# Patient Record
Sex: Female | Born: 1940
Health system: Southern US, Community
[De-identification: ages and names within clinical notes are randomized; demographics above are authoritative.]

## PROBLEM LIST (undated history)

## (undated) DIAGNOSIS — J449 Chronic obstructive pulmonary disease, unspecified: Secondary | ICD-10-CM

## (undated) DIAGNOSIS — F419 Anxiety disorder, unspecified: Secondary | ICD-10-CM

## (undated) DIAGNOSIS — J309 Allergic rhinitis, unspecified: Secondary | ICD-10-CM

## (undated) DIAGNOSIS — L0390 Cellulitis, unspecified: Secondary | ICD-10-CM

## (undated) DIAGNOSIS — M779 Enthesopathy, unspecified: Secondary | ICD-10-CM

## (undated) DIAGNOSIS — K589 Irritable bowel syndrome without diarrhea: Secondary | ICD-10-CM

## (undated) DIAGNOSIS — I6529 Occlusion and stenosis of unspecified carotid artery: Secondary | ICD-10-CM

## (undated) DIAGNOSIS — J4489 Other specified chronic obstructive pulmonary disease: Secondary | ICD-10-CM

## (undated) DIAGNOSIS — G301 Alzheimer's disease with late onset: Secondary | ICD-10-CM

## (undated) DIAGNOSIS — Q525 Fusion of labia: Secondary | ICD-10-CM

## (undated) DIAGNOSIS — I70209 Unspecified atherosclerosis of native arteries of extremities, unspecified extremity: Secondary | ICD-10-CM

## (undated) DIAGNOSIS — F329 Major depressive disorder, single episode, unspecified: Secondary | ICD-10-CM

## (undated) DIAGNOSIS — E7800 Pure hypercholesterolemia, unspecified: Secondary | ICD-10-CM

## (undated) DIAGNOSIS — Z87440 Personal history of urinary (tract) infections: Secondary | ICD-10-CM

## (undated) DIAGNOSIS — E039 Hypothyroidism, unspecified: Secondary | ICD-10-CM

## (undated) DIAGNOSIS — M1712 Unilateral primary osteoarthritis, left knee: Secondary | ICD-10-CM

## (undated) DIAGNOSIS — R718 Other abnormality of red blood cells: Secondary | ICD-10-CM

## (undated) DIAGNOSIS — H9190 Unspecified hearing loss, unspecified ear: Secondary | ICD-10-CM

## (undated) DIAGNOSIS — R87629 Unspecified abnormal cytological findings in specimens from vagina: Secondary | ICD-10-CM

## (undated) DIAGNOSIS — E079 Disorder of thyroid, unspecified: Secondary | ICD-10-CM

## (undated) DIAGNOSIS — N760 Acute vaginitis: Secondary | ICD-10-CM

## (undated) DIAGNOSIS — K219 Gastro-esophageal reflux disease without esophagitis: Secondary | ICD-10-CM

## (undated) DIAGNOSIS — F32A Depression, unspecified: Secondary | ICD-10-CM

## (undated) DIAGNOSIS — M1990 Unspecified osteoarthritis, unspecified site: Secondary | ICD-10-CM

## (undated) DIAGNOSIS — E119 Type 2 diabetes mellitus without complications: Secondary | ICD-10-CM

## (undated) DIAGNOSIS — M8580 Other specified disorders of bone density and structure, unspecified site: Secondary | ICD-10-CM

## (undated) DIAGNOSIS — G4700 Insomnia, unspecified: Secondary | ICD-10-CM

## (undated) DIAGNOSIS — M109 Gout, unspecified: Secondary | ICD-10-CM

## (undated) DIAGNOSIS — I10 Essential (primary) hypertension: Secondary | ICD-10-CM

## (undated) DIAGNOSIS — R55 Syncope and collapse: Secondary | ICD-10-CM

## (undated) DIAGNOSIS — M3500 Sicca syndrome, unspecified: Secondary | ICD-10-CM

## (undated) DIAGNOSIS — Z9289 Personal history of other medical treatment: Secondary | ICD-10-CM

## (undated) HISTORY — DX: Gastro-esophageal reflux disease without esophagitis: K21.9

## (undated) HISTORY — DX: Occlusion and stenosis of unspecified carotid artery: I65.29

## (undated) HISTORY — PX: TONSILLECTOMY: SUR1361

## (undated) HISTORY — DX: Syncope and collapse: R55

## (undated) HISTORY — DX: Irritable bowel syndrome, unspecified: K58.9

## (undated) HISTORY — DX: Allergic rhinitis, unspecified: J30.9

## (undated) HISTORY — DX: Unilateral primary osteoarthritis, left knee: M17.12

## (undated) HISTORY — DX: Disorder of thyroid, unspecified: E07.9

## (undated) HISTORY — DX: Enthesopathy, unspecified: M77.9

## (undated) HISTORY — PX: CATARACT EXTRACTION, BILATERAL: SHX1313

## (undated) HISTORY — DX: Depression, unspecified: F32.A

## (undated) HISTORY — DX: Other specified disorders of bone density and structure, unspecified site: M85.80

## (undated) HISTORY — DX: Acute vaginitis: N76.0

## (undated) HISTORY — DX: Hypothyroidism, unspecified: E03.9

## (undated) HISTORY — DX: Unspecified atherosclerosis of native arteries of extremities, unspecified extremity: I70.209

## (undated) HISTORY — DX: Chronic obstructive pulmonary disease, unspecified: J44.9

## (undated) HISTORY — DX: Other specified chronic obstructive pulmonary disease: J44.89

## (undated) HISTORY — DX: Personal history of urinary (tract) infections: Z87.440

## (undated) HISTORY — DX: Unspecified osteoarthritis, unspecified site: M19.90

## (undated) HISTORY — PX: CHOLECYSTECTOMY: SHX55

## (undated) HISTORY — DX: Fusion of labia: Q52.5

## (undated) HISTORY — PX: EYE SURGERY: SHX253

## (undated) HISTORY — DX: Anxiety disorder, unspecified: F41.9

## (undated) HISTORY — DX: Sjogren syndrome, unspecified: M35.00

## (undated) HISTORY — DX: Gout, unspecified: M10.9

## (undated) HISTORY — DX: Pure hypercholesterolemia, unspecified: E78.00

## (undated) HISTORY — DX: Unspecified abnormal cytological findings in specimens from vagina: R87.629

## (undated) HISTORY — DX: Major depressive disorder, single episode, unspecified: F32.9

## (undated) HISTORY — DX: Cellulitis, unspecified: L03.90

## (undated) HISTORY — DX: Other abnormality of red blood cells: R71.8

## (undated) HISTORY — DX: Essential (primary) hypertension: I10

## (undated) HISTORY — DX: Insomnia, unspecified: G47.00

## (undated) HISTORY — PX: FOOT SURGERY: SHX648

## (undated) HISTORY — PX: KNEE SURGERY: SHX244

## (undated) HISTORY — DX: Type 2 diabetes mellitus without complications: E11.9

---

## 1998-11-21 ENCOUNTER — Ambulatory Visit (HOSPITAL_COMMUNITY): Admission: RE | Admit: 1998-11-21 | Discharge: 1998-11-21 | Payer: Self-pay | Admitting: Obstetrics & Gynecology

## 1999-01-09 ENCOUNTER — Other Ambulatory Visit: Admission: RE | Admit: 1999-01-09 | Discharge: 1999-01-09 | Payer: Self-pay | Admitting: Obstetrics and Gynecology

## 1999-02-10 ENCOUNTER — Other Ambulatory Visit: Admission: RE | Admit: 1999-02-10 | Discharge: 1999-02-10 | Payer: Self-pay | Admitting: Obstetrics and Gynecology

## 1999-11-19 ENCOUNTER — Encounter: Payer: Self-pay | Admitting: Internal Medicine

## 1999-11-19 ENCOUNTER — Encounter: Admission: RE | Admit: 1999-11-19 | Discharge: 1999-11-19 | Payer: Self-pay | Admitting: Internal Medicine

## 2000-03-03 ENCOUNTER — Other Ambulatory Visit: Admission: RE | Admit: 2000-03-03 | Discharge: 2000-03-03 | Payer: Self-pay | Admitting: Obstetrics and Gynecology

## 2000-05-10 ENCOUNTER — Encounter: Admission: RE | Admit: 2000-05-10 | Discharge: 2000-08-08 | Payer: Self-pay | Admitting: Internal Medicine

## 2001-01-02 ENCOUNTER — Encounter: Admission: RE | Admit: 2001-01-02 | Discharge: 2001-04-02 | Payer: Self-pay | Admitting: Internal Medicine

## 2001-03-13 ENCOUNTER — Other Ambulatory Visit: Admission: RE | Admit: 2001-03-13 | Discharge: 2001-03-13 | Payer: Self-pay | Admitting: Obstetrics and Gynecology

## 2001-03-13 ENCOUNTER — Encounter (INDEPENDENT_AMBULATORY_CARE_PROVIDER_SITE_OTHER): Payer: Self-pay | Admitting: Specialist

## 2002-05-14 ENCOUNTER — Other Ambulatory Visit: Admission: RE | Admit: 2002-05-14 | Discharge: 2002-05-14 | Payer: Self-pay | Admitting: Obstetrics and Gynecology

## 2002-06-15 ENCOUNTER — Ambulatory Visit (HOSPITAL_COMMUNITY): Admission: RE | Admit: 2002-06-15 | Discharge: 2002-06-15 | Payer: Self-pay | Admitting: Internal Medicine

## 2003-05-16 ENCOUNTER — Other Ambulatory Visit: Admission: RE | Admit: 2003-05-16 | Discharge: 2003-05-16 | Payer: Self-pay | Admitting: Obstetrics and Gynecology

## 2004-05-18 ENCOUNTER — Other Ambulatory Visit: Admission: RE | Admit: 2004-05-18 | Discharge: 2004-05-18 | Payer: Self-pay | Admitting: Obstetrics and Gynecology

## 2004-07-07 ENCOUNTER — Encounter (HOSPITAL_BASED_OUTPATIENT_CLINIC_OR_DEPARTMENT_OTHER): Admission: RE | Admit: 2004-07-07 | Discharge: 2004-10-05 | Payer: Self-pay | Admitting: Internal Medicine

## 2004-12-08 ENCOUNTER — Ambulatory Visit (HOSPITAL_COMMUNITY): Admission: RE | Admit: 2004-12-08 | Discharge: 2004-12-08 | Payer: Self-pay | Admitting: Oncology

## 2005-06-17 ENCOUNTER — Other Ambulatory Visit: Admission: RE | Admit: 2005-06-17 | Discharge: 2005-06-17 | Payer: Self-pay | Admitting: Obstetrics and Gynecology

## 2005-10-06 ENCOUNTER — Ambulatory Visit: Payer: Self-pay | Admitting: Pulmonary Disease

## 2005-11-05 ENCOUNTER — Ambulatory Visit: Payer: Self-pay | Admitting: Pulmonary Disease

## 2006-02-22 ENCOUNTER — Ambulatory Visit: Payer: Self-pay | Admitting: Pulmonary Disease

## 2006-03-09 ENCOUNTER — Ambulatory Visit: Payer: Self-pay | Admitting: Pulmonary Disease

## 2007-05-10 ENCOUNTER — Encounter: Admission: RE | Admit: 2007-05-10 | Discharge: 2007-05-10 | Payer: Self-pay | Admitting: Family Medicine

## 2007-05-11 ENCOUNTER — Other Ambulatory Visit: Admission: RE | Admit: 2007-05-11 | Discharge: 2007-05-11 | Payer: Self-pay | Admitting: Obstetrics and Gynecology

## 2009-03-18 ENCOUNTER — Emergency Department (HOSPITAL_COMMUNITY): Admission: EM | Admit: 2009-03-18 | Discharge: 2009-03-18 | Payer: Self-pay | Admitting: Emergency Medicine

## 2009-04-18 ENCOUNTER — Encounter (HOSPITAL_BASED_OUTPATIENT_CLINIC_OR_DEPARTMENT_OTHER): Admission: RE | Admit: 2009-04-18 | Discharge: 2009-05-09 | Payer: Self-pay | Admitting: Internal Medicine

## 2009-06-23 ENCOUNTER — Ambulatory Visit: Payer: Self-pay | Admitting: Obstetrics and Gynecology

## 2009-06-23 ENCOUNTER — Other Ambulatory Visit: Admission: RE | Admit: 2009-06-23 | Discharge: 2009-06-23 | Payer: Self-pay | Admitting: Obstetrics and Gynecology

## 2009-06-23 ENCOUNTER — Encounter: Payer: Self-pay | Admitting: Obstetrics and Gynecology

## 2009-06-30 ENCOUNTER — Ambulatory Visit: Payer: Self-pay | Admitting: Obstetrics and Gynecology

## 2009-07-17 ENCOUNTER — Ambulatory Visit: Payer: Self-pay | Admitting: Obstetrics and Gynecology

## 2010-02-26 ENCOUNTER — Ambulatory Visit: Payer: Self-pay | Admitting: Obstetrics and Gynecology

## 2010-03-12 ENCOUNTER — Ambulatory Visit: Payer: Self-pay | Admitting: Obstetrics and Gynecology

## 2010-03-19 ENCOUNTER — Ambulatory Visit: Payer: Self-pay | Admitting: Obstetrics and Gynecology

## 2010-05-18 ENCOUNTER — Ambulatory Visit: Payer: Self-pay | Admitting: Women's Health

## 2010-06-24 ENCOUNTER — Ambulatory Visit: Payer: Self-pay | Admitting: Obstetrics and Gynecology

## 2010-07-16 ENCOUNTER — Ambulatory Visit: Payer: Self-pay | Admitting: Obstetrics and Gynecology

## 2010-08-20 ENCOUNTER — Ambulatory Visit: Payer: Self-pay | Admitting: Obstetrics and Gynecology

## 2011-01-22 ENCOUNTER — Ambulatory Visit (INDEPENDENT_AMBULATORY_CARE_PROVIDER_SITE_OTHER): Payer: Medicare Other | Admitting: Obstetrics and Gynecology

## 2011-01-22 DIAGNOSIS — R350 Frequency of micturition: Secondary | ICD-10-CM

## 2011-01-22 DIAGNOSIS — N898 Other specified noninflammatory disorders of vagina: Secondary | ICD-10-CM

## 2011-01-22 DIAGNOSIS — R82998 Other abnormal findings in urine: Secondary | ICD-10-CM

## 2011-01-22 DIAGNOSIS — B373 Candidiasis of vulva and vagina: Secondary | ICD-10-CM

## 2011-02-26 ENCOUNTER — Other Ambulatory Visit: Payer: Medicare Other

## 2011-03-16 ENCOUNTER — Other Ambulatory Visit (INDEPENDENT_AMBULATORY_CARE_PROVIDER_SITE_OTHER): Payer: Medicare Other

## 2011-03-16 DIAGNOSIS — N390 Urinary tract infection, site not specified: Secondary | ICD-10-CM

## 2011-03-31 ENCOUNTER — Other Ambulatory Visit: Payer: Self-pay | Admitting: Internal Medicine

## 2011-03-31 DIAGNOSIS — M5416 Radiculopathy, lumbar region: Secondary | ICD-10-CM

## 2011-04-05 ENCOUNTER — Ambulatory Visit
Admission: RE | Admit: 2011-04-05 | Discharge: 2011-04-05 | Disposition: A | Discharge status: Home / Self Care | Payer: Medicare Other | Source: Ambulatory Visit | Attending: Internal Medicine | Admitting: Internal Medicine

## 2011-04-05 DIAGNOSIS — M5416 Radiculopathy, lumbar region: Secondary | ICD-10-CM

## 2011-04-20 NOTE — Consult Note (Signed)
NAME:  Christina Sellers, Christina Sellers NO.:  [id number]   MEDICAL RECORD NO.:  [id number]          PATIENT TYPE:  REC   LOCATION:  FOOT                         FACILITY:  [facility]   PHYSICIAN:  Barry Dienes. Eloise Harman, M.D.DATE OF BIRTH:  Sep 20, 1941   DATE OF CONSULTATION:  DATE OF DISCHARGE:                                 CONSULTATION   INDICATION FOR CONSULTATION:  Persistent ulcers on the left leg.   HISTORY OF PRESENT ILLNESS:  The patient is a 70 year old white woman  with a fairly unremarkable medical history who tripped while going  upstairs approximately 5 weeks ago sustaining abrasions to the anterior  mid left leg.  She was initially seen at [facility] where they  butterfly taped the skin tear and prescribed a short course of  doxycycline.  Despite this and applying gauze to the wound once daily,  she had a moderate amount of oozing with foul odor.  She had some  tenderness around this area, but no fever or chills.  She was seen by  her primary care physician who extended the course of doxycycline and  continued topical wound care.  She has no history of DVT and no known  history of atherosclerotic peripheral vascular disease or tobacco abuse.   PAST MEDICAL HISTORY:  1. Hypertension.  2. Hypothyroidism.  3. Depression.  4. Hyperlipidemia.  5. Osteoarthritis.   ALLERGIES:  No known drug allergies.   PAST SURGICAL HISTORY:  In 1946, tonsillectomy; in 1980, open  cholecystectomy; in 2004, arthroscopy; and in 2008, bilateral cataract  operations.   MEDICATIONS AT THE TIME OF VISIT:  1. Diovan HCT 160/12.5 one tablets p.o. daily.  2. Lexapro 5 mg p.o. daily.  3. Crestor 5 mg p.o. daily.  4. Levothyroxine 100 mcg p.o. daily.  5. Voltaren 75 mg p.o. daily p.r.n. pain.   FAMILY HISTORY:  Noncontributory.   SOCIAL HISTORY:  She is widowed and has no history of tobacco abuse.  She has approximately two glasses of wine per evening.   REVIEW OF SYSTEMS:  She has not  had fever or chills.  She has chronic  mild dyspnea on exertion with exercise.  She denies productive cough or  substernal chest pain or palpitations or GI system problems.  Again, she  has no history of DVT or pulmonary embolism.   INITIAL PHYSICAL EXAMINATION:  VITAL SIGNS:  Blood pressure 102/65,  pulse 61, respirations 18, and temperature 97.5.  GENERAL:  She is a mildly overweight white woman who was in no apparent  distress while sitting upright.  HEENT:  Head, eyes, ears, nose, and throat exam was within normal  limits.  NECK:  Supple without jugular venous distention or carotid bruit.  CHEST:  Clear to auscultation.  HEART:  Regular rate and rhythm without significant murmur or gallop.  ABDOMEN:  Normal bowel sounds and no tenderness.  EXTREMITIES:  She had bilateral trace ankle edema.  She had prominent  superficial varicose veins in both legs and along the anterolateral and  proximal left leg, there was a rubber-like consistency subcutaneous lump  that seemed most likely due to superficial venous thrombosis.  On the  medial aspect of her left leg and thigh, there were no palpable venous  cords and no tenderness with palpation.  The left pedal pulses were not  palpable.  She had ABI ultrasound exam done, which returned at 0.97 on  the left and 1.03 on the right.  SKIN:  Significant for two ulcers on the anterior left leg.  Wound #1 is the more proximal left leg ulcer on the anterior aspect.  It  measures 0.2 cm x 0.2 cm x 0.1 cm.  This ulcer does not have significant  eschar but has minimal necrotic debris with a red wound base and pink  granulation tissue.  There was no exposed bone, tendon, or muscle and  mild odor.  There was no significant drainage.  There was mild erythema  around the wound.  Wound #2:  This is located on the distal and anterior left leg and  measures 0.9 cm x 0.4 cm x 0.1 cm.  This wound does not have significant  eschar but has minimal necrotic debris  with a red wound base and pink  granulation tissue.  There was no exposed bone, tendon, or muscle.  There was mild odor and scant serous discharge with intact periwound  integrity.   ASSESSMENT:  Slow-healing small ulcers on the anterior left leg.  It  appears that the slowed healing his due to mixed vascular disease  including some degree of atherosclerotic arterial peripheral vascular  disease as well as chronic venous insufficiency from incontinent venous  drainage.  I am a bit reluctant to apply tight compression wrap to her  leg despite the results on the ABI exam as she likely has small vessel  disease.   PLAN:  After the ulcers were cleaned with soap and water, 5% lidocaine  gel was applied to them to obtain topical anesthesia.  A #15 scalpel  blade was used to gently debride the necrotic tissue from the ulcers.  This was accomplished without significant discomfort for the patient or  bleeding.  The minimal bleeding was stopped with local pressure.  Following this, we applied silver-coated dressing covered by Anola Gurney  covered by a bulky gauze dressing.  Around the ulcers, a zinc-based  protective ointment was applied.  We will plan on changing the dressings  every Friday by the patient and every Tuesday here.  We have requested a  physician reevaluation in approximately 1 week.           ______________________________  Barry Dienes. Eloise Harman, M.D.     DGP/MEDQ  D:  04/22/2009  T:  04/23/2009  Job:  [id number]   cc:   Candyce Churn, M.D.

## 2011-04-20 NOTE — Assessment & Plan Note (Signed)
[facility]   NAME:  Christina Sellers, Christina Sellers           ACCOUNT NO.:  [id number]   MEDICAL RECORD NO.:  [id number]      DATE OF BIRTH:  20-Aug-1941   PHYSICIAN:  Barry Dienes. Eloise Harman, M.D.     VISIT DATE:                                   OFFICE VISIT   SUBJECTIVE:  The patient is a 70 year old Caucasian woman who is seen  for reevaluation of persistent ulcers on the anterior left leg after an  injury associated with tripping.  At her last visit, these ulcers were  debrided and she began treatment with silver coated dressing, covered by  Anola Gurney, covered by bulky dressing changed every Friday by the patient  and every Tuesday here.  She has not had significant drainage on these  dressings and has not had significant pain in the leg.  She has not had  any fever or chills.   OBJECTIVE:  VITAL SIGNS:  Blood pressure 143/71, pulse 73, respirations  21, temperature 97.7.   The condition of the wounds is as follows:  Wound #1:  It is located on the proximal anterior left leg and measures  0.1 cm x 0.1 cm x 0.1 cm.  This wound does have partial coverage with  eschar and minimal necrotic debris.  The wound base is red in color with  some pink granulation tissue.  There is no exposed bone, tendon, or  muscle and no foul odor.  There was a small amount of serous drainage  and an intact periwound integrity.  Wound #2:  This is located on the distal anterior left leg and measures  0.5 cm x 0.3 cm x 0.1 cm.  This wound is fully covered with eschar and  has minimal necrotic debris.  The wound base is red in color and there  was no significant granulation tissue.  There was no exposed bone,  tendon, or muscle and no foul odor.  There was no significant drainage.  The periwound integrity was intact.   ASSESSMENT:  Interval near-complete resolution of traumatic ulcers on  the left leg.  There remains a small amount of necrotic tissue with  eschar.  In the past, it was quite difficult  to obtain adequate  anesthesia for debridement.  Her leg has somewhat cool skin and  nonpalpable pedal pulses.  She likely has some underlying  atherosclerotic peripheral vascular disease in addition to venous  insufficiency.   PLAN:  A #15 scalpel blade was used to gently debride the necrotic  tissue around the eschar.  This was accomplished without significant  bleeding and only minimal discomfort.  I have advised her to continue  treatment to these eschars with soap and water daily, covered by either  a Band-Aid or nonstick gauze.  She was advised to call us if her wound  does not gradually heal over the next 4-8 weeks.  She states that she is  considering bunion surgery on the left and I advised her that should she  do this, that it would be in her best interest to have an evaluation of  her arterial blood flow prior to this type of elective surgery such as  an ankle toe arterial ultrasound index.           ______________________________  Barry Dienes  Eloise Harman, M.D.     DGP/MEDQ  D:  04/29/2009  T:  04/30/2009  Job:  [id number]   cc:   Candyce Churn, M.D.

## 2011-04-23 NOTE — Consult Note (Signed)
NAME:  Christina Sellers, Christina Sellers NO.:  [id number]   MEDICAL RECORD NO.:  [id number]                   PATIENT TYPE:  REC   LOCATION:  FOOT                                 FACILITY:  [facility]   PHYSICIAN:  Jonelle Sports. Sevier, M.D.              DATE OF BIRTH:  02/11/1941   DATE OF CONSULTATION:  07/09/2004  DATE OF DISCHARGE:                                   CONSULTATION   HISTORY:  This 70 year old white female was referred at the courtesy of Dr.  Thurston Hole for assistance with management of a chronic traumatic wound to the  left lower extremity.   The patient has in the past been told of some circulatory insufficiency in  her lower extremities, but this has never been a point of any clinical  concern. On June 22, she was visiting at [facility] and apparently fell,  injuring her mid pretibial area on the left against the edge of a concrete  step. There was no concern apparently about any foreign body having entered  the wound. Subsequent x-ray done by Dr. Thurston Hole approximately a week ago  showed no bone injury, no suspicion of osteomyelitis, and no foreign bodies.  She was treated initially with Neosporin and bandaged and was given a round  of Spectracef for five days in mid July. One week ago, she saw Dr. Thurston Hole  for the problem, and her dressing was changed to a wet-to-dry normal saline  on b.i.d. basis, and she was given Omnicef 300 mg twice daily which was  completed on the day prior to her visit here. She has continued to have  considerable pain and some pinprick sensation in the area.   She was referred here by Dr. Thurston Hole that we might provide continued  surveillance until this wound heels.   PAST MEDICAL HISTORY:  The patient also has hypertension. She has had a  cholecystectomy and left knee surgery in the past.   ALLERGIES:  She is allergic to CELEXA.   MEDICATIONS:  Her regular medications include:  1. Diovan HCT.  2. Claritin.  3. Flonase.  4.  Prilosec.  5. Voltaren SR.  6. Baby aspirin.  7. Advair.  8. Glucosamine.  9. Vitamin E.  10.      Calcium carbonate.   PHYSICAL EXAMINATION:  Examination today is limited ot the distal lower  extremities. The patient's right lower extremity is largely unremarkable  except for slightly diminished pulses and will not further described. The  left lower extremity is also largely unremarkable with no significant  deformities with pulses everywhere palpable though diminished with intact  sensation to monofilament testing and with skin temperature which are  normal. There is no significant edema.   On that left side in the mid to distal pretibial area is a healing area of  skin avulsion which is roughly superficial and which measures 20 x 10 x 0.5  mm. The base of this is  reasonably granular and clean, and there is some  surrounding hardened debris secondary to seepage of the wound.   IMPRESSION:  Traumatic lesion, left pretibial area, healing slowly but  satisfactorily.   DISPOSITION:  1. The areas of crust are debrided away from the margins of the wound, and     the wound base at this point looks fairly clean.  2. Her dressing is changed today to the application of a Silvalon pad     overlaying with Adaptic and then a Mepilex dressing pad. The patient is     instructed to leave this in place until she sees [place] again six days hence.   The patient is cautioned that should she experience significant increase in  pain or inflammatory symptoms, fevers, chills, or anything that might  indicate return of infection (which is not apparently present at the moment)  that she should report this to use immediately.   Otherwise, she is to leave her dressing in place until seen here in six days  and is advised to use a double layer of Saran wrap over that portion of that  extremity for showering.                                               Jonelle Sports. Cheryll Cockayne, M.D.    RES/MEDQ  D:  07/09/2004   T:  07/09/2004  Job:  [id number]   cc:   Candyce Churn, M.D.  [address] [place]  [place] [address]  Fax: [phone]   Elana Alm. Thurston Hole, M.D.  [address]  [place] [address]  Fax: [phone]

## 2011-06-16 ENCOUNTER — Encounter: Payer: Self-pay | Admitting: Anesthesiology

## 2011-08-25 ENCOUNTER — Emergency Department (HOSPITAL_COMMUNITY): Payer: Medicare Other

## 2011-08-25 ENCOUNTER — Observation Stay (HOSPITAL_COMMUNITY)
Admission: EM | Admit: 2011-08-25 | Discharge: 2011-08-26 | Disposition: A | Discharge status: Home / Self Care | Payer: Medicare Other | Attending: Internal Medicine | Admitting: Internal Medicine

## 2011-08-25 DIAGNOSIS — Z23 Encounter for immunization: Secondary | ICD-10-CM | POA: Insufficient documentation

## 2011-08-25 DIAGNOSIS — F3289 Other specified depressive episodes: Secondary | ICD-10-CM | POA: Insufficient documentation

## 2011-08-25 DIAGNOSIS — R079 Chest pain, unspecified: Principal | ICD-10-CM | POA: Insufficient documentation

## 2011-08-25 DIAGNOSIS — K219 Gastro-esophageal reflux disease without esophagitis: Secondary | ICD-10-CM | POA: Insufficient documentation

## 2011-08-25 DIAGNOSIS — F411 Generalized anxiety disorder: Secondary | ICD-10-CM | POA: Insufficient documentation

## 2011-08-25 DIAGNOSIS — I739 Peripheral vascular disease, unspecified: Secondary | ICD-10-CM | POA: Insufficient documentation

## 2011-08-25 DIAGNOSIS — I10 Essential (primary) hypertension: Secondary | ICD-10-CM | POA: Insufficient documentation

## 2011-08-25 DIAGNOSIS — E039 Hypothyroidism, unspecified: Secondary | ICD-10-CM | POA: Insufficient documentation

## 2011-08-25 DIAGNOSIS — J449 Chronic obstructive pulmonary disease, unspecified: Secondary | ICD-10-CM | POA: Insufficient documentation

## 2011-08-25 DIAGNOSIS — F329 Major depressive disorder, single episode, unspecified: Secondary | ICD-10-CM | POA: Insufficient documentation

## 2011-08-25 DIAGNOSIS — J4489 Other specified chronic obstructive pulmonary disease: Secondary | ICD-10-CM | POA: Insufficient documentation

## 2011-08-25 LAB — HEMOGLOBIN A1C
Hgb A1c MFr Bld: 6.3 % — ABNORMAL HIGH (ref <5.7)
Mean Plasma Glucose: 134 mg/dL — ABNORMAL HIGH (ref <117)

## 2011-08-25 LAB — BASIC METABOLIC PANEL
CO2: 25 mEq/L (ref 19–32)
Calcium: 9.9 mg/dL (ref 8.4–10.5)
Chloride: 108 mEq/L (ref 96–112)
Glucose, Bld: 135 mg/dL — ABNORMAL HIGH (ref 70–99)
Potassium: 4.2 mEq/L (ref 3.5–5.1)
Sodium: 142 mEq/L (ref 135–145)

## 2011-08-25 LAB — CARDIAC PANEL(CRET KIN+CKTOT+MB+TROPI)
CK, MB: 2.1 ng/mL (ref 0.3–4.0)
CK, MB: 1.9 ng/mL (ref 0.3–4.0)
Relative Index: INVALID (ref 0.0–2.5)
Total CK: 51 U/L (ref 7–177)
Troponin I: <0.3 ng/mL (ref <0.30)

## 2011-08-25 LAB — DIFFERENTIAL
Basophils Absolute: 0 10*3/uL (ref 0.0–0.1)
Eosinophils Relative: 2 % (ref 0–5)
Lymphocytes Relative: 23 % (ref 12–46)
Lymphs Abs: 1.7 10*3/uL (ref 0.7–4.0)
Neutro Abs: 4.8 10*3/uL (ref 1.7–7.7)

## 2011-08-25 LAB — POCT I-STAT TROPONIN I

## 2011-08-25 LAB — CBC
HCT: 38.9 % (ref 36.0–46.0)
Hemoglobin: 13.4 g/dL (ref 12.0–15.0)
MCV: 94.6 fL (ref 78.0–100.0)
RBC: 4.11 MIL/uL (ref 3.87–5.11)
WBC: 7.4 10*3/uL (ref 4.0–10.5)

## 2011-08-26 LAB — LIPID PANEL
HDL: 37 mg/dL — ABNORMAL LOW (ref >39)
LDL Cholesterol: 73 mg/dL (ref 0–99)
Triglycerides: 195 mg/dL — ABNORMAL HIGH (ref <150)
VLDL: 39 mg/dL (ref 0–40)

## 2011-08-26 LAB — CARDIAC PANEL(CRET KIN+CKTOT+MB+TROPI)
Relative Index: INVALID (ref 0.0–2.5)
Total CK: 59 U/L (ref 7–177)
Troponin I: <0.3 ng/mL (ref <0.30)

## 2011-08-26 LAB — COMPREHENSIVE METABOLIC PANEL
ALT: 16 U/L (ref 0–35)
AST: 19 U/L (ref 0–37)
Albumin: 3.8 g/dL (ref 3.5–5.2)
Calcium: 9.9 mg/dL (ref 8.4–10.5)
Chloride: 108 mEq/L (ref 96–112)
Creatinine, Ser: 0.99 mg/dL (ref 0.50–1.10)
Sodium: 140 mEq/L (ref 135–145)

## 2011-08-26 LAB — CBC
Hemoglobin: 11.8 g/dL — ABNORMAL LOW (ref 12.0–15.0)
MCV: 93.6 fL (ref 78.0–100.0)
Platelets: 167 10*3/uL (ref 150–400)
RBC: 3.73 MIL/uL — ABNORMAL LOW (ref 3.87–5.11)
WBC: 7.8 10*3/uL (ref 4.0–10.5)

## 2011-08-26 NOTE — Discharge Summary (Signed)
NAME:  Christina Sellers, Christina Sellers NO.:  [id number]  MEDICAL RECORD NO.:  [id number]  LOCATION:  3731                         FACILITY:  [facility]  PHYSICIAN:  Pearla Dubonnet, M.D.DATE OF BIRTH:  [birth year]  DATE OF ADMISSION:  08/25/2011 DATE OF DISCHARGE:  08/26/2011                              DISCHARGE SUMMARY   DISCHARGE DIAGNOSES: 1. Chest pain syndrome, likely noncardiac with serial cardiac enzymes     negative. 2. History of gastroesophageal reflux disease with recent reduction in     PPI therapy with possible esophageal spasm. 3. History of hypertension. 4. History depression. 5. History of anxiety. 6. History of degenerative joint disease of the left knee. 7. Hypothyroidism. 8. Irritable bowel syndrome. 9. Allergic rhinitis with chronic sinusitis. 10.Insomnia. 11.Peripheral vascular disease with atherosclerotic plaque in the     aorta and iliac vessels on CT scan in 2008. 12.Elevated MCV secondary to alcohol use. 13.History of abnormal Pap smears followed by Dr. Edyth Gunnels. 14.Right external carotid artery stenosis. 15.Cervical degenerative joint disease. 16.Chronic obstructive pulmonary disease with mild asthma. 17.Degenerative joint disease of the hands. 18.Left hand tremor for many years. 19.Gout. 20.Hyperlipidemia. 21.B12 deficiency.  ALLERGIES:  SULFAMETHOXAZOLE, TRIMETHOPRIM causes hives.  DISCHARGE MEDICATIONS: 1. Nitroglycerin 0.4 mg sublingual q. 5 minutes for chest pain     unrelieved with rest, may take up to three doses over 15 minutes. 2. Omeprazole 20 mg twice daily. 3. Tums 1 tablet by mouth daily as needed for indigestion. 4. Celebrex 200 mg daily. 5. Colchicine 0.6 mg every other day. 6. Crestor 10 mg 1-1/2 tablet daily. 7. Diovan 160 mg daily. 8. Flonase nasal spray, 1 spray nasally in each nostril as needed. 9. Levothyroxine 112 mcg daily. 10.Uloric 40 mg 1-1/2 tablet daily. 11.Vitamin B12 1000 mcg tablet one  daily. 12.Vitamin D3 2000 units daily. 13.Zyrtec 10 mg 1-1/2 tablet daily at bedtime.  CONSULTATIONS:  Cardiology, Dr. Clydie Braun, August 25, 2011.  DISCHARGE LABORATORIES:  Cardiac enzymes x3 were within normal limits with CK maximum of 59 and CK-MB maximum of 2.1.  Troponins were negative x3, all less than 0.30.  Lipid profile revealed cholesterol 149, triglyceride 195, HDL 37, LDL 73, with a ratio 4.0.  CMET was within normal limits with creatinine of 0.99 and BUN of 24.  Glucose was 96. Hemoglobin A1c during this admission was slightly elevated at 6.3% and TSH was borderline elevated at 5.232.  EKG on admission within normal limits and EKG on the morning of August 26, 2011, 4:40 a.m. reveals normal sinus rhythm with nonspecific T-wave abnormality.  No ST-segment depression or elevation. Actual seizure intervals were normal.  Chest x-ray performed on August 25, 2011, revealed no acute disease.  Heart size was normal. Lungs were clear.  HOSPITAL COURSE:  Christina Sellers is a pleasant 70 year old female who experienced substernal chest pain radiating to both shoulders and her back that lasted about an hour and half.  She was given a sublingual nitroglycerin after calling EMS on the way into the hospital and after 20-30 minutes her chest pain completely resolved.  She did have some radiation into her jaw she reports.  She did not have diaphoresis or shortness of breath.  She has reduced  her PPI therapy to every other day trying to take the minimum or effective dose for GERD.  Certainly possible this could have been an esophageal spasm.  She was monitored for 24 hours and had no further chest pains, which resolved after nitroglycerin yesterday.  She will be discharged home in improved condition with no chest pain and our plans are to follow with Dr. Clydie Braun next week for stress testing to rule out coronary artery disease.  She will be discharged with  nitroglycerin.  She does take a Celebrex daily and will not ask her to take aspirin.  We will increase her Prilosec as mentioned to b.i.d. for 1 week and then reduce back to once daily after that.  Our plan is to see her back in the office in 2-3 weeks and she has been told to call us immediately if she has any recurrent chest pain.  CONDITION ON DISCHARGE:  Improved.  Time spent performing chart review, examining the patient, follow with the patient, and perform discharge summary was 35 minutes.     Pearla Dubonnet, M.D.     RNG/MEDQ  D:  08/26/2011  T:  08/26/2011  Job:  [id number]  Electronically Signed by Marden Noble M.D. on 08/26/2011 03:46:35 PM

## 2011-09-01 NOTE — Consult Note (Signed)
NAME:  AMIREE, NO NO.:  [id number]  MEDICAL RECORD NO.:  [id number]  LOCATION:  3731                         FACILITY:  [facility]  PHYSICIAN:  Corky Crafts, MDDATE OF BIRTH:  1941/10/09  DATE OF CONSULTATION:  08/25/2011 DATE OF DISCHARGE:                                CONSULTATION   PRIMARY CARE PHYSICIAN:  Pearla Dubonnet, MD  REFERRING PHYSICIAN:  Pearla Dubonnet, MD  REASON FOR CONSULTATION:  Chest discomfort.  HISTORY OF PRESENT ILLNESS:  The patient is a 70 year old woman with hypertension, high cholesterol, who was in her usual state of health up until this morning.  At about 7:30, she was awoken from sleep with right arm pain, a minute later it moved in to her chest.  It did not feel like indigestion.  She describes it as a heaviness.  Chest pain then moved to her back and subsequently to her jaw.  She was lying in bed.  Basically, the entire time the pain was moving.  She then decided to get up and get dressed, pain continued.  There was no associated sweating, nausea, vomiting, or lightheadedness.  She was most concerned by the pain in her jaw.  It lasted about an hour total.  After about 30 minutes of symptom, she called EMS.  She was told that chew on 4 aspirin.  EMS then gave her 1 sublingual nitroglycerin.  About 30 minutes later, the pain was gone, pain has not come back since that time.  She does not exercise much due to orthopedic problems.  She does walk on flat surfaces, for example in the grocery store, she does not have any problems with this.  She has never had any type of chest pain like this before.  PAST MEDICAL HISTORY: 1. Hypertension. 2. High cholesterol. 3. Gout. 4. Irritable bowel syndrome. 5. Chronic kidney disease. 6. Hypothyroidism. 7. Vascular necrosis involving the distal left tibia.  ALLERGIES:  No known drug allergies.  MEDICATIONS AT HOME: 1. Calcium and vitamin E. 2. Omeprazole 20 mg  daily. 3. Synthroid 112 mcg daily. 4. Celebrex 200 mg daily. 5. Uloric 40 mg daily. 6. Zyrtec 10 mg daily. 7. Colcrys. 8. Nitrofurantoin. 9. Crestor 10 mg daily. 10.Vitamin B12. 11.Vitamin D3. 12.She had been on Diovan, but this was recently stopped.  SOCIAL HISTORY:  She does not smoke.  She does not drink.  She rarely drinks caffeine.  She currently does not work.  FAMILY HISTORY:  No early coronary artery disease.  PAST SURGICAL HISTORY:  Tonsillectomy, cataract surgery, gallbladder removal, and left knee surgery.  REVIEW OF SYSTEMS:  Significant for the symptoms noted above.  No bleeding problems, no focal weakness.  No rash.  All other systems negative.  PHYSICAL EXAMINATION:  VITAL SIGNS:  Blood pressure 122/68, heart rate is 66. GENERAL:  She is awake, alert, and in no apparent distress. HEENT:  Head, normocephalic, atraumatic.  Eyes, extraocular movements intact. NECK:  No JVD.  No carotid bruits. CARDIOVASCULAR:  Regular rate and rhythm, S1 and S2. LUNGS:  Clear to auscultation bilaterally. ABDOMEN:  Soft, nontender, nondistended. EXTREMITIES:  No edema. NEUROLOGIC:  No focal motor or sensory deficits. SKIN:  No rash. BACK:  No kyphosis.  LAB WORK:  Negative troponin.  Creatinine 1.05.  Hemoglobin 13.4, platelets 185.  ECG showed normal sinus rhythm, nonspecific ST-T-wave flattening diffusely.  Chest x-ray showed no acute disease.  ASSESSMENT AND PLAN:  A 70 year old with several risk factors for heart disease who has now had chest pain with some atypical and some typical features.  1. Cardiac.  She will be on telemetry.  She will be ruled out for     myocardial infarction.  I do not think she needs heparin at this     time since her enzymes have been negative.  If she rules in, would     plan for cardiac catheterization.  If she rules out and has no more     symptoms, we will likely plan for outpatient stress test.  The     patient is in agreement.  She  would like to avoid invasive testing     if possible. 2. Hypertension.  She has been off her Diovan, but wants to talk to     Dr. Kevan Ny about going back on this medication. 3. Crestor for high cholesterol. 4. We will follow.     Corky Crafts, MD    JSV/MEDQ  D:  08/25/2011  T:  08/25/2011  Job:  [id number]  Electronically Signed by Lance Muss MD on 09/01/2011 01:12:43 PM

## 2011-09-20 NOTE — H&P (Signed)
NAME:  Christina Sellers, Christina Sellers NO.:  [id number]  MEDICAL RECORD NO.:  [id number]  LOCATION:  [facility]                         FACILITY:  [facility]  PHYSICIAN:  Clydia Llano, MD       DATE OF BIRTH:  24-Sep-1941  DATE OF ADMISSION:  08/25/2011 DATE OF DISCHARGE:                             HISTORY & PHYSICAL   PRIMARY CARE PHYSICIAN:  Christina Dubonnet, MD  REASON FOR ADMISSION:  Chest pain.  Christina Sellers is a 70 year old Caucasian female with past medical history of hypertension and hypercholesterolemia.  The patient came into the hospital complaining about chest pain.  The patient woke up this morning about 8:30 with right shoulder pain.  The pain was like 2/10. The patient soon developed mid chest pressure 5/10 with no radiation, not associated with any symptoms, did not remember anything increases or decreases the pain.  While she is having this pain, the patient said she felt like there is different pain in the back or radiation she could not tell for sure that was more severe than the pain in the front was 7- 8/10.  These symptoms were not associated with any shortness of breath. No nausea, no diaphoresis.  The patient called EMS and brought to the hospital, on route she was given nitroglycerin.  The patient felt the pain is improving after the nitroglycerin and then the pain lasted for about one and a half hours.  The patient did have history of gastroesophageal reflux, but fell this is different.  Denies any similar episodes.  Denies any history of heart attacks.  PAST MEDICAL HISTORY: 1. Hypertension. 2. Hypercholesterolemia. 3. Gastroesophageal reflux disease. 4. Hypothyroidism. 5. Irritable bowel syndrome. 6. Osteoarthritis.  PAST SURGICAL HISTORY:  Status post cholecystectomy, knee surgery and tonsillectomy.  SOCIAL HISTORY:  Patient ex-smoker, quit smoking in 1983.  She used to smoke two and a half packs per day.  No alcohol or recreational  drug use.  FAMILY HISTORY:  The patient denies family history of heart attacks or diabetes.  Has a brother had a heart attack in his 44s.  ALLERGIES:  NKDA.  MEDICATIONS:  Calcium carbonate, Celebrex, Crestor, Diovan, Flonase, meclizine, Synthroid, vitamin B12, vitamin D3, Valium, Voltaren and Zyrtec.  REVIEW OF SYSTEMS:  GENERAL:  Denies any fever, denies chills.  EYES: Denies discharge or redness.  ENT:  Denies nasal congestion, rhinorrhea. CARDIOVASCULAR:  Positive for chest pain.  Denies syncope and dyspnea. RESPIRATION:  Denies cough, wheezing.  GI: Denies vomiting, abdominal pain.  MUSCULOSKELETAL:  Denies back and neck pain.  SKIN:  Denies rash, abrasion or laceration.  PSYCHIATRY:  Denies nasal changes.  PHYSICAL EXAMINATION:  VITALS:  Temperature is 97.8, respirations 18, pulse is 78, blood pressure is 132/68, O2 sats 98% on room air. GENERAL:  The patient awake, alert, oriented x3, in no acute distress, wearing hospital gown, lying on her back, appear very comfortable. HEENT:  Head and face normocephalic, atraumatic.  Eyes: Pupils equal, reactive to light and accommodation.  ENT: Normal.  Mouth  without oral thrush or lesions. NECK:  Supple.  No meningeal signs.  No JVD appreciated. CARDIOVASCULAR:  Regular rate and rhythm.  No murmurs, rubs or gallops. ABDOMEN:  Bowel sounds  heard.  Soft, nontender or distended. RESPIRATORY:  Clear to auscultation bilaterally. EXTREMITIES:  Nontender.  There is no pedal edema. NEURO:  Alert, awake, oriented x3.  Motor intact in all extremities.  No sensorium deficits.  Cranial nerves II-XII grossly intact. SKIN:  Color normal.  No rash.  No petechiae. PSYCHIATRIC:  Awake, alert and no abnormality of mood or affect.  RADIOLOGY: 1. EKG showed normal sinus rhythm with left axis deviation.  There is     nonspecific ST and T-wave changes.  No prior EKG for comparison. 2. Chest x-ray showed no active disease.  LABORATORY DATA: 1. CBC,  WBC 7.9, hemoglobin 13.4, hematocrit 38.9, platelets 185. 2. BMP, sodium 142, potassium 4.2, chloride 108, bicarb is 25, glucose     135, BUN is 26.  Creatinine is 1.60, calcium 9.9.  ASSESSMENT/PLAN: 1. Chest pain.  Chest pain seems atypical started in the right     shoulder and then she had for 2 hours, started in the right     shoulder.  The patient will be admitted to the hospital to rule out     acute coronary syndrome with three sets of cardiac enzymes.  EKG in     the morning.  The patient reported not to have stress test for the     past 10 years.  But the patient does seem she is not high risk,     likely she can have the stress test as outpatient, but Dr. Kevan Ny     will see in the morning and thus will be up to him for the stress     test in outpatient versus inpatient. 2. Dyslipidemia.  Fasting lipid profile be obtained as well as     continue her medications. 3. Gastroesophageal reflux disease.  The patient was on PPI.  We will     restart that as Protonix 40 mg, I do not think this is what caused     the chest pain likely it is musculoskeletal. 4. Hypertension controlled.     Clydia Llano, MD  ME/MEDQ  D:  08/25/2011  T:  08/25/2011  Job:  [id number]  cc:   Christina Sellers, M.D.  Electronically Signed by Clydia Llano  on 09/20/2011 01:49:27 PM

## 2011-10-11 ENCOUNTER — Encounter: Payer: Self-pay | Admitting: Obstetrics and Gynecology

## 2011-10-11 ENCOUNTER — Ambulatory Visit (INDEPENDENT_AMBULATORY_CARE_PROVIDER_SITE_OTHER): Payer: Medicare Other | Admitting: Obstetrics and Gynecology

## 2011-10-11 ENCOUNTER — Other Ambulatory Visit (HOSPITAL_COMMUNITY)
Admission: RE | Admit: 2011-10-11 | Discharge: 2011-10-11 | Disposition: A | Discharge status: Home / Self Care | Payer: Medicare Other | Source: Ambulatory Visit | Attending: Obstetrics and Gynecology | Admitting: Obstetrics and Gynecology

## 2011-10-11 VITALS — BP 134/74 | Ht 60.0 in | Wt 171.0 lb

## 2011-10-11 DIAGNOSIS — M949 Disorder of cartilage, unspecified: Secondary | ICD-10-CM

## 2011-10-11 DIAGNOSIS — N390 Urinary tract infection, site not specified: Secondary | ICD-10-CM

## 2011-10-11 DIAGNOSIS — M8580 Other specified disorders of bone density and structure, unspecified site: Secondary | ICD-10-CM

## 2011-10-11 DIAGNOSIS — Z124 Encounter for screening for malignant neoplasm of cervix: Secondary | ICD-10-CM | POA: Insufficient documentation

## 2011-10-11 DIAGNOSIS — Q525 Fusion of labia: Secondary | ICD-10-CM

## 2011-10-11 DIAGNOSIS — Q519 Congenital malformation of uterus and cervix, unspecified: Secondary | ICD-10-CM

## 2011-10-11 DIAGNOSIS — N952 Postmenopausal atrophic vaginitis: Secondary | ICD-10-CM

## 2011-10-11 NOTE — Progress Notes (Signed)
Subjective:     Patient ID: Christina Sellers, female   DOB: [birth year], 70 y.o.   MRN: [id number]  HPIpatient came back to see me today for further followup. She is aware of her labial fusion and says it does bother her with bathing. She is having no vaginal bleeding. She does have vaginal dryness but does not require treatment. She is having no pelvic pain. She is due for both a mammogram and a bone density. She is stable osteopenia without an elevated FRAX risk. She takes calcium and vitamin D. She's had no fractures. She is at higher risk however because of aseptic necrosis of left ankle. We have treated her for chronic UTIs. We referred her to Dr. Patsi Sears and she now takes Macrobid daily. Since she started that she's had no further UTIs.   Review of Systems  Constitutional: Negative.   HENT: Negative.   Eyes:       Recently had eye surgery to remove lower eyelashes.  Respiratory: Negative.   Cardiovascular:       Hyperlipidemia. Hypertension  Gastrointestinal:       GERD  Genitourinary:       Chronic UTIs  Musculoskeletal:       Gout, aseptic necrosis of left lower extremity  Skin: Negative.   Neurological: Positive for dizziness.  Hematological: Negative.   Psychiatric/Behavioral: Negative.        Objective:   Physical ExamI.Physical examination: Kennon Portela present HEENT within normal limits. Neck: Thyroid not large. No masses. Supraclavicular nodes: not enlarged. Breasts: Examined in both sitting midline position. No skin changes and no masses. Abdomen: Soft no guarding rebound or masses or hernia. Pelvic: External: 4+ labial fusion. BUS: Within normal limits. Vaginal:within normal limits. Poor estrogen effect. No evidence of cystocele rectocele or enterocele. Cervix: clean. Uterus: Normal size and shape. Adnexa: No masses. Rectovaginal exam: Confirmatory and negative. Extremities: Within normal limits.     Assessment:     #1. Labial fusion #2. Atrophic vaginitis #3.  Osteopenia #4. Chronic UTIs    Plan:    Estrace cream for labial fusion, mammogram, and bone density. Continue Macrobid that Dr. Patsi Sears start her on.

## 2011-11-04 ENCOUNTER — Encounter: Payer: Self-pay | Admitting: Obstetrics and Gynecology

## 2012-01-04 ENCOUNTER — Other Ambulatory Visit: Payer: Self-pay | Admitting: *Deleted

## 2012-01-04 DIAGNOSIS — M8580 Other specified disorders of bone density and structure, unspecified site: Secondary | ICD-10-CM

## 2012-01-06 DIAGNOSIS — H04129 Dry eye syndrome of unspecified lacrimal gland: Secondary | ICD-10-CM | POA: Diagnosis not present

## 2012-01-06 DIAGNOSIS — R7989 Other specified abnormal findings of blood chemistry: Secondary | ICD-10-CM | POA: Diagnosis not present

## 2012-01-06 DIAGNOSIS — Z79899 Other long term (current) drug therapy: Secondary | ICD-10-CM | POA: Diagnosis not present

## 2012-01-06 DIAGNOSIS — R7309 Other abnormal glucose: Secondary | ICD-10-CM | POA: Diagnosis not present

## 2012-01-06 DIAGNOSIS — M545 Low back pain: Secondary | ICD-10-CM | POA: Diagnosis not present

## 2012-01-06 DIAGNOSIS — M109 Gout, unspecified: Secondary | ICD-10-CM | POA: Diagnosis not present

## 2012-01-06 DIAGNOSIS — M1710 Unilateral primary osteoarthritis, unspecified knee: Secondary | ICD-10-CM | POA: Diagnosis not present

## 2012-01-10 DIAGNOSIS — H26499 Other secondary cataract, unspecified eye: Secondary | ICD-10-CM | POA: Diagnosis not present

## 2012-01-12 DIAGNOSIS — E28319 Asymptomatic premature menopause: Secondary | ICD-10-CM | POA: Diagnosis not present

## 2012-01-13 ENCOUNTER — Other Ambulatory Visit: Payer: Self-pay | Admitting: Obstetrics and Gynecology

## 2012-01-13 DIAGNOSIS — M8580 Other specified disorders of bone density and structure, unspecified site: Secondary | ICD-10-CM

## 2012-01-17 DIAGNOSIS — H6980 Other specified disorders of Eustachian tube, unspecified ear: Secondary | ICD-10-CM | POA: Diagnosis not present

## 2012-01-17 DIAGNOSIS — J019 Acute sinusitis, unspecified: Secondary | ICD-10-CM | POA: Diagnosis not present

## 2012-02-04 ENCOUNTER — Telehealth: Payer: Self-pay | Admitting: *Deleted

## 2012-02-04 NOTE — Telephone Encounter (Signed)
Tell pt bone density was stable and she did not have a high enough risk of fracture to recommend treatment. If she has any other questions let me know.

## 2012-02-04 NOTE — Telephone Encounter (Signed)
(  pt aware that you are out of office) Pt called requesting her recent bone density results from [facility].

## 2012-02-07 NOTE — Telephone Encounter (Signed)
I am not sure why she said this. Please ask radiologist who read bone density to call me today.

## 2012-02-07 NOTE — Telephone Encounter (Signed)
Spoke with pt about the below note and she was okay with this. But she did say that the person that did her bone density took a x-ray of her left arm stating that the doctor would want a picture of this? Pt asked would this be something related to her bone density this is why she was concerned about the report. Please advise

## 2012-02-07 NOTE — Telephone Encounter (Signed)
Left message for pt to call.

## 2012-02-09 NOTE — Telephone Encounter (Signed)
Spoke with pt about the below note and she is okay with this.

## 2012-02-14 ENCOUNTER — Encounter: Payer: Self-pay | Admitting: Obstetrics and Gynecology

## 2012-04-12 DIAGNOSIS — E039 Hypothyroidism, unspecified: Secondary | ICD-10-CM | POA: Diagnosis not present

## 2012-04-12 DIAGNOSIS — R7309 Other abnormal glucose: Secondary | ICD-10-CM | POA: Diagnosis not present

## 2012-04-12 DIAGNOSIS — H04129 Dry eye syndrome of unspecified lacrimal gland: Secondary | ICD-10-CM | POA: Diagnosis not present

## 2012-04-12 DIAGNOSIS — M545 Low back pain: Secondary | ICD-10-CM | POA: Diagnosis not present

## 2012-04-12 DIAGNOSIS — R7989 Other specified abnormal findings of blood chemistry: Secondary | ICD-10-CM | POA: Diagnosis not present

## 2012-04-12 DIAGNOSIS — M109 Gout, unspecified: Secondary | ICD-10-CM | POA: Diagnosis not present

## 2012-04-12 DIAGNOSIS — R6889 Other general symptoms and signs: Secondary | ICD-10-CM | POA: Diagnosis not present

## 2012-04-12 DIAGNOSIS — M1710 Unilateral primary osteoarthritis, unspecified knee: Secondary | ICD-10-CM | POA: Diagnosis not present

## 2012-04-17 ENCOUNTER — Ambulatory Visit (INDEPENDENT_AMBULATORY_CARE_PROVIDER_SITE_OTHER): Payer: Medicare Other | Admitting: Obstetrics and Gynecology

## 2012-04-17 DIAGNOSIS — N898 Other specified noninflammatory disorders of vagina: Secondary | ICD-10-CM

## 2012-04-17 DIAGNOSIS — B9689 Other specified bacterial agents as the cause of diseases classified elsewhere: Secondary | ICD-10-CM

## 2012-04-17 DIAGNOSIS — A499 Bacterial infection, unspecified: Secondary | ICD-10-CM | POA: Diagnosis not present

## 2012-04-17 DIAGNOSIS — N760 Acute vaginitis: Secondary | ICD-10-CM | POA: Diagnosis not present

## 2012-04-17 LAB — WET PREP FOR TRICH, YEAST, CLUE
Trich, Wet Prep: NONE SEEN
Yeast Wet Prep HPF POC: NONE SEEN

## 2012-04-17 MED ORDER — METRONIDAZOLE 0.75 % VA GEL: VAGINAL | Status: DC

## 2012-04-17 NOTE — Progress Notes (Signed)
Patient came to see me today with a 3 day history of vulvar itching and swelling. She is used Gyne-Lotrimin with some success. Today she noticed an extremely heavy vaginal discharge with a disagreeable odor. She has now been on Macrodantin for one year for suppression of frequent UTIs by Dr. Patsi Sears. She has not had a UTI since she's been on it.  Exam: Kennon Portela present. External: Patient still has labial fusion but no lesions. Vaginal exam: Profuse vaginal discharge positive for amine, clue cells and bacteria but negative for yeast. No lesions and vagina. Cervix is clean.  Assessment: Bacterial vaginosis with probable yeast  Plan: MetroGel vaginal cream one applicatorful at bedtime in the vagina for 5 days. Continue Gyne-Lotrimin. Discussed stopping Macrodantin as patient would like to. I told her I did not object to that. She will discuss with Dr. Patsi Sears.

## 2012-04-19 DIAGNOSIS — N3020 Other chronic cystitis without hematuria: Secondary | ICD-10-CM | POA: Diagnosis not present

## 2012-04-27 ENCOUNTER — Ambulatory Visit (INDEPENDENT_AMBULATORY_CARE_PROVIDER_SITE_OTHER): Payer: Medicare Other | Admitting: Obstetrics and Gynecology

## 2012-04-27 DIAGNOSIS — N898 Other specified noninflammatory disorders of vagina: Secondary | ICD-10-CM

## 2012-04-27 DIAGNOSIS — R300 Dysuria: Secondary | ICD-10-CM | POA: Diagnosis not present

## 2012-04-27 DIAGNOSIS — L293 Anogenital pruritus, unspecified: Secondary | ICD-10-CM | POA: Diagnosis not present

## 2012-04-27 LAB — URINALYSIS W MICROSCOPIC + REFLEX CULTURE
Glucose, UA: NEGATIVE mg/dL
Hgb urine dipstick: NEGATIVE
Nitrite: POSITIVE — AB
Protein, ur: 30 mg/dL — AB
pH: 5 (ref 5.0–8.0)

## 2012-04-27 LAB — WET PREP FOR TRICH, YEAST, CLUE
Trich, Wet Prep: NONE SEEN
Yeast Wet Prep HPF POC: NONE SEEN

## 2012-04-27 MED ORDER — NYSTATIN-TRIAMCINOLONE 100000-0.1 UNIT/GM-% EX OINT: TOPICAL_OINTMENT | Freq: Two times a day (BID) | CUTANEOUS | Status: DC

## 2012-04-27 NOTE — Progress Notes (Signed)
The patient came back to see me today for further followup. We saw her recently and treated her for bacterial vaginosis with MetroGel vaginal cream. It completely eliminated the discharge. When she come to see me that day she was also complaining of vulvar itching and had been using Gyne-Lotrimin cream. She thought it had helped some and I told her to continue to use it. She said however her symptoms have continued. They include vulvar irritation and burning when urine gets to the end of the urethra. She is stopped her antibiotic which  Dr. Patsi Sears was giving  her for suppression of UTIs. She has been off of for one week.  Exam: Kennon Portela present. External: Some vulvitis present. Patient continues to have labial fusion which has been present for years. Vaginal exam: Wet prep negative except for significant bacteria. Urinalysis shows 11-20 white blood cells and bacteria.  Assessment: Vulvitis. Possible urinary tract infection.  Plan: Mycolog ointment twice a day. Urine culture.

## 2012-04-29 LAB — URINE CULTURE: Colony Count: 4000

## 2012-06-15 DIAGNOSIS — H10509 Unspecified blepharoconjunctivitis, unspecified eye: Secondary | ICD-10-CM | POA: Diagnosis not present

## 2012-07-10 DIAGNOSIS — L84 Corns and callosities: Secondary | ICD-10-CM | POA: Diagnosis not present

## 2012-07-10 DIAGNOSIS — M2010 Hallux valgus (acquired), unspecified foot: Secondary | ICD-10-CM | POA: Diagnosis not present

## 2012-08-30 DIAGNOSIS — L821 Other seborrheic keratosis: Secondary | ICD-10-CM | POA: Diagnosis not present

## 2012-08-30 DIAGNOSIS — L259 Unspecified contact dermatitis, unspecified cause: Secondary | ICD-10-CM | POA: Diagnosis not present

## 2012-08-30 DIAGNOSIS — L219 Seborrheic dermatitis, unspecified: Secondary | ICD-10-CM | POA: Diagnosis not present

## 2012-08-30 DIAGNOSIS — L570 Actinic keratosis: Secondary | ICD-10-CM | POA: Diagnosis not present

## 2012-09-19 DIAGNOSIS — Z23 Encounter for immunization: Secondary | ICD-10-CM | POA: Diagnosis not present

## 2012-10-16 DIAGNOSIS — M109 Gout, unspecified: Secondary | ICD-10-CM | POA: Diagnosis not present

## 2012-10-16 DIAGNOSIS — R7309 Other abnormal glucose: Secondary | ICD-10-CM | POA: Diagnosis not present

## 2012-10-16 DIAGNOSIS — E039 Hypothyroidism, unspecified: Secondary | ICD-10-CM | POA: Diagnosis not present

## 2012-10-16 DIAGNOSIS — H04129 Dry eye syndrome of unspecified lacrimal gland: Secondary | ICD-10-CM | POA: Diagnosis not present

## 2012-10-16 DIAGNOSIS — E7800 Pure hypercholesterolemia, unspecified: Secondary | ICD-10-CM | POA: Diagnosis not present

## 2012-10-16 DIAGNOSIS — I10 Essential (primary) hypertension: Secondary | ICD-10-CM | POA: Diagnosis not present

## 2012-10-16 DIAGNOSIS — Z0000 Encounter for general adult medical examination without abnormal findings: Secondary | ICD-10-CM | POA: Diagnosis not present

## 2012-10-16 DIAGNOSIS — Z1331 Encounter for screening for depression: Secondary | ICD-10-CM | POA: Diagnosis not present

## 2012-10-30 ENCOUNTER — Ambulatory Visit (INDEPENDENT_AMBULATORY_CARE_PROVIDER_SITE_OTHER): Payer: Medicare Other | Admitting: Obstetrics and Gynecology

## 2012-10-30 ENCOUNTER — Encounter: Payer: Self-pay | Admitting: Obstetrics and Gynecology

## 2012-10-30 VITALS — BP 120/66 | Ht 59.0 in | Wt 167.0 lb

## 2012-10-30 DIAGNOSIS — N952 Postmenopausal atrophic vaginitis: Secondary | ICD-10-CM | POA: Diagnosis not present

## 2012-10-30 DIAGNOSIS — M949 Disorder of cartilage, unspecified: Secondary | ICD-10-CM | POA: Diagnosis not present

## 2012-10-30 DIAGNOSIS — Q5270 Unspecified congenital malformations of vulva: Secondary | ICD-10-CM | POA: Diagnosis not present

## 2012-10-30 DIAGNOSIS — Q519 Congenital malformation of uterus and cervix, unspecified: Secondary | ICD-10-CM

## 2012-10-30 DIAGNOSIS — R82998 Other abnormal findings in urine: Secondary | ICD-10-CM

## 2012-10-30 DIAGNOSIS — R8290 Unspecified abnormal findings in urine: Secondary | ICD-10-CM

## 2012-10-30 DIAGNOSIS — M899 Disorder of bone, unspecified: Secondary | ICD-10-CM

## 2012-10-30 DIAGNOSIS — M8580 Other specified disorders of bone density and structure, unspecified site: Secondary | ICD-10-CM

## 2012-10-30 DIAGNOSIS — Q525 Fusion of labia: Secondary | ICD-10-CM | POA: Insufficient documentation

## 2012-10-30 LAB — URINALYSIS W MICROSCOPIC + REFLEX CULTURE
Hgb urine dipstick: NEGATIVE
Ketones, ur: NEGATIVE mg/dL
Leukocytes, UA: NEGATIVE
Nitrite: NEGATIVE
Protein, ur: NEGATIVE mg/dL
Urobilinogen, UA: 0.2 mg/dL (ref 0.0–1.0)
pH: 5 (ref 5.0–8.0)

## 2012-10-30 NOTE — Patient Instructions (Signed)
Schedule mammogram.

## 2012-10-30 NOTE — Progress Notes (Signed)
And came to see me today for further follow up. When we saw her in May she saw her a very dense agreeable discharge when she voided. She however thought it was from her vagina. Her urinalysis then  was normal. We treated her for vulvitis and the discharge disappeared although she is seen in several times since. The last time was 2 months ago. She is having no vaginal bleeding. She is having no pelvic pain. She does have  labial fusion and used to use  topical estrogen cream. She stopped it as it Was not appearing  to be improving but was not getting worse. She is not sexually active. She is able to void without difficulty in spite of the fusion. She does have osteopenia. Her last bone density was 2013. Her fracture risk was not elevated. Her bone density was stable. She's had no fractures. She is getting ready to schedule her mammogram. She gets cold easily but does not have hot flashes. She has never had an abnormal Pap smear. Her last Pap smear was 2012. Her urinalysis today was normal.  ROS: 12 system review done. Pertinent positives above.Other positives include hypertension, arthritis and hyperlipidemia.  Physical examination: HEENT within normal limits. Neck: Thyroid not large. No masses. Supraclavicular nodes: not enlarged. Breasts: Examined in both sitting and lying  position. No skin changes and no masses. Abdomen: Soft no guarding rebound or masses or hernia. Pelvic: External: partial labial fusion. BUS: Within normal limits. Vaginal:within normal limits. Poor  estrogen effect. No evidence of cystocele rectocele or enterocele. Cervix: clean. Uterus: Normal size and shape. Adnexa: No masses. Rectovaginal exam: Confirmatory and negative. Extremities: Within normal limits.  Assessment: #1. Labial fusion #2. Osteopenia #3. Atrophic vaginitis #4. This Disagreeable vaginal odor.  Plan: Estrace vaginal cream 3 times a week for labial fusion. Mammogram. Periodic bone densities. Pap not done.The new  Pap smear guidelines were discussed with the patient.

## 2012-11-06 DIAGNOSIS — Z1231 Encounter for screening mammogram for malignant neoplasm of breast: Secondary | ICD-10-CM | POA: Diagnosis not present

## 2012-11-06 DIAGNOSIS — Z803 Family history of malignant neoplasm of breast: Secondary | ICD-10-CM | POA: Diagnosis not present

## 2012-11-07 ENCOUNTER — Encounter: Payer: Self-pay | Admitting: Women's Health

## 2012-11-07 DIAGNOSIS — M1710 Unilateral primary osteoarthritis, unspecified knee: Secondary | ICD-10-CM | POA: Diagnosis not present

## 2012-11-13 ENCOUNTER — Encounter: Payer: Self-pay | Admitting: Women's Health

## 2012-11-13 ENCOUNTER — Ambulatory Visit (INDEPENDENT_AMBULATORY_CARE_PROVIDER_SITE_OTHER): Payer: Medicare Other | Admitting: Women's Health

## 2012-11-13 VITALS — BP 142/70

## 2012-11-13 DIAGNOSIS — R350 Frequency of micturition: Secondary | ICD-10-CM

## 2012-11-13 LAB — URINALYSIS W MICROSCOPIC + REFLEX CULTURE
Bilirubin Urine: NEGATIVE
Glucose, UA: NEGATIVE mg/dL
Ketones, ur: NEGATIVE mg/dL
Specific Gravity, Urine: 1.025 (ref 1.005–1.030)
pH: 5.5 (ref 5.0–8.0)

## 2012-11-13 NOTE — Progress Notes (Signed)
Patient ID: Christina Sellers, female   DOB: 1941-04-09, 71 y.o.   MRN: [id number] Presents with complaint of not feeling well with several vague symptoms and questions if she has a UTI. Denies change in frequency, pain or burning. States is feeling shaky, lightheaded, slightly anxious, and reports blood pressure has been higher than its normal 120/70.  Denies a fever, headache, head congestion, change in medication, vaginal discharge,  nausea/vomiting or alcohol consumption. States was not able to get appointment at her primary care office today.  Exam: Blood pressure 142/70. Appears well. UA: Completely negative.  General malaise Hypertension on medication  Plan: Instructed to schedule followup with primary care. Continue to monitor blood pressure and report findings to primary care. Urine culture pending. Offered glucose screening, declined states normal in the past. Reviewed importance of eating healthy diet with mix of protein and carbs, decrease simple sugars/ carbs.

## 2012-11-15 DIAGNOSIS — M1710 Unilateral primary osteoarthritis, unspecified knee: Secondary | ICD-10-CM | POA: Diagnosis not present

## 2012-11-15 DIAGNOSIS — H9319 Tinnitus, unspecified ear: Secondary | ICD-10-CM | POA: Diagnosis not present

## 2012-11-15 DIAGNOSIS — R42 Dizziness and giddiness: Secondary | ICD-10-CM | POA: Diagnosis not present

## 2012-11-15 DIAGNOSIS — I10 Essential (primary) hypertension: Secondary | ICD-10-CM | POA: Diagnosis not present

## 2012-12-13 DIAGNOSIS — H9319 Tinnitus, unspecified ear: Secondary | ICD-10-CM | POA: Diagnosis not present

## 2012-12-13 DIAGNOSIS — H903 Sensorineural hearing loss, bilateral: Secondary | ICD-10-CM | POA: Diagnosis not present

## 2012-12-26 DIAGNOSIS — H04129 Dry eye syndrome of unspecified lacrimal gland: Secondary | ICD-10-CM | POA: Diagnosis not present

## 2012-12-26 DIAGNOSIS — H02059 Trichiasis without entropian unspecified eye, unspecified eyelid: Secondary | ICD-10-CM | POA: Diagnosis not present

## 2012-12-29 ENCOUNTER — Other Ambulatory Visit: Payer: Self-pay | Admitting: Dermatology

## 2012-12-29 DIAGNOSIS — L570 Actinic keratosis: Secondary | ICD-10-CM | POA: Diagnosis not present

## 2012-12-29 DIAGNOSIS — L723 Sebaceous cyst: Secondary | ICD-10-CM | POA: Diagnosis not present

## 2012-12-29 DIAGNOSIS — D485 Neoplasm of uncertain behavior of skin: Secondary | ICD-10-CM | POA: Diagnosis not present

## 2013-01-04 DIAGNOSIS — D485 Neoplasm of uncertain behavior of skin: Secondary | ICD-10-CM | POA: Diagnosis not present

## 2013-01-04 DIAGNOSIS — L570 Actinic keratosis: Secondary | ICD-10-CM | POA: Diagnosis not present

## 2013-01-04 DIAGNOSIS — L723 Sebaceous cyst: Secondary | ICD-10-CM | POA: Diagnosis not present

## 2013-01-08 DIAGNOSIS — L089 Local infection of the skin and subcutaneous tissue, unspecified: Secondary | ICD-10-CM | POA: Diagnosis not present

## 2013-04-09 DIAGNOSIS — K1370 Unspecified lesions of oral mucosa: Secondary | ICD-10-CM | POA: Diagnosis not present

## 2013-04-09 DIAGNOSIS — H04129 Dry eye syndrome of unspecified lacrimal gland: Secondary | ICD-10-CM | POA: Diagnosis not present

## 2013-04-09 DIAGNOSIS — M1710 Unilateral primary osteoarthritis, unspecified knee: Secondary | ICD-10-CM | POA: Diagnosis not present

## 2013-04-09 DIAGNOSIS — M545 Low back pain: Secondary | ICD-10-CM | POA: Diagnosis not present

## 2013-04-09 DIAGNOSIS — M79609 Pain in unspecified limb: Secondary | ICD-10-CM | POA: Diagnosis not present

## 2013-04-09 DIAGNOSIS — I10 Essential (primary) hypertension: Secondary | ICD-10-CM | POA: Diagnosis not present

## 2013-04-09 DIAGNOSIS — H9319 Tinnitus, unspecified ear: Secondary | ICD-10-CM | POA: Diagnosis not present

## 2013-04-09 DIAGNOSIS — R42 Dizziness and giddiness: Secondary | ICD-10-CM | POA: Diagnosis not present

## 2013-05-09 DIAGNOSIS — H04129 Dry eye syndrome of unspecified lacrimal gland: Secondary | ICD-10-CM | POA: Diagnosis not present

## 2013-05-17 DIAGNOSIS — Q828 Other specified congenital malformations of skin: Secondary | ICD-10-CM | POA: Diagnosis not present

## 2013-05-17 DIAGNOSIS — L84 Corns and callosities: Secondary | ICD-10-CM | POA: Diagnosis not present

## 2013-05-17 DIAGNOSIS — M779 Enthesopathy, unspecified: Secondary | ICD-10-CM | POA: Diagnosis not present

## 2013-05-17 DIAGNOSIS — M216X9 Other acquired deformities of unspecified foot: Secondary | ICD-10-CM | POA: Diagnosis not present

## 2013-06-06 ENCOUNTER — Encounter: Payer: Self-pay | Admitting: Gynecology

## 2013-06-06 ENCOUNTER — Ambulatory Visit (INDEPENDENT_AMBULATORY_CARE_PROVIDER_SITE_OTHER): Payer: Medicare Other | Admitting: Gynecology

## 2013-06-06 DIAGNOSIS — N816 Rectocele: Secondary | ICD-10-CM

## 2013-06-06 DIAGNOSIS — N8111 Cystocele, midline: Secondary | ICD-10-CM

## 2013-06-06 DIAGNOSIS — Q519 Congenital malformation of uterus and cervix, unspecified: Secondary | ICD-10-CM

## 2013-06-06 DIAGNOSIS — Q525 Fusion of labia: Secondary | ICD-10-CM

## 2013-06-06 DIAGNOSIS — N952 Postmenopausal atrophic vaginitis: Secondary | ICD-10-CM

## 2013-06-06 DIAGNOSIS — Q524 Other congenital malformations of vagina: Secondary | ICD-10-CM | POA: Diagnosis not present

## 2013-06-06 NOTE — Progress Notes (Signed)
The patient presents having noticed over the past several months some pressure sensation after bowel movement. She now notices as if something is pushing out of the vagina that lasts for an hour or so after a bowel movement and then resolves. She's not having any pain. Does note constipation. Has a history of labial agglutination.  Exam was Administrator, [employer] Service vagina with atrophic changes. Upper labia minora agglutinated to the mid level. Vagina grossly normal. Mild cystocele. First to second degree rectocele with straining. Cervix high in the vault normal. Bimanual without gross masses or tenderness. Rectovaginal exam confirms first to second-degree rectocele.  Assessment and plan: Pelvic relaxation with mild cystocele mild to moderate rectocele. Labial agglutination. Reviewed with patient options include observation, trial of pessary which I think would be difficult given the small introital opening with her agglutination, surgery to include posterior colporrhaphy possible anterior-posterior colporrhaphy. This point patient is not interested in doing anything she is not overly bothered by this situation. She'll continue to monitor and follow up with me if it becomes progressive and she wants to perceive with treatment. She is not bothered by the labial agglutination titer and nothing further needs to be done at this time.  Note: This document was prepared with a combination of digital dictation and possible smart phrase technology. Any transcriptional errors that result from this process are unintentional.

## 2013-06-06 NOTE — Patient Instructions (Signed)
Followup when you're due for your annual exam. Sooner if any issues. 

## 2013-06-11 DIAGNOSIS — M1710 Unilateral primary osteoarthritis, unspecified knee: Secondary | ICD-10-CM | POA: Diagnosis not present

## 2013-07-18 DIAGNOSIS — L439 Lichen planus, unspecified: Secondary | ICD-10-CM | POA: Diagnosis not present

## 2013-07-26 DIAGNOSIS — M7750 Other enthesopathy of unspecified foot: Secondary | ICD-10-CM | POA: Diagnosis not present

## 2013-08-17 DIAGNOSIS — L821 Other seborrheic keratosis: Secondary | ICD-10-CM | POA: Diagnosis not present

## 2013-08-17 DIAGNOSIS — L905 Scar conditions and fibrosis of skin: Secondary | ICD-10-CM | POA: Diagnosis not present

## 2013-08-17 DIAGNOSIS — L570 Actinic keratosis: Secondary | ICD-10-CM | POA: Diagnosis not present

## 2013-09-10 DIAGNOSIS — Z23 Encounter for immunization: Secondary | ICD-10-CM | POA: Diagnosis not present

## 2013-10-12 ENCOUNTER — Encounter (HOSPITAL_COMMUNITY): Payer: Self-pay | Admitting: Emergency Medicine

## 2013-10-12 ENCOUNTER — Emergency Department (HOSPITAL_COMMUNITY)
Admission: EM | Admit: 2013-10-12 | Discharge: 2013-10-12 | Disposition: A | Discharge status: Home / Self Care | Payer: Medicare Other | Attending: Emergency Medicine | Admitting: Emergency Medicine

## 2013-10-12 DIAGNOSIS — R404 Transient alteration of awareness: Secondary | ICD-10-CM | POA: Diagnosis not present

## 2013-10-12 DIAGNOSIS — E079 Disorder of thyroid, unspecified: Secondary | ICD-10-CM | POA: Diagnosis not present

## 2013-10-12 DIAGNOSIS — E7800 Pure hypercholesterolemia, unspecified: Secondary | ICD-10-CM | POA: Diagnosis not present

## 2013-10-12 DIAGNOSIS — Z87440 Personal history of urinary (tract) infections: Secondary | ICD-10-CM | POA: Diagnosis not present

## 2013-10-12 DIAGNOSIS — R55 Syncope and collapse: Secondary | ICD-10-CM

## 2013-10-12 DIAGNOSIS — Z791 Long term (current) use of non-steroidal anti-inflammatories (NSAID): Secondary | ICD-10-CM | POA: Diagnosis not present

## 2013-10-12 DIAGNOSIS — R42 Dizziness and giddiness: Secondary | ICD-10-CM | POA: Diagnosis not present

## 2013-10-12 DIAGNOSIS — Z8742 Personal history of other diseases of the female genital tract: Secondary | ICD-10-CM | POA: Insufficient documentation

## 2013-10-12 DIAGNOSIS — R112 Nausea with vomiting, unspecified: Secondary | ICD-10-CM | POA: Diagnosis not present

## 2013-10-12 DIAGNOSIS — Z87891 Personal history of nicotine dependence: Secondary | ICD-10-CM | POA: Diagnosis not present

## 2013-10-12 DIAGNOSIS — Z79899 Other long term (current) drug therapy: Secondary | ICD-10-CM | POA: Insufficient documentation

## 2013-10-12 DIAGNOSIS — M899 Disorder of bone, unspecified: Secondary | ICD-10-CM | POA: Diagnosis not present

## 2013-10-12 DIAGNOSIS — R1110 Vomiting, unspecified: Secondary | ICD-10-CM | POA: Insufficient documentation

## 2013-10-12 DIAGNOSIS — I10 Essential (primary) hypertension: Secondary | ICD-10-CM | POA: Insufficient documentation

## 2013-10-12 HISTORY — DX: Syncope and collapse: R55

## 2013-10-12 LAB — URINALYSIS, ROUTINE W REFLEX MICROSCOPIC
Bilirubin Urine: NEGATIVE
Glucose, UA: NEGATIVE mg/dL
Hgb urine dipstick: NEGATIVE
Ketones, ur: NEGATIVE mg/dL
Leukocytes, UA: NEGATIVE
Protein, ur: NEGATIVE mg/dL
pH: 6 (ref 5.0–8.0)

## 2013-10-12 LAB — COMPREHENSIVE METABOLIC PANEL
ALT: 11 U/L (ref 0–35)
AST: 19 U/L (ref 0–37)
Albumin: 3.9 g/dL (ref 3.5–5.2)
Alkaline Phosphatase: 102 U/L (ref 39–117)
BUN: 29 mg/dL — ABNORMAL HIGH (ref 6–23)
Chloride: 105 mEq/L (ref 96–112)
Creatinine, Ser: 1.11 mg/dL — ABNORMAL HIGH (ref 0.50–1.10)
Potassium: 4 mEq/L (ref 3.5–5.1)
Sodium: 140 mEq/L (ref 135–145)
Total Bilirubin: 0.6 mg/dL (ref 0.3–1.2)
Total Protein: 6.8 g/dL (ref 6.0–8.3)

## 2013-10-12 LAB — CBC
HCT: 35.8 % — ABNORMAL LOW (ref 36.0–46.0)
MCHC: 34.4 g/dL (ref 30.0–36.0)
MCV: 97 fL (ref 78.0–100.0)
RDW: 13.4 % (ref 11.5–15.5)
WBC: 10 10*3/uL (ref 4.0–10.5)

## 2013-10-12 MED ORDER — SODIUM CHLORIDE 0.9 % IV BOLUS (SEPSIS)
1000.0000 mL | Freq: Once | INTRAVENOUS | Status: AC
  Administered 2013-10-12: 1000 mL via INTRAVENOUS

## 2013-10-12 NOTE — ED Notes (Signed)
Patient asked for and received ice water.  

## 2013-10-12 NOTE — ED Provider Notes (Signed)
CSN: [id number]     Arrival date & time 10/12/13  1932 History   First MD Initiated Contact with Patient 10/12/13 1935     Chief Complaint  Patient presents with  . Loss of Consciousness  . Emesis   (Consider location/radiation/quality/duration/timing/severity/associated sxs/prior Treatment) HPI..... patient was shopping downtown tonight when she felt like passing out. She slumped to the ground.  She felt very dizzy and vomited.  Brought by EMS to the emergency department. She is feeling much better now. This never happened before. No confusion, arm or leg weakness, speech impairment, facial drooping, chest pain, dyspnea.  Nothing makes symptoms better or worse.  Past Medical History  Diagnosis Date  . Osteopenia   . Recurrent vaginitis   . History of recurrent UTIs   . Hypertension   . Elevated cholesterol   . Thyroid disease   . Labial fusion    Past Surgical History  Procedure Laterality Date  . Tonsillectomy    . Cholecystectomy    . Knee surgery    . Eye surgery     Family History  Problem Relation Age of Onset  . Diabetes Maternal Aunt   . Cancer Father     Oral cancer  . Breast cancer Sister     Age 18's  . Cancer Brother     Lung cancer   History  Substance Use Topics  . Smoking status: Former Games developer  . Smokeless tobacco: Not on file  . Alcohol Use: No   OB History   Grav Para Term Preterm Abortions TAB SAB Ect Mult Living   2 2 2      1 3      Review of Systems  All other systems reviewed and are negative.    Allergies  Fluconazole and Sulfa antibiotics  Home Medications   Current Outpatient Rx  Name  Route  Sig  Dispense  Refill  . BIOTIN PO   Oral   Take 1 tablet by mouth daily.         . Calcium Carbonate-Vitamin D (CALCIUM + D PO)   Oral   Take 1 tablet by mouth daily.          . celecoxib (CELEBREX) 200 MG capsule   Oral   Take 200 mg by mouth daily.          . cevimeline (EVOXAC) 30 MG capsule   Oral   Take 30 mg by mouth  daily.         . Cholecalciferol (VITAMIN D3) 1000 UNITS CAPS   Oral   Take 1,000 mg by mouth daily.           . CycloSPORINE (RESTASIS OP)   Both Eyes   Place 1 drop into both eyes 2 (two) times daily.          . febuxostat (ULORIC) 40 MG tablet   Oral   Take 20 mg by mouth daily.          . fluocinonide gel (LIDEX) 0.05 %   Topical   Apply 1 application topically 2 (two) times daily.         Marland Kitchen levothyroxine (SYNTHROID, LEVOTHROID) 112 MCG tablet   Oral   Take 112 mcg by mouth daily before breakfast.         . losartan (COZAAR) 100 MG tablet   Oral   Take 100 mg by mouth daily.         . psyllium (HYDROCIL/METAMUCIL) 95 % PACK   Oral  Take 1 packet by mouth daily.         . rosuvastatin (CRESTOR) 5 MG tablet   Oral   Take 5 mg by mouth daily.          . vitamin E 400 UNIT capsule   Oral   Take 400 Units by mouth daily.            BP 130/54  Pulse 74  Temp(Src) 97.8 F (36.6 C) (Oral)  Resp 20  SpO2 100% Physical Exam  Nursing note and vitals reviewed. Constitutional: She is oriented to person, place, and time. She appears well-developed and well-nourished.  HENT:  Head: Normocephalic and atraumatic.  Eyes: Conjunctivae and EOM are normal. Pupils are equal, round, and reactive to light.  Neck: Normal range of motion. Neck supple.  Cardiovascular: Normal rate, regular rhythm and normal heart sounds.   Pulmonary/Chest: Effort normal and breath sounds normal.  Abdominal: Soft. Bowel sounds are normal.  Musculoskeletal: Normal range of motion.  Neurological: She is alert and oriented to person, place, and time.  Skin: Skin is warm and dry.  Psychiatric: She has a normal mood and affect.    ED Course  Procedures (including critical care time) Labs Review Labs Reviewed  CBC - Abnormal; Notable for the following:    RBC 3.69 (*)    HCT 35.8 (*)    All other components within normal limits  COMPREHENSIVE METABOLIC PANEL - Abnormal;  Notable for the following:    Glucose, Bld 106 (*)    BUN 29 (*)    Creatinine, Ser 1.11 (*)    GFR calc non Af Amer 48 (*)    GFR calc Af Amer 55 (*)    All other components within normal limits  URINALYSIS, ROUTINE W REFLEX MICROSCOPIC   Imaging Review No results found.  EKG Interpretation     Ventricular Rate:  71 PR Interval:  172 QRS Duration: 101 QT Interval:  409 QTC Calculation: 444 R Axis:   -19 Text Interpretation:  Sinus rhythm Inferior infarct, old            MDM   1. Syncope    EKG and monitor showed normal sinus rhythm. Blood pressure stable. Screening blood work was normal. Patient was ambulatory without neurological deficits. Discussed findings with the patient, her son, and her niece. I suggested that if symptoms persist, she will need a CT of her head.    Donnetta Hutching, MD 10/12/13 2119

## 2013-10-12 NOTE — ED Notes (Signed)
Pt was at dinner and started feeling flushed and light headed and states she did not pass out, but "was very close."  Pt vomited on scene and was given 4mg  IV zofran by EMS, which pt states is helping with the nausea.

## 2013-10-19 DIAGNOSIS — H04129 Dry eye syndrome of unspecified lacrimal gland: Secondary | ICD-10-CM | POA: Diagnosis not present

## 2013-10-19 DIAGNOSIS — H26499 Other secondary cataract, unspecified eye: Secondary | ICD-10-CM | POA: Diagnosis not present

## 2013-10-22 DIAGNOSIS — R55 Syncope and collapse: Secondary | ICD-10-CM | POA: Diagnosis not present

## 2013-10-22 DIAGNOSIS — M3500 Sicca syndrome, unspecified: Secondary | ICD-10-CM | POA: Diagnosis not present

## 2013-10-22 DIAGNOSIS — L439 Lichen planus, unspecified: Secondary | ICD-10-CM | POA: Diagnosis not present

## 2013-10-22 DIAGNOSIS — E7800 Pure hypercholesterolemia, unspecified: Secondary | ICD-10-CM | POA: Diagnosis not present

## 2013-10-22 DIAGNOSIS — R42 Dizziness and giddiness: Secondary | ICD-10-CM | POA: Diagnosis not present

## 2013-10-22 DIAGNOSIS — Z0000 Encounter for general adult medical examination without abnormal findings: Secondary | ICD-10-CM | POA: Diagnosis not present

## 2013-10-22 DIAGNOSIS — E039 Hypothyroidism, unspecified: Secondary | ICD-10-CM | POA: Diagnosis not present

## 2013-10-22 DIAGNOSIS — Z1331 Encounter for screening for depression: Secondary | ICD-10-CM | POA: Diagnosis not present

## 2013-10-22 DIAGNOSIS — E538 Deficiency of other specified B group vitamins: Secondary | ICD-10-CM | POA: Diagnosis not present

## 2013-11-05 ENCOUNTER — Encounter: Payer: Self-pay | Admitting: Podiatry

## 2013-11-05 ENCOUNTER — Ambulatory Visit (INDEPENDENT_AMBULATORY_CARE_PROVIDER_SITE_OTHER): Payer: Medicare Other | Admitting: Podiatry

## 2013-11-05 VITALS — BP 118/54 | HR 84 | Resp 16

## 2013-11-05 DIAGNOSIS — L84 Corns and callosities: Secondary | ICD-10-CM | POA: Diagnosis not present

## 2013-11-06 NOTE — Progress Notes (Signed)
Subjective:     Patient ID: Christina Sellers, female   DOB: 09/08/1941, 72 y.o.   MRN: [id number]  HPI patient complains of lesion underneath the left over right foot that is painful   Review of Systems     Objective:   Physical Exam     Assessment:     Neurovascular status unchanged with keratotic lesion subsecond metatarsal left    Plan:     Debridement lesion with no iatrogenic bleeding noted

## 2013-11-07 DIAGNOSIS — Z1231 Encounter for screening mammogram for malignant neoplasm of breast: Secondary | ICD-10-CM | POA: Diagnosis not present

## 2013-11-07 DIAGNOSIS — Z803 Family history of malignant neoplasm of breast: Secondary | ICD-10-CM | POA: Diagnosis not present

## 2013-11-12 ENCOUNTER — Encounter: Payer: Self-pay | Admitting: Gynecology

## 2013-11-21 DIAGNOSIS — E039 Hypothyroidism, unspecified: Secondary | ICD-10-CM | POA: Diagnosis not present

## 2013-11-22 ENCOUNTER — Telehealth: Payer: Self-pay | Admitting: *Deleted

## 2013-11-22 ENCOUNTER — Encounter: Payer: Self-pay | Admitting: Women's Health

## 2013-11-22 ENCOUNTER — Ambulatory Visit (INDEPENDENT_AMBULATORY_CARE_PROVIDER_SITE_OTHER): Payer: Medicare Other | Admitting: Women's Health

## 2013-11-22 VITALS — BP 100/62 | Ht 59.0 in | Wt 156.0 lb

## 2013-11-22 DIAGNOSIS — I10 Essential (primary) hypertension: Secondary | ICD-10-CM | POA: Diagnosis not present

## 2013-11-22 DIAGNOSIS — N952 Postmenopausal atrophic vaginitis: Secondary | ICD-10-CM | POA: Diagnosis not present

## 2013-11-22 DIAGNOSIS — M8580 Other specified disorders of bone density and structure, unspecified site: Secondary | ICD-10-CM | POA: Insufficient documentation

## 2013-11-22 DIAGNOSIS — M899 Disorder of bone, unspecified: Secondary | ICD-10-CM | POA: Diagnosis not present

## 2013-11-22 NOTE — Patient Instructions (Signed)
Health Recommendations for Postmenopausal Women Respected and ongoing research has looked at the most common causes of death, disability, and poor quality of life in postmenopausal women. The causes include heart disease, diseases of blood vessels, diabetes, depression, cancer, and bone loss (osteoporosis). Many things can be done to help lower the chances of developing these and other common problems: CARDIOVASCULAR DISEASE Heart Disease: A heart attack is a medical emergency. Know the signs and symptoms of a heart attack. Below are things women can do to reduce their risk for heart disease.   Do not smoke. If you smoke, quit.  Aim for a healthy weight. Being overweight causes many preventable deaths. Eat a healthy and balanced diet and drink an adequate amount of liquids.  Get moving. Make a commitment to be more physically active. Aim for 30 minutes of activity on most, if not all days of the week.  Eat for heart health. Choose a diet that is low in saturated fat and cholesterol and eliminate trans fat. Include whole grains, vegetables, and fruits. Read and understand the labels on food containers before buying.  Know your numbers. Ask your caregiver to check your blood pressure, cholesterol (total, HDL, LDL, triglycerides) and blood glucose. Work with your caregiver on improving your entire clinical picture.  High blood pressure. Limit or stop your table salt intake (try salt substitute and food seasonings). Avoid salty foods and drinks. Read labels on food containers before buying. Eating well and exercising can help control high blood pressure. STROKE  Stroke is a medical emergency. Stroke may be the result of a blood clot in a blood vessel in the brain or by a brain hemorrhage (bleeding). Know the signs and symptoms of a stroke. To lower the risk of developing a stroke:  Avoid fatty foods.  Quit smoking.  Control your diabetes, blood pressure, and irregular heart rate. THROMBOPHLEBITIS  (BLOOD CLOT) OF THE LEG  Becoming overweight and leading a stationary lifestyle may also contribute to developing blood clots. Controlling your diet and exercising will help lower the risk of developing blood clots. CANCER SCREENING  Breast Cancer: Take steps to reduce your risk of breast cancer.  You should practice "breast self-awareness." This means understanding the normal appearance and feel of your breasts and should include breast self-examination. Any changes detected, no matter how small, should be reported to your caregiver.  After age 40, you should have a clinical breast exam (CBE) every year.  Starting at age 40, you should consider having a mammogram (breast X-ray) every year.  If you have a family history of breast cancer, talk to your caregiver about genetic screening.  If you are at high risk for breast cancer, talk to your caregiver about having an MRI and a mammogram every year.  Intestinal or Stomach Cancer: Tests to consider are a rectal exam, fecal occult blood, sigmoidoscopy, and colonoscopy. Women who are high risk may need to be screened at an earlier age and more often.  Cervical Cancer:  Beginning at age 30, you should have a Pap test every 3 years as long as the past 3 Pap tests have been normal.  If you have had past treatment for cervical cancer or a condition that could lead to cancer, you need Pap tests and screening for cancer for at least 20 years after your treatment.  If you had a hysterectomy for a problem that was not cancer or a condition that could lead to cancer, then you no longer need Pap tests.    If you are between ages 65 and 70, and you have had normal Pap tests going back 10 years, you no longer need Pap tests.  If Pap tests have been discontinued, risk factors (such as a new sexual partner) need to be reassessed to determine if screening should be resumed.  Some medical problems can increase the chance of getting cervical cancer. In these  cases, your caregiver may recommend more frequent screening and Pap tests.  Uterine Cancer: If you have vaginal bleeding after reaching menopause, you should notify your caregiver.  Ovarian cancer: Other than yearly pelvic exams, there are no reliable tests available to screen for ovarian cancer at this time except for yearly pelvic exams.  Lung Cancer: Yearly chest X-rays can detect lung cancer and should be done on high risk women, such as cigarette smokers and women with chronic lung disease (emphysema).  Skin Cancer: A complete body skin exam should be done at your yearly examination. Avoid overexposure to the sun and ultraviolet light lamps. Use a strong sun block cream when in the sun. All of these things are important in lowering the risk of skin cancer. MENOPAUSE Menopause Symptoms: Hormone therapy products are effective for treating symptoms associated with menopause:  Moderate to severe hot flashes.  Night sweats.  Mood swings.  Headaches.  Tiredness.  Loss of sex drive.  Insomnia.  Other symptoms. Hormone replacement carries certain risks, especially in older women. Women who use or are thinking about using estrogen or estrogen with progestin treatments should discuss that with their caregiver. Your caregiver will help you understand the benefits and risks. The ideal dose of hormone replacement therapy is not known. The Food and Drug Administration (FDA) has concluded that hormone therapy should be used only at the lowest doses and for the shortest amount of time to reach treatment goals.  OSTEOPOROSIS Protecting Against Bone Loss and Preventing Fracture: If you use hormone therapy for prevention of bone loss (osteoporosis), the risks for bone loss must outweigh the risk of the therapy. Ask your caregiver about other medications known to be safe and effective for preventing bone loss and fractures. To guard against bone loss or fractures, the following is recommended:  If  you are less than age 50, take 1000 mg of calcium and at least 600 mg of Vitamin D per day.  If you are greater than age 50 but less than age 70, take 1200 mg of calcium and at least 600 mg of Vitamin D per day.  If you are greater than age 70, take 1200 mg of calcium and at least 800 mg of Vitamin D per day. Smoking and excessive alcohol intake increases the risk of osteoporosis. Eat foods rich in calcium and vitamin D and do weight bearing exercises several times a week as your caregiver suggests. DIABETES Diabetes Melitus: If you have Type I or Type 2 diabetes, you should keep your blood sugar under control with diet, exercise and recommended medication. Avoid too many sweets, starchy and fatty foods. Being overweight can make control more difficult. COGNITION AND MEMORY Cognition and Memory: Menopausal hormone therapy is not recommended for the prevention of cognitive disorders such as Alzheimer's disease or memory loss.  DEPRESSION  Depression may occur at any age, but is common in elderly women. The reasons may be because of physical, medical, social (loneliness), or financial problems and needs. If you are experiencing depression because of medical problems and control of symptoms, talk to your caregiver about this. Physical activity and   exercise may help with mood and sleep. Community and volunteer involvement may help your sense of value and worth. If you have depression and you feel that the problem is getting worse or becoming severe, talk to your caregiver about treatment options that are best for you. ACCIDENTS  Accidents are common and can be serious in the elderly woman. Prepare your house to prevent accidents. Eliminate throw rugs, place hand bars in the bath, shower and toilet areas. Avoid wearing high heeled shoes or walking on wet, snowy, and icy areas. Limit or stop driving if you have vision or hearing problems, or you feel you are unsteady with you movements and  reflexes. HEPATITIS C Hepatitis C is a type of viral infection affecting the liver. It is spread mainly through contact with blood from an infected person. It can be treated, but if left untreated, it can lead to severe liver damage over years. Many people who are infected do not know that the virus is in their blood. If you are a "baby-boomer", it is recommended that you have one screening test for Hepatitis C. IMMUNIZATIONS  Several immunizations are important to consider having during your senior years, including:   Tetanus, diptheria, and pertussis booster shot.  Influenza every year before the flu season begins.  Pneumonia vaccine.  Shingles vaccine.  Others as indicated based on your specific needs. Talk to your caregiver about these. Document Released: 01/14/2006 Document Revised: 11/08/2012 Document Reviewed: 09/09/2008 [employer] Patient Information 2014 [employer].  

## 2013-11-22 NOTE — Telephone Encounter (Signed)
Pt forgot to ask you at [facility] today if you thought estrogen would help with her "issues" please advise

## 2013-11-22 NOTE — Telephone Encounter (Signed)
Please call, risk for estrogen included blood clots, strokes, breast cancer. Could try some topical estrogen  cream which I think she is done in the past for labial fusion without much relief. Ask if she wants to retry estrogen cream 3 times per week.Marland Kitchen

## 2013-11-22 NOTE — Telephone Encounter (Signed)
Left message for pt to call.

## 2013-11-22 NOTE — Progress Notes (Signed)
Christina Sellers 11-13-1941 [id number]    History:    The patient presents for breast and pelvic exam. Normal Pap and mammogram history. Postmenopausal/no HRT/no bleeding. DEXA stable. 02/2012 T score -1.5 right femoral neck, FACS 15.1%/2.2%. Negative colonoscopy.  Past medical history, past surgical history, family history and social history were all reviewed and documented in the EPIC chart. Widow. 3 children 2 daughters live away one son lives here. 5 grandchildren all doing well 2 grandsons at [facility]. Sister breast cancer in her 100s.   ROS:  A  ROS was performed and pertinent positives and negatives are included in the history.  Exam:  Filed Vitals:   11/22/13 1101  BP: 100/62    General appearance:  Normal Head/Neck:  Normal, without cervical or supraclavicular adenopathy. Thyroid:  Symmetrical, normal in size, without palpable masses or nodularity. Respiratory  Effort:  Normal  Auscultation:  Clear without wheezing or rhonchi Cardiovascular  Auscultation:  Regular rate, without rubs, murmurs or gallops  Edema/varicosities:  Not grossly evident Abdominal  Soft,nontender, without masses, guarding or rebound.  Liver/spleen:  No organomegaly noted  Hernia:  None appreciated  Skin  Inspection:  Grossly normal  Palpation:  Grossly normal Neurologic/psychiatric  Orientation:  Normal with appropriate conversation.  Mood/affect:  Normal  Genitourinary    Breasts: Examined lying and sitting.     Right: Without masses, retractions, discharge or axillary adenopathy.     Left: Without masses, retractions, discharge or axillary adenopathy.   Inguinal/mons:  Normal without inguinal adenopathy  External genitalia:  Labial fusion  BUS/Urethra/Skene's glands:  Normal  Bladder:  Normal  Vagina:  Normal  Cervix:  Normal  Uterus:   normal in size, shape and contour.  Midline and mobile  Adnexa/parametria:     Rt: Without masses or tenderness.   Lt: Without masses or  tenderness.  Anus and perineum: Normal  Digital rectal exam: Normal sphincter tone without palpated masses or tenderness  Assessment/Plan:  72 y.o.WWF G3P3  for breast and pelvic exam.  Labial fusion with no difficulty voiding Postmenopausal no bleeding/no HRT Osteopenia stable Hypertension/hypercholesterolemia -Primary care manages labs and meds  Plan: SBE's, continue annual mammogram, 3-D tomography reviewed and encouraged history of dense breast. Continue exercise,  home safety and fall prevention discussed. Calcium rich diet and vitamin D 2000 daily encouraged. Reviewed topical estrogen for labial fusion has used in the past without much relief. Instructed to call if any difficulty voiding. Normal Pap history, no Pap done.   Harrington Challenger [facility], 1:36 PM 11/22/2013

## 2013-11-23 MED ORDER — ESTRADIOL 0.1 MG/GM VA CREA: TOPICAL_CREAM | VAGINAL | Status: DC

## 2013-11-23 NOTE — Telephone Encounter (Signed)
rx sent, pt informed.  

## 2013-11-23 NOTE — Telephone Encounter (Signed)
Pt said she yes she would like to use topical estrogen cream please advise

## 2013-11-23 NOTE — Telephone Encounter (Signed)
Day, please call in Estrace vaginal cream use small amount externally 3 times a week at  Bedtime. Dispense 1 tube with 12 refills.

## 2013-11-28 ENCOUNTER — Encounter: Payer: Self-pay | Admitting: Obstetrics and Gynecology

## 2013-12-27 ENCOUNTER — Encounter: Payer: Self-pay | Admitting: Podiatry

## 2013-12-27 ENCOUNTER — Ambulatory Visit (INDEPENDENT_AMBULATORY_CARE_PROVIDER_SITE_OTHER): Payer: Medicare Other | Admitting: Podiatry

## 2013-12-27 VITALS — BP 140/74 | HR 86 | Resp 16

## 2013-12-27 DIAGNOSIS — L84 Corns and callosities: Secondary | ICD-10-CM | POA: Diagnosis not present

## 2013-12-27 NOTE — Progress Notes (Signed)
Subjective:     Patient ID: Christina Sellers, female   DOB: Mar 16, 1941, 73 y.o.   MRN: [id number]  HPI I have these calluses that hurt   Review of Systems     Objective:   Physical Exam Neurovascular status intact with keratotic lesions sub-metatarsal left over right    Assessment:     Chronic callus    Plan:     Debridement painful callus left over right foot

## 2014-01-06 DIAGNOSIS — J069 Acute upper respiratory infection, unspecified: Secondary | ICD-10-CM | POA: Diagnosis not present

## 2014-01-11 DIAGNOSIS — J069 Acute upper respiratory infection, unspecified: Secondary | ICD-10-CM | POA: Diagnosis not present

## 2014-01-14 DIAGNOSIS — M899 Disorder of bone, unspecified: Secondary | ICD-10-CM | POA: Diagnosis not present

## 2014-01-14 DIAGNOSIS — M949 Disorder of cartilage, unspecified: Secondary | ICD-10-CM | POA: Diagnosis not present

## 2014-01-18 ENCOUNTER — Encounter: Payer: Self-pay | Admitting: Women's Health

## 2014-01-25 DIAGNOSIS — J209 Acute bronchitis, unspecified: Secondary | ICD-10-CM | POA: Diagnosis not present

## 2014-02-08 DIAGNOSIS — H02059 Trichiasis without entropian unspecified eye, unspecified eyelid: Secondary | ICD-10-CM | POA: Diagnosis not present

## 2014-02-08 DIAGNOSIS — H04129 Dry eye syndrome of unspecified lacrimal gland: Secondary | ICD-10-CM | POA: Diagnosis not present

## 2014-02-15 DIAGNOSIS — L821 Other seborrheic keratosis: Secondary | ICD-10-CM | POA: Diagnosis not present

## 2014-02-15 DIAGNOSIS — L259 Unspecified contact dermatitis, unspecified cause: Secondary | ICD-10-CM | POA: Diagnosis not present

## 2014-02-15 DIAGNOSIS — L819 Disorder of pigmentation, unspecified: Secondary | ICD-10-CM | POA: Diagnosis not present

## 2014-02-15 DIAGNOSIS — D235 Other benign neoplasm of skin of trunk: Secondary | ICD-10-CM | POA: Diagnosis not present

## 2014-02-15 DIAGNOSIS — K130 Diseases of lips: Secondary | ICD-10-CM | POA: Diagnosis not present

## 2014-02-15 DIAGNOSIS — D1801 Hemangioma of skin and subcutaneous tissue: Secondary | ICD-10-CM | POA: Diagnosis not present

## 2014-02-15 DIAGNOSIS — L738 Other specified follicular disorders: Secondary | ICD-10-CM | POA: Diagnosis not present

## 2014-02-21 ENCOUNTER — Ambulatory Visit (INDEPENDENT_AMBULATORY_CARE_PROVIDER_SITE_OTHER): Payer: Medicare Other | Admitting: Podiatry

## 2014-02-21 VITALS — BP 144/66 | HR 84 | Resp 12

## 2014-02-21 DIAGNOSIS — L84 Corns and callosities: Secondary | ICD-10-CM

## 2014-02-21 DIAGNOSIS — I70209 Unspecified atherosclerosis of native arteries of extremities, unspecified extremity: Secondary | ICD-10-CM

## 2014-02-22 NOTE — Progress Notes (Signed)
Subjective:     Patient ID: Christina Sellers, female   DOB: 1941-01-23, 73 y.o.   MRN: [id number]  HPI patient presents with painful corn on the plantar left foot that she cannot take care of   Review of Systems     Objective:   Physical Exam Neurovascular status unchanged with keratotic lesion sub-left third metatarsal but painful    Assessment:     Lesion left foot    Plan:     Debridement lesion with no pain noted

## 2014-04-30 DIAGNOSIS — M5136 Other intervertebral disc degeneration, lumbar region: Secondary | ICD-10-CM | POA: Insufficient documentation

## 2014-04-30 DIAGNOSIS — M545 Low back pain, unspecified: Secondary | ICD-10-CM | POA: Diagnosis not present

## 2014-04-30 DIAGNOSIS — M51369 Other intervertebral disc degeneration, lumbar region without mention of lumbar back pain or lower extremity pain: Secondary | ICD-10-CM | POA: Insufficient documentation

## 2014-04-30 DIAGNOSIS — M4310 Spondylolisthesis, site unspecified: Secondary | ICD-10-CM | POA: Insufficient documentation

## 2014-04-30 DIAGNOSIS — M546 Pain in thoracic spine: Secondary | ICD-10-CM | POA: Diagnosis not present

## 2014-04-30 DIAGNOSIS — M5137 Other intervertebral disc degeneration, lumbosacral region: Secondary | ICD-10-CM | POA: Diagnosis not present

## 2014-04-30 DIAGNOSIS — M47817 Spondylosis without myelopathy or radiculopathy, lumbosacral region: Secondary | ICD-10-CM | POA: Diagnosis not present

## 2014-05-03 DIAGNOSIS — M4310 Spondylolisthesis, site unspecified: Secondary | ICD-10-CM | POA: Diagnosis not present

## 2014-05-03 DIAGNOSIS — M5126 Other intervertebral disc displacement, lumbar region: Secondary | ICD-10-CM | POA: Diagnosis not present

## 2014-05-03 DIAGNOSIS — M48061 Spinal stenosis, lumbar region without neurogenic claudication: Secondary | ICD-10-CM | POA: Diagnosis not present

## 2014-05-08 DIAGNOSIS — K1370 Unspecified lesions of oral mucosa: Secondary | ICD-10-CM | POA: Diagnosis not present

## 2014-05-08 DIAGNOSIS — H04129 Dry eye syndrome of unspecified lacrimal gland: Secondary | ICD-10-CM | POA: Diagnosis not present

## 2014-05-08 DIAGNOSIS — E7800 Pure hypercholesterolemia, unspecified: Secondary | ICD-10-CM | POA: Diagnosis not present

## 2014-05-08 DIAGNOSIS — M3500 Sicca syndrome, unspecified: Secondary | ICD-10-CM | POA: Diagnosis not present

## 2014-05-08 DIAGNOSIS — E538 Deficiency of other specified B group vitamins: Secondary | ICD-10-CM | POA: Diagnosis not present

## 2014-05-08 DIAGNOSIS — R55 Syncope and collapse: Secondary | ICD-10-CM | POA: Diagnosis not present

## 2014-05-08 DIAGNOSIS — M545 Low back pain, unspecified: Secondary | ICD-10-CM | POA: Diagnosis not present

## 2014-05-08 DIAGNOSIS — L439 Lichen planus, unspecified: Secondary | ICD-10-CM | POA: Diagnosis not present

## 2014-05-15 DIAGNOSIS — M4310 Spondylolisthesis, site unspecified: Secondary | ICD-10-CM | POA: Diagnosis not present

## 2014-05-15 DIAGNOSIS — M5137 Other intervertebral disc degeneration, lumbosacral region: Secondary | ICD-10-CM | POA: Diagnosis not present

## 2014-05-15 DIAGNOSIS — M48061 Spinal stenosis, lumbar region without neurogenic claudication: Secondary | ICD-10-CM | POA: Diagnosis not present

## 2014-05-15 DIAGNOSIS — M47817 Spondylosis without myelopathy or radiculopathy, lumbosacral region: Secondary | ICD-10-CM | POA: Diagnosis not present

## 2014-06-20 DIAGNOSIS — M47817 Spondylosis without myelopathy or radiculopathy, lumbosacral region: Secondary | ICD-10-CM | POA: Diagnosis not present

## 2014-06-20 DIAGNOSIS — M48061 Spinal stenosis, lumbar region without neurogenic claudication: Secondary | ICD-10-CM | POA: Diagnosis not present

## 2014-06-20 DIAGNOSIS — M5137 Other intervertebral disc degeneration, lumbosacral region: Secondary | ICD-10-CM | POA: Diagnosis not present

## 2014-06-28 DIAGNOSIS — K219 Gastro-esophageal reflux disease without esophagitis: Secondary | ICD-10-CM | POA: Diagnosis not present

## 2014-07-02 DIAGNOSIS — M25579 Pain in unspecified ankle and joints of unspecified foot: Secondary | ICD-10-CM | POA: Diagnosis not present

## 2014-07-29 DIAGNOSIS — R61 Generalized hyperhidrosis: Secondary | ICD-10-CM | POA: Diagnosis not present

## 2014-07-29 DIAGNOSIS — I10 Essential (primary) hypertension: Secondary | ICD-10-CM | POA: Diagnosis not present

## 2014-07-29 DIAGNOSIS — E039 Hypothyroidism, unspecified: Secondary | ICD-10-CM | POA: Diagnosis not present

## 2014-07-30 ENCOUNTER — Ambulatory Visit
Admission: RE | Admit: 2014-07-30 | Discharge: 2014-07-30 | Disposition: A | Discharge status: Home / Self Care | Payer: Medicare Other | Source: Ambulatory Visit | Attending: Internal Medicine | Admitting: Internal Medicine

## 2014-07-30 ENCOUNTER — Other Ambulatory Visit: Payer: Self-pay | Admitting: Internal Medicine

## 2014-07-30 DIAGNOSIS — R61 Generalized hyperhidrosis: Secondary | ICD-10-CM

## 2014-07-31 ENCOUNTER — Encounter: Payer: Self-pay | Admitting: Women's Health

## 2014-07-31 ENCOUNTER — Ambulatory Visit (INDEPENDENT_AMBULATORY_CARE_PROVIDER_SITE_OTHER): Payer: Medicare Other | Admitting: Women's Health

## 2014-07-31 DIAGNOSIS — I70209 Unspecified atherosclerosis of native arteries of extremities, unspecified extremity: Secondary | ICD-10-CM

## 2014-07-31 DIAGNOSIS — N938 Other specified abnormal uterine and vaginal bleeding: Secondary | ICD-10-CM

## 2014-07-31 DIAGNOSIS — N925 Other specified irregular menstruation: Secondary | ICD-10-CM | POA: Diagnosis not present

## 2014-07-31 DIAGNOSIS — N898 Other specified noninflammatory disorders of vagina: Secondary | ICD-10-CM | POA: Diagnosis not present

## 2014-07-31 DIAGNOSIS — N949 Unspecified condition associated with female genital organs and menstrual cycle: Secondary | ICD-10-CM | POA: Diagnosis not present

## 2014-07-31 LAB — WET PREP FOR TRICH, YEAST, CLUE
Clue Cells Wet Prep HPF POC: NONE SEEN
TRICH WET PREP: NONE SEEN
WBC, Wet Prep HPF POC: NONE SEEN
YEAST WET PREP: NONE SEEN

## 2014-07-31 NOTE — Progress Notes (Signed)
Patient ID: Christina Sellers, female   DOB: October 26, 1941, 73 y.o.   MRN: [id number] Presents with complaint of scant dark brown spotting for 2 days followed by a scant pink discharge with odor and now having no discharge but noticed a bulging sensation vaginally. Has occasional urinary frequency without burning or pain. Denies abdominal pain or fever. Postmenopausal/no HRT. Ultrasound 2010 severe pain from ultrasound, endometrial lining 5 mm. Not sexually active. Having back pain, recently had epidural, pain returning.  Exam: Appears stated age. External genitalia labial fusion, no visible cystocele or rectocele, mild erythema introitus, no visible blood noted. Speculum exam difficult to visualize cervix, atrophic, no visible blood, erythema, discharge noted. Wet prep negative. Bimanual no adnexal fullness or tenderness no visible blood on glove.  Postmenopausal spotting  Plan: Transabdominal GYN ultrasound with full bladder to assess endometrial thickness. Will schedule.

## 2014-08-05 DIAGNOSIS — E039 Hypothyroidism, unspecified: Secondary | ICD-10-CM | POA: Diagnosis not present

## 2014-08-05 DIAGNOSIS — I10 Essential (primary) hypertension: Secondary | ICD-10-CM | POA: Diagnosis not present

## 2014-08-05 DIAGNOSIS — R61 Generalized hyperhidrosis: Secondary | ICD-10-CM | POA: Diagnosis not present

## 2014-08-13 DIAGNOSIS — M47817 Spondylosis without myelopathy or radiculopathy, lumbosacral region: Secondary | ICD-10-CM | POA: Diagnosis not present

## 2014-08-13 DIAGNOSIS — M5137 Other intervertebral disc degeneration, lumbosacral region: Secondary | ICD-10-CM | POA: Diagnosis not present

## 2014-08-13 DIAGNOSIS — M4310 Spondylolisthesis, site unspecified: Secondary | ICD-10-CM | POA: Diagnosis not present

## 2014-08-13 DIAGNOSIS — M48062 Spinal stenosis, lumbar region with neurogenic claudication: Secondary | ICD-10-CM | POA: Diagnosis not present

## 2014-08-16 ENCOUNTER — Ambulatory Visit (INDEPENDENT_AMBULATORY_CARE_PROVIDER_SITE_OTHER): Payer: Medicare Other | Admitting: Women's Health

## 2014-08-16 ENCOUNTER — Ambulatory Visit (INDEPENDENT_AMBULATORY_CARE_PROVIDER_SITE_OTHER): Payer: Medicare Other

## 2014-08-16 ENCOUNTER — Encounter: Payer: Self-pay | Admitting: Women's Health

## 2014-08-16 DIAGNOSIS — I70209 Unspecified atherosclerosis of native arteries of extremities, unspecified extremity: Secondary | ICD-10-CM

## 2014-08-16 DIAGNOSIS — N949 Unspecified condition associated with female genital organs and menstrual cycle: Secondary | ICD-10-CM

## 2014-08-16 DIAGNOSIS — N950 Postmenopausal bleeding: Secondary | ICD-10-CM | POA: Diagnosis not present

## 2014-08-16 DIAGNOSIS — N925 Other specified irregular menstruation: Secondary | ICD-10-CM | POA: Diagnosis not present

## 2014-08-16 DIAGNOSIS — N938 Other specified abnormal uterine and vaginal bleeding: Secondary | ICD-10-CM | POA: Diagnosis not present

## 2014-08-16 NOTE — Progress Notes (Signed)
Patient ID: Christina Sellers, female   DOB: 1940/12/23, 73 y.o.   MRN: [id number] Presents for ultrasound, had scant brown /pink vaginal discharge 2 weeks ago, none since. 2010 ultrasound endometrium 5 mm. No HRT. Labial fusion. Hypothyroid, thyroid dose changes last week.  Ultrasound: Transabdominal, no uterine abnormalities, endometrium 5 mm, no adnexal masses, ovaries unable to be identified.  Stable endometrium  Plan: Reviewed no change in endometrial thickness, instructed to call if any further bleeding.

## 2014-09-04 ENCOUNTER — Ambulatory Visit: Payer: Medicare Other | Attending: Neurosurgery

## 2014-09-04 DIAGNOSIS — M545 Low back pain, unspecified: Secondary | ICD-10-CM | POA: Diagnosis not present

## 2014-09-04 DIAGNOSIS — R293 Abnormal posture: Secondary | ICD-10-CM | POA: Diagnosis not present

## 2014-09-04 DIAGNOSIS — R262 Difficulty in walking, not elsewhere classified: Secondary | ICD-10-CM | POA: Diagnosis not present

## 2014-09-04 DIAGNOSIS — IMO0001 Reserved for inherently not codable concepts without codable children: Secondary | ICD-10-CM | POA: Insufficient documentation

## 2014-09-09 ENCOUNTER — Ambulatory Visit: Payer: Medicare Other | Attending: Neurosurgery | Admitting: Rehabilitation

## 2014-09-09 DIAGNOSIS — R293 Abnormal posture: Secondary | ICD-10-CM | POA: Diagnosis not present

## 2014-09-09 DIAGNOSIS — Z5189 Encounter for other specified aftercare: Secondary | ICD-10-CM | POA: Diagnosis not present

## 2014-09-09 DIAGNOSIS — R262 Difficulty in walking, not elsewhere classified: Secondary | ICD-10-CM | POA: Insufficient documentation

## 2014-09-09 DIAGNOSIS — M545 Low back pain: Secondary | ICD-10-CM | POA: Insufficient documentation

## 2014-09-11 ENCOUNTER — Ambulatory Visit: Payer: Medicare Other | Admitting: Physical Therapy

## 2014-09-11 ENCOUNTER — Encounter: Payer: Medicare Other | Admitting: Physical Therapy

## 2014-09-11 DIAGNOSIS — Z5189 Encounter for other specified aftercare: Secondary | ICD-10-CM | POA: Diagnosis not present

## 2014-09-16 ENCOUNTER — Ambulatory Visit: Payer: Medicare Other

## 2014-09-19 ENCOUNTER — Ambulatory Visit: Payer: Medicare Other

## 2014-09-19 DIAGNOSIS — Z5189 Encounter for other specified aftercare: Secondary | ICD-10-CM | POA: Diagnosis not present

## 2014-09-21 DIAGNOSIS — Z23 Encounter for immunization: Secondary | ICD-10-CM | POA: Diagnosis not present

## 2014-09-23 ENCOUNTER — Ambulatory Visit: Payer: Medicare Other | Admitting: Physical Therapy

## 2014-09-23 DIAGNOSIS — Z5189 Encounter for other specified aftercare: Secondary | ICD-10-CM | POA: Diagnosis not present

## 2014-10-02 DIAGNOSIS — F329 Major depressive disorder, single episode, unspecified: Secondary | ICD-10-CM | POA: Diagnosis not present

## 2014-10-02 DIAGNOSIS — E559 Vitamin D deficiency, unspecified: Secondary | ICD-10-CM | POA: Diagnosis not present

## 2014-10-02 DIAGNOSIS — E039 Hypothyroidism, unspecified: Secondary | ICD-10-CM | POA: Diagnosis not present

## 2014-10-02 DIAGNOSIS — I10 Essential (primary) hypertension: Secondary | ICD-10-CM | POA: Diagnosis not present

## 2014-10-02 DIAGNOSIS — N183 Chronic kidney disease, stage 3 (moderate): Secondary | ICD-10-CM | POA: Diagnosis not present

## 2014-10-02 DIAGNOSIS — E538 Deficiency of other specified B group vitamins: Secondary | ICD-10-CM | POA: Diagnosis not present

## 2014-10-02 DIAGNOSIS — M25551 Pain in right hip: Secondary | ICD-10-CM | POA: Diagnosis not present

## 2014-10-02 DIAGNOSIS — Z23 Encounter for immunization: Secondary | ICD-10-CM | POA: Diagnosis not present

## 2014-10-02 DIAGNOSIS — M109 Gout, unspecified: Secondary | ICD-10-CM | POA: Diagnosis not present

## 2014-10-02 DIAGNOSIS — E780 Pure hypercholesterolemia: Secondary | ICD-10-CM | POA: Diagnosis not present

## 2014-10-02 DIAGNOSIS — M4800 Spinal stenosis, site unspecified: Secondary | ICD-10-CM | POA: Diagnosis not present

## 2014-10-02 DIAGNOSIS — N898 Other specified noninflammatory disorders of vagina: Secondary | ICD-10-CM | POA: Diagnosis not present

## 2014-10-03 ENCOUNTER — Ambulatory Visit: Payer: Medicare Other

## 2014-10-03 DIAGNOSIS — Z5189 Encounter for other specified aftercare: Secondary | ICD-10-CM | POA: Diagnosis not present

## 2014-10-07 ENCOUNTER — Encounter: Payer: Self-pay | Admitting: Women's Health

## 2014-10-07 ENCOUNTER — Ambulatory Visit: Payer: Medicare Other | Attending: Neurosurgery

## 2014-10-07 DIAGNOSIS — R262 Difficulty in walking, not elsewhere classified: Secondary | ICD-10-CM | POA: Diagnosis not present

## 2014-10-07 DIAGNOSIS — M545 Low back pain, unspecified: Secondary | ICD-10-CM

## 2014-10-07 DIAGNOSIS — R293 Abnormal posture: Secondary | ICD-10-CM | POA: Diagnosis not present

## 2014-10-07 DIAGNOSIS — Z5189 Encounter for other specified aftercare: Secondary | ICD-10-CM | POA: Insufficient documentation

## 2014-10-07 DIAGNOSIS — M2560 Stiffness of unspecified joint, not elsewhere classified: Secondary | ICD-10-CM

## 2014-10-07 NOTE — Therapy (Signed)
Physical Therapy Treatment  Patient Details  Name: Christina Sellers MRN: [id number] Date of Birth: 14-Jan-1941  Encounter Date: 10/07/2014      PT End of Session - 10/07/14 1059    Visit Number 8   Number of Visits 16   PT Start Time 1779   PT Stop Time 1120   PT Time Calculation (min) 65 min   Equipment Utilized During Treatment --  lumbar traction unit   Activity Tolerance Patient tolerated treatment well      Past Medical History  Diagnosis Date  . Osteopenia   . Recurrent vaginitis   . History of recurrent UTIs   . Hypertension   . Elevated cholesterol   . Thyroid disease   . Labial fusion   . Depression   . Anxiety   . GERD (gastroesophageal reflux disease)   . DJD (degenerative joint disease)   . Osteoarthritis of left knee   . Hypothyroidism   . Cellulitis   . IBS (irritable bowel syndrome)   . Allergic sinusitis   . Insomnia   . Atherosclerotic peripheral vascular disease   . Carotid artery stenosis   . Elevated MCV     secondary to alchol  . Abnormal Pap smear of vagina   . COPD with asthma   . Tendonitis   . Gout   . Sjogren's syndrome   . Syncope 11.7.14    secondary to dehydration and possible hypoglycemia    Past Surgical History  Procedure Laterality Date  . Tonsillectomy    . Cholecystectomy    . Knee surgery    . Eye surgery      There were no vitals taken for this visit.  Visit Diagnosis:  Right-sided low back pain without sciatica  Difficulty in walking  Joint stiffness of spine US done 8 minutes at 1/5 W/cm2 1 MHz 100% Manual treatment with STW to RT gluteals along iliac crest and toward trochantor.   Gr 3 PA mobs to lumbar spine.    Mechanical traction supine intermittant 60/15 seconds pulling then releasing at 55 pull/25 pounds release force.  Pt report it felt good in lower back      Adult PT Treatment/Exercise - 10/07/14 0700    Exercises Lumbar   Piriformis Stretch 30 seconds;2 reps;Other (comment)             PT Short Term Goals - 10/07/14 1011    Title Independent wiht initial HEP   Time 4   Period Weeks   Status New   Title report paiun decr 25% or more with wlaking    Time 4   Period Weeks   Status New   Title report 40% improved with walking up and down stairs   Time 4   Period Weeks   Status New          PT Long Term Goals - 10/07/14 1012    Title Demonstrate undersanidng of posture and body mechanics   Time 8   Period Weeks   Status New   Title independent with advanced HEP    Time 8   Period Weeks   Status New   Title report pain decr. 50% or mroe  with normal  daily activity   Time 8   Period Weeks   Status New   Title report5 50% decr. pain with walking stairs   Time 8   Period Weeks   Status New   Title report 50% decr pain with walking in community   Time  8   Period Weeks   Status New   Additional Long Term Goals Yes          Plan - 10/07/14 1103    Clinical Impression Statement She is progressing toward goals with report of decr pain with walking   Pt will benefit from skilled therapeutic intervention in order to improve on the following deficits Difficulty walking;Impaired flexibility;Pain   Rehab Potential Good   PT Frequency Min 2X/week   PT Duration 4 weeks   PT Treatment/Interventions Therapeutic exercise;Modalities;Manual techniques        Problem List Patient Active Problem List   Diagnosis Date Noted  . Atherosclerotic peripheral vascular disease   . Essential hypertension, benign 11/22/2013  . Osteopenia 11/22/2013  . Labial fusion                                             Darrel Hoover 10/07/2014, 11:14 AM

## 2014-10-10 ENCOUNTER — Ambulatory Visit: Payer: Medicare Other

## 2014-10-10 DIAGNOSIS — Z5189 Encounter for other specified aftercare: Secondary | ICD-10-CM | POA: Diagnosis not present

## 2014-10-10 DIAGNOSIS — R262 Difficulty in walking, not elsewhere classified: Secondary | ICD-10-CM

## 2014-10-10 DIAGNOSIS — M2560 Stiffness of unspecified joint, not elsewhere classified: Secondary | ICD-10-CM

## 2014-10-10 DIAGNOSIS — M545 Low back pain, unspecified: Secondary | ICD-10-CM

## 2014-10-10 NOTE — Therapy (Addendum)
Physical Therapy Treatment  Patient Details  Name: Christina Sellers MRN: [id number] Date of Birth: [birth year]  Encounter Date: 10/10/2014      PT End of Session - 10/10/14 1507    Visit Number 9   PT Start Time 1415   PT Stop Time 1505   PT Time Calculation (min) 50 min   Activity Tolerance --  She reported feeling better post treatment, agreed to no traction to see how she does without this      Past Medical History  Diagnosis Date  . Osteopenia   . Recurrent vaginitis   . History of recurrent UTIs   . Hypertension   . Elevated cholesterol   . Thyroid disease   . Labial fusion   . Depression   . Anxiety   . GERD (gastroesophageal reflux disease)   . DJD (degenerative joint disease)   . Osteoarthritis of left knee   . Hypothyroidism   . Cellulitis   . IBS (irritable bowel syndrome)   . Allergic sinusitis   . Insomnia   . Atherosclerotic peripheral vascular disease   . Carotid artery stenosis   . Elevated MCV     secondary to alchol  . Abnormal Pap smear of vagina   . COPD with asthma   . Tendonitis   . Gout   . Sjogren's syndrome   . Syncope 11.7.14    secondary to dehydration and possible hypoglycemia    Past Surgical History  Procedure Laterality Date  . Tonsillectomy    . Cholecystectomy    . Knee surgery    . Eye surgery      There were no vitals taken for this visit.  Visit Diagnosis:  Right-sided low back pain without sciatica  Difficulty in walking  Joint stiffness of spine          [facility] Adult PT Treatment/Exercise - 10/10/14 1427    Lumbar Exercises: Sidelying   Clam 10 reps  2 sets   Other Sidelying Lumbar Exercises --  lumbar sidebending and hip IR stretching with con/relax   Modalities   Modalities Ultrasound;Traction   Ultrasound   Ultrasound Location --  RT buttocks and lower lumbar.    Ultrasound Parameters --  100% 1 MHZ, 1.7 Wcm2   Ultrasound Goals Pain   Manual Therapy   Manual Therapy Myofascial release   Manual Therapy   Joint Mobilization --  Long axis pull to RT LE for hip distraction,lower back tract   Massage --  STW to RT lower back and RT gluteals along iliac crest   Myofascial Release --  with movement hip active and passive movements   Other Manual Therapy --  Pa joiint mobs to L1-5 Gr 3-4          Education - 10/10/14 1507    Education provided No              Plan - 10/10/14 1509    Clinical Impression Statement Soft tissue looser post treatment. walked some better but may need to look at hip flexors tightness.    PT Plan Continue manual/ ultrasound/fascial release/ test hip flexors tightness. possible resume [place]    Continue 2x/week for 4 weeks but due to availability it may be up to 11/11/14    Problem List Patient Active Problem List   Diagnosis Date Noted  . Atherosclerotic peripheral vascular disease   . Essential hypertension, benign 11/22/2013  . Osteopenia 11/22/2013  . Labial fusion  Darrel Hoover 10/10/2014, 3:17 PM

## 2014-10-11 DIAGNOSIS — M4726 Other spondylosis with radiculopathy, lumbar region: Secondary | ICD-10-CM | POA: Diagnosis not present

## 2014-10-11 DIAGNOSIS — M4806 Spinal stenosis, lumbar region: Secondary | ICD-10-CM | POA: Diagnosis not present

## 2014-10-11 DIAGNOSIS — Z6832 Body mass index (BMI) 32.0-32.9, adult: Secondary | ICD-10-CM | POA: Diagnosis not present

## 2014-10-11 DIAGNOSIS — M4316 Spondylolisthesis, lumbar region: Secondary | ICD-10-CM | POA: Diagnosis not present

## 2014-10-11 DIAGNOSIS — I10 Essential (primary) hypertension: Secondary | ICD-10-CM | POA: Diagnosis not present

## 2014-10-11 DIAGNOSIS — M545 Low back pain: Secondary | ICD-10-CM | POA: Diagnosis not present

## 2014-10-11 DIAGNOSIS — M5136 Other intervertebral disc degeneration, lumbar region: Secondary | ICD-10-CM | POA: Diagnosis not present

## 2014-10-14 ENCOUNTER — Ambulatory Visit: Payer: Medicare Other | Admitting: Physical Therapy

## 2014-10-14 DIAGNOSIS — M545 Low back pain, unspecified: Secondary | ICD-10-CM

## 2014-10-14 DIAGNOSIS — Z5189 Encounter for other specified aftercare: Secondary | ICD-10-CM | POA: Diagnosis not present

## 2014-10-14 DIAGNOSIS — M2560 Stiffness of unspecified joint, not elsewhere classified: Secondary | ICD-10-CM

## 2014-10-14 DIAGNOSIS — R262 Difficulty in walking, not elsewhere classified: Secondary | ICD-10-CM

## 2014-10-14 NOTE — Therapy (Signed)
Physical Therapy Treatment  Patient Details  Name: Reighlynn Swiney MRN: [id number] Date of Birth: 11-26-1941  Encounter Date: 10/14/2014      PT End of Session - 10/14/14 1154    Visit Number 10   Number of Visits 16   PT Start Time 0160   PT Stop Time 1150   PT Time Calculation (min) 45 min   Activity Tolerance Patient tolerated treatment well      Past Medical History  Diagnosis Date  . Osteopenia   . Recurrent vaginitis   . History of recurrent UTIs   . Hypertension   . Elevated cholesterol   . Thyroid disease   . Labial fusion   . Depression   . Anxiety   . GERD (gastroesophageal reflux disease)   . DJD (degenerative joint disease)   . Osteoarthritis of left knee   . Hypothyroidism   . Cellulitis   . IBS (irritable bowel syndrome)   . Allergic sinusitis   . Insomnia   . Atherosclerotic peripheral vascular disease   . Carotid artery stenosis   . Elevated MCV     secondary to alchol  . Abnormal Pap smear of vagina   . COPD with asthma   . Tendonitis   . Gout   . Sjogren's syndrome   . Syncope 11.7.14    secondary to dehydration and possible hypoglycemia    Past Surgical History  Procedure Laterality Date  . Tonsillectomy    . Cholecystectomy    . Knee surgery    . Eye surgery      There were no vitals taken for this visit.  Visit Diagnosis:  No diagnosis found.          [facility] Adult PT Treatment/Exercise - 10/14/14 1136    Lumbar Exercises: Aerobic   Elliptical nustep 8 minutes   Knee/Hip Exercises: Stretches   Piriformis Stretch 4 reps;10 seconds   Knee/Hip Exercises: Standing   Other Standing Knee Exercises Anterior hip stretches, each side   Ultrasound   Ultrasound Location Rt hip gluteal area   Ultrasound Parameters 1.5 watts/cm2   Ultrasound Goals Pain   Manual Therapy   Other Manual Therapy neuromusculal trigger point relases, rt low back, piriformis          Education - 10/14/14 1201    Education provided No               Plan - 10/14/14 1201    Clinical Impression Statement continues to have pain on stairs, Rt hip.  Tissue continues to benifit from soft tissue work   PT Plan Continue manual/ ultrasound/fascial release/ test hip flexors tightness. possible resume [place]        Problem List Patient Active Problem List   Diagnosis Date Noted  . Atherosclerotic peripheral vascular disease   . Essential hypertension, benign 11/22/2013  . Osteopenia 11/22/2013  . Labial fusion                                             HARRIS,KAREN 10/14/2014, 12:15 PM

## 2014-10-16 DIAGNOSIS — E039 Hypothyroidism, unspecified: Secondary | ICD-10-CM | POA: Diagnosis not present

## 2014-10-16 DIAGNOSIS — L659 Nonscarring hair loss, unspecified: Secondary | ICD-10-CM | POA: Diagnosis not present

## 2014-10-18 ENCOUNTER — Ambulatory Visit: Payer: Medicare Other

## 2014-10-18 DIAGNOSIS — M25552 Pain in left hip: Secondary | ICD-10-CM

## 2014-10-18 DIAGNOSIS — M545 Low back pain, unspecified: Secondary | ICD-10-CM

## 2014-10-18 DIAGNOSIS — Z5189 Encounter for other specified aftercare: Secondary | ICD-10-CM | POA: Diagnosis not present

## 2014-10-18 DIAGNOSIS — R262 Difficulty in walking, not elsewhere classified: Secondary | ICD-10-CM

## 2014-10-18 DIAGNOSIS — M2560 Stiffness of unspecified joint, not elsewhere classified: Secondary | ICD-10-CM

## 2014-10-18 NOTE — Therapy (Signed)
Physical Therapy Treatment  Patient Details  Name: Christina Sellers MRN: [id number] Date of Birth: Apr 28, 1941  Encounter Date: 10/18/2014      PT End of Session - 10/18/14 0936    Visit Number 11   Number of Visits 16   PT Start Time 0850   PT Stop Time 0935   PT Time Calculation (min) 45 min   Activity Tolerance Patient tolerated treatment well   Behavior During Therapy [facility] for tasks assessed/performed      Past Medical History  Diagnosis Date  . Osteopenia   . Recurrent vaginitis   . History of recurrent UTIs   . Hypertension   . Elevated cholesterol   . Thyroid disease   . Labial fusion   . Depression   . Anxiety   . GERD (gastroesophageal reflux disease)   . DJD (degenerative joint disease)   . Osteoarthritis of left knee   . Hypothyroidism   . Cellulitis   . IBS (irritable bowel syndrome)   . Allergic sinusitis   . Insomnia   . Atherosclerotic peripheral vascular disease   . Carotid artery stenosis   . Elevated MCV     secondary to alchol  . Abnormal Pap smear of vagina   . COPD with asthma   . Tendonitis   . Gout   . Sjogren's syndrome   . Syncope 11.7.14    secondary to dehydration and possible hypoglycemia    Past Surgical History  Procedure Laterality Date  . Tonsillectomy    . Cholecystectomy    . Knee surgery    . Eye surgery      There were no vitals taken for this visit.  Visit Diagnosis:  Right-sided low back pain without sciatica  Difficulty in walking  Joint stiffness of spine  Pain in joint, pelvic region and thigh, left      Subjective Assessment - 10/18/14 0858    Symptoms Thinks doing pretty well. she walked 10 minutes and felt glad to sit.    Pertinent History Lower back pain with activity.    Limitations Standing;Walking   How long can you sit comfortably? 60 min stiff with standing   Currently in Pain? Yes   Pain Score 3    Pain Location Back   Pain Orientation Right   Pain Descriptors / Indicators Tightness   pinch   Pain Type Chronic pain   Pain Frequency Intermittent   Aggravating Factors  Stand and walking   Pain Relieving Factors [place] , massage, sit and rest    Effect of Pain on Daily Activities limit avctivity on feet   Multiple Pain Sites No            [facility] Adult PT Treatment/Exercise - 10/18/14 0903    Lumbar Exercises: Aerobic   Elliptical Nustep L3 9 minutes arms and legs   Lumbar Exercises: Supine   Other Supine Lumbar Exercises Hip rotation stretches x2 20 seconds   Knee/Hip Exercises: Stretches   Passive Hamstring Stretch 2 reps;20 seconds   Ultrasound   Ultrasound Location RT hip glutesand RT lower lumbar   Ultrasound Parameters 1.6Wcm2, 100%, 1MHz   Ultrasound Goals Pain   Manual Therapy   Manual Therapy Myofascial release   and STW          PT Education - 10/18/14 0936    Education provided No              Plan - 10/18/14 0938    Clinical Impression Statement Ms Pfeifer has  not progressed with pain limiting walking tolerance. Sh is significanly less tender inRt gluteals and RT paraspinals lumbalower.    Pt will benefit from skilled therapeutic intervention in order to improve on the following deficits Pain;Decreased strength   Rehab Potential Good   PT Frequency 2x / week   PT Duration 2 weeks   PT Treatment/Interventions Manual techniques;Therapeutic exercise;Ultrasound   PT Next Visit Plan Continue with RT hip strength, [place], STW ,exercise   PT Home Exercise Plan Progress with strength exercises   Consulted and Agree with Plan of Care Patient          G-Codes - 10-24-2014 0943    Functional Assessment Tool Used clinical judgement   Functional Limitation Mobility: Walking and moving around   Mobility: Walking and Moving Around Current Status [phone]) At least 40 percent but less than 60 percent impaired, limited or restricted   Mobility: Walking and Moving Around Goal Status [phone]) At least 40 percent but less than 60 percent impaired,  limited or restricted      Problem List Patient Active Problem List   Diagnosis Date Noted  . Atherosclerotic peripheral vascular disease   . Essential hypertension, benign 11/22/2013  . Osteopenia 11/22/2013  . Labial fusion        Charges should be 8 min ultrasound,     20 minutes manual,   12 minutes therapeutic exercise                                       Darrel Hoover PT 10-24-14, 10:01 AM

## 2014-10-21 ENCOUNTER — Ambulatory Visit: Payer: Medicare Other

## 2014-10-21 DIAGNOSIS — M545 Low back pain, unspecified: Secondary | ICD-10-CM

## 2014-10-21 DIAGNOSIS — M25552 Pain in left hip: Secondary | ICD-10-CM

## 2014-10-21 DIAGNOSIS — M2560 Stiffness of unspecified joint, not elsewhere classified: Secondary | ICD-10-CM

## 2014-10-21 DIAGNOSIS — R262 Difficulty in walking, not elsewhere classified: Secondary | ICD-10-CM

## 2014-10-21 DIAGNOSIS — Z5189 Encounter for other specified aftercare: Secondary | ICD-10-CM | POA: Diagnosis not present

## 2014-10-21 NOTE — Therapy (Signed)
Physical Therapy Treatment  Patient Details  Name: Christina Sellers MRN: [id number] Date of Birth: September 19, 1941  Encounter Date: 10/21/2014      PT End of Session - 10/21/14 1513    Visit Number 12   Number of Visits 16   PT Start Time 4098   PT Stop Time 1505   PT Time Calculation (min) 50 min   Activity Tolerance Patient tolerated treatment well  She was someshat stiff on rising from table   Behavior During Therapy [facility] for tasks assessed/performed      Past Medical History  Diagnosis Date  . Osteopenia   . Recurrent vaginitis   . History of recurrent UTIs   . Hypertension   . Elevated cholesterol   . Thyroid disease   . Labial fusion   . Depression   . Anxiety   . GERD (gastroesophageal reflux disease)   . DJD (degenerative joint disease)   . Osteoarthritis of left knee   . Hypothyroidism   . Cellulitis   . IBS (irritable bowel syndrome)   . Allergic sinusitis   . Insomnia   . Atherosclerotic peripheral vascular disease   . Carotid artery stenosis   . Elevated MCV     secondary to alchol  . Abnormal Pap smear of vagina   . COPD with asthma   . Tendonitis   . Gout   . Sjogren's syndrome   . Syncope 11.7.14    secondary to dehydration and possible hypoglycemia    Past Surgical History  Procedure Laterality Date  . Tonsillectomy    . Cholecystectomy    . Knee surgery    . Eye surgery      There were no vitals taken for this visit.  Visit Diagnosis:  Right-sided low back pain without sciatica  Difficulty in walking  Joint stiffness of spine  Pain in joint, pelvic region and thigh, left      Subjective Assessment - 10/21/14 1423    Symptoms No pain while sitting. PAin in RT lower waist and hip. feels walking better   Pertinent History Lower back pain with activity.    Limitations Standing;Walking   How long can you sit comfortably? 60 min stiff with standing   How long can you stand comfortably? 10 min   How long can you walk comfortably? 10  mn   Patient Stated Goals Reduce pain with activity on feet and traveling   Currently in Pain? No/denies   Pain Location Back   Pain Orientation Right   Pain Descriptors / Indicators Tightness   Pain Type Chronic pain   Pain Onset More than a month ago   Pain Frequency Intermittent  daily   Aggravating Factors  stand and wlaking   Pain Relieving Factors [place], massage, rest\ and sitting   Multiple Pain Sites No            [facility] Adult PT Treatment/Exercise - 10/21/14 1425    Lumbar Exercises: Stretches   Prone on Elbows Stretch 60 seconds   Lumbar Exercises: Aerobic   Elliptical Nustep L7 6 minutes.    Lumbar Exercises: Prone   Other Prone Lumbar Exercises on elows for 1 minutes   Knee/Hip Exercises: Aerobic   Stationary Bike Nustep L7 6 min   Ultrasound   Ultrasound Location RT hip and lower lumbar   Ultrasound Parameters 1.6 Wcm2   Ultrasound Goals Pain   Manual Therapy   Joint Mobilization PA gr 3 mobs to lumbar and lower thoracic spine.  Passive ROM Hip extension stretches 30 seconds x 2 RT and LT   Manual Therapy Myofascial release   and STW To lower lumbar and gluteals on RT.           PT Education - 10/21/14 1513    Education provided No              Plan - 10/21/14 1515    Clinical Impression Statement Ms Donaldson is significantly less tender in soft tixxues.   Her functional tolerance is still limitied on her feet   Rehab Potential Good   PT Frequency 2x / week   PT Duration --  1 week   PT Treatment/Interventions Ultrasound;Manual techniques;Therapeutic exercise   PT Next Visit Plan Continue with RT hip strength, [place], STW ,exercise   PT Home Exercise Plan Progress with strength exercises   Consulted and Agree with Plan of Care Patient   PT Plan Continue manual/ ultrasound/fascial release/ test hip flexors tightness. possible resume [place]        Problem List Patient Active Problem List   Diagnosis Date Noted  . Atherosclerotic peripheral  vascular disease   . Essential hypertension, benign 11/22/2013  . Osteopenia 11/22/2013  . Labial fusion                                               Darrel Hoover  PT 10/21/2014, 3:22 PM

## 2014-10-24 ENCOUNTER — Ambulatory Visit: Payer: Medicare Other

## 2014-10-24 DIAGNOSIS — M25552 Pain in left hip: Secondary | ICD-10-CM

## 2014-10-24 DIAGNOSIS — M545 Low back pain, unspecified: Secondary | ICD-10-CM

## 2014-10-24 DIAGNOSIS — Z5189 Encounter for other specified aftercare: Secondary | ICD-10-CM | POA: Diagnosis not present

## 2014-10-24 DIAGNOSIS — M2560 Stiffness of unspecified joint, not elsewhere classified: Secondary | ICD-10-CM

## 2014-10-24 DIAGNOSIS — R262 Difficulty in walking, not elsewhere classified: Secondary | ICD-10-CM

## 2014-10-24 NOTE — Patient Instructions (Signed)
Knee to chest  And ITB stretch. Daily 2x , 30 second hold 2-3 reps

## 2014-10-24 NOTE — Therapy (Signed)
Physical Therapy Treatment  Patient Details  Name: Christina Sellers MRN: [id number] Date of Birth: 06-29-1941  Encounter Date: 10/24/2014      PT End of Session - 10/24/14 1409    Visit Number 13   Number of Visits 16   Date for PT Re-Evaluation 10/30/14   PT Start Time 0115   PT Stop Time 0220   PT Time Calculation (min) 65 min   Activity Tolerance Patient tolerated treatment well   Behavior During Therapy [facility] for tasks assessed/performed      Past Medical History  Diagnosis Date  . Osteopenia   . Recurrent vaginitis   . History of recurrent UTIs   . Hypertension   . Elevated cholesterol   . Thyroid disease   . Labial fusion   . Depression   . Anxiety   . GERD (gastroesophageal reflux disease)   . DJD (degenerative joint disease)   . Osteoarthritis of left knee   . Hypothyroidism   . Cellulitis   . IBS (irritable bowel syndrome)   . Allergic sinusitis   . Insomnia   . Atherosclerotic peripheral vascular disease   . Carotid artery stenosis   . Elevated MCV     secondary to alchol  . Abnormal Pap smear of vagina   . COPD with asthma   . Tendonitis   . Gout   . Sjogren's syndrome   . Syncope 11.7.14    secondary to dehydration and possible hypoglycemia    Past Surgical History  Procedure Laterality Date  . Tonsillectomy    . Cholecystectomy    . Knee surgery    . Eye surgery      There were no vitals taken for this visit.  Visit Diagnosis:  Right-sided low back pain without sciatica - Plan: PT plan of care cert/re-cert  Difficulty in walking - Plan: PT plan of care cert/re-cert  Joint stiffness of spine - Plan: PT plan of care cert/re-cert  Pain in joint, pelvic region and thigh, left - Plan: PT plan of care cert/re-cert      Subjective Assessment - 10/24/14 1321    Symptoms Sore the next day after 10/21/14 and had difficulty walking   Patient Stated Goals Reduce pain with activity on feet and traveling   Currently in Pain? Yes   Pain Score  4    Pain Location Back  Sat all morning   Pain Orientation Right   Pain Descriptors / Indicators Tightness   Pain Type Chronic pain   Pain Onset More than a month ago   Pain Frequency Intermittent   Aggravating Factors  Standing and walking   Pain Relieving Factors [place] , massage, rest/sitting   Effect of Pain on Daily Activities limits activity oin feet   Multiple Pain Sites No            [facility] Adult PT Treatment/Exercise - 10/24/14 1324    Lumbar Exercises: Stretches   Pelvic Tilt 5 reps  with adduction   ITB Stretch 2 reps  45 seconds. LT sidelying   Piriformis Stretch 30 seconds;3 reps  RT.    Lumbar Exercises: Aerobic   Elliptical Nustep L5 6 minutes arm and legs   Moist Heat Therapy   Number Minutes Moist Heat 15 Minutes   Moist Heat Location --  RT hip and back in sidelye LT   Ultrasound   Ultrasound Location RT hip gluteals and lower lumbar   Ultrasound Parameters 1.6Wcm2   Ultrasound Goals Pain   Manual Therapy  Passive ROM 30 sec x 2   Manual Therapy Massage  STW  to lower back and gluteals          PT Education - 10/24/14 1406    Education provided Yes   Person(s) Educated Patient   Methods Explanation;Tactile cues;Handout   Comprehension Verbalized understanding;Returned demonstration          PT Short Term Goals - 10/24/14 1412    PT SHORT TERM GOAL #1   Status Achieved   PT SHORT TERM GOAL #2   Status Achieved   PT SHORT TERM GOAL #3   Status On-going          PT Long Term Goals - 10/24/14 1412    PT LONG TERM GOAL #1   Title Demonstrate undersanidng of posture and body mechanics   Time 3   Period Weeks   Status On-going   PT LONG TERM GOAL #2   Title independent with advanced HEP    Time 3   Period Weeks   Status On-going   PT LONG TERM GOAL #3   Title report pain decr. 50% or more  with normal  daily activity   Time 3   Period Weeks   Status On-going   PT LONG TERM GOAL #4   Title report5 50% decr. pain with walking  stairs   Time 3   Period Weeks   Status On-going   PT LONG TERM GOAL #5   Title report 50% decr pain with walking in community   Time 3   Period Weeks   Status On-going          Plan - 10/24/14 1409    Clinical Impression Statement She has no tenderness to STW   and pressure without twiching or jump signs but continues with pain. She may need to F/U with MD   Pt will benefit from skilled therapeutic intervention in order to improve on the following deficits Pain;Decreased strength   Rehab Potential Good   PT Frequency --  5 more visits over 3 weeks   PT Treatment/Interventions Ultrasound;Manual techniques;Therapeutic exercise;Moist Heat   PT Next Visit Plan Continue with RT hip strength, [place], STW ,exercise, heat   PT Home Exercise Plan Progress with strength exercises   Consulted and Agree with Plan of Care Patient   PT Plan Continue manual/ ultrasound/fascial release/ test hip flexors tightness.  [place], heat        Problem List Patient Active Problem List   Diagnosis Date Noted  . Atherosclerotic peripheral vascular disease   . Essential hypertension, benign 11/22/2013  . Osteopenia 11/22/2013  . Labial fusion                                             Noralee Stain MPT 10/24/2014, 2:26 PM

## 2014-10-28 ENCOUNTER — Ambulatory Visit: Payer: Medicare Other | Admitting: Physical Therapy

## 2014-10-28 DIAGNOSIS — M545 Low back pain, unspecified: Secondary | ICD-10-CM

## 2014-10-28 DIAGNOSIS — Z5189 Encounter for other specified aftercare: Secondary | ICD-10-CM | POA: Diagnosis not present

## 2014-10-28 DIAGNOSIS — M25552 Pain in left hip: Secondary | ICD-10-CM

## 2014-10-28 NOTE — Therapy (Signed)
Physical Therapy Treatment  Patient Details  Name: Christina Sellers MRN: [id number] Date of Birth: [birth year]  Encounter Date: 10/28/2014      PT End of Session - 10/28/14 1725    Visit Number 14   Number of Visits 16   Date for PT Re-Evaluation 10/30/14   PT Start Time 4098   PT Stop Time 1524   PT Time Calculation (min) 64 min   Activity Tolerance Patient tolerated treatment well      Past Medical History  Diagnosis Date  . Osteopenia   . Recurrent vaginitis   . History of recurrent UTIs   . Hypertension   . Elevated cholesterol   . Thyroid disease   . Labial fusion   . Depression   . Anxiety   . GERD (gastroesophageal reflux disease)   . DJD (degenerative joint disease)   . Osteoarthritis of left knee   . Hypothyroidism   . Cellulitis   . IBS (irritable bowel syndrome)   . Allergic sinusitis   . Insomnia   . Atherosclerotic peripheral vascular disease   . Carotid artery stenosis   . Elevated MCV     secondary to alchol  . Abnormal Pap smear of vagina   . COPD with asthma   . Tendonitis   . Gout   . Sjogren's syndrome   . Syncope 11.7.14    secondary to dehydration and possible hypoglycemia    Past Surgical History  Procedure Laterality Date  . Tonsillectomy    . Cholecystectomy    . Knee surgery    . Eye surgery      There were no vitals taken for this visit.  Visit Diagnosis:  Right-sided low back pain without sciatica  Pain in joint, pelvic region and thigh, left      Subjective Assessment - 10/28/14 1427    Symptoms Pain made her get out of bed, then exercises have helped it go away.  Pain seems to be getting better.   Pain Score 2    Pain Location Back   Pain Orientation Right   Pain Descriptors / Indicators Tightness   Pain Type Chronic pain   Pain Onset More than a month ago   Pain Frequency Intermittent   Aggravating Factors  Walking, standing, long standing in kitchen painful.   Pain Relieving Factors sitting   Effect of Pain  on Daily Activities limits activity on feet   Multiple Pain Sites No            [facility] Adult PT Treatment/Exercise - 10/28/14 1419    Lumbar Exercises: Stretches   Pelvic Tilt 5 reps   Lumbar Exercises: Aerobic   Elliptical Nustep, 8 minutes level5   Lumbar Exercises: Supine   Ab Set 5 reps   Bent Knee Raise 10 reps   Dead Bug 10 reps  added to home exercise   Lumbar Exercises: Sidelying   Other Sidelying Lumbar Exercises plank from knees on side 2 reps stopped due to shoulder. issues   Lumbar Exercises: Quadruped   Single Arm Raise --  added to home to try, patient declined to try   Straight Leg Raise --  added to home patient declined to try, has done in past and    Opposite Arm/Leg Raise Limitations added to home, patient declined practice, has done in the past.   Moist Heat Therapy   Number Minutes Moist Heat 15 Minutes   Moist Heat Location --  Rt hip and back in sidelying.   Ultrasound  Ultrasound Location --  Rt hip , low back   Ultrasound Parameters 1.5 watts/cm2, 50%, 8 minutes   Ultrasound Goals Pain                Plan - 10/28/14 1727    Clinical Impression Statement Focus today was working toward Final home exercise goal.  Patient also wanted [place] and soft tissue today, but due to the increased time with exercise, no soft tissue work was done.          Problem List Patient Active Problem List   Diagnosis Date Noted  . Atherosclerotic peripheral vascular disease   . Essential hypertension, benign 11/22/2013  . Osteopenia 11/22/2013  . Labial fusion                                               Nuvia Hileman PTA 10/28/2014, 5:52 PM

## 2014-10-28 NOTE — Patient Instructions (Signed)
Arm / Leg Extension: Alternate (All-Fours)   Raise right arm and opposite leg. Do not arch neck. Repeat ____10 times per set. Do __1__ sets per session. Do __1__ sessions per day.  http://orth.exer.us/110   Copyright  VHI. All rights reserved.  Upper Body Extension (All-Fours)   Raise right arm in front. Do not arch neck. Be sure to keep back flat. Repeat ___10_ times per set. Do ___1_ sets per session. Do ___1_ sessions per day.  http://orth.exer.us/108   Copyright  VHI. All rights reserved.  Hip Extension (All-Fours)   Lift right leg back with knee slightly flexed. Do not arch neck or back. Repeat __10__ times per set. Do 1____ sets per session. Do _1___ sessions per day.  http://orth.exer.us/106   Copyright  VHI. All rights reserved.  Lower Abdominal (Dead Bug): Strength.  Lifts opposite arm and bent  Knee while stabilizing trunk.  1 set of 10 reps, daily  C [employer]  VHI. All rights reserved.

## 2014-11-04 ENCOUNTER — Ambulatory Visit: Payer: Medicare Other

## 2014-11-04 DIAGNOSIS — M545 Low back pain, unspecified: Secondary | ICD-10-CM

## 2014-11-04 DIAGNOSIS — Z5189 Encounter for other specified aftercare: Secondary | ICD-10-CM | POA: Diagnosis not present

## 2014-11-04 DIAGNOSIS — M25552 Pain in left hip: Secondary | ICD-10-CM

## 2014-11-04 DIAGNOSIS — R262 Difficulty in walking, not elsewhere classified: Secondary | ICD-10-CM

## 2014-11-04 DIAGNOSIS — M2560 Stiffness of unspecified joint, not elsewhere classified: Secondary | ICD-10-CM

## 2014-11-04 NOTE — Therapy (Signed)
Physical Therapy Treatment  Patient Details  Name: Christina Sellers MRN: [id number] Date of Birth: Mar 28, 1941  Encounter Date: 11/04/2014      PT End of Session - 11/04/14 1741    Visit Number 15   Number of Visits 16   PT Start Time 6384   PT Stop Time 1505   PT Time Calculation (min) 43 min   Activity Tolerance Patient tolerated treatment well   Behavior During Therapy [facility] for tasks assessed/performed      Past Medical History  Diagnosis Date  . Osteopenia   . Recurrent vaginitis   . History of recurrent UTIs   . Hypertension   . Elevated cholesterol   . Thyroid disease   . Labial fusion   . Depression   . Anxiety   . GERD (gastroesophageal reflux disease)   . DJD (degenerative joint disease)   . Osteoarthritis of left knee   . Hypothyroidism   . Cellulitis   . IBS (irritable bowel syndrome)   . Allergic sinusitis   . Insomnia   . Atherosclerotic peripheral vascular disease   . Carotid artery stenosis   . Elevated MCV     secondary to alchol  . Abnormal Pap smear of vagina   . COPD with asthma   . Tendonitis   . Gout   . Sjogren's syndrome   . Syncope 11.7.14    secondary to dehydration and possible hypoglycemia    Past Surgical History  Procedure Laterality Date  . Tonsillectomy    . Cholecystectomy    . Knee surgery    . Eye surgery      There were no vitals taken for this visit.  Visit Diagnosis:  Right-sided low back pain without sciatica  Pain in joint, pelvic region and thigh, left  Difficulty in walking  Joint stiffness of spine      Subjective Assessment - 11/04/14 1422    Symptoms She is walking better but limited with standing and walking.    Currently in Pain? Yes   Pain Score 2    Pain Location Back   Pain Orientation Right   Pain Descriptors / Indicators Discomfort   Pain Type Chronic pain   Pain Onset More than a month ago   Pain Frequency Intermittent   Aggravating Factors  Walk and standing    Effect of Pain on  Daily Activities sitting   Multiple Pain Sites No            [facility] Adult PT Treatment/Exercise - 11/04/14 1432    Lumbar Exercises: Stretches   Lower Trunk Rotation 3 reps;20 seconds;2 reps  RT and LT   Lumbar Exercises: Aerobic   Elliptical Nustep L$ UE and LE   Lumbar Exercises: Supine   Bent Knee Raise 10 reps;5 seconds  RT and LT leg    Dead Bug 10 reps;5 seconds   Bridge 10 reps;3 seconds  with posterior pelvic tilt   Other Supine Lumbar Exercises Hip circles x5 clockwise and counter clockwise RT and LT x5 each 3 sets   Knee/Hip Exercises: Stretches   Piriformis Stretch 2 reps;30 seconds   Modalities   Modalities Ultrasound   Ultrasound   Ultrasound Location RT hip gluteals   Ultrasound Parameters , 100% 1 MHz   Ultrasound Goals Pain   Manual Therapy   Joint Mobilization Gr 3 axial distractions   Passive ROM Rt hip extension and ITB stretch   Manual Therapy --  STW to RT gluteals  Plan - 11/04/14 1739    Clinical Impression Statement She had mild tenderness in gluteals  and was somewhat stiff and sore after manual treatmment. Heat held due to pain from proessure of weight of hot pack.    PT Frequency 2x / week   PT Duration 2 weeks   PT Treatment/Interventions Ultrasound;Manual techniques;Therapeutic exercise;Moist Heat   PT Next Visit Plan Continue with RT hip strength, [place], STW ,exercise, heat   PT Home Exercise Plan Progress with strength exercises   Consulted and Agree with Plan of Care Patient   PT Plan Continue manual/ ultrasound/fascial release/ test hip flexors tightness.  [place], heat        Problem List Patient Active Problem List   Diagnosis Date Noted  . Atherosclerotic peripheral vascular disease   . Essential hypertension, benign 11/22/2013  . Osteopenia 11/22/2013  . Labial fusion                                               Darrel Hoover PT 11/04/2014, 5:44 PM

## 2014-11-06 DIAGNOSIS — H02831 Dermatochalasis of right upper eyelid: Secondary | ICD-10-CM | POA: Diagnosis not present

## 2014-11-06 DIAGNOSIS — H02834 Dermatochalasis of left upper eyelid: Secondary | ICD-10-CM | POA: Diagnosis not present

## 2014-11-06 DIAGNOSIS — H26493 Other secondary cataract, bilateral: Secondary | ICD-10-CM | POA: Diagnosis not present

## 2014-11-06 DIAGNOSIS — H04123 Dry eye syndrome of bilateral lacrimal glands: Secondary | ICD-10-CM | POA: Diagnosis not present

## 2014-11-11 ENCOUNTER — Ambulatory Visit: Payer: Medicare Other | Attending: Internal Medicine

## 2014-11-11 DIAGNOSIS — Z5189 Encounter for other specified aftercare: Secondary | ICD-10-CM | POA: Diagnosis not present

## 2014-11-11 DIAGNOSIS — M25552 Pain in left hip: Secondary | ICD-10-CM

## 2014-11-11 DIAGNOSIS — R262 Difficulty in walking, not elsewhere classified: Secondary | ICD-10-CM | POA: Diagnosis not present

## 2014-11-11 DIAGNOSIS — M545 Low back pain, unspecified: Secondary | ICD-10-CM

## 2014-11-11 DIAGNOSIS — R293 Abnormal posture: Secondary | ICD-10-CM | POA: Diagnosis not present

## 2014-11-11 DIAGNOSIS — M2560 Stiffness of unspecified joint, not elsewhere classified: Secondary | ICD-10-CM

## 2014-11-11 NOTE — Therapy (Signed)
Outpatient Rehabilitation [facility] [address] [place], [place], [address] Phone: [phone]   Fax:  [phone]  Physical Therapy Treatment  Patient Details  Name: Christina Sellers MRN: [id number] Date of Birth: [birth year]  Encounter Date: 11/11/2014      PT End of Session - 11/11/14 1542    Visit Number 16   Number of Visits 16   Date for PT Re-Evaluation 11/13/14   PT Start Time 6962   PT Stop Time 1443   PT Time Calculation (min) 38 min   Activity Tolerance Patient tolerated treatment well   Behavior During Therapy [facility] for tasks assessed/performed      Past Medical History  Diagnosis Date  . Osteopenia   . Recurrent vaginitis   . History of recurrent UTIs   . Hypertension   . Elevated cholesterol   . Thyroid disease   . Labial fusion   . Depression   . Anxiety   . GERD (gastroesophageal reflux disease)   . DJD (degenerative joint disease)   . Osteoarthritis of left knee   . Hypothyroidism   . Cellulitis   . IBS (irritable bowel syndrome)   . Allergic sinusitis   . Insomnia   . Atherosclerotic peripheral vascular disease   . Carotid artery stenosis   . Elevated MCV     secondary to alchol  . Abnormal Pap smear of vagina   . COPD with asthma   . Tendonitis   . Gout   . Sjogren's syndrome   . Syncope 11.7.14    secondary to dehydration and possible hypoglycemia    Past Surgical History  Procedure Laterality Date  . Tonsillectomy    . Cholecystectomy    . Knee surgery    . Eye surgery      There were no vitals taken for this visit.  Visit Diagnosis:  Right-sided low back pain without sciatica  Pain in joint, pelvic region and thigh, left  Difficulty in walking  Joint stiffness of spine      Subjective Assessment - 11/11/14 1504    Symptoms No pain at moment.   Last painthis AM with walking. Sleeping bette now not waking with pain last few nights   Pertinent History Lower back pain with activity.    Currently in Pain?  No/denies   Pain Score 0-No pain   Pain Onset More than a month ago   Multiple Pain Sites No            [facility] Adult PT Treatment/Exercise - 11/11/14 1505    Lumbar Exercises: Stretches   Single Knee to Chest Stretch 2 reps;30 seconds  RT and LT   Lower Trunk Rotation 2 reps;30 seconds  RT nad LT   Lumbar Exercises: Aerobic   Elliptical Nustep L5 6 min arms at 8   Ultrasound   Ultrasound Location RT hip   Ultrasound Parameters 100%, 1MHZ, 1,6 Wcm2   Ultrasound Goals Pain   Manual Therapy   Joint Mobilization Gr 3 axial distarctions Rtr leg   Passive ROM RT hip extension and ITB stretch   Manual Therapy --  STW to RT gluteals          PT Education - 11/11/14 1541    Education provided No              Plan - 11/11/14 1544    Clinical Impression Statement Mch improed function and decreased pain and tenderness   PT Frequency 1x / week   PT Duration --  1  more visit then DC   PT Treatment/Interventions Ultrasound;Manual techniques;Therapeutic exercise;Moist Heat   PT Next Visit Plan DStretch and modalities   Consulted and Agree with Plan of Care Patient   PT Plan 1 more visit then DC                               Problem List Patient Active Problem List   Diagnosis Date Noted  . Atherosclerotic peripheral vascular disease   . Essential hypertension, benign 11/22/2013  . Osteopenia 11/22/2013  . Labial fusion     Darrel Hoover PT 11/11/2014, 3:46 PM

## 2014-11-12 DIAGNOSIS — Z1231 Encounter for screening mammogram for malignant neoplasm of breast: Secondary | ICD-10-CM | POA: Diagnosis not present

## 2014-11-12 DIAGNOSIS — Z803 Family history of malignant neoplasm of breast: Secondary | ICD-10-CM | POA: Diagnosis not present

## 2014-11-13 ENCOUNTER — Encounter: Payer: Self-pay | Admitting: Women's Health

## 2014-11-14 ENCOUNTER — Ambulatory Visit: Payer: Medicare Other

## 2014-11-14 DIAGNOSIS — R262 Difficulty in walking, not elsewhere classified: Secondary | ICD-10-CM

## 2014-11-14 DIAGNOSIS — M545 Low back pain, unspecified: Secondary | ICD-10-CM

## 2014-11-14 DIAGNOSIS — M2560 Stiffness of unspecified joint, not elsewhere classified: Secondary | ICD-10-CM

## 2014-11-14 DIAGNOSIS — Z5189 Encounter for other specified aftercare: Secondary | ICD-10-CM | POA: Diagnosis not present

## 2014-11-14 NOTE — Patient Instructions (Signed)
I asked Ms Rufo to be in touch with Dr Rita Ohara to inform him of her new symptoms.

## 2014-11-14 NOTE — Therapy (Signed)
Outpatient Rehabilitation [facility] [address] [place], [place], [address] Phone: [phone]   Fax:  [phone]  Physical Therapy Treatment  Patient Details  Name: Christina Sellers MRN: [id number] Date of Birth: [birth year]  Encounter Date: 11/14/2014      PT End of Session - 11/14/14 1623    Visit Number 17   Number of Visits 17   Activity Tolerance Patient limited by pain   Behavior During Therapy [facility] for tasks assessed/performed      Past Medical History  Diagnosis Date  . Osteopenia   . Recurrent vaginitis   . History of recurrent UTIs   . Hypertension   . Elevated cholesterol   . Thyroid disease   . Labial fusion   . Depression   . Anxiety   . GERD (gastroesophageal reflux disease)   . DJD (degenerative joint disease)   . Osteoarthritis of left knee   . Hypothyroidism   . Cellulitis   . IBS (irritable bowel syndrome)   . Allergic sinusitis   . Insomnia   . Atherosclerotic peripheral vascular disease   . Carotid artery stenosis   . Elevated MCV     secondary to alchol  . Abnormal Pap smear of vagina   . COPD with asthma   . Tendonitis   . Gout   . Sjogren's syndrome   . Syncope 11.7.14    secondary to dehydration and possible hypoglycemia    Past Surgical History  Procedure Laterality Date  . Tonsillectomy    . Cholecystectomy    . Knee surgery    . Eye surgery      There were no vitals taken for this visit.  Visit Diagnosis:  Right-sided low back pain without sciatica  Difficulty in walking  Joint stiffness of spine      Subjective Assessment - 11/14/14 1505    Symptoms In pain today. Have tinglinhg to toes with SLR and sitting knee extension   Currently in Pain? Yes   Pain Score 0-No pain  None with sitting. Discomfort across lower back   Pain Location Back   Pain Orientation Left;Right   Pain Descriptors / Indicators Discomfort   Pain Type Chronic pain   Pain Onset More than a month ago   Pain Frequency  Intermittent   Aggravating Factors  Walking and standing   Pain Relieving Factors Sitting andd rest   Multiple Pain Sites No            [facility] Adult PT Treatment/Exercise - 11/14/14 1621    Ultrasound   Ultrasound Location --  lower back   Ultrasound Parameters 100% 1MHZ 1.6 Wcm2   Manual Therapy   Manual Therapy Massage  to lower back          PT Education - 11/14/14 1623    Education provided No          PT Short Term Goals - 11/14/14 1507    PT SHORT TERM GOAL #1   Title Independent wiht initial HEP   PT SHORT TERM GOAL #3   Title report 40% improved with walking up and down stairs   Status Achieved          PT Long Term Goals - 11/14/14 1508    PT LONG TERM GOAL #1   Title Demonstrate undersanidng of posture and body mechanics   Status Achieved   PT LONG TERM GOAL #2   Title independent with advanced HEP    Status Achieved   PT LONG TERM GOAL #  3   Title report pain decr. 50% or more  with normal  daily activity   Baseline 40% better   Status Not Met   PT LONG TERM GOAL #4   Title report5 50% decr. pain with walking stairs   Status Not Met   PT LONG TERM GOAL #5   Title report 50% decr pain with walking in community   Status Not Met          Plan - 07-Dec-2014 1623    Clinical Impression Statement She is improved but the goals of tolerance to standing and walking were not met sufficiently to continue PT   PT Plan Discharge to MD          G-Codes - 12/07/2014 1625    Functional Assessment Tool Used FOTO   Functional Limitation Mobility: Walking and moving around   Mobility: Walking and Moving Around Goal Status [phone]) At least 40 percent but less than 60 percent impaired, limited or restricted   Mobility: Walking and Moving Around Discharge Status [phone]) At least 40 percent but less than 60 percent impaired, limited or restricted                             Problem List Patient Active Problem List   Diagnosis Date  Noted  . Atherosclerotic peripheral vascular disease   . Essential hypertension, benign 11/22/2013  . Osteopenia 11/22/2013  . Labial fusion     Darrel Hoover   PT 12/07/2014, 4:26 PM  PHYSICAL THERAPY DISCHARGE SUMMARY  Visits from Start of Care: 17  Current functional level related to goals / functional outcomes: See Above. She made some improvemens but is still significantly limited   Remaining deficits: limited tolerance to sitting and standing due to pain   Education / Equipment: HEP Plan: Patient agrees to discharge.  Patient goals were partially met. Patient is being discharged due to lack of progress.  ?????

## 2014-11-18 DIAGNOSIS — E039 Hypothyroidism, unspecified: Secondary | ICD-10-CM | POA: Diagnosis not present

## 2014-11-18 DIAGNOSIS — R221 Localized swelling, mass and lump, neck: Secondary | ICD-10-CM | POA: Diagnosis not present

## 2014-11-25 ENCOUNTER — Ambulatory Visit (INDEPENDENT_AMBULATORY_CARE_PROVIDER_SITE_OTHER): Payer: Medicare Other | Admitting: Women's Health

## 2014-11-25 ENCOUNTER — Encounter: Payer: Self-pay | Admitting: Women's Health

## 2014-11-25 VITALS — BP 124/60 | Ht 59.0 in | Wt 160.0 lb

## 2014-11-25 DIAGNOSIS — Z01411 Encounter for gynecological examination (general) (routine) with abnormal findings: Secondary | ICD-10-CM

## 2014-11-25 DIAGNOSIS — Q525 Fusion of labia: Secondary | ICD-10-CM | POA: Diagnosis not present

## 2014-11-25 DIAGNOSIS — M8580 Other specified disorders of bone density and structure, unspecified site: Secondary | ICD-10-CM

## 2014-11-25 DIAGNOSIS — N644 Mastodynia: Secondary | ICD-10-CM | POA: Diagnosis not present

## 2014-11-25 NOTE — Patient Instructions (Signed)
Health Recommendations for Postmenopausal Women Respected and ongoing research has looked at the most common causes of death, disability, and poor quality of life in postmenopausal women. The causes include heart disease, diseases of blood vessels, diabetes, depression, cancer, and bone loss (osteoporosis). Many things can be done to help lower the chances of developing these and other common problems. CARDIOVASCULAR DISEASE Heart Disease: A heart attack is a medical emergency. Know the signs and symptoms of a heart attack. Below are things women can do to reduce their risk for heart disease.   Do not smoke. If you smoke, quit.  Aim for a healthy weight. Being overweight causes many preventable deaths. Eat a healthy and balanced diet and drink an adequate amount of liquids.  Get moving. Make a commitment to be more physically active. Aim for 30 minutes of activity on most, if not all days of the week.  Eat for heart health. Choose a diet that is low in saturated fat and cholesterol and eliminate trans fat. Include whole grains, vegetables, and fruits. Read and understand the labels on food containers before buying.  Know your numbers. Ask your caregiver to check your blood pressure, cholesterol (total, HDL, LDL, triglycerides) and blood glucose. Work with your caregiver on improving your entire clinical picture.  High blood pressure. Limit or stop your table salt intake (try salt substitute and food seasonings). Avoid salty foods and drinks. Read labels on food containers before buying. Eating well and exercising can help control high blood pressure. STROKE  Stroke is a medical emergency. Stroke may be the result of a blood clot in a blood vessel in the brain or by a brain hemorrhage (bleeding). Know the signs and symptoms of a stroke. To lower the risk of developing a stroke:  Avoid fatty foods.  Quit smoking.  Control your diabetes, blood pressure, and irregular heart rate. THROMBOPHLEBITIS  (BLOOD CLOT) OF THE LEG  Becoming overweight and leading a stationary lifestyle may also contribute to developing blood clots. Controlling your diet and exercising will help lower the risk of developing blood clots. CANCER SCREENING  Breast Cancer: Take steps to reduce your risk of breast cancer.  You should practice "breast self-awareness." This means understanding the normal appearance and feel of your breasts and should include breast self-examination. Any changes detected, no matter how small, should be reported to your caregiver.  After age 40, you should have a clinical breast exam (CBE) every year.  Starting at age 40, you should consider having a mammogram (breast X-ray) every year.  If you have a family history of breast cancer, talk to your caregiver about genetic screening.  If you are at high risk for breast cancer, talk to your caregiver about having an MRI and a mammogram every year.  Intestinal or Stomach Cancer: Tests to consider are a rectal exam, fecal occult blood, sigmoidoscopy, and colonoscopy. Women who are high risk may need to be screened at an earlier age and more often.  Cervical Cancer:  Beginning at age 30, you should have a Pap test every 3 years as long as the past 3 Pap tests have been normal.  If you have had past treatment for cervical cancer or a condition that could lead to cancer, you need Pap tests and screening for cancer for at least 20 years after your treatment.  If you had a hysterectomy for a problem that was not cancer or a condition that could lead to cancer, then you no longer need Pap tests.    If you are between ages 65 and 70, and you have had normal Pap tests going back 10 years, you no longer need Pap tests.  If Pap tests have been discontinued, risk factors (such as a new sexual partner) need to be reassessed to determine if screening should be resumed.  Some medical problems can increase the chance of getting cervical cancer. In these  cases, your caregiver may recommend more frequent screening and Pap tests.  Uterine Cancer: If you have vaginal bleeding after reaching menopause, you should notify your caregiver.  Ovarian Cancer: Other than yearly pelvic exams, there are no reliable tests available to screen for ovarian cancer at this time except for yearly pelvic exams.  Lung Cancer: Yearly chest X-rays can detect lung cancer and should be done on high risk women, such as cigarette smokers and women with chronic lung disease (emphysema).  Skin Cancer: A complete body skin exam should be done at your yearly examination. Avoid overexposure to the sun and ultraviolet light lamps. Use a strong sun block cream when in the sun. All of these things are important for lowering the risk of skin cancer. MENOPAUSE Menopause Symptoms: Hormone therapy products are effective for treating symptoms associated with menopause:  Moderate to severe hot flashes.  Night sweats.  Mood swings.  Headaches.  Tiredness.  Loss of sex drive.  Insomnia.  Other symptoms. Hormone replacement carries certain risks, especially in older women. Women who use or are thinking about using estrogen or estrogen with progestin treatments should discuss that with their caregiver. Your caregiver will help you understand the benefits and risks. The ideal dose of hormone replacement therapy is not known. The Food and Drug Administration (FDA) has concluded that hormone therapy should be used only at the lowest doses and for the shortest amount of time to reach treatment goals.  OSTEOPOROSIS Protecting Against Bone Loss and Preventing Fracture If you use hormone therapy for prevention of bone loss (osteoporosis), the risks for bone loss must outweigh the risk of the therapy. Ask your caregiver about other medications known to be safe and effective for preventing bone loss and fractures. To guard against bone loss or fractures, the following is recommended:  If  you are younger than age 50, take 1000 mg of calcium and at least 600 mg of Vitamin D per day.  If you are older than age 50 but younger than age 70, take 1200 mg of calcium and at least 600 mg of Vitamin D per day.  If you are older than age 70, take 1200 mg of calcium and at least 800 mg of Vitamin D per day. Smoking and excessive alcohol intake increases the risk of osteoporosis. Eat foods rich in calcium and vitamin D and do weight bearing exercises several times a week as your caregiver suggests. DIABETES Diabetes Mellitus: If you have type I or type 2 diabetes, you should keep your blood sugar under control with diet, exercise, and recommended medication. Avoid starchy and fatty foods, and too many sweets. Being overweight can make diabetes control more difficult. COGNITION AND MEMORY Cognition and Memory: Menopausal hormone therapy is not recommended for the prevention of cognitive disorders such as Alzheimer's disease or memory loss.  DEPRESSION  Depression may occur at any age, but it is common in elderly women. This may be because of physical, medical, social (loneliness), or financial problems and needs. If you are experiencing depression because of medical problems and control of symptoms, talk to your caregiver about this. Physical   activity and exercise may help with mood and sleep. Community and volunteer involvement may improve your sense of value and worth. If you have depression and you feel that the problem is getting worse or becoming severe, talk to your caregiver about which treatment options are best for you. ACCIDENTS  Accidents are common and can be serious in elderly woman. Prepare your house to prevent accidents. Eliminate throw rugs, place hand bars in bath, shower, and toilet areas. Avoid wearing high heeled shoes or walking on wet, snowy, and icy areas. Limit or stop driving if you have vision or hearing problems, or if you feel you are unsteady with your movements and  reflexes. HEPATITIS C Hepatitis C is a type of viral infection affecting the liver. It is spread mainly through contact with blood from an infected person. It can be treated, but if left untreated, it can lead to severe liver damage over the years. Many people who are infected do not know that the virus is in their blood. If you are a "baby-boomer", it is recommended that you have one screening test for Hepatitis C. IMMUNIZATIONS  Several immunizations are important to consider having during your senior years, including:   Tetanus, diphtheria, and pertussis booster shot.  Influenza every year before the flu season begins.  Pneumonia vaccine.  Shingles vaccine.  Others, as indicated based on your specific needs. Talk to your caregiver about these. Document Released: 01/14/2006 Document Revised: 04/08/2014 Document Reviewed: 09/09/2008 ExitCare Patient Information 2015 [employer]. This information is not intended to replace advice given to you by your health care provider. Make sure you discuss any questions you have with your health care provider.  

## 2014-11-25 NOTE — Progress Notes (Signed)
Christina Sellers 1940/12/07 [id number]    History:    Presents for annual exam.  Postmenopausal/no HRT/no bleeding. History of labial fusion had used topical estrogen but has not used and has noted no difference. Has had normal Pap and mammograms. The continues to have left breast tenderness especially deep in breast. Normal 3-D mammogram 11/2014 requesting ultrasound. Sr. with known breast cancer. Hypothyroid primary care manages. Had been on an antihypertensive is no longer on. 2015 T score -1.7 right femoral neck FRAX 17%/3.5%. Chronic back pain in therapy.  Past medical history, past surgical history, family history and social history were all reviewed and documented in the EPIC chart.  ROS:  A ROS was performed and pertinent positives and negatives are included.  Exam:  Filed Vitals:   11/25/14 1114  BP: 124/60    General appearance:  Normal Thyroid:  Symmetrical, normal in size, without palpable masses or nodularity. Respiratory  Auscultation:  Clear without wheezing or rhonchi Cardiovascular  Auscultation:  Regular rate, without rubs, murmurs or gallops  Edema/varicosities:  Not grossly evident Abdominal  Soft,nontender, without masses, guarding or rebound.  Liver/spleen:  No organomegaly noted  Hernia:  None appreciated  Skin  Inspection:  Grossly normal   Breasts: Examined lying and sitting.     Right: Without masses, retractions, discharge or axillary adenopathy.     Left: Without masses, retractions, discharge or axillary adenopathy. Gentitourinary   Inguinal/mons:  Normal without inguinal adenopathy  External genitalia:  Normal  BUS/Urethra/Skene's glands:  Normal  Vagina:  Atrophic labial fusion  Cervix:  Normal  Uterus:  normal in size, shape and contour.  Midline and mobile  Adnexa/parametria:     Rt: Without masses or tenderness.   Lt: Without masses or tenderness.  Anus and perineum: Normal  Digital rectal exam: Normal sphincter tone without palpated  masses or tenderness  Assessment/Plan:  73 y.o. WWF G2P2 for annual gyn exam.   Postmenopausal/no HRT/no bleeding Labial fusion - asymptomatic Persistent left breast mastodynia Hypothyroid primary care manages labs and meds Osteopenia with elevated FRAX/chronic back pain currently in therapy  Plan: Left breast ultrasound, will schedule. SBE's, continue annual 3-D mammogram history of dense breasts. Vitamin E twice daily, calcium rich diet, vitamin D 2000 daily encouraged. Current on vaccines. Not sure if has had Zostavax will check with primary care. Home safety, fall prevention  and importance of regular exercise discussed. DEXA per primary care. Options for labial fusion reviewed declines treatment at this time. A and D ointment 1-2 times daily for perineal irritation.   Huel [facility], 12:09 PM 11/25/2014

## 2014-11-26 ENCOUNTER — Telehealth: Payer: Self-pay | Admitting: *Deleted

## 2014-11-26 NOTE — Telephone Encounter (Signed)
Appointment on 12/17/14 @ 11:00 am at [facility] pt informed.

## 2014-11-26 NOTE — Telephone Encounter (Signed)
-----   Message from Huel Cote, NP sent at 11/25/2014 12:12 PM EST ----- Anderson Malta, please schedule Christina Sellers a left breast ultrasound has had  discomfort/pain behind areola, normal 3-D mammogram 11/2014. Sr. with breast cancer age 73. States pain is increased after sleeping on abdomen. No pain on right breast. Will be out of town schedule week of January 4.

## 2014-12-09 DIAGNOSIS — N644 Mastodynia: Secondary | ICD-10-CM | POA: Diagnosis not present

## 2014-12-10 ENCOUNTER — Encounter: Payer: Self-pay | Admitting: Women's Health

## 2014-12-13 DIAGNOSIS — Z6832 Body mass index (BMI) 32.0-32.9, adult: Secondary | ICD-10-CM | POA: Diagnosis not present

## 2014-12-13 DIAGNOSIS — M545 Low back pain: Secondary | ICD-10-CM | POA: Diagnosis not present

## 2014-12-13 DIAGNOSIS — R030 Elevated blood-pressure reading, without diagnosis of hypertension: Secondary | ICD-10-CM | POA: Diagnosis not present

## 2014-12-13 DIAGNOSIS — M4806 Spinal stenosis, lumbar region: Secondary | ICD-10-CM | POA: Diagnosis not present

## 2014-12-13 DIAGNOSIS — M5136 Other intervertebral disc degeneration, lumbar region: Secondary | ICD-10-CM | POA: Diagnosis not present

## 2014-12-13 DIAGNOSIS — M4316 Spondylolisthesis, lumbar region: Secondary | ICD-10-CM | POA: Diagnosis not present

## 2014-12-13 DIAGNOSIS — M4726 Other spondylosis with radiculopathy, lumbar region: Secondary | ICD-10-CM | POA: Diagnosis not present

## 2014-12-19 DIAGNOSIS — M5136 Other intervertebral disc degeneration, lumbar region: Secondary | ICD-10-CM | POA: Diagnosis not present

## 2014-12-19 DIAGNOSIS — M4806 Spinal stenosis, lumbar region: Secondary | ICD-10-CM | POA: Diagnosis not present

## 2014-12-19 DIAGNOSIS — M47816 Spondylosis without myelopathy or radiculopathy, lumbar region: Secondary | ICD-10-CM | POA: Diagnosis not present

## 2014-12-20 DIAGNOSIS — I10 Essential (primary) hypertension: Secondary | ICD-10-CM | POA: Diagnosis not present

## 2014-12-20 DIAGNOSIS — F329 Major depressive disorder, single episode, unspecified: Secondary | ICD-10-CM | POA: Diagnosis not present

## 2014-12-20 DIAGNOSIS — N183 Chronic kidney disease, stage 3 (moderate): Secondary | ICD-10-CM | POA: Diagnosis not present

## 2014-12-20 DIAGNOSIS — E538 Deficiency of other specified B group vitamins: Secondary | ICD-10-CM | POA: Diagnosis not present

## 2014-12-20 DIAGNOSIS — F419 Anxiety disorder, unspecified: Secondary | ICD-10-CM | POA: Diagnosis not present

## 2014-12-20 DIAGNOSIS — E039 Hypothyroidism, unspecified: Secondary | ICD-10-CM | POA: Diagnosis not present

## 2015-01-03 DIAGNOSIS — M2012 Hallux valgus (acquired), left foot: Secondary | ICD-10-CM | POA: Diagnosis not present

## 2015-01-03 DIAGNOSIS — M2042 Other hammer toe(s) (acquired), left foot: Secondary | ICD-10-CM | POA: Diagnosis not present

## 2015-01-22 ENCOUNTER — Encounter: Payer: Self-pay | Admitting: Women's Health

## 2015-01-22 ENCOUNTER — Ambulatory Visit (INDEPENDENT_AMBULATORY_CARE_PROVIDER_SITE_OTHER): Payer: Medicare Other | Admitting: Women's Health

## 2015-01-22 VITALS — BP 134/80 | Wt 163.0 lb

## 2015-01-22 DIAGNOSIS — N898 Other specified noninflammatory disorders of vagina: Secondary | ICD-10-CM

## 2015-01-22 DIAGNOSIS — R350 Frequency of micturition: Secondary | ICD-10-CM | POA: Diagnosis not present

## 2015-01-22 LAB — URINALYSIS W MICROSCOPIC + REFLEX CULTURE
BILIRUBIN URINE: NEGATIVE
GLUCOSE, UA: NEGATIVE mg/dL
Hgb urine dipstick: NEGATIVE
KETONES UR: NEGATIVE mg/dL
Leukocytes, UA: NEGATIVE
NITRITE: NEGATIVE
Specific Gravity, Urine: 1.015 (ref 1.005–1.030)
Urobilinogen, UA: 0.2 mg/dL (ref 0.0–1.0)
pH: 7 (ref 5.0–8.0)

## 2015-01-22 LAB — WET PREP FOR TRICH, YEAST, CLUE
Clue Cells Wet Prep HPF POC: NONE SEEN
TRICH WET PREP: NONE SEEN
WBC, Wet Prep HPF POC: NONE SEEN
Yeast Wet Prep HPF POC: NONE SEEN

## 2015-01-22 NOTE — Progress Notes (Signed)
Patient ID: Christina Sellers, female   DOB: 1941-08-25, 74 y.o.   MRN: [id number] Presents with complaint of urinary frequency with odor with urination for the last several days, no odor today. Denies pain or burning with urination, fever or  vaginal itching/ discharge. History of labial fusion with no difficulty with urination. Not sexually active. Chronic back pain, no change.  Exam: Appears well. External genitalia labial fusion, wet prep done with a Q-tip, wet prep negative. UA: Negative.  Urinary frequency  Plan: Urine culture, will triage based on results. Increase fluids.

## 2015-01-23 LAB — URINE CULTURE
Colony Count: NO GROWTH
Organism ID, Bacteria: NO GROWTH

## 2015-02-03 ENCOUNTER — Other Ambulatory Visit: Payer: Self-pay | Admitting: Internal Medicine

## 2015-02-03 ENCOUNTER — Ambulatory Visit
Admission: RE | Admit: 2015-02-03 | Discharge: 2015-02-03 | Disposition: A | Discharge status: Home / Self Care | Payer: Medicare Other | Source: Ambulatory Visit | Attending: Internal Medicine | Admitting: Internal Medicine

## 2015-02-03 DIAGNOSIS — R0602 Shortness of breath: Secondary | ICD-10-CM | POA: Diagnosis not present

## 2015-02-03 DIAGNOSIS — E039 Hypothyroidism, unspecified: Secondary | ICD-10-CM | POA: Diagnosis not present

## 2015-02-03 DIAGNOSIS — R61 Generalized hyperhidrosis: Secondary | ICD-10-CM

## 2015-02-04 DIAGNOSIS — R61 Generalized hyperhidrosis: Secondary | ICD-10-CM | POA: Diagnosis not present

## 2015-02-17 DIAGNOSIS — M5136 Other intervertebral disc degeneration, lumbar region: Secondary | ICD-10-CM | POA: Diagnosis not present

## 2015-02-17 DIAGNOSIS — R61 Generalized hyperhidrosis: Secondary | ICD-10-CM | POA: Diagnosis not present

## 2015-02-17 DIAGNOSIS — E039 Hypothyroidism, unspecified: Secondary | ICD-10-CM | POA: Diagnosis not present

## 2015-02-17 DIAGNOSIS — R739 Hyperglycemia, unspecified: Secondary | ICD-10-CM | POA: Diagnosis not present

## 2015-02-17 DIAGNOSIS — I10 Essential (primary) hypertension: Secondary | ICD-10-CM | POA: Diagnosis not present

## 2015-02-17 DIAGNOSIS — N183 Chronic kidney disease, stage 3 (moderate): Secondary | ICD-10-CM | POA: Diagnosis not present

## 2015-02-20 DIAGNOSIS — M2042 Other hammer toe(s) (acquired), left foot: Secondary | ICD-10-CM | POA: Diagnosis not present

## 2015-02-20 DIAGNOSIS — M2012 Hallux valgus (acquired), left foot: Secondary | ICD-10-CM | POA: Diagnosis not present

## 2015-03-25 DIAGNOSIS — R61 Generalized hyperhidrosis: Secondary | ICD-10-CM | POA: Diagnosis not present

## 2015-03-25 DIAGNOSIS — E039 Hypothyroidism, unspecified: Secondary | ICD-10-CM | POA: Diagnosis not present

## 2015-04-01 DIAGNOSIS — M7742 Metatarsalgia, left foot: Secondary | ICD-10-CM | POA: Diagnosis not present

## 2015-04-01 DIAGNOSIS — Q662 Congenital metatarsus (primus) varus: Secondary | ICD-10-CM | POA: Diagnosis not present

## 2015-04-01 DIAGNOSIS — G8918 Other acute postprocedural pain: Secondary | ICD-10-CM | POA: Diagnosis not present

## 2015-04-01 DIAGNOSIS — M2012 Hallux valgus (acquired), left foot: Secondary | ICD-10-CM | POA: Diagnosis not present

## 2015-04-01 DIAGNOSIS — M2042 Other hammer toe(s) (acquired), left foot: Secondary | ICD-10-CM | POA: Diagnosis not present

## 2015-04-01 DIAGNOSIS — M205X2 Other deformities of toe(s) (acquired), left foot: Secondary | ICD-10-CM | POA: Diagnosis not present

## 2015-04-03 DIAGNOSIS — Z4789 Encounter for other orthopedic aftercare: Secondary | ICD-10-CM | POA: Diagnosis not present

## 2015-04-03 DIAGNOSIS — M2042 Other hammer toe(s) (acquired), left foot: Secondary | ICD-10-CM | POA: Diagnosis not present

## 2015-04-16 DIAGNOSIS — Z4789 Encounter for other orthopedic aftercare: Secondary | ICD-10-CM | POA: Diagnosis not present

## 2015-04-30 DIAGNOSIS — M2042 Other hammer toe(s) (acquired), left foot: Secondary | ICD-10-CM | POA: Diagnosis not present

## 2015-04-30 DIAGNOSIS — Z4789 Encounter for other orthopedic aftercare: Secondary | ICD-10-CM | POA: Diagnosis not present

## 2015-05-07 DIAGNOSIS — D1801 Hemangioma of skin and subcutaneous tissue: Secondary | ICD-10-CM | POA: Diagnosis not present

## 2015-05-07 DIAGNOSIS — L853 Xerosis cutis: Secondary | ICD-10-CM | POA: Diagnosis not present

## 2015-05-07 DIAGNOSIS — D225 Melanocytic nevi of trunk: Secondary | ICD-10-CM | POA: Diagnosis not present

## 2015-05-07 DIAGNOSIS — L814 Other melanin hyperpigmentation: Secondary | ICD-10-CM | POA: Diagnosis not present

## 2015-05-07 DIAGNOSIS — L821 Other seborrheic keratosis: Secondary | ICD-10-CM | POA: Diagnosis not present

## 2015-05-28 DIAGNOSIS — M2042 Other hammer toe(s) (acquired), left foot: Secondary | ICD-10-CM | POA: Diagnosis not present

## 2015-05-28 DIAGNOSIS — Z4789 Encounter for other orthopedic aftercare: Secondary | ICD-10-CM | POA: Diagnosis not present

## 2015-06-18 DIAGNOSIS — Z4789 Encounter for other orthopedic aftercare: Secondary | ICD-10-CM | POA: Diagnosis not present

## 2015-06-18 DIAGNOSIS — M2041 Other hammer toe(s) (acquired), right foot: Secondary | ICD-10-CM | POA: Diagnosis not present

## 2015-06-18 DIAGNOSIS — M2012 Hallux valgus (acquired), left foot: Secondary | ICD-10-CM | POA: Diagnosis not present

## 2015-06-30 DIAGNOSIS — M2012 Hallux valgus (acquired), left foot: Secondary | ICD-10-CM | POA: Diagnosis not present

## 2015-07-04 DIAGNOSIS — M2012 Hallux valgus (acquired), left foot: Secondary | ICD-10-CM | POA: Diagnosis not present

## 2015-07-07 DIAGNOSIS — M2012 Hallux valgus (acquired), left foot: Secondary | ICD-10-CM | POA: Diagnosis not present

## 2015-07-14 DIAGNOSIS — M2012 Hallux valgus (acquired), left foot: Secondary | ICD-10-CM | POA: Diagnosis not present

## 2015-07-17 DIAGNOSIS — M2012 Hallux valgus (acquired), left foot: Secondary | ICD-10-CM | POA: Diagnosis not present

## 2015-07-21 DIAGNOSIS — M2012 Hallux valgus (acquired), left foot: Secondary | ICD-10-CM | POA: Diagnosis not present

## 2015-07-21 DIAGNOSIS — Z4789 Encounter for other orthopedic aftercare: Secondary | ICD-10-CM | POA: Diagnosis not present

## 2015-07-23 DIAGNOSIS — H01001 Unspecified blepharitis right upper eyelid: Secondary | ICD-10-CM | POA: Diagnosis not present

## 2015-07-23 DIAGNOSIS — H04123 Dry eye syndrome of bilateral lacrimal glands: Secondary | ICD-10-CM | POA: Diagnosis not present

## 2015-07-23 DIAGNOSIS — H02055 Trichiasis without entropian left lower eyelid: Secondary | ICD-10-CM | POA: Diagnosis not present

## 2015-07-23 DIAGNOSIS — H02052 Trichiasis without entropian right lower eyelid: Secondary | ICD-10-CM | POA: Diagnosis not present

## 2015-07-23 DIAGNOSIS — M2012 Hallux valgus (acquired), left foot: Secondary | ICD-10-CM | POA: Diagnosis not present

## 2015-07-23 DIAGNOSIS — H01005 Unspecified blepharitis left lower eyelid: Secondary | ICD-10-CM | POA: Diagnosis not present

## 2015-07-23 DIAGNOSIS — H01004 Unspecified blepharitis left upper eyelid: Secondary | ICD-10-CM | POA: Diagnosis not present

## 2015-07-23 DIAGNOSIS — H01002 Unspecified blepharitis right lower eyelid: Secondary | ICD-10-CM | POA: Diagnosis not present

## 2015-07-30 DIAGNOSIS — M2012 Hallux valgus (acquired), left foot: Secondary | ICD-10-CM | POA: Diagnosis not present

## 2015-08-01 DIAGNOSIS — M2012 Hallux valgus (acquired), left foot: Secondary | ICD-10-CM | POA: Diagnosis not present

## 2015-08-08 DIAGNOSIS — M2012 Hallux valgus (acquired), left foot: Secondary | ICD-10-CM | POA: Diagnosis not present

## 2015-08-22 DIAGNOSIS — M2012 Hallux valgus (acquired), left foot: Secondary | ICD-10-CM | POA: Diagnosis not present

## 2015-09-12 DIAGNOSIS — Z23 Encounter for immunization: Secondary | ICD-10-CM | POA: Diagnosis not present

## 2015-09-23 DIAGNOSIS — R42 Dizziness and giddiness: Secondary | ICD-10-CM | POA: Diagnosis not present

## 2015-10-03 DIAGNOSIS — E039 Hypothyroidism, unspecified: Secondary | ICD-10-CM | POA: Diagnosis not present

## 2015-10-03 DIAGNOSIS — I10 Essential (primary) hypertension: Secondary | ICD-10-CM | POA: Diagnosis not present

## 2015-10-03 DIAGNOSIS — R739 Hyperglycemia, unspecified: Secondary | ICD-10-CM | POA: Diagnosis not present

## 2015-10-03 DIAGNOSIS — Z6832 Body mass index (BMI) 32.0-32.9, adult: Secondary | ICD-10-CM | POA: Diagnosis not present

## 2015-10-03 DIAGNOSIS — E669 Obesity, unspecified: Secondary | ICD-10-CM | POA: Diagnosis not present

## 2015-10-03 DIAGNOSIS — Z1389 Encounter for screening for other disorder: Secondary | ICD-10-CM | POA: Diagnosis not present

## 2015-10-03 DIAGNOSIS — Z0001 Encounter for general adult medical examination with abnormal findings: Secondary | ICD-10-CM | POA: Diagnosis not present

## 2015-10-03 DIAGNOSIS — N183 Chronic kidney disease, stage 3 (moderate): Secondary | ICD-10-CM | POA: Diagnosis not present

## 2015-10-03 DIAGNOSIS — M5136 Other intervertebral disc degeneration, lumbar region: Secondary | ICD-10-CM | POA: Diagnosis not present

## 2015-10-03 DIAGNOSIS — R42 Dizziness and giddiness: Secondary | ICD-10-CM | POA: Diagnosis not present

## 2015-10-22 DIAGNOSIS — Q6621 Congenital metatarsus primus varus: Secondary | ICD-10-CM | POA: Diagnosis not present

## 2015-10-22 DIAGNOSIS — Z4789 Encounter for other orthopedic aftercare: Secondary | ICD-10-CM | POA: Diagnosis not present

## 2015-11-13 DIAGNOSIS — H26493 Other secondary cataract, bilateral: Secondary | ICD-10-CM | POA: Diagnosis not present

## 2015-11-13 DIAGNOSIS — H04123 Dry eye syndrome of bilateral lacrimal glands: Secondary | ICD-10-CM | POA: Diagnosis not present

## 2015-11-13 DIAGNOSIS — Z961 Presence of intraocular lens: Secondary | ICD-10-CM | POA: Diagnosis not present

## 2015-11-27 ENCOUNTER — Ambulatory Visit (INDEPENDENT_AMBULATORY_CARE_PROVIDER_SITE_OTHER): Payer: Medicare Other | Admitting: Women's Health

## 2015-11-27 ENCOUNTER — Encounter: Payer: Self-pay | Admitting: Women's Health

## 2015-11-27 VITALS — BP 126/80 | Ht 59.0 in | Wt 165.0 lb

## 2015-11-27 DIAGNOSIS — Q525 Fusion of labia: Secondary | ICD-10-CM

## 2015-11-27 DIAGNOSIS — N952 Postmenopausal atrophic vaginitis: Secondary | ICD-10-CM

## 2015-11-27 DIAGNOSIS — Z01419 Encounter for gynecological examination (general) (routine) without abnormal findings: Secondary | ICD-10-CM | POA: Diagnosis not present

## 2015-11-27 NOTE — Progress Notes (Signed)
Christina Sellers 1941-02-15 [id number]    History:    Presents for breast and pelvic exam. Postmenopausal on no HRT had used vaginal estrogen cream for labial fusion but saw no relief. Normal Pap and mammogram history. Hypothyroid primary care manages. History of chronic back pain. Left foot surgery for a bunion and has had increased pain and problems since. History of hypertension in the past currently on no medication.  2015 DEXA T score -1.7 fax 17%/3.5%. Current on vaccines.  Past medical history, past surgical history, family history and social history were all reviewed and documented in the EPIC chart. Enjoys travel.  ROS:  A ROS was performed and pertinent positives and negatives are included.  Exam:  Filed Vitals:   11/27/15 1037  BP: 126/80    General appearance:  Normal Thyroid:  Symmetrical, normal in size, without palpable masses or nodularity. Respiratory  Auscultation:  Clear without wheezing or rhonchi Cardiovascular  Auscultation:  Regular rate, without rubs, murmurs or gallops  Edema/varicosities:  Not grossly evident Abdominal  Soft,nontender, without masses, guarding or rebound.  Liver/spleen:  No organomegaly noted  Hernia:  None appreciated  Skin  Inspection:  Grossly normal   Breasts: Examined lying and sitting.     Right: Without masses, retractions, discharge or axillary adenopathy.     Left: Without masses, retractions, discharge or axillary adenopathy. Gentitourinary   Inguinal/mons:  Normal without inguinal adenopathy  External genitalia: Labial fusion  BUS/Urethra/Skene's glands:  Normal  Vagina:  Atrophic  Cervix:  Normal  Uterus:  normal in size, shape and contour.  Midline and mobile  Adnexa/parametria:     Rt: Without masses or tenderness.   Lt: Without masses or tenderness.  Anus and perineum: Normal  Digital rectal exam: Normal sphincter tone without palpated masses or tenderness  Assessment/Plan:  74 y.o. WWF G2 P3 for breast and  pelvic exam.  Vaginal atrophy Labial fusion Hypothyroid-primary care manages labs and meds Osteopenia with elevated FRAX Chronic back pain and left foot pain  Plan: Reviewed options for labial fusion, reviewed may be best to try a small amount of vaginal estrogen externally twice weekly to maintain to help prevent further fusion. Estradiol 0.02% vaginal cream twice weekly small amount prescription given. Instructed to call if any difficulty urinating. Home safety, fall prevention and importance of continuing yoga and regular exercise. SBE's, continue annual screening 3-D mammogram history of dense breasts.     [place], 4:55 PM 11/27/2015

## 2016-01-12 DIAGNOSIS — Z1231 Encounter for screening mammogram for malignant neoplasm of breast: Secondary | ICD-10-CM | POA: Diagnosis not present

## 2016-01-12 DIAGNOSIS — M85851 Other specified disorders of bone density and structure, right thigh: Secondary | ICD-10-CM | POA: Diagnosis not present

## 2016-01-12 DIAGNOSIS — Z803 Family history of malignant neoplasm of breast: Secondary | ICD-10-CM | POA: Diagnosis not present

## 2016-01-14 ENCOUNTER — Encounter: Payer: Self-pay | Admitting: Women's Health

## 2016-01-15 ENCOUNTER — Encounter: Payer: Self-pay | Admitting: Women's Health

## 2016-01-26 DIAGNOSIS — M1712 Unilateral primary osteoarthritis, left knee: Secondary | ICD-10-CM | POA: Diagnosis not present

## 2016-01-26 DIAGNOSIS — M21612 Bunion of left foot: Secondary | ICD-10-CM | POA: Diagnosis not present

## 2016-02-19 ENCOUNTER — Other Ambulatory Visit: Payer: Self-pay | Admitting: Gastroenterology

## 2016-02-19 DIAGNOSIS — E039 Hypothyroidism, unspecified: Secondary | ICD-10-CM | POA: Diagnosis not present

## 2016-02-19 DIAGNOSIS — M2012 Hallux valgus (acquired), left foot: Secondary | ICD-10-CM | POA: Diagnosis not present

## 2016-02-19 DIAGNOSIS — M2042 Other hammer toe(s) (acquired), left foot: Secondary | ICD-10-CM | POA: Diagnosis not present

## 2016-02-19 DIAGNOSIS — K921 Melena: Secondary | ICD-10-CM | POA: Diagnosis not present

## 2016-02-19 DIAGNOSIS — R61 Generalized hyperhidrosis: Secondary | ICD-10-CM | POA: Diagnosis not present

## 2016-02-23 DIAGNOSIS — Q6621 Congenital metatarsus primus varus: Secondary | ICD-10-CM | POA: Diagnosis not present

## 2016-03-04 DIAGNOSIS — R61 Generalized hyperhidrosis: Secondary | ICD-10-CM | POA: Diagnosis not present

## 2016-03-04 DIAGNOSIS — K921 Melena: Secondary | ICD-10-CM | POA: Diagnosis not present

## 2016-03-04 DIAGNOSIS — E039 Hypothyroidism, unspecified: Secondary | ICD-10-CM | POA: Diagnosis not present

## 2016-03-15 ENCOUNTER — Encounter (HOSPITAL_COMMUNITY): Payer: Self-pay | Admitting: *Deleted

## 2016-03-23 ENCOUNTER — Encounter (HOSPITAL_COMMUNITY): Payer: Self-pay | Admitting: *Deleted

## 2016-03-23 ENCOUNTER — Ambulatory Visit (HOSPITAL_COMMUNITY): Payer: Medicare Other | Admitting: Certified Registered Nurse Anesthetist

## 2016-03-23 ENCOUNTER — Encounter (HOSPITAL_COMMUNITY)
Admission: RE | Disposition: A | Discharge status: Home / Self Care | Payer: Self-pay | Source: Ambulatory Visit | Attending: Gastroenterology

## 2016-03-23 ENCOUNTER — Ambulatory Visit (HOSPITAL_COMMUNITY)
Admission: RE | Admit: 2016-03-23 | Discharge: 2016-03-23 | Disposition: A | Discharge status: Home / Self Care | Payer: Medicare Other | Source: Ambulatory Visit | Attending: Gastroenterology | Admitting: Gastroenterology

## 2016-03-23 DIAGNOSIS — J449 Chronic obstructive pulmonary disease, unspecified: Secondary | ICD-10-CM | POA: Insufficient documentation

## 2016-03-23 DIAGNOSIS — M3500 Sicca syndrome, unspecified: Secondary | ICD-10-CM | POA: Insufficient documentation

## 2016-03-23 DIAGNOSIS — Z9049 Acquired absence of other specified parts of digestive tract: Secondary | ICD-10-CM | POA: Diagnosis not present

## 2016-03-23 DIAGNOSIS — Z87891 Personal history of nicotine dependence: Secondary | ICD-10-CM | POA: Diagnosis not present

## 2016-03-23 DIAGNOSIS — E039 Hypothyroidism, unspecified: Secondary | ICD-10-CM | POA: Diagnosis not present

## 2016-03-23 DIAGNOSIS — K921 Melena: Secondary | ICD-10-CM | POA: Diagnosis not present

## 2016-03-23 DIAGNOSIS — K5730 Diverticulosis of large intestine without perforation or abscess without bleeding: Secondary | ICD-10-CM | POA: Insufficient documentation

## 2016-03-23 DIAGNOSIS — Z1211 Encounter for screening for malignant neoplasm of colon: Secondary | ICD-10-CM | POA: Insufficient documentation

## 2016-03-23 DIAGNOSIS — I739 Peripheral vascular disease, unspecified: Secondary | ICD-10-CM | POA: Insufficient documentation

## 2016-03-23 DIAGNOSIS — Z6833 Body mass index (BMI) 33.0-33.9, adult: Secondary | ICD-10-CM | POA: Insufficient documentation

## 2016-03-23 DIAGNOSIS — E669 Obesity, unspecified: Secondary | ICD-10-CM | POA: Insufficient documentation

## 2016-03-23 DIAGNOSIS — I10 Essential (primary) hypertension: Secondary | ICD-10-CM | POA: Diagnosis not present

## 2016-03-23 DIAGNOSIS — K5790 Diverticulosis of intestine, part unspecified, without perforation or abscess without bleeding: Secondary | ICD-10-CM | POA: Diagnosis not present

## 2016-03-23 HISTORY — PX: COLONOSCOPY WITH PROPOFOL: SHX5780

## 2016-03-23 HISTORY — DX: Personal history of other medical treatment: Z92.89

## 2016-03-23 HISTORY — DX: Unspecified hearing loss, unspecified ear: H91.90

## 2016-03-23 SURGERY — COLONOSCOPY WITH PROPOFOL
Anesthesia: Monitor Anesthesia Care

## 2016-03-23 MED ORDER — PROPOFOL 500 MG/50ML IV EMUL
INTRAVENOUS | Status: DC | PRN
  Administered 2016-03-23: 100 ug/kg/min via INTRAVENOUS

## 2016-03-23 MED ORDER — LACTATED RINGERS IV SOLN
INTRAVENOUS | Status: DC
  Administered 2016-03-23: 1000 mL via INTRAVENOUS

## 2016-03-23 MED ORDER — PROPOFOL 10 MG/ML IV BOLUS
INTRAVENOUS | Status: AC
  Filled 2016-03-23: qty 40

## 2016-03-23 MED ORDER — SODIUM CHLORIDE 0.9 % IV SOLN: INTRAVENOUS | Status: DC

## 2016-03-23 MED ORDER — LIDOCAINE HCL (CARDIAC) 20 MG/ML IV SOLN
INTRAVENOUS | Status: AC
  Filled 2016-03-23: qty 5

## 2016-03-23 MED ORDER — PROPOFOL 10 MG/ML IV BOLUS
INTRAVENOUS | Status: DC | PRN
  Administered 2016-03-23: 20 mg via INTRAVENOUS
  Administered 2016-03-23: 40 mg via INTRAVENOUS

## 2016-03-23 MED ORDER — ONDANSETRON HCL 4 MG/2ML IJ SOLN
INTRAMUSCULAR | Status: AC
  Filled 2016-03-23: qty 2

## 2016-03-23 MED ORDER — ONDANSETRON HCL 4 MG/2ML IJ SOLN
INTRAMUSCULAR | Status: DC | PRN
  Administered 2016-03-23: 4 mg via INTRAVENOUS

## 2016-03-23 MED ORDER — LIDOCAINE HCL (CARDIAC) 20 MG/ML IV SOLN
INTRAVENOUS | Status: DC | PRN
  Administered 2016-03-23: 60 mg via INTRAVENOUS

## 2016-03-23 SURGICAL SUPPLY — 22 items

## 2016-03-23 NOTE — Transfer of Care (Signed)
Immediate Anesthesia Transfer of Care Note  Patient: Christina Sellers  Procedure(s) Performed: Procedure(s): COLONOSCOPY WITH PROPOFOL (N/A)  Patient Location: PACU  Anesthesia Type:MAC  Level of Consciousness:  sedated, patient cooperative and responds to stimulation  Airway & Oxygen Therapy:Patient Spontanous Breathing and Patient connected to face mask oxgen  Post-op Assessment:  Report given to PACU RN and Post -op Vital signs reviewed and stable  Post vital signs:  Reviewed and stable  Last Vitals:  Filed Vitals:   03/23/16 1053  BP: 145/53  Pulse: 83  Temp: 36.7 C  Resp: 11    Complications: No apparent anesthesia complications

## 2016-03-23 NOTE — Op Note (Signed)
[facility] Patient Name: Christina Sellers Procedure Date: 03/23/2016 MRN: [id number] Attending MD: Garlan Fair , MD Date of Birth: January 03, 1941 CSN:  Age: 75 Admit Type: Outpatient Procedure:                Colonoscopy Indications:              Screening for colorectal malignant neoplasm Providers:                Garlan Fair, MD, Carolynn Comment, RN, Corliss Parish, Technician Referring MD:              Medicines:                Propofol per Anesthesia Complications:            No immediate complications. Estimated Blood Loss:     Estimated blood loss: none. Procedure:                Pre-Anesthesia Assessment:                           - Prior to the procedure, a History and Physical                            was performed, and patient medications and                            allergies were reviewed. The patient's tolerance of                            previous anesthesia was also reviewed. The risks                            and benefits of the procedure and the sedation                            options and risks were discussed with the patient.                            All questions were answered, and informed consent                            was obtained. Prior Anticoagulants: The patient has                            taken no previous anticoagulant or antiplatelet                            agents. ASA Grade Assessment: III - A patient with                            severe systemic disease. After reviewing the risks  and benefits, the patient was deemed in                            satisfactory condition to undergo the procedure.                           After obtaining informed consent, the colonoscope                            was passed under direct vision. Throughout the                            procedure, the patient's blood pressure, pulse, and   oxygen saturations were monitored continuously. The                            Colonoscope was introduced through the anus and                            advanced to the the cecum, identified by                            appendiceal orifice and ileocecal valve. The                            colonoscopy was technically difficult and complex                            due to significant looping. The patient tolerated                            the procedure well. The quality of the bowel                            preparation was good. The terminal ileum, the                            ileocecal valve, the appendiceal orifice and the                            rectum were photographed. Scope In: 11:30:04 AM Scope Out: 11:56:15 AM Scope Withdrawal Time: 0 hours 9 minutes 37 seconds  Total Procedure Duration: 0 hours 26 minutes 11 seconds  Findings:      The perianal and digital rectal examinations were normal.      The entire examined colon appeared normal.      Multiple small and large-mouthed diverticula were found in the sigmoid       colon and descending colon. Impression:               - The entire examined colon is normal.                           - Diverticulosis in the sigmoid colon and in the  descending colon.                           - No specimens collected. Moderate Sedation:      N/A- Per Anesthesia Care Recommendation:           - Patient has a contact number available for                            emergencies. The signs and symptoms of potential                            delayed complications were discussed with the                            patient. Return to normal activities tomorrow.                            Written discharge instructions were provided to the                            patient.                           - Repeat colonoscopy is not recommended for                            screening purposes.                            - Resume previous diet.                           - Continue present medications. Procedure Code(s):        --- Professional ---                           [phone], Colonoscopy, flexible; diagnostic, including                            collection of specimen(s) by brushing or washing,                            when performed (separate procedure) Diagnosis Code(s):        --- Professional ---                           Z12.11, Encounter for screening for malignant                            neoplasm of colon                           K57.30, Diverticulosis of large intestine without                            perforation or abscess without bleeding CPT copyright 2016 [employer]. All  rights reserved. The codes documented in this report are preliminary and upon coder review may  be revised to meet current compliance requirements. Earle Gell, MD Garlan Fair, MD 03/23/2016 12:02:48 PM This report has been signed electronically. Number of Addenda: 0

## 2016-03-23 NOTE — Anesthesia Procedure Notes (Signed)
Procedure Name: MAC Date/Time: 03/23/2016 11:24 AM Performed by: West Pugh Pre-anesthesia Checklist: Patient identified, Timeout performed, Emergency Drugs available, Suction available and Patient being monitored Patient Re-evaluated:Patient Re-evaluated prior to inductionOxygen Delivery Method: Simple face mask Dental Injury: Teeth and Oropharynx as per pre-operative assessment

## 2016-03-23 NOTE — Anesthesia Postprocedure Evaluation (Signed)
Anesthesia Post Note  Patient: Christina Sellers  Procedure(s) Performed: Procedure(s) (LRB): COLONOSCOPY WITH PROPOFOL (N/A)  Patient location during evaluation: PACU Anesthesia Type: MAC Level of consciousness: awake and alert Pain management: pain level controlled Vital Signs Assessment: post-procedure vital signs reviewed and stable Respiratory status: spontaneous breathing, nonlabored ventilation, respiratory function stable and patient connected to nasal cannula oxygen Cardiovascular status: stable and blood pressure returned to baseline Anesthetic complications: no    Last Vitals:  Filed Vitals:   03/23/16 1225 03/23/16 1230  BP:  140/47  Pulse: 70 72  Temp:    Resp: 18 15    Last Pain: There were no vitals filed for this visit.               Marlowe Lawes J

## 2016-03-23 NOTE — Anesthesia Preprocedure Evaluation (Addendum)
Anesthesia Evaluation  Patient identified by MRN, date of birth, ID band Patient awake    Reviewed: Allergy & Precautions, NPO status , Patient's Chart, lab work & pertinent test results  Airway Mallampati: II  TM Distance: >3 FB Neck ROM: Full    Dental no notable dental hx.    Pulmonary asthma , COPD, former smoker,    Pulmonary exam normal breath sounds clear to auscultation       Cardiovascular hypertension, + Peripheral Vascular Disease  Normal cardiovascular exam Rhythm:Regular Rate:Normal     Neuro/Psych PSYCHIATRIC DISORDERS Anxiety Depression negative neurological ROS     GI/Hepatic Neg liver ROS, GERD  ,  Endo/Other  Hypothyroidism   Renal/GU negative Renal ROS  negative genitourinary   Musculoskeletal  (+) Arthritis ,   Abdominal (+) + obese,   Peds negative pediatric ROS (+)  Hematology negative hematology ROS (+)   Anesthesia Other Findings   Reproductive/Obstetrics negative OB ROS                             Anesthesia Physical Anesthesia Plan  ASA: III  Anesthesia Plan: MAC   Post-op Pain Management:    Induction: Intravenous  Airway Management Planned: Natural Airway  Additional Equipment:   Intra-op Plan:   Post-operative Plan:   Informed Consent: I have reviewed the patients History and Physical, chart, labs and discussed the procedure including the risks, benefits and alternatives for the proposed anesthesia with the patient or authorized representative who has indicated his/her understanding and acceptance.   Dental advisory given  Plan Discussed with: CRNA  Anesthesia Plan Comments:         Anesthesia Quick Evaluation

## 2016-03-23 NOTE — H&P (Signed)
  Procedure: Screening colonoscopy. Normal screening colonoscopy performed in November 2007  History: The patient is a 75 year old female born 17-May-1941. She underwent a normal screening colonoscopy in November 2007. Her episodes of passing blood tinged mucus with her bowel movements have resolved. Her episodes of feeling hot associated with sweating without chills have diminished.  She is scheduled to undergo a repeat screening colonoscopy today.  Past history: Anxiety with depression. Gastroesophageal reflux. Hypertension. Osteoarthritis. Hypothyroidism. Irritable bowel syndrome. Allergic rhinitis. Peripheral vascular disease. Carotid artery disease. Chronic obstructive pulmonary disease. Avascular necrosis of the distal left tibia. Recurrent urinary tract infections. Sjogren's syndrome. Cholecystectomy. Right knee surgery. Bilateral cataract surgery. Left bunion surgery.  Medication allergies: Levaquin caused hives. Trimethoprim caused hives.  Exam: The patient is alert and lying comfortably on the endoscopy stretcher. Abdomen is soft and nontender to palpation. Lungs are clear to auscultation. Cardiac exam reveals a regular rhythm.  Plan: Proceed with screening colonoscopy

## 2016-03-23 NOTE — Discharge Instructions (Signed)

## 2016-03-24 ENCOUNTER — Encounter (HOSPITAL_COMMUNITY): Payer: Self-pay | Admitting: Gastroenterology

## 2016-03-31 DIAGNOSIS — E039 Hypothyroidism, unspecified: Secondary | ICD-10-CM | POA: Diagnosis not present

## 2016-03-31 DIAGNOSIS — R61 Generalized hyperhidrosis: Secondary | ICD-10-CM | POA: Diagnosis not present

## 2016-03-31 DIAGNOSIS — K921 Melena: Secondary | ICD-10-CM | POA: Diagnosis not present

## 2016-04-08 DIAGNOSIS — H903 Sensorineural hearing loss, bilateral: Secondary | ICD-10-CM | POA: Diagnosis not present

## 2016-04-23 DIAGNOSIS — D692 Other nonthrombocytopenic purpura: Secondary | ICD-10-CM | POA: Diagnosis not present

## 2016-04-23 DIAGNOSIS — L821 Other seborrheic keratosis: Secondary | ICD-10-CM | POA: Diagnosis not present

## 2016-04-23 DIAGNOSIS — L820 Inflamed seborrheic keratosis: Secondary | ICD-10-CM | POA: Diagnosis not present

## 2016-07-07 DIAGNOSIS — L821 Other seborrheic keratosis: Secondary | ICD-10-CM | POA: Diagnosis not present

## 2016-07-07 DIAGNOSIS — L814 Other melanin hyperpigmentation: Secondary | ICD-10-CM | POA: Diagnosis not present

## 2016-07-07 DIAGNOSIS — L820 Inflamed seborrheic keratosis: Secondary | ICD-10-CM | POA: Diagnosis not present

## 2016-07-07 DIAGNOSIS — L918 Other hypertrophic disorders of the skin: Secondary | ICD-10-CM | POA: Diagnosis not present

## 2016-07-07 DIAGNOSIS — D692 Other nonthrombocytopenic purpura: Secondary | ICD-10-CM | POA: Diagnosis not present

## 2016-07-07 DIAGNOSIS — D1801 Hemangioma of skin and subcutaneous tissue: Secondary | ICD-10-CM | POA: Diagnosis not present

## 2016-07-20 DIAGNOSIS — M1712 Unilateral primary osteoarthritis, left knee: Secondary | ICD-10-CM | POA: Diagnosis not present

## 2016-07-27 DIAGNOSIS — R51 Headache: Secondary | ICD-10-CM | POA: Diagnosis not present

## 2016-08-21 DIAGNOSIS — R300 Dysuria: Secondary | ICD-10-CM | POA: Diagnosis not present

## 2016-09-01 DIAGNOSIS — L308 Other specified dermatitis: Secondary | ICD-10-CM | POA: Diagnosis not present

## 2016-09-06 DIAGNOSIS — H903 Sensorineural hearing loss, bilateral: Secondary | ICD-10-CM | POA: Diagnosis not present

## 2016-09-14 ENCOUNTER — Encounter: Payer: Self-pay | Admitting: Women's Health

## 2016-09-14 ENCOUNTER — Ambulatory Visit (INDEPENDENT_AMBULATORY_CARE_PROVIDER_SITE_OTHER): Payer: Medicare Other | Admitting: Women's Health

## 2016-09-14 VITALS — BP 130/78 | Ht 59.0 in | Wt 166.0 lb

## 2016-09-14 DIAGNOSIS — N898 Other specified noninflammatory disorders of vagina: Secondary | ICD-10-CM | POA: Diagnosis not present

## 2016-09-14 DIAGNOSIS — R350 Frequency of micturition: Secondary | ICD-10-CM

## 2016-09-14 DIAGNOSIS — B372 Candidiasis of skin and nail: Secondary | ICD-10-CM | POA: Diagnosis not present

## 2016-09-14 LAB — WET PREP FOR TRICH, YEAST, CLUE
Clue Cells Wet Prep HPF POC: NONE SEEN
Trich, Wet Prep: NONE SEEN
YEAST WET PREP: NONE SEEN

## 2016-09-14 LAB — URINALYSIS W MICROSCOPIC + REFLEX CULTURE
BILIRUBIN URINE: NEGATIVE
Casts: NONE SEEN [LPF]
Crystals: NONE SEEN [HPF]
GLUCOSE, UA: NEGATIVE
HGB URINE DIPSTICK: NEGATIVE
KETONES UR: NEGATIVE
LEUKOCYTES UA: NEGATIVE
Nitrite: NEGATIVE
PH: 6 (ref 5.0–8.0)
RBC / HPF: NONE SEEN RBC/HPF (ref <=2)
Specific Gravity, Urine: 1.02 (ref 1.001–1.035)
YEAST: NONE SEEN [HPF]

## 2016-09-14 MED ORDER — NYSTATIN-TRIAMCINOLONE 100000-0.1 UNIT/GM-% EX OINT: 1.0000 "application " | TOPICAL_OINTMENT | Freq: Two times a day (BID) | CUTANEOUS | 0 refills | Status: DC

## 2016-09-14 NOTE — Patient Instructions (Signed)
Cutaneous Candidiasis °Cutaneous candidiasis is a condition in which there is an overgrowth of yeast (candida) on the skin. Yeast normally live on the skin, but in small enough numbers not to cause any symptoms. In certain cases, increased growth of the yeast may cause an actual yeast infection. This kind of infection usually occurs in areas of the skin that are constantly warm and moist, such as the armpits or the groin. Yeast is the most common cause of diaper rash in babies and in people who cannot control their bowel movements (incontinence). °CAUSES  °The fungus that most often causes cutaneous candidiasis is Candida albicans. Conditions that can increase the risk of getting a yeast infection of the skin include: °· Obesity. °· Pregnancy. °· Diabetes. °· Taking antibiotic medicine. °· Taking birth control pills. °· Taking steroid medicines. °· Thyroid disease. °· An iron or zinc deficiency. °· Problems with the immune system. °SYMPTOMS  °· Red, swollen area of the skin. °· Bumps on the skin. °· Itchiness. °DIAGNOSIS  °The diagnosis of cutaneous candidiasis is usually based on its appearance. Light scrapings of the skin may also be taken and viewed under a microscope to identify the presence of yeast. °TREATMENT  °Antifungal creams may be applied to the infected skin. In severe cases, oral medicines may be needed.  °HOME CARE INSTRUCTIONS  °· Keep your skin clean and dry. °· Maintain a healthy weight. °· If you have diabetes, keep your blood sugar under control. °SEEK IMMEDIATE MEDICAL CARE IF: °· Your rash continues to spread despite treatment. °· You have a fever, chills, or abdominal pain. °  °This information is not intended to replace advice given to you by your health care provider. Make sure you discuss any questions you have with your health care provider. °  °Document Released: 08/10/2011 Document Revised: 02/14/2012 Document Reviewed: 05/26/2015 °Elsevier Interactive Patient Education ©2016 [employer] ° °

## 2016-09-14 NOTE — Progress Notes (Signed)
Presents with complaint of burning sensation with redness on skin in the groin area as well as perineum for the past week Was treated for UTI approximately one month ago though symptoms resolve. Denies vaginal discharge, abdominal pain or fever. Vaginal burning with urination without pain at end of stream. Not sexually active. Postmenopausal with no bleeding or HRT. Hypothyroid, hypertension, no diabetes. Occasional bleeding, denies incontinence.  Exam: Appears well. UA: Trace protein, 0-5 WBCs, few bacteria no RBCs. External genitalia extremely erythematous on labia extending to rectum, both groins especially left groin erythematous. Wet prep done with a Q-tip negative. Labial fusion no change.  Probable yeast/skin  Plan: Mycolog ointment twice daily to groins and perineum, open to air, loose clothing. Instructed to call if no relief of symptoms. Urine culture pending.

## 2016-09-14 NOTE — Addendum Note (Signed)
Addended by: Burnett Kanaris on: 09/14/2016 03:28 PM   Modules accepted: Orders

## 2016-09-15 LAB — URINE CULTURE

## 2016-09-17 DIAGNOSIS — Z23 Encounter for immunization: Secondary | ICD-10-CM | POA: Diagnosis not present

## 2016-10-25 DIAGNOSIS — R799 Abnormal finding of blood chemistry, unspecified: Secondary | ICD-10-CM | POA: Diagnosis not present

## 2016-10-25 DIAGNOSIS — E559 Vitamin D deficiency, unspecified: Secondary | ICD-10-CM | POA: Diagnosis not present

## 2016-10-25 DIAGNOSIS — K219 Gastro-esophageal reflux disease without esophagitis: Secondary | ICD-10-CM | POA: Diagnosis not present

## 2016-10-25 DIAGNOSIS — H04129 Dry eye syndrome of unspecified lacrimal gland: Secondary | ICD-10-CM | POA: Diagnosis not present

## 2016-10-25 DIAGNOSIS — N183 Chronic kidney disease, stage 3 (moderate): Secondary | ICD-10-CM | POA: Diagnosis not present

## 2016-10-25 DIAGNOSIS — E669 Obesity, unspecified: Secondary | ICD-10-CM | POA: Diagnosis not present

## 2016-10-25 DIAGNOSIS — Z6833 Body mass index (BMI) 33.0-33.9, adult: Secondary | ICD-10-CM | POA: Diagnosis not present

## 2016-10-25 DIAGNOSIS — H9313 Tinnitus, bilateral: Secondary | ICD-10-CM | POA: Diagnosis not present

## 2016-10-25 DIAGNOSIS — F329 Major depressive disorder, single episode, unspecified: Secondary | ICD-10-CM | POA: Diagnosis not present

## 2016-10-25 DIAGNOSIS — E039 Hypothyroidism, unspecified: Secondary | ICD-10-CM | POA: Diagnosis not present

## 2016-10-25 DIAGNOSIS — Z1389 Encounter for screening for other disorder: Secondary | ICD-10-CM | POA: Diagnosis not present

## 2016-10-25 DIAGNOSIS — E7800 Pure hypercholesterolemia, unspecified: Secondary | ICD-10-CM | POA: Diagnosis not present

## 2016-10-25 DIAGNOSIS — Z0000 Encounter for general adult medical examination without abnormal findings: Secondary | ICD-10-CM | POA: Diagnosis not present

## 2016-10-25 DIAGNOSIS — M109 Gout, unspecified: Secondary | ICD-10-CM | POA: Diagnosis not present

## 2016-11-15 DIAGNOSIS — H5213 Myopia, bilateral: Secondary | ICD-10-CM | POA: Diagnosis not present

## 2016-11-15 DIAGNOSIS — H524 Presbyopia: Secondary | ICD-10-CM | POA: Diagnosis not present

## 2016-11-15 DIAGNOSIS — H02832 Dermatochalasis of right lower eyelid: Secondary | ICD-10-CM | POA: Diagnosis not present

## 2016-11-15 DIAGNOSIS — H02831 Dermatochalasis of right upper eyelid: Secondary | ICD-10-CM | POA: Diagnosis not present

## 2016-11-15 DIAGNOSIS — H26493 Other secondary cataract, bilateral: Secondary | ICD-10-CM | POA: Diagnosis not present

## 2016-11-15 DIAGNOSIS — H04123 Dry eye syndrome of bilateral lacrimal glands: Secondary | ICD-10-CM | POA: Diagnosis not present

## 2016-11-15 DIAGNOSIS — H52223 Regular astigmatism, bilateral: Secondary | ICD-10-CM | POA: Diagnosis not present

## 2016-11-18 DIAGNOSIS — M1712 Unilateral primary osteoarthritis, left knee: Secondary | ICD-10-CM | POA: Diagnosis not present

## 2016-12-01 ENCOUNTER — Ambulatory Visit (INDEPENDENT_AMBULATORY_CARE_PROVIDER_SITE_OTHER): Payer: Medicare Other | Admitting: Women's Health

## 2016-12-01 ENCOUNTER — Encounter: Payer: Self-pay | Admitting: Women's Health

## 2016-12-01 VITALS — BP 122/74 | Ht 59.0 in | Wt 162.6 lb

## 2016-12-01 DIAGNOSIS — K5900 Constipation, unspecified: Secondary | ICD-10-CM

## 2016-12-01 DIAGNOSIS — Z01419 Encounter for gynecological examination (general) (routine) without abnormal findings: Secondary | ICD-10-CM

## 2016-12-01 DIAGNOSIS — E039 Hypothyroidism, unspecified: Secondary | ICD-10-CM

## 2016-12-01 DIAGNOSIS — M8588 Other specified disorders of bone density and structure, other site: Secondary | ICD-10-CM

## 2016-12-01 DIAGNOSIS — Q525 Fusion of labia: Secondary | ICD-10-CM | POA: Diagnosis not present

## 2016-12-01 DIAGNOSIS — K669 Disorder of peritoneum, unspecified: Secondary | ICD-10-CM | POA: Diagnosis not present

## 2016-12-01 NOTE — Patient Instructions (Signed)

## 2016-12-01 NOTE — Progress Notes (Signed)
Annu Villers 15-Sep-1941 [id number]    History:    Presents for breast and pelvic exam.  Postmenopausal, no HRT, no bleeding.  Normal Pap and mammogram history. Labial fusion, normally uses vaginal estrogen cream but has not used since October. Denies difficulty with urination or urinary symptoms.  Reports increased constipation. 03/2016 colonoscopy, diverticulosis otherwise normal.  2017 DEXA Tscore -1.8 frax 18% / 3.8%, declines medication. Trying to increase regular exercise and activity has continued problems with left foot pain, low back pain and hip pain . History of hypertension, no longer on medication.  Past medical history, past surgical history, family history and social history were all reviewed and documented in the EPIC chart. 3 children, son lives in town.  Enjoys travel and chair yoga.   ROS:  A ROS was performed and pertinent positives and negatives are included.  Exam:  Vitals:   12/01/16 1117  BP: 122/74  Weight: 162 lb 9.6 oz (73.8 kg)  Height: 4\' 11"  (1.499 m)   Body mass index is 32.84 kg/m.   General appearance:  Normal Thyroid:  Symmetrical, normal in size, without palpable masses or nodularity. Respiratory  Auscultation:  Clear without wheezing or rhonchi Cardiovascular  Auscultation:  Regular rate, without rubs, murmurs or gallops  Edema/varicosities:  Not grossly evident Abdominal  Soft,nontender, without masses, guarding or rebound.  Liver/spleen:  No organomegaly noted  Hernia:  None appreciated  Skin  Inspection:  Grossly normal   Breasts: Examined lying and sitting.     Right: Without masses, retractions, discharge or axillary adenopathy.     Left: Without masses, retractions, discharge or axillary adenopathy. Gentitourinary   Inguinal/mons:  Normal without inguinal adenopathy  External genitalia: Excoriated skin from urinary incontinence  BUS/Urethra/Skene's glands:  Normal  Vagina:  Atrophy, labial fusion  Cervix:  Normal  Uterus:   Normal in size, shape and contour.  Midline and mobile  Adnexa/parametria:     Rt: Without masses or tenderness.   Lt: Without masses or tenderness.  Anus and perineum: Normal  Digital rectal exam: Normal sphincter tone without palpated masses or tenderness  Assessment/Plan:  75 y.o. WWFG3 P3 for rest and pelvic exam    Postmenopausal, no HRT, no bleeding Labial fusion, asymptomatic Hypothyroid, primary care manages labs and meds Constipation  Plan: Estradiol Vaginal 0.02% cream apply externally 3 times weekly, prescription, proper use given and reviewed.. Continue A&D for external peritoneal irritation. Miralax as needed for constipation, increase fiber rich foods and diet.  Continue SBE's and yearly mammogram.  Calcium rich diet, regular weight bearing exercise, continue yoga and exercise classes, daily vitamin D 1000 IU encouraged.  Fall precautions/ home safety reviewed.      Huel Cote [facility], 12:09 PM 12/01/2016

## 2017-01-13 ENCOUNTER — Encounter: Payer: Self-pay | Admitting: Women's Health

## 2017-01-13 DIAGNOSIS — H8309 Labyrinthitis, unspecified ear: Secondary | ICD-10-CM | POA: Insufficient documentation

## 2017-01-20 ENCOUNTER — Encounter: Payer: Self-pay | Admitting: Physical Therapy

## 2017-01-20 ENCOUNTER — Ambulatory Visit: Payer: Medicare Other | Attending: Otolaryngology | Admitting: Physical Therapy

## 2017-01-20 DIAGNOSIS — R42 Dizziness and giddiness: Secondary | ICD-10-CM | POA: Insufficient documentation

## 2017-01-20 DIAGNOSIS — R2681 Unsteadiness on feet: Secondary | ICD-10-CM | POA: Diagnosis present

## 2017-01-20 NOTE — Therapy (Signed)
[facility] [address] [place], [place], [address] Phone: [phone]   Fax:  [phone]  Physical Therapy Evaluation  Patient Details  Name: Christina Sellers MRN: [id number] Date of Birth: June 06, 1941 Referring Provider: Dr. Melida Quitter  Encounter Date: 01/20/2017      PT End of Session - 01/20/17 1624    Visit Number 1   Number of Visits 12   Date for PT Re-Evaluation 03/03/17   Authorization Type UHC Medicare   PT Start Time [id number]   PT Stop Time 1615   PT Time Calculation (min) 45 min   Equipment Utilized During Treatment Gait belt   Activity Tolerance Patient tolerated treatment well   Behavior During Therapy [facility] for tasks assessed/performed      Past Medical History:  Diagnosis Date  . Abnormal Pap smear of vagina   . Allergic sinusitis   . Anxiety   . Atherosclerotic peripheral vascular disease ([place])   . Carotid artery stenosis   . Cellulitis   . COPD with asthma ([place])    not an issue now  . Depression   . DJD (degenerative joint disease)   . Elevated cholesterol   . Elevated MCV    secondary to alchol  . GERD (gastroesophageal reflux disease)    not an issue now.  . Gout   . Hearing difficulty    bilateral hearing aids  . History of recurrent UTIs   . Hypertension    loss weight'off meds now"  . Hypothyroidism   . IBS (irritable bowel syndrome)   . Insomnia   . Labial fusion   . Osteoarthritis of left knee   . Osteopenia   . Recurrent vaginitis   . Sjogren's syndrome ([place])   . Syncope 11.7.14   secondary to dehydration and possible hypoglycemia  . Tendonitis   . Thyroid disease   . Transfusion history    age 13 "anemia"    Past Surgical History:  Procedure Laterality Date  . CATARACT EXTRACTION, BILATERAL Bilateral   . CHOLECYSTECTOMY     open  . COLONOSCOPY WITH PROPOFOL N/A 03/23/2016   Procedure: COLONOSCOPY WITH PROPOFOL;  Surgeon: Garlan Fair, MD;  Location: [facility]  ENDOSCOPY;  Service: Endoscopy;  Laterality: N/A;  . EYE SURGERY    . FOOT SURGERY Left    retained hardware  . KNEE SURGERY Left    scope   . TONSILLECTOMY      There were no vitals filed for this visit.       Subjective Assessment - 01/20/17 1536    Subjective Pt is a 76 y/o female who presents to OPPT with sudden onset of vertigo.  Pt reports symptoms began 12/01/16 with constant spinning, vomiting x several days.  Pt went to PCP with neg ear infection and rx meclizine.  Pt reports meclizine not beneficial but symptoms improved after one week.  Pt presents today with continued difficulty with quick turns, going backwards.  Pt had epley's performed without improvement.  Returned to PCP again with suspected inner ear infection and referred to ENT.  ENT then referred to vestibular rehab.   Pertinent History anxiety, PVD, COPD, depression, DJD, HTN   Limitations Walking   Patient Stated Goals improve dizziness, improve balance   Currently in Pain? No/denies            [facility] PT Assessment - 01/20/17 1541      Assessment   Medical Diagnosis labrynthitis   Referring Provider Dr. Melida Quitter  Onset Date/Surgical Date 12/01/16   Next MD Visit PRN   Prior Therapy none     Precautions   Precautions None     Restrictions   Weight Bearing Restrictions No     Balance Screen   Has the patient fallen in the past 6 months No   Has the patient had a decrease in activity level because of a fear of falling?  Yes   Is the patient reluctant to leave their home because of a fear of falling?  No     Home Environment   Living Environment Private residence   Living Arrangements Alone  small dog   Type of [place] to enter   Entrance Stairs-Number of Steps 2   [place] One level   Additional Comments has handicap accessible bathroom     Prior Function   Level of [place] Retired   Leisure travel,  yoga, walking, reading     Cognition   Overall Cognitive Status Within Functional Limits for tasks assessed     Observation/Other Assessments   Focus on Therapeutic Outcomes (FOTO)  63 (37% limited; predicted 21% limited)   Dizziness Handicap Inventory (DHI)  46 (moderate)     Ambulation/Gait   Ambulation/Gait Yes   Ambulation/Gait Assistance 5: Supervision   Ambulation Distance (Feet) 300 Feet   Gait Pattern Wide base of support   Gait velocity 2.01 ft/sec  16.16 sec     Functional Gait  Assessment   Gait assessed  Yes   Gait Level Surface Walks 20 ft, slow speed, abnormal gait pattern, evidence for imbalance or deviates 10-15 in outside of the 12 in walkway width. Requires more than 7 sec to ambulate 20 ft.   Change in Gait Speed Makes only minor adjustments to walking speed, or accomplishes a change in speed with significant gait deviations, deviates 10-15 in outside the 12 in walkway width, or changes speed but loses balance but is able to recover and continue walking.   Gait with Horizontal Head Turns Performs head turns with moderate changes in gait velocity, slows down, deviates 10-15 in outside 12 in walkway width but recovers, can continue to walk.   Gait with Vertical Head Turns Performs task with moderate change in gait velocity, slows down, deviates 10-15 in outside 12 in walkway width but recovers, can continue to walk.   Gait and Pivot Turn Pivot turns safely in greater than 3 sec and stops with no loss of balance, or pivot turns safely within 3 sec and stops with mild imbalance, requires small steps to catch balance.   Step Over Obstacle Is able to step over one shoe box (4.5 in total height) but must slow down and adjust steps to clear box safely. May require verbal cueing.   Gait with Narrow Base of Support Ambulates less than 4 steps heel to toe or cannot perform without assistance.   Gait with Eyes Closed Cannot walk 20 ft without assistance, severe gait deviations or  imbalance, deviates greater than 15 in outside 12 in walkway width or will not attempt task.   Ambulating Backwards Walks 20 ft, slow speed, abnormal gait pattern, evidence for imbalance, deviates 10-15 in outside 12 in walkway width.   Steps Alternating feet, must use rail.   Total Score 10            Vestibular Assessment - 01/20/17 1545      Vestibular Assessment  General Observation denies symptoms at rest     Symptom Behavior   Type of Dizziness Imbalance   Frequency of Dizziness quick turns, sitting up in AM   Duration of Dizziness seconds to minute   Aggravating Factors Turning head quickly;Turning body quickly;Supine to sit;Mornings   Relieving Factors Rest;Closing eyes;Head stationary;Slow movements     Occulomotor Exam   Occulomotor Alignment Normal   Spontaneous Absent   Gaze-induced Absent   Smooth Pursuits Intact   Saccades Intact  mild increase in symptoms     Vestibulo-Occular Reflex   VOR 1 Head Only (x 1 viewing) WNL with mild difficulty maintaining gaze and increase in symptoms   Comment Head Impulse positive to L; mild loss of gaze to R     Positional Testing   Dix-Hallpike --   Sidelying Test Sidelying Right;Sidelying Left   Horizontal Canal Testing Horizontal Canal Right;Horizontal Canal Left     Sidelying Right   Sidelying Right Duration none   Sidelying Right Symptoms No nystagmus     Sidelying Left   Sidelying Left Duration none   Sidelying Left Symptoms No nystagmus     Horizontal Canal Right   Horizontal Canal Right Duration none   Horizontal Canal Right Symptoms Normal     Horizontal Canal Left   Horizontal Canal Left Duration none   Horizontal Canal Left Symptoms Normal                       PT Education - 01/20/17 1623    Education provided Yes   Education Details vestibular labrynthitis   Person(s) Educated Patient   Methods Explanation;Handout   Comprehension Verbalized understanding          PT Short  Term Goals - 01/20/17 1629      PT SHORT TERM GOAL #1   Title STGs=LTGs           PT Long Term Goals - 01/20/17 1627      PT LONG TERM GOAL #1   Title independent with HEP (03/03/17)   Time 6   Period Weeks   Status New     PT LONG TERM GOAL #2   Title improve DHI to < 30% for improved function (03/03/17)   Time 6   Period Weeks   Status New     PT LONG TERM GOAL #3   Title improve FGA to >/= 18/30 for improved functional mobility and balance (03/03/17)   Time 6   Period Weeks   Status New     PT LONG TERM GOAL #4   Title improve gait velocity to > 2.62 ft/sec for improved community access (03/03/17)   Time 6   Period Weeks   Status New     PT LONG TERM GOAL #5   Title amb > 1000' on various indoor/outdoor surfaces independently for improved functional mobility (03/03/17)   Time 6   Period Weeks   Status New               Plan - 01/20/17 1624    Clinical Impression Statement Pt is a 76 y/o female who presents to OPPT for moderate complexity PT eval for vertigo.  Symptoms most consistent with L vestibular neuritis/labrynthitis.  Pt high fall risk as indicated by FGA and will benefit from PT to address these deficits.   Rehab Potential Good   PT Frequency 2x / week   PT Duration 6 weeks   PT Treatment/Interventions ADLs/Self Care Home Management;Canalith  Repostioning;Neuromuscular re-education;Balance training;Therapeutic exercise;Therapeutic activities;Functional mobility training;Stair training;Gait training;Patient/family education;Vestibular   PT Next Visit Plan give gaze x 1, corner balance and dynamic gait   Consulted and Agree with Plan of Care Patient      Patient will benefit from skilled therapeutic intervention in order to improve the following deficits and impairments:  Abnormal gait, Decreased balance, Decreased mobility, Difficulty walking, Dizziness  Visit Diagnosis: Dizziness and giddiness - Plan: PT plan of care cert/re-cert  Unsteadiness  on feet - Plan: PT plan of care cert/re-cert      G-Codes - [id number]    Functional Assessment Tool Used DHI 46, FGA 10/30   Functional Limitation Mobility: Walking and moving around   Mobility: Walking and Moving Around Current Status [phone]) At least 40 percent but less than 60 percent impaired, limited or restricted   Mobility: Walking and Moving Around Goal Status [phone]) At least 1 percent but less than 20 percent impaired, limited or restricted       Problem List Patient Active Problem List   Diagnosis Date Noted  . Atherosclerotic peripheral vascular disease ([place])   . Essential hypertension, benign 11/22/2013  . Osteopenia 11/22/2013  . Labial fusion       Laureen Abrahams, PT, DPT 01/20/17 4:32 PM     [facility] [address] [place], [place], [address] Phone: [phone]   Fax:  [phone]  Name: Christina Sellers MRN: [id number] Date of Birth: 1941/01/23

## 2017-01-24 ENCOUNTER — Ambulatory Visit: Payer: Medicare Other | Admitting: Physical Therapy

## 2017-01-24 DIAGNOSIS — R2681 Unsteadiness on feet: Secondary | ICD-10-CM

## 2017-01-24 DIAGNOSIS — R42 Dizziness and giddiness: Secondary | ICD-10-CM | POA: Diagnosis not present

## 2017-01-24 NOTE — Patient Instructions (Signed)
Gaze Stabilization: Sitting    Keeping eyes on target on wall 3-5 feet away, tilt head down 15-30 and move head side to side for __30__ seconds. Repeat while moving head up and down for __30__ seconds. Do __2-3__ sessions per day.  Copyright  VHI. All rights reserved.    Gaze Stabilization: Tip Card  1.Target must remain in focus, not blurry, and appear stationary while head is in motion. 2.Perform exercises with small head movements (45 to either side of midline). 3.Increase speed of head motion so long as target is in focus. 4.If you wear eyeglasses, be sure you can see target through lens (therapist will give specific instructions for bifocal / progressive lenses). 5.These exercises may provoke dizziness or nausea. Work through these symptoms. If too dizzy, slow head movement slightly. Rest between each exercise. 6.Exercises demand concentration; avoid distractions.  Copyright  VHI. All rights reserved.    Feet Together (Compliant Surface) Varied Arm Positions - Eyes Closed    Stand on compliant surface: __pillow or cushion___ with feet together and arms at your side. Close eyes and stand still. Hold__10-15__ seconds. Repeat __5__ times per session. Do __2-3__ sessions per day.  Copyright  VHI. All rights reserved.    Feet Together (Compliant Surface) Head Motion - Eyes Open    With eyes open, standing on compliant surface: __pillow or cushion______, feet together, move head slowly: up and down 10 times and side to side 10 times. Repeat _1-2___ times per session. Do __2-3__ sessions per day.  Copyright  VHI. All rights reserved.    Feet Apart (Compliant Surface) Head Motion - Eyes Closed    Stand on compliant surface: _pillow or cushion____ with feet shoulder width apart. Close eyes and move head slowly, up and down 10 times and side to side 10 times. Repeat __1-2__ times per session. Do __2-3__ sessions per day.  Copyright  VHI. All rights reserved.

## 2017-01-24 NOTE — Therapy (Signed)
[facility] [address] [place], [place], [address] Phone: [phone]   Fax:  [phone]  Physical Therapy Treatment  Patient Details  Name: Christina Sellers MRN: [id number] Date of Birth: [birth year] Referring Provider: Dr. Melida Quitter  Encounter Date: 01/24/2017      PT End of Session - 01/24/17 0932    Visit Number 2   Number of Visits 12   Date for PT Re-Evaluation 03/03/17   Authorization Type UHC Medicare   PT Start Time 0849   PT Stop Time 0928   PT Time Calculation (min) 39 min   Equipment Utilized During Treatment Gait belt   Activity Tolerance Patient tolerated treatment well   Behavior During Therapy [facility] for tasks assessed/performed      Past Medical History:  Diagnosis Date  . Abnormal Pap smear of vagina   . Allergic sinusitis   . Anxiety   . Atherosclerotic peripheral vascular disease ([place])   . Carotid artery stenosis   . Cellulitis   . COPD with asthma ([place])    not an issue now  . Depression   . DJD (degenerative joint disease)   . Elevated cholesterol   . Elevated MCV    secondary to alchol  . GERD (gastroesophageal reflux disease)    not an issue now.  . Gout   . Hearing difficulty    bilateral hearing aids  . History of recurrent UTIs   . Hypertension    loss weight'off meds now"  . Hypothyroidism   . IBS (irritable bowel syndrome)   . Insomnia   . Labial fusion   . Osteoarthritis of left knee   . Osteopenia   . Recurrent vaginitis   . Sjogren's syndrome ([place])   . Syncope 11.7.14   secondary to dehydration and possible hypoglycemia  . Tendonitis   . Thyroid disease   . Transfusion history    age 4 "anemia"    Past Surgical History:  Procedure Laterality Date  . CATARACT EXTRACTION, BILATERAL Bilateral   . CHOLECYSTECTOMY     open  . COLONOSCOPY WITH PROPOFOL N/A 03/23/2016   Procedure: COLONOSCOPY WITH PROPOFOL;  Surgeon: Garlan Fair, MD;  Location: [facility]  ENDOSCOPY;  Service: Endoscopy;  Laterality: N/A;  . EYE SURGERY    . FOOT SURGERY Left    retained hardware  . KNEE SURGERY Left    scope   . TONSILLECTOMY      There were no vitals filed for this visit.      Subjective Assessment - 01/24/17 0852    Subjective doing well, nothing new to report.  having some mild arthritis pain.     Pertinent History anxiety, PVD, COPD, depression, DJD, HTN   Patient Stated Goals improve dizziness, improve balance   Currently in Pain? No/denies                          Vestibular Treatment/Exercise - 01/24/17 0929      Vestibular Treatment/Exercise   Vestibular Treatment Provided Gaze   Gaze Exercises X1 Viewing Horizontal;X1 Viewing Vertical     X1 Viewing Horizontal   Foot Position seated   Time --  30 sec   Reps 1   Comments mild increase in symptoms with cues for technique     X1 Viewing Vertical   Foot Position seated   Time --  30 sec   Reps 1   Comments mild increase in symptoms with cues for  technique            Balance Exercises - 01/24/17 0930      Balance Exercises: Standing   Standing Eyes Opened Foam/compliant surface;Narrow base of support (BOS);Head turns   Standing Eyes Closed Foam/compliant surface;Narrow base of support (BOS);5 reps;10 secs;Wide (BOA);Head turns   Gait with Head Turns Forward  horizontal/vertical/bil diagonals 40' x 2 each with min A           PT Education - 01/24/17 0932    Education provided Yes   Education Details HEP   Person(s) Educated Patient   Methods Explanation;Demonstration;Handout;Verbal cues   Comprehension Verbalized understanding;Returned demonstration;Verbal cues required;Need further instruction          PT Short Term Goals - 01/20/17 1629      PT SHORT TERM GOAL #1   Title STGs=LTGs           PT Long Term Goals - 01/20/17 1627      PT LONG TERM GOAL #1   Title independent with HEP (03/03/17)   Time 6   Period Weeks   Status New      PT LONG TERM GOAL #2   Title improve DHI to < 30% for improved function (03/03/17)   Time 6   Period Weeks   Status New     PT LONG TERM GOAL #3   Title improve FGA to >/= 18/30 for improved functional mobility and balance (03/03/17)   Time 6   Period Weeks   Status New     PT LONG TERM GOAL #4   Title improve gait velocity to > 2.62 ft/sec for improved community access (03/03/17)   Time 6   Period Weeks   Status New     PT LONG TERM GOAL #5   Title amb > 1000' on various indoor/outdoor surfaces independently for improved functional mobility (03/03/17)   Time 6   Period Weeks   Status New               Plan - 01/24/17 0932    Clinical Impression Statement Pt tolerated all exercises well, with expected increase in symptoms needing occasional rest breaks.  Significant staggering with amb with head turns needing min A.  Will continue to benefit from PT to maximize function.   PT Treatment/Interventions ADLs/Self Care Home Management;Canalith Repostioning;Neuromuscular re-education;Balance training;Therapeutic exercise;Therapeutic activities;Functional mobility training;Stair training;Gait training;Patient/family education;Vestibular   PT Next Visit Plan review HEP, compliant surface activities and dynamic gait   Consulted and Agree with Plan of Care Patient      Patient will benefit from skilled therapeutic intervention in order to improve the following deficits and impairments:  Abnormal gait, Decreased balance, Decreased mobility, Difficulty walking, Dizziness  Visit Diagnosis: Dizziness and giddiness  Unsteadiness on feet     Problem List Patient Active Problem List   Diagnosis Date Noted  . Atherosclerotic peripheral vascular disease ([place])   . Essential hypertension, benign 11/22/2013  . Osteopenia 11/22/2013  . Labial fusion      Laureen Abrahams, PT, DPT 01/24/17 9:35 AM    [facility] [address] [place], [place], [address] Phone: [phone]   Fax:  [phone]  Name: Lakitta Petriello MRN: [id number] Date of Birth: 05-29-1941

## 2017-01-27 ENCOUNTER — Ambulatory Visit: Payer: Medicare Other | Admitting: Physical Therapy

## 2017-01-27 DIAGNOSIS — R42 Dizziness and giddiness: Secondary | ICD-10-CM

## 2017-01-27 DIAGNOSIS — R2681 Unsteadiness on feet: Secondary | ICD-10-CM

## 2017-01-27 NOTE — Therapy (Signed)
[facility] [address] [place], [place], [address] Phone: [phone]   Fax:  [phone]  Physical Therapy Treatment  Patient Details  Name: Amneris Grivas MRN: [id number] Date of Birth: 06/15/1941 Referring Provider: Dr. Melida Quitter  Encounter Date: 01/27/2017      PT End of Session - 01/27/17 1732    Visit Number 3   Number of Visits 12   Date for PT Re-Evaluation 03/03/17   Authorization Type [employer] Medicare   PT Start Time 0850   PT Stop Time 0934   PT Time Calculation (min) 44 min      Past Medical History:  Diagnosis Date  . Abnormal Pap smear of vagina   . Allergic sinusitis   . Anxiety   . Atherosclerotic peripheral vascular disease ([place])   . Carotid artery stenosis   . Cellulitis   . COPD with asthma ([place])    not an issue now  . Depression   . DJD (degenerative joint disease)   . Elevated cholesterol   . Elevated MCV    secondary to alchol  . GERD (gastroesophageal reflux disease)    not an issue now.  . Gout   . Hearing difficulty    bilateral hearing aids  . History of recurrent UTIs   . Hypertension    loss weight'off meds now"  . Hypothyroidism   . IBS (irritable bowel syndrome)   . Insomnia   . Labial fusion   . Osteoarthritis of left knee   . Osteopenia   . Recurrent vaginitis   . Sjogren's syndrome ([place])   . Syncope 11.7.14   secondary to dehydration and possible hypoglycemia  . Tendonitis   . Thyroid disease   . Transfusion history    age 42 "anemia"    Past Surgical History:  Procedure Laterality Date  . CATARACT EXTRACTION, BILATERAL Bilateral   . CHOLECYSTECTOMY     open  . COLONOSCOPY WITH PROPOFOL N/A 03/23/2016   Procedure: COLONOSCOPY WITH PROPOFOL;  Surgeon: Garlan Fair, MD;  Location: [facility];  Service: Endoscopy;  Laterality: N/A;  . EYE SURGERY    . FOOT SURGERY Left    retained hardware  . KNEE SURGERY Left    scope   . TONSILLECTOMY       There were no vitals filed for this visit.      Subjective Assessment - 01/27/17 1723    Subjective Pt reports she loses her balance when doing laundry, as she has to turn from side to side; reports she had some knee pain in LLE after doing rockerboard last session but states it is better now   Pertinent History anxiety, PVD, COPD, depression, DJD, HTN   Limitations Walking   Patient Stated Goals improve dizziness, improve balance   Currently in Pain? No/denies                         [facility] PT Treatment/Exercise - 01/27/17 0859      Ambulation/Gait   Ambulation/Gait Yes   Ambulation/Gait Assistance 5: Supervision   Ambulation/Gait Assistance Details Amb. with horizontal , vertical and diagonal head turns    Ambulation Distance (Feet) 120 Feet   Assistive device None   Gait Pattern Step-through pattern   Ambulation Surface Level;Indoor             Balance Exercises - 01/27/17 1727      Balance Exercises: Standing   Standing Eyes Opened Wide (BOA);Head  turns;Foam/compliant surface   Standing Eyes Closed Wide (BOA);Foam/compliant surface;Head turns   Stepping Strategy Anterior;Posterior  on incline and on decline   Gait with Head Turns Forward  making circles clockwise and counterclockwise with ball    Other Standing Exercises marching in place on incline with EO and with EC with horizontal and vertical head turns     Practiced walking with quick 180 turns/stopping; 4 reps inside bars Pt performed balance on foam exercises in corner - with EO and EC - targets with EO - with feet apart and feet together  Alternate stepping on incline and on decline with EO and EC with head turns side to side and up/down with CGA to min A  Marching in place on incline and on decline with EO and EC and with head turns horizontal and vertical     PT Education - 01/27/17 1731    Education provided Yes   Education Details Recommended pt progress x1 viewing  exercise from seated to standing position; also recommended pt begin amb. with head turns in her home   Person(s) Educated Patient   Methods Explanation;Demonstration   Comprehension Verbalized understanding;Returned demonstration          PT Short Term Goals - 01/20/17 1629      PT SHORT TERM GOAL #1   Title STGs=LTGs           PT Long Term Goals - 01/20/17 1627      PT LONG TERM GOAL #1   Title independent with HEP (03/03/17)   Time 6   Period Weeks   Status New     PT LONG TERM GOAL #2   Title improve DHI to < 30% for improved function (03/03/17)   Time 6   Period Weeks   Status New     PT LONG TERM GOAL #3   Title improve FGA to >/= 18/30 for improved functional mobility and balance (03/03/17)   Time 6   Period Weeks   Status New     PT LONG TERM GOAL #4   Title improve gait velocity to > 2.62 ft/sec for improved community access (03/03/17)   Time 6   Period Weeks   Status New     PT LONG TERM GOAL #5   Title amb > 1000' on various indoor/outdoor surfaces independently for improved functional mobility (03/03/17)   Time 6   Period Weeks   Status New               Plan - 01/27/17 1732    Clinical Impression Statement Pt required frequent short seated rest breaks with vestibular standing balance activities:  pt reported some dysequilibrium with standing balance activities on compliant surfaces but overall tolerated activities well with no significant c/o dizziness   Rehab Potential Good   PT Frequency 2x / week   PT Duration 6 weeks   PT Treatment/Interventions ADLs/Self Care Home Management;Canalith Repostioning;Neuromuscular re-education;Balance training;Therapeutic exercise;Therapeutic activities;Functional mobility training;Stair training;Gait training;Patient/family education;Vestibular   PT Next Visit Plan cont with vestibular/gait activities on compliant surfaces   Consulted and Agree with Plan of Care Patient      Patient will benefit from  skilled therapeutic intervention in order to improve the following deficits and impairments:  Abnormal gait, Decreased balance, Decreased mobility, Difficulty walking, Dizziness  Visit Diagnosis: Unsteadiness on feet  Dizziness and giddiness     Problem List Patient Active Problem List   Diagnosis Date Noted  . Atherosclerotic peripheral vascular disease ([place])   .  Essential hypertension, benign 11/22/2013  . Osteopenia 11/22/2013  . Labial fusion     Alda Lea, PT 01/27/2017, 5:50 PM  [facility] [address] [place], [place], [address] Phone: [phone]   Fax:  [phone]  Name: Breck Chrisley MRN: [id number] Date of Birth: 12/13/1940

## 2017-01-31 ENCOUNTER — Ambulatory Visit: Payer: Medicare Other | Admitting: Physical Therapy

## 2017-01-31 DIAGNOSIS — R2681 Unsteadiness on feet: Secondary | ICD-10-CM

## 2017-01-31 DIAGNOSIS — R42 Dizziness and giddiness: Secondary | ICD-10-CM

## 2017-01-31 NOTE — Therapy (Signed)
[facility] [address] [place], [place], [address] Phone: [phone]   Fax:  [phone]  Physical Therapy Treatment  Patient Details  Name: Christina Sellers MRN: [id number] Date of Birth: 27-Sep-1941 Referring Provider: Dr. Melida Quitter  Encounter Date: 01/31/2017      PT End of Session - 01/31/17 1618    Visit Number 4   Number of Visits 12   Date for PT Re-Evaluation 03/03/17   Authorization Type UHC Medicare   PT Start Time 1535   PT Stop Time 1615   PT Time Calculation (min) 40 min   Equipment Utilized During Treatment Gait belt   Activity Tolerance Patient tolerated treatment well      Past Medical History:  Diagnosis Date  . Abnormal Pap smear of vagina   . Allergic sinusitis   . Anxiety   . Atherosclerotic peripheral vascular disease ([place])   . Carotid artery stenosis   . Cellulitis   . COPD with asthma ([place])    not an issue now  . Depression   . DJD (degenerative joint disease)   . Elevated cholesterol   . Elevated MCV    secondary to alchol  . GERD (gastroesophageal reflux disease)    not an issue now.  . Gout   . Hearing difficulty    bilateral hearing aids  . History of recurrent UTIs   . Hypertension    loss weight'off meds now"  . Hypothyroidism   . IBS (irritable bowel syndrome)   . Insomnia   . Labial fusion   . Osteoarthritis of left knee   . Osteopenia   . Recurrent vaginitis   . Sjogren's syndrome ([place])   . Syncope 11.7.14   secondary to dehydration and possible hypoglycemia  . Tendonitis   . Thyroid disease   . Transfusion history    age 76 "anemia"    Past Surgical History:  Procedure Laterality Date  . CATARACT EXTRACTION, BILATERAL Bilateral   . CHOLECYSTECTOMY     open  . COLONOSCOPY WITH PROPOFOL N/A 03/23/2016   Procedure: COLONOSCOPY WITH PROPOFOL;  Surgeon: Garlan Fair, MD;  Location: [facility];  Service: Endoscopy;  Laterality: N/A;  . EYE SURGERY     . FOOT SURGERY Left    retained hardware  . KNEE SURGERY Left    scope   . TONSILLECTOMY      There were no vitals filed for this visit.      Subjective Assessment - 01/31/17 1537    Subjective doing pretty well, until yesterday.  watched a home movie yesterday and had a lot of trouble watching due to the visual stimulation.  can't do exercises 3x/day due to knee pain.   Pertinent History anxiety, PVD, COPD, depression, DJD, HTN   Patient Stated Goals improve dizziness, improve balance   Currently in Pain? No/denies                              Balance Exercises - 01/31/17 1553      Balance Exercises: Standing   Balance Beam EC 3x10 sec; feet together with horizontal/vertical head turns x 10    Gait with Head Turns Forward  horizontal/vertical/bil diagonals 40' x 2 each with min A   Other Standing Exercises dynamic gait with visual scanning activities; with ball toss horizontal/vertical; increased dizziness with horizontal, resolved after 4 min rest break  PT Short Term Goals - 01/20/17 1629      PT SHORT TERM GOAL #1   Title STGs=LTGs           PT Long Term Goals - 01/20/17 1627      PT LONG TERM GOAL #1   Title independent with HEP (03/03/17)   Time 6   Period Weeks   Status New     PT LONG TERM GOAL #2   Title improve DHI to < 30% for improved function (03/03/17)   Time 6   Period Weeks   Status New     PT LONG TERM GOAL #3   Title improve FGA to >/= 18/30 for improved functional mobility and balance (03/03/17)   Time 6   Period Weeks   Status New     PT LONG TERM GOAL #4   Title improve gait velocity to > 2.62 ft/sec for improved community access (03/03/17)   Time 6   Period Weeks   Status New     PT LONG TERM GOAL #5   Title amb > 1000' on various indoor/outdoor surfaces independently for improved functional mobility (03/03/17)   Time 6   Period Weeks   Status New               Plan - 01/31/17  1618    Clinical Impression Statement Pt needed rest breaks due to fatigue and mild increase in symptoms with ball toss horizontal.  Pt with slight regression due to watching home movie which was visually distracting and symptom provoking.  Will continue to benefit from PT to [facility] function.   PT Treatment/Interventions ADLs/Self Care Home Management;Canalith Repostioning;Neuromuscular re-education;Balance training;Therapeutic exercise;Therapeutic activities;Functional mobility training;Stair training;Gait training;Patient/family education;Vestibular   PT Next Visit Plan cont with vestibular/gait activities on compliant surfaces   Consulted and Agree with Plan of Care Patient      Patient will benefit from skilled therapeutic intervention in order to improve the following deficits and impairments:  Abnormal gait, Decreased balance, Decreased mobility, Difficulty walking, Dizziness  Visit Diagnosis: Unsteadiness on feet  Dizziness and giddiness     Problem List Patient Active Problem List   Diagnosis Date Noted  . Atherosclerotic peripheral vascular disease ([place])   . Essential hypertension, benign 11/22/2013  . Osteopenia 11/22/2013  . Labial fusion       Christina Sellers, PT, DPT 01/31/17 4:20 PM    [facility] [address] [place], [place], [address] Phone: [phone]   Fax:  [phone]  Name: Christina Sellers MRN: [id number] Date of Birth: [birth year]

## 2017-02-03 ENCOUNTER — Ambulatory Visit: Payer: Medicare Other | Admitting: Physical Therapy

## 2017-02-07 ENCOUNTER — Ambulatory Visit: Payer: Medicare Other | Attending: Otolaryngology | Admitting: Physical Therapy

## 2017-02-07 DIAGNOSIS — R2681 Unsteadiness on feet: Secondary | ICD-10-CM | POA: Diagnosis present

## 2017-02-07 DIAGNOSIS — R42 Dizziness and giddiness: Secondary | ICD-10-CM | POA: Insufficient documentation

## 2017-02-07 NOTE — Patient Instructions (Signed)
    Lie down into bed on your back.  Roll over and sit up, then stand up.  Hold until symptoms subside plus 20-30 seconds.  Repeat 5-6 times.   Perform 3 sessions per day.

## 2017-02-07 NOTE — Therapy (Signed)
[facility] [address] [place], [place], [address] Phone: [phone]   Fax:  [phone]  Physical Therapy Treatment  Patient Details  Name: Christina Sellers MRN: [id number] Date of Birth: 1941-01-23 Referring Provider: Dr. Melida Quitter  Encounter Date: 02/07/2017      PT End of Session - 02/07/17 1256    Visit Number 5   Number of Visits 12   Date for PT Re-Evaluation 03/03/17   Authorization Type UHC Medicare   PT Start Time [id number]   PT Stop Time 1228   PT Time Calculation (min) 43 min   Activity Tolerance Patient tolerated treatment well   Behavior During Therapy [facility] for tasks assessed/performed      Past Medical History:  Diagnosis Date  . Abnormal Pap smear of vagina   . Allergic sinusitis   . Anxiety   . Atherosclerotic peripheral vascular disease ([place])   . Carotid artery stenosis   . Cellulitis   . COPD with asthma ([place])    not an issue now  . Depression   . DJD (degenerative joint disease)   . Elevated cholesterol   . Elevated MCV    secondary to alchol  . GERD (gastroesophageal reflux disease)    not an issue now.  . Gout   . Hearing difficulty    bilateral hearing aids  . History of recurrent UTIs   . Hypertension    loss weight'off meds now"  . Hypothyroidism   . IBS (irritable bowel syndrome)   . Insomnia   . Labial fusion   . Osteoarthritis of left knee   . Osteopenia   . Recurrent vaginitis   . Sjogren's syndrome ([place])   . Syncope 11.7.14   secondary to dehydration and possible hypoglycemia  . Tendonitis   . Thyroid disease   . Transfusion history    age 70 "anemia"    Past Surgical History:  Procedure Laterality Date  . CATARACT EXTRACTION, BILATERAL Bilateral   . CHOLECYSTECTOMY     open  . COLONOSCOPY WITH PROPOFOL N/A 03/23/2016   Procedure: COLONOSCOPY WITH PROPOFOL;  Surgeon: Garlan Fair, MD;  Location: [facility];  Service: Endoscopy;  Laterality: N/A;  .  EYE SURGERY    . FOOT SURGERY Left    retained hardware  . KNEE SURGERY Left    scope   . TONSILLECTOMY      There were no vitals filed for this visit.      Subjective Assessment - 02/07/17 1144    Subjective "doing pretty good"  reports she's had a "flare up" in her back.  in a lot of pain on R hip and she thinks that's coming from her back.  Feels this is due to rockerboard and foam.  still having a little bit of "dizziness" with sitting up described as lightheadedness and lasting only seconds   Pertinent History anxiety, PVD, COPD, depression, DJD, HTN   Patient Stated Goals improve dizziness, improve balance   Currently in Pain? Yes   Pain Score 4    Pain Location Hip  R iliac crest   Pain Orientation Right   Pain Descriptors / Indicators Aching   Pain Type Chronic pain;Acute pain   Pain Onset 1 to 4 weeks ago   Pain Frequency Constant   Aggravating Factors  rockerboard and compliant surface   Pain Relieving Factors aleve                Vestibular Assessment - 02/07/17 1150  Orthostatics   BP supine (x 5 minutes) 131/78   HR supine (x 5 minutes) 80   BP standing (after 1 minute) 149/77  2 min due to machine error   HR standing (after 1 minute) 81   BP standing (after 3 minutes) 148/78   HR standing (after 3 minutes) 82   Orthostatics Comment pt with positive symptoms initially with supine to stand; unable to get BP reading at 1 min due to machine error (did obtain at ~ 2 min)                  Vestibular Treatment/Exercise - 02/07/17 0001      Vestibular Treatment/Exercise   Vestibular Treatment Provided Habituation   Habituation Exercises Comment  supine to stand x 5 reps            Balance Exercises - 02/07/17 1254      Balance Exercises: Standing   Other Standing Exercises amb with ball toss vertical/horizontal as well as figure-8's without increase in symptoms           PT Education - 02/07/17 1255    Education provided  Yes   Education Details habituation, orthostatic hypotension   Person(s) Educated Patient   Methods Explanation;Demonstration;Handout   Comprehension Verbalized understanding;Returned demonstration          PT Short Term Goals - 01/20/17 1629      PT SHORT TERM GOAL #1   Title STGs=LTGs           PT Long Term Goals - 01/20/17 1627      PT LONG TERM GOAL #1   Title independent with HEP (03/03/17)   Time 6   Period Weeks   Status New     PT LONG TERM GOAL #2   Title improve DHI to < 30% for improved function (03/03/17)   Time 6   Period Weeks   Status New     PT LONG TERM GOAL #3   Title improve FGA to >/= 18/30 for improved functional mobility and balance (03/03/17)   Time 6   Period Weeks   Status New     PT LONG TERM GOAL #4   Title improve gait velocity to > 2.62 ft/sec for improved community access (03/03/17)   Time 6   Period Weeks   Status New     PT LONG TERM GOAL #5   Title amb > 1000' on various indoor/outdoor surfaces independently for improved functional mobility (03/03/17)   Time 6   Period Weeks   Status New               Plan - 02/07/17 1257    Clinical Impression Statement Pt's subjective of current symptoms (only when going supine to stand) suggestive of orthostatic hypotension.  Assessed today but unable to get true accurate reading at 1 min standing due to machine error.  Pt symptomatic with transitional movements so feel pt should discuss with MD.  Pt requesting to avoid compliant surfaces due to increase in back and R hip pain which she feels is due to standing on compliant surfaces.  Treatment options limited due to this, but tolerated dynamic gait well without increase in symptoms.   PT Treatment/Interventions ADLs/Self Care Home Management;Canalith Repostioning;Neuromuscular re-education;Balance training;Therapeutic exercise;Therapeutic activities;Functional mobility training;Stair training;Gait training;Patient/family  education;Vestibular   PT Next Visit Plan cont with vestibular/gait activities as tolerated   Consulted and Agree with Plan of Care Patient      Patient will benefit from  skilled therapeutic intervention in order to improve the following deficits and impairments:  Abnormal gait, Decreased balance, Decreased mobility, Difficulty walking, Dizziness  Visit Diagnosis: Unsteadiness on feet  Dizziness and giddiness     Problem List Patient Active Problem List   Diagnosis Date Noted  . Atherosclerotic peripheral vascular disease ([place])   . Essential hypertension, benign 11/22/2013  . Osteopenia 11/22/2013  . Labial fusion       Laureen Abrahams, PT, DPT 02/07/17 1:00 PM     [facility] [address] [place], [place], [address] Phone: [phone]   Fax:  [phone]  Name: Christina Sellers MRN: [id number] Date of Birth: 1941/11/05

## 2017-02-09 ENCOUNTER — Encounter: Payer: Self-pay | Admitting: Physical Therapy

## 2017-02-09 ENCOUNTER — Ambulatory Visit: Payer: Medicare Other | Admitting: Physical Therapy

## 2017-02-09 DIAGNOSIS — R42 Dizziness and giddiness: Secondary | ICD-10-CM

## 2017-02-09 DIAGNOSIS — R2681 Unsteadiness on feet: Secondary | ICD-10-CM | POA: Diagnosis not present

## 2017-02-10 NOTE — Therapy (Signed)
[facility] [address] [place], [place], [address] Phone: [phone]   Fax:  [phone]  Physical Therapy Treatment  Patient Details  Name: Christina Sellers MRN: [id number] Date of Birth: Jan 18, 1941 Referring Provider: Dr. Melida Quitter  Encounter Date: 02/09/2017      PT End of Session - 02/09/17 1240    Visit Number 6   Number of Visits 12   Date for PT Re-Evaluation 03/03/17   Authorization Type UHC Medicare   PT Start Time 1232   PT Stop Time 1315   PT Time Calculation (min) 43 min   Activity Tolerance Patient tolerated treatment well   Behavior During Therapy [facility] for tasks assessed/performed      Past Medical History:  Diagnosis Date  . Abnormal Pap smear of vagina   . Allergic sinusitis   . Anxiety   . Atherosclerotic peripheral vascular disease ([place])   . Carotid artery stenosis   . Cellulitis   . COPD with asthma ([place])    not an issue now  . Depression   . DJD (degenerative joint disease)   . Elevated cholesterol   . Elevated MCV    secondary to alchol  . GERD (gastroesophageal reflux disease)    not an issue now.  . Gout   . Hearing difficulty    bilateral hearing aids  . History of recurrent UTIs   . Hypertension    loss weight'off meds now"  . Hypothyroidism   . IBS (irritable bowel syndrome)   . Insomnia   . Labial fusion   . Osteoarthritis of left knee   . Osteopenia   . Recurrent vaginitis   . Sjogren's syndrome ([place])   . Syncope 11.7.14   secondary to dehydration and possible hypoglycemia  . Tendonitis   . Thyroid disease   . Transfusion history    age 48 "anemia"    Past Surgical History:  Procedure Laterality Date  . CATARACT EXTRACTION, BILATERAL Bilateral   . CHOLECYSTECTOMY     open  . COLONOSCOPY WITH PROPOFOL N/A 03/23/2016   Procedure: COLONOSCOPY WITH PROPOFOL;  Surgeon: Garlan Fair, MD;  Location: [facility];  Service: Endoscopy;  Laterality: N/A;  .  EYE SURGERY    . FOOT SURGERY Left    retained hardware  . KNEE SURGERY Left    scope   . TONSILLECTOMY      There were no vitals filed for this visit.      Subjective Assessment - 02/09/17 1234    Subjective No new complaints. No falls to report. Has been doing the new exercise and still getting lightheaded with the standing. Has an icy hot patch on her hip today and took an aleve before the session so she reports "i am good today". Pt willing to work on complaint surfaces today, "just not too much"   Pertinent History anxiety, PVD, COPD, depression, DJD, HTN   Limitations Walking   Patient Stated Goals improve dizziness, improve balance   Currently in Pain? Yes   Pain Score 1    Pain Location Hip   Pain Orientation Right   Pain Descriptors / Indicators Aching;Sore   Pain Onset 1 to 4 weeks ago   Pain Frequency Constant   Aggravating Factors  rockerboard and complaint surface   Pain Relieving Factors aleve, icy hot patches            Vestibular Assessment - 02/09/17 1243      Orthostatics   BP supine (x  5 minutes) 132/78   BP standing (after 1 minute) 140/72   BP standing (after 3 minutes) 146/76        02/09/17 1301  Ambulation/Gait  Gait Comments gait along track: while self tossing ball up/down and tracking with eyes x 1 lap; with ball pass left<>right while tracking with eyes x 1 lap. min guard assist for balance                           02/09/17 1300  Balance Exercises: Standing  Standing Eyes Closed Narrow base of support (BOS);Head turns;Other reps (comment);20 secs;Limitations  Other Standing Exercises blue mat over ramp: performed facing both up and down ramp without UE support- fwd stepping with weight shifting and then back into place x 10 reps each leg; with narrow base of support- EC no head movements, progressing to EC with head movements up<>down and left<>right. min guard to min assist for balance.                      Balance Exercises: Standing   Standing Eyes Closed Limitations on airex in corner with chair in front for safety: narrow base of support- EC no head movements 20 sec's x 3 reps, EC head movements left<>right and up<>down x 10 reps each, min guard to min assist for balance.           PT Short Term Goals - 01/20/17 1629      PT SHORT TERM GOAL #1   Title STGs=LTGs           PT Long Term Goals - 01/20/17 1627      PT LONG TERM GOAL #1   Title independent with HEP (03/03/17)   Time 6   Period Weeks   Status New     PT LONG TERM GOAL #2   Title improve DHI to < 30% for improved function (03/03/17)   Time 6   Period Weeks   Status New     PT LONG TERM GOAL #3   Title improve FGA to >/= 18/30 for improved functional mobility and balance (03/03/17)   Time 6   Period Weeks   Status New     PT LONG TERM GOAL #4   Title improve gait velocity to > 2.62 ft/sec for improved community access (03/03/17)   Time 6   Period Weeks   Status New     PT LONG TERM GOAL #5   Title amb > 1000' on various indoor/outdoor surfaces independently for improved functional mobility (03/03/17)   Time 6   Period Weeks   Status New            Plan - 02/09/17 1241    Clinical Impression Statement During today's skilled session rechecked orthostatics using manual BP cuff with negative results. Remainder of session continued to work on balance with emphasis on vestibular system imput with out any increase in pain reported. Pt is making steady progress toward goals and should benefit from continued PT to progress toward unmet goals.                      PT Treatment/Interventions ADLs/Self Care Home Management;Canalith Repostioning;Neuromuscular re-education;Balance training;Therapeutic exercise;Therapeutic activities;Functional mobility training;Stair training;Gait training;Patient/family education;Vestibular   PT Next Visit Plan cont with vestibular/gait activities as tolerated   Consulted and Agree with Plan of Care Patient       Patient will benefit from skilled therapeutic  intervention in order to improve the following deficits and impairments:  Abnormal gait, Decreased balance, Decreased mobility, Difficulty walking, Dizziness  Visit Diagnosis: Unsteadiness on feet  Dizziness and giddiness     Problem List Patient Active Problem List   Diagnosis Date Noted  . Atherosclerotic peripheral vascular disease ([place])   . Essential hypertension, benign 11/22/2013  . Osteopenia 11/22/2013  . Labial fusion     Willow Ora, PTA, [facility] [address] [place], [place] [address] [phone] 02/11/17, 7:24 PM   Name: Christina Sellers MRN: [id number] Date of Birth: [birth year]

## 2017-02-14 ENCOUNTER — Ambulatory Visit: Payer: Medicare Other | Admitting: Physical Therapy

## 2017-02-14 ENCOUNTER — Encounter: Payer: Medicare Other | Admitting: Physical Therapy

## 2017-02-17 ENCOUNTER — Ambulatory Visit: Payer: Medicare Other | Admitting: Physical Therapy

## 2017-02-17 ENCOUNTER — Encounter: Payer: Self-pay | Admitting: Physical Therapy

## 2017-02-17 DIAGNOSIS — R2681 Unsteadiness on feet: Secondary | ICD-10-CM | POA: Diagnosis not present

## 2017-02-17 DIAGNOSIS — R42 Dizziness and giddiness: Secondary | ICD-10-CM

## 2017-02-17 NOTE — Therapy (Signed)
[facility] [address] [place], [place], [address] Phone: [phone]   Fax:  [phone]  Physical Therapy Treatment  Patient Details  Name: Christina Sellers MRN: [id number] Date of Birth: 1940-12-23 Referring Provider: Dr. Melida Quitter  Encounter Date: 02/17/2017      PT End of Session - 02/17/17 1103    Visit Number 7   Number of Visits 12   Date for PT Re-Evaluation 03/03/17   Authorization Type UHC Medicare   PT Start Time 1020   PT Stop Time 1100   PT Time Calculation (min) 40 min   Activity Tolerance Patient tolerated treatment well   Behavior During Therapy [facility] for tasks assessed/performed      Past Medical History:  Diagnosis Date  . Abnormal Pap smear of vagina   . Allergic sinusitis   . Anxiety   . Atherosclerotic peripheral vascular disease ([place])   . Carotid artery stenosis   . Cellulitis   . COPD with asthma ([place])    not an issue now  . Depression   . DJD (degenerative joint disease)   . Elevated cholesterol   . Elevated MCV    secondary to alchol  . GERD (gastroesophageal reflux disease)    not an issue now.  . Gout   . Hearing difficulty    bilateral hearing aids  . History of recurrent UTIs   . Hypertension    loss weight'off meds now"  . Hypothyroidism   . IBS (irritable bowel syndrome)   . Insomnia   . Labial fusion   . Osteoarthritis of left knee   . Osteopenia   . Recurrent vaginitis   . Sjogren's syndrome ([place])   . Syncope 11.7.14   secondary to dehydration and possible hypoglycemia  . Tendonitis   . Thyroid disease   . Transfusion history    age 2 "anemia"    Past Surgical History:  Procedure Laterality Date  . CATARACT EXTRACTION, BILATERAL Bilateral   . CHOLECYSTECTOMY     open  . COLONOSCOPY WITH PROPOFOL N/A 03/23/2016   Procedure: COLONOSCOPY WITH PROPOFOL;  Surgeon: Garlan Fair, MD;  Location: [facility];  Service: Endoscopy;  Laterality: N/A;  .  EYE SURGERY    . FOOT SURGERY Left    retained hardware  . KNEE SURGERY Left    scope   . TONSILLECTOMY      There were no vitals filed for this visit.      Subjective Assessment - 02/17/17 1022    Subjective Pt reports having a "stomach bug" yesterday with diarrhea; no nausea or vomiting.  Reports last week having an episode of vertigo when lying down on the couch and then when sitting back upright; 30 seconds in duration.   Pertinent History anxiety, PVD, COPD, depression, DJD, HTN   Limitations Walking   Patient Stated Goals improve dizziness, improve balance   Currently in Pain? No/denies                Vestibular Assessment - 02/17/17 1025      Visual Acuity   Static 8   Dynamic 4     Positional Testing   Dix-Hallpike Dix-Hallpike Right;Dix-Hallpike Left   Sidelying Test --     Dix-Hallpike Right   Dix-Hallpike Right Duration 0   Dix-Hallpike Right Symptoms No nystagmus     Dix-Hallpike Left   Dix-Hallpike Left Duration 20 seconds   Dix-Hallpike Left Symptoms Upbeat, left rotatory nystagmus  Vestibular Treatment/Exercise - 02/17/17 1034      Vestibular Treatment/Exercise   Vestibular Treatment Provided Canalith Repositioning   Canalith Repositioning Epley Manuever Left      EPLEY MANUEVER LEFT   Number of Reps  2   Overall Response  Symptoms Resolved     X1 Viewing Horizontal   Foot Position seated   Time --  10, 20, 30 repetitions   Reps 4   Comments minimal increase in symptoms     X1 Viewing Vertical   Foot Position seated    Time --  10, 20, 30 repetitions   Reps 4   Comments minimal increase in symptoms               PT Education - 02/17/17 1103    Education provided Yes   Education Details BPPV, DVA findings, HEP adjustment-increase to 30 repetitions, faster speed   Person(s) Educated Patient   Methods Explanation   Comprehension Verbalized understanding;Returned demonstration          PT  Short Term Goals - 01/20/17 1629      PT SHORT TERM GOAL #1   Title STGs=LTGs           PT Long Term Goals - 01/20/17 1627      PT LONG TERM GOAL #1   Title independent with HEP (03/03/17)   Time 6   Period Weeks   Status New     PT LONG TERM GOAL #2   Title improve DHI to < 30% for improved function (03/03/17)   Time 6   Period Weeks   Status New     PT LONG TERM GOAL #3   Title improve FGA to >/= 18/30 for improved functional mobility and balance (03/03/17)   Time 6   Period Weeks   Status New     PT LONG TERM GOAL #4   Title improve gait velocity to > 2.62 ft/sec for improved community access (03/03/17)   Time 6   Period Weeks   Status New     PT LONG TERM GOAL #5   Title amb > 1000' on various indoor/outdoor surfaces independently for improved functional mobility (03/03/17)   Time 6   Period Weeks   Status New               Plan - 02/17/17 1223    Clinical Impression Statement Due to pt c/o spinning vertigo with supine <> sit at home pt assessed again for BPPV and found to have L posterior canal canalithiasis, treated successfully with two Epley maneuvers.  Performed assessment of DVA which was significant for impaired gaze stability.  Continued x 1 viewing training with pt tolerating progression of duration and intensity.  Will continue to address.   PT Treatment/Interventions ADLs/Self Care Home Management;Canalith Repostioning;Neuromuscular re-education;Balance training;Therapeutic exercise;Therapeutic activities;Functional mobility training;Stair training;Gait training;Patient/family education;Vestibular   PT Next Visit Plan Reassess L posterior canal; treat if indicated.  Progress x 1 viewing to standing      Patient will benefit from skilled therapeutic intervention in order to improve the following deficits and impairments:  Abnormal gait, Decreased balance, Decreased mobility, Difficulty walking, Dizziness  Visit Diagnosis: Unsteadiness on  feet  Dizziness and giddiness     Problem List Patient Active Problem List   Diagnosis Date Noted  . Atherosclerotic peripheral vascular disease ([place])   . Essential hypertension, benign 11/22/2013  . Osteopenia 11/22/2013  . Labial fusion    Raylene Everts, PT, DPT 02/17/17  12:27 PM    [facility] [address] [place], [place], [address] Phone: [phone]   Fax:  [phone]  Name: Christina Sellers MRN: [id number] Date of Birth: 03/24/1941

## 2017-02-21 ENCOUNTER — Ambulatory Visit: Payer: Medicare Other | Admitting: Physical Therapy

## 2017-02-21 ENCOUNTER — Encounter: Payer: Self-pay | Admitting: Physical Therapy

## 2017-02-21 DIAGNOSIS — R2681 Unsteadiness on feet: Secondary | ICD-10-CM

## 2017-02-21 DIAGNOSIS — R42 Dizziness and giddiness: Secondary | ICD-10-CM

## 2017-02-21 NOTE — Therapy (Signed)
[facility] [address] [place], [place], [address] Phone: [phone]   Fax:  [phone]  Physical Therapy Treatment  Patient Details  Name: Christina Sellers MRN: [id number] Date of Birth: Nov 04, 1941 Referring Provider: Dr. Melida Quitter  Encounter Date: 02/21/2017      PT End of Session - 02/21/17 0946    Visit Number 8   Number of Visits 12   Date for PT Re-Evaluation 03/03/17   Authorization Type UHC Medicare-10 visit G code and progress note   PT Start Time 0806   PT Stop Time 0847   PT Time Calculation (min) 41 min   Activity Tolerance Patient tolerated treatment well   Behavior During Therapy [facility] for tasks assessed/performed      Past Medical History:  Diagnosis Date  . Abnormal Pap smear of vagina   . Allergic sinusitis   . Anxiety   . Atherosclerotic peripheral vascular disease ([place])   . Carotid artery stenosis   . Cellulitis   . COPD with asthma ([place])    not an issue now  . Depression   . DJD (degenerative joint disease)   . Elevated cholesterol   . Elevated MCV    secondary to alchol  . GERD (gastroesophageal reflux disease)    not an issue now.  . Gout   . Hearing difficulty    bilateral hearing aids  . History of recurrent UTIs   . Hypertension    loss weight'off meds now"  . Hypothyroidism   . IBS (irritable bowel syndrome)   . Insomnia   . Labial fusion   . Osteoarthritis of left knee   . Osteopenia   . Recurrent vaginitis   . Sjogren's syndrome ([place])   . Syncope 11.7.14   secondary to dehydration and possible hypoglycemia  . Tendonitis   . Thyroid disease   . Transfusion history    age 54 "anemia"    Past Surgical History:  Procedure Laterality Date  . CATARACT EXTRACTION, BILATERAL Bilateral   . CHOLECYSTECTOMY     open  . COLONOSCOPY WITH PROPOFOL N/A 03/23/2016   Procedure: COLONOSCOPY WITH PROPOFOL;  Surgeon: Garlan Fair, MD;  Location: [facility];  Service:  Endoscopy;  Laterality: N/A;  . EYE SURGERY    . FOOT SURGERY Left    retained hardware  . KNEE SURGERY Left    scope   . TONSILLECTOMY      There were no vitals filed for this visit.      Subjective Assessment - 02/21/17 0808    Subjective Pt reports thinking she was doing better after the last visit but still having some dizziness when she turns around in standing.  Reports it as dysequilibrium, not vertigo.  No dizziness during supine <> sit.   Pertinent History anxiety, PVD, COPD, depression, DJD, HTN   Limitations Walking   Patient Stated Goals improve dizziness, improve balance   Currently in Pain? No/denies           Vestibular Assessment - 02/21/17 0811      Positional Testing   Dix-Hallpike Dix-Hallpike Right;Dix-Hallpike Left   Horizontal Canal Testing Horizontal Canal Right;Horizontal Canal Left     Dix-Hallpike Right   Dix-Hallpike Right Duration 0   Dix-Hallpike Right Symptoms No nystagmus     Dix-Hallpike Left   Dix-Hallpike Left Duration 0   Dix-Hallpike Left Symptoms No nystagmus     Horizontal Canal Right   Horizontal Canal Right Duration 0   Horizontal Canal Right  Symptoms Normal     Horizontal Canal Left   Horizontal Canal Left Duration 0   Horizontal Canal Left Symptoms Other (comment)  no nystagmus but reports feeling off balance     Positional Sensitivities   Sit to Supine No dizziness   Supine to Left Side No dizziness   Supine to Right Side No dizziness   Supine to Sitting No dizziness   Right Hallpike No dizziness   Up from Right Hallpike No dizziness   Up from Left Hallpike No dizziness   Nose to Right Knee No dizziness   Right Knee to Sitting Lightedness   Nose to Left Knee No dizziness   Left Knee to Sitting No dizziness   Head Turning x 5 Lightheadedness   Head Nodding x 5 Lightheadedness   Pivot Right in Standing No dizziness   Pivot Left in Standing Lightheadedness   Rolling Right No dizziness   Rolling Left No dizziness             Vestibular Treatment/Exercise - 02/21/17 3785      Vestibular Treatment/Exercise   Vestibular Treatment Provided Gaze;Habituation   Habituation Exercises Comment  90 turns, head then body   Gaze Exercises X1 Viewing Horizontal;X1 Viewing Vertical     180 degree Turns   Number of Reps  10   Symptom Description  90 deg turns to L and R while marching, head first to use gaze fixation and then turning body     X1 Viewing Horizontal   Foot Position feet together   Reps 30   Comments 30 head turns     X1 Viewing Vertical   Foot Position feet together   Reps 30   Comments 30 head turns            Balance Exercises - 02/21/17 0846      Balance Exercises: Standing   Standing Eyes Closed Narrow base of support (BOS);Head turns;Foam/compliant surface;Other reps (comment)  10 reps           PT Education - 02/21/17 0946    Education provided Yes   Education Details HEP, motion sensitivity   Person(s) Educated Patient   Methods Explanation;Demonstration;Handout   Comprehension Verbalized understanding;Returned demonstration          PT Short Term Goals - 01/20/17 1629      PT SHORT TERM GOAL #1   Title STGs=LTGs           PT Long Term Goals - 01/20/17 1627      PT LONG TERM GOAL #1   Title independent with HEP (03/03/17)   Time 6   Period Weeks   Status New     PT LONG TERM GOAL #2   Title improve DHI to < 30% for improved function (03/03/17)   Time 6   Period Weeks   Status New     PT LONG TERM GOAL #3   Title improve FGA to >/= 18/30 for improved functional mobility and balance (03/03/17)   Time 6   Period Weeks   Status New     PT LONG TERM GOAL #4   Title improve gait velocity to > 2.62 ft/sec for improved community access (03/03/17)   Time 6   Period Weeks   Status New     PT LONG TERM GOAL #5   Title amb > 1000' on various indoor/outdoor surfaces independently for improved functional mobility (03/03/17)   Time 6   Period  Weeks   Status New  Plan - 02/21/17 0947    Clinical Impression Statement Pt is making good progress towards LTG; pt no longer demonstrating spinning vertigo transitional movements but does continue to present with motion sensitivity and impaired gaze stability.  Upgraded HEP to standing and to incorporate turning 90 deg to L and R; pt reported improvement in symptoms with turning by end of session.  Will continue to address over next couple of sessions and then will likely D/C due to progress and improvement in symptoms.   Rehab Potential Good   PT Treatment/Interventions ADLs/Self Care Home Management;Canalith Repostioning;Neuromuscular re-education;Balance training;Therapeutic exercise;Therapeutic activities;Functional mobility training;Stair training;Gait training;Patient/family education;Vestibular   PT Next Visit Plan Review HEP, progress turning to 180 deg, 30 deg-add turns into gait training   Consulted and Agree with Plan of Care Patient      Patient will benefit from skilled therapeutic intervention in order to improve the following deficits and impairments:  Abnormal gait, Decreased balance, Decreased mobility, Difficulty walking, Dizziness  Visit Diagnosis: Unsteadiness on feet  Dizziness and giddiness     Problem List Patient Active Problem List   Diagnosis Date Noted  . Atherosclerotic peripheral vascular disease ([place])   . Essential hypertension, benign 11/22/2013  . Osteopenia 11/22/2013  . Labial fusion    Raylene Everts, PT, DPT 02/21/17    9:50 AM    [facility] [address] [place], [place], [address] Phone: [phone]   Fax:  [phone]  Name: Christina Sellers MRN: [id number] Date of Birth: 12-13-1940

## 2017-02-21 NOTE — Patient Instructions (Signed)
Gaze Stabilization - Tip Card  1.Target must remain in focus, not blurry, and appear stationary while head is in motion. 2.Perform exercises with small head movements (45 to either side of midline). 3.Increase speed of head motion so long as target is in focus. 4.If you wear eyeglasses, be sure you can see target through lens (therapist will give specific instructions for bifocal / progressive lenses). 5.These exercises may provoke dizziness or nausea. Work through these symptoms. If too dizzy, slow head movement slightly. Rest between each exercise. 6.Exercises demand concentration; avoid distractions. 7.For safety, perform standing exercises close to a counter, wall, corner, or next to someone.  Copyright  [employer]. All rights reserved.   Gaze Stabilization - Standing Feet Apart   Feet together as close as you can get them, keeping eyes on target on wall 3 feet away, tilt head down slightly and move head side to side for 30 times. Repeat while moving head up and down for 30 times. *Work up to tolerating 60 seconds, as able. Do 2-3 sessions per day.    Standing Marching   Using a chair if necessary, march in place in corner; turn head/eyes to right then body to right, turn eyes/head to middle, body to middle.  Turn eyes/head to left, body to left. Repeat 10 times. Do 2-3 sessions per day.  [email or web address]

## 2017-02-23 ENCOUNTER — Encounter: Payer: Medicare Other | Admitting: Physical Therapy

## 2017-02-24 ENCOUNTER — Encounter: Payer: Medicare Other | Admitting: Physical Therapy

## 2017-03-01 ENCOUNTER — Ambulatory Visit: Payer: Medicare Other | Admitting: Physical Therapy

## 2017-03-01 DIAGNOSIS — R2681 Unsteadiness on feet: Secondary | ICD-10-CM

## 2017-03-01 DIAGNOSIS — R42 Dizziness and giddiness: Secondary | ICD-10-CM

## 2017-03-01 NOTE — Therapy (Signed)
[facility] [address] [place], [place], [address] Phone: [phone]   Fax:  [phone]  Physical Therapy Treatment/Discharge  Patient Details  Name: Christina Sellers MRN: [id number] Date of Birth: 07/28/1941 Referring Provider: Dr. Melida Quitter  Encounter Date: 03/01/2017      PT End of Session - 03/01/17 1010    Visit Number [facility] visit G code and progress note   PT Start Time 0930   PT Stop Time 1011   PT Time Calculation (min) 41 min   Equipment Utilized During Treatment Gait belt   Activity Tolerance Patient tolerated treatment well   Behavior During Therapy [facility] for tasks assessed/performed      Past Medical History:  Diagnosis Date  . Abnormal Pap smear of vagina   . Allergic sinusitis   . Anxiety   . Atherosclerotic peripheral vascular disease ([place])   . Carotid artery stenosis   . Cellulitis   . COPD with asthma ([place])    not an issue now  . Depression   . DJD (degenerative joint disease)   . Elevated cholesterol   . Elevated MCV    secondary to alchol  . GERD (gastroesophageal reflux disease)    not an issue now.  . Gout   . Hearing difficulty    bilateral hearing aids  . History of recurrent UTIs   . Hypertension    loss weight'off meds now"  . Hypothyroidism   . IBS (irritable bowel syndrome)   . Insomnia   . Labial fusion   . Osteoarthritis of left knee   . Osteopenia   . Recurrent vaginitis   . Sjogren's syndrome ([place])   . Syncope 11.7.14   secondary to dehydration and possible hypoglycemia  . Tendonitis   . Thyroid disease   . Transfusion history    age 76 "anemia"    Past Surgical History:  Procedure Laterality Date  . CATARACT EXTRACTION, BILATERAL Bilateral   . CHOLECYSTECTOMY     open  . COLONOSCOPY WITH PROPOFOL N/A 03/23/2016   Procedure: COLONOSCOPY WITH PROPOFOL;  Surgeon: Garlan Fair, MD;  Location: [facility]OSCOPY;  Service:  Endoscopy;  Laterality: N/A;  . EYE SURGERY    . FOOT SURGERY Left    retained hardware  . KNEE SURGERY Left    scope   . TONSILLECTOMY      There were no vitals filed for this visit.      Subjective Assessment - 03/01/17 0931    Subjective doing well   Patient Stated Goals improve dizziness, improve balance   Currently in Pain? No/denies            [facility] PT Assessment - 03/01/17 0947      Observation/Other Assessments   Focus on Therapeutic Outcomes (FOTO)  66 (34% limited)   Dizziness Handicap Inventory (DHI)  28 (mild)     Ambulation/Gait   Ambulation/Gait Assistance 7: Independent   Ambulation Distance (Feet) 1035 Feet   Assistive device None   Gait Pattern Within Functional Limits   Ambulation Surface Level;Unlevel;Indoor   Gait velocity 2.34 ft/sec  14.02 sec   Gait Comments simulated outdoor surfaces due to weather     Functional Gait  Assessment   Gait assessed  Yes   Gait Level Surface Walks 20 ft in less than 7 sec but greater than 5.5 sec, uses assistive device, slower speed, mild gait deviations, or deviates 6-10 in outside of the 12 in walkway  width.   Change in Gait Speed Able to smoothly change walking speed without loss of balance or gait deviation. Deviate no more than 6 in outside of the 12 in walkway width.   Gait with Horizontal Head Turns Performs head turns smoothly with slight change in gait velocity (eg, minor disruption to smooth gait path), deviates 6-10 in outside 12 in walkway width, or uses an assistive device.   Gait with Vertical Head Turns Performs head turns with no change in gait. Deviates no more than 6 in outside 12 in walkway width.   Gait and Pivot Turn Pivot turns safely in greater than 3 sec and stops with no loss of balance, or pivot turns safely within 3 sec and stops with mild imbalance, requires small steps to catch balance.   Step Over Obstacle Is able to step over one shoe box (4.5 in total height) without changing gait  speed. No evidence of imbalance.   Gait with Narrow Base of Support Ambulates 7-9 steps.   Gait with Eyes Closed Walks 20 ft, uses assistive device, slower speed, mild gait deviations, deviates 6-10 in outside 12 in walkway width. Ambulates 20 ft in less than 9 sec but greater than 7 sec.   Ambulating Backwards Walks 20 ft, uses assistive device, slower speed, mild gait deviations, deviates 6-10 in outside 12 in walkway width.   Steps Alternating feet, must use rail.   Total Score 22                     [facility] Adult PT Treatment/Exercise - 03/01/17 0947      Self-Care   Self-Care Other Self-Care Comments   Other Self-Care Comments  reviewed HEP-pt independent                  PT Short Term Goals - 01/20/17 1629      PT SHORT TERM GOAL #1   Title STGs=LTGs           PT Long Term Goals - 03/01/17 1011      PT LONG TERM GOAL #1   Title independent with HEP (03/03/17)   Status Achieved     PT LONG TERM GOAL #2   Title improve DHI to < 30% for improved function (03/03/17)   Status Achieved     PT LONG TERM GOAL #3   Title improve FGA to >/= 18/30 for improved functional mobility and balance (03/03/17)   Status Achieved     PT LONG TERM GOAL #4   Title improve gait velocity to > 2.62 ft/sec for improved community access (03/03/17)   Baseline improved to 2.34 ft/sec   Status Not Met     PT LONG TERM GOAL #5   Title amb > 1000' on various indoor/outdoor surfaces independently for improved functional mobility (03/03/17)   Baseline met with simulated outdoor environment   Status Achieved               Plan - 03/01/17 1012    Clinical Impression Statement Pt has met most goals and is ready for d/c.  Gait velocity improved, but not to goal at this time.  Pt pleased with progress at this time and is ready for d/c and transition to community fitness.     PT Next Visit Plan d/c PT today   Consulted and Agree with Plan of Care Patient      Patient  will benefit from skilled therapeutic intervention in order to improve the following deficits  and impairments:  Abnormal gait, Decreased balance, Decreased mobility, Difficulty walking, Dizziness  Visit Diagnosis: Unsteadiness on feet  Dizziness and giddiness       G-Codes - 03-17-2017 1121    Functional Assessment Tool Used (Outpatient Only) DHI 28, FGA 22/30   Functional Limitation Mobility: Walking and moving around   Mobility: Walking and Moving Around Goal Status [phone]) At least 1 percent but less than 20 percent impaired, limited or restricted   Mobility: Walking and Moving Around Discharge Status [phone]) At least 20 percent but less than 40 percent impaired, limited or restricted      Problem List Patient Active Problem List   Diagnosis Date Noted  . Atherosclerotic peripheral vascular disease ([place])   . Essential hypertension, benign 11/22/2013  . Osteopenia 11/22/2013  . Labial fusion      Laureen Abrahams, PT, DPT 03-17-2017 11:23 AM   [facility] [address] [place], [place], [address] Phone: [phone]   Fax:  [phone]  Name: Christina Sellers MRN: [id number] Date of Birth: 1940-12-12         PHYSICAL THERAPY DISCHARGE SUMMARY  Visits from Start of Care: 9  Current functional level related to goals / functional outcomes: See above   Remaining deficits: Pt overall doing very well with improved balance and symptoms.  Continues to be fearful and recommended she walk with someone to help with confidence   Education / Equipment: HEP  Plan: Patient agrees to discharge.  Patient goals were partially met. Patient is being discharged due to meeting the stated rehab goals.  ?????    Laureen Abrahams, PT, DPT 03/17/17 11:25 AM  [facility] [address] [place], [place] [address]  [phone] (office) [phone] (fax)

## 2017-04-14 ENCOUNTER — Encounter: Payer: Self-pay | Admitting: Gynecology

## 2017-04-14 ENCOUNTER — Ambulatory Visit (INDEPENDENT_AMBULATORY_CARE_PROVIDER_SITE_OTHER): Payer: Medicare Other | Admitting: Gynecology

## 2017-04-14 VITALS — BP 146/90 | Ht 59.0 in | Wt 170.0 lb

## 2017-04-14 DIAGNOSIS — N3001 Acute cystitis with hematuria: Secondary | ICD-10-CM | POA: Diagnosis not present

## 2017-04-14 DIAGNOSIS — N898 Other specified noninflammatory disorders of vagina: Secondary | ICD-10-CM | POA: Diagnosis not present

## 2017-04-14 DIAGNOSIS — R300 Dysuria: Secondary | ICD-10-CM | POA: Diagnosis not present

## 2017-04-14 DIAGNOSIS — N9089 Other specified noninflammatory disorders of vulva and perineum: Secondary | ICD-10-CM

## 2017-04-14 LAB — URINALYSIS W MICROSCOPIC + REFLEX CULTURE
BILIRUBIN URINE: NEGATIVE
Casts: NONE SEEN [LPF]
Crystals: NONE SEEN [HPF]
GLUCOSE, UA: NEGATIVE
Ketones, ur: NEGATIVE
NITRITE: NEGATIVE
SPECIFIC GRAVITY, URINE: 1.015 (ref 1.001–1.035)
Yeast: NONE SEEN [HPF]
pH: 5.5 (ref 5.0–8.0)

## 2017-04-14 LAB — WET PREP FOR TRICH, YEAST, CLUE
CLUE CELLS WET PREP: NONE SEEN
Trich, Wet Prep: NONE SEEN
WBC WET PREP: NONE SEEN
YEAST WET PREP: NONE SEEN

## 2017-04-14 MED ORDER — CLOBETASOL PROPIONATE 0.05 % EX CREA: TOPICAL_CREAM | CUTANEOUS | 3 refills | Status: DC

## 2017-04-14 MED ORDER — PHENAZOPYRIDINE HCL 97.2 MG PO TABS: 97.0000 mg | ORAL_TABLET | Freq: Three times a day (TID) | ORAL | 0 refills | Status: DC | PRN

## 2017-04-14 MED ORDER — NITROFURANTOIN MONOHYD MACRO 100 MG PO CAPS: 100.0000 mg | ORAL_CAPSULE | Freq: Two times a day (BID) | ORAL | 0 refills | Status: DC

## 2017-04-14 NOTE — Progress Notes (Signed)
   Patient is a 76 year old that presented to the office complaining the past few days of dysuria and some frequency no back pain and no fever no chills no nausea no vomiting or GI complaints. Patient also had noted some brownish discharge and also very irritated on her external genitalia. Patient is on no hormone replacement therapy.  Exam: Pelvic: External genitalia and medial thighs erythematous probably from urinary leakage patient states she has to wear a pad at times. Pelvic: Very narrow vagina and difficult to place a speculum due to the irritation discomfort for the patient was experiencing Bimanual exam no palpable masses or tenderness. Discomfort in the vagina. Rectal exam not done  Wet prep just few bacteria  Urinalysis moderate bacteria, 10-20 RBC, 20-40 WBC  Assessment/plan: #1 patient with excoriated erythematous external genitalia probably from urinary leakage. She is going to be place on clobetasol cream 0.05% to apply a thin film twice a day for the first week, second week to apply once a day at bedtime and the third week and thereafter for a few weeks to apply twice a week. She will then return to the office to complete evaluation in the event that we may need to do an endometrial biopsy. #2 patient with clinical evidence of urinary tract infection will be treated with Macrobid one by mouth twice a day for 7 days and Pyridium 97 mg 3 times a day for the next 3 days.

## 2017-04-14 NOTE — Addendum Note (Signed)
Addended by: Burnett Kanaris on: 04/14/2017 04:26 PM   Modules accepted: Orders

## 2017-04-14 NOTE — Patient Instructions (Signed)
Clobetasol Propionate skin cream What is this medicine? CLOBETASOL (kloe BAY ta sol) is a corticosteroid. It is used on the skin to treat itching, redness, and swelling caused by some skin conditions. This medicine may be used for other purposes; ask your health care provider or pharmacist if you have questions. COMMON BRAND NAME(S): Cormax, Embeline, Embeline E, Impoyz, Temovate, Temovate E What should I tell my health care provider before I take this medicine? They need to know if you have any of these conditions: -any type of active infection including measles, tuberculosis, herpes, or chickenpox -circulation problems or vascular disease -large areas of burned or damaged skin -rosacea -skin wasting or thinning -an unusual or allergic reaction to clobetasol, corticosteroids, other medicines, foods, dyes, or preservatives -pregnant or trying to get pregnant -breast-feeding How should I use this medicine? This medicine is for external use only. Do not take by mouth. Follow the directions on the prescription label. Wash your hands before and after use. Apply a thin film of medicine to the affected area. Do not cover with a bandage or dressing unless your doctor or health care professional tells you to. Do not get this medicine in your eyes. If you do, rinse out with plenty of cool tap water. It is important not to use more medicine than prescribed. Do not use your medicine more often than directed. To do so may increase the chance of side effects. Talk to your pediatrician regarding the use of this medicine in children. Special care may be needed. Elderly patients are more likely to have damaged skin through aging, and this may increase side effects. This medicine should only be used for brief periods and infrequently in older patients. Overdosage: If you think you have taken too much of this medicine contact a poison control center or emergency room at once. NOTE: This medicine is only for you.  Do not share this medicine with others. What if I miss a dose? If you miss a dose, use it as soon as you can. If it is almost time for your next dose, use only that dose. Do not use double or extra doses. What may interact with this medicine? Interactions are not expected. Do not use cosmetics or other skin care products on the treated area. This list may not describe all possible interactions. Give your health care provider a list of all the medicines, herbs, non-prescription drugs, or dietary supplements you use. Also tell them if you smoke, drink alcohol, or use illegal drugs. Some items may interact with your medicine. What should I watch for while using this medicine? Tell your doctor or health care professional if your symptoms do not get better within 2 weeks, or if you develop skin irritation from the medicine. Tell your doctor or health care professional if you are exposed to anyone with measles or chickenpox, or if you develop sores or blisters that do not heal properly. What side effects may I notice from receiving this medicine? Side effects that you should report to your doctor or health care professional as soon as possible: -allergic reactions like skin rash, itching or hives, swelling of the face, lips, or tongue -changes in vision -lack of healing of the skin condition -painful, red, pus filled blisters on the skin or in hair follicles -thinning of the skin with easy bruising Side effects that usually do not require medical attention (report to your doctor or health care professional if they continue or are bothersome): -burning, irritation of the skin -redness  or scaling of the skin This list may not describe all possible side effects. Call your doctor for medical advice about side effects. You may report side effects to FDA at 1-800-FDA-1088. Where should I keep my medicine? Keep out of the reach of children. Store at room temperature between 15 and 30 degrees C (59 and 86  degrees F). Keep away from heat and direct light. Do not freeze. Throw away any unused medicine after the expiration date. NOTE: This sheet is a summary. It may not cover all possible information. If you have questions about this medicine, talk to your doctor, pharmacist, or health care provider.  2018 Elsevier/Gold Standard (2008-02-28 16:56:45) Nitrofurantoin tablets or capsules What is this medicine? NITROFURANTOIN (nye troe fyoor AN toyn) is an antibiotic. It is used to treat urinary tract infections. This medicine may be used for other purposes; ask your health care provider or pharmacist if you have questions. COMMON BRAND NAME(S): Macrobid, Macrodantin, Urotoin What should I tell my health care provider before I take this medicine? They need to know if you have any of these conditions: -anemia -diabetes -glucose-6-phosphate dehydrogenase deficiency -kidney disease -liver disease -lung disease -other chronic illness -an unusual or allergic reaction to nitrofurantoin, other antibiotics, other medicines, foods, dyes or preservatives -pregnant or trying to get pregnant -breast-feeding How should I use this medicine? Take this medicine by mouth with a glass of water. Follow the directions on the prescription label. Take this medicine with food or milk. Take your doses at regular intervals. Do not take your medicine more often than directed. Do not stop taking except on your doctor's advice. Talk to your pediatrician regarding the use of this medicine in children. While this drug may be prescribed for selected conditions, precautions do apply. Overdosage: If you think you have taken too much of this medicine contact a poison control center or emergency room at once. NOTE: This medicine is only for you. Do not share this medicine with others. What if I miss a dose? If you miss a dose, take it as soon as you can. If it is almost time for your next dose, take only that dose. Do not take  double or extra doses. What may interact with this medicine? -antacids containing magnesium trisilicate -probenecid -quinolone antibiotics like ciprofloxacin, lomefloxacin, norfloxacin and ofloxacin -sulfinpyrazone This list may not describe all possible interactions. Give your health care provider a list of all the medicines, herbs, non-prescription drugs, or dietary supplements you use. Also tell them if you smoke, drink alcohol, or use illegal drugs. Some items may interact with your medicine. What should I watch for while using this medicine? Tell your doctor or health care professional if your symptoms do not improve or if you get new symptoms. Drink several glasses of water a day. If you are taking this medicine for a long time, visit your doctor for regular checks on your progress. If you are diabetic, you may get a false positive result for sugar in your urine with certain brands of urine tests. Check with your doctor. What side effects may I notice from receiving this medicine? Side effects that you should report to your doctor or health care professional as soon as possible: -allergic reactions like skin rash or hives, swelling of the face, lips, or tongue -chest pain -cough -difficulty breathing -dizziness, drowsiness -fever or infection -joint aches or pains -pale or blue-tinted skin -redness, blistering, peeling or loosening of the skin, including inside the mouth -tingling, burning, pain, or numbness  in hands or feet -unusual bleeding or bruising -unusually weak or tired -yellowing of eyes or skin Side effects that usually do not require medical attention (report to your doctor or health care professional if they continue or are bothersome): -dark urine -diarrhea -headache -loss of appetite -nausea or vomiting -temporary hair loss This list may not describe all possible side effects. Call your doctor for medical advice about side effects. You may report side effects to  FDA at 1-800-FDA-1088. Where should I keep my medicine? Keep out of the reach of children. Store at room temperature between 15 and 30 degrees C (59 and 86 degrees F). Protect from light. Throw away any unused medicine after the expiration date. NOTE: This sheet is a summary. It may not cover all possible information. If you have questions about this medicine, talk to your doctor, pharmacist, or health care provider.  2018 Elsevier/Gold Standard (2008-06-12 15:56:47) Urinary Tract Infection, Adult A urinary tract infection (UTI) is an infection of any part of the urinary tract, which includes the kidneys, ureters, bladder, and urethra. These organs make, store, and get rid of urine in the body. UTI can be a bladder infection (cystitis) or kidney infection (pyelonephritis). What are the causes? This infection may be caused by fungi, viruses, or bacteria. Bacteria are the most common cause of UTIs. This condition can also be caused by repeated incomplete emptying of the bladder during urination. What increases the risk? This condition is more likely to develop if:  You ignore your need to urinate or hold urine for long periods of time.  You do not empty your bladder completely during urination.  You wipe back to front after urinating or having a bowel movement, if you are female.  You are uncircumcised, if you are female.  You are constipated.  You have a urinary catheter that stays in place (indwelling).  You have a weak defense (immune) system.  You have a medical condition that affects your bowels, kidneys, or bladder.  You have diabetes.  You take antibiotic medicines frequently or for long periods of time, and the antibiotics no longer work well against certain types of infections (antibiotic resistance).  You take medicines that irritate your urinary tract.  You are exposed to chemicals that irritate your urinary tract.  You are female. What are the signs or  symptoms? Symptoms of this condition include:  Fever.  Frequent urination or passing small amounts of urine frequently.  Needing to urinate urgently.  Pain or burning with urination.  Urine that smells bad or unusual.  Cloudy urine.  Pain in the lower abdomen or back.  Trouble urinating.  Blood in the urine.  Vomiting or being less hungry than normal.  Diarrhea or abdominal pain.  Vaginal discharge, if you are female. How is this diagnosed? This condition is diagnosed with a medical history and physical exam. You will also need to provide a urine sample to test your urine. Other tests may be done, including:  Blood tests.  Sexually transmitted disease (STD) testing. If you have had more than one UTI, a cystoscopy or imaging studies may be done to determine the cause of the infections. How is this treated? Treatment for this condition often includes a combination of two or more of the following:  Antibiotic medicine.  Other medicines to treat less common causes of UTI.  Over-the-counter medicines to treat pain.  Drinking enough water to stay hydrated. Follow these instructions at home:  Take over-the-counter and prescription medicines only as  told by your health care provider.  If you were prescribed an antibiotic, take it as told by your health care provider. Do not stop taking the antibiotic even if you start to feel better.  Avoid alcohol, caffeine, tea, and carbonated beverages. They can irritate your bladder.  Drink enough fluid to keep your urine clear or pale yellow.  Keep all follow-up visits as told by your health care provider. This is important.  Make sure to:  Empty your bladder often and completely. Do not hold urine for long periods of time.  Empty your bladder before and after sex.  Wipe from front to back after a bowel movement if you are female. Use each tissue one time when you wipe. Contact a health care provider if:  You have back  pain.  You have a fever.  You feel nauseous or vomit.  Your symptoms do not get better after 3 days.  Your symptoms go away and then return. Get help right away if:  You have severe back pain or lower abdominal pain.  You are vomiting and cannot keep down any medicines or water. This information is not intended to replace advice given to you by your health care provider. Make sure you discuss any questions you have with your health care provider. Document Released: 09/01/2005 Document Revised: 05/05/2016 Document Reviewed: 10/13/2015 Elsevier Interactive Patient Education  2017 [employer].

## 2017-04-15 LAB — URINE CULTURE

## 2017-04-19 ENCOUNTER — Telehealth: Payer: Self-pay | Admitting: *Deleted

## 2017-04-19 MED ORDER — AMPICILLIN 500 MG PO CAPS: 500.0000 mg | ORAL_CAPSULE | Freq: Two times a day (BID) | ORAL | 0 refills | Status: DC

## 2017-04-19 NOTE — Telephone Encounter (Signed)
Pt aware to stop Macrobid and take ampicillin.

## 2017-04-19 NOTE — Telephone Encounter (Signed)
Call in to her pharmacy ampicillin 250 mg one by mouth 3 times a day for 7 days and stop the Macrobid. If they don't have the 250 mg you  can order 500 mg and she can breaking in half that would be the equivalent to 250 mg. Call her and Pyridium 200 mg one by mouth 3 times a day for 3 more days

## 2017-04-19 NOTE — Telephone Encounter (Signed)
Dr.Fernandez the pharmacy does have the ampicillin 500 mg capsule, however the pharmacist said that capsule must be taking whole, they are not scored because they are capsules. Please advise

## 2017-04-19 NOTE — Telephone Encounter (Signed)
[place] 500 mg ampicillin twice a day for 7 days

## 2017-04-19 NOTE — Telephone Encounter (Signed)
Pt called to follow up on urine culture from [facility] 04/14/17 was prescribed Macrobid one by mouth twice a day for 7 days and Pyridium 97 mg 3 times a day for the next 3 days, pt still has pressure and light burning with urination. Asked if she is on correct medication? Pt still has 2 days left on Rx.  Please advise

## 2017-04-20 ENCOUNTER — Encounter: Payer: Self-pay | Admitting: Gynecology

## 2017-04-27 ENCOUNTER — Encounter: Payer: Self-pay | Admitting: Women's Health

## 2017-04-27 ENCOUNTER — Ambulatory Visit (INDEPENDENT_AMBULATORY_CARE_PROVIDER_SITE_OTHER): Payer: Medicare Other | Admitting: Women's Health

## 2017-04-27 ENCOUNTER — Other Ambulatory Visit: Payer: Self-pay | Admitting: Women's Health

## 2017-04-27 VITALS — BP 132/80 | Ht 59.0 in | Wt 170.0 lb

## 2017-04-27 DIAGNOSIS — N898 Other specified noninflammatory disorders of vagina: Secondary | ICD-10-CM

## 2017-04-27 DIAGNOSIS — R300 Dysuria: Secondary | ICD-10-CM | POA: Diagnosis not present

## 2017-04-27 LAB — WET PREP FOR TRICH, YEAST, CLUE
Clue Cells Wet Prep HPF POC: NONE SEEN
TRICH WET PREP: NONE SEEN
WBC, Wet Prep HPF POC: NONE SEEN
YEAST WET PREP: NONE SEEN

## 2017-04-27 LAB — URINALYSIS W MICROSCOPIC + REFLEX CULTURE
BILIRUBIN URINE: NEGATIVE
Casts: NONE SEEN [LPF]
Crystals: NONE SEEN [HPF]
Glucose, UA: NEGATIVE
Hgb urine dipstick: NEGATIVE
Ketones, ur: NEGATIVE
LEUKOCYTES UA: NEGATIVE
NITRITE: NEGATIVE
PH: 6 (ref 5.0–8.0)
RBC / HPF: NONE SEEN RBC/HPF (ref <=2)
SPECIFIC GRAVITY, URINE: 1.015 (ref 1.001–1.035)
Yeast: NONE SEEN [HPF]

## 2017-04-27 NOTE — Progress Notes (Signed)
Presents with several issues. Was treated for UTI 2 weeks ago with Macrobid,  completed the medication. Was started on ampicillin due to urine culture results, was only able to take 6 doses due to severe stomach pain. Denies vaginal discharge but is having some vaginal irritation which is better, relates to Temovate. Has slight burning discomfort at initiation of urination, but denies pain at end of stream, change in frequency. Does have mild stress urinary incontinence does wear a mini pad daily. History of IBS and low abdominal discomfort/pressure which is common for her. Denies a fever. Not sexually active. Postmenopausal on no HRT with no bleeding. History of labial fusion .  Exam: Appears comfortable, some difficulty  with mobility, getting on and off table, needs help. No CVAT, abdomen obese, no rebound or radiation of pain soft. External genitalia labial fusion, wet prep done with a Q-tip, wet prep negative. Mild erythema possibly from mild incontinence. UA: Trace protein, negative leukocytes, 0-5 WBCs, no RBCs, 10-20 squamous epithelials, few bacteria  Resolving UTI  Plan: Urine culture pending. Reviewed normality of wet prep and UA results. Will continue small amount of Temovate externally 2-3 times weekly, refill prescription given.

## 2017-04-30 LAB — URINE CULTURE

## 2017-05-03 ENCOUNTER — Other Ambulatory Visit: Payer: Self-pay | Admitting: Women's Health

## 2017-05-03 DIAGNOSIS — R8271 Bacteriuria: Secondary | ICD-10-CM

## 2017-05-03 MED ORDER — AMOXICILLIN-POT CLAVULANATE 500-125 MG PO TABS: 1.0000 | ORAL_TABLET | Freq: Three times a day (TID) | ORAL | 0 refills | Status: DC

## 2017-05-11 ENCOUNTER — Other Ambulatory Visit: Payer: Medicare Other

## 2017-05-11 DIAGNOSIS — R8271 Bacteriuria: Secondary | ICD-10-CM

## 2017-05-12 LAB — URINALYSIS W MICROSCOPIC + REFLEX CULTURE
BILIRUBIN URINE: NEGATIVE
Bacteria, UA: NONE SEEN [HPF]
Casts: NONE SEEN [LPF]
Crystals: NONE SEEN [HPF]
GLUCOSE, UA: NEGATIVE
Hgb urine dipstick: NEGATIVE
Ketones, ur: NEGATIVE
LEUKOCYTES UA: NEGATIVE
Nitrite: NEGATIVE
Protein, ur: NEGATIVE
RBC / HPF: NONE SEEN RBC/HPF (ref <=2)
SPECIFIC GRAVITY, URINE: 1.015 (ref 1.001–1.035)
WBC UA: NONE SEEN WBC/HPF (ref <=5)
Yeast: NONE SEEN [HPF]
pH: 7 (ref 5.0–8.0)

## 2017-06-09 ENCOUNTER — Other Ambulatory Visit (HOSPITAL_COMMUNITY): Payer: Self-pay | Admitting: Internal Medicine

## 2017-06-09 DIAGNOSIS — I10 Essential (primary) hypertension: Secondary | ICD-10-CM

## 2017-06-23 ENCOUNTER — Ambulatory Visit (HOSPITAL_COMMUNITY)
Admission: RE | Admit: 2017-06-23 | Discharge: 2017-06-23 | Disposition: A | Discharge status: Home / Self Care | Payer: Medicare Other | Source: Ambulatory Visit | Attending: Internal Medicine | Admitting: Internal Medicine

## 2017-06-23 DIAGNOSIS — I10 Essential (primary) hypertension: Secondary | ICD-10-CM | POA: Diagnosis not present

## 2017-06-23 DIAGNOSIS — I420 Dilated cardiomyopathy: Secondary | ICD-10-CM | POA: Diagnosis not present

## 2017-06-23 DIAGNOSIS — R9431 Abnormal electrocardiogram [ECG] [EKG]: Secondary | ICD-10-CM | POA: Diagnosis present

## 2017-06-23 DIAGNOSIS — I059 Rheumatic mitral valve disease, unspecified: Secondary | ICD-10-CM | POA: Diagnosis not present

## 2017-06-23 DIAGNOSIS — I5030 Unspecified diastolic (congestive) heart failure: Secondary | ICD-10-CM | POA: Diagnosis not present

## 2017-06-23 NOTE — Progress Notes (Signed)
  Echocardiogram 2D Echocardiogram has been performed.  Jennette Dubin 06/23/2017, 2:02 PM

## 2017-10-05 ENCOUNTER — Encounter: Payer: Self-pay | Admitting: Podiatry

## 2017-10-05 ENCOUNTER — Ambulatory Visit (INDEPENDENT_AMBULATORY_CARE_PROVIDER_SITE_OTHER): Payer: Medicare Other | Admitting: Podiatry

## 2017-10-05 ENCOUNTER — Other Ambulatory Visit: Payer: Self-pay | Admitting: Podiatry

## 2017-10-05 ENCOUNTER — Ambulatory Visit (INDEPENDENT_AMBULATORY_CARE_PROVIDER_SITE_OTHER): Payer: Medicare Other

## 2017-10-05 DIAGNOSIS — M79671 Pain in right foot: Secondary | ICD-10-CM

## 2017-10-05 DIAGNOSIS — M2040 Other hammer toe(s) (acquired), unspecified foot: Secondary | ICD-10-CM | POA: Diagnosis not present

## 2017-10-05 DIAGNOSIS — M79672 Pain in left foot: Secondary | ICD-10-CM

## 2017-10-06 NOTE — Progress Notes (Signed)
Subjective:    Patient ID: Christina Sellers, female   DOB: 76 y.o.   MRN: [id number]   HPI patient presents after having had surgery with pain underneath the second joint stating that it's very hard for her to walk on and it's getting gradually worse    ROS      Objective:  Physical Exam neurovascular status intact with patient found to have significant discomfort in the second MPJ left with fluid buildup around the joint surface. Patient's found have good digital perfusion she's well oriented 3 and has numerous scars the left foot were bunion was corrected that was under corrected     Assessment:    Inflammatory capsulitis second MPJ left with previous surgery which was not successful for resolution of symptoms     Plan:    H&P x-rays reviewed and recommended a new orthotic with high cushioned coefficient to try to take pressure off the joint surface. Patient is scheduled for this to be done and we reviewed her x-rays and I do not recommend current further surgery  X-rays indicate numerous screws in the first metatarsal left left hallux left second metatarsal and left second digit

## 2017-10-19 ENCOUNTER — Ambulatory Visit (INDEPENDENT_AMBULATORY_CARE_PROVIDER_SITE_OTHER): Payer: Medicare Other | Admitting: Women's Health

## 2017-10-19 ENCOUNTER — Encounter: Payer: Self-pay | Admitting: Women's Health

## 2017-10-19 VITALS — BP 140/80

## 2017-10-19 DIAGNOSIS — R300 Dysuria: Secondary | ICD-10-CM

## 2017-10-19 MED ORDER — NITROFURANTOIN MONOHYD MACRO 100 MG PO CAPS
100.0000 mg | ORAL_CAPSULE | Freq: Two times a day (BID) | ORAL | 0 refills | Status: AC
Start: 2017-10-19 — End: 2017-10-26

## 2017-10-19 NOTE — Progress Notes (Signed)
76 yo female presents with complaints of dysuria x 3 days. Burning occurs primarily at the end of urination, foul-smelling urine. History of labial fusion, wears mini pad daily for dribbling which has worsened over the past few days, causing vaginal irritation. Denies vaginal discharge, vaginal pruritus, abdominal pain or fever. Has been applying A and D ointment and Temovate cream to the vaginal area and has noticed improvement. Not sexually active. History includes hypertension, osteopenia, chronic low back and left foot pain.  Exam: Appears well. No CVAT. Abdomen soft and nontender. External genitalia labial fusion with surrounding erythema. UA: Negative blood, 1+ protein, negative nitrites, 2+ leukocyte esterase, 20-40 WBCs, 20-40 squamous epithelial cells, many bacteria  Recurrent UTI Vaginal irritation, labial fusion  Plan: Macrobid 100 mg twice a day 10 days. Reviewed if symptoms are resolved after 7 days of Macrobid instructed to stop. Instructed to take with food. (Allergies to Septra, Cipro)  Refill prescription given for estradiol .02% cream to be applied to vaginal area 2-3 times weekly. Urine culture pending. Call or return to office if symptoms worsen or do not improve.

## 2017-10-19 NOTE — Patient Instructions (Signed)

## 2017-10-21 LAB — URINALYSIS W MICROSCOPIC + REFLEX CULTURE
Bilirubin Urine: NEGATIVE
Glucose, UA: NEGATIVE
HGB URINE DIPSTICK: NEGATIVE
HYALINE CAST: NONE SEEN /LPF
NITRITES URINE, INITIAL: NEGATIVE
RBC / HPF: NONE SEEN /HPF (ref 0–2)
Specific Gravity, Urine: 1.015 (ref 1.001–1.03)
pH: 6 (ref 5.0–8.0)

## 2017-10-21 LAB — URINE CULTURE
MICRO NUMBER: 81289419
SPECIMEN QUALITY:: ADEQUATE

## 2017-10-21 LAB — CULTURE INDICATED

## 2017-10-26 ENCOUNTER — Encounter: Payer: Medicare Other | Admitting: Orthotics

## 2017-11-02 ENCOUNTER — Encounter: Payer: Medicare Other | Admitting: Orthotics

## 2017-11-03 ENCOUNTER — Telehealth: Payer: Self-pay | Admitting: *Deleted

## 2017-11-03 ENCOUNTER — Other Ambulatory Visit: Payer: Self-pay

## 2017-11-03 MED ORDER — AMOXICILLIN-POT CLAVULANATE 250-125 MG PO TABS: 1.0000 | ORAL_TABLET | Freq: Two times a day (BID) | ORAL | 0 refills | Status: DC

## 2017-11-03 MED ORDER — AMOXICILLIN-POT CLAVULANATE 500-125 MG PO TABS
1.0000 | ORAL_TABLET | Freq: Two times a day (BID) | ORAL | 0 refills | Status: DC
Start: 2017-11-03 — End: 2018-02-14

## 2017-11-03 NOTE — Telephone Encounter (Signed)
Rx sent. Called Pharmacy and left message to cancel 250mg  one and new Rx being sent electronically.

## 2017-11-03 NOTE — Telephone Encounter (Signed)
Patient informed, Rx sent.  

## 2017-11-03 NOTE — Telephone Encounter (Signed)
Recommend Augmentin 250 mg twice daily times 7 days

## 2017-11-03 NOTE — Telephone Encounter (Signed)
Okay for Augmentin 500 twice daily times 3 days

## 2017-11-03 NOTE — Telephone Encounter (Signed)
Dr.Fontaine [name] (pharmacist at [facility]) called stating Augmentin 250mg  is no longer made, they only have 500 mg dose. She said you can't half the 500 mg dose either as it won't have the same effect as the 250 mg dose. 2 option increase to 500 mg dose or she can have 250 liquid dose which will taste bad (per pharmacist). Please advise

## 2017-11-03 NOTE — Telephone Encounter (Signed)
You are back up md) patient was prescribed Macrobid 100 mg x 7 days completed Rx states she still has pressure feeling, no burning with urination, slight urine odor, no frequent urination. Patient has vertigo and not able to come in. States her PCP told her the Macrobid could have been cause of the vertigo.  Can't take septra or Cipro. Please advise

## 2017-11-16 ENCOUNTER — Encounter: Payer: Medicare Other | Admitting: Orthotics

## 2017-12-08 ENCOUNTER — Telehealth: Payer: Self-pay | Admitting: *Deleted

## 2017-12-08 NOTE — Telephone Encounter (Signed)
Patient called requesting a dexa order faxed to [facility] for dexa on 01/18/18. Order faxed

## 2017-12-26 ENCOUNTER — Ambulatory Visit (INDEPENDENT_AMBULATORY_CARE_PROVIDER_SITE_OTHER): Payer: Medicare Other | Admitting: Orthotics

## 2017-12-26 DIAGNOSIS — M79672 Pain in left foot: Principal | ICD-10-CM

## 2017-12-26 DIAGNOSIS — M79671 Pain in right foot: Secondary | ICD-10-CM

## 2017-12-26 DIAGNOSIS — M79676 Pain in unspecified toe(s): Secondary | ICD-10-CM

## 2017-12-26 NOTE — Progress Notes (Signed)
Patient came in today to pick up custom made foot orthotics.  The goals were accomplished and the patient reported no dissatisfaction with said orthotics.   However, I did have to reduce bulk of orthotics in order for them to fit into her [place] shoes, Patient was advised of breakin period and how to report any issues.

## 2017-12-27 ENCOUNTER — Encounter: Payer: Self-pay | Admitting: Podiatry

## 2017-12-27 NOTE — Progress Notes (Signed)
Requested radiology images were put up front to be mailed out today to patient.

## 2018-01-31 ENCOUNTER — Encounter: Payer: Self-pay | Admitting: Women's Health

## 2018-02-14 ENCOUNTER — Encounter: Payer: Medicare Other | Admitting: Women's Health

## 2018-02-14 ENCOUNTER — Ambulatory Visit (INDEPENDENT_AMBULATORY_CARE_PROVIDER_SITE_OTHER): Payer: Medicare Other | Admitting: Women's Health

## 2018-02-14 ENCOUNTER — Encounter: Payer: Self-pay | Admitting: Women's Health

## 2018-02-14 VITALS — BP 148/78 | Ht 59.0 in | Wt 169.0 lb

## 2018-02-14 DIAGNOSIS — Z01419 Encounter for gynecological examination (general) (routine) without abnormal findings: Secondary | ICD-10-CM

## 2018-02-14 MED ORDER — BETAMETHASONE VALERATE 0.1 % EX OINT: 1.0000 "application " | TOPICAL_OINTMENT | Freq: Two times a day (BID) | CUTANEOUS | 0 refills | Status: DC

## 2018-02-14 MED ORDER — CLOBETASOL PROPIONATE 0.05 % EX CREA: TOPICAL_CREAM | CUTANEOUS | 3 refills | Status: DC

## 2018-02-14 NOTE — Progress Notes (Signed)
Christina Sellers 11-01-1941 [id number]    History:    Presents for breast and pelvic exam. Postmenopausal on no HRT with no bleeding. Normal Pap and mammogram history. Labial fusion without difficulty with urination. Widowed not sexually active in years. 03/2016 colonoscopy diverticulosis. 2017 osteopenia with elevated FRAX but declined medication. Last DEXA 2019 T score-1.8 at hips stable, -2.6 at radius. Vaccines current. Continues to have chronic pain with left foot, low back and hip pain.rimary care manages hypothyroidism.  Past medical history, past surgical history, family history and social history were all reviewed and documented in the EPIC chart. practicing chair yoga. 3 children all doing well.  ROS:  A ROS was performed and pertinent positives and negatives are included.  Exam:  Vitals:   02/14/18 1110  BP: (!) 148/78  Weight: 169 lb (76.7 kg)  Height: 4\' 11"  (1.499 m)   Body mass index is 34.13 kg/m.   General appearance:  Normal Thyroid:  Symmetrical, normal in size, without palpable masses or nodularity. Respiratory  Auscultation:  Clear without wheezing or rhonchi Cardiovascular  Auscultation:  Regular rate, without rubs, murmurs or gallops  Edema/varicosities:  Not grossly evident Abdominal  Soft,nontender, without masses, guarding or rebound.  Liver/spleen:  No organomegaly noted  Hernia:  None appreciated  Skin  Inspection:  Grossly normal   Breasts: Examined lying and sitting.     Right: Without masses, retractions, discharge or axillary adenopathy.     Left: Without masses, retractions, discharge or axillary adenopathy. Gentitourinary   Inguinal/mons:  Normal without inguinal adenopathy  External genitalia:  Labial fusion  BUS/Urethra/Skene's glands:  Normal  Vagina:  stenotic  Cervix:  Normal  Uterus:   normal in size, shape and contour.  Midline and mobile  Adnexa/parametria:     Rt: Without masses or tenderness.   Lt: Without masses or  tenderness.  Anus and perineum: Normal  Digital rectal exam: Normal sphincter tone without palpated masses or tenderness  Assessment/Plan:  77 y.o. [facility] G2P2  for breast and pelvic exam.   Labial fusion Hypothyroid/hypercholesterolemia-primary care manages labs and meds Osteoporosis wrist, hips T score stable Chronic  pain hip, foot and back-orthopedist  Plan: reviewed blood pressure slightly elevated will recheck at home, history of hypertension no longer on medication. Had problems with vertigo. SBE's, continue annual screening mammogram, calcium rich foods, vitamin D 2000 daily. Home safety, fall prevention and importance of balance type exercise reviewed. Will continue yoga, light weights. Bisphosphonates reviewed, declines. Valisone cream to left inner thigh, pannus, keep open to air best as able. Follow-up with dermatologist if no relief.     Huel Cote [facility], 2:08 PM 02/14/2018

## 2018-02-14 NOTE — Patient Instructions (Signed)
shingrex  Vaccine for shingles Osteoporosis Osteoporosis happens when your bones become thinner and weaker. Weak bones can break (fracture) more easily when you slip or fall. Bones most at risk of breaking are in the hip, wrist, and spine. Follow these instructions at home:  Get enough calcium and vitamin D. These nutrients are good for your bones.  Exercise as told by your doctor.  Do not use any tobacco products. This includes cigarettes, chewing tobacco, and electronic cigarettes. If you need help quitting, ask your doctor.  Limit the amount of alcohol you drink.  Take medicines only as told by your doctor.  Keep all follow-up visits as told by your doctor. This is important.  Take care at home to prevent falls. Some ways to do this are: ? Keep rooms well lit and tidy. ? Put safety rails on your stairs. ? Put a rubber mat in the bathroom and other places that are often wet or slippery. Get help right away if:  You fall.  You hurt yourself. This information is not intended to replace advice given to you by your health care provider. Make sure you discuss any questions you have with your health care provider. Document Released: 02/14/2012 Document Revised: 04/29/2016 Document Reviewed: 05/02/2014 Elsevier Interactive Patient Education  [employer].

## 2018-02-15 ENCOUNTER — Encounter: Payer: Medicare Other | Admitting: Women's Health

## 2018-02-18 ENCOUNTER — Encounter: Payer: Self-pay | Admitting: Women's Health

## 2018-04-05 ENCOUNTER — Ambulatory Visit
Admission: RE | Admit: 2018-04-05 | Discharge: 2018-04-05 | Disposition: A | Discharge status: Home / Self Care | Payer: Medicare Other | Source: Ambulatory Visit | Attending: Internal Medicine | Admitting: Internal Medicine

## 2018-04-05 ENCOUNTER — Other Ambulatory Visit: Payer: Self-pay | Admitting: Internal Medicine

## 2018-04-05 DIAGNOSIS — R0609 Other forms of dyspnea: Principal | ICD-10-CM

## 2018-04-11 ENCOUNTER — Other Ambulatory Visit (HOSPITAL_COMMUNITY): Payer: Self-pay | Admitting: Internal Medicine

## 2018-04-11 ENCOUNTER — Other Ambulatory Visit (HOSPITAL_COMMUNITY): Payer: Self-pay | Admitting: Respiratory Therapy

## 2018-04-11 DIAGNOSIS — R0609 Other forms of dyspnea: Principal | ICD-10-CM

## 2018-04-12 ENCOUNTER — Telehealth (HOSPITAL_COMMUNITY): Payer: Self-pay

## 2018-04-12 NOTE — Telephone Encounter (Signed)
Encounter complete. 

## 2018-04-13 ENCOUNTER — Telehealth (HOSPITAL_COMMUNITY): Payer: Self-pay

## 2018-04-13 NOTE — Telephone Encounter (Signed)
Encounter complete. 

## 2018-04-14 ENCOUNTER — Ambulatory Visit (HOSPITAL_COMMUNITY)
Admission: RE | Admit: 2018-04-14 | Discharge: 2018-04-14 | Disposition: A | Discharge status: Home / Self Care | Payer: Medicare Other | Source: Ambulatory Visit | Attending: Cardiology | Admitting: Cardiology

## 2018-04-14 DIAGNOSIS — F329 Major depressive disorder, single episode, unspecified: Secondary | ICD-10-CM | POA: Insufficient documentation

## 2018-04-14 DIAGNOSIS — R079 Chest pain, unspecified: Secondary | ICD-10-CM | POA: Diagnosis not present

## 2018-04-14 DIAGNOSIS — I10 Essential (primary) hypertension: Secondary | ICD-10-CM | POA: Insufficient documentation

## 2018-04-14 DIAGNOSIS — K219 Gastro-esophageal reflux disease without esophagitis: Secondary | ICD-10-CM | POA: Diagnosis not present

## 2018-04-14 DIAGNOSIS — E039 Hypothyroidism, unspecified: Secondary | ICD-10-CM | POA: Insufficient documentation

## 2018-04-14 DIAGNOSIS — I739 Peripheral vascular disease, unspecified: Secondary | ICD-10-CM | POA: Diagnosis not present

## 2018-04-14 DIAGNOSIS — R0609 Other forms of dyspnea: Secondary | ICD-10-CM | POA: Diagnosis present

## 2018-04-14 DIAGNOSIS — R42 Dizziness and giddiness: Secondary | ICD-10-CM | POA: Diagnosis not present

## 2018-04-14 DIAGNOSIS — Z87891 Personal history of nicotine dependence: Secondary | ICD-10-CM | POA: Insufficient documentation

## 2018-04-14 DIAGNOSIS — Z6834 Body mass index (BMI) 34.0-34.9, adult: Secondary | ICD-10-CM | POA: Diagnosis not present

## 2018-04-14 DIAGNOSIS — J449 Chronic obstructive pulmonary disease, unspecified: Secondary | ICD-10-CM | POA: Diagnosis not present

## 2018-04-14 DIAGNOSIS — E663 Overweight: Secondary | ICD-10-CM | POA: Insufficient documentation

## 2018-04-14 LAB — MYOCARDIAL PERFUSION IMAGING
CHL CUP NUCLEAR SDS: 0
CHL CUP RESTING HR STRESS: 77 {beats}/min
LV dias vol: 61 mL (ref 46–106)
LVSYSVOL: 14 mL
NUC STRESS TID: 1.19
Peak HR: 96 {beats}/min
SRS: 0
SSS: 0

## 2018-04-14 MED ORDER — REGADENOSON 0.4 MG/5ML IV SOLN
0.4000 mg | Freq: Once | INTRAVENOUS | Status: AC
  Administered 2018-04-14: 0.4 mg via INTRAVENOUS

## 2018-04-14 MED ORDER — TECHNETIUM TC 99M TETROFOSMIN IV KIT
10.7000 | PACK | Freq: Once | INTRAVENOUS | Status: AC | PRN
  Administered 2018-04-14: 10.7 via INTRAVENOUS
  Filled 2018-04-14: qty 11

## 2018-04-14 MED ORDER — TECHNETIUM TC 99M TETROFOSMIN IV KIT
32.5000 | PACK | Freq: Once | INTRAVENOUS | Status: AC | PRN
Start: 2018-04-14 — End: 2018-04-14
  Administered 2018-04-14: 32.5 via INTRAVENOUS
  Filled 2018-04-14: qty 33

## 2018-04-25 ENCOUNTER — Ambulatory Visit (HOSPITAL_COMMUNITY)
Admission: RE | Admit: 2018-04-25 | Discharge: 2018-04-25 | Disposition: A | Discharge status: Home / Self Care | Payer: Medicare Other | Source: Ambulatory Visit | Attending: Internal Medicine | Admitting: Internal Medicine

## 2018-04-25 DIAGNOSIS — R0609 Other forms of dyspnea: Secondary | ICD-10-CM | POA: Insufficient documentation

## 2018-04-25 LAB — PULMONARY FUNCTION TEST
DL/VA % pred: 87 %
DL/VA: 3.56 ml/min/mmHg/L
DLCO UNC: 11.86 ml/min/mmHg
DLCO unc % pred: 67 %
FEF 25-75 Post: 1.98 L/sec
FEF 25-75 Pre: 1.5 L/sec
FEF2575-%Change-Post: 32 %
FEF2575-%Pred-Post: 166 %
FEF2575-%Pred-Pre: 125 %
FEV1-%CHANGE-POST: 3 %
FEV1-%PRED-PRE: 105 %
FEV1-%Pred-Post: 109 %
FEV1-POST: 1.66 L
FEV1-PRE: 1.6 L
FEV1FVC-%CHANGE-POST: 6 %
FEV1FVC-%Pred-Pre: 109 %
FEV6-%Change-Post: -3 %
FEV6-%PRED-PRE: 102 %
FEV6-%Pred-Post: 99 %
FEV6-POST: 1.92 L
FEV6-PRE: 1.98 L
FEV6FVC-%PRED-POST: 106 %
FEV6FVC-%Pred-Pre: 106 %
FVC-%Change-Post: -2 %
FVC-%PRED-POST: 93 %
FVC-%Pred-Pre: 95 %
FVC-POST: 1.94 L
FVC-Pre: 1.98 L
POST FEV6/FVC RATIO: 100 %
PRE FEV6/FVC RATIO: 100 %
Post FEV1/FVC ratio: 86 %
Pre FEV1/FVC ratio: 81 %
RV % PRED: 97 %
RV: 2.07 L
TLC % PRED: 95 %
TLC: 4.11 L

## 2018-04-25 MED ORDER — ALBUTEROL SULFATE (2.5 MG/3ML) 0.083% IN NEBU
2.5000 mg | INHALATION_SOLUTION | Freq: Once | RESPIRATORY_TRACT | Status: AC
  Administered 2018-04-25: 2.5 mg via RESPIRATORY_TRACT

## 2018-05-09 ENCOUNTER — Ambulatory Visit
Admission: RE | Admit: 2018-05-09 | Discharge: 2018-05-09 | Disposition: A | Discharge status: Home / Self Care | Payer: Medicare Other | Source: Ambulatory Visit | Attending: Internal Medicine | Admitting: Internal Medicine

## 2018-05-09 ENCOUNTER — Other Ambulatory Visit: Payer: Self-pay | Admitting: Internal Medicine

## 2018-05-09 DIAGNOSIS — R0789 Other chest pain: Secondary | ICD-10-CM

## 2018-06-14 ENCOUNTER — Ambulatory Visit: Payer: Medicare Other | Admitting: Women's Health

## 2018-06-14 ENCOUNTER — Encounter: Payer: Self-pay | Admitting: Women's Health

## 2018-06-14 VITALS — BP 134/70 | Wt 164.6 lb

## 2018-06-14 DIAGNOSIS — M810 Age-related osteoporosis without current pathological fracture: Secondary | ICD-10-CM | POA: Diagnosis not present

## 2018-06-14 DIAGNOSIS — N898 Other specified noninflammatory disorders of vagina: Secondary | ICD-10-CM | POA: Diagnosis not present

## 2018-06-14 LAB — WET PREP FOR TRICH, YEAST, CLUE

## 2018-06-14 MED ORDER — ALENDRONATE SODIUM 70 MG PO TABS: 70.0000 mg | ORAL_TABLET | ORAL | 11 refills | Status: DC

## 2018-06-14 MED ORDER — CLOBETASOL PROPIONATE 0.05 % EX CREA: TOPICAL_CREAM | CUTANEOUS | 3 refills | Status: DC

## 2018-06-14 NOTE — Patient Instructions (Signed)
Vaginitis Vaginitis is an inflammation of the vagina. It can happen when the normal bacteria and yeast in the vagina grow too much. There are different types. Treatment will depend on the type you have. Follow these instructions at home:  Take all medicines as told by your doctor.  Keep your vagina area clean and dry. Avoid soap. Rinse the area with water.  Avoid washing and cleaning out the vagina (douching).  Do not use tampons or have sex (intercourse) until your treatment is done.  Wipe from front to back after going to the restroom.  Wear cotton underwear.  Avoid wearing underwear while you sleep until your vaginitis is gone.  Avoid tight pants. Avoid underwear or nylons without a cotton panel.  Take off wet clothing (such as a bathing suit) as soon as you can.  Use mild, unscented products. Avoid fabric softeners and scented: ? Feminine sprays. ? Laundry detergents. ? Tampons. ? Soaps or bubble baths.  Practice safe sex and use condoms. Get help right away if:  You have belly (abdominal) pain.  You have a fever or lasting symptoms for more than 2-3 days.  You have a fever and your symptoms suddenly get worse. This information is not intended to replace advice given to you by your health care provider. Make sure you discuss any questions you have with your health care provider. Document Released: 02/18/2009 Document Revised: 04/29/2016 Document Reviewed: 05/04/2012 Elsevier Interactive Patient Education  2017 Elsevier Inc. Alendronate oral solution What is this medicine? ALENDRONATE (a LEN droe nate) slows calcium loss from bones. It helps to make normal healthy bone and to slow bone loss in people with Paget's disease and osteoporosis. It may be used in others at risk for bone loss. This medicine may be used for other purposes; ask your health care provider or pharmacist if you have questions. COMMON BRAND NAME(S): Fosamax What should I tell my health care provider  before I take this medicine? They need to know if you have any of these conditions: -esophagus, stomach, or intestine problems, like acid reflux or GERD -dental disease -kidney disease -low blood calcium -low vitamin D -problems swallowing -problems sitting or standing for 30 minutes -an unusual or allergic reaction to alendronate, other medicines, foods, dyes, or preservatives -pregnant or trying to get pregnant -breast-feeding How should I use this medicine? You must take this medicine exactly as directed or you will lower the amount of the medicine you absorb into your body or you may cause yourself harm. Take your dose by mouth first thing in the morning, after you are up for the day. Do not eat or drink anything before you take this solution. Use a specially marked spoon or container to measure the oral solution. Take the solution with 2 fluid ounces (1/4 cup) of plain water. Do not take the solution with any other drink. After taking this medicine do not eat breakfast, drink, or take any other medicines or vitamins for at least 30 minutes. Stand or sit up for at least 30 minutes after you take this medicine; do not lie down. Do not take your medicine more often than directed. Take this medicine on the same day every week. Talk to your pediatrician regarding the use of this medicine in children. Special care may be needed. Overdosage: If you think you have taken too much of this medicine contact a poison control center or emergency room at once. NOTE: This medicine is only for you. Do not share this medicine with  others. What if I miss a dose? If you miss a dose, take the dose on the morning after you remember. Then take your next dose on your regular day of the week. Never take 2 doses on the same day. Do not take double or extra doses. What may interact with this medicine? -aluminum hydroxide -antacids -aspirin -calcium supplements -drugs for inflammation like ibuprofen, naproxen, and  others -iron supplements -magnesium supplements -vitamins with minerals This list may not describe all possible interactions. Give your health care provider a list of all the medicines, herbs, non-prescription drugs, or dietary supplements you use. Also tell them if you smoke, drink alcohol, or use illegal drugs. Some items may interact with your medicine. What should I watch for while using this medicine? Visit your doctor for regular check ups. It may be some time before you see benefit from this medicine. Do not stop taking your medicine unless your doctor tells you to. Your doctor or health care professional may order regular blood tests to see how you are doing. You should make sure you get enough calcium and vitamin D in your diet while you are taking this medicine, unless your doctor tells you not to. Discuss the foods you eat and the vitamins you take with your health care professional. Some people who take this medicine have severe bone, joint, and/or muscle pain. This medicine may also increase your risk for a broken thigh bone. Tell your doctor right away if you have pain in your upper leg or groin. Tell your doctor if you have any pain that does not go away or that gets worse. This medicine can make you more sensitive to the sun. If you get a rash while taking this medicine, sunlight may cause the rash to get worse. Keep out of the sun. If you cannot avoid being in the sun, wear protective clothing and use sunscreen. Do not use sun lamps or tanning beds/booths. What side effects may I notice from receiving this medicine? Side effects that you should report to your doctor or health care professional as soon as possible: -allergic reactions like skin rash, itching or hives, swelling of the face, lips, or tongue -black or tarry stools -bone, muscle, or joint pain -changes in vision -chest pain -heartburn or stomach pain -jaw pain, especially after dental work -redness, blistering, peeling  or loosening of the skin, including inside the mouth -trouble or pain when swallowing Side effects that usually do not require medical attention (report to your doctor or health care professional if they continue or are bothersome): -changes in taste -diarrhea or constipation -eye pain or itching -headache -nausea or vomiting -stomach gas or fullness This list may not describe all possible side effects. Call your doctor for medical advice about side effects. You may report side effects to FDA at 1-800-FDA-1088. Where should I keep my medicine? Keep out of the reach of children. Store at room temperature of 15 and 30 degrees C (59 and 86 degrees F). Throw away any unused medicine after the expiration date. NOTE: This sheet is a summary. It may not cover all possible information. If you have questions about this medicine, talk to your doctor, pharmacist, or health care provider.  2018 Elsevier/Gold Standard (2011-05-21 08:54:51)

## 2018-06-14 NOTE — Progress Notes (Signed)
77 year old W WF G3, P3 presents with complaint of vaginal soreness with no itching or odor, and to discuss osteoporosis.  History of labial fusion, increased vaginal irritation, states wears a pad daily due to urinary dribbling.  Has been using over-the-counter A&D ointment daily with some relief of symptoms.  Postmenopausal on no HRT with no bleeding.  Labial fusion causing no difficulty with urination.  Not sexually active for many years.  At annual exam DEXA was discussed, hip T score -1.8 stable, -2.6 at radius at that time did not want to proceed with medication.  Is doing chair yoga.  Has chronic back and hip pain making exercise difficult.  Primary care manages medical problems hypertension, hypothyroidism, asthma.  Exam: Appears older than stated years, needs help getting up and down off exam table.  External genitalia labial fusion present, entire labial area extending towards rectum erythemic, no lesions or open areas noted, wet prep done with a Q-tip negative.  Labial fusion with vaginal irritation Osteoporosis, hip stable  Plan: Temovate, will small amount 4 times a week first week then 3 times a week second week and then twice a week weeks 3 and 4 and then as needed.  A&D ointment daily after showering, loose clothing, open to air as able. Osteoporosis discussed options, reviewed importance of home safety, fall prevention and weightbearing exercise as able.  Fosamax 70 mg 1 day weekly with full glass of water and stay upright 1 hour without eating.  Prescription, proper use given will call if problems.  Is not planning any dental work in the near future.

## 2018-06-15 ENCOUNTER — Telehealth: Payer: Self-pay | Admitting: *Deleted

## 2018-06-15 DIAGNOSIS — N898 Other specified noninflammatory disorders of vagina: Secondary | ICD-10-CM

## 2018-06-15 MED ORDER — CLOBETASOL PROPIONATE 0.05 % EX CREA: TOPICAL_CREAM | CUTANEOUS | 3 refills | Status: DC

## 2018-06-15 NOTE — Telephone Encounter (Signed)
[place] called stating patient said directions on clobetasol cream 0.05% were not same as discussed in office visit per note on 06/14/18 " small amount 4 times a week first week then 3 times a week second week and then twice a week weeks 3 and 4 and then as needed" I will call [facility] and provide directions as noted in chart.

## 2018-07-04 DIAGNOSIS — H02833 Dermatochalasis of right eye, unspecified eyelid: Secondary | ICD-10-CM | POA: Insufficient documentation

## 2018-07-04 DIAGNOSIS — H02836 Dermatochalasis of left eye, unspecified eyelid: Secondary | ICD-10-CM

## 2018-09-19 ENCOUNTER — Other Ambulatory Visit: Payer: Self-pay | Admitting: Internal Medicine

## 2018-09-19 DIAGNOSIS — R1310 Dysphagia, unspecified: Secondary | ICD-10-CM

## 2018-09-22 ENCOUNTER — Other Ambulatory Visit: Payer: Medicare Other

## 2018-11-06 ENCOUNTER — Other Ambulatory Visit: Payer: Self-pay | Admitting: Internal Medicine

## 2018-11-06 ENCOUNTER — Ambulatory Visit
Admission: RE | Admit: 2018-11-06 | Discharge: 2018-11-06 | Disposition: A | Discharge status: Home / Self Care | Payer: Medicare Other | Source: Ambulatory Visit | Attending: Internal Medicine | Admitting: Internal Medicine

## 2018-11-06 DIAGNOSIS — M25551 Pain in right hip: Secondary | ICD-10-CM

## 2018-11-06 DIAGNOSIS — M25552 Pain in left hip: Secondary | ICD-10-CM

## 2018-11-06 DIAGNOSIS — M179 Osteoarthritis of knee, unspecified: Secondary | ICD-10-CM

## 2018-12-08 DIAGNOSIS — M25552 Pain in left hip: Secondary | ICD-10-CM | POA: Insufficient documentation

## 2018-12-13 ENCOUNTER — Other Ambulatory Visit (HOSPITAL_COMMUNITY): Payer: Self-pay | Admitting: Nurse Practitioner

## 2018-12-13 ENCOUNTER — Ambulatory Visit (HOSPITAL_COMMUNITY)
Admission: RE | Admit: 2018-12-13 | Discharge: 2018-12-13 | Disposition: A | Discharge status: Home / Self Care | Payer: Medicare Other | Source: Ambulatory Visit | Attending: Nurse Practitioner | Admitting: Nurse Practitioner

## 2018-12-13 DIAGNOSIS — I2510 Atherosclerotic heart disease of native coronary artery without angina pectoris: Secondary | ICD-10-CM | POA: Diagnosis not present

## 2018-12-13 DIAGNOSIS — R140 Abdominal distension (gaseous): Secondary | ICD-10-CM

## 2018-12-13 DIAGNOSIS — R109 Unspecified abdominal pain: Secondary | ICD-10-CM | POA: Insufficient documentation

## 2018-12-13 DIAGNOSIS — I700 Atherosclerosis of aorta: Secondary | ICD-10-CM | POA: Diagnosis not present

## 2019-02-01 ENCOUNTER — Encounter: Payer: Self-pay | Admitting: Women's Health

## 2019-02-21 ENCOUNTER — Ambulatory Visit: Payer: Medicare Other | Admitting: Women's Health

## 2019-02-21 ENCOUNTER — Encounter: Payer: Self-pay | Admitting: Women's Health

## 2019-02-21 ENCOUNTER — Other Ambulatory Visit: Payer: Self-pay

## 2019-02-21 VITALS — BP 122/80 | Ht 59.0 in | Wt 153.0 lb

## 2019-02-21 DIAGNOSIS — E7800 Pure hypercholesterolemia, unspecified: Secondary | ICD-10-CM | POA: Insufficient documentation

## 2019-02-21 DIAGNOSIS — B372 Candidiasis of skin and nail: Secondary | ICD-10-CM

## 2019-02-21 DIAGNOSIS — E119 Type 2 diabetes mellitus without complications: Secondary | ICD-10-CM | POA: Insufficient documentation

## 2019-02-21 DIAGNOSIS — Z01419 Encounter for gynecological examination (general) (routine) without abnormal findings: Secondary | ICD-10-CM

## 2019-02-21 DIAGNOSIS — E039 Hypothyroidism, unspecified: Secondary | ICD-10-CM | POA: Insufficient documentation

## 2019-02-21 DIAGNOSIS — E1165 Type 2 diabetes mellitus with hyperglycemia: Secondary | ICD-10-CM | POA: Insufficient documentation

## 2019-02-21 LAB — WET PREP FOR TRICH, YEAST, CLUE

## 2019-02-21 MED ORDER — NYSTATIN-TRIAMCINOLONE 100000-0.1 UNIT/GM-% EX OINT: 1.0000 "application " | TOPICAL_OINTMENT | Freq: Two times a day (BID) | CUTANEOUS | 0 refills | Status: DC

## 2019-02-21 NOTE — Progress Notes (Signed)
Christina Sellers [birth year] [id number]    History:    Presents for breast and pelvic exam.  Currently having a rash on the left inner groin area.  Labial fusion without difficulty urinating.  Primary care manages diabetes, hypertension, COPD, hypothyroidism.  Sjogren's, IBS and GERD.  2019 T score -1.8 in the hip stable, started on Prolia per primary care.  Has chronic back and foot pain does do chair yoga.  2017 colonoscopy Negative polyps with diverticuli.  Has had Pneumovax not Shingrex.    Past medical history, past surgical history, family history and social history were all reviewed and documented in the EPIC chart.  One son who lives local attentive, one daughter in [place], one daughter in [place].  ROS:  A ROS was performed and pertinent positives and negatives are included.  Exam:  Vitals:   02/21/19 1105  BP: 122/80  Weight: 153 lb (69.4 kg)  Height: 4\' 11"  (1.499 m)   Body mass index is 30.9 kg/m.   General appearance:  Normal Thyroid:  Symmetrical, normal in size, without palpable masses or nodularity. Respiratory  Auscultation:  Clear without wheezing or rhonchi Cardiovascular  Auscultation:  Regular rate, without rubs, murmurs or gallops  Edema/varicosities:  Not grossly evident Abdominal  Soft,nontender, without masses, guarding or rebound.  Liver/spleen:  No organomegaly noted  Hernia:  None appreciated  Skin  Inspection:  Grossly normal   Breasts: Examined lying and sitting.     Right: Without masses, retractions, discharge or axillary adenopathy.     Left: Without masses, retractions, discharge or axillary adenopathy. Gentitourinary   Inguinal/mons:  Normal without inguinal adenopathy  External genitalia: Labial fusion BUS/Urethra/Skene's glands:  Normal  Vagina: Atrophic negative wet prep  Cervix: Not visualized  Uterus:  normal in size, shape and contour.  Midline and mobile  Adnexa/parametria:     Rt: Without masses or tenderness.   Lt: Without  masses or tenderness.  Anus and perineum: Normal  Digital rectal exam: Normal sphincter tone without palpated masses or tenderness  Assessment/Plan:  78 y.o. W WF G2, P3 for rest and pelvic exam.  Superficial yeast appearing rash on left inner groin Postmenopausal no HRT with no bleeding Labial fusion no difficulty urination Diabetes diagnosed this year, hypothyroidism, hypercholesteremia-primary care manages labs and meds Sjogren's, IBS, GERD Osteopenia on Prolia per primary care  Plan: Encouraged to continue the low-carb, low sugar diet, exercise as able has chronic foot and back pain.  SBEs, continue annual screening mammogram, calcium rich foods, vitamin D 2000 daily encouraged.  Home safety, fall prevention discussed.  Mycolog ointment twice daily for several days to affected area call if no repeat relief.    Huel Cote [facility], 1:02 PM 02/21/2019

## 2019-02-21 NOTE — Patient Instructions (Signed)
shingrex vaccine for shingles  Health Maintenance After Age 80 After age 63, you are at a higher risk for certain long-term diseases and infections as well as injuries from falls. Falls are a major cause of broken bones and head injuries in people who are older than age 76. Getting regular preventive care can help to keep you healthy and well. Preventive care includes getting regular testing and making lifestyle changes as recommended by your health care provider. Talk with your health care provider about:  Which screenings and tests you should have. A screening is a test that checks for a disease when you have no symptoms.  A diet and exercise plan that is right for you. What should I know about screenings and tests to prevent falls? Screening and testing are the best ways to find a health problem early. Early diagnosis and treatment give you the best chance of managing medical conditions that are common after age 17. Certain conditions and lifestyle choices may make you more likely to have a fall. Your health care provider may recommend:  Regular vision checks. Poor vision and conditions such as cataracts can make you more likely to have a fall. If you wear glasses, make sure to get your prescription updated if your vision changes.  Medicine review. Work with your health care provider to regularly review all of the medicines you are taking, including over-the-counter medicines. Ask your health care provider about any side effects that may make you more likely to have a fall. Tell your health care provider if any medicines that you take make you feel dizzy or sleepy.  Osteoporosis screening. Osteoporosis is a condition that causes the bones to get weaker. This can make the bones weak and cause them to break more easily.  Blood pressure screening. Blood pressure changes and medicines to control blood pressure can make you feel dizzy.  Strength and balance checks. Your health care provider may  recommend certain tests to check your strength and balance while standing, walking, or changing positions.  Foot health exam. Foot pain and numbness, as well as not wearing proper footwear, can make you more likely to have a fall.  Depression screening. You may be more likely to have a fall if you have a fear of falling, feel emotionally low, or feel unable to do activities that you used to do.  Alcohol use screening. Using too much alcohol can affect your balance and may make you more likely to have a fall. What actions can I take to lower my risk of falls? General instructions  Talk with your health care provider about your risks for falling. Tell your health care provider if: ? You fall. Be sure to tell your health care provider about all falls, even ones that seem minor. ? You feel dizzy, sleepy, or off-balance.  Take over-the-counter and prescription medicines only as told by your health care provider. These include any supplements.  Eat a healthy diet and maintain a healthy weight. A healthy diet includes low-fat dairy products, low-fat (lean) meats, and fiber from whole grains, beans, and lots of fruits and vegetables. Home safety  Remove any tripping hazards, such as rugs, cords, and clutter.  Install safety equipment such as grab bars in bathrooms and safety rails on stairs.  Keep rooms and walkways well-lit. Activity   Follow a regular exercise program to stay fit. This will help you maintain your balance. Ask your health care provider what types of exercise are appropriate for you.  If  you need a cane or walker, use it as recommended by your health care provider.  Wear supportive shoes that have nonskid soles. Lifestyle  Do not drink alcohol if your health care provider tells you not to drink.  If you drink alcohol, limit how much you have: ? 0-1 drink a day for women. ? 0-2 drinks a day for men.  Be aware of how much alcohol is in your drink. In the [place] one drink  equals one typical bottle of beer (12 oz), one-half glass of wine (5 oz), or one shot of hard liquor (1 oz).  Do not use any products that contain nicotine or tobacco, such as cigarettes and e-cigarettes. If you need help quitting, ask your health care provider. Summary  Having a healthy lifestyle and getting preventive care can help to protect your health and wellness after age 56.  Screening and testing are the best way to find a health problem early and help you avoid having a fall. Early diagnosis and treatment give you the best chance for managing medical conditions that are more common for people who are older than age 82.  Falls are a major cause of broken bones and head injuries in people who are older than age 54. Take precautions to prevent a fall at home.  Work with your health care provider to learn what changes you can make to improve your health and wellness and to prevent falls. This information is not intended to replace advice given to you by your health care provider. Make sure you discuss any questions you have with your health care provider. Document Released: 10/05/2017 Document Revised: 10/05/2017 Document Reviewed: 10/05/2017 Elsevier Interactive Patient Education  2019 [employer].

## 2019-02-23 LAB — URINALYSIS, COMPLETE W/RFL CULTURE
BACTERIA UA: NONE SEEN /HPF
Bilirubin Urine: NEGATIVE
Glucose, UA: NEGATIVE
Hgb urine dipstick: NEGATIVE
Hyaline Cast: NONE SEEN /LPF
Ketones, ur: NEGATIVE
NITRITES URINE, INITIAL: NEGATIVE
PH: 7 (ref 5.0–8.0)
PROTEIN: NEGATIVE
RBC / HPF: NONE SEEN /HPF (ref 0–2)
SPECIFIC GRAVITY, URINE: 1.016 (ref 1.001–1.03)

## 2019-02-23 LAB — URINE CULTURE
MICRO NUMBER: 336829
SPECIMEN QUALITY: ADEQUATE

## 2019-02-23 LAB — CULTURE INDICATED

## 2019-02-26 ENCOUNTER — Other Ambulatory Visit: Payer: Self-pay | Admitting: *Deleted

## 2019-02-26 MED ORDER — AMPICILLIN 250 MG PO CAPS: 250.0000 mg | ORAL_CAPSULE | Freq: Three times a day (TID) | ORAL | 0 refills | Status: DC

## 2019-02-27 ENCOUNTER — Telehealth: Payer: Self-pay | Admitting: *Deleted

## 2019-02-27 MED ORDER — AMPICILLIN 500 MG PO CAPS: 500.0000 mg | ORAL_CAPSULE | Freq: Two times a day (BID) | ORAL | 0 refills | Status: DC

## 2019-02-27 NOTE — Telephone Encounter (Signed)
New Rx sent.

## 2019-02-27 NOTE — Telephone Encounter (Signed)
Ampicillin 500 twice daily for 3 days

## 2019-02-27 NOTE — Telephone Encounter (Signed)
Rx was prescribed ampicillin 250 tid for 3 days yesterday, pharmacy sent fax this am stating this dose is not available. They do have ampicillin 500 mg if you want to change to this dose or change medication all together? Please advise

## 2019-04-05 ENCOUNTER — Encounter: Payer: Self-pay | Admitting: Podiatry

## 2019-04-05 ENCOUNTER — Ambulatory Visit (INDEPENDENT_AMBULATORY_CARE_PROVIDER_SITE_OTHER): Payer: Medicare Other

## 2019-04-05 ENCOUNTER — Other Ambulatory Visit: Payer: Self-pay | Admitting: Podiatry

## 2019-04-05 ENCOUNTER — Ambulatory Visit: Payer: Medicare Other | Admitting: Podiatry

## 2019-04-05 ENCOUNTER — Other Ambulatory Visit: Payer: Self-pay

## 2019-04-05 DIAGNOSIS — M79672 Pain in left foot: Secondary | ICD-10-CM

## 2019-04-05 DIAGNOSIS — M21619 Bunion of unspecified foot: Secondary | ICD-10-CM

## 2019-04-05 DIAGNOSIS — M7752 Other enthesopathy of left foot: Secondary | ICD-10-CM | POA: Diagnosis not present

## 2019-04-05 DIAGNOSIS — L84 Corns and callosities: Secondary | ICD-10-CM | POA: Diagnosis not present

## 2019-04-05 DIAGNOSIS — M779 Enthesopathy, unspecified: Secondary | ICD-10-CM

## 2019-04-05 MED ORDER — TRIAMCINOLONE ACETONIDE 10 MG/ML IJ SUSP
10.0000 mg | Freq: Once | INTRAMUSCULAR | Status: AC
Start: 2019-04-05 — End: 2019-04-05
  Administered 2019-04-05: 17:00:00 10 mg

## 2019-04-05 NOTE — Progress Notes (Signed)
Subjective:   Patient ended attempted fashion without successful resolution: Christina Sellers, female   DOB: 78 y.o.   MRN: [id number]   HPI Patient presents concerned about her bunion deformity left and severe painful corn callus formation x2 plantar left.  States that she is now limping and states the one lesion has become very bad over the last few months in patients not been seen for a year and a half   ROS      Objective:  Physical Exam  Neurovascular status intact with patient found to have a very inflamed area on the second MPJ with keratotic lesion and also keratotic lesion sub-third metatarsal left with bone prominence.  Severe bunion deformity that was     Assessment:  Capsulitis second MPJ left with bunion deformity and plantarflexed third metatarsal with lesion formations     Plan:  H&P x-rays reviewed condition discussed and at this point I do not recommend further surgery for the bunion but I do recommend wider shoes and I did go ahead and I did a subsecond capsular injection 3 mg Dexasone Kenalog 5 mg Xylocaine debrided the lesion second MPJ third MPJ and discussed possible modification of the orthotic with moving the pad to the third MPJ.  Patient will be seen back to recheck  X-ray indicates that there is multiple fixation in the left foot secondary to previous surgery and a non-corrected bunion deformity

## 2019-04-18 ENCOUNTER — Other Ambulatory Visit: Payer: Self-pay | Admitting: Internal Medicine

## 2019-04-18 ENCOUNTER — Ambulatory Visit
Admission: RE | Admit: 2019-04-18 | Discharge: 2019-04-18 | Disposition: A | Discharge status: Home / Self Care | Payer: Medicare Other | Source: Ambulatory Visit | Attending: Internal Medicine | Admitting: Internal Medicine

## 2019-04-18 ENCOUNTER — Other Ambulatory Visit: Payer: Self-pay

## 2019-04-18 DIAGNOSIS — M87052 Idiopathic aseptic necrosis of left femur: Secondary | ICD-10-CM

## 2019-05-03 ENCOUNTER — Other Ambulatory Visit: Payer: Self-pay

## 2019-05-04 ENCOUNTER — Ambulatory Visit: Payer: Medicare Other | Admitting: Gynecology

## 2019-05-04 ENCOUNTER — Encounter: Payer: Self-pay | Admitting: Gynecology

## 2019-05-04 VITALS — BP 126/80

## 2019-05-04 DIAGNOSIS — N3000 Acute cystitis without hematuria: Secondary | ICD-10-CM

## 2019-05-04 MED ORDER — NITROFURANTOIN MONOHYD MACRO 100 MG PO CAPS: 100.0000 mg | ORAL_CAPSULE | Freq: Two times a day (BID) | ORAL | 0 refills | Status: DC

## 2019-05-04 NOTE — Patient Instructions (Signed)
Take the prescribed antibiotic twice daily for 7 days.  Follow-up if your symptoms persist, worsen or recur. 

## 2019-05-04 NOTE — Progress Notes (Signed)
    Christina Sellers Apr 09, 1941 [id number]        78 y.o.  G2P2003 presents complaining of vulvar irritation and swelling.  Feels that she is developing a UTI.  Says that she always gets these same symptoms with in a UTI.  Also notes that her urine output seems a little bit less than from what she is drinking.  No dysuria, urgency, frequency, fevers or chills.  No vaginal discharge or odor.  Past medical history,surgical history, problem list, medications, allergies, family history and social history were all reviewed and documented in the EPIC chart.  Directed ROS with pertinent positives and negatives documented in the history of present illness/assessment and plan.  Exam: Caryn Bee assistant Vitals:   05/04/19 1118  BP: 126/80   General appearance:  Normal Abdomen soft nontender without masses guarding rebound Pelvic external BUS vagina with atrophic changes.  Vaginal opening is restricted due to some labial fusion but urethral opening is clear.  No digital bimanual performed.  Assessment/Plan:  78 y.o. G2P2003 with history as above.  Urine analysis consistent with UTI showing moderate bacteria 40-60 WBC.  3-10 RBC.  Will cover with Macrobid 100 mg twice daily x7 days.  Will follow-up if symptoms persist, worsen or recur.    Anastasio Auerbach MD, 11:44 AM 05/04/2019

## 2019-05-04 NOTE — Addendum Note (Signed)
Addended by: Nelva Nay on: 05/04/2019 12:10 PM   Modules accepted: Orders

## 2019-05-06 LAB — URINALYSIS, COMPLETE W/RFL CULTURE
Bilirubin Urine: NEGATIVE
Glucose, UA: NEGATIVE
Hyaline Cast: NONE SEEN /LPF
Ketones, ur: NEGATIVE
Nitrites, Initial: NEGATIVE
Protein, ur: NEGATIVE
Specific Gravity, Urine: 1.015 (ref 1.001–1.03)
pH: 7 (ref 5.0–8.0)

## 2019-05-06 LAB — URINE CULTURE
MICRO NUMBER:: 522381
SPECIMEN QUALITY:: ADEQUATE

## 2019-05-06 LAB — CULTURE INDICATED

## 2019-05-13 ENCOUNTER — Encounter (HOSPITAL_COMMUNITY): Payer: Self-pay | Admitting: Emergency Medicine

## 2019-05-13 ENCOUNTER — Other Ambulatory Visit: Payer: Self-pay

## 2019-05-13 ENCOUNTER — Emergency Department (HOSPITAL_COMMUNITY)
Admission: EM | Admit: 2019-05-13 | Discharge: 2019-05-13 | Disposition: A | Discharge status: Home / Self Care | Payer: Medicare Other | Attending: Emergency Medicine | Admitting: Emergency Medicine

## 2019-05-13 DIAGNOSIS — Z7984 Long term (current) use of oral hypoglycemic drugs: Secondary | ICD-10-CM | POA: Insufficient documentation

## 2019-05-13 DIAGNOSIS — T7840XA Allergy, unspecified, initial encounter: Secondary | ICD-10-CM

## 2019-05-13 DIAGNOSIS — E1151 Type 2 diabetes mellitus with diabetic peripheral angiopathy without gangrene: Secondary | ICD-10-CM | POA: Insufficient documentation

## 2019-05-13 DIAGNOSIS — Z79899 Other long term (current) drug therapy: Secondary | ICD-10-CM | POA: Diagnosis not present

## 2019-05-13 DIAGNOSIS — E039 Hypothyroidism, unspecified: Secondary | ICD-10-CM | POA: Insufficient documentation

## 2019-05-13 DIAGNOSIS — Z87891 Personal history of nicotine dependence: Secondary | ICD-10-CM | POA: Diagnosis not present

## 2019-05-13 DIAGNOSIS — J449 Chronic obstructive pulmonary disease, unspecified: Secondary | ICD-10-CM | POA: Insufficient documentation

## 2019-05-13 DIAGNOSIS — I10 Essential (primary) hypertension: Secondary | ICD-10-CM | POA: Insufficient documentation

## 2019-05-13 MED ORDER — FAMOTIDINE 20 MG PO TABS
40.0000 mg | ORAL_TABLET | Freq: Once | ORAL | Status: AC
  Administered 2019-05-13: 21:00:00 40 mg via ORAL
  Filled 2019-05-13: qty 2

## 2019-05-13 MED ORDER — DIPHENHYDRAMINE HCL 50 MG/ML IJ SOLN
25.0000 mg | Freq: Once | INTRAMUSCULAR | Status: DC
  Filled 2019-05-13: qty 1

## 2019-05-13 MED ORDER — PREDNISONE 20 MG PO TABS: 60.0000 mg | ORAL_TABLET | Freq: Once | ORAL | Status: DC

## 2019-05-13 NOTE — ED Triage Notes (Signed)
Pt reports getting bit by a mosquito at appx 6pm today on left lower leg then began swelling and redness.

## 2019-05-13 NOTE — Discharge Instructions (Addendum)
Take Benadryl as directed.  Return here if the swelling gets worse or if you have trouble breathing

## 2019-05-13 NOTE — ED Notes (Signed)
Bed: WA01 Expected date:  Expected time:  Means of arrival:  Comments: 

## 2019-05-13 NOTE — ED Provider Notes (Addendum)
[facility] DEPT Provider Note   CSN: [id number] Arrival date & time: 05/13/19  1957    History   Chief Complaint Chief Complaint  Patient presents with  . Insect Bite    HPI Christina Sellers is a 78 y.o. female.     78 year old female presents with acute onset of left lower extremity swelling after being stung by an insect.  Endorse pruritus but denied any trouble breathing.  No throat closing.  Started on the distal portion of her calf and then started to go upwards.  No fever or chills.  This started approximately 2-1/2 hours ago and symptoms have improved without therapy.  No other rashes on her body noted.  Was at her baseline state of health before this happened     Past Medical History:  Diagnosis Date  . Abnormal Pap smear of vagina   . Allergic sinusitis   . Anxiety   . Atherosclerotic peripheral vascular disease ([place])   . Carotid artery stenosis   . Cellulitis   . COPD with asthma ([place])    not an issue now  . Depression   . Diabetes mellitus type 2, uncomplicated ([place])   . DJD (degenerative joint disease)   . Elevated cholesterol   . Elevated MCV    secondary to alchol  . GERD (gastroesophageal reflux disease)    not an issue now.  . Gout   . Hearing difficulty    bilateral hearing aids  . History of recurrent UTIs   . Hypertension    loss weight'off meds now"  . Hypothyroidism   . IBS (irritable bowel syndrome)   . Insomnia   . Labial fusion   . Osteoarthritis of left knee   . Osteopenia   . Recurrent vaginitis   . Sjogren's syndrome ([place])   . Syncope 11.7.14   secondary to dehydration and possible hypoglycemia  . Tendonitis   . Thyroid disease   . Transfusion history    age 40 "anemia"    Patient Active Problem List   Diagnosis Date Noted  . Diabetes type 2, controlled ([place]) 02/21/2019  . Hypothyroidism 02/21/2019  . Hypercholesteremia 02/21/2019  . Pain of left hip joint 12/08/2018  . Dermatochalasis of  both eyelids 07/04/2018  . Viral labyrinthitis 01/13/2017  . Atherosclerotic peripheral vascular disease ([place])   . Essential hypertension, benign 11/22/2013  . Osteopenia 11/22/2013  . Labial fusion     Past Surgical History:  Procedure Laterality Date  . CATARACT EXTRACTION, BILATERAL Bilateral   . CHOLECYSTECTOMY     open  . COLONOSCOPY WITH PROPOFOL N/A 03/23/2016   Procedure: COLONOSCOPY WITH PROPOFOL;  Surgeon: Garlan Fair, MD;  Location: [facility] ENDOSCOPY;  Service: Endoscopy;  Laterality: N/A;  . EYE SURGERY    . FOOT SURGERY Left    retained hardware  . KNEE SURGERY Left    scope   . TONSILLECTOMY       OB History    Gravida  2   Para  2   Term  2   Preterm      AB      Living  3     SAB      TAB      Ectopic      Multiple  1   Live Births               Home Medications    Prior to Admission medications   Medication Sig Start Date End Date Taking? Authorizing  Provider  acetaminophen (TYLENOL) 500 MG tablet Take 1,000 mg by mouth every 6 (six) hours as needed for mild pain.    [provider]  betamethasone valerate ointment (VALISONE) 0.1 % Apply 1 application topically 2 (two) times daily. 02/14/18   Huel Cote, NP  Cholecalciferol (VITAMIN D3) 1000 UNITS CAPS Take 1,000 mg by mouth daily.      [provider]  clobetasol cream (TEMOVATE) 0.05 % Week 1 (apply small amount 4 times),Week 2 ( apply small amount 3 times),Then twice weekly week 3 & 4  Then prn Patient not taking: Reported on 05/04/2019 06/15/18   Huel Cote, NP  CRESTOR 10 MG tablet Take 10 mg by mouth as directed. Takes 10 mg Monday Wednesday and Friday,and 5 mg the rest of the week 11/08/15   [provider]  diclofenac sodium (VOLTAREN) 1 % GEL  03/29/19   [provider]  fluorometholone (FML) 0.1 % ophthalmic suspension  12/27/18   [provider]  fluticasone (FLONASE) 50 MCG/ACT nasal spray Place 1 spray into both nostrils  daily.    [provider]  levothyroxine (SYNTHROID, LEVOTHROID) 100 MCG tablet Take 1 tablet by mouth daily before breakfast.  10/23/14   [provider]  loratadine-pseudoephedrine (CLARITIN-D 12-HOUR) 5-120 MG tablet Take by mouth.    [provider]  meloxicam (MOBIC) 15 MG tablet meloxicam 15 mg tablet    [provider]  metFORMIN (GLUCOPHAGE) 500 MG tablet Take 1 tablet by mouth daily. 02/20/19   [provider]  nitrofurantoin, macrocrystal-monohydrate, (MACROBID) 100 MG capsule Take 1 capsule (100 mg total) by mouth 2 (two) times daily. 05/04/19   Fontaine, Belinda Block, MD  nystatin-triamcinolone ointment (MYCOLOG) Apply 1 application topically 2 (two) times daily. Patient not taking: Reported on 05/04/2019 02/21/19   Huel Cote, NP  psyllium (HYDROCIL/METAMUCIL) 95 % PACK Take 1 packet by mouth daily.    [provider]  RESTASIS 0.05 % ophthalmic emulsion Place 1 drop into both eyes 2 (two) times daily.  11/13/15   [provider]  triamterene-hydrochlorothiazide (MAXZIDE-25) 37.5-25 MG tablet  10/23/18   [provider]  vitamin E 400 UNIT capsule Take 400 Units by mouth daily.      [provider]    Family History Family History  Problem Relation Age of Onset  . Diabetes Maternal Aunt   . Cancer Father        Oral cancer  . Breast cancer Sister        Age 8's  . Cancer Brother        Lung cancer    Social History Social History   Tobacco Use  . Smoking status: Former Smoker    Last attempt to quit: 02/21/1982    Years since quitting: 37.2  . Smokeless tobacco: Never Used  Substance Use Topics  . Alcohol use: No  . Drug use: No     Allergies   Celexa [citalopram hydrobromide]; Fluconazole; Green tea leaf ext; Levaquin [levofloxacin in d5w]; Losartan; Scopolamine; Sulfa antibiotics; and Trimethoprim   Review of Systems Review of Systems  All other systems reviewed and are  negative.    Physical Exam Updated Vital Signs BP (!) 201/71 (BP Location: Right Arm)   Pulse 85   Temp 98.5 F (36.9 C) (Oral)   Resp 14   Ht 1.499 m (4\' 11" )   Wt 66.2 kg   SpO2 97%   BMI 29.49 kg/m   Physical Exam Vitals  signs and nursing note reviewed.  Constitutional:      General: She is not in acute distress.    Appearance: Normal appearance. She is well-developed. She is not toxic-appearing.  HENT:     Head: Normocephalic and atraumatic.  Eyes:     General: Lids are normal.     Conjunctiva/sclera: Conjunctivae normal.     Pupils: Pupils are equal, round, and reactive to light.  Neck:     Musculoskeletal: Normal range of motion and neck supple.     Thyroid: No thyroid mass.     Trachea: No tracheal deviation.  Cardiovascular:     Rate and Rhythm: Normal rate and regular rhythm.     Heart sounds: Normal heart sounds. No murmur. No gallop.   Pulmonary:     Effort: Pulmonary effort is normal. No respiratory distress.     Breath sounds: Normal breath sounds. No stridor. No decreased breath sounds, wheezing, rhonchi or rales.  Abdominal:     General: Bowel sounds are normal. There is no distension.     Palpations: Abdomen is soft.     Tenderness: There is no abdominal tenderness. There is no rebound.  Musculoskeletal: Normal range of motion.        General: No tenderness.       Legs:  Skin:    General: Skin is warm and dry.     Findings: No abrasion or rash.  Neurological:     Mental Status: She is alert and oriented to person, place, and time.     GCS: GCS eye subscore is 4. GCS verbal subscore is 5. GCS motor subscore is 6.     Cranial Nerves: No cranial nerve deficit.     Sensory: No sensory deficit.  Psychiatric:        Speech: Speech normal.        Behavior: Behavior normal.      ED Treatments / Results  Labs (all labs ordered are listed, but only abnormal results are displayed) Labs Reviewed - No data to display  EKG None  Radiology No  results found.  Procedures Procedures (including critical care time)  Medications Ordered in ED Medications  diphenhydrAMINE (BENADRYL) injection 25 mg (has no administration in time range)  famotidine (PEPCID) tablet 40 mg (has no administration in time range)     Initial Impression / Assessment and Plan / ED Course  I have reviewed the triage vital signs and the nursing notes.  Pertinent labs & imaging results that were available during my care of the patient were reviewed by me and considered in my medical decision making (see chart for details).        Patient treated for suspected allergic reaction.  She has no signs of systemic involvement.  Patient given Benadryl IM here as well as Pepcid p.o.  Instructed to use Benadryl as needed.  Final Clinical Impressions(s) / ED Diagnoses   Final diagnoses:  None    ED Discharge Orders    None       Lacretia Leigh, MD 05/13/19 2027    Lacretia Leigh, MD 05/13/19 2027

## 2019-07-16 ENCOUNTER — Other Ambulatory Visit: Payer: Self-pay

## 2019-07-17 ENCOUNTER — Ambulatory Visit (INDEPENDENT_AMBULATORY_CARE_PROVIDER_SITE_OTHER): Payer: Medicare Other | Admitting: Women's Health

## 2019-07-17 ENCOUNTER — Encounter: Payer: Self-pay | Admitting: Women's Health

## 2019-07-17 VITALS — BP 134/80

## 2019-07-17 DIAGNOSIS — N898 Other specified noninflammatory disorders of vagina: Secondary | ICD-10-CM

## 2019-07-17 NOTE — Patient Instructions (Signed)
Lichen Sclerosus Lichen sclerosus is a skin problem. It can happen on any part of the body, but it commonly involves the anal or genital areas. It can cause itching and discomfort in these areas. Treatment can help to control symptoms. When the genital area is affected, getting treatment is important because the condition can cause scarring that may lead to other problems. What are the causes? The cause of this condition is not known. It may be related to an overactive immune system or a lack of certain hormones. Lichen sclerosus is not an infection or a fungus, and it is not passed from one person to another (not contagious). What increases the risk? This condition is more likely to develop in women, usually after menopause. What are the signs or symptoms? Symptoms of this condition include:  Thin, wrinkled, white areas on the skin.  Thickened white areas on the skin.  Red and swollen patches (lesions) on the skin.  Tears or cracks in the skin.  Bruising.  Blood blisters.  Severe itching.  Pain, itching, or burning when urinating. Constipation is also common in people with lichen sclerosus. How is this diagnosed? This condition may be diagnosed with a physical exam. In some cases, a tissue sample (biopsy sample) may be removed to be looked at under a microscope. How is this treated? This condition is usually treated with medicated creams or ointments (topical steroids) that are applied over the affected areas. In some cases, treatment may also include medicines that are taken by mouth. Surgery may be needed in more severe cases that are causing problems such as scarring. Follow these instructions at home:  Take or use over-the-counter and prescription medicines only as told by your health care provider.  Use creams or ointments as told by your health care provider.  Do not scratch the affected areas of skin.  If you are a woman, be sure to keep the vaginal area as clean and dry  as possible.  Clean the affected area of skin gently with water. Avoid using rough towels or toilet paper.  Keep all follow-up visits as told by your health care provider. This is important. Contact a health care provider if:  You have increasing redness, swelling, or pain in the affected area.  You have fluid, blood, or pus coming from the affected area.  You have new lesions on your skin.  You have a fever.  You have pain during sex. Summary  Lichen sclerosus is a skin problem. When the genital area is affected, getting treatment is important because the condition can cause scarring that may lead to other problems.  This condition is usually treated with medicated creams or ointments (topical steroids) that are applied over the affected areas.  Take or use over-the-counter and prescription medicines only as told by your health care provider.  Contact a health care provider if you have new lesions on your skin, have pain during sex, or have increasing redness, swelling, or pain in the affected area.  Keep all follow-up visits as told by your health care provider. This is important. This information is not intended to replace advice given to you by your health care provider. Make sure you discuss any questions you have with your health care provider. Document Released: 04/14/2011 Document Revised: 04/06/2018 Document Reviewed: 04/06/2018 Elsevier Patient Education  2020 [employer].

## 2019-07-17 NOTE — Progress Notes (Signed)
78 year old W WF G2, P3 presents with complaint of raw, irritated, burning sensation area at vaginal opening  on right side which is getting worse.  History of lichen sclerosis, labial fusion, and has had frequent issues with vaginal irritation.  Denies urinary symptoms, abdominal pain, vaginal discharge or fever.  Has chronic back and left foot pain making mobility difficult uses a walker.  Medical problems include hypertension, COPD, hypothyroidism, Sjogren's, started on metformin in January and has lost 20 pounds with diet and has lowered hemoglobin A1c from 7 to 6.  Postmenopausal with no bleeding on no HRT, not sexually active in many years.  Exam: Appears stated age, no CVAT, abdomen obese, nontender, external genitalia erythemic at introitus, on right  introitus 1 cm patch, appears to be LS, wet prep done with a Q-tip negative.  Lichen sclerosus  Plan: Small amount of Temovate that patient brought with her applied to that area which gave her relief.  Will use small amount twice daily for several days, then at bedtime as needed.  Reviewed to use sparingly can thin the skin.  Has recently started water aerobics reviewed the importance of getting out of wet bathing suit after class making sure area is dry.  Open to air as able.  Instructed to call if continued problems.  Congratulated her on her weight loss, making dietary changes and lowering her hemoglobin A1c.

## 2019-07-18 LAB — WET PREP FOR TRICH, YEAST, CLUE

## 2019-07-25 ENCOUNTER — Other Ambulatory Visit: Payer: Self-pay

## 2019-07-26 ENCOUNTER — Ambulatory Visit (INDEPENDENT_AMBULATORY_CARE_PROVIDER_SITE_OTHER): Payer: Medicare Other | Admitting: Obstetrics & Gynecology

## 2019-07-26 ENCOUNTER — Encounter: Payer: Self-pay | Admitting: Obstetrics & Gynecology

## 2019-07-26 DIAGNOSIS — N898 Other specified noninflammatory disorders of vagina: Secondary | ICD-10-CM

## 2019-07-26 DIAGNOSIS — N9089 Other specified noninflammatory disorders of vulva and perineum: Secondary | ICD-10-CM | POA: Diagnosis not present

## 2019-07-26 DIAGNOSIS — N3000 Acute cystitis without hematuria: Secondary | ICD-10-CM

## 2019-07-26 LAB — WET PREP FOR TRICH, YEAST, CLUE

## 2019-07-26 MED ORDER — NITROFURANTOIN MONOHYD MACRO 100 MG PO CAPS: 100.0000 mg | ORAL_CAPSULE | Freq: Two times a day (BID) | ORAL | 0 refills | Status: AC

## 2019-07-26 NOTE — Progress Notes (Signed)
    Christina Sellers 27-Sep-1941 [id number]        78 y.o.  G2P2003   RP: Vulvar irritation worsening x 2 days  HPI: Vulvar irritation worsening x 2 days.  No vaginal discharge.  Known partial labial fusion.  Not using the Temovate twice daily as instructed on 8/11th by Elon Alas.  Heavy feeling, discomfort in pelvis.  Overall not feeling too well.  No urinary frequency or dysuria.  No fever.   OB History  Gravida Para Term Preterm AB Living  2 2 2     3   SAB TAB Ectopic Multiple Live Births        1      # Outcome Date GA Lbr Len/2nd Weight Sex Delivery Anes PTL Lv  2 Term           1 Term             Past medical history,surgical history, problem list, medications, allergies, family history and social history were all reviewed and documented in the EPIC chart.   Directed ROS with pertinent positives and negatives documented in the history of present illness/assessment and plan.  Exam:  There were no vitals filed for this visit. General appearance:  Normal  CVAT negative  Gynecologic exam: Vulvar with erythema.  Atrophy of menopause.  Anterior fusion of labia.  Mild increase in vaginal discharge.  Wet prep done.  U/A:  Color interference, chemical N/A.  WBC 20-40, RBC 0-2, Few Bacteria.  Urine Culture pending.  Wet prep: Negative   Assessment/Plan:  78 y.o. G2P2003   1. Vulvar irritation Vulvar inflammation in the setting of a very atrophic postmenopausal vulva.  Possibly due to contact with an irritant.  Patient started doing water aerobics.  Wet prep negative.  Probable acute cystitis.  Recommend applying clobetasol ointment twice a day for 2 weeks and then slowing down to twice a week. - Urinalysis,Complete w/RFL Culture  2. Vaginal discharge Wet prep negative. - WET PREP FOR [place], YEAST, CLUE  3. Acute cystitis without hematuria Probable acute cystitis per urine analysis.  Pending urine culture.  Will treat with Macrobid twice a day for 5 days.  Usage  reviewed and prescription sent to pharmacy.  Other orders - nitrofurantoin, macrocrystal-monohydrate, (MACROBID) 100 MG capsule; Take 1 capsule (100 mg total) by mouth 2 (two) times daily for 5 days.  Counseling on above issues and coordination of care more than 50% for 25 minutes.  Princess Bruins MD, 12:19 PM 07/26/2019

## 2019-07-26 NOTE — Patient Instructions (Signed)
1. Vulvar irritation Vulvar inflammation in the setting of a very atrophic postmenopausal vulva.  Possibly due to contact with an irritant.  Patient started doing water aerobics.  Wet prep negative.  Probable acute cystitis.  Recommend applying clobetasol ointment twice a day for 2 weeks and then slowing down to twice a week. - Urinalysis,Complete w/RFL Culture  2. Vaginal discharge Wet prep negative. - WET PREP FOR [place], YEAST, CLUE  3. Acute cystitis without hematuria Probable acute cystitis per urine analysis.  Pending urine culture.  Will treat with Macrobid twice a day for 5 days.  Usage reviewed and prescription sent to pharmacy.  Other orders - nitrofurantoin, macrocrystal-monohydrate, (MACROBID) 100 MG capsule; Take 1 capsule (100 mg total) by mouth 2 (two) times daily for 5 days.  Rosemaria, it was a pleasure meeting you today!  I will inform you of your urine culture results as soon as they are available.

## 2019-07-29 LAB — CULTURE INDICATED

## 2019-07-29 LAB — URINE CULTURE
MICRO NUMBER:: 797564
SPECIMEN QUALITY:: ADEQUATE

## 2019-07-29 LAB — URINALYSIS, COMPLETE W/RFL CULTURE: Hyaline Cast: NONE SEEN /LPF

## 2019-08-02 NOTE — Telephone Encounter (Signed)
Called patient and per DPR access note on file I left detailed message in voice mail. 

## 2019-08-07 ENCOUNTER — Other Ambulatory Visit: Payer: Self-pay | Admitting: Women's Health

## 2019-08-07 NOTE — Telephone Encounter (Signed)
Okay for refill have her schedule office appointment if no relief.  Reviewed to only use a small amount can thin the skin.

## 2019-08-08 NOTE — Telephone Encounter (Signed)
Spoke with patient and advised her. ?

## 2019-09-03 ENCOUNTER — Encounter: Payer: Self-pay | Admitting: Women's Health

## 2019-09-03 ENCOUNTER — Other Ambulatory Visit: Payer: Self-pay

## 2019-09-03 ENCOUNTER — Ambulatory Visit (INDEPENDENT_AMBULATORY_CARE_PROVIDER_SITE_OTHER): Payer: Medicare Other | Admitting: Women's Health

## 2019-09-03 VITALS — BP 124/78

## 2019-09-03 DIAGNOSIS — R300 Dysuria: Secondary | ICD-10-CM

## 2019-09-03 DIAGNOSIS — N898 Other specified noninflammatory disorders of vagina: Secondary | ICD-10-CM | POA: Diagnosis not present

## 2019-09-03 LAB — WET PREP FOR TRICH, YEAST, CLUE

## 2019-09-03 MED ORDER — PHENAZOPYRIDINE HCL 200 MG PO TABS: 200.0000 mg | ORAL_TABLET | Freq: Three times a day (TID) | ORAL | 0 refills | Status: DC | PRN

## 2019-09-03 NOTE — Progress Notes (Signed)
78 year old W WF G2, P3 presents with complaint of 3-day history of urinary burning especially at initiation of urination.  Denies vaginal discharge, back/abdominal pain, fever, nausea, or pain at the end of stream of urination.  Denies incontinence.  07/26/2019 was treated with Macrobid twice daily for 5 days for UTI positive culture E. coli which did resolve urinary symptoms at that time.  Has used Pyridium with some relief.  History of labial fusion and has had problems with urinary irritation.  Medical problems include chronic back pain, walks using a walker, hypertension, COPD, hypothyroidism, Sjogren's, diabetes.  Has lost approximately 20 pounds since being diagnosed with diabetes with diet,  last hemoglobin A1c was 6.  Not sexually active in many years postmenopausal on no HRT.  Exam: Appears well.  No CVAT but does have lumbar back pain.  External genitalia erythematous at introitus, labial fusion, wet prep with Q-tip done negative. UA: +1 leukocytes, 10-20 WBCs, 0-2 RBCs, 6-10 squamous epithelials, few bacteria  Urinary burning  Plan: Refill of Pyridium 200 mg to take 3 times daily as needed given.  Urine culture pending.  Reviewed best not to over use antibiotics and importance of waiting for culture results.  Encouraged to increase fluids, over-the-counter A&D ointment externally.  UTI prevention discussed and encouraged to continue drinking plenty of fluids.

## 2019-09-05 LAB — URINALYSIS, COMPLETE W/RFL CULTURE
Bilirubin Urine: NEGATIVE
Glucose, UA: NEGATIVE
Hgb urine dipstick: NEGATIVE
Hyaline Cast: NONE SEEN /LPF
Ketones, ur: NEGATIVE
Nitrites, Initial: POSITIVE — AB
Protein, ur: NEGATIVE
Specific Gravity, Urine: 1.015 (ref 1.001–1.03)
pH: 5 (ref 5.0–8.0)

## 2019-09-05 LAB — URINE CULTURE
MICRO NUMBER:: 928711
SPECIMEN QUALITY:: ADEQUATE

## 2019-09-05 LAB — CULTURE INDICATED

## 2019-09-07 NOTE — Progress Notes (Signed)
Thanks, I worry about these older ladies!

## 2019-09-11 ENCOUNTER — Telehealth: Payer: Self-pay

## 2019-09-11 NOTE — Telephone Encounter (Signed)
Patient said she was doing great.  She said this morning she showered and when she got out she used the blow dryer on low to dry her genital area to be sure it is good and dry and if felt irritated. She said she looked in mirror and it is all red. She said she put the last cream on it that you prescribed and is wearing a skirt today and hope it settles on down.  She said no itching. Just mild irritation when she put the heat on it. She will call prn.

## 2019-09-11 NOTE — Telephone Encounter (Signed)
-----   Message from Huel Cote, NP sent at 09/07/2019 12:44 PM EDT ----- Pl call and ask if better, I sent a my chart message informing neg urine culture

## 2019-09-11 NOTE — Telephone Encounter (Signed)
Spoke with patient and advised her. ?

## 2019-09-11 NOTE — Telephone Encounter (Signed)
Pl call and tell her to just put some A&D that will help with the skin irritation, protect the skin

## 2019-09-13 ENCOUNTER — Encounter: Payer: Self-pay | Admitting: Gynecology

## 2019-10-05 DIAGNOSIS — G8929 Other chronic pain: Secondary | ICD-10-CM | POA: Insufficient documentation

## 2019-10-26 ENCOUNTER — Other Ambulatory Visit: Payer: Self-pay

## 2019-10-29 ENCOUNTER — Other Ambulatory Visit: Payer: Self-pay

## 2019-10-29 ENCOUNTER — Ambulatory Visit: Payer: Medicare Other | Admitting: Women's Health

## 2019-10-29 ENCOUNTER — Encounter: Payer: Self-pay | Admitting: Women's Health

## 2019-10-29 VITALS — BP 126/78

## 2019-10-29 DIAGNOSIS — N898 Other specified noninflammatory disorders of vagina: Secondary | ICD-10-CM

## 2019-10-29 DIAGNOSIS — B3731 Acute candidiasis of vulva and vagina: Secondary | ICD-10-CM

## 2019-10-29 DIAGNOSIS — B373 Candidiasis of vulva and vagina: Secondary | ICD-10-CM

## 2019-10-29 DIAGNOSIS — R350 Frequency of micturition: Secondary | ICD-10-CM | POA: Diagnosis not present

## 2019-10-29 LAB — WET PREP FOR TRICH, YEAST, CLUE

## 2019-10-29 MED ORDER — TERCONAZOLE 0.4 % VA CREA: 1.0000 | TOPICAL_CREAM | Freq: Every day | VAGINAL | 0 refills | Status: DC

## 2019-10-29 NOTE — Progress Notes (Signed)
78 y.o. [facility] G2P2 presents with complaints of vulvar irritation, urinary or vaginal odor, questionable vaginal bulge and low back pain. Denies pelvic pain, fever, chills, nausea, incontinence, and other urinary symptoms. History of labial fusion, lichen sclerosus, and frequent issues with vaginal irritation. Seen 08/2019 for dysuria, 2x in 07/2019 for vulvar irritation. Medical history of chronic back pain, arthritis, COPD, HTN, hypothyroidism, prediabetes, Sjorgen's. Recently lost 14 lbs with diet, and has lowered hemoglobin A1C from 7 to 6. Postmenopausal on no HRT with no abnormal bleeding, not sexually active.   Exam: Appears well.  No CVAT, chronic low back sacral pain history/arthritis, external genitalia anterior fusion, mild erythema and introitus.  left inner labia superficial irritation appears to be healing. Wet prep done with q-tip positive for yeast,  Urinalysis: 6-10 WBCs, 10-20 squamous epithelial, few bacteria, and trace leukocytes.   Yeast vaginitis  Vulvar irritation   Plan: Terconazole 0.4% cream, place 1 applicator vaginally at bedtime, prescription given and reviewed. Reviewed nystatin-triamcinolone ointment use. Instructed to continue A&D cream daily. Discussed yeast prevention, UTI prevention, and skin care.  Urine culture  pending. Instructed to call if symptoms worsen or do not improve. Congratulations given on weight loss, lifestyle changes, and lowering hemoglobin A1C levels.

## 2019-10-29 NOTE — Patient Instructions (Signed)
Vaginal Yeast infection, Adult  Vaginal yeast infection is a condition that causes vaginal discharge as well as soreness, swelling, and redness (inflammation) of the vagina. This is a common condition. Some women get this infection frequently. What are the causes? This condition is caused by a change in the normal balance of the yeast (candida) and bacteria that live in the vagina. This change causes an overgrowth of yeast, which causes the inflammation. What increases the risk? The condition is more likely to develop in women who:  Take antibiotic medicines.  Have diabetes.  Take birth control pills.  Are pregnant.  Douche often.  Have a weak body defense system (immune system).  Have been taking steroid medicines for a long time.  Frequently wear tight clothing. What are the signs or symptoms? Symptoms of this condition include:  White, thick, creamy vaginal discharge.  Swelling, itching, redness, and irritation of the vagina. The lips of the vagina (vulva) may be affected as well.  Pain or a burning feeling while urinating.  Pain during sex. How is this diagnosed? This condition is diagnosed based on:  Your medical history.  A physical exam.  A pelvic exam. Your health care provider will examine a sample of your vaginal discharge under a microscope. Your health care provider may send this sample for testing to confirm the diagnosis. How is this treated? This condition is treated with medicine. Medicines may be over-the-counter or prescription. You may be told to use one or more of the following:  Medicine that is taken by mouth (orally).  Medicine that is applied as a cream (topically).  Medicine that is inserted directly into the vagina (suppository). Follow these instructions at home:  Lifestyle  Do not have sex until your health care provider approves. Tell your sex partner that you have a yeast infection. That person should go to his or her health care  provider and ask if they should also be treated.  Do not wear tight clothes, such as pantyhose or tight pants.  Wear breathable cotton underwear. General instructions  Take or apply over-the-counter and prescription medicines only as told by your health care provider.  Eat more yogurt. This may help to keep your yeast infection from returning.  Do not use tampons until your health care provider approves.  Try taking a sitz bath to help with discomfort. This is a warm water bath that is taken while you are sitting down. The water should only come up to your hips and should cover your buttocks. Do this 3-4 times per day or as told by your health care provider.  Do not douche.  If you have diabetes, keep your blood sugar levels under control.  Keep all follow-up visits as told by your health care provider. This is important. Contact a health care provider if:  You have a fever.  Your symptoms go away and then return.  Your symptoms do not get better with treatment.  Your symptoms get worse.  You have new symptoms.  You develop blisters in or around your vagina.  You have blood coming from your vagina and it is not your menstrual period.  You develop pain in your abdomen. Summary  Vaginal yeast infection is a condition that causes discharge as well as soreness, swelling, and redness (inflammation) of the vagina.  This condition is treated with medicine. Medicines may be over-the-counter or prescription.  Take or apply over-the-counter and prescription medicines only as told by your health care provider.  Do not douche.   Do not have sex or use tampons until your health care provider approves.  Contact a health care provider if your symptoms do not get better with treatment or your symptoms go away and then return. This information is not intended to replace advice given to you by your health care provider. Make sure you discuss any questions you have with your health care  provider. Document Released: 09/01/2005 Document Revised: 04/10/2018 Document Reviewed: 04/10/2018 Elsevier Patient Education  2020 Elsevier Inc.  

## 2019-10-30 ENCOUNTER — Other Ambulatory Visit: Payer: Self-pay

## 2019-10-30 ENCOUNTER — Telehealth: Payer: Self-pay

## 2019-10-30 MED ORDER — NYSTATIN 100000 UNIT/GM EX CREA: 1.0000 "application " | TOPICAL_CREAM | Freq: Two times a day (BID) | CUTANEOUS | 0 refills | Status: DC

## 2019-10-30 NOTE — Telephone Encounter (Signed)
Rx sent 

## 2019-10-30 NOTE — Telephone Encounter (Signed)
She has no recall of any problems, may be best to just use nystatin, please call in:Marland Kitchen  Thanks

## 2019-10-30 NOTE — Telephone Encounter (Signed)
Insert 1 applicator at bedtime for 3 nights and then externally as needed

## 2019-10-30 NOTE — Telephone Encounter (Signed)
Please advise regarding directions for Nystatin cream.

## 2019-10-30 NOTE — Telephone Encounter (Signed)
Terconazole prescribed yesterday.  Pharmacy sent note "Patient had Fluconazole allergy. Does MD want to change Rx?".

## 2019-10-31 LAB — URINE CULTURE
MICRO NUMBER:: 1129463
Result:: NO GROWTH
SPECIMEN QUALITY:: ADEQUATE

## 2019-10-31 LAB — CULTURE INDICATED

## 2019-10-31 LAB — URINALYSIS, COMPLETE W/RFL CULTURE
Bilirubin Urine: NEGATIVE
Glucose, UA: NEGATIVE
Hgb urine dipstick: NEGATIVE
Hyaline Cast: NONE SEEN /LPF
Ketones, ur: NEGATIVE
Nitrites, Initial: NEGATIVE
Protein, ur: NEGATIVE
RBC / HPF: NONE SEEN /HPF (ref 0–2)
Specific Gravity, Urine: 1.015 (ref 1.001–1.03)
pH: 5.5 (ref 5.0–8.0)

## 2019-11-13 ENCOUNTER — Telehealth: Payer: Self-pay

## 2019-11-13 NOTE — Telephone Encounter (Signed)
Patient called in voice mail stating she wanted [place] to call her.  At recent office visit received Nystatin Rx.  She said she has not used it yet. Just got it today. She said directions say to insert it vaginally x 3 nights. However, #1 It did not come with an applicator. #2 It came with a warning. Do not apply this medication to eyes,nose, mouth or vagina.  SHe is confused about what to do.

## 2019-11-13 NOTE — Telephone Encounter (Signed)
Telephone call, instructed to place the medication on the tip of her finger and insert your introitus and externally twice daily and call if continued itching.

## 2019-12-18 ENCOUNTER — Ambulatory Visit: Payer: Medicare Other | Attending: Internal Medicine

## 2019-12-18 DIAGNOSIS — Z23 Encounter for immunization: Secondary | ICD-10-CM | POA: Insufficient documentation

## 2019-12-18 NOTE — Progress Notes (Signed)
   Covid-19 Vaccination Clinic  Name:  Albertia Vanderwal    MRN: [id number] DOB: 08-27-1941  12/18/2019  Ms. Gadway was observed post Covid-19 immunization for 15 minutes without incidence. She was provided with Vaccine Information Sheet and instruction to access the V-Safe system.   Ms. Kleve was instructed to call 911 with any severe reactions post vaccine: Marland Kitchen Difficulty breathing  . Swelling of your face and throat  . A fast heartbeat  . A bad rash all over your body  . Dizziness and weakness    Immunizations Administered    Name Date Dose VIS Date Route   Pfizer COVID-19 Vaccine 12/18/2019 10:37 AM 0.3 mL 11/16/2019 Intramuscular   Manufacturer: [employer], [employer]   Lot: [id number]   [place]: [id number]

## 2020-01-04 ENCOUNTER — Telehealth (HOSPITAL_COMMUNITY): Payer: Self-pay

## 2020-01-04 NOTE — Telephone Encounter (Signed)

## 2020-01-07 ENCOUNTER — Encounter: Payer: Self-pay | Admitting: Surgery

## 2020-01-07 ENCOUNTER — Ambulatory Visit (INDEPENDENT_AMBULATORY_CARE_PROVIDER_SITE_OTHER): Payer: Medicare Other | Admitting: Surgery

## 2020-01-07 ENCOUNTER — Ambulatory Visit
Admission: RE | Admit: 2020-01-07 | Discharge: 2020-01-07 | Disposition: A | Discharge status: Home / Self Care | Payer: Medicare Other | Source: Ambulatory Visit | Attending: Surgery | Admitting: Surgery

## 2020-01-07 ENCOUNTER — Other Ambulatory Visit: Payer: Self-pay

## 2020-01-07 ENCOUNTER — Ambulatory Visit: Payer: Medicare Other

## 2020-01-07 VITALS — BP 171/63 | HR 66 | Temp 97.5°F | Resp 20 | Wt 143.0 lb

## 2020-01-07 DIAGNOSIS — Z23 Encounter for immunization: Secondary | ICD-10-CM

## 2020-01-07 DIAGNOSIS — R600 Localized edema: Secondary | ICD-10-CM

## 2020-01-07 DIAGNOSIS — I739 Peripheral vascular disease, unspecified: Secondary | ICD-10-CM | POA: Insufficient documentation

## 2020-01-07 NOTE — Progress Notes (Signed)
   Covid-19 Vaccination Clinic  Name:  Christina Sellers    MRN: [id number] DOB: 1941-10-29  01/07/2020  Christina Sellers was observed post Covid-19 immunization for 15 minutes without incidence. She was provided with Vaccine Information Sheet and instruction to access the V-Safe system.   Christina Sellers was instructed to call 911 with any severe reactions post vaccine: Marland Kitchen Difficulty breathing  . Swelling of your face and throat  . A fast heartbeat  . A bad rash all over your body  . Dizziness and weakness    Immunizations Administered    Name Date Dose VIS Date Route   Pfizer COVID-19 Vaccine 01/07/2020 10:02 AM 0.3 mL 11/16/2019 Intramuscular   Manufacturer: [place]   Lot: [id number]   [place]: [id number]

## 2020-01-07 NOTE — Progress Notes (Signed)
[facility]  Patient name: Christina Sellers MRN: [id number] DOB: February 13, 1941 Sex: female   REQUESTING PROVIDER:    Dr. Inda Merlin   REASON FOR CONSULT:    Leg edema  HISTORY OF PRESENT ILLNESS:   Christina Sellers is a 79 y.o. female, who is referred today for evaluation of leg pain and swelling.  The patient states that she has been having pain along the left anterior shin for many years.  It is very sensitive to the touch.  About 7 months ago, she began having swelling to her left leg as well as a discoloration.  She has tried diuretics without any significant relief.  The patient was diagnosed with diabetes last year.  She is a former smoker.  She is medically managed for hypertension.  She has high cholesterol, for which she takes a statin.  PAST MEDICAL HISTORY    Past Medical History:  Diagnosis Date  . Abnormal Pap smear of vagina   . Allergic sinusitis   . Anxiety   . Atherosclerotic peripheral vascular disease ([place])   . Carotid artery stenosis   . Cellulitis   . COPD with asthma ([place])    not an issue now  . Depression   . Diabetes mellitus type 2, uncomplicated ([place])   . DJD (degenerative joint disease)   . Elevated cholesterol   . Elevated MCV    secondary to alchol  . GERD (gastroesophageal reflux disease)    not an issue now.  . Gout   . Hearing difficulty    bilateral hearing aids  . History of recurrent UTIs   . Hypertension    loss weight'off meds now"  . Hypothyroidism   . IBS (irritable bowel syndrome)   . Insomnia   . Labial fusion   . Osteoarthritis of left knee   . Osteopenia   . Recurrent vaginitis   . Sjogren's syndrome ([place])   . Syncope 11.7.14   secondary to dehydration and possible hypoglycemia  . Tendonitis   . Thyroid disease   . Transfusion history    age 42 "anemia"     FAMILY HISTORY   Family History  Problem Relation Age of Onset  . Diabetes Maternal Aunt   .  Cancer Father        Oral cancer  . Breast cancer Sister        Age 57's  . Cancer Brother        Lung cancer    SOCIAL HISTORY:   Social History   Socioeconomic History  . Marital status: Widowed    Spouse name: Not on file  . Number of children: Not on file  . Years of education: Not on file  . Highest education level: Not on file  Occupational History  . Not on file  Tobacco Use  . Smoking status: Former Smoker    Quit date: 02/21/1982    Years since quitting: 37.9  . Smokeless tobacco: Never Used  Substance and Sexual Activity  . Alcohol use: No  . Drug use: No  . Sexual activity: Never    Birth control/protection: Post-menopausal  Other Topics Concern  . Not on file  Social History Narrative  . Not on file   Social Determinants of Health   Financial Resource Strain:   . Difficulty of Paying Living Expenses: Not on file  Food Insecurity:   . Worried About Charity fundraiser in the Last Year: Not on file  . Ran Out of  Food in the Last Year: Not on file  Transportation Needs:   . Lack of Transportation (Medical): Not on file  . Lack of Transportation (Non-Medical): Not on file  Physical Activity:   . Days of Exercise per Week: Not on file  . Minutes of Exercise per Session: Not on file  Stress:   . Feeling of Stress : Not on file  Social Connections:   . Frequency of Communication with Friends and Family: Not on file  . Frequency of Social Gatherings with Friends and Family: Not on file  . Attends Religious Services: Not on file  . Active Member of Clubs or Organizations: Not on file  . Attends Archivist Meetings: Not on file  . Marital Status: Not on file  Intimate Partner Violence:   . Fear of Current or Ex-Partner: Not on file  . Emotionally Abused: Not on file  . Physically Abused: Not on file  . Sexually Abused: Not on file    ALLERGIES:    Allergies  Allergen Reactions  . Celexa [Citalopram Hydrobromide] Other (See Comments)     nightmares  . Fluconazole     Cant remember  . Green Tea Leaf Ext   . Levaquin [Levofloxacin In D5w] Hives  . Losartan Other (See Comments)  . Scopolamine   . Sulfa Antibiotics Other (See Comments)    Cant remember  . Trimethoprim Hives    CURRENT MEDICATIONS:    Current Outpatient Medications  Medication Sig Dispense Refill  . acetaminophen (TYLENOL) 500 MG tablet Take 1,000 mg by mouth every 6 (six) hours as needed for mild pain.    Marland Kitchen betamethasone valerate ointment (VALISONE) 0.1 % Apply 1 application topically 2 (two) times daily. 30 g 0  . Cholecalciferol (VITAMIN D3) 1000 UNITS CAPS Take 1,000 mg by mouth daily.      . clobetasol cream (TEMOVATE) 0.05 % WEEK 1:APPLY 4 TIMES A WEEK. WEEK 2: APPLY 3 TIMES A WEEK. WEEKS 3&4:APPLY TWICE WEEKLY. 30 g 0  . CRESTOR 10 MG tablet Take 10 mg by mouth as directed. Takes 10 mg Monday Wednesday and Friday,and 5 mg the rest of the week  10  . diclofenac sodium (VOLTAREN) 1 % GEL     . fluorometholone (FML) 0.1 % ophthalmic suspension     . fluticasone (FLONASE) 50 MCG/ACT nasal spray Place 1 spray into both nostrils daily.    Marland Kitchen levothyroxine (SYNTHROID, LEVOTHROID) 100 MCG tablet Take 1 tablet by mouth daily before breakfast.   11  . loratadine-pseudoephedrine (CLARITIN-D 12-HOUR) 5-120 MG tablet Take by mouth.    . meloxicam (MOBIC) 15 MG tablet meloxicam 15 mg tablet    . metFORMIN (GLUCOPHAGE) 500 MG tablet Take 1 tablet by mouth daily.    Marland Kitchen nystatin cream (MYCOSTATIN) Apply 1 application topically 2 (two) times daily. Insert one applicatoful intravaginally hs x 3 nights. 30 g 0  . nystatin-triamcinolone ointment (MYCOLOG) Apply 1 application topically 2 (two) times daily. 60 g 0  . psyllium (HYDROCIL/METAMUCIL) 95 % PACK Take 1 packet by mouth daily.    . RESTASIS 0.05 % ophthalmic emulsion Place 1 drop into both eyes 2 (two) times daily.   2  . terconazole (TERAZOL 7) 0.4 % vaginal cream Place 1 applicator vaginally at bedtime. 45 g 0    . triamterene-hydrochlorothiazide (MAXZIDE-25) 37.5-25 MG tablet     . vitamin E 400 UNIT capsule Take 400 Units by mouth daily.       No current facility-administered medications for  this visit.    REVIEW OF SYSTEMS:   [X]  denotes positive finding, [ ]  denotes negative finding Cardiac  Comments:  Chest pain or chest pressure:    Shortness of breath upon exertion:    Short of breath when lying flat:    Irregular heart rhythm:        Vascular    Pain in calf, thigh, or hip brought on by ambulation:    Pain in feet at night that wakes you up from your sleep:     Blood clot in your veins:    Leg swelling:         Pulmonary    Oxygen at home:    Productive cough:     Wheezing:         Neurologic    Sudden weakness in arms or legs:     Sudden numbness in arms or legs:     Sudden onset of difficulty speaking or slurred speech:    Temporary loss of vision in one eye:     Problems with dizziness:         Gastrointestinal    Blood in stool:      Vomited blood:         Genitourinary    Burning when urinating:     Blood in urine:        Psychiatric    Major depression:         Hematologic    Bleeding problems:    Problems with blood clotting too easily:        Skin    Rashes or ulcers:        Constitutional    Fever or chills:     PHYSICAL EXAM:   There were no vitals filed for this visit.  GENERAL: The patient is a well-nourished female, in no acute distress. The vital signs are documented above. CARDIAC: There is a regular rate and rhythm.  VASCULAR: Brisk posterior tibial and dorsalis pedis Doppler signals bilaterally PULMONARY: Nonlabored respirations  MUSCULOSKELETAL: There are no major deformities or cyanosis. NEUROLOGIC: No focal weakness or paresthesias are detected. SKIN: There are no ulcers or rashes noted. PSYCHIATRIC: The patient has a normal affect.  STUDIES:   I have reviewed the following: LEFT VENOUS REFLUX: There is no evidence of deep  vein thrombosis in the lower extremity.  There is no evidence of superficial venous thrombosis.  There is deep vein thrombosis in the common femoral vein.  There is superficial vein thrombosis in the sapheno-femoral junction and  mid thigh great saphenous vien.   ASSESSMENT and PLAN   Left leg pain and swelling: I evaluated the patient's blood flow with hand-held Doppler and she has triphasic posterior tibial dorsalis pedis Doppler signals, indicating that she does not have arterial insufficiency.  She had a venous reflux study today that was unremarkable.  Therefore I do not believe venous insufficiency is contributing to her symptoms.  Most likely this represents lymphedema.  We talked about the need for leg elevation as well as compression therapy.  She was given instructions on how to obtain the appropriate compression stockings which she will do.  She can also use lotion on her leg to help with the peeling of her skin.  She knows to contact me should she develop any further questions.   Leia Alf, MD, FACS [facility] [phone] Pager [phone]

## 2020-03-06 ENCOUNTER — Other Ambulatory Visit: Payer: Self-pay

## 2020-03-10 ENCOUNTER — Ambulatory Visit (INDEPENDENT_AMBULATORY_CARE_PROVIDER_SITE_OTHER): Payer: Medicare Other | Admitting: Women's Health

## 2020-03-10 ENCOUNTER — Encounter: Payer: Self-pay | Admitting: Women's Health

## 2020-03-10 ENCOUNTER — Other Ambulatory Visit: Payer: Self-pay

## 2020-03-10 VITALS — BP 166/80 | Ht 59.0 in | Wt 136.0 lb

## 2020-03-10 DIAGNOSIS — R350 Frequency of micturition: Secondary | ICD-10-CM

## 2020-03-10 DIAGNOSIS — Z01419 Encounter for gynecological examination (general) (routine) without abnormal findings: Secondary | ICD-10-CM

## 2020-03-10 DIAGNOSIS — Q525 Fusion of labia: Secondary | ICD-10-CM | POA: Diagnosis not present

## 2020-03-10 NOTE — Progress Notes (Signed)
lab

## 2020-03-10 NOTE — Patient Instructions (Addendum)
Vit d 2000 iu daily Pleasure knowing you!!! shingrex vaccine Health Maintenance After Age 81 After age 58, you are at a higher risk for certain long-term diseases and infections as well as injuries from falls. Falls are a major cause of broken bones and head injuries in people who are older than age 47. Getting regular preventive care can help to keep you healthy and well. Preventive care includes getting regular testing and making lifestyle changes as recommended by your health care provider. Talk with your health care provider about:  Which screenings and tests you should have. A screening is a test that checks for a disease when you have no symptoms.  A diet and exercise plan that is right for you. What should I know about screenings and tests to prevent falls? Screening and testing are the best ways to find a health problem early. Early diagnosis and treatment give you the best chance of managing medical conditions that are common after age 57. Certain conditions and lifestyle choices may make you more likely to have a fall. Your health care provider may recommend:  Regular vision checks. Poor vision and conditions such as cataracts can make you more likely to have a fall. If you wear glasses, make sure to get your prescription updated if your vision changes.  Medicine review. Work with your health care provider to regularly review all of the medicines you are taking, including over-the-counter medicines. Ask your health care provider about any side effects that may make you more likely to have a fall. Tell your health care provider if any medicines that you take make you feel dizzy or sleepy.  Osteoporosis screening. Osteoporosis is a condition that causes the bones to get weaker. This can make the bones weak and cause them to break more easily.  Blood pressure screening. Blood pressure changes and medicines to control blood pressure can make you feel dizzy.  Strength and balance checks.  Your health care provider may recommend certain tests to check your strength and balance while standing, walking, or changing positions.  Foot health exam. Foot pain and numbness, as well as not wearing proper footwear, can make you more likely to have a fall.  Depression screening. You may be more likely to have a fall if you have a fear of falling, feel emotionally low, or feel unable to do activities that you used to do.  Alcohol use screening. Using too much alcohol can affect your balance and may make you more likely to have a fall. What actions can I take to lower my risk of falls? General instructions  Talk with your health care provider about your risks for falling. Tell your health care provider if: ? You fall. Be sure to tell your health care provider about all falls, even ones that seem minor. ? You feel dizzy, sleepy, or off-balance.  Take over-the-counter and prescription medicines only as told by your health care provider. These include any supplements.  Eat a healthy diet and maintain a healthy weight. A healthy diet includes low-fat dairy products, low-fat (lean) meats, and fiber from whole grains, beans, and lots of fruits and vegetables. Home safety  Remove any tripping hazards, such as rugs, cords, and clutter.  Install safety equipment such as grab bars in bathrooms and safety rails on stairs.  Keep rooms and walkways well-lit. Activity   Follow a regular exercise program to stay fit. This will help you maintain your balance. Ask your health care provider what types of exercise are  appropriate for you.  If you need a cane or walker, use it as recommended by your health care provider.  Wear supportive shoes that have nonskid soles. Lifestyle  Do not drink alcohol if your health care provider tells you not to drink.  If you drink alcohol, limit how much you have: ? 0-1 drink a day for women. ? 0-2 drinks a day for men.  Be aware of how much alcohol is in your  drink. In the [place] one drink equals one typical bottle of beer (12 oz), one-half glass of wine (5 oz), or one shot of hard liquor (1 oz).  Do not use any products that contain nicotine or tobacco, such as cigarettes and e-cigarettes. If you need help quitting, ask your health care provider. Summary  Having a healthy lifestyle and getting preventive care can help to protect your health and wellness after age 48.  Screening and testing are the best way to find a health problem early and help you avoid having a fall. Early diagnosis and treatment give you the best chance for managing medical conditions that are more common for people who are older than age 49.  Falls are a major cause of broken bones and head injuries in people who are older than age 16. Take precautions to prevent a fall at home.  Work with your health care provider to learn what changes you can make to improve your health and wellness and to prevent falls. This information is not intended to replace advice given to you by your health care provider. Make sure you discuss any questions you have with your health care provider. Document Revised: 03/15/2019 Document Reviewed: 10/05/2017 Elsevier Patient Education  2020 [employer].

## 2020-03-10 NOTE — Progress Notes (Signed)
Christina Sellers May 30, 1941 [id number]    History:    Presents for breast and pelvic exam.  No GYN complaints continues with chronic back and foot pain which is interfering with quality of life.  Primary care manages hypercholesteremia, diabetes and hypothyroidism.  Normal Pap and mammogram history.  2019 T score -1.8 stable Prolia per primary care.  Has had Pneumovax.  2017 - colon polyp.  Has had the Covid vaccine.  Long-term history of labial fusion.  Not sexually active.  Past medical history, past surgical history, family history and social history were all reviewed and documented in the EPIC chart.  Son lives local and is helpful, 1 daughter lives in [place] the other daughter lives in [place].  ROS:  A ROS was performed and pertinent positives and negatives are included.  Exam:  Vitals:   03/10/20 1443  BP: (!) 180/78  Weight: 136 lb (61.7 kg)  Height: 4\' 11"  (1.499 m)   Body mass index is 27.47 kg/m.   General appearance:  Normal Thyroid:  Symmetrical, normal in size, without palpable masses or nodularity. Respiratory  Auscultation:  Clear without wheezing or rhonchi Cardiovascular  Auscultation:  Regular rate, without rubs, murmurs or gallops  Edema/varicosities:  Not grossly evident Abdominal  Soft,nontender, without masses, guarding or rebound.  Liver/spleen:  No organomegaly noted  Hernia:  None appreciated  Skin  Inspection:  Grossly normal   Breasts: Examined lying and sitting.     Right: Without masses, retractions, discharge or axillary adenopathy.     Left: Without masses, retractions, discharge or axillary adenopathy. Gentitourinary   Inguinal/mons:  Normal without inguinal adenopathy  External genitalia: Labial fusion   BUS/Urethra/Skene's glands:  Normal  Vagina: Atrophic   Cervix:  Normal  Uterus:  normal in size, shape and contour.  Midline and mobile  Adnexa/parametria:     Rt: Without masses or tenderness.   Lt: Without masses or  tenderness.  Anus and perineum: Normal  Digital rectal exam: Normal sphincter tone without palpated masses or tenderness  Assessment/Plan:  79 y.o. W WF G2, P3 for breast and pelvic exam with no GYN complaints.  Postmenopausal on no HRT with no bleeding Labial fusion asymptomatic History of lichen sclerosis-remission Constipation Hypercholesteremia, diabetes, hypothyroidism, osteoporosis on Prolia  -primary care manages labs and meds Biggest problem is chronic back and foot pain  Plan: Blood pressure elevated 180/78 with retake 166/80 states takes Maxide only if feet are swollen, aware blood pressure elevated today will follow up with primary care, reviewed may need to take medication daily.  Congratulated on sustained weight loss has lost over 20 pounds with diagnosis of diabetes last year.  Shingrex reviewed and encouraged, encouraged to wait several months after completing Covid vaccine.  Continue over-the-counter A&D ointment externally currently having no irritation.  Continue to place small amount of estradiol vaginal cream at labial opening several times weekly.  Report any problems with urination.  Constipation reviewed encouraged MiraLAX, reviewed taking daily until bowel movements are regular and then taking every other day as needed.  Continue drinking plenty of water and fiber rich foods in diet.  Home safety, fall prevention discussed encouraged chair yoga.  Pap screening guidelines reviewed...      [place], 3:13 PM 03/10/2020

## 2020-03-12 LAB — URINALYSIS, COMPLETE W/RFL CULTURE
Bacteria, UA: NONE SEEN /HPF
Bilirubin Urine: NEGATIVE
Glucose, UA: NEGATIVE
Hgb urine dipstick: NEGATIVE
Ketones, ur: NEGATIVE
Nitrites, Initial: NEGATIVE
Specific Gravity, Urine: 1.031 (ref 1.001–1.03)
pH: 5.5 (ref 5.0–8.0)

## 2020-03-12 LAB — URINE CULTURE
MICRO NUMBER:: 10331753
SPECIMEN QUALITY:: ADEQUATE

## 2020-03-12 LAB — CULTURE INDICATED

## 2020-03-17 ENCOUNTER — Other Ambulatory Visit: Payer: Self-pay | Admitting: Internal Medicine

## 2020-03-17 DIAGNOSIS — I10 Essential (primary) hypertension: Secondary | ICD-10-CM

## 2020-03-19 ENCOUNTER — Other Ambulatory Visit: Payer: Self-pay

## 2020-03-19 ENCOUNTER — Ambulatory Visit: Payer: Medicare Other | Admitting: Podiatry

## 2020-03-19 ENCOUNTER — Encounter: Payer: Self-pay | Admitting: Podiatry

## 2020-03-19 VITALS — Temp 97.7°F

## 2020-03-19 DIAGNOSIS — M2042 Other hammer toe(s) (acquired), left foot: Secondary | ICD-10-CM

## 2020-03-19 DIAGNOSIS — L84 Corns and callosities: Secondary | ICD-10-CM | POA: Diagnosis not present

## 2020-03-20 NOTE — Progress Notes (Signed)
Subjective:   Patient ID: Christina Sellers, female   DOB: 79 y.o.   MRN: [id number]   HPI Patient presents with significant lesion plantar aspect left foot and also has digital deformities bilateral that she has had surgery on 1 and is wondering about the others   ROS      Objective:  Physical Exam  Neurovascular status intact with plantar keratotic lesion plantar left that are painful when pressed with patient noted to have hammertoe of the lesser digits left with history of correction second toe     Assessment:  Chronic lesion formation plantar aspect left with hammertoe deformity of the lesser digits     Plan:  Reviewed both conditions today sterile sharp debridement of lesions accomplished no iatrogenic bleeding and for the toes I discussed digital procedures to straighten the toes but I do not recommend them currently and would rather trim as needed

## 2020-03-24 ENCOUNTER — Other Ambulatory Visit: Payer: Self-pay

## 2020-03-24 ENCOUNTER — Encounter: Payer: Self-pay | Admitting: Nurse Practitioner

## 2020-03-24 ENCOUNTER — Ambulatory Visit: Payer: Medicare Other | Admitting: Nurse Practitioner

## 2020-03-24 VITALS — BP 160/82

## 2020-03-24 DIAGNOSIS — R300 Dysuria: Secondary | ICD-10-CM

## 2020-03-24 DIAGNOSIS — N898 Other specified noninflammatory disorders of vagina: Secondary | ICD-10-CM

## 2020-03-24 LAB — WET PREP FOR TRICH, YEAST, CLUE

## 2020-03-24 NOTE — Progress Notes (Signed)
History: 79 y.o. WF G2P3 presents for vaginal discomfort that began 3 days ago. C/o vaginal itching on labia/clitoris last night that has resolved and has some burning with urination at times. Denies vaginal discharge or odor. Denies hematuria and flank pain. Hx of lichen sclerosis, in remission. BP elevated last visit and today, taking Maxzide 25 mg daily. Says PCP is aware and they are working on managing this.   Exam: Appears well, no acute distress Cardiac: RRR Externa genitalia: fused left labia, no erythema, lesions, or discharge Vagina: atrophy, no discharge, or erythema Digital exam: no tenderness, no masses felt  UA: 1+ leukocytes, trace blood Wet prep negative  Assessment: Vaginal itching Dysuria Essential Hypertension- Maxzide 25 mg daily, managed by PCp  Plan: UA culture pending. Symptoms have resolved so no treatment at this time. Follow up as needed if symptoms return.

## 2020-03-25 ENCOUNTER — Ambulatory Visit
Admission: RE | Admit: 2020-03-25 | Discharge: 2020-03-25 | Disposition: A | Discharge status: Home / Self Care | Payer: Medicare Other | Source: Ambulatory Visit | Attending: Internal Medicine | Admitting: Internal Medicine

## 2020-03-25 DIAGNOSIS — I10 Essential (primary) hypertension: Secondary | ICD-10-CM

## 2020-03-26 LAB — URINE CULTURE
MICRO NUMBER:: 10378850
SPECIMEN QUALITY:: ADEQUATE

## 2020-03-26 LAB — URINALYSIS, COMPLETE W/RFL CULTURE
Bilirubin Urine: NEGATIVE
Glucose, UA: NEGATIVE
Hyaline Cast: NONE SEEN /LPF
Ketones, ur: NEGATIVE
Nitrites, Initial: NEGATIVE
Specific Gravity, Urine: 1.02 (ref 1.001–1.03)
pH: 5.5 (ref 5.0–8.0)

## 2020-03-26 LAB — CULTURE INDICATED

## 2020-04-24 ENCOUNTER — Other Ambulatory Visit: Payer: Self-pay

## 2020-04-24 ENCOUNTER — Ambulatory Visit: Payer: Medicare Other | Admitting: Obstetrics & Gynecology

## 2020-04-24 DIAGNOSIS — R32 Unspecified urinary incontinence: Secondary | ICD-10-CM

## 2020-04-24 DIAGNOSIS — R102 Pelvic and perineal pain: Secondary | ICD-10-CM | POA: Diagnosis not present

## 2020-04-24 MED ORDER — NITROFURANTOIN MONOHYD MACRO 100 MG PO CAPS: 100.0000 mg | ORAL_CAPSULE | Freq: Two times a day (BID) | ORAL | 0 refills | Status: AC

## 2020-04-24 NOTE — Progress Notes (Signed)
    Christina Sellers Apr 11, 1941 [id number]        79 y.o.  G2P2003   RP: Pelvic pain and leaking urine x yesterday  HPI: Pelvic pain and leaking urine x yesterday.  No burning when passing urine.  No blood in urine.  No fever.   OB History  Gravida Para Term Preterm AB Living  2 2 2     3   SAB TAB Ectopic Multiple Live Births        1      # Outcome Date GA Lbr Len/2nd Weight Sex Delivery Anes PTL Lv  2 Term           1 Term             Past medical history,surgical history, problem list, medications, allergies, family history and social history were all reviewed and documented in the EPIC chart.   Directed ROS with pertinent positives and negatives documented in the history of present illness/assessment and plan.  Exam:  There were no vitals filed for this visit. General appearance:  Normal  CVAT Negative bilaterally  Abdomen: Normal  Gynecologic exam: Vulva normal.  Bimanual exam:  Uterus AV, normal volume, NT, mobile.  No adnexal mass, NT.  U/A: Yellow cloudy, nitrites negative, white blood cells 10-20, red blood cells 20-40, many bacteria.  Urine culture pending.   Assessment/Plan:  79 y.o. G2P2003   1. Pelvic pain in female Probable acute cystitis.  Patient has many antibiotic allergies.  No allergies to Macrobid (only dizziness reported).  Decision to treat with Macrobid 1 tablet twice a day for 7 days.  Recommend pushing water intake.  Urine culture pending.  2. Incontinence of urine in female As above.  Other orders - nitrofurantoin, macrocrystal-monohydrate, (MACROBID) 100 MG capsule; Take 1 capsule (100 mg total) by mouth 2 (two) times daily for 7 days.  Princess Bruins MD, 3:35 PM 04/24/2020

## 2020-04-25 ENCOUNTER — Encounter: Payer: Self-pay | Admitting: Obstetrics & Gynecology

## 2020-04-25 NOTE — Patient Instructions (Signed)
  1. Pelvic pain in female Probable acute cystitis.  Patient has many antibiotic allergies.  No allergies to Macrobid (only dizziness reported).  Decision to treat with Macrobid 1 tablet twice a day for 7 days.  Recommend pushing water intake.  Urine culture pending.  2. Incontinence of urine in female As above.  Other orders - nitrofurantoin, macrocrystal-monohydrate, (MACROBID) 100 MG capsule; Take 1 capsule (100 mg total) by mouth 2 (two) times daily for 7 days.  Lexani, it was a pleasure seeing you today!  I will inform you of your results as soon as they are available.

## 2020-04-26 LAB — URINALYSIS, COMPLETE W/RFL CULTURE
Bilirubin Urine: NEGATIVE
Glucose, UA: NEGATIVE
Hyaline Cast: NONE SEEN /LPF
Ketones, ur: NEGATIVE
Nitrites, Initial: NEGATIVE
Specific Gravity, Urine: 1.015 (ref 1.001–1.03)
pH: 5.5 (ref 5.0–8.0)

## 2020-04-26 LAB — URINE CULTURE
MICRO NUMBER:: 10501001
SPECIMEN QUALITY:: ADEQUATE

## 2020-04-26 LAB — CULTURE INDICATED

## 2020-07-15 ENCOUNTER — Other Ambulatory Visit: Payer: Self-pay

## 2020-07-15 ENCOUNTER — Ambulatory Visit: Payer: Medicare Other | Admitting: Nurse Practitioner

## 2020-07-15 ENCOUNTER — Encounter: Payer: Self-pay | Admitting: Nurse Practitioner

## 2020-07-15 VITALS — BP 142/80

## 2020-07-15 DIAGNOSIS — R350 Frequency of micturition: Secondary | ICD-10-CM

## 2020-07-15 DIAGNOSIS — N3000 Acute cystitis without hematuria: Secondary | ICD-10-CM | POA: Diagnosis not present

## 2020-07-15 MED ORDER — FOSFOMYCIN TROMETHAMINE 3 G PO PACK: 3.0000 g | PACK | Freq: Once | ORAL | 0 refills | Status: AC

## 2020-07-15 NOTE — Progress Notes (Signed)
   Acute Office Visit  Subjective:    Patient ID: Christina Sellers, female    DOB: August 08, 1941, Christina y.o.   MRN: [id number]   HPI Christina y.o. presents today for urinary urgency and frequency that started a few days ago. Denies vaginal itching, discharge, or odor. Denies    Review of Systems  Constitutional: Negative.   Gastrointestinal: Negative.   Genitourinary: Positive for frequency and urgency. Negative for difficulty urinating, dysuria, flank pain, hematuria, vaginal bleeding and vaginal pain.       Objective:    Physical Exam Constitutional:      Appearance: Normal appearance.  Abdominal:     Tenderness: There is no right CVA tenderness or left CVA tenderness.     BP (!) 142/80 (BP Location: Right Arm, Patient Position: Sitting, Cuff Size: Normal)  Wt Readings from Last 3 Encounters:  03/10/20 136 lb (61.7 kg)  01/07/20 143 lb (64.9 kg)  05/13/19 146 lb (Christina.2 kg)   UA: Leukocytes 1+, protein 1+, wbc 10-20, rbc none, moderate bacteria      Assessment & Plan:   Problem List Items Addressed This Visit      Other   Low back pain - Primary   Relevant Orders   Urinalysis,Complete w/RFL Culture    Other Visit Diagnoses    Acute cystitis without hematuria       Relevant Medications   fosfomycin (MONUROL) 3 g PACK      Plan: Will treat empirically with Fosfomycin 3g once. She has many allergies so we are limited. Urine culture pending. Increase fluid intake. Will return if symptoms worsen or do not improve.     Tamela Gammon [facility], 4:06 PM 07/15/2020

## 2020-07-15 NOTE — Patient Instructions (Signed)
Urinary Tract Infection, Adult A urinary tract infection (UTI) is an infection of any part of the urinary tract. The urinary tract includes:  The kidneys.  The ureters.  The bladder.  The urethra. These organs make, store, and get rid of pee (urine) in the body. What are the causes? This is caused by germs (bacteria) in your genital area. These germs grow and cause swelling (inflammation) of your urinary tract. What increases the risk? You are more likely to develop this condition if:  You have a small, thin tube (catheter) to drain pee.  You cannot control when you pee or poop (incontinence).  You are female, and: ? You use these methods to prevent pregnancy:  A medicine that kills sperm (spermicide).  A device that blocks sperm (diaphragm). ? You have low levels of a female hormone (estrogen). ? You are pregnant.  You have genes that add to your risk.  You are sexually active.  You take antibiotic medicines.  You have trouble peeing because of: ? A prostate that is bigger than normal, if you are female. ? A blockage in the part of your body that drains pee from the bladder (urethra). ? A kidney stone. ? A nerve condition that affects your bladder (neurogenic bladder). ? Not getting enough to drink. ? Not peeing often enough.  You have other conditions, such as: ? Diabetes. ? A weak disease-fighting system (immune system). ? Sickle cell disease. ? Gout. ? Injury of the spine. What are the signs or symptoms? Symptoms of this condition include:  Needing to pee right away (urgently).  Peeing often.  Peeing small amounts often.  Pain or burning when peeing.  Blood in the pee.  Pee that smells bad or not like normal.  Trouble peeing.  Pee that is cloudy.  Fluid coming from the vagina, if you are female.  Pain in the belly or lower back. Other symptoms include:  Throwing up (vomiting).  No urge to eat.  Feeling mixed up (confused).  Being tired  and grouchy (irritable).  A fever.  Watery poop (diarrhea). How is this treated? This condition may be treated with:  Antibiotic medicine.  Other medicines.  Drinking enough water. Follow these instructions at home:  Medicines  Take over-the-counter and prescription medicines only as told by your doctor.  If you were prescribed an antibiotic medicine, take it as told by your doctor. Do not stop taking it even if you start to feel better. General instructions  Make sure you: ? Pee until your bladder is empty. ? Do not hold pee for a long time. ? Empty your bladder after sex. ? Wipe from front to back after pooping if you are a female. Use each tissue one time when you wipe.  Drink enough fluid to keep your pee pale yellow.  Keep all follow-up visits as told by your doctor. This is important. Contact a doctor if:  You do not get better after 1-2 days.  Your symptoms go away and then come back. Get help right away if:  You have very bad back pain.  You have very bad pain in your lower belly.  You have a fever.  You are sick to your stomach (nauseous).  You are throwing up. Summary  A urinary tract infection (UTI) is an infection of any part of the urinary tract.  This condition is caused by germs in your genital area.  There are many risk factors for a UTI. These include having a small, thin   tube to drain pee and not being able to control when you pee or poop.  Treatment includes antibiotic medicines for germs.  Drink enough fluid to keep your pee pale yellow. This information is not intended to replace advice given to you by your health care provider. Make sure you discuss any questions you have with your health care provider. Document Revised: 11/09/2018 Document Reviewed: 06/01/2018 Elsevier Patient Education  2020 [employer]  

## 2020-07-17 LAB — URINALYSIS, COMPLETE W/RFL CULTURE
Bilirubin Urine: NEGATIVE
Glucose, UA: NEGATIVE
Hgb urine dipstick: NEGATIVE
Nitrites, Initial: NEGATIVE
RBC / HPF: NONE SEEN /HPF (ref 0–2)
Specific Gravity, Urine: 1.02 (ref 1.001–1.03)
pH: 5.5 (ref 5.0–8.0)

## 2020-07-17 LAB — URINE CULTURE
MICRO NUMBER:: 10808348
SPECIMEN QUALITY:: ADEQUATE

## 2020-07-17 LAB — CULTURE INDICATED

## 2020-07-31 ENCOUNTER — Encounter: Payer: Self-pay | Admitting: Podiatry

## 2020-07-31 ENCOUNTER — Ambulatory Visit: Payer: Medicare Other | Admitting: Podiatry

## 2020-07-31 ENCOUNTER — Other Ambulatory Visit: Payer: Self-pay

## 2020-07-31 DIAGNOSIS — L84 Corns and callosities: Secondary | ICD-10-CM

## 2020-07-31 DIAGNOSIS — M779 Enthesopathy, unspecified: Secondary | ICD-10-CM

## 2020-07-31 DIAGNOSIS — M778 Other enthesopathies, not elsewhere classified: Secondary | ICD-10-CM

## 2020-08-01 NOTE — Progress Notes (Signed)
Subjective:   Patient ID: Christina Sellers, female   DOB: 79 y.o.   MRN: [id number]   HPI Patient presents with a lot of inflammation around the fifth metatarsal head left with fluid buildup and keratotic lesion formation that is painful   ROS      Objective:  Physical Exam  Inflammatory capsulitis fifth MPJ left with keratotic plantar lesion     Assessment:  Pain due to pressure with fluid buildup with lesion secondary to bone structure     Plan:  H&P education rendered sterile prep done and injected the fifth MPJ plantar 3 mg dexamethasone 5 mg Xylocaine sterile debridement accomplished fifth and third metatarsal heads no iatrogenic bleeding and reappoint for routine care

## 2020-09-03 ENCOUNTER — Other Ambulatory Visit: Payer: Self-pay

## 2020-09-03 ENCOUNTER — Ambulatory Visit: Payer: Medicare Other | Admitting: Nurse Practitioner

## 2020-09-03 ENCOUNTER — Encounter: Payer: Self-pay | Admitting: Nurse Practitioner

## 2020-09-03 VITALS — BP 148/80 | Ht <= 58 in

## 2020-09-03 DIAGNOSIS — R300 Dysuria: Secondary | ICD-10-CM

## 2020-09-03 DIAGNOSIS — N3000 Acute cystitis without hematuria: Secondary | ICD-10-CM | POA: Diagnosis not present

## 2020-09-03 MED ORDER — CEFDINIR 300 MG PO CAPS: 300.0000 mg | ORAL_CAPSULE | Freq: Two times a day (BID) | ORAL | 0 refills | Status: AC

## 2020-09-03 NOTE — Patient Instructions (Signed)
Urinary Tract Infection, Adult A urinary tract infection (UTI) is an infection of any part of the urinary tract. The urinary tract includes:  The kidneys.  The ureters.  The bladder.  The urethra. These organs make, store, and get rid of pee (urine) in the body. What are the causes? This is caused by germs (bacteria) in your genital area. These germs grow and cause swelling (inflammation) of your urinary tract. What increases the risk? You are more likely to develop this condition if:  You have a small, thin tube (catheter) to drain pee.  You cannot control when you pee or poop (incontinence).  You are female, and: ? You use these methods to prevent pregnancy:  A medicine that kills sperm (spermicide).  A device that blocks sperm (diaphragm). ? You have low levels of a female hormone (estrogen). ? You are pregnant.  You have genes that add to your risk.  You are sexually active.  You take antibiotic medicines.  You have trouble peeing because of: ? A prostate that is bigger than normal, if you are female. ? A blockage in the part of your body that drains pee from the bladder (urethra). ? A kidney stone. ? A nerve condition that affects your bladder (neurogenic bladder). ? Not getting enough to drink. ? Not peeing often enough.  You have other conditions, such as: ? Diabetes. ? A weak disease-fighting system (immune system). ? Sickle cell disease. ? Gout. ? Injury of the spine. What are the signs or symptoms? Symptoms of this condition include:  Needing to pee right away (urgently).  Peeing often.  Peeing small amounts often.  Pain or burning when peeing.  Blood in the pee.  Pee that smells bad or not like normal.  Trouble peeing.  Pee that is cloudy.  Fluid coming from the vagina, if you are female.  Pain in the belly or lower back. Other symptoms include:  Throwing up (vomiting).  No urge to eat.  Feeling mixed up (confused).  Being tired  and grouchy (irritable).  A fever.  Watery poop (diarrhea). How is this treated? This condition may be treated with:  Antibiotic medicine.  Other medicines.  Drinking enough water. Follow these instructions at home:  Medicines  Take over-the-counter and prescription medicines only as told by your doctor.  If you were prescribed an antibiotic medicine, take it as told by your doctor. Do not stop taking it even if you start to feel better. General instructions  Make sure you: ? Pee until your bladder is empty. ? Do not hold pee for a long time. ? Empty your bladder after sex. ? Wipe from front to back after pooping if you are a female. Use each tissue one time when you wipe.  Drink enough fluid to keep your pee pale yellow.  Keep all follow-up visits as told by your doctor. This is important. Contact a doctor if:  You do not get better after 1-2 days.  Your symptoms go away and then come back. Get help right away if:  You have very bad back pain.  You have very bad pain in your lower belly.  You have a fever.  You are sick to your stomach (nauseous).  You are throwing up. Summary  A urinary tract infection (UTI) is an infection of any part of the urinary tract.  This condition is caused by germs in your genital area.  There are many risk factors for a UTI. These include having a small, thin   tube to drain pee and not being able to control when you pee or poop.  Treatment includes antibiotic medicines for germs.  Drink enough fluid to keep your pee pale yellow. This information is not intended to replace advice given to you by your health care provider. Make sure you discuss any questions you have with your health care provider. Document Revised: 11/09/2018 Document Reviewed: 06/01/2018 Elsevier Patient Education  2020 [employer]  

## 2020-09-03 NOTE — Progress Notes (Signed)
   Acute Office Visit  Subjective:    Patient ID: Christina Sellers, female    DOB: 10-May-1941, 79 y.o.   MRN: [id number]   HPI 79 y.o. presents today for urinary frequency and "feeling off" and says this is usually how she feels when she has a UTI. Denies urgency, hematuria, dysuria, or vaginal symptoms. Did wake up sweating over night but denies fever or chills.    Review of Systems  Constitutional: Positive for diaphoresis and fatigue. Negative for chills and fever.  Genitourinary: Positive for frequency. Negative for difficulty urinating, dysuria, hematuria and urgency.       Objective:    Physical Exam Constitutional:      Appearance: Normal appearance.  Genitourinary:    General: Normal vulva.     BP (!) 148/80 (BP Location: Right Arm, Patient Position: Sitting, Cuff Size: Normal)   Ht 4\' 10"  (1.473 m)   BMI 28.42 kg/m  Wt Readings from Last 3 Encounters:  03/10/20 136 lb (61.7 kg)  01/07/20 143 lb (64.9 kg)  05/13/19 146 lb (66.2 kg)        Assessment & Plan:   Problem List Items Addressed This Visit    None    Visit Diagnoses    Acute cystitis without hematuria    -  Primary   Relevant Medications   cefdinir (OMNICEF) 300 MG capsule   Dysuria       Relevant Orders   Urinalysis,Complete w/RFL Culture      Plan: UA shows probable UTI.  She has many allergies so we are limited on what we can use.  Was treated with fosfomycin 07/15/2020 but says this was expensive.  So we will do Cefdinir 300 mg twice a day for 5 days.  Increase water intake.  Urine culture pending.  Follow-up if symptoms worsen or do not improve.  She is agreeable to plan.    Tamela Gammon [facility], 11:44 AM 09/03/2020

## 2020-09-06 LAB — URINALYSIS, COMPLETE W/RFL CULTURE
Bilirubin Urine: NEGATIVE
Glucose, UA: NEGATIVE
Hgb urine dipstick: NEGATIVE
Hyaline Cast: NONE SEEN /LPF
Ketones, ur: NEGATIVE
Nitrites, Initial: NEGATIVE
RBC / HPF: NONE SEEN /HPF (ref 0–2)
Specific Gravity, Urine: 1.02 (ref 1.001–1.03)
pH: 6 (ref 5.0–8.0)

## 2020-09-06 LAB — URINE CULTURE
MICRO NUMBER:: 11015307
SPECIMEN QUALITY:: ADEQUATE

## 2020-09-06 LAB — CULTURE INDICATED

## 2020-09-09 ENCOUNTER — Ambulatory Visit (INDEPENDENT_AMBULATORY_CARE_PROVIDER_SITE_OTHER): Payer: Medicare Other | Admitting: Obstetrics & Gynecology

## 2020-09-09 ENCOUNTER — Encounter: Payer: Self-pay | Admitting: Obstetrics & Gynecology

## 2020-09-09 ENCOUNTER — Other Ambulatory Visit: Payer: Self-pay

## 2020-09-09 VITALS — BP 136/80

## 2020-09-09 DIAGNOSIS — R300 Dysuria: Secondary | ICD-10-CM

## 2020-09-09 DIAGNOSIS — Q525 Fusion of labia: Secondary | ICD-10-CM

## 2020-09-09 DIAGNOSIS — N904 Leukoplakia of vulva: Secondary | ICD-10-CM

## 2020-09-09 DIAGNOSIS — N898 Other specified noninflammatory disorders of vagina: Secondary | ICD-10-CM

## 2020-09-09 LAB — WET PREP FOR TRICH, YEAST, CLUE

## 2020-09-09 MED ORDER — CLOBETASOL PROPIONATE 0.05 % EX OINT: 1.0000 "application " | TOPICAL_OINTMENT | Freq: Every day | CUTANEOUS | 4 refills | Status: AC

## 2020-09-09 NOTE — Progress Notes (Signed)
    Pearlene Teat 09-17-1941 [id number]        79 y.o.  G2P2003   RP: Vaginal d/c with vulvar irritation  HPI: Labial fusion.  C/O increased vaginal d/c and vulvar irritation worsened recently.  Some burning with urination, no other UTI Sx.  No fever.   OB History  Gravida Para Term Preterm AB Living  2 2 2     3   SAB TAB Ectopic Multiple Live Births        1      # Outcome Date GA Lbr Len/2nd Weight Sex Delivery Anes PTL Lv  2 Term           1 Term             Past medical history,surgical history, problem list, medications, allergies, family history and social history were all reviewed and documented in the EPIC chart.   Directed ROS with pertinent positives and negatives documented in the history of present illness/assessment and plan.  Exam:  Vitals:   09/09/20 1520  BP: 136/80   General appearance:  Normal   Gynecologic exam: Very atrophic vulva, no labia minora left, with labial fusion anteriorly (1 finger breathe opening).  Clear vaginal d/c.  Wet prep done.  Wet prep negative U/A: Insufficient specimen   Assessment/Plan:  78 y.o. G2P2003   1. Lichen sclerosus et atrophicus of the vulva Clobetasol ointment to restart daily x 2 weeks, then decrease to twice a week long term.  2. Labial fusion As above.  3. Vaginal discharge Wet prep negative, patient reassured. - WET PREP FOR [place], YEAST, CLUE  4. Dysuria Insufficient urine for U/A.  Symptoms probably associated with vulvar irritation with contact of urine.   - Urinalysis,Complete w/RFL Culture  Other orders - clobetasol ointment (TEMOVATE) 0.05 %; Apply 1 application topically daily for 14 days. Thin vulvar application daily x 14 days, then wean to twice a week long term use.  Princess Bruins MD, 3:55 PM 09/09/2020

## 2020-09-10 LAB — URINE CULTURE
MICRO NUMBER:: 11033352
SPECIMEN QUALITY:: ADEQUATE

## 2020-09-12 ENCOUNTER — Encounter: Payer: Self-pay | Admitting: Obstetrics & Gynecology

## 2020-11-19 ENCOUNTER — Ambulatory Visit (INDEPENDENT_AMBULATORY_CARE_PROVIDER_SITE_OTHER): Payer: Medicare Other | Admitting: Podiatry

## 2020-11-19 ENCOUNTER — Encounter: Payer: Self-pay | Admitting: Podiatry

## 2020-11-19 ENCOUNTER — Other Ambulatory Visit: Payer: Self-pay

## 2020-11-19 DIAGNOSIS — M21619 Bunion of unspecified foot: Secondary | ICD-10-CM | POA: Diagnosis not present

## 2020-11-19 DIAGNOSIS — L84 Corns and callosities: Secondary | ICD-10-CM

## 2020-11-21 NOTE — Progress Notes (Signed)
Subjective:   Patient ID: Christina Sellers, female   DOB: 79 y.o.   MRN: [id number]   HPI Patient presents stating that these corns are very sore and her digits bother her and she is not sure of eventually whether surgery will be necessary   ROS      Objective:  Physical Exam  Neurovascular status intact with continuation of chronic discomfort plantar aspect left over right foot with deep lesion formation distal to the metatarsal head #2     Assessment:  Chronic discomfort with the structure of the foot a big part of the problem with lesion formation     Plan:  H&P reviewed condition and debridement accomplished today with no iatrogenic bleeding.  Discussed again digital formation and the fact it is worsening we discussed possible metatarsal head resection or forefoot reconstruction but due to the overwhelming difficulty of this we continue to defer this type of treatment

## 2020-12-09 DIAGNOSIS — M62830 Muscle spasm of back: Secondary | ICD-10-CM | POA: Diagnosis not present

## 2020-12-09 DIAGNOSIS — R7989 Other specified abnormal findings of blood chemistry: Secondary | ICD-10-CM | POA: Diagnosis not present

## 2020-12-25 DIAGNOSIS — M1611 Unilateral primary osteoarthritis, right hip: Secondary | ICD-10-CM | POA: Diagnosis not present

## 2020-12-27 DIAGNOSIS — J449 Chronic obstructive pulmonary disease, unspecified: Secondary | ICD-10-CM | POA: Diagnosis not present

## 2020-12-27 DIAGNOSIS — M810 Age-related osteoporosis without current pathological fracture: Secondary | ICD-10-CM | POA: Diagnosis not present

## 2020-12-27 DIAGNOSIS — E1136 Type 2 diabetes mellitus with diabetic cataract: Secondary | ICD-10-CM | POA: Diagnosis not present

## 2020-12-27 DIAGNOSIS — I10 Essential (primary) hypertension: Secondary | ICD-10-CM | POA: Diagnosis not present

## 2020-12-27 DIAGNOSIS — E7800 Pure hypercholesterolemia, unspecified: Secondary | ICD-10-CM | POA: Diagnosis not present

## 2020-12-27 DIAGNOSIS — N183 Chronic kidney disease, stage 3 unspecified: Secondary | ICD-10-CM | POA: Diagnosis not present

## 2020-12-27 DIAGNOSIS — K219 Gastro-esophageal reflux disease without esophagitis: Secondary | ICD-10-CM | POA: Diagnosis not present

## 2020-12-27 DIAGNOSIS — E039 Hypothyroidism, unspecified: Secondary | ICD-10-CM | POA: Diagnosis not present

## 2020-12-27 DIAGNOSIS — M159 Polyosteoarthritis, unspecified: Secondary | ICD-10-CM | POA: Diagnosis not present

## 2020-12-29 DIAGNOSIS — I700 Atherosclerosis of aorta: Secondary | ICD-10-CM | POA: Diagnosis not present

## 2020-12-29 DIAGNOSIS — R61 Generalized hyperhidrosis: Secondary | ICD-10-CM | POA: Diagnosis not present

## 2020-12-29 DIAGNOSIS — I10 Essential (primary) hypertension: Secondary | ICD-10-CM | POA: Diagnosis not present

## 2020-12-29 DIAGNOSIS — M109 Gout, unspecified: Secondary | ICD-10-CM | POA: Diagnosis not present

## 2020-12-29 DIAGNOSIS — E559 Vitamin D deficiency, unspecified: Secondary | ICD-10-CM | POA: Diagnosis not present

## 2020-12-29 DIAGNOSIS — D519 Vitamin B12 deficiency anemia, unspecified: Secondary | ICD-10-CM | POA: Diagnosis not present

## 2020-12-29 DIAGNOSIS — Z0000 Encounter for general adult medical examination without abnormal findings: Secondary | ICD-10-CM | POA: Diagnosis not present

## 2020-12-29 DIAGNOSIS — E039 Hypothyroidism, unspecified: Secondary | ICD-10-CM | POA: Diagnosis not present

## 2020-12-29 DIAGNOSIS — R7989 Other specified abnormal findings of blood chemistry: Secondary | ICD-10-CM | POA: Diagnosis not present

## 2020-12-29 DIAGNOSIS — E1136 Type 2 diabetes mellitus with diabetic cataract: Secondary | ICD-10-CM | POA: Diagnosis not present

## 2020-12-29 DIAGNOSIS — M79604 Pain in right leg: Secondary | ICD-10-CM | POA: Diagnosis not present

## 2020-12-29 DIAGNOSIS — R6883 Chills (without fever): Secondary | ICD-10-CM | POA: Diagnosis not present

## 2020-12-29 DIAGNOSIS — E538 Deficiency of other specified B group vitamins: Secondary | ICD-10-CM | POA: Diagnosis not present

## 2020-12-30 ENCOUNTER — Telehealth: Payer: Self-pay

## 2020-12-30 NOTE — Telephone Encounter (Signed)
NOTES ON FILE FROM [facility] [phone]

## 2021-01-02 DIAGNOSIS — M810 Age-related osteoporosis without current pathological fracture: Secondary | ICD-10-CM | POA: Diagnosis not present

## 2021-01-08 DIAGNOSIS — Z1211 Encounter for screening for malignant neoplasm of colon: Secondary | ICD-10-CM | POA: Diagnosis not present

## 2021-01-14 DIAGNOSIS — M1611 Unilateral primary osteoarthritis, right hip: Secondary | ICD-10-CM | POA: Diagnosis not present

## 2021-01-21 ENCOUNTER — Encounter: Payer: Self-pay | Admitting: Internal Medicine

## 2021-01-21 ENCOUNTER — Ambulatory Visit: Payer: Medicare Other | Admitting: Internal Medicine

## 2021-01-21 ENCOUNTER — Other Ambulatory Visit: Payer: Self-pay

## 2021-01-21 VITALS — BP 150/64 | HR 65 | Ht <= 58 in | Wt 135.0 lb

## 2021-01-21 DIAGNOSIS — I152 Hypertension secondary to endocrine disorders: Secondary | ICD-10-CM | POA: Diagnosis not present

## 2021-01-21 DIAGNOSIS — E1169 Type 2 diabetes mellitus with other specified complication: Secondary | ICD-10-CM | POA: Insufficient documentation

## 2021-01-21 DIAGNOSIS — E1159 Type 2 diabetes mellitus with other circulatory complications: Secondary | ICD-10-CM

## 2021-01-21 DIAGNOSIS — Z01810 Encounter for preprocedural cardiovascular examination: Secondary | ICD-10-CM | POA: Insufficient documentation

## 2021-01-21 DIAGNOSIS — I700 Atherosclerosis of aorta: Secondary | ICD-10-CM | POA: Insufficient documentation

## 2021-01-21 DIAGNOSIS — E785 Hyperlipidemia, unspecified: Secondary | ICD-10-CM | POA: Diagnosis not present

## 2021-01-21 DIAGNOSIS — R600 Localized edema: Secondary | ICD-10-CM | POA: Insufficient documentation

## 2021-01-21 DIAGNOSIS — I10 Essential (primary) hypertension: Secondary | ICD-10-CM | POA: Insufficient documentation

## 2021-01-21 NOTE — Patient Instructions (Signed)
Medication Instructions:  Your physician recommends that you continue on your current medications as directed. Please refer to the Current Medication list given to you today.  *If you need a refill on your cardiac medications before your next appointment, please call your pharmacy*   Lab Work: TODAY: BNP If you have labs (blood work) drawn today and your tests are completely normal, you will receive your results only by: Marland Kitchen MyChart Message (if you have MyChart) OR . A paper copy in the mail If you have any lab test that is abnormal or we need to change your treatment, we will call you to review the results.   Testing/Procedures: Your physician has requested that you have an echocardiogram. Echocardiography is a painless test that uses sound waves to create images of your heart. It provides your doctor with information about the size and shape of your heart and how well your heart's chambers and valves are working. This procedure takes approximately one hour. There are no restrictions for this procedure.     Follow-Up: At [facility], you and your health needs are our priority.  As part of our continuing mission to provide you with exceptional heart care, we have created designated Provider Care Teams.  These Care Teams include your primary Cardiologist (physician) and Advanced Practice Providers (APPs -  Physician Assistants and Nurse Practitioners) who all work together to provide you with the care you need, when you need it.  We recommend signing up for the patient portal called "MyChart".  Sign up information is provided on this After Visit Summary.  MyChart is used to connect with patients for Virtual Visits (Telemedicine).  Patients are able to view lab/test results, encounter notes, upcoming appointments, etc.  Non-urgent messages can be sent to your provider as well.   To learn more about what you can do with MyChart, go to [email or web address].    Your next appointment:   5  month(s)  The format for your next appointment:   In Person  Provider:   You may see Gasper Sells, MD or one of the following Advanced Practice Providers on your designated Care Team:    Melina Copa, PA-C  Ermalinda Barrios, PA-C    Other Instructions   Please check your BP 3 times per week.

## 2021-01-21 NOTE — Progress Notes (Signed)
Cardiology Office Note:    Date:  01/21/2021   ID:  Christina Sellers, DOB 03/22/1941, MRN [id number]  PCP:  Josetta Huddle, [place]  Cardiologist:  No primary care provider on file.  Advanced Practice Provider:  No care team member to display Electrophysiologist:  None       Referring MD: Josetta Huddle, MD  CC: Cardiac Risk Stratification Consulted for the evaluation of Cardiac risk stratification at the behest of Josetta Huddle, MD R Hip Surgery: Unclear anesthesia; General assumed  History of Present Illness:    Christina Sellers is a 80 y.o. female with a hx of PAD (CAS), hx of COPD with Asthma, HLD with T2DM with aortic atherosclerosis, HTN with DM who presents for evaluation 01/21/21.  Patient notes that she is feeling back pain.  She has been having back and hip pain since August.  Patient used to travel the world by herself three or four years ago.  Has had no chest pain, chest pressure, chest tightness, chest stinging.  Able to go to the grocery store and can go do her shopping by herself:  No CP, SOB, PND or orthopnea..  Patient exertion notable for shopping  and feels no symptoms.  No shortness of breath, DOE.  No PND or orthopnea.  No bendopnea, weight gain,but notes new leg swelling since the summer.  Started a diuretics with no issues (off diuretic).  Takes PRN diuretic and this had helped.    Ambulatory BP not done.  Past Medical History:  Diagnosis Date  . Abnormal Pap smear of vagina   . Allergic sinusitis   . Anxiety   . Atherosclerotic peripheral vascular disease ([place])   . Carotid artery stenosis   . Cellulitis   . COPD with asthma ([place])    not an issue now  . Depression   . Diabetes mellitus type 2, uncomplicated ([place])   . DJD (degenerative joint disease)   . Elevated cholesterol   . Elevated MCV    secondary to alchol  . GERD (gastroesophageal reflux disease)    not an issue now.  . Gout   . Hearing difficulty     bilateral hearing aids  . History of recurrent UTIs   . Hypertension    loss weight'off meds now"  . Hypothyroidism   . IBS (irritable bowel syndrome)   . Insomnia   . Labial fusion   . Osteoarthritis of left knee   . Osteopenia   . Recurrent vaginitis   . Sjogren's syndrome ([place])   . Syncope 11.7.14   secondary to dehydration and possible hypoglycemia  . Tendonitis   . Thyroid disease   . Transfusion history    age 52 "anemia"    Past Surgical History:  Procedure Laterality Date  . CATARACT EXTRACTION, BILATERAL Bilateral   . CHOLECYSTECTOMY     open  . COLONOSCOPY WITH PROPOFOL N/A 03/23/2016   Procedure: COLONOSCOPY WITH PROPOFOL;  Surgeon: Garlan Fair, MD;  Location: [facility] ENDOSCOPY;  Service: Endoscopy;  Laterality: N/A;  . EYE SURGERY    . FOOT SURGERY Left    retained hardware  . KNEE SURGERY Left    scope   . TONSILLECTOMY      Current Medications: Current Meds  Medication Sig  . acetaminophen (TYLENOL) 500 MG tablet Take 1,000 mg by mouth every 6 (six) hours as needed for mild pain.  . Ascorbic Acid (VITAMIN C) 500 MG CAPS Take 500 mg by mouth daily.  Marland Kitchen  betamethasone valerate ointment (VALISONE) 0.1 % Apply 1 application topically 2 (two) times daily.  . Calcium Citrate 250 MG TABS Take 250 mg by mouth 2 (two) times daily as needed.  . Cholecalciferol (VITAMIN D3) 1000 UNITS CAPS Take 1,000 mg by mouth daily.    . CRESTOR 10 MG tablet Take 10 mg by mouth as directed. Takes 10 mg Monday Wednesday and Friday,and 5 mg the rest of the week  . diclofenac sodium (VOLTAREN) 1 % GEL   . fluorometholone (FML) 0.1 % ophthalmic suspension   . fluticasone (FLONASE) 50 MCG/ACT nasal spray Place 1 spray into both nostrils daily.  Marland Kitchen levothyroxine (SYNTHROID, LEVOTHROID) 100 MCG tablet Take 1 tablet by mouth daily before breakfast.   . loratadine-pseudoephedrine (CLARITIN-D 12-HOUR) 5-120 MG tablet Take by mouth.  . meloxicam (MOBIC) 15 MG tablet meloxicam 15 mg tablet  .  metFORMIN (GLUCOPHAGE) 500 MG tablet Take 1 tablet by mouth daily.  Marland Kitchen nystatin cream (MYCOSTATIN) Apply 1 application topically 2 (two) times daily. Insert one applicatoful intravaginally hs x 3 nights.  . nystatin-triamcinolone ointment (MYCOLOG) Apply 1 application topically 2 (two) times daily.  . psyllium (HYDROCIL/METAMUCIL) 95 % PACK Take 1 packet by mouth daily.  . RESTASIS 0.05 % ophthalmic emulsion Place 1 drop into both eyes 2 (two) times daily.   Marland Kitchen terconazole (TERAZOL 7) 0.4 % vaginal cream Place 1 applicator vaginally at bedtime.  . triamterene-hydrochlorothiazide (MAXZIDE-25) 37.5-25 MG tablet Take 1 tablet by mouth daily.  . vitamin E 400 UNIT capsule Take 400 Units by mouth daily.       Allergies:   Celexa [citalopram hydrobromide], Fluconazole, Green tea leaf ext, Levaquin [levofloxacin in d5w], Losartan, Nitrofurantoin macrocrystal, Scopolamine, Sulfa antibiotics, Trimethoprim, and Triamcinolone acetonide   Social History   Socioeconomic History  . Marital status: Widowed    Spouse name: Not on file  . Number of children: Not on file  . Years of education: Not on file  . Highest education level: Not on file  Occupational History  . Not on file  Tobacco Use  . Smoking status: Former Smoker    Quit date: 02/21/1982    Years since quitting: 38.9  . Smokeless tobacco: Never Used  Vaping Use  . Vaping Use: Never used  Substance and Sexual Activity  . Alcohol use: No  . Drug use: No  . Sexual activity: Never    Birth control/protection: Post-menopausal  Other Topics Concern  . Not on file  Social History Narrative  . Not on file   Social Determinants of Health   Financial Resource Strain: Not on file  Food Insecurity: Not on file  Transportation Needs: Not on file  Physical Activity: Not on file  Stress: Not on file  Social Connections: Not on file     Family History: The patient's family history includes Breast cancer in her sister; Cancer in her brother  and father; Diabetes in her maternal aunt.  ROS:   Please see the history of present illness.     All other systems reviewed and are negative.  EKGs/Labs/Other Studies Reviewed:    The following studies were reviewed today:  EKG:   01/21/21: SR 65 WNL  Transthoracic Echocardiogram: Date: 06/23/2017 Results: Study Conclusions   - Left ventricle: The cavity size was normal. Wall thickness was  normal. Systolic function was normal. The estimated ejection  fraction was in the range of 55% to 60%. Wall motion was normal;  there were no regional wall motion abnormalities. Doppler  parameters are consistent with abnormal left ventricular  relaxation (grade 1 diastolic dysfunction).  - Mitral valve: Calcified annulus.  - Left atrium: The atrium was mildly dilated.  - Right atrium: The atrium was mildly dilated.  - Pulmonary arteries: Systolic pressure was mildly increased. PA  peak pressure: 31 mm Hg (S).   NM Stress Testing : Date: 04/14/2018 Results:  Nuclear stress EF: 77%.  The left ventricular ejection fraction is hyperdynamic (>65%).  There was no ST segment deviation noted during stress.  The study is normal.  This is a low risk study.   Normal pharmacologic nuclear stress test with no evidence for prior infarct or ischemia.  Hyperdynamic LVEF.  Recent Labs: No results found for requested labs within last 8760 hours.  Recent Lipid Panel    Component Value Date/Time   CHOL 149 08/26/2011 0400   TRIG 195 (H) 08/26/2011 0400   HDL 37 (L) 08/26/2011 0400   CHOLHDL 4.0 08/26/2011 0400   VLDL 39 08/26/2011 0400   LDLCALC 73 08/26/2011 0400    Risk Assessment/Calculations:     N/A  Physical Exam:    VS:  BP (!) 150/64   Pulse 65   Ht 4\' 10"  (1.473 m)   Wt 135 lb (61.2 kg)   SpO2 99%   BMI 28.22 kg/m     Wt Readings from Last 3 Encounters:  01/21/21 135 lb (61.2 kg)  03/10/20 136 lb (61.7 kg)  01/07/20 143 lb (64.9 kg)     GEN:  Well  nourished, well developed in no acute distress HEENT: Normal NECK: No JVD; No carotid bruits LYMPHATICS: No lymphadenopathy CARDIAC: RRR, no murmurs, rubs, gallops RESPIRATORY:  Clear to auscultation without rales, wheezing or rhonchi  ABDOMEN: Soft, non-tender, non-distended MUSCULOSKELETAL:  +2 edema bilaterally; No deformity  SKIN: Warm and dry NEUROLOGIC:  Alert and oriented x 3 PSYCHIATRIC:  Normal affect   ASSESSMENT:    1. Bilateral edema of lower extremity   2. Preoperative cardiovascular examination   3. Hypertension associated with diabetes ([place])   4. Hyperlipidemia associated with type 2 diabetes mellitus ([place])   5. Aortic atherosclerosis ([facility])    PLAN:    In order of problems listed above:  Preoperative Risk Assessment - The Revised Cardiac Risk Index = 0 (DM with no insulin) which equates to 0=0.4% estimated risk of perioperative myocardial infarction, pulmonary edema, ventricular fibrillation, cardiac arrest, or complete heart block.  - DASI score of 10.7 associated with 4.06 functional mets - No further cardiac testing is recommended prior to surgery.  - The patient may proceed to surgery at acceptable risk.    Lower extremity Edema (bilateral)  - without leg claudication or ulcer  - will get echocardiogram, BNP/NTproBNP - unclear if patient has taken any of the norvasc- will remove from her [facility]  - based on blood work, May transition thiazide to lasix  Diabetes with hypertension - ambulatory blood pressure not done, will start ambulatory BP monitoring; gave education on how to perform ambulatory blood pressure monitoring including the frequency and technique; goal ambulatory blood pressure < 135/85 on average - continue home medications with the exception of the above - discussed diet (DASH/low sodium), and exercise/weight loss interventions  (HLD with DM) Aortic atherosclerosis -LDL goal less than 70 -To avoid polypharmacy, will Recheck lipid profile LFTs  and may add new medication in subsequent visits - gave education on dietary changes  Post surgery follow up unless new symptoms or abnormal test results warranting change in  plan  Would be reasonable for  APP Follow up        Medication Adjustments/Labs and Tests Ordered: Current medicines are reviewed at length with the patient today.  Concerns regarding medicines are outlined above.  Orders Placed This Encounter  Procedures  . Pro b natriuretic peptide (BNP)  . EKG 12-Lead  . ECHOCARDIOGRAM COMPLETE   No orders of the defined types were placed in this encounter.   Patient Instructions  Medication Instructions:  Your physician recommends that you continue on your current medications as directed. Please refer to the Current Medication list given to you today.  *If you need a refill on your cardiac medications before your next appointment, please call your pharmacy*   Lab Work: TODAY: BNP If you have labs (blood work) drawn today and your tests are completely normal, you will receive your results only by: Marland Kitchen MyChart Message (if you have MyChart) OR . A paper copy in the mail If you have any lab test that is abnormal or we need to change your treatment, we will call you to review the results.   Testing/Procedures: Your physician has requested that you have an echocardiogram. Echocardiography is a painless test that uses sound waves to create images of your heart. It provides your doctor with information about the size and shape of your heart and how well your heart's chambers and valves are working. This procedure takes approximately one hour. There are no restrictions for this procedure.     Follow-Up: At [facility], you and your health needs are our priority.  As part of our continuing mission to provide you with exceptional heart care, we have created designated Provider Care Teams.  These Care Teams include your primary Cardiologist (physician) and Advanced Practice  Providers (APPs -  Physician Assistants and Nurse Practitioners) who all work together to provide you with the care you need, when you need it.  We recommend signing up for the patient portal called "MyChart".  Sign up information is provided on this After Visit Summary.  MyChart is used to connect with patients for Virtual Visits (Telemedicine).  Patients are able to view lab/test results, encounter notes, upcoming appointments, etc.  Non-urgent messages can be sent to your provider as well.   To learn more about what you can do with MyChart, go to [email or web address].    Your next appointment:   5 month(s)  The format for your next appointment:   In Person  Provider:   You may see Gasper Sells, MD or one of the following Advanced Practice Providers on your designated Care Team:    Melina Copa, PA-C  Ermalinda Barrios, PA-C    Other Instructions   Please check your BP 3 times per week.     Signed, Werner Lean, MD  01/21/2021 12:37 PM    [place]

## 2021-01-22 ENCOUNTER — Telehealth: Payer: Self-pay | Admitting: Internal Medicine

## 2021-01-22 LAB — PRO B NATRIURETIC PEPTIDE: NT-Pro BNP: 551 pg/mL (ref 0–738)

## 2021-01-22 NOTE — Telephone Encounter (Signed)
° ° °  ° °  [facility] Pre-operative Risk Assessment    HEARTCARE STAFF: - Please ensure there is not already an duplicate clearance open for this procedure. - Under Visit Info/Reason for Call, type in Other and utilize the format Clearance MM/DD/YY or Clearance TBD. Do not use dashes or single digits. - If request is for dental extraction, please clarify the # of teeth to be extracted.  Request for surgical clearance:  1. What type of surgery is being performed? Right hip replacement   2. When is this surgery scheduled? 02/13/21  3. What type of clearance is required (medical clearance vs. Pharmacy clearance to hold med vs. Both)? Both  4. Are there any medications that need to be held prior to surgery and how long? Any necessary medications pt's need to be held prior surgery  5. Practice name and name of physician performing surgery? Dr. Frederik Pear   6. What is the office phone number? [phone]   7.   What is the office fax number? [phone]  8.   Anesthesia type (None, local, MAC, general) ? Spinal   Christina Sellers 01/22/2021, 2:50 PM  _________________________________________________________________   (provider comments below)

## 2021-01-27 ENCOUNTER — Emergency Department (HOSPITAL_COMMUNITY): Payer: Medicare Other

## 2021-01-27 ENCOUNTER — Encounter (HOSPITAL_COMMUNITY): Payer: Self-pay | Admitting: Emergency Medicine

## 2021-01-27 ENCOUNTER — Emergency Department (HOSPITAL_COMMUNITY)
Admission: EM | Admit: 2021-01-27 | Discharge: 2021-01-27 | Disposition: A | Discharge status: Home / Self Care | Payer: Medicare Other | Attending: Emergency Medicine | Admitting: Emergency Medicine

## 2021-01-27 ENCOUNTER — Other Ambulatory Visit: Payer: Self-pay

## 2021-01-27 DIAGNOSIS — Z23 Encounter for immunization: Secondary | ICD-10-CM | POA: Diagnosis not present

## 2021-01-27 DIAGNOSIS — E039 Hypothyroidism, unspecified: Secondary | ICD-10-CM | POA: Diagnosis not present

## 2021-01-27 DIAGNOSIS — J45909 Unspecified asthma, uncomplicated: Secondary | ICD-10-CM | POA: Diagnosis not present

## 2021-01-27 DIAGNOSIS — Z7984 Long term (current) use of oral hypoglycemic drugs: Secondary | ICD-10-CM | POA: Diagnosis not present

## 2021-01-27 DIAGNOSIS — R55 Syncope and collapse: Secondary | ICD-10-CM | POA: Diagnosis not present

## 2021-01-27 DIAGNOSIS — W1839XA Other fall on same level, initial encounter: Secondary | ICD-10-CM | POA: Insufficient documentation

## 2021-01-27 DIAGNOSIS — Z87891 Personal history of nicotine dependence: Secondary | ICD-10-CM | POA: Diagnosis not present

## 2021-01-27 DIAGNOSIS — Y92002 Bathroom of unspecified non-institutional (private) residence single-family (private) house as the place of occurrence of the external cause: Secondary | ICD-10-CM | POA: Diagnosis not present

## 2021-01-27 DIAGNOSIS — Z7952 Long term (current) use of systemic steroids: Secondary | ICD-10-CM | POA: Diagnosis not present

## 2021-01-27 DIAGNOSIS — R404 Transient alteration of awareness: Secondary | ICD-10-CM | POA: Diagnosis not present

## 2021-01-27 DIAGNOSIS — H05232 Hemorrhage of left orbit: Secondary | ICD-10-CM

## 2021-01-27 DIAGNOSIS — H748X3 Other specified disorders of middle ear and mastoid, bilateral: Secondary | ICD-10-CM | POA: Diagnosis not present

## 2021-01-27 DIAGNOSIS — E119 Type 2 diabetes mellitus without complications: Secondary | ICD-10-CM | POA: Diagnosis not present

## 2021-01-27 DIAGNOSIS — I10 Essential (primary) hypertension: Secondary | ICD-10-CM | POA: Insufficient documentation

## 2021-01-27 DIAGNOSIS — W19XXXA Unspecified fall, initial encounter: Secondary | ICD-10-CM | POA: Diagnosis not present

## 2021-01-27 DIAGNOSIS — S51812A Laceration without foreign body of left forearm, initial encounter: Secondary | ICD-10-CM | POA: Diagnosis not present

## 2021-01-27 DIAGNOSIS — S0012XA Contusion of left eyelid and periocular area, initial encounter: Secondary | ICD-10-CM | POA: Diagnosis not present

## 2021-01-27 DIAGNOSIS — K529 Noninfective gastroenteritis and colitis, unspecified: Secondary | ICD-10-CM | POA: Diagnosis not present

## 2021-01-27 DIAGNOSIS — R6889 Other general symptoms and signs: Secondary | ICD-10-CM | POA: Diagnosis not present

## 2021-01-27 DIAGNOSIS — R220 Localized swelling, mass and lump, head: Secondary | ICD-10-CM | POA: Diagnosis not present

## 2021-01-27 DIAGNOSIS — Z79899 Other long term (current) drug therapy: Secondary | ICD-10-CM | POA: Diagnosis not present

## 2021-01-27 DIAGNOSIS — S59912A Unspecified injury of left forearm, initial encounter: Secondary | ICD-10-CM | POA: Diagnosis present

## 2021-01-27 DIAGNOSIS — R112 Nausea with vomiting, unspecified: Secondary | ICD-10-CM | POA: Diagnosis not present

## 2021-01-27 DIAGNOSIS — J449 Chronic obstructive pulmonary disease, unspecified: Secondary | ICD-10-CM | POA: Insufficient documentation

## 2021-01-27 DIAGNOSIS — Z743 Need for continuous supervision: Secondary | ICD-10-CM | POA: Diagnosis not present

## 2021-01-27 LAB — CBC
HCT: 35.7 % — ABNORMAL LOW (ref 36.0–46.0)
Hemoglobin: 11.6 g/dL — ABNORMAL LOW (ref 12.0–15.0)
MCH: 33.4 pg (ref 26.0–34.0)
MCHC: 32.5 g/dL (ref 30.0–36.0)
MCV: 102.9 fL — ABNORMAL HIGH (ref 80.0–100.0)
Platelets: 169 10*3/uL (ref 150–400)
RBC: 3.47 MIL/uL — ABNORMAL LOW (ref 3.87–5.11)
RDW: 12.7 % (ref 11.5–15.5)
WBC: 8.5 10*3/uL (ref 4.0–10.5)
nRBC: 0 % (ref 0.0–0.2)

## 2021-01-27 LAB — COMPREHENSIVE METABOLIC PANEL
ALT: 15 U/L (ref 0–44)
AST: 24 U/L (ref 15–41)
Albumin: 4.1 g/dL (ref 3.5–5.0)
Alkaline Phosphatase: 46 U/L (ref 38–126)
Anion gap: 12 (ref 5–15)
BUN: 28 mg/dL — ABNORMAL HIGH (ref 8–23)
CO2: 18 mmol/L — ABNORMAL LOW (ref 22–32)
Calcium: 8.9 mg/dL (ref 8.9–10.3)
Chloride: 101 mmol/L (ref 98–111)
Creatinine, Ser: 1.02 mg/dL — ABNORMAL HIGH (ref 0.44–1.00)
GFR, Estimated: 55 mL/min — ABNORMAL LOW (ref >60)
Glucose, Bld: 156 mg/dL — ABNORMAL HIGH (ref 70–99)
Potassium: 3.9 mmol/L (ref 3.5–5.1)
Sodium: 131 mmol/L — ABNORMAL LOW (ref 135–145)
Total Bilirubin: 1 mg/dL (ref 0.3–1.2)
Total Protein: 6.5 g/dL (ref 6.5–8.1)

## 2021-01-27 LAB — URINALYSIS, ROUTINE W REFLEX MICROSCOPIC
Bilirubin Urine: NEGATIVE
Glucose, UA: NEGATIVE mg/dL
Hgb urine dipstick: NEGATIVE
Ketones, ur: 5 mg/dL — AB
Nitrite: POSITIVE — AB
Protein, ur: NEGATIVE mg/dL
Specific Gravity, Urine: 1.014 (ref 1.005–1.030)
pH: 5 (ref 5.0–8.0)

## 2021-01-27 LAB — LIPASE, BLOOD: Lipase: 32 U/L (ref 11–51)

## 2021-01-27 MED ORDER — SODIUM CHLORIDE 0.9% FLUSH
3.0000 mL | Freq: Once | INTRAVENOUS | Status: AC
  Administered 2021-01-27: 3 mL via INTRAVENOUS

## 2021-01-27 MED ORDER — KETOROLAC TROMETHAMINE 30 MG/ML IJ SOLN
15.0000 mg | Freq: Once | INTRAMUSCULAR | Status: AC
  Administered 2021-01-27: 15 mg via INTRAVENOUS
  Filled 2021-01-27: qty 1

## 2021-01-27 MED ORDER — ACETAMINOPHEN 325 MG PO TABS
650.0000 mg | ORAL_TABLET | Freq: Once | ORAL | Status: AC
  Administered 2021-01-27: 650 mg via ORAL
  Filled 2021-01-27: qty 2

## 2021-01-27 MED ORDER — CEPHALEXIN 500 MG PO CAPS: 500.0000 mg | ORAL_CAPSULE | Freq: Three times a day (TID) | ORAL | 0 refills | Status: DC

## 2021-01-27 MED ORDER — ONDANSETRON 4 MG PO TBDP: 4.0000 mg | ORAL_TABLET | Freq: Three times a day (TID) | ORAL | 0 refills | Status: DC | PRN

## 2021-01-27 MED ORDER — TETANUS-DIPHTH-ACELL PERTUSSIS 5-2.5-18.5 LF-MCG/0.5 IM SUSY
0.5000 mL | PREFILLED_SYRINGE | Freq: Once | INTRAMUSCULAR | Status: AC
  Administered 2021-01-27: 0.5 mL via INTRAMUSCULAR
  Filled 2021-01-27: qty 0.5

## 2021-01-27 NOTE — ED Notes (Signed)
Pt unable to use Purewick. This RN assisted pt to bedside commode. Pt said that sitting up eased her pain some.

## 2021-01-27 NOTE — ED Provider Notes (Signed)
[facility] EMERGENCY DEPARTMENT Provider Note  CSN: [id number] Arrival date & time: 01/27/21 1337    History Chief Complaint  Patient presents with  . Vomiting  . Diarrhea  . Loss of Consciousness    HPI  Christina Sellers is a 80 y.o. female brought to the ED via EMS from home. She reports last night after she had gone to bed, she had sudden onset of multiple episodes of non-bloody watery diarrhea. This persisted through the night and morning today. She reports her son came to check on her this morning and she was able to eat some toast and egg salad for breakfast. She was in the rest room in the middle of the day when she began to feel lightheaded/dizzy. The next thing she recalls she was on the floor, wedged between her toilet and the baseboard heater. She called her daughter who called EMS. She then began to have episodes of vomiting. Given Zofran and IVF en route. She denies any abdominal pain. Last diarrhea and vomiting were PTA. She is complaining of pain in her left hip from known avascular necrosis and having to sit in the waiting room for a prolonged time. She also was found to have hematoma to her L face and skin tear on her L arm which was dressed PTA.    Past Medical History:  Diagnosis Date  . Abnormal Pap smear of vagina   . Allergic sinusitis   . Anxiety   . Atherosclerotic peripheral vascular disease ([place])   . Carotid artery stenosis   . Cellulitis   . COPD with asthma ([place])    not an issue now  . Depression   . Diabetes mellitus type 2, uncomplicated ([place])   . DJD (degenerative joint disease)   . Elevated cholesterol   . Elevated MCV    secondary to alchol  . GERD (gastroesophageal reflux disease)    not an issue now.  . Gout   . Hearing difficulty    bilateral hearing aids  . History of recurrent UTIs   . Hypertension    loss weight'off meds now"  . Hypothyroidism   . IBS (irritable bowel syndrome)   . Insomnia   . Labial fusion   . Osteoarthritis  of left knee   . Osteopenia   . Recurrent vaginitis   . Sjogren's syndrome ([place])   . Syncope 11.7.14   secondary to dehydration and possible hypoglycemia  . Tendonitis   . Thyroid disease   . Transfusion history    age 80 "anemia"    Past Surgical History:  Procedure Laterality Date  . CATARACT EXTRACTION, BILATERAL Bilateral   . CHOLECYSTECTOMY     open  . COLONOSCOPY WITH PROPOFOL N/A 03/23/2016   Procedure: COLONOSCOPY WITH PROPOFOL;  Surgeon: Garlan Fair, MD;  Location: [facility];  Service: Endoscopy;  Laterality: N/A;  . EYE SURGERY    . FOOT SURGERY Left    retained hardware  . KNEE SURGERY Left    scope   . TONSILLECTOMY      Family History  Problem Relation Age of Onset  . Diabetes Maternal Aunt   . Cancer Father        Oral cancer  . Breast cancer Sister        Age 53's  . Cancer Brother        Lung cancer    Social History   Tobacco Use  . Smoking status: Former Smoker    Quit date: 02/21/1982  Years since quitting: 38.9  . Smokeless tobacco: Never Used  Vaping Use  . Vaping Use: Never used  Substance Use Topics  . Alcohol use: No  . Drug use: No     Home Medications Prior to Admission medications   Medication Sig Start Date End Date Taking? Authorizing Provider  ondansetron (ZOFRAN ODT) 4 MG disintegrating tablet Take 1 tablet (4 mg total) by mouth every 8 (eight) hours as needed for nausea or vomiting. 01/27/21  Yes Truddie Hidden, MD  acetaminophen (TYLENOL) 500 MG tablet Take 1,000 mg by mouth every 6 (six) hours as needed for mild pain.    [provider]  Ascorbic Acid (VITAMIN C) 500 MG CAPS Take 500 mg by mouth daily.    [provider]  betamethasone valerate ointment (VALISONE) 0.1 % Apply 1 application topically 2 (two) times daily. 02/14/18   Huel Cote, NP  Calcium Citrate 250 MG TABS Take 250 mg by mouth 2 (two) times daily as needed.    [provider]  Cholecalciferol (VITAMIN D3) 1000 UNITS  CAPS Take 1,000 mg by mouth daily.      [provider]  CRESTOR 10 MG tablet Take 10 mg by mouth as directed. Takes 10 mg Monday Wednesday and Friday,and 5 mg the rest of the week 11/08/15   [provider]  diclofenac sodium (VOLTAREN) 1 % GEL  03/29/19   [provider]  fluorometholone (FML) 0.1 % ophthalmic suspension  12/27/18   [provider]  fluticasone (FLONASE) 50 MCG/ACT nasal spray Place 1 spray into both nostrils daily.    [provider]  levothyroxine (SYNTHROID, LEVOTHROID) 100 MCG tablet Take 1 tablet by mouth daily before breakfast.  10/23/14   [provider]  loratadine-pseudoephedrine (CLARITIN-D 12-HOUR) 5-120 MG tablet Take by mouth.    [provider]  meloxicam (MOBIC) 15 MG tablet meloxicam 15 mg tablet    [provider]  metFORMIN (GLUCOPHAGE) 500 MG tablet Take 1 tablet by mouth daily. 02/20/19   [provider]  metoprolol succinate (TOPROL-XL) 25 MG 24 hr tablet Take 25 mg by mouth daily.    [provider]  nystatin cream (MYCOSTATIN) Apply 1 application topically 2 (two) times daily. Insert one applicatoful intravaginally hs x 3 nights. 10/30/19   Huel Cote, NP  nystatin-triamcinolone ointment [facility]) Apply 1 application topically 2 (two) times daily. 02/21/19   Huel Cote, NP  psyllium (HYDROCIL/METAMUCIL) 95 % PACK Take 1 packet by mouth daily.    [provider]  RESTASIS 0.05 % ophthalmic emulsion Place 1 drop into both eyes 2 (two) times daily.  11/13/15   [provider]  terconazole (TERAZOL 7) 0.4 % vaginal cream Place 1 applicator vaginally at bedtime. 10/29/19   Huel Cote, NP  triamterene-hydrochlorothiazide (MAXZIDE-25) 37.5-25 MG tablet Take 1 tablet by mouth daily.    [provider]  vitamin E 400 UNIT capsule Take 400 Units by mouth daily.      [provider]     Allergies    Celexa [citalopram hydrobromide],  Fluconazole, Green tea leaf ext, Levaquin [levofloxacin in d5w], Losartan, Nitrofurantoin macrocrystal, Scopolamine, Sulfa antibiotics, Trimethoprim, and Triamcinolone acetonide   Review of Systems   Review of Systems A comprehensive review of systems was completed and negative except as noted in HPI.    Physical Exam BP (!) 142/53   Pulse 84   Temp 97.6 F (36.4 C) (Oral)   Resp 16  SpO2 96%   Physical Exam Vitals and nursing note reviewed.  Constitutional:      Appearance: Normal appearance.  HENT:     Head: Normocephalic.     Comments: L periorbital hematoma with abrasion to L eyebrow    Nose: Nose normal.     Mouth/Throat:     Mouth: Mucous membranes are moist.  Eyes:     Extraocular Movements: Extraocular movements intact.     Conjunctiva/sclera: Conjunctivae normal.     Pupils: Pupils are equal, round, and reactive to light.  Cardiovascular:     Rate and Rhythm: Normal rate.  Pulmonary:     Effort: Pulmonary effort is normal.     Breath sounds: Normal breath sounds.  Abdominal:     General: Abdomen is flat.     Palpations: Abdomen is soft.     Tenderness: There is no abdominal tenderness.  Musculoskeletal:        General: No swelling. Normal range of motion.     Cervical back: Neck supple. No tenderness.  Skin:    General: Skin is warm and dry.     Comments: Skin tear on L arm in dressing  Neurological:     General: No focal deficit present.     Mental Status: She is alert.  Psychiatric:        Mood and Affect: Mood normal.      ED Results / Procedures / Treatments   Labs (all labs ordered are listed, but only abnormal results are displayed) Labs Reviewed  COMPREHENSIVE METABOLIC PANEL - Abnormal; Notable for the following components:      Result Value   Sodium 131 (*)    CO2 18 (*)    Glucose, Bld 156 (*)    BUN 28 (*)    Creatinine, Ser 1.02 (*)    GFR, Estimated 55 (*)    All other components within normal limits  CBC - Abnormal; Notable  for the following components:   RBC 3.47 (*)    Hemoglobin 11.6 (*)    HCT 35.7 (*)    MCV 102.9 (*)    All other components within normal limits  URINALYSIS, ROUTINE W REFLEX MICROSCOPIC - Abnormal; Notable for the following components:   APPearance HAZY (*)    Ketones, ur 5 (*)    Nitrite POSITIVE (*)    Leukocytes,Ua SMALL (*)    Bacteria, UA MANY (*)    All other components within normal limits  URINE CULTURE  LIPASE, BLOOD  CBG MONITORING, ED    EKG EKG Interpretation  Date/Time:  Tuesday January 27 2021 13:54:43 EST Ventricular Rate:  79 PR Interval:  214 QRS Duration: 82 QT Interval:  396 QTC Calculation: 454 R Axis:   70 Text Interpretation: Sinus rhythm with 1st degree A-V block Cannot rule out Anterior infarct , age undetermined Abnormal ECG No significant change since last tracing Confirmed by Calvert Cantor [phone]) on 01/27/2021 7:16:18 PM   Radiology CT Head Wo Contrast  Result Date: 01/27/2021 CLINICAL DATA:  Syncopal episode with a fall from standing. EXAM: CT HEAD WITHOUT CONTRAST CT MAXILLOFACIAL WITHOUT CONTRAST TECHNIQUE: Multidetector CT imaging of the head and maxillofacial structures were performed using the standard protocol without intravenous contrast. Multiplanar CT image reconstructions of the maxillofacial structures were also generated. COMPARISON:  None. FINDINGS: CT HEAD FINDINGS Brain: No evidence of acute infarction, hemorrhage, hydrocephalus, extra-axial collection or mass lesion/mass effect. There is mild cerebral volume loss with associated ex vacuo dilatation. Periventricular white matter hypoattenuation  likely represents chronic small vessel ischemic disease. Vascular: There are vascular calcifications in the carotid siphons. Skull: Normal. Negative for fracture or focal lesion. Other: There is a small right mastoid effusion. CT MAXILLOFACIAL FINDINGS Osseous: No fracture or mandibular dislocation. No destructive process. Orbits: Negative. No  traumatic or inflammatory finding. Sinuses: There is left sphenoid sinus disease. Soft tissues: There is left face and periorbital soft tissue swelling. IMPRESSION: 1. No acute intracranial process. 2. No evidence of acute facial bone fracture. Electronically Signed   By: Zerita Boers M.D.   On: 01/27/2021 20:29   CT Maxillofacial WO CM  Result Date: 01/27/2021 CLINICAL DATA:  Syncopal episode with a fall from standing. EXAM: CT HEAD WITHOUT CONTRAST CT MAXILLOFACIAL WITHOUT CONTRAST TECHNIQUE: Multidetector CT imaging of the head and maxillofacial structures were performed using the standard protocol without intravenous contrast. Multiplanar CT image reconstructions of the maxillofacial structures were also generated. COMPARISON:  None. FINDINGS: CT HEAD FINDINGS Brain: No evidence of acute infarction, hemorrhage, hydrocephalus, extra-axial collection or mass lesion/mass effect. There is mild cerebral volume loss with associated ex vacuo dilatation. Periventricular white matter hypoattenuation likely represents chronic small vessel ischemic disease. Vascular: There are vascular calcifications in the carotid siphons. Skull: Normal. Negative for fracture or focal lesion. Other: There is a small right mastoid effusion. CT MAXILLOFACIAL FINDINGS Osseous: No fracture or mandibular dislocation. No destructive process. Orbits: Negative. No traumatic or inflammatory finding. Sinuses: There is left sphenoid sinus disease. Soft tissues: There is left face and periorbital soft tissue swelling. IMPRESSION: 1. No acute intracranial process. 2. No evidence of acute facial bone fracture. Electronically Signed   By: Zerita Boers M.D.   On: 01/27/2021 20:29    Procedures Procedures  Medications Ordered in the ED Medications  Tdap (BOOSTRIX) injection 0.5 mL (has no administration in time range)  sodium chloride flush (NS) 0.9 % injection 3 mL (3 mLs Intravenous Given 01/27/21 2045)  acetaminophen (TYLENOL) tablet 650  mg (650 mg Oral Given 01/27/21 1921)  ketorolac (TORADOL) 30 MG/ML injection 15 mg (15 mg Intravenous Given 01/27/21 2042)     MDM Rules/Calculators/A&P MDM Labs done in triage reviewed, possible UTI, although patient denies any current symptoms. Will send for culture. Otherwise no significant elyte or blood count abnormalities. Will send for CT of head/face due to injury. She would like some APAP for her hip pain and a PO trial of water. Spoke with son and given an update.  ED Course  I have reviewed the triage vital signs and the nursing notes.  Pertinent labs & imaging results that were available during my care of the patient were reviewed by me and considered in my medical decision making (see chart for details).  Clinical Course as of 01/27/21 2213  Tue Jan 27, 2021  2035 CT negative for intracranial injury or facial fracture. Patient requesting additional pain medications but would like to avoid opiates. She takes Meloxicam at home but thinks she vomited it this morning. Will give a dose of Toradol and reassess.  [CS]  2128 Dressing removed from L forearm, two superficial skin tears.  [CS]  2212 Patient would like Rx for possible UTI pending culture. Keflex Rx sent to pharmacy.  [CS]    Clinical Course User Index [CS] Truddie Hidden, MD    Final Clinical Impression(s) / ED Diagnoses Final diagnoses:  Gastroenteritis  Syncope and collapse  Periorbital hematoma of left eye  Skin tear of left forearm without complication, initial encounter    Rx /  DC Orders ED Discharge Orders         Ordered    AMB referral to wound care center        01/27/21 2143    ondansetron (ZOFRAN ODT) 4 MG disintegrating tablet  Every 8 hours PRN        01/27/21 2143           Truddie Hidden, MD 01/27/21 2145

## 2021-01-27 NOTE — ED Triage Notes (Signed)
Pt to triage via [facility] from home.  Reports nausea, vomiting, and diarrhea since last night after eating fish.  Had a syncopal episode while using bathroom.  Small laceration to L elbow with hematoma and approx 2 inch skin tear to L forearm.  Bandage in place.  20g RAC.  NS 500cc and Zofran 4mg  given PTA.

## 2021-01-27 NOTE — ED Notes (Signed)
Pt to CT

## 2021-01-27 NOTE — ED Notes (Signed)
Patient verbalizes understanding of discharge instructions. Opportunity for questioning and answers were provided. Armband removed by staff, pt discharged from ED via wheelchair.  

## 2021-01-28 DIAGNOSIS — M545 Low back pain, unspecified: Secondary | ICD-10-CM | POA: Diagnosis not present

## 2021-01-28 DIAGNOSIS — R55 Syncope and collapse: Secondary | ICD-10-CM | POA: Diagnosis not present

## 2021-01-28 DIAGNOSIS — S51819A Laceration without foreign body of unspecified forearm, initial encounter: Secondary | ICD-10-CM | POA: Diagnosis not present

## 2021-01-28 DIAGNOSIS — N390 Urinary tract infection, site not specified: Secondary | ICD-10-CM | POA: Diagnosis not present

## 2021-01-28 DIAGNOSIS — S0990XA Unspecified injury of head, initial encounter: Secondary | ICD-10-CM | POA: Diagnosis not present

## 2021-01-28 NOTE — Telephone Encounter (Signed)
    Christina Sellers with [facility] calling back to follow up clearance

## 2021-01-28 NOTE — Telephone Encounter (Signed)
Left message for the pt to call the office to schedule a pre op appt to be done a couple of days after her echo (02/09/21). Pt can be seen at the [facility] office if nothing available at [facility] location.

## 2021-01-28 NOTE — Telephone Encounter (Signed)
I s/w pt to schedule appt after echo, though pt asked for me to call her back in about 30 minutes or so, that she just left her PCP office. Pt states she fell today and saw PCP. I stated that will be fine that I will call her back later.

## 2021-01-28 NOTE — Telephone Encounter (Deleted)
   Primary Cardiologist: Dr Gasper Sells  Chart reviewed as part of pre-operative protocol coverage. Given past medical history and time since last visit, based on ACC/AHA guidelines, Christina Sellers would be at acceptable risk for the planned procedure without further cardiovascular testing.   The patient was advised that if she develops new symptoms prior to surgery to contact our office to arrange for a follow-up visit, and she verbalized understanding.  I will route this recommendation to the requesting party via Epic fax function and remove from pre-op pool.  Please call with questions.  Kerin Ransom, PA-C 01/28/2021, 11:44 AM

## 2021-01-28 NOTE — Telephone Encounter (Signed)
This patient was seen in the office 01/21/2021 and cleared for hip surgery. Yesterday she was seen in the emergency room after a syncopal spell. It sounds like it was most likley secondary to GI issues and not cardiac. She is scheduled to have an echo 02/09/2021.  I think it would be prudent to see her in the office after her echo before we grant clearance.  I would not change her surgery date. Our office will contact the patient for an appointment after her echo.  Kerin Ransom PA-C 01/28/2021 11:53 AM

## 2021-01-29 NOTE — Telephone Encounter (Signed)
I s/w the pt today and she has been scheduled to see Kerin Ransom, [facility] 02/11/21 @ 10:45 for pre op clearance. Nothing available at the [facility] office. Pt is aware she will be seen at the [facility] office and is aware of the location for the [facility] office. Pt thanked me for the call and the help. I will send notes to [facility] for upcoming appt. I will send FYI to requesting office pt has appts.

## 2021-01-30 LAB — URINE CULTURE: Culture: >=100000 — AB

## 2021-02-01 ENCOUNTER — Emergency Department (HOSPITAL_COMMUNITY): Payer: Medicare Other

## 2021-02-01 ENCOUNTER — Encounter (HOSPITAL_COMMUNITY): Payer: Self-pay | Admitting: Internal Medicine

## 2021-02-01 ENCOUNTER — Observation Stay (HOSPITAL_COMMUNITY)
Admission: EM | Admit: 2021-02-01 | Discharge: 2021-02-05 | Disposition: A | Discharge status: Home / Self Care | Payer: Medicare Other | Attending: Internal Medicine | Admitting: Internal Medicine

## 2021-02-01 ENCOUNTER — Other Ambulatory Visit: Payer: Self-pay

## 2021-02-01 DIAGNOSIS — M8588 Other specified disorders of bone density and structure, other site: Secondary | ICD-10-CM | POA: Diagnosis not present

## 2021-02-01 DIAGNOSIS — H748X1 Other specified disorders of right middle ear and mastoid: Secondary | ICD-10-CM | POA: Diagnosis not present

## 2021-02-01 DIAGNOSIS — Z79899 Other long term (current) drug therapy: Secondary | ICD-10-CM | POA: Insufficient documentation

## 2021-02-01 DIAGNOSIS — E1165 Type 2 diabetes mellitus with hyperglycemia: Secondary | ICD-10-CM | POA: Diagnosis not present

## 2021-02-01 DIAGNOSIS — J449 Chronic obstructive pulmonary disease, unspecified: Secondary | ICD-10-CM | POA: Insufficient documentation

## 2021-02-01 DIAGNOSIS — I10 Essential (primary) hypertension: Secondary | ICD-10-CM | POA: Diagnosis present

## 2021-02-01 DIAGNOSIS — G319 Degenerative disease of nervous system, unspecified: Secondary | ICD-10-CM | POA: Diagnosis not present

## 2021-02-01 DIAGNOSIS — R112 Nausea with vomiting, unspecified: Secondary | ICD-10-CM | POA: Diagnosis not present

## 2021-02-01 DIAGNOSIS — R197 Diarrhea, unspecified: Secondary | ICD-10-CM | POA: Diagnosis present

## 2021-02-01 DIAGNOSIS — B9620 Unspecified Escherichia coli [E. coli] as the cause of diseases classified elsewhere: Secondary | ICD-10-CM | POA: Diagnosis present

## 2021-02-01 DIAGNOSIS — N390 Urinary tract infection, site not specified: Secondary | ICD-10-CM | POA: Diagnosis not present

## 2021-02-01 DIAGNOSIS — N3000 Acute cystitis without hematuria: Secondary | ICD-10-CM | POA: Insufficient documentation

## 2021-02-01 DIAGNOSIS — Z20822 Contact with and (suspected) exposure to covid-19: Secondary | ICD-10-CM | POA: Diagnosis not present

## 2021-02-01 DIAGNOSIS — M545 Low back pain, unspecified: Secondary | ICD-10-CM | POA: Diagnosis present

## 2021-02-01 DIAGNOSIS — E039 Hypothyroidism, unspecified: Secondary | ICD-10-CM | POA: Diagnosis not present

## 2021-02-01 DIAGNOSIS — M4316 Spondylolisthesis, lumbar region: Secondary | ICD-10-CM | POA: Diagnosis not present

## 2021-02-01 DIAGNOSIS — Z7984 Long term (current) use of oral hypoglycemic drugs: Secondary | ICD-10-CM | POA: Insufficient documentation

## 2021-02-01 DIAGNOSIS — E1169 Type 2 diabetes mellitus with other specified complication: Secondary | ICD-10-CM | POA: Diagnosis not present

## 2021-02-01 DIAGNOSIS — R1110 Vomiting, unspecified: Secondary | ICD-10-CM | POA: Diagnosis not present

## 2021-02-01 DIAGNOSIS — I5032 Chronic diastolic (congestive) heart failure: Secondary | ICD-10-CM | POA: Insufficient documentation

## 2021-02-01 DIAGNOSIS — Z87891 Personal history of nicotine dependence: Secondary | ICD-10-CM | POA: Diagnosis not present

## 2021-02-01 DIAGNOSIS — J45909 Unspecified asthma, uncomplicated: Secondary | ICD-10-CM | POA: Insufficient documentation

## 2021-02-01 DIAGNOSIS — R29818 Other symptoms and signs involving the nervous system: Secondary | ICD-10-CM | POA: Diagnosis not present

## 2021-02-01 DIAGNOSIS — E872 Acidosis, unspecified: Secondary | ICD-10-CM | POA: Diagnosis present

## 2021-02-01 DIAGNOSIS — R55 Syncope and collapse: Principal | ICD-10-CM | POA: Insufficient documentation

## 2021-02-01 DIAGNOSIS — E119 Type 2 diabetes mellitus without complications: Secondary | ICD-10-CM | POA: Insufficient documentation

## 2021-02-01 DIAGNOSIS — E871 Hypo-osmolality and hyponatremia: Secondary | ICD-10-CM | POA: Diagnosis present

## 2021-02-01 DIAGNOSIS — I110 Hypertensive heart disease with heart failure: Secondary | ICD-10-CM | POA: Insufficient documentation

## 2021-02-01 DIAGNOSIS — E876 Hypokalemia: Secondary | ICD-10-CM | POA: Insufficient documentation

## 2021-02-01 DIAGNOSIS — E860 Dehydration: Secondary | ICD-10-CM | POA: Diagnosis not present

## 2021-02-01 DIAGNOSIS — M5136 Other intervertebral disc degeneration, lumbar region: Secondary | ICD-10-CM | POA: Diagnosis not present

## 2021-02-01 DIAGNOSIS — Z87828 Personal history of other (healed) physical injury and trauma: Secondary | ICD-10-CM | POA: Diagnosis not present

## 2021-02-01 DIAGNOSIS — G8929 Other chronic pain: Secondary | ICD-10-CM

## 2021-02-01 DIAGNOSIS — E782 Mixed hyperlipidemia: Secondary | ICD-10-CM

## 2021-02-01 DIAGNOSIS — M5134 Other intervertebral disc degeneration, thoracic region: Secondary | ICD-10-CM | POA: Diagnosis not present

## 2021-02-01 DIAGNOSIS — I6782 Cerebral ischemia: Secondary | ICD-10-CM | POA: Diagnosis not present

## 2021-02-01 DIAGNOSIS — M47816 Spondylosis without myelopathy or radiculopathy, lumbar region: Secondary | ICD-10-CM | POA: Diagnosis not present

## 2021-02-01 LAB — APTT: aPTT: 24 seconds (ref 24–36)

## 2021-02-01 LAB — COMPREHENSIVE METABOLIC PANEL
ALT: 14 U/L (ref 0–44)
AST: 27 U/L (ref 15–41)
Albumin: 4.2 g/dL (ref 3.5–5.0)
Alkaline Phosphatase: 46 U/L (ref 38–126)
Anion gap: 14 (ref 5–15)
BUN: 34 mg/dL — ABNORMAL HIGH (ref 8–23)
CO2: 16 mmol/L — ABNORMAL LOW (ref 22–32)
Calcium: 9.6 mg/dL (ref 8.9–10.3)
Chloride: 98 mmol/L (ref 98–111)
Creatinine, Ser: 1.08 mg/dL — ABNORMAL HIGH (ref 0.44–1.00)
GFR, Estimated: 52 mL/min — ABNORMAL LOW (ref >60)
Glucose, Bld: 168 mg/dL — ABNORMAL HIGH (ref 70–99)
Potassium: 3.9 mmol/L (ref 3.5–5.1)
Sodium: 128 mmol/L — ABNORMAL LOW (ref 135–145)
Total Bilirubin: 1.1 mg/dL (ref 0.3–1.2)
Total Protein: 6.9 g/dL (ref 6.5–8.1)

## 2021-02-01 LAB — URINALYSIS, ROUTINE W REFLEX MICROSCOPIC
Bilirubin Urine: NEGATIVE
Glucose, UA: NEGATIVE mg/dL
Hgb urine dipstick: NEGATIVE
Ketones, ur: NEGATIVE mg/dL
Leukocytes,Ua: NEGATIVE
Nitrite: NEGATIVE
Protein, ur: NEGATIVE mg/dL
Specific Gravity, Urine: 1.013 (ref 1.005–1.030)
pH: 5 (ref 5.0–8.0)

## 2021-02-01 LAB — RESP PANEL BY RT-PCR (FLU A&B, COVID) ARPGX2
Influenza A by PCR: NEGATIVE
Influenza B by PCR: NEGATIVE
SARS Coronavirus 2 by RT PCR: NEGATIVE

## 2021-02-01 LAB — LIPASE, BLOOD: Lipase: 30 U/L (ref 11–51)

## 2021-02-01 LAB — OSMOLALITY: Osmolality: 278 mOsm/kg (ref 275–295)

## 2021-02-01 LAB — BLOOD GAS, VENOUS
Acid-base deficit: 5.7 mmol/L — ABNORMAL HIGH (ref 0.0–2.0)
Bicarbonate: 18.9 mmol/L — ABNORMAL LOW (ref 20.0–28.0)
O2 Saturation: 83.4 %
Patient temperature: 37
pCO2, Ven: 35.5 mmHg — ABNORMAL LOW (ref 44.0–60.0)
pH, Ven: 7.347 (ref 7.250–7.430)
pO2, Ven: 53 mmHg — ABNORMAL HIGH (ref 32.0–45.0)

## 2021-02-01 LAB — CBC
HCT: 37.3 % (ref 36.0–46.0)
Hemoglobin: 12.5 g/dL (ref 12.0–15.0)
MCH: 33.7 pg (ref 26.0–34.0)
MCHC: 33.5 g/dL (ref 30.0–36.0)
MCV: 100.5 fL — ABNORMAL HIGH (ref 80.0–100.0)
Platelets: 211 10*3/uL (ref 150–400)
RBC: 3.71 MIL/uL — ABNORMAL LOW (ref 3.87–5.11)
RDW: 12.7 % (ref 11.5–15.5)
WBC: 8.6 10*3/uL (ref 4.0–10.5)
nRBC: 0 % (ref 0.0–0.2)

## 2021-02-01 LAB — PROTIME-INR
INR: 1 (ref 0.8–1.2)
Prothrombin Time: 12.9 seconds (ref 11.4–15.2)

## 2021-02-01 LAB — RAPID URINE DRUG SCREEN, HOSP PERFORMED
Amphetamines: NOT DETECTED
Barbiturates: NOT DETECTED
Benzodiazepines: NOT DETECTED
Cocaine: NOT DETECTED
Opiates: POSITIVE — AB
Tetrahydrocannabinol: NOT DETECTED

## 2021-02-01 LAB — BASIC METABOLIC PANEL
Anion gap: 12 (ref 5–15)
BUN: 25 mg/dL — ABNORMAL HIGH (ref 8–23)
CO2: 18 mmol/L — ABNORMAL LOW (ref 22–32)
Calcium: 8.8 mg/dL — ABNORMAL LOW (ref 8.9–10.3)
Chloride: 96 mmol/L — ABNORMAL LOW (ref 98–111)
Creatinine, Ser: 0.95 mg/dL (ref 0.44–1.00)
GFR, Estimated: >60 mL/min (ref >60)
Glucose, Bld: 112 mg/dL — ABNORMAL HIGH (ref 70–99)
Potassium: 3.4 mmol/L — ABNORMAL LOW (ref 3.5–5.1)
Sodium: 126 mmol/L — ABNORMAL LOW (ref 135–145)

## 2021-02-01 LAB — SODIUM, URINE, RANDOM: Sodium, Ur: 72 mmol/L

## 2021-02-01 LAB — DIFFERENTIAL
Abs Immature Granulocytes: 0.05 10*3/uL (ref 0.00–0.07)
Basophils Absolute: 0.1 10*3/uL (ref 0.0–0.1)
Basophils Relative: 1 %
Eosinophils Absolute: 0.1 10*3/uL (ref 0.0–0.5)
Eosinophils Relative: 1 %
Immature Granulocytes: 1 %
Lymphocytes Relative: 25 %
Lymphs Abs: 2.2 10*3/uL (ref 0.7–4.0)
Monocytes Absolute: 0.9 10*3/uL (ref 0.1–1.0)
Monocytes Relative: 10 %
Neutro Abs: 5.4 10*3/uL (ref 1.7–7.7)
Neutrophils Relative %: 62 %

## 2021-02-01 LAB — HEMOGLOBIN A1C
Hgb A1c MFr Bld: 5.8 % — ABNORMAL HIGH (ref 4.8–5.6)
Mean Plasma Glucose: 119.76 mg/dL

## 2021-02-01 LAB — BRAIN NATRIURETIC PEPTIDE: B Natriuretic Peptide: 139 pg/mL — ABNORMAL HIGH (ref 0.0–100.0)

## 2021-02-01 LAB — LACTIC ACID, PLASMA: Lactic Acid, Venous: 0.6 mmol/L (ref 0.5–1.9)

## 2021-02-01 LAB — TROPONIN I (HIGH SENSITIVITY): Troponin I (High Sensitivity): 10 ng/L (ref <18)

## 2021-02-01 LAB — ETHANOL: Alcohol, Ethyl (B): <10 mg/dL (ref <10)

## 2021-02-01 LAB — OSMOLALITY, URINE: Osmolality, Ur: 492 mOsm/kg (ref 300–900)

## 2021-02-01 MED ORDER — FLUTICASONE PROPIONATE 50 MCG/ACT NA SUSP
1.0000 | Freq: Every day | NASAL | Status: DC
  Administered 2021-02-02 – 2021-02-05 (×3): 1 via NASAL
  Filled 2021-02-01: qty 16

## 2021-02-01 MED ORDER — ONDANSETRON HCL 4 MG/2ML IJ SOLN
4.0000 mg | Freq: Once | INTRAMUSCULAR | Status: AC
  Administered 2021-02-01: 4 mg via INTRAVENOUS
  Filled 2021-02-01: qty 2

## 2021-02-01 MED ORDER — LEVOTHYROXINE SODIUM 50 MCG PO TABS
50.0000 ug | ORAL_TABLET | ORAL | Status: DC
  Administered 2021-02-03 – 2021-02-05 (×2): 50 ug via ORAL
  Filled 2021-02-01 (×3): qty 1

## 2021-02-01 MED ORDER — ONDANSETRON HCL 4 MG/2ML IJ SOLN: 4.0000 mg | Freq: Four times a day (QID) | INTRAMUSCULAR | Status: DC | PRN

## 2021-02-01 MED ORDER — POLYETHYLENE GLYCOL 3350 17 G PO PACK: 17.0000 g | PACK | Freq: Every day | ORAL | Status: DC | PRN

## 2021-02-01 MED ORDER — ACETAMINOPHEN 325 MG PO TABS
650.0000 mg | ORAL_TABLET | Freq: Four times a day (QID) | ORAL | Status: DC | PRN
  Administered 2021-02-02 – 2021-02-05 (×8): 650 mg via ORAL
  Filled 2021-02-01 (×9): qty 2

## 2021-02-01 MED ORDER — SODIUM CHLORIDE 0.9 % IV SOLN
100.0000 mL/h | INTRAVENOUS | Status: DC
  Administered 2021-02-01: 100 mL/h via INTRAVENOUS

## 2021-02-01 MED ORDER — MORPHINE SULFATE (PF) 4 MG/ML IV SOLN
4.0000 mg | Freq: Once | INTRAVENOUS | Status: AC
  Administered 2021-02-01: 4 mg via INTRAVENOUS
  Filled 2021-02-01: qty 1

## 2021-02-01 MED ORDER — ONDANSETRON HCL 4 MG/2ML IJ SOLN
INTRAMUSCULAR | Status: AC
  Filled 2021-02-01: qty 2

## 2021-02-01 MED ORDER — ROSUVASTATIN CALCIUM 5 MG PO TABS
5.0000 mg | ORAL_TABLET | Freq: Every day | ORAL | Status: DC
  Administered 2021-02-03 – 2021-02-05 (×3): 5 mg via ORAL
  Filled 2021-02-01 (×4): qty 1

## 2021-02-01 MED ORDER — METOPROLOL SUCCINATE ER 25 MG PO TB24
25.0000 mg | ORAL_TABLET | Freq: Every day | ORAL | Status: DC
  Administered 2021-02-02 – 2021-02-04 (×3): 25 mg via ORAL
  Filled 2021-02-01 (×4): qty 1

## 2021-02-01 MED ORDER — INSULIN ASPART 100 UNIT/ML ~~LOC~~ SOLN
0.0000 [IU] | Freq: Three times a day (TID) | SUBCUTANEOUS | Status: DC
  Administered 2021-02-02: 2 [IU] via SUBCUTANEOUS
  Administered 2021-02-03: 3 [IU] via SUBCUTANEOUS
  Administered 2021-02-04: 2 [IU] via SUBCUTANEOUS

## 2021-02-01 MED ORDER — SODIUM CHLORIDE 0.9 % IV BOLUS
500.0000 mL | Freq: Once | INTRAVENOUS | Status: AC
  Administered 2021-02-01: 500 mL via INTRAVENOUS

## 2021-02-01 MED ORDER — LEVOTHYROXINE SODIUM 100 MCG PO TABS: 100.0000 ug | ORAL_TABLET | Freq: Every day | ORAL | Status: DC

## 2021-02-01 MED ORDER — SODIUM CHLORIDE 0.9 % IV SOLN: 100.0000 mL/h | INTRAVENOUS | Status: AC

## 2021-02-01 MED ORDER — LEVOTHYROXINE SODIUM 100 MCG PO TABS
100.0000 ug | ORAL_TABLET | ORAL | Status: DC
  Administered 2021-02-02 – 2021-02-04 (×2): 100 ug via ORAL
  Filled 2021-02-01 (×2): qty 1

## 2021-02-01 MED ORDER — ROSUVASTATIN CALCIUM 5 MG PO TABS: 10.0000 mg | ORAL_TABLET | ORAL | Status: DC

## 2021-02-01 MED ORDER — CEPHALEXIN 250 MG PO CAPS: 500.0000 mg | ORAL_CAPSULE | Freq: Three times a day (TID) | ORAL | Status: DC

## 2021-02-01 MED ORDER — SODIUM CHLORIDE 0.9% FLUSH
3.0000 mL | Freq: Two times a day (BID) | INTRAVENOUS | Status: DC
  Administered 2021-02-03 – 2021-02-04 (×4): 3 mL via INTRAVENOUS

## 2021-02-01 MED ORDER — LORAZEPAM 2 MG/ML IJ SOLN
0.5000 mg | Freq: Once | INTRAMUSCULAR | Status: AC | PRN
  Administered 2021-02-01: 0.5 mg via INTRAVENOUS
  Filled 2021-02-01: qty 1

## 2021-02-01 MED ORDER — ENOXAPARIN SODIUM 40 MG/0.4ML ~~LOC~~ SOLN
40.0000 mg | SUBCUTANEOUS | Status: DC
  Administered 2021-02-02 – 2021-02-05 (×4): 40 mg via SUBCUTANEOUS
  Filled 2021-02-01 (×5): qty 0.4

## 2021-02-01 MED ORDER — ONDANSETRON HCL 4 MG PO TABS: 4.0000 mg | ORAL_TABLET | Freq: Four times a day (QID) | ORAL | Status: DC | PRN

## 2021-02-01 MED ORDER — ACETAMINOPHEN 650 MG RE SUPP: 650.0000 mg | Freq: Four times a day (QID) | RECTAL | Status: DC | PRN

## 2021-02-01 NOTE — H&P (Signed)
History and Physical    Carolynn Tuley ZOX:[id number] DOB: 1941-11-21 DOA: 02/01/2021  PCP: Josetta Huddle, MD  Patient coming from: Home via EMS   Chief Complaint:  Chief Complaint  Patient presents with  . Emesis     HPI:    80 year old female with past medical history of hypothyroidism, hypertension, depression, irritable bowel syndrome, peripheral vascular disease, gout, hyperlipidemia, COPD and asthma, non-insulin-dependent diabetes mellitus type 2, diastolic congestive heart failure (Echo 06/2017 EF 55-60%) was to [facility] emergency department via EMS after experiencing an episode of loss of consciousness at home.  Patient is a somewhat poor historian and the majority the history is been obtained from the daughter who is at the bedside.  According to the daughter, patient began to experience nausea, abdominal discomfort and bouts of watery diarrhea on Monday.    On Tuesday, patient continued to have progressively worsening symptoms, resulting in an episode of syncope while in the bathroom, striking her head on a space heater while unconscious.  EMS was contacted and the patient was brought in to [facility] emergency department at that time.  Patient underwent a work-up and was diagnosed with gastroenteritis with possible urinary tract infection.  CT head revealed no evidence of intracranial hemorrhage.  Patient was able to pass an oral challenge and was discharged home on oral Keflex.  In the days that followed, patient continued to exhibit poor oral intake, lower abdominal discomfort and continued bouts of watery nonbloody diarrhea occurring several times daily although the exact quantity is not clear.  Patient denies any fevers, recent travel, sick contacts, recent injection of undercooked food or recent contact with confirmed COVID-19 infection.  Patient reports that she was able to remain compliant with her regimen of Keflex.  Urine culture ended up growing  out E. coli that was pansensitive.  The morning of 2/27, the patient again was in her bathroom when she began to feel intense lightheadedness while having an episode of diarrhea.  Patient once again lost consciousness which was witnessed by her daughter who was escorting her to the bathroom.  Due to this recurrent bout of syncope EMS was contacted and the patient was promptly brought to [facility] emergency department for evaluation.  Upon evaluation in the emergency department patient underwent extensive work-up.  This work-up included x-ray imaging of the lower back, hips, CT imaging of the head as well as MRI brain which revealed no evidence of intracranial hemorrhage or ischemic stroke.  Urinalysis this time was unremarkable.  Patient did exhibit hyponatremia and metabolic acidosis with no evidence of dehydration.  Because of episode of syncope and multiple electrolyte abnormalities the hospitalist group was then called to assess the patient for admission to the hospital.  Review of Systems:   Review of Systems  Constitutional: Positive for malaise/fatigue.  Gastrointestinal: Positive for abdominal pain, diarrhea, nausea and vomiting.  Neurological: Positive for loss of consciousness and weakness.  All other systems reviewed and are negative.   Past Medical History:  Diagnosis Date  . Abnormal Pap smear of vagina   . Allergic sinusitis   . Anxiety   . Atherosclerotic peripheral vascular disease ([place])   . Carotid artery stenosis   . Cellulitis   . COPD with asthma ([place])    not an issue now  . Depression   . Diabetes mellitus type 2, uncomplicated ([place])   . DJD (degenerative joint disease)   . Elevated cholesterol   . Elevated MCV    secondary  to alchol  . GERD (gastroesophageal reflux disease)    not an issue now.  . Gout   . Hearing difficulty    bilateral hearing aids  . History of recurrent UTIs   . Hypertension    loss weight'off meds now"  . Hypothyroidism   .  IBS (irritable bowel syndrome)   . Insomnia   . Labial fusion   . Osteoarthritis of left knee   . Osteopenia   . Recurrent vaginitis   . Sjogren's syndrome ([place])   . Syncope 11.7.14   secondary to dehydration and possible hypoglycemia  . Tendonitis   . Thyroid disease   . Transfusion history    age 27 "anemia"    Past Surgical History:  Procedure Laterality Date  . CATARACT EXTRACTION, BILATERAL Bilateral   . CHOLECYSTECTOMY     open  . COLONOSCOPY WITH PROPOFOL N/A 03/23/2016   Procedure: COLONOSCOPY WITH PROPOFOL;  Surgeon: Garlan Fair, MD;  Location: [facility] ENDOSCOPY;  Service: Endoscopy;  Laterality: N/A;  . EYE SURGERY    . FOOT SURGERY Left    retained hardware  . KNEE SURGERY Left    scope   . TONSILLECTOMY       reports that she quit smoking about 38 years ago. She has never used smokeless tobacco. She reports that she does not drink alcohol and does not use drugs.  Allergies  Allergen Reactions  . Celexa [Citalopram Hydrobromide] Other (See Comments)    nightmares  . Fluconazole Other (See Comments)    Unknown reaction  . Green Tea Leaf Ext Other (See Comments)    Mouth ulcers  . Kenalog [Triamcinolone] Other (See Comments)    Spinal injection caused sweats  . Levaquin [Levofloxacin] Hives  . Losartan Potassium-Hctz Other (See Comments)    Night sweats/ fatigue  . Methocarbamol Other (See Comments)    500 mg is too much  . Nitrofurantoin Macrocrystal Other (See Comments)    Possible vertigo  . Scopolamine Other (See Comments)    Memory change  . Sulfa Antibiotics Other (See Comments)    Unknown reaction  . Trimethoprim Hives    Family History  Problem Relation Age of Onset  . Diabetes Maternal Aunt   . Cancer Father        Oral cancer  . Breast cancer Sister        Age 1's  . Cancer Brother        Lung cancer     Prior to Admission medications   Medication Sig Start Date End Date Taking? Authorizing Provider  acetaminophen (TYLENOL) 500  MG tablet Take 1,000 mg by mouth every 6 (six) hours as needed for mild pain.    [provider]  Ascorbic Acid (VITAMIN C) 500 MG CAPS Take 500 mg by mouth daily.    [provider]  betamethasone valerate ointment (VALISONE) 0.1 % Apply 1 application topically 2 (two) times daily. 02/14/18   Huel Cote, NP  Calcium Citrate 250 MG TABS Take 250 mg by mouth 2 (two) times daily as needed.    [provider]  cephALEXin (KEFLEX) 500 MG capsule Take 1 capsule (500 mg total) by mouth 3 (three) times daily for 7 days. 01/27/21 02/03/21  Truddie Hidden, MD  Cholecalciferol (VITAMIN D3) 1000 UNITS CAPS Take 1,000 mg by mouth daily.      [provider]  CRESTOR 10 MG tablet Take 10 mg by mouth as directed. Takes 10 mg Monday Wednesday and Friday,and  5 mg the rest of the week 11/08/15   [provider]  diclofenac sodium (VOLTAREN) 1 % GEL  03/29/19   [provider]  fluorometholone (FML) 0.1 % ophthalmic suspension  12/27/18   [provider]  fluticasone (FLONASE) 50 MCG/ACT nasal spray Place 1 spray into both nostrils daily.    [provider]  levothyroxine (SYNTHROID, LEVOTHROID) 100 MCG tablet Take 1 tablet by mouth daily before breakfast.  10/23/14   [provider]  loratadine-pseudoephedrine (CLARITIN-D 12-HOUR) 5-120 MG tablet Take by mouth.    [provider]  meloxicam (MOBIC) 15 MG tablet meloxicam 15 mg tablet    [provider]  metFORMIN (GLUCOPHAGE) 500 MG tablet Take 1 tablet by mouth daily. 02/20/19   [provider]  metoprolol succinate (TOPROL-XL) 25 MG 24 hr tablet Take 25 mg by mouth daily.    [provider]  nystatin cream (MYCOSTATIN) Apply 1 application topically 2 (two) times daily. Insert one applicatoful intravaginally hs x 3 nights. 10/30/19   Huel Cote, NP  nystatin-triamcinolone ointment [facility]) Apply 1 application topically 2 (two) times daily.  02/21/19   Huel Cote, NP  ondansetron (ZOFRAN ODT) 4 MG disintegrating tablet Take 1 tablet (4 mg total) by mouth every 8 (eight) hours as needed for nausea or vomiting. 01/27/21   Truddie Hidden, MD  psyllium (HYDROCIL/METAMUCIL) 95 % PACK Take 1 packet by mouth daily.    [provider]  RESTASIS 0.05 % ophthalmic emulsion Place 1 drop into both eyes 2 (two) times daily.  11/13/15   [provider]  terconazole (TERAZOL 7) 0.4 % vaginal cream Place 1 applicator vaginally at bedtime. 10/29/19   Huel Cote, NP  triamterene-hydrochlorothiazide (MAXZIDE-25) 37.5-25 MG tablet Take 1 tablet by mouth daily.    [provider]  vitamin E 400 UNIT capsule Take 400 Units by mouth daily.      [provider]    Physical Exam: Vitals:   02/01/21 2000 02/01/21 2015 02/01/21 2030 02/01/21 2101  BP: (!) 103/46 (!) 115/49 (!) 122/47   Pulse: 70 73 72   Resp: 11 10 13    Temp:    98.4 F (36.9 C)  TempSrc:    Oral  SpO2: 95% 95% 96%     Constitutional: Lethargic but arousable, oriented x3, no associated distress.   Skin: no rashes, no lesions, notable poor skin turgor noted. Eyes: Pupils are equally reactive to light.  No evidence of scleral icterus or conjunctival pallor.  ENMT: Dry mucous membranes noted.  Posterior pharynx clear of any exudate or lesions.   Neck: normal, supple, no masses, no thyromegaly.  No evidence of jugular venous distension.   Respiratory: clear to auscultation bilaterally, no wheezing, no crackles. Normal respiratory effort. No accessory muscle use.  Cardiovascular: Regular rate and rhythm, no murmurs / rubs / gallops. No extremity edema. 2+ pedal pulses. No carotid bruits.  Chest:   Nontender without crepitus or deformity.   Back:   Nontender without crepitus or deformity. Abdomen: Notable hyperactive bowel sounds.  Abdomen is soft with lower abdominal tenderness.   No evidence of intra-abdominal masses.   Musculoskeletal: No  joint deformity upper and lower extremities. Good ROM, no contractures. Normal muscle tone.  Neurologic: Lethargic but arousable, oriented x3.  CN 2-12 grossly intact. Sensation intact.  Patient moving all 4 extremities spontaneously.  Patient is following all commands.  Patient is responsive to verbal stimuli.   Psychiatric: Patient exhibits depressed mood  with flat affect.  Patient seems to possess insight as to their current situation.     Labs on Admission: I have personally reviewed following labs and imaging studies -   CBC: Recent Labs  Lab 01/27/21 1355 02/01/21 1309 02/01/21 1335  WBC 8.5 TEST CANCELLED PER RN 8.6  NEUTROABS  --   --  5.4  HGB 11.6* TEST CANCELLED PER RN 12.5  HCT 35.7* TEST CANCELLED PER RN 37.3  MCV 102.9* TEST CANCELLED PER RN 100.5*  PLT 169 TEST CANCELLED PER RN 681   Basic Metabolic Panel: Recent Labs  Lab 01/27/21 1355 02/01/21 1309  NA 131* 128*  K 3.9 3.9  CL 101 98  CO2 18* 16*  GLUCOSE 156* 168*  BUN 28* 34*  CREATININE 1.02* 1.08*  CALCIUM 8.9 9.6   GFR: Estimated Creatinine Clearance: 31.6 mL/min (A) (by C-G formula based on SCr of 1.08 mg/dL (H)). Liver Function Tests: Recent Labs  Lab 01/27/21 1355 02/01/21 1309  AST 24 27  ALT 15 14  ALKPHOS 46 46  BILITOT 1.0 1.1  PROT 6.5 6.9  ALBUMIN 4.1 4.2   Recent Labs  Lab 01/27/21 1355 02/01/21 1309  LIPASE 32 30   No results for input(s): AMMONIA in the last 168 hours. Coagulation Profile: Recent Labs  Lab 02/01/21 1335  INR 1.0   Cardiac Enzymes: No results for input(s): CKTOTAL, CKMB, CKMBINDEX, TROPONINI in the last 168 hours. BNP (last 3 results) Recent Labs    01/21/21 1223  PROBNP 551   HbA1C: No results for input(s): HGBA1C in the last 72 hours. CBG: No results for input(s): GLUCAP in the last 168 hours. Lipid Profile: No results for input(s): CHOL, HDL, LDLCALC, TRIG, CHOLHDL, LDLDIRECT in the last 72 hours. Thyroid Function Tests: No results for  input(s): TSH, T4TOTAL, FREET4, T3FREE, THYROIDAB in the last 72 hours. Anemia Panel: No results for input(s): VITAMINB12, FOLATE, FERRITIN, TIBC, IRON, RETICCTPCT in the last 72 hours. Urine analysis:    Component Value Date/Time   COLORURINE YELLOW 02/01/2021 1819   APPEARANCEUR CLEAR 02/01/2021 1819   LABSPEC 1.013 02/01/2021 1819   PHURINE 5.0 02/01/2021 1819   GLUCOSEU NEGATIVE 02/01/2021 1819   HGBUR NEGATIVE 02/01/2021 1819   BILIRUBINUR NEGATIVE 02/01/2021 1819   KETONESUR NEGATIVE 02/01/2021 1819   PROTEINUR NEGATIVE 02/01/2021 1819   UROBILINOGEN 0.2 01/22/2015 1100   NITRITE NEGATIVE 02/01/2021 1819   LEUKOCYTESUR NEGATIVE 02/01/2021 1819    Radiological Exams on Admission - Personally Reviewed: DG Lumbar Spine Complete  Result Date: 02/01/2021 CLINICAL DATA:  Syncopal episode 5 days ago EXAM: LUMBAR SPINE - COMPLETE 4+ VIEW COMPARISON:  August 08, 2020 FINDINGS: There are five non-rib bearing lumbar-type vertebral bodies. Levocurvature of the lumbar spine and dextrocurvature of the thoracolumbar junction, unchanged. Unchanged grade 1 anterolisthesis of L4-5. Unchanged mild wedging of T12. There is no evidence for acute fracture or subluxation. Severe intervertebral disc space height loss of T11-12, L3-4 and L2-3. Moderate intervertebral disc space height loss of L4-5 and L5-S1. Lower lumbar facet arthropathy. Atherosclerotic calcifications of the aorta. Surgical clips project over the RIGHT upper quadrant. IMPRESSION: 1. No acute osseous abnormality. 2. Unchanged multilevel degenerative disc disease, severe at T11-12, L3-4 and L2-3. 3. Unchanged grade 1 anterolisthesis of L4-5. Electronically Signed   By: Valentino Saxon MD   On: 02/01/2021 17:12   CT HEAD WO CONTRAST  Result Date: 02/01/2021 CLINICAL DATA:  Syncopal episode 5 days ago, left eye bruising, persistent nausea vomiting. EXAM: CT HEAD WITHOUT  CONTRAST TECHNIQUE: Contiguous axial images were obtained from the  base of the skull through the vertex without intravenous contrast. COMPARISON:  Head CT January 27, 2021 FINDINGS: Brain: No evidence of acute large vascular territory infarction, hemorrhage, hydrocephalus, extra-axial collection or mass lesion/mass effect. Mild parenchymal volume loss with ex vacuo dilatation of ventricular system. Similar periventricular white matter hypoattenuation, which likely represents chronic small vessel ischemic disease. Vascular: No hyperdense vessel. Vascular calcifications at the skull base. Skull: No acute osseous abnormality. Sinuses/Orbits: No acute finding. Other: Decreased left face and periorbital soft tissue swelling IMPRESSION: 1. No acute intracranial findings. 2. Decreased left face and periorbital soft tissue swelling. 3. Similar mild parenchymal volume loss and chronic small vessel ischemic disease. Electronically Signed   By: Dahlia Bailiff MD   On: 02/01/2021 14:16   MR BRAIN WO CONTRAST  Result Date: 02/01/2021 CLINICAL DATA:  Neuro deficit, acute, stroke suspected EXAM: MRI HEAD WITHOUT CONTRAST TECHNIQUE: Multiplanar, multiecho pulse sequences of the brain and surrounding structures were obtained without intravenous contrast. COMPARISON:  02/01/2021 and prior. FINDINGS: Brain: No diffusion-weighted signal abnormality. No intracranial hemorrhage. No midline shift, ventriculomegaly or extra-axial fluid collection. No mass lesion. Mild cerebral atrophy with ex vacuo dilatation. Moderate chronic microvascular ischemic changes. Chronic right cerebellar lacunar insult. Vascular: Proximally preserved major intracranial flow voids. Skull and upper cervical spine: Normal marrow signal. Sinuses/Orbits: Sequela of bilateral lens replacement. Globes are intact. Decreased left periorbital soft tissue swelling. Pneumatized paranasal sinuses. Small right mastoid effusion. Other: None. IMPRESSION: No acute intracranial process. Chronic right cerebellar lacunar insult. Mild  cerebral atrophy. Moderate chronic microvascular ischemic changes. Electronically Signed   By: Primitivo Gauze M.D.   On: 02/01/2021 19:08   DG Hips Bilat W or Wo Pelvis 2 Views  Result Date: 02/01/2021 CLINICAL DATA:  Syncope EXAM: DG HIP (WITH OR WITHOUT PELVIS) 2V BILAT COMPARISON:  Apr 18, 2019 FINDINGS: Osteopenia. No acute fracture or dislocation. Degenerative changes of the lower lumbar spine. Hip joints are relatively preserved. No area of erosion or osseous destruction. No unexpected radiopaque foreign body. Vascular calcifications. IMPRESSION: No acute osseous abnormality. If persistent concern for nondisplaced hip or pelvic fracture, recommend dedicated pelvic MRI. Electronically Signed   By: Valentino Saxon MD   On: 02/01/2021 17:13    EKG: Personally reviewed.  Rhythm is normal sinus rhythm with heart rate of 63 bpm.  No dynamic ST segment changes appreciated.  Assessment/Plan Principal Problem:   Syncope   Patient presenting with 2 episodes of syncope in the past 5 days..  Syncope is possibly vasovagal considering patient has experienced both episodes while in the bathroom, alternatively it may be orthostatic considering ongoing diarrhea  Monitoring patient on telemetry  Obtaining troponin  Obtaining echocardiogram  Holding triamterene/hydrochlorothiazide  Hydrating patient with intravenous isotonic fluids  Obtaining serial orthostatic vital signs  PT evaluation in the morning  Active Problems:   E-coli UTI   Diagnosis of E. coli UTI on 2/22 presentation to the emergency department  Patient is already completed 4 days of Keflex  Repeat urinalysis today is completely unremarkable  antibiotic therapy can likely be discontinued at this time  Acute diarrhea   Patient has been experiencing a 6-day history of watery diarrhea, several episodes daily  Unlikely to be C. difficile considering no recent history of antibiotic use onset of symptoms but  considering ongoing substantial symptoms we will send off a sample stool with next bowel movement  Also question whether this is truly gastroenteritis (Dx on 2/22  presentation) due to persisting symptoms after 6 days  Clear liquid diet for now  Abdomen is tender on examination with evidence of metabolic acidosis -we will obtain lactic acid and CT imaging of the abdomen pelvis.  Supportive care    Metabolic acidosis   Notable metabolic acidosis on chemistry on presentation  Unlikely to be diabetic ketoacidosis considering mild hyperglycemia no evidence of ketonuria  Pending lactic acid level  Monitoring with serial chemistries  Hydrating with intravenous fluids    Essential hypertension, benign   Holding all home antihypertensives with exception of metoprolol due to clinical volume depletion and hyponatremia  We will resume home antihypertensives once able as blood pressure    Type 2 diabetes mellitus with hyperglycemia, without long-term current use of insulin (HCC)   Holding home regimen of Metformin  Accu-Cheks before every meal and nightly with sliding scale insulin  Diabetic diet  Hemoglobin A1c pending  Will consider initiation of basal bolus insulin therapy if patient remains hyperglycemic    Hypothyroidism   Continue home regimen of Synthroid    Mixed diabetic hyperlipidemia associated with type 2 diabetes mellitus ([place])   Continue home regimen of statin therapy    Dehydration with hyponatremia   Patient exhibiting modest hyponatremia in the setting of a 6-day history of diarrhea and poor oral intake  Hydrating patient with intravenous isotonic fluids  Monitoring symptoms with serial chemistries  Holding thiazide diuretics    Chronic diastolic CHF (congestive heart failure) (HCC)   Mild diastolic dysfunction based on review of echocardiogram from 2018  No evidence of cardiogenic volume overload at this time     Code Status:  Full  code Family Communication: Daughter is at bedside who has been updated on plan of care  Status is: Observation  The patient remains OBS appropriate and will d/c before 2 midnights.  Dispo: The patient is from: Home              Anticipated d/c is to: Home              Patient currently is not medically stable to d/c.   Difficult to place patient No        Vernelle Emerald MD [facility] Pager [phone]  If 7PM-7AM, please contact night-coverage [email or web address] Use universal [place] password for that web site. If you do not have the password, please call the hospital operator.  02/01/2021, 9:31 PM

## 2021-02-01 NOTE — ED Notes (Signed)
Attempted to call report, unsuccessful, will try again.

## 2021-02-01 NOTE — ED Triage Notes (Signed)
Pt BIBA from home with persistent nausea and vomiting. Pt seen 5 days ago and started on abx for UTI. Pt also dx with gastritis. Pt has old bruising assessed to left eye from a syncopal episode 5 days ago. Pt is poor historian and unable to provide much hx. Pt rec'd 4mg  IM Zofran en route.

## 2021-02-01 NOTE — ED Provider Notes (Signed)
[facility] EMERGENCY DEPARTMENT Provider Note   CSN: [id number] Arrival date & time: 02/01/21  1255     History Chief Complaint  Patient presents with  . Emesis    Christina Sellers is a 80 y.o. female.  HPI   Patient presents to the emergency room for evaluation of persistent nausea and vomiting.  History is somewhat difficult as the patient seems to be changing her answers as I try to clarify.  Patient states she was seen in the emergency room about 5 days ago after an episode of syncope.  Patient states her symptoms were preceded by several episodes of watery diarrhea in the morning.  As the day progressed she started to feel lightheaded and dizzy.  Patient then had a syncopal episode and was found on the floor next to the toilet.  She did injure her face and head and developed a skin tear on her arm.  Patient had an evaluation in the ED that included laboratory tests and CT scans.  Patient ultimately was discharged home.  Patient initially said that she felt no better after she left the hospital but then told me that she did get better for a couple of days and did manage to see her doctor.  Patient states in the last 2 days however her symptoms have returned.  Initially she told me she had not been having diarrhea but then she said she has been having diarrhea.  Patient states her last episode with diarrhea was prior to arrival.  Patient has been having episodes of nausea and vomiting.  Patient states she does get lightheaded.  She is denying any vertigo sensation however she does not feel like she can open her eyes.  When she does it triggers more nausea and vomiting for her.  She denies any trouble with focal numbness or weakness.  No trouble with her speech.  She does feel weak as if she is going to faint again. Patient does have some pain in the lower back.  Patient states this is a chronic issue and not new associated with her fall.   Past Medical History:   Diagnosis Date  . Abnormal Pap smear of vagina   . Allergic sinusitis   . Anxiety   . Atherosclerotic peripheral vascular disease ([place])   . Carotid artery stenosis   . Cellulitis   . COPD with asthma ([place])    not an issue now  . Depression   . Diabetes mellitus type 2, uncomplicated ([place])   . DJD (degenerative joint disease)   . Elevated cholesterol   . Elevated MCV    secondary to alchol  . GERD (gastroesophageal reflux disease)    not an issue now.  . Gout   . Hearing difficulty    bilateral hearing aids  . History of recurrent UTIs   . Hypertension    loss weight'off meds now"  . Hypothyroidism   . IBS (irritable bowel syndrome)   . Insomnia   . Labial fusion   . Osteoarthritis of left knee   . Osteopenia   . Recurrent vaginitis   . Sjogren's syndrome ([place])   . Syncope 11.7.14   secondary to dehydration and possible hypoglycemia  . Tendonitis   . Thyroid disease   . Transfusion history    age 38 "anemia"    Patient Active Problem List   Diagnosis Date Noted  . Preoperative cardiovascular examination 01/21/2021  . Bilateral edema of lower extremity 01/21/2021  . Hypertension associated with  diabetes ([place]) 01/21/2021  . Hyperlipidemia associated with type 2 diabetes mellitus ([place]) 01/21/2021  . Aortic atherosclerosis ([place]) 01/21/2021  . Low back pain 10/05/2019  . Diabetes type 2, controlled ([place]) 02/21/2019  . Hypothyroidism 02/21/2019  . Pain of left hip joint 12/08/2018  . Dermatochalasis of both eyelids 07/04/2018  . Viral labyrinthitis 01/13/2017  . Atherosclerotic peripheral vascular disease ([place])   . Essential hypertension, benign 11/22/2013  . Osteopenia 11/22/2013  . Labial fusion     Past Surgical History:  Procedure Laterality Date  . CATARACT EXTRACTION, BILATERAL Bilateral   . CHOLECYSTECTOMY     open  . COLONOSCOPY WITH PROPOFOL N/A 03/23/2016   Procedure: COLONOSCOPY WITH PROPOFOL;  Surgeon: Garlan Fair, MD;  Location: [facility] ENDOSCOPY;   Service: Endoscopy;  Laterality: N/A;  . EYE SURGERY    . FOOT SURGERY Left    retained hardware  . KNEE SURGERY Left    scope   . TONSILLECTOMY       OB History    Gravida  2   Para  2   Term  2   Preterm      AB      Living  3     SAB      IAB      Ectopic      Multiple  1   Live Births              Family History  Problem Relation Age of Onset  . Diabetes Maternal Aunt   . Cancer Father        Oral cancer  . Breast cancer Sister        Age 75's  . Cancer Brother        Lung cancer    Social History   Tobacco Use  . Smoking status: Former Smoker    Quit date: 02/21/1982    Years since quitting: 38.9  . Smokeless tobacco: Never Used  Vaping Use  . Vaping Use: Never used  Substance Use Topics  . Alcohol use: No  . Drug use: No    Home Medications Prior to Admission medications   Medication Sig Start Date End Date Taking? Authorizing Provider  acetaminophen (TYLENOL) 500 MG tablet Take 1,000 mg by mouth every 6 (six) hours as needed for mild pain.    [provider]  Ascorbic Acid (VITAMIN C) 500 MG CAPS Take 500 mg by mouth daily.    [provider]  betamethasone valerate ointment (VALISONE) 0.1 % Apply 1 application topically 2 (two) times daily. 02/14/18   Huel Cote, NP  Calcium Citrate 250 MG TABS Take 250 mg by mouth 2 (two) times daily as needed.    [provider]  cephALEXin (KEFLEX) 500 MG capsule Take 1 capsule (500 mg total) by mouth 3 (three) times daily for 7 days. 01/27/21 02/03/21  Truddie Hidden, MD  Cholecalciferol (VITAMIN D3) 1000 UNITS CAPS Take 1,000 mg by mouth daily.      [provider]  CRESTOR 10 MG tablet Take 10 mg by mouth as directed. Takes 10 mg Monday Wednesday and Friday,and 5 mg the rest of the week 11/08/15   [provider]  diclofenac sodium (VOLTAREN) 1 % GEL  03/29/19   [provider]  fluorometholone (FML) 0.1 % ophthalmic suspension  12/27/18    [provider]  fluticasone (FLONASE) 50 MCG/ACT nasal spray Place 1 spray into both nostrils daily.    [provider]  levothyroxine (SYNTHROID, LEVOTHROID) 100 MCG tablet Take 1 tablet by mouth daily before breakfast.  10/23/14   [provider]  loratadine-pseudoephedrine (CLARITIN-D 12-HOUR) 5-120 MG tablet Take by mouth.    [provider]  meloxicam (MOBIC) 15 MG tablet meloxicam 15 mg tablet    [provider]  metFORMIN (GLUCOPHAGE) 500 MG tablet Take 1 tablet by mouth daily. 02/20/19   [provider]  metoprolol succinate (TOPROL-XL) 25 MG 24 hr tablet Take 25 mg by mouth daily.    [provider]  nystatin cream (MYCOSTATIN) Apply 1 application topically 2 (two) times daily. Insert one applicatoful intravaginally hs x 3 nights. 10/30/19   Huel Cote, NP  nystatin-triamcinolone ointment [facility]) Apply 1 application topically 2 (two) times daily. 02/21/19   Huel Cote, NP  ondansetron (ZOFRAN ODT) 4 MG disintegrating tablet Take 1 tablet (4 mg total) by mouth every 8 (eight) hours as needed for nausea or vomiting. 01/27/21   Truddie Hidden, MD  psyllium (HYDROCIL/METAMUCIL) 95 % PACK Take 1 packet by mouth daily.    [provider]  RESTASIS 0.05 % ophthalmic emulsion Place 1 drop into both eyes 2 (two) times daily.  11/13/15   [provider]  terconazole (TERAZOL 7) 0.4 % vaginal cream Place 1 applicator vaginally at bedtime. 10/29/19   Huel Cote, NP  triamterene-hydrochlorothiazide (MAXZIDE-25) 37.5-25 MG tablet Take 1 tablet by mouth daily.    [provider]  vitamin E 400 UNIT capsule Take 400 Units by mouth daily.      [provider]    Allergies    Celexa [citalopram hydrobromide], Fluconazole, Green tea leaf ext, Levaquin [levofloxacin in d5w], Losartan, Nitrofurantoin macrocrystal, Scopolamine, Sulfa antibiotics, Trimethoprim, and Triamcinolone acetonide  Review  of Systems   Review of Systems  All other systems reviewed and are negative.   Physical Exam Updated Vital Signs BP (!) 149/61   Pulse 76   Temp 97.9 F (36.6 C) (Oral)   Resp 13   SpO2 96%   Physical Exam Vitals and nursing note reviewed.  Constitutional:      Appearance: She is well-developed and well-nourished. She is ill-appearing.  HENT:     Head: Normocephalic.     Comments: Old bruising noted around left eye    Right Ear: External ear normal.     Left Ear: External ear normal.  Eyes:     General: No scleral icterus.       Right eye: No discharge.        Left eye: No discharge.     Conjunctiva/sclera: Conjunctivae normal.     Pupils: Pupils are equal, round, and reactive to light.  Neck:     Trachea: No tracheal deviation.  Cardiovascular:     Rate and Rhythm: Normal rate and regular rhythm.     Pulses: Intact distal pulses.  Pulmonary:     Effort: Pulmonary effort is normal. No respiratory distress.     Breath sounds: Normal breath sounds. No stridor. No wheezing or rales.  Abdominal:     General: Bowel sounds are normal. There is no distension.     Palpations: Abdomen is soft.     Tenderness: There is no abdominal tenderness. There is no guarding or rebound.  Musculoskeletal:        General: No tenderness or edema.     Cervical back: Neck supple.     Comments: No cervical spine tenderness, bandage noted over the left arm  Skin:  General: Skin is warm and dry.     Findings: No rash.  Neurological:     Mental Status: She is alert.     Cranial Nerves: No cranial nerve deficit (no facial droop, extraocular movements intact, no slurred speech).     Sensory: No sensory deficit.     Motor: Weakness present. No abnormal muscle tone or seizure activity.     Deep Tendon Reflexes: Strength normal.     Comments: Patient unable to comply with finger-nose exam as opening her eyes makes her feel dizzy no speech deficit, no slurred speech, generalized weakness but able  to lift both arms off the bed, equal grip strength, able to lift both legs off the bed  Psychiatric:        Mood and Affect: Mood and affect normal.     ED Results / Procedures / Treatments   Labs (all labs ordered are listed, but only abnormal results are displayed) Labs Reviewed  COMPREHENSIVE METABOLIC PANEL - Abnormal; Notable for the following components:      Result Value   Sodium 128 (*)    CO2 16 (*)    Glucose, Bld 168 (*)    BUN 34 (*)    Creatinine, Ser 1.08 (*)    GFR, Estimated 52 (*)    All other components within normal limits  CBC - Abnormal; Notable for the following components:   RBC 3.71 (*)    MCV 100.5 (*)    All other components within normal limits  RESP PANEL BY RT-PCR (FLU A&B, COVID) ARPGX2  LIPASE, BLOOD  CBC  URINALYSIS, ROUTINE W REFLEX MICROSCOPIC  ETHANOL  PROTIME-INR  APTT  DIFFERENTIAL  RAPID URINE DRUG SCREEN, HOSP PERFORMED    EKG EKG Interpretation  Date/Time:  Sunday February 01 2021 13:14:26 EST Ventricular Rate:  63 PR Interval:    QRS Duration: 136 QT Interval:  467 QTC Calculation: 479 R Axis:   -13 Text Interpretation: Sinus rhythm Prolonged PR interval Nonspecific intraventricular conduction delay Nonspecific T abnrm, anterolateral leads No significant change since last tracing Confirmed by Dorie Rank [phone]) on 02/01/2021 1:18:33 PM Also confirmed by Dorie Rank [phone]), editor Hattie Perch [phone])  on 02/01/2021 2:38:28 PM   Radiology DG Lumbar Spine Complete  Result Date: 02/01/2021 CLINICAL DATA:  Syncopal episode 5 days ago EXAM: LUMBAR SPINE - COMPLETE 4+ VIEW COMPARISON:  August 08, 2020 FINDINGS: There are five non-rib bearing lumbar-type vertebral bodies. Levocurvature of the lumbar spine and dextrocurvature of the thoracolumbar junction, unchanged. Unchanged grade 1 anterolisthesis of L4-5. Unchanged mild wedging of T12. There is no evidence for acute fracture or subluxation. Severe intervertebral disc space  height loss of T11-12, L3-4 and L2-3. Moderate intervertebral disc space height loss of L4-5 and L5-S1. Lower lumbar facet arthropathy. Atherosclerotic calcifications of the aorta. Surgical clips project over the RIGHT upper quadrant. IMPRESSION: 1. No acute osseous abnormality. 2. Unchanged multilevel degenerative disc disease, severe at T11-12, L3-4 and L2-3. 3. Unchanged grade 1 anterolisthesis of L4-5. Electronically Signed   By: Valentino Saxon MD   On: 02/01/2021 17:12   CT HEAD WO CONTRAST  Result Date: 02/01/2021 CLINICAL DATA:  Syncopal episode 5 days ago, left eye bruising, persistent nausea vomiting. EXAM: CT HEAD WITHOUT CONTRAST TECHNIQUE: Contiguous axial images were obtained from the base of the skull through the vertex without intravenous contrast. COMPARISON:  Head CT January 27, 2021 FINDINGS: Brain: No evidence of acute large vascular territory infarction, hemorrhage, hydrocephalus, extra-axial collection or mass lesion/mass  effect. Mild parenchymal volume loss with ex vacuo dilatation of ventricular system. Similar periventricular white matter hypoattenuation, which likely represents chronic small vessel ischemic disease. Vascular: No hyperdense vessel. Vascular calcifications at the skull base. Skull: No acute osseous abnormality. Sinuses/Orbits: No acute finding. Other: Decreased left face and periorbital soft tissue swelling IMPRESSION: 1. No acute intracranial findings. 2. Decreased left face and periorbital soft tissue swelling. 3. Similar mild parenchymal volume loss and chronic small vessel ischemic disease. Electronically Signed   By: Dahlia Bailiff MD   On: 02/01/2021 14:16   MR BRAIN WO CONTRAST  Result Date: 02/01/2021 CLINICAL DATA:  Neuro deficit, acute, stroke suspected EXAM: MRI HEAD WITHOUT CONTRAST TECHNIQUE: Multiplanar, multiecho pulse sequences of the brain and surrounding structures were obtained without intravenous contrast. COMPARISON:  02/01/2021 and prior.  FINDINGS: Brain: No diffusion-weighted signal abnormality. No intracranial hemorrhage. No midline shift, ventriculomegaly or extra-axial fluid collection. No mass lesion. Mild cerebral atrophy with ex vacuo dilatation. Moderate chronic microvascular ischemic changes. Chronic right cerebellar lacunar insult. Vascular: Proximally preserved major intracranial flow voids. Skull and upper cervical spine: Normal marrow signal. Sinuses/Orbits: Sequela of bilateral lens replacement. Globes are intact. Decreased left periorbital soft tissue swelling. Pneumatized paranasal sinuses. Small right mastoid effusion. Other: None. IMPRESSION: No acute intracranial process. Chronic right cerebellar lacunar insult. Mild cerebral atrophy. Moderate chronic microvascular ischemic changes. Electronically Signed   By: Primitivo Gauze M.D.   On: 02/01/2021 19:08   DG Hips Bilat W or Wo Pelvis 2 Views  Result Date: 02/01/2021 CLINICAL DATA:  Syncope EXAM: DG HIP (WITH OR WITHOUT PELVIS) 2V BILAT COMPARISON:  Apr 18, 2019 FINDINGS: Osteopenia. No acute fracture or dislocation. Degenerative changes of the lower lumbar spine. Hip joints are relatively preserved. No area of erosion or osseous destruction. No unexpected radiopaque foreign body. Vascular calcifications. IMPRESSION: No acute osseous abnormality. If persistent concern for nondisplaced hip or pelvic fracture, recommend dedicated pelvic MRI. Electronically Signed   By: Valentino Saxon MD   On: 02/01/2021 17:13    Procedures Procedures   Medications Ordered in ED Medications  sodium chloride 0.9 % bolus 500 mL (500 mLs Intravenous New Bag/Given 02/01/21 1442)    Followed by  0.9 %  sodium chloride infusion (100 mL/hr Intravenous New Bag/Given 02/01/21 1557)  ondansetron (ZOFRAN) injection 4 mg (4 mg Intravenous Not Given 02/01/21 1447)  morphine 4 MG/ML injection 4 mg (4 mg Intravenous Given 02/01/21 1441)  LORazepam (ATIVAN) injection 0.5 mg (0.5 mg Intravenous  Given 02/01/21 1806)  ondansetron (ZOFRAN) injection 4 mg (4 mg Intravenous Given 02/01/21 1554)    ED Course  I have reviewed the triage vital signs and the nursing notes.  Pertinent labs & imaging results that were available during my care of the patient were reviewed by me and considered in my medical decision making (see chart for details).  Clinical Course as of 02/01/21 1926  Nancy Fetter Feb 01, 2021  1337 Discussed findings with daughter.  She did have another near synocpal episode this am.  Is having pain in her back into her hip.  Would still like to get xrays.  Pain meds ordered [JK]  1428 Pt refused her xrays [JK]  1429 Hyponatremia decreased from 131 5 days ago [JK]  1429 Head CT without acute findings. [JK]  1446 Urine culture from September 22 reviewed.  Positive for E. coli [JK]  1538 Patient still having nausea.  Requested additional medications. [KW]  4097 X-rays without acute findings. [JK]  1917 MRI without  signs of acute stroke [JK]    Clinical Course User Index [JK] Dorie Rank, MD   MDM Rules/Calculators/A&P                          Patient presented to the ED for evaluation of persistent nausea vomiting diarrhea and recurrent syncope. She was initially seen for this on February 22. Patient was noted to have a UTI but ultimately discharged home with her daughter who is a Electrical engineer. Symptoms unfortunately persisted and the patient had another near syncopal episode today prompting family to call EMS. In the ED initially patient states she was not able to open her eyes. She was having persistent nausea. Laboratory tests did show evidence of hyponatremia, metabolic acidosis and dehydration. With the patient's complaint of recurrent syncope vomiting and dizziness is concerned about the possibility of an occult stroke. I was also concerned about possible occult subdural that was not evident on initial imaging tests. CT scan was negative but follow-up MRI study was performed  and fortunately there is no evidence of stroke. Patient's labs do show also hyponatremia and metabolic acidosis. I believe this is related to her diarrhea. Considering her recurrent syncopal episodes I will consult the medical service for admission and further treatment.  Final Clinical Impression(s) / ED Diagnoses Final diagnoses:  Vomiting and diarrhea  Near syncope  Dehydration  Chronic back pain, unspecified back location, unspecified back pain laterality  Acute cystitis without hematuria  Metabolic acidosis  Hyponatremia     Dorie Rank, MD 02/01/21 [phone]

## 2021-02-01 NOTE — ED Notes (Signed)
Dr. Shalhoub at bedside  

## 2021-02-02 ENCOUNTER — Observation Stay (HOSPITAL_BASED_OUTPATIENT_CLINIC_OR_DEPARTMENT_OTHER): Payer: Medicare Other

## 2021-02-02 DIAGNOSIS — M549 Dorsalgia, unspecified: Secondary | ICD-10-CM

## 2021-02-02 DIAGNOSIS — G8929 Other chronic pain: Secondary | ICD-10-CM

## 2021-02-02 DIAGNOSIS — I361 Nonrheumatic tricuspid (valve) insufficiency: Secondary | ICD-10-CM | POA: Diagnosis not present

## 2021-02-02 DIAGNOSIS — I5032 Chronic diastolic (congestive) heart failure: Secondary | ICD-10-CM | POA: Diagnosis not present

## 2021-02-02 DIAGNOSIS — I340 Nonrheumatic mitral (valve) insufficiency: Secondary | ICD-10-CM

## 2021-02-02 DIAGNOSIS — E872 Acidosis: Secondary | ICD-10-CM | POA: Diagnosis not present

## 2021-02-02 DIAGNOSIS — E871 Hypo-osmolality and hyponatremia: Secondary | ICD-10-CM | POA: Diagnosis not present

## 2021-02-02 DIAGNOSIS — I10 Essential (primary) hypertension: Secondary | ICD-10-CM | POA: Diagnosis not present

## 2021-02-02 DIAGNOSIS — E782 Mixed hyperlipidemia: Secondary | ICD-10-CM | POA: Diagnosis not present

## 2021-02-02 DIAGNOSIS — E1169 Type 2 diabetes mellitus with other specified complication: Secondary | ICD-10-CM | POA: Diagnosis not present

## 2021-02-02 DIAGNOSIS — R197 Diarrhea, unspecified: Secondary | ICD-10-CM | POA: Diagnosis not present

## 2021-02-02 DIAGNOSIS — N390 Urinary tract infection, site not specified: Secondary | ICD-10-CM | POA: Diagnosis not present

## 2021-02-02 DIAGNOSIS — R55 Syncope and collapse: Secondary | ICD-10-CM

## 2021-02-02 LAB — GLUCOSE, CAPILLARY
Glucose-Capillary: 88 mg/dL (ref 70–99)
Glucose-Capillary: 137 mg/dL — ABNORMAL HIGH (ref 70–99)
Glucose-Capillary: 84 mg/dL (ref 70–99)
Glucose-Capillary: 121 mg/dL — ABNORMAL HIGH (ref 70–99)

## 2021-02-02 LAB — COMPREHENSIVE METABOLIC PANEL
ALT: 12 U/L (ref 0–44)
AST: 18 U/L (ref 15–41)
Albumin: 3.3 g/dL — ABNORMAL LOW (ref 3.5–5.0)
Alkaline Phosphatase: 41 U/L (ref 38–126)
Anion gap: 7 (ref 5–15)
BUN: 23 mg/dL (ref 8–23)
CO2: 21 mmol/L — ABNORMAL LOW (ref 22–32)
Calcium: 8.6 mg/dL — ABNORMAL LOW (ref 8.9–10.3)
Chloride: 103 mmol/L (ref 98–111)
Creatinine, Ser: 0.93 mg/dL (ref 0.44–1.00)
GFR, Estimated: >60 mL/min (ref >60)
Glucose, Bld: 93 mg/dL (ref 70–99)
Potassium: 3.5 mmol/L (ref 3.5–5.1)
Sodium: 131 mmol/L — ABNORMAL LOW (ref 135–145)
Total Bilirubin: 1.2 mg/dL (ref 0.3–1.2)
Total Protein: 5.6 g/dL — ABNORMAL LOW (ref 6.5–8.1)

## 2021-02-02 LAB — CBC WITH DIFFERENTIAL/PLATELET
Abs Immature Granulocytes: 0.03 10*3/uL (ref 0.00–0.07)
Basophils Absolute: 0 10*3/uL (ref 0.0–0.1)
Basophils Relative: 1 %
Eosinophils Absolute: 0 10*3/uL (ref 0.0–0.5)
Eosinophils Relative: 0 %
HCT: 31.1 % — ABNORMAL LOW (ref 36.0–46.0)
Hemoglobin: 10.6 g/dL — ABNORMAL LOW (ref 12.0–15.0)
Immature Granulocytes: 0 %
Lymphocytes Relative: 27 %
Lymphs Abs: 2.1 10*3/uL (ref 0.7–4.0)
MCH: 33.5 pg (ref 26.0–34.0)
MCHC: 34.1 g/dL (ref 30.0–36.0)
MCV: 98.4 fL (ref 80.0–100.0)
Monocytes Absolute: 0.8 10*3/uL (ref 0.1–1.0)
Monocytes Relative: 10 %
Neutro Abs: 4.7 10*3/uL (ref 1.7–7.7)
Neutrophils Relative %: 62 %
Platelets: 184 10*3/uL (ref 150–400)
RBC: 3.16 MIL/uL — ABNORMAL LOW (ref 3.87–5.11)
RDW: 12.5 % (ref 11.5–15.5)
WBC: 7.7 10*3/uL (ref 4.0–10.5)
nRBC: 0 % (ref 0.0–0.2)

## 2021-02-02 LAB — PHOSPHORUS: Phosphorus: 3.1 mg/dL (ref 2.5–4.6)

## 2021-02-02 LAB — ECHOCARDIOGRAM COMPLETE
Area-P 1/2: 2.95 cm2
S' Lateral: 2.7 cm

## 2021-02-02 LAB — MAGNESIUM: Magnesium: 1.8 mg/dL (ref 1.7–2.4)

## 2021-02-02 NOTE — Progress Notes (Signed)
  Echocardiogram 2D Echocardiogram has been performed.  Christina Sellers 02/02/2021, 10:32 AM

## 2021-02-02 NOTE — Progress Notes (Signed)
[facility] PROGRESS NOTE    Progress Note  Christina Sellers  YBW:[id number] DOB: 1941/11/08 DOA: 02/01/2021 PCP: Josetta Huddle, MD     Brief Narrative:   Christina Sellers is an 80 y.o. female past medical history of hypothyroidism, essential hypertension irritable bowel syndrome, peripheral vascular disease gout and insulin-dependent diabetes mellitus type 2, chronic diastolic heart failure comes in to [facility] emergency department after experiencing seen an episode of syncope for which she was evaluated in the ED which showed a CT scan of the head with no acute findings, she was hydrated past oral challenge test, UA showed possible UTI and was discharged on Keflex, she continued to have watery nonbloody stool several times a day, which she did not develop intensive lightheadedness and continue to have diarrhea and had an episode of loss of consciousness, in the ED imaging was inconclusive UA was unremarkable she was found to be hyponatremic with metabolic acidosis and multiple electrolyte abnormalities  Significant Events: 01/27/2021 came into the ED with a syncopal episode was found to have a possible UTI and dehydrated she was given an oral challenge test which she passed and was discharged on Keflex 02/01/2021 came back to the ED and admitted for syncope  Significant studies: 02/01/2021 MRI of the brain no acute findings, mild cerebral atrophy, and moderate chronic microvascular changes. 02/01/2021 CT of the head showed no acute findings  Antibiotics: None  Microbiology data: Blood culture:  Procedures: None  Assessment/Plan:   Syncope possibly due to orthostatic hypotension: Orthostatics are pending.  When she came to the ED back in 22nd her creatinine was elevated. On admission to the ED on this admission her sodium was 126 and mild hyperkalemia, with non-anion gap metabolic acidosis she was started IV fluids and electrolytes repleted her sodium is improving creatinine  has returned to baseline.   Orthostatics have not been checked. Echocardiogram pending Cardiac biomarkers of been negative Agree with holding diuretic therapy. Physical therapy evaluation is pending.  E. coli UTI: Urine culture on 01/27/2021 in the emergency room showed E. coli she was started on Keflex and completed 4-day course.  Nausea vomiting and acute diarrhea: In the setting of UTI and recent, for the last 6 days. C. difficile was sent. On clear liquid. Abdominal exam is tender on admission CT scan of the abdomen and pelvis is pending.  Non-anion gap metabolic acidosis: Improving with IV fluid hydration.  Likely due to diarrhea.  Hypovolemic hyponatremia: Resolving with IV fluid.  Diabetes mellitus type 2 with hyperglycemia: Holding Metformin, hemoglobin A1c of 5.8.  Hypothyroidism: Continue Synthroid.  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  Chronic diastolic heart failure: Noted, hold diuretic therapy.  Essential hypertension, benign Hold all antihypertensive medication.   DVT prophylaxis: lovenox Family Communication:son Status is: Observation  The patient remains OBS appropriate and will d/c before 2 midnights.  Dispo: The patient is from: Home              Anticipated d/c is to: Home              Patient currently is not medically stable to d/c.   Difficult to place patient No Code Status:     Code Status Orders  (From admission, onward)         Start     Ordered   02/01/21 2016  Full code  Continuous        02/01/21 2016        Code Status History  This patient has a current code status but no historical code status.   Advance Care Planning Activity        IV Access:    Peripheral IV   Procedures and diagnostic studies:   DG Lumbar Spine Complete  Result Date: 02/01/2021 CLINICAL DATA:  Syncopal episode 5 days ago EXAM: LUMBAR SPINE - COMPLETE 4+ VIEW COMPARISON:  August 08, 2020  FINDINGS: There are five non-rib bearing lumbar-type vertebral bodies. Levocurvature of the lumbar spine and dextrocurvature of the thoracolumbar junction, unchanged. Unchanged grade 1 anterolisthesis of L4-5. Unchanged mild wedging of T12. There is no evidence for acute fracture or subluxation. Severe intervertebral disc space height loss of T11-12, L3-4 and L2-3. Moderate intervertebral disc space height loss of L4-5 and L5-S1. Lower lumbar facet arthropathy. Atherosclerotic calcifications of the aorta. Surgical clips project over the RIGHT upper quadrant. IMPRESSION: 1. No acute osseous abnormality. 2. Unchanged multilevel degenerative disc disease, severe at T11-12, L3-4 and L2-3. 3. Unchanged grade 1 anterolisthesis of L4-5. Electronically Signed   By: Valentino Saxon MD   On: 02/01/2021 17:12   CT HEAD WO CONTRAST  Result Date: 02/01/2021 CLINICAL DATA:  Syncopal episode 5 days ago, left eye bruising, persistent nausea vomiting. EXAM: CT HEAD WITHOUT CONTRAST TECHNIQUE: Contiguous axial images were obtained from the base of the skull through the vertex without intravenous contrast. COMPARISON:  Head CT January 27, 2021 FINDINGS: Brain: No evidence of acute large vascular territory infarction, hemorrhage, hydrocephalus, extra-axial collection or mass lesion/mass effect. Mild parenchymal volume loss with ex vacuo dilatation of ventricular system. Similar periventricular white matter hypoattenuation, which likely represents chronic small vessel ischemic disease. Vascular: No hyperdense vessel. Vascular calcifications at the skull base. Skull: No acute osseous abnormality. Sinuses/Orbits: No acute finding. Other: Decreased left face and periorbital soft tissue swelling IMPRESSION: 1. No acute intracranial findings. 2. Decreased left face and periorbital soft tissue swelling. 3. Similar mild parenchymal volume loss and chronic small vessel ischemic disease. Electronically Signed   By: Dahlia Bailiff MD    On: 02/01/2021 14:16   MR BRAIN WO CONTRAST  Result Date: 02/01/2021 CLINICAL DATA:  Neuro deficit, acute, stroke suspected EXAM: MRI HEAD WITHOUT CONTRAST TECHNIQUE: Multiplanar, multiecho pulse sequences of the brain and surrounding structures were obtained without intravenous contrast. COMPARISON:  02/01/2021 and prior. FINDINGS: Brain: No diffusion-weighted signal abnormality. No intracranial hemorrhage. No midline shift, ventriculomegaly or extra-axial fluid collection. No mass lesion. Mild cerebral atrophy with ex vacuo dilatation. Moderate chronic microvascular ischemic changes. Chronic right cerebellar lacunar insult. Vascular: Proximally preserved major intracranial flow voids. Skull and upper cervical spine: Normal marrow signal. Sinuses/Orbits: Sequela of bilateral lens replacement. Globes are intact. Decreased left periorbital soft tissue swelling. Pneumatized paranasal sinuses. Small right mastoid effusion. Other: None. IMPRESSION: No acute intracranial process. Chronic right cerebellar lacunar insult. Mild cerebral atrophy. Moderate chronic microvascular ischemic changes. Electronically Signed   By: Primitivo Gauze M.D.   On: 02/01/2021 19:08   DG Hips Bilat W or Wo Pelvis 2 Views  Result Date: 02/01/2021 CLINICAL DATA:  Syncope EXAM: DG HIP (WITH OR WITHOUT PELVIS) 2V BILAT COMPARISON:  Apr 18, 2019 FINDINGS: Osteopenia. No acute fracture or dislocation. Degenerative changes of the lower lumbar spine. Hip joints are relatively preserved. No area of erosion or osseous destruction. No unexpected radiopaque foreign body. Vascular calcifications. IMPRESSION: No acute osseous abnormality. If persistent concern for nondisplaced hip or pelvic fracture, recommend dedicated pelvic MRI. Electronically Signed   By: Valentino Saxon MD  On: 02/01/2021 17:13     Medical Consultants:    None.   Subjective:    Christina Sellers she relates she still lightheaded upon standing  Objective:     Vitals:   02/01/21 2030 02/01/21 2101 02/01/21 2140 02/02/21 0443  BP: (!) 122/47  127/79 (!) 110/49  Pulse: 72  82 67  Resp: 13  16 14   Temp:  98.4 F (36.9 C)  98.2 F (36.8 C)  TempSrc:  Oral  Oral  SpO2: 96%  99% 96%   SpO2: 96 %   Intake/Output Summary (Last 24 hours) at 02/02/2021 3614 Last data filed at 02/02/2021 0500 Gross per 24 hour  Intake -  Output 700 ml  Net -700 ml   There were no vitals filed for this visit.  Exam: General exam: In no acute distress. Respiratory system: Good air movement and clear to auscultation. Cardiovascular system: S1 & S2 heard, RRR. No JVD. Gastrointestinal system: Abdomen is nondistended, soft and nontender.  Extremities: No pedal edema. Skin: No rashes, lesions or ulcers Psychiatry: Judgement and insight appear normal. Mood & affect appropriate.    Data Reviewed:    Labs: Basic Metabolic Panel: Recent Labs  Lab 01/27/21 1355 02/01/21 1309 02/01/21 2200 02/02/21 0151  NA 131* 128* 126* 131*  K 3.9 3.9 3.4* 3.5  CL 101 98 96* 103  CO2 18* 16* 18* 21*  GLUCOSE 156* 168* 112* 93  BUN 28* 34* 25* 23  CREATININE 1.02* 1.08* 0.95 0.93  CALCIUM 8.9 9.6 8.8* 8.6*  MG  --   --   --  1.8  PHOS  --   --   --  3.1   GFR Estimated Creatinine Clearance: 36.7 mL/min (by C-G formula based on SCr of 0.93 mg/dL). Liver Function Tests: Recent Labs  Lab 01/27/21 1355 02/01/21 1309 02/02/21 0151  AST 24 27 18   ALT 15 14 12   ALKPHOS 46 46 41  BILITOT 1.0 1.1 1.2  PROT 6.5 6.9 5.6*  ALBUMIN 4.1 4.2 3.3*   Recent Labs  Lab 01/27/21 1355 02/01/21 1309  LIPASE 32 30   No results for input(s): AMMONIA in the last 168 hours. Coagulation profile Recent Labs  Lab 02/01/21 1335  INR 1.0   COVID-19 Labs  No results for input(s): DDIMER, FERRITIN, LDH, CRP in the last 72 hours.  Lab Results  Component Value Date   [place] NEGATIVE 02/01/2021    CBC: Recent Labs  Lab 01/27/21 1355 02/01/21 1309  02/01/21 1335 02/02/21 0151  WBC 8.5 TEST CANCELLED PER RN 8.6 7.7  NEUTROABS  --   --  5.4 4.7  HGB 11.6* TEST CANCELLED PER RN 12.5 10.6*  HCT 35.7* TEST CANCELLED PER RN 37.3 31.1*  MCV 102.9* TEST CANCELLED PER RN 100.5* 98.4  PLT 169 TEST CANCELLED PER RN 211 184   Cardiac Enzymes: No results for input(s): CKTOTAL, CKMB, CKMBINDEX, TROPONINI in the last 168 hours. BNP (last 3 results) Recent Labs    01/21/21 1223  PROBNP 551   CBG: No results for input(s): GLUCAP in the last 168 hours. D-Dimer: No results for input(s): DDIMER in the last 72 hours. Hgb A1c: Recent Labs    02/01/21 2200  HGBA1C 5.8*   Lipid Profile: No results for input(s): CHOL, HDL, LDLCALC, TRIG, CHOLHDL, LDLDIRECT in the last 72 hours. Thyroid function studies: No results for input(s): TSH, T4TOTAL, T3FREE, THYROIDAB in the last 72 hours.  Invalid input(s): FREET3 Anemia work up: No results for input(s): VITAMINB12, FOLATE,  FERRITIN, TIBC, IRON, RETICCTPCT in the last 72 hours. Sepsis Labs: Recent Labs  Lab 01/27/21 1355 02/01/21 1309 02/01/21 1335 02/01/21 2200 02/02/21 0151  WBC 8.5 TEST CANCELLED PER RN 8.6  --  7.7  LATICACIDVEN  --   --   --  0.6  --    Microbiology Recent Results (from the past 240 hour(s))  Urine culture     Status: Abnormal   Collection Time: 01/27/21  1:55 PM   Specimen: Urine, Random  Result Value Ref Range Status   Specimen Description URINE, RANDOM  Final   Special Requests   Final    ADDED 2227 Performed at [facility], [place] [address], [place], [place] [address]    Culture >=100,000 COLONIES/mL ESCHERICHIA COLI (A)  Final   Report Status 01/30/2021 FINAL  Final   Organism ID, Bacteria ESCHERICHIA COLI (A)  Final      Susceptibility   Escherichia coli - MIC*    AMPICILLIN 8 SENSITIVE Sensitive     CEFAZOLIN <=4 SENSITIVE Sensitive     CEFEPIME <=0.12 SENSITIVE Sensitive     CEFTRIAXONE <=0.25 SENSITIVE Sensitive     CIPROFLOXACIN <=0.25  SENSITIVE Sensitive     GENTAMICIN <=1 SENSITIVE Sensitive     IMIPENEM <=0.25 SENSITIVE Sensitive     NITROFURANTOIN <=16 SENSITIVE Sensitive     TRIMETH/SULFA <=20 SENSITIVE Sensitive     AMPICILLIN/SULBACTAM 4 SENSITIVE Sensitive     PIP/TAZO <=4 SENSITIVE Sensitive     * >=100,000 COLONIES/mL ESCHERICHIA COLI  Resp Panel by RT-PCR (Flu A&B, Covid) Nasopharyngeal Swab     Status: None   Collection Time: 02/01/21  1:35 PM   Specimen: Nasopharyngeal Swab; Nasopharyngeal(NP) swabs in vial transport medium  Result Value Ref Range Status   SARS Coronavirus 2 by RT PCR NEGATIVE NEGATIVE Final    Comment: (NOTE) SARS-CoV-2 target nucleic acids are NOT DETECTED.  The SARS-CoV-2 RNA is generally detectable in upper respiratory specimens during the acute phase of infection. The lowest concentration of SARS-CoV-2 viral copies this assay can detect is 138 copies/mL. A negative result does not preclude SARS-Cov-2 infection and should not be used as the sole basis for treatment or other patient management decisions. A negative result may occur with  improper specimen collection/handling, submission of specimen other than nasopharyngeal swab, presence of viral mutation(s) within the areas targeted by this assay, and inadequate number of viral copies(<138 copies/mL). A negative result must be combined with clinical observations, patient history, and epidemiological information. The expected result is Negative.  Fact Sheet for Patients:  EntrepreneurPulse.com.au  Fact Sheet for Healthcare Providers:  IncredibleEmployment.be  This test is no t yet approved or cleared by the [place] FDA and  has been authorized for detection and/or diagnosis of SARS-CoV-2 by FDA under an Emergency Use Authorization (EUA). This EUA will remain  in effect (meaning this test can be used) for the duration of the COVID-19 declaration under Section 564(b)(1) of the Act,  21 U.S.C.section 360bbb-3(b)(1), unless the authorization is terminated  or revoked sooner.       Influenza A by PCR NEGATIVE NEGATIVE Final   Influenza B by PCR NEGATIVE NEGATIVE Final    Comment: (NOTE) The Xpert Xpress SARS-CoV-2/FLU/RSV plus assay is intended as an aid in the diagnosis of influenza from Nasopharyngeal swab specimens and should not be used as a sole basis for treatment. Nasal washings and aspirates are unacceptable for Xpert Xpress SARS-CoV-2/FLU/RSV testing.  Fact Sheet for Patients: EntrepreneurPulse.com.au  Fact Sheet  for Healthcare Providers: IncredibleEmployment.be  This test is not yet approved or cleared by the [place] and has been authorized for detection and/or diagnosis of SARS-CoV-2 by FDA under an Emergency Use Authorization (EUA). This EUA will remain in effect (meaning this test can be used) for the duration of the COVID-19 declaration under Section 564(b)(1) of the Act, 21 U.S.C. section 360bbb-3(b)(1), unless the authorization is terminated or revoked.  Performed at [facility], [place] [address], [place], [place] [address]      Medications:   . enoxaparin (LOVENOX) injection  40 mg Subcutaneous Q24H  . fluticasone  1 spray Each Nare Daily  . insulin aspart  0-15 Units Subcutaneous TID AC & HS  . levothyroxine  100 mcg Oral Once per day on Mon Wed Fri  . [START ON 02/03/2021] levothyroxine  50 mcg Oral Once per day on Sun Tue Thu Sat  . metoprolol succinate  25 mg Oral Daily  . rosuvastatin  5 mg Oral Daily  . sodium chloride flush  3 mL Intravenous Q12H   Continuous Infusions: . sodium chloride 100 mL/hr (02/01/21 2215)      LOS: 0 days   Charlynne Cousins  [facility]  02/02/2021, 7:29 AM

## 2021-02-02 NOTE — Evaluation (Signed)
Physical Therapy Evaluation Patient Details Name: Christina Sellers MRN: [id number] DOB: 09/15/1941 Today's Date: 02/02/2021   History of Present Illness  Pt adm with syncope. PMH - DM, chf, gout, pvd, htn, irritable bowel syndrome, arthritis  Clinical Impression  Pt admitted with above diagnosis and presents to PT with functional limitations due to deficits listed below (See PT problem list). Pt needs skilled PT to maximize independence and safety to allow discharge to home. Pt able to amb in hallway without any dizziness or lightheadedness. Pt eager for THR surgery on March 11.      Follow Up Recommendations No PT follow up;Supervision - Intermittent    Equipment Recommendations  None recommended by PT    Recommendations for Other Services       Precautions / Restrictions Precautions Precautions: Fall      Mobility  Bed Mobility Overal bed mobility: Needs Assistance Bed Mobility: Supine to Sit     Supine to sit: Supervision;HOB elevated     General bed mobility comments: Incr time and effort    Transfers Overall transfer level: Needs assistance Equipment used: Rolling walker (2 wheeled) Transfers: Sit to/from Stand Sit to Stand: Supervision         General transfer comment: Assist for safety and lines  Ambulation/Gait Ambulation/Gait assistance: Supervision Gait Distance (Feet): 350 Feet Assistive device: Rolling walker (2 wheeled) Gait Pattern/deviations: Step-through pattern;Decreased step length - right;Decreased step length - left;Shuffle Gait velocity: decr Gait velocity interpretation: <1.31 ft/sec, indicative of household ambulator General Gait Details: Assist for safety and Engineer, technical sales    Modified Rankin (Stroke Patients Only)       Balance Overall balance assessment: Needs assistance Sitting-balance support: No upper extremity supported;Feet supported Sitting balance-Leahy Scale: Good     Standing  balance support: No upper extremity supported Standing balance-Leahy Scale: Fair                               Pertinent Vitals/Pain Pain Assessment: Faces Faces Pain Scale: Hurts little more Pain Location: back and rt hip Pain Descriptors / Indicators: Grimacing Pain Intervention(s): Limited activity within patient's tolerance    Home Living Family/patient expects to be discharged to:: Private residence Living Arrangements: Alone Available Help at Discharge: Family;Available PRN/intermittently (currently daughter in town and staying) Type of Home: House Home Access: Stairs to enter Entrance Stairs-Rails: Right Entrance Stairs-Number of Steps: 2 Home Layout: One level Home Equipment: Environmental consultant - 2 wheels;Grab bars - tub/shower      Prior Function Level of Independence: Independent with assistive device(s)         Comments: Uses rolling walker due to arthritic rt hip     Hand Dominance        Extremity/Trunk Assessment   Upper Extremity Assessment Upper Extremity Assessment: Overall WFL for tasks assessed    Lower Extremity Assessment Lower Extremity Assessment: Generalized weakness       Communication   Communication: HOH  Cognition Arousal/Alertness: Awake/alert Behavior During Therapy: WFL for tasks assessed/performed Overall Cognitive Status: Within Functional Limits for tasks assessed                                        General Comments General comments (skin integrity, edema, etc.): VSS. No dizziness or lightheadedness  Exercises     Assessment/Plan    PT Assessment Patient needs continued PT services  PT Problem List Decreased strength;Decreased activity tolerance;Decreased balance;Decreased mobility       PT Treatment Interventions DME instruction;Gait training;Stair training;Therapeutic activities;Functional mobility training;Therapeutic exercise;Balance training;Patient/family education    PT Goals (Current  goals can be found in the Care Plan section)  Acute Rehab PT Goals Patient Stated Goal: return home and have THR on March 11. PT Goal Formulation: With patient/family Time For Goal Achievement: 02/09/21 Potential to Achieve Goals: Good    Frequency Min 3X/week   Barriers to discharge        Co-evaluation               AM-PAC PT "6 Clicks" Mobility  Outcome Measure Help needed turning from your back to your side while in a flat bed without using bedrails?: None Help needed moving from lying on your back to sitting on the side of a flat bed without using bedrails?: None Help needed moving to and from a bed to a chair (including a wheelchair)?: A Little Help needed standing up from a chair using your arms (e.g., wheelchair or bedside chair)?: A Little Help needed to walk in hospital room?: A Little Help needed climbing 3-5 steps with a railing? : A Little 6 Click Score: 20    End of Session Equipment Utilized During Treatment: Gait belt Activity Tolerance: Patient tolerated treatment well Patient left: in chair;with call bell/phone within reach;with chair alarm set;with family/visitor present Nurse Communication: Mobility status PT Visit Diagnosis: Other abnormalities of gait and mobility (R26.89)    Time: 1155-1230 PT Time Calculation (min) (ACUTE ONLY): 35 min   Charges:   PT Evaluation $PT Eval Moderate Complexity: 1 Mod PT Treatments $Gait Training: 8-22 mins        [place] Pager [phone] Office [place] 02/02/2021, 2:11 PM

## 2021-02-03 DIAGNOSIS — R55 Syncope and collapse: Secondary | ICD-10-CM | POA: Diagnosis not present

## 2021-02-03 DIAGNOSIS — E872 Acidosis: Secondary | ICD-10-CM | POA: Diagnosis not present

## 2021-02-03 DIAGNOSIS — M549 Dorsalgia, unspecified: Secondary | ICD-10-CM | POA: Diagnosis not present

## 2021-02-03 DIAGNOSIS — I10 Essential (primary) hypertension: Secondary | ICD-10-CM | POA: Diagnosis not present

## 2021-02-03 DIAGNOSIS — N3000 Acute cystitis without hematuria: Secondary | ICD-10-CM | POA: Diagnosis not present

## 2021-02-03 DIAGNOSIS — G8929 Other chronic pain: Secondary | ICD-10-CM | POA: Diagnosis not present

## 2021-02-03 DIAGNOSIS — N390 Urinary tract infection, site not specified: Secondary | ICD-10-CM | POA: Diagnosis not present

## 2021-02-03 DIAGNOSIS — E1165 Type 2 diabetes mellitus with hyperglycemia: Secondary | ICD-10-CM | POA: Diagnosis not present

## 2021-02-03 DIAGNOSIS — E871 Hypo-osmolality and hyponatremia: Secondary | ICD-10-CM | POA: Diagnosis not present

## 2021-02-03 LAB — BASIC METABOLIC PANEL
Anion gap: 8 (ref 5–15)
BUN: 13 mg/dL (ref 8–23)
CO2: 21 mmol/L — ABNORMAL LOW (ref 22–32)
Calcium: 8.8 mg/dL — ABNORMAL LOW (ref 8.9–10.3)
Chloride: 108 mmol/L (ref 98–111)
Creatinine, Ser: 0.85 mg/dL (ref 0.44–1.00)
GFR, Estimated: >60 mL/min (ref >60)
Glucose, Bld: 93 mg/dL (ref 70–99)
Potassium: 3.3 mmol/L — ABNORMAL LOW (ref 3.5–5.1)
Sodium: 137 mmol/L (ref 135–145)

## 2021-02-03 LAB — GLUCOSE, CAPILLARY
Glucose-Capillary: 81 mg/dL (ref 70–99)
Glucose-Capillary: 112 mg/dL — ABNORMAL HIGH (ref 70–99)
Glucose-Capillary: 115 mg/dL — ABNORMAL HIGH (ref 70–99)

## 2021-02-03 LAB — UREA NITROGEN, URINE: Urea Nitrogen, Ur: 592 mg/dL

## 2021-02-03 LAB — MAGNESIUM: Magnesium: 1.9 mg/dL (ref 1.7–2.4)

## 2021-02-03 MED ORDER — CYCLOSPORINE 0.05 % OP EMUL
1.0000 [drp] | Freq: Two times a day (BID) | OPHTHALMIC | Status: DC
  Administered 2021-02-03 – 2021-02-05 (×4): 1 [drp] via OPHTHALMIC
  Filled 2021-02-03 (×6): qty 1

## 2021-02-03 MED ORDER — POTASSIUM CHLORIDE CRYS ER 20 MEQ PO TBCR
40.0000 meq | EXTENDED_RELEASE_TABLET | Freq: Two times a day (BID) | ORAL | Status: AC
  Administered 2021-02-03 (×2): 40 meq via ORAL
  Filled 2021-02-03 (×2): qty 2

## 2021-02-03 MED ORDER — ARTIFICIAL TEARS OPHTHALMIC OINT
1.0000 "application " | TOPICAL_OINTMENT | Freq: Every evening | OPHTHALMIC | Status: DC | PRN
  Filled 2021-02-03: qty 3.5

## 2021-02-03 MED ORDER — HYPROMELLOSE 0.3 % OP GEL: 1.0000 "application " | Freq: Every day | OPHTHALMIC | Status: DC

## 2021-02-03 MED ORDER — MAGNESIUM SULFATE 2 GM/50ML IV SOLN
2.0000 g | Freq: Once | INTRAVENOUS | Status: AC
  Administered 2021-02-03: 2 g via INTRAVENOUS
  Filled 2021-02-03: qty 50

## 2021-02-03 MED ORDER — TRIAMTERENE-HCTZ 37.5-25 MG PO TABS
1.0000 | ORAL_TABLET | Freq: Every day | ORAL | Status: DC | PRN
  Administered 2021-02-04 – 2021-02-05 (×2): 1 via ORAL
  Filled 2021-02-03 (×4): qty 1

## 2021-02-03 MED ORDER — ARTIFICIAL TEARS OPHTHALMIC OINT
1.0000 "application " | TOPICAL_OINTMENT | Freq: Every day | OPHTHALMIC | Status: DC
  Administered 2021-02-03 – 2021-02-04 (×2): 1 via OPHTHALMIC
  Filled 2021-02-03 (×2): qty 3.5

## 2021-02-03 NOTE — Progress Notes (Addendum)
[facility] PROGRESS NOTE    Progress Note  Christina Sellers  LKT:[id number] DOB: May 07, 1941 DOA: 02/01/2021 PCP: Josetta Huddle, MD     Brief Narrative:   Christina Sellers is an 80 y.o. female past medical history of hypothyroidism, essential hypertension irritable bowel syndrome, peripheral vascular disease gout and insulin-dependent diabetes mellitus type 2, chronic diastolic heart failure comes in to [facility] emergency department after experiencing seen an episode of syncope for which she was evaluated in the ED which showed a CT scan of the head with no acute findings, she was hydrated past oral challenge test, UA showed possible UTI and was discharged on Keflex, she continued to have watery nonbloody stool several times a day, which she did not develop intensive lightheadedness and continue to have diarrhea and had an episode of loss of consciousness, in the ED imaging was inconclusive UA was unremarkable she was found to be hyponatremic with metabolic acidosis and multiple electrolyte abnormalities  Significant Events: 01/27/2021 came into the ED with a syncopal episode was found to have a possible UTI and dehydrated she was given an oral challenge test which she passed and was discharged on Keflex 02/01/2021 came back to the ED and admitted for syncope  Significant studies: 02/01/2021 MRI of the brain no acute findings, mild cerebral atrophy, and moderate chronic microvascular changes. 02/01/2021 CT of the head showed no acute findings  Antibiotics: None  Microbiology data: Blood culture:  Procedures: 02/02/2019: 2D echocardiogram that showed an EF of 65% with grade 1 diastolic heart failure.  Assessment/Plan:   Syncope possibly due to orthostatic hypotension: Orthostatic vitals not checked on admission.  She relates her dizziness upon standing is resolved this morning. Her orthostatics were checked yesterday after IV fluid hydration and they have resolved. She was  aggressively fluid resuscitated her antihypertensive medications were held. A 2D echocardiogram showed an EF of 65% with grade 1 diastolic heart failure. Cardiac biomarkers remain negative till date. Physical therapy evaluated the patient recommended home health PT. Basic metabolic panel is pending this morning. No events on telemetry she is nonfocal on physical exam.  E. coli UTI: Urine culture on 01/27/2021 in the emergency room showed E. coli she was started on Keflex and completed 4-day course.  Nausea vomiting and acute diarrhea: In the setting of UTI and recent, for the last 6 days.  Non-anion gap metabolic acidosis: Improving with IV fluid hydration.  Likely due to diarrhea.  Hypovolemic hyponatremia: Improving with IV fluid hydration basic metabolic panel is pending this morning.  New Hypokalemia: repleted orally recheck in am. Check Mag level.  Diabetes mellitus type 2 with hyperglycemia: Holding Metformin, hemoglobin A1c of 5.8.  Hypothyroidism: Continue Synthroid.  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  Chronic diastolic heart failure: Noted, hold diuretic therapy.  Essential hypertension, benign Blood pressure is trending up this morning was systolic greater than 342. We will go ahead and start some of her antihypertensive medication.   DVT prophylaxis: lovenox Family Communication:son Status is: Observation  The patient remains OBS appropriate and will d/c before 2 midnights.  Dispo: The patient is from: Home              Anticipated d/c is to: Home              Patient currently is not medically stable to d/c.   Difficult to place patient No Code Status:     Code Status Orders  (From admission, onward)  Start     Ordered   02/01/21 2016  Full code  Continuous        02/01/21 2016        Code Status History    This patient has a current code status but no historical code status.   Advance  Care Planning Activity        IV Access:    Peripheral IV   Procedures and diagnostic studies:   DG Lumbar Spine Complete  Result Date: 02/01/2021 CLINICAL DATA:  Syncopal episode 5 days ago EXAM: LUMBAR SPINE - COMPLETE 4+ VIEW COMPARISON:  August 08, 2020 FINDINGS: There are five non-rib bearing lumbar-type vertebral bodies. Levocurvature of the lumbar spine and dextrocurvature of the thoracolumbar junction, unchanged. Unchanged grade 1 anterolisthesis of L4-5. Unchanged mild wedging of T12. There is no evidence for acute fracture or subluxation. Severe intervertebral disc space height loss of T11-12, L3-4 and L2-3. Moderate intervertebral disc space height loss of L4-5 and L5-S1. Lower lumbar facet arthropathy. Atherosclerotic calcifications of the aorta. Surgical clips project over the RIGHT upper quadrant. IMPRESSION: 1. No acute osseous abnormality. 2. Unchanged multilevel degenerative disc disease, severe at T11-12, L3-4 and L2-3. 3. Unchanged grade 1 anterolisthesis of L4-5. Electronically Signed   By: Valentino Saxon MD   On: 02/01/2021 17:12   CT HEAD WO CONTRAST  Result Date: 02/01/2021 CLINICAL DATA:  Syncopal episode 5 days ago, left eye bruising, persistent nausea vomiting. EXAM: CT HEAD WITHOUT CONTRAST TECHNIQUE: Contiguous axial images were obtained from the base of the skull through the vertex without intravenous contrast. COMPARISON:  Head CT January 27, 2021 FINDINGS: Brain: No evidence of acute large vascular territory infarction, hemorrhage, hydrocephalus, extra-axial collection or mass lesion/mass effect. Mild parenchymal volume loss with ex vacuo dilatation of ventricular system. Similar periventricular white matter hypoattenuation, which likely represents chronic small vessel ischemic disease. Vascular: No hyperdense vessel. Vascular calcifications at the skull base. Skull: No acute osseous abnormality. Sinuses/Orbits: No acute finding. Other: Decreased left face  and periorbital soft tissue swelling IMPRESSION: 1. No acute intracranial findings. 2. Decreased left face and periorbital soft tissue swelling. 3. Similar mild parenchymal volume loss and chronic small vessel ischemic disease. Electronically Signed   By: Dahlia Bailiff MD   On: 02/01/2021 14:16   MR BRAIN WO CONTRAST  Result Date: 02/01/2021 CLINICAL DATA:  Neuro deficit, acute, stroke suspected EXAM: MRI HEAD WITHOUT CONTRAST TECHNIQUE: Multiplanar, multiecho pulse sequences of the brain and surrounding structures were obtained without intravenous contrast. COMPARISON:  02/01/2021 and prior. FINDINGS: Brain: No diffusion-weighted signal abnormality. No intracranial hemorrhage. No midline shift, ventriculomegaly or extra-axial fluid collection. No mass lesion. Mild cerebral atrophy with ex vacuo dilatation. Moderate chronic microvascular ischemic changes. Chronic right cerebellar lacunar insult. Vascular: Proximally preserved major intracranial flow voids. Skull and upper cervical spine: Normal marrow signal. Sinuses/Orbits: Sequela of bilateral lens replacement. Globes are intact. Decreased left periorbital soft tissue swelling. Pneumatized paranasal sinuses. Small right mastoid effusion. Other: None. IMPRESSION: No acute intracranial process. Chronic right cerebellar lacunar insult. Mild cerebral atrophy. Moderate chronic microvascular ischemic changes. Electronically Signed   By: Primitivo Gauze M.D.   On: 02/01/2021 19:08   ECHOCARDIOGRAM COMPLETE  Result Date: 02/02/2021    ECHOCARDIOGRAM REPORT   Patient Name:   SERENA PETTERSON Date of Exam: 02/02/2021 Medical Rec #:  [id number]         Height:       58.0 in Accession #:    [id number]  Weight:       135.0 lb Date of Birth:  26-Nov-1941         BSA:          1.541 m Patient Age:    51 years          BP:           147/53 mmHg Patient Gender: F                 HR:           71 bpm. Exam Location:  Inpatient Procedure: 2D Echo, Cardiac Doppler  and Color Doppler Indications:    R55 Syncope  History:        Patient has prior history of Echocardiogram examinations, most                 recent 06/23/2017. COPD and Carotid Disease; Risk                 Factors:Hypertension and Dyslipidemia. GERD. Thyroid Disease.  Sonographer:    Jonelle Sidle Dance Referring Phys: [id number] Zalma  1. Left ventricular ejection fraction, by estimation, is 65 to 70%. The left ventricle has normal function. The left ventricle has no regional wall motion abnormalities. Left ventricular diastolic parameters are consistent with Grade I diastolic dysfunction (impaired relaxation).  2. Right ventricular systolic function is normal. The right ventricular size is mildly enlarged. There is normal pulmonary artery systolic pressure.  3. Left atrial size was mild to moderately dilated.  4. The mitral valve is normal in structure. Mild mitral valve regurgitation. No evidence of mitral stenosis. Moderate mitral annular calcification.  5. Tricuspid valve regurgitation is mild to moderate.  6. The aortic valve is normal in structure. Aortic valve regurgitation is not visualized. No aortic stenosis is present.  7. The inferior vena cava is normal in size with greater than 50% respiratory variability, suggesting right atrial pressure of 3 mmHg. FINDINGS  Left Ventricle: Left ventricular ejection fraction, by estimation, is 65 to 70%. The left ventricle has normal function. The left ventricle has no regional wall motion abnormalities. The left ventricular internal cavity size was normal in size. There is  no left ventricular hypertrophy. Left ventricular diastolic parameters are consistent with Grade I diastolic dysfunction (impaired relaxation). Normal left ventricular filling pressure. Right Ventricle: The right ventricular size is mildly enlarged. No increase in right ventricular wall thickness. Right ventricular systolic function is normal. There is normal pulmonary artery  systolic pressure. The tricuspid regurgitant velocity is 2.46  m/s, and with an assumed right atrial pressure of 3 mmHg, the estimated right ventricular systolic pressure is 09.9 mmHg. Left Atrium: Left atrial size was mild to moderately dilated. Right Atrium: Right atrial size was normal in size. Pericardium: There is no evidence of pericardial effusion. Mitral Valve: The mitral valve is normal in structure. Moderate mitral annular calcification. Mild mitral valve regurgitation. No evidence of mitral valve stenosis. Tricuspid Valve: The tricuspid valve is normal in structure. Tricuspid valve regurgitation is mild to moderate. No evidence of tricuspid stenosis. Aortic Valve: The aortic valve is normal in structure. Aortic valve regurgitation is not visualized. No aortic stenosis is present. Pulmonic Valve: The pulmonic valve was normal in structure. Pulmonic valve regurgitation is not visualized. No evidence of pulmonic stenosis. Aorta: The aortic root is normal in size and structure. Venous: The inferior vena cava is normal in size with greater than 50% respiratory variability, suggesting right atrial pressure of 3 mmHg.  IAS/Shunts: No atrial level shunt detected by color flow Doppler.  LEFT VENTRICLE PLAX 2D LVIDd:         4.20 cm  Diastology LVIDs:         2.70 cm  LV e' medial:    6.85 cm/s LV PW:         1.10 cm  LV E/e' medial:  11.6 LV IVS:        0.90 cm  LV e' lateral:   6.31 cm/s LVOT diam:     2.00 cm  LV E/e' lateral: 12.6 LV SV:         82 LV SV Index:   53 LVOT Area:     3.14 cm  RIGHT VENTRICLE             IVC RV Basal diam:  2.40 cm     IVC diam: 1.60 cm RV S prime:     11.60 cm/s TAPSE (M-mode): 2.4 cm LEFT ATRIUM             Index       RIGHT ATRIUM           Index LA diam:        4.80 cm 3.11 cm/m  RA Area:     20.70 cm LA Vol (A2C):   65.9 ml 42.77 ml/m RA Volume:   64.80 ml  42.05 ml/m LA Vol (A4C):   56.6 ml 36.73 ml/m LA Biplane Vol: 63.0 ml 40.88 ml/m  AORTIC VALVE LVOT Vmax:   110.00  cm/s LVOT Vmean:  71.900 cm/s LVOT VTI:    0.262 m  AORTA Ao Root diam: 2.60 cm Ao Asc diam:  2.50 cm MITRAL VALVE                TRICUSPID VALVE MV Area (PHT): 2.95 cm     TR Peak grad:   24.2 mmHg MV Decel Time: 257 msec     TR Vmax:        246.00 cm/s MV E velocity: 79.30 cm/s MV A velocity: 120.00 cm/s  SHUNTS MV E/A ratio:  0.66         Systemic VTI:  0.26 m                             Systemic Diam: 2.00 cm Ena Dawley MD Electronically signed by Ena Dawley MD Signature Date/Time: 02/02/2021/10:56:45 AM    Final    DG Hips Bilat W or Wo Pelvis 2 Views  Result Date: 02/01/2021 CLINICAL DATA:  Syncope EXAM: DG HIP (WITH OR WITHOUT PELVIS) 2V BILAT COMPARISON:  Apr 18, 2019 FINDINGS: Osteopenia. No acute fracture or dislocation. Degenerative changes of the lower lumbar spine. Hip joints are relatively preserved. No area of erosion or osseous destruction. No unexpected radiopaque foreign body. Vascular calcifications. IMPRESSION: No acute osseous abnormality. If persistent concern for nondisplaced hip or pelvic fracture, recommend dedicated pelvic MRI. Electronically Signed   By: Valentino Saxon MD   On: 02/01/2021 17:13     Medical Consultants:    None.   Subjective:    Christina Sellers no new complaints.  Objective:    Vitals:   02/02/21 2125 02/02/21 2300 02/03/21 0040 02/03/21 0407  BP: (!) 171/57 (!) 176/59 (!) 176/59 (!) 171/58  Pulse:   93 72  Resp:      Temp: 98.7 F (37.1 C) 97.8 F (36.6 C) 97.8 F (36.6 C) 98.7 F (37.1  C)  TempSrc: Axillary Oral Tympanic Oral  SpO2:   95% 97%  Weight:    65.3 kg   SpO2: 97 %   Intake/Output Summary (Last 24 hours) at 02/03/2021 0740 Last data filed at 02/02/2021 2115 Gross per 24 hour  Intake 2298.33 ml  Output --  Net 2298.33 ml   Filed Weights   02/03/21 0407  Weight: 65.3 kg    Exam: General exam: In no acute distress, traumatic left side of her head with a bruise. Respiratory system: Good air movement  and clear to auscultation. Cardiovascular system: S1 & S2 heard, RRR. No JVD. Gastrointestinal system: Abdomen is nondistended, soft and nontender.  Extremities: No pedal edema. Skin: No rashes, lesions or ulcers    Data Reviewed:    Labs: Basic Metabolic Panel: Recent Labs  Lab 01/27/21 1355 02/01/21 1309 02/01/21 2200 02/02/21 0151  NA 131* 128* 126* 131*  K 3.9 3.9 3.4* 3.5  CL 101 98 96* 103  CO2 18* 16* 18* 21*  GLUCOSE 156* 168* 112* 93  BUN 28* 34* 25* 23  CREATININE 1.02* 1.08* 0.95 0.93  CALCIUM 8.9 9.6 8.8* 8.6*  MG  --   --   --  1.8  PHOS  --   --   --  3.1   GFR Estimated Creatinine Clearance: 38 mL/min (by C-G formula based on SCr of 0.93 mg/dL). Liver Function Tests: Recent Labs  Lab 01/27/21 1355 02/01/21 1309 02/02/21 0151  AST 24 27 18   ALT 15 14 12   ALKPHOS 46 46 41  BILITOT 1.0 1.1 1.2  PROT 6.5 6.9 5.6*  ALBUMIN 4.1 4.2 3.3*   Recent Labs  Lab 01/27/21 1355 02/01/21 1309  LIPASE 32 30   No results for input(s): AMMONIA in the last 168 hours. Coagulation profile Recent Labs  Lab 02/01/21 1335  INR 1.0   COVID-19 Labs  No results for input(s): DDIMER, FERRITIN, LDH, CRP in the last 72 hours.  Lab Results  Component Value Date   [place] NEGATIVE 02/01/2021    CBC: Recent Labs  Lab 01/27/21 1355 02/01/21 1309 02/01/21 1335 02/02/21 0151  WBC 8.5 TEST CANCELLED PER RN 8.6 7.7  NEUTROABS  --   --  5.4 4.7  HGB 11.6* TEST CANCELLED PER RN 12.5 10.6*  HCT 35.7* TEST CANCELLED PER RN 37.3 31.1*  MCV 102.9* TEST CANCELLED PER RN 100.5* 98.4  PLT 169 TEST CANCELLED PER RN 211 184   Cardiac Enzymes: No results for input(s): CKTOTAL, CKMB, CKMBINDEX, TROPONINI in the last 168 hours. BNP (last 3 results) Recent Labs    01/21/21 1223  PROBNP 551   CBG: Recent Labs  Lab 02/02/21 0744 02/02/21 1120 02/02/21 1620 02/02/21 2120  GLUCAP 88 137* 84 121*   D-Dimer: No results for input(s): DDIMER in the last 72  hours. Hgb A1c: Recent Labs    02/01/21 2200  HGBA1C 5.8*   Lipid Profile: No results for input(s): CHOL, HDL, LDLCALC, TRIG, CHOLHDL, LDLDIRECT in the last 72 hours. Thyroid function studies: No results for input(s): TSH, T4TOTAL, T3FREE, THYROIDAB in the last 72 hours.  Invalid input(s): FREET3 Anemia work up: No results for input(s): VITAMINB12, FOLATE, FERRITIN, TIBC, IRON, RETICCTPCT in the last 72 hours. Sepsis Labs: Recent Labs  Lab 01/27/21 1355 02/01/21 1309 02/01/21 1335 02/01/21 2200 02/02/21 0151  WBC 8.5 TEST CANCELLED PER RN 8.6  --  7.7  LATICACIDVEN  --   --   --  0.6  --    Microbiology  Recent Results (from the past 240 hour(s))  Urine culture     Status: Abnormal   Collection Time: 01/27/21  1:55 PM   Specimen: Urine, Random  Result Value Ref Range Status   Specimen Description URINE, RANDOM  Final   Special Requests   Final    ADDED 2227 Performed at [facility], [address] [place], [place] [address]    Culture >=100,000 COLONIES/mL ESCHERICHIA COLI (A)  Final   Report Status 01/30/2021 FINAL  Final   Organism ID, Bacteria ESCHERICHIA COLI (A)  Final      Susceptibility   Escherichia coli - MIC*    AMPICILLIN 8 SENSITIVE Sensitive     CEFAZOLIN <=4 SENSITIVE Sensitive     CEFEPIME <=0.12 SENSITIVE Sensitive     CEFTRIAXONE <=0.25 SENSITIVE Sensitive     CIPROFLOXACIN <=0.25 SENSITIVE Sensitive     GENTAMICIN <=1 SENSITIVE Sensitive     IMIPENEM <=0.25 SENSITIVE Sensitive     NITROFURANTOIN <=16 SENSITIVE Sensitive     TRIMETH/SULFA <=20 SENSITIVE Sensitive     AMPICILLIN/SULBACTAM 4 SENSITIVE Sensitive     PIP/TAZO <=4 SENSITIVE Sensitive     * >=100,000 COLONIES/mL ESCHERICHIA COLI  Resp Panel by RT-PCR (Flu A&B, Covid) Nasopharyngeal Swab     Status: None   Collection Time: 02/01/21  1:35 PM   Specimen: Nasopharyngeal Swab; Nasopharyngeal(NP) swabs in vial transport medium  Result Value Ref Range Status   SARS Coronavirus 2 by  RT PCR NEGATIVE NEGATIVE Final    Comment: (NOTE) SARS-CoV-2 target nucleic acids are NOT DETECTED.  The SARS-CoV-2 RNA is generally detectable in upper respiratory specimens during the acute phase of infection. The lowest concentration of SARS-CoV-2 viral copies this assay can detect is 138 copies/mL. A negative result does not preclude SARS-Cov-2 infection and should not be used as the sole basis for treatment or other patient management decisions. A negative result may occur with  improper specimen collection/handling, submission of specimen other than nasopharyngeal swab, presence of viral mutation(s) within the areas targeted by this assay, and inadequate number of viral copies(<138 copies/mL). A negative result must be combined with clinical observations, patient history, and epidemiological information. The expected result is Negative.  Fact Sheet for Patients:  EntrepreneurPulse.com.au  Fact Sheet for Healthcare Providers:  IncredibleEmployment.be  This test is no t yet approved or cleared by the [place] FDA and  has been authorized for detection and/or diagnosis of SARS-CoV-2 by FDA under an Emergency Use Authorization (EUA). This EUA will remain  in effect (meaning this test can be used) for the duration of the COVID-19 declaration under Section 564(b)(1) of the Act, 21 U.S.C.section 360bbb-3(b)(1), unless the authorization is terminated  or revoked sooner.       Influenza A by PCR NEGATIVE NEGATIVE Final   Influenza B by PCR NEGATIVE NEGATIVE Final    Comment: (NOTE) The Xpert Xpress SARS-CoV-2/FLU/RSV plus assay is intended as an aid in the diagnosis of influenza from Nasopharyngeal swab specimens and should not be used as a sole basis for treatment. Nasal washings and aspirates are unacceptable for Xpert Xpress SARS-CoV-2/FLU/RSV testing.  Fact Sheet for Patients: EntrepreneurPulse.com.au  Fact Sheet  for Healthcare Providers: IncredibleEmployment.be  This test is not yet approved or cleared by the [place] FDA and has been authorized for detection and/or diagnosis of SARS-CoV-2 by FDA under an Emergency Use Authorization (EUA). This EUA will remain in effect (meaning this test can be used) for the duration of the COVID-19 declaration  under Section 564(b)(1) of the Act, 21 U.S.C. section 360bbb-3(b)(1), unless the authorization is terminated or revoked.  Performed at [facility], [place] [address] [place], [place] [address]   Culture, blood (Routine X 2) w Reflex to ID Panel     Status: None (Preliminary result)   Collection Time: 02/01/21 10:00 PM   Specimen: BLOOD RIGHT HAND  Result Value Ref Range Status   Specimen Description BLOOD RIGHT HAND  Final   Special Requests   Final    BOTTLES DRAWN AEROBIC ONLY Blood Culture adequate volume   Culture   Final    NO GROWTH < 24 HOURS Performed at [facility], [place] [address] [place], [place] [address]    Report Status PENDING  Incomplete  Culture, blood (Routine X 2) w Reflex to ID Panel     Status: None (Preliminary result)   Collection Time: 02/01/21 10:03 PM   Specimen: BLOOD  Result Value Ref Range Status   Specimen Description BLOOD LEFT ANTECUBITAL  Final   Special Requests   Final    BOTTLES DRAWN AEROBIC AND ANAEROBIC Blood Culture results may not be optimal due to an inadequate volume of blood received in culture bottles   Culture   Final    NO GROWTH < 24 HOURS Performed at [facility], [place] [address] [place], [place] [address]    Report Status PENDING  Incomplete     Medications:   . enoxaparin (LOVENOX) injection  40 mg Subcutaneous Q24H  . fluticasone  1 spray Each Nare Daily  . insulin aspart  0-15 Units Subcutaneous TID AC & HS  . levothyroxine  100 mcg Oral Once per day on Mon Wed Fri  . levothyroxine  50 mcg Oral Once per day on Sun Tue Thu Sat  . metoprolol  succinate  25 mg Oral Daily  . rosuvastatin  5 mg Oral Daily  . sodium chloride flush  3 mL Intravenous Q12H   Continuous Infusions:     LOS: 0 days   Charlynne Cousins  [facility]  02/03/2021, 7:40 AM

## 2021-02-03 NOTE — Telephone Encounter (Signed)
Pt seeing Kerin Ransom, [place] 02/11/2021.  Clearance recommendations will be provided at that visit.  Phone note will be removed from preop pool. Richardson Dopp, PA-C    02/03/2021 9:03 AM

## 2021-02-03 NOTE — Progress Notes (Signed)
Physical Therapy Treatment Patient Details Name: Christina Sellers MRN: [id number] DOB: Jan 12, 1941 Today's Date: 02/03/2021    History of Present Illness Pt adm with syncope. PMH - DM, chf, gout, pvd, htn, irritable bowel syndrome, arthritis    PT Comments    Pt making good progress with mobility. Should be able to return home when medically ready.    Follow Up Recommendations  No PT follow up;Supervision - Intermittent     Equipment Recommendations  None recommended by PT    Recommendations for Other Services       Precautions / Restrictions Precautions Precautions: Fall    Mobility  Bed Mobility               General bed mobility comments: Pt sitting up in chair    Transfers Overall transfer level: Modified independent Equipment used: Rolling walker (2 wheeled) Transfers: Sit to/from Stand Sit to Stand: Modified independent (Device/Increase time)            Ambulation/Gait Ambulation/Gait assistance: Supervision Gait Distance (Feet): 450 Feet Assistive device: Rolling walker (2 wheeled) Gait Pattern/deviations: Step-through pattern;Shuffle;Decreased stride length (decr clearance of feet with scuffing/shuffling) Gait velocity: decr Gait velocity interpretation: <1.31 ft/sec, indicative of household ambulator General Gait Details: Assist for safety due to recent syncopal episodes.   Stairs             Wheelchair Mobility    Modified Rankin (Stroke Patients Only)       Balance Overall balance assessment: Needs assistance Sitting-balance support: No upper extremity supported;Feet supported Sitting balance-Leahy Scale: Good     Standing balance support: No upper extremity supported Standing balance-Leahy Scale: Fair                              Cognition Arousal/Alertness: Awake/alert Behavior During Therapy: WFL for tasks assessed/performed Overall Cognitive Status: Within Functional Limits for tasks assessed                                         Exercises      General Comments General comments (skin integrity, edema, etc.): VSS. No dizziness or light headedness      Pertinent Vitals/Pain Pain Assessment: Faces Faces Pain Scale: Hurts a little bit Pain Location: back and rt hip Pain Descriptors / Indicators: Grimacing Pain Intervention(s): Limited activity within patient's tolerance    Home Living                      Prior Function            PT Goals (current goals can now be found in the care plan section) Acute Rehab PT Goals Patient Stated Goal: return home and have THR on March 11. Progress towards PT goals: Progressing toward goals    Frequency    Min 3X/week      PT Plan Current plan remains appropriate    Co-evaluation              AM-PAC PT "6 Clicks" Mobility   Outcome Measure  Help needed turning from your back to your side while in a flat bed without using bedrails?: None Help needed moving from lying on your back to sitting on the side of a flat bed without using bedrails?: None Help needed moving to and from a bed to a chair (including a wheelchair)?:  None Help needed standing up from a chair using your arms (e.g., wheelchair or bedside chair)?: None Help needed to walk in hospital room?: None Help needed climbing 3-5 steps with a railing? : A Little 6 Click Score: 23    End of Session   Activity Tolerance: Patient tolerated treatment well Patient left: in chair;with call bell/phone within reach;with chair alarm set Nurse Communication: Mobility status PT Visit Diagnosis: Other abnormalities of gait and mobility (R26.89)     Time: 0017-4944 PT Time Calculation (min) (ACUTE ONLY): 19 min  Charges:  $Gait Training: 8-22 mins                     [place] Pager [phone] Office [place] 02/03/2021, 12:43 PM

## 2021-02-04 DIAGNOSIS — N3000 Acute cystitis without hematuria: Secondary | ICD-10-CM

## 2021-02-04 DIAGNOSIS — M549 Dorsalgia, unspecified: Secondary | ICD-10-CM | POA: Diagnosis not present

## 2021-02-04 DIAGNOSIS — G8929 Other chronic pain: Secondary | ICD-10-CM

## 2021-02-04 DIAGNOSIS — R55 Syncope and collapse: Secondary | ICD-10-CM | POA: Diagnosis not present

## 2021-02-04 LAB — BASIC METABOLIC PANEL
Anion gap: 9 (ref 5–15)
BUN: 15 mg/dL (ref 8–23)
CO2: 21 mmol/L — ABNORMAL LOW (ref 22–32)
Calcium: 9.2 mg/dL (ref 8.9–10.3)
Chloride: 107 mmol/L (ref 98–111)
Creatinine, Ser: 0.81 mg/dL (ref 0.44–1.00)
GFR, Estimated: >60 mL/min (ref >60)
Glucose, Bld: 109 mg/dL — ABNORMAL HIGH (ref 70–99)
Potassium: 4.4 mmol/L (ref 3.5–5.1)
Sodium: 137 mmol/L (ref 135–145)

## 2021-02-04 LAB — GLUCOSE, CAPILLARY
Glucose-Capillary: 167 mg/dL — ABNORMAL HIGH (ref 70–99)
Glucose-Capillary: 108 mg/dL — ABNORMAL HIGH (ref 70–99)
Glucose-Capillary: 116 mg/dL — ABNORMAL HIGH (ref 70–99)
Glucose-Capillary: 100 mg/dL — ABNORMAL HIGH (ref 70–99)
Glucose-Capillary: 123 mg/dL — ABNORMAL HIGH (ref 70–99)

## 2021-02-04 LAB — CBC
HCT: 33.7 % — ABNORMAL LOW (ref 36.0–46.0)
Hemoglobin: 11.2 g/dL — ABNORMAL LOW (ref 12.0–15.0)
MCH: 33.5 pg (ref 26.0–34.0)
MCHC: 33.2 g/dL (ref 30.0–36.0)
MCV: 100.9 fL — ABNORMAL HIGH (ref 80.0–100.0)
Platelets: 198 10*3/uL (ref 150–400)
RBC: 3.34 MIL/uL — ABNORMAL LOW (ref 3.87–5.11)
RDW: 13 % (ref 11.5–15.5)
WBC: 6 10*3/uL (ref 4.0–10.5)
nRBC: 0 % (ref 0.0–0.2)

## 2021-02-04 MED ORDER — HYDRALAZINE HCL 20 MG/ML IJ SOLN
10.0000 mg | INTRAMUSCULAR | Status: DC
  Filled 2021-02-04: qty 1

## 2021-02-04 MED ORDER — TRIAMTERENE-HCTZ 37.5-25 MG PO TABS: 1.0000 | ORAL_TABLET | Freq: Every day | ORAL | Status: DC

## 2021-02-04 MED ORDER — AMLODIPINE BESYLATE 10 MG PO TABS: 10.0000 mg | ORAL_TABLET | Freq: Every day | ORAL | 0 refills | Status: DC

## 2021-02-04 MED ORDER — ALPRAZOLAM 0.25 MG PO TABS
0.2500 mg | ORAL_TABLET | Freq: Two times a day (BID) | ORAL | Status: DC | PRN
  Administered 2021-02-04: 0.25 mg via ORAL
  Filled 2021-02-04: qty 1

## 2021-02-04 MED ORDER — AMLODIPINE BESYLATE 10 MG PO TABS
10.0000 mg | ORAL_TABLET | Freq: Every day | ORAL | Status: DC
  Administered 2021-02-04: 10 mg via ORAL
  Filled 2021-02-04: qty 1

## 2021-02-04 MED ORDER — POTASSIUM CHLORIDE CRYS ER 20 MEQ PO TBCR: 40.0000 meq | EXTENDED_RELEASE_TABLET | Freq: Once | ORAL | Status: DC

## 2021-02-04 MED ORDER — HYDRALAZINE HCL 20 MG/ML IJ SOLN: 10.0000 mg | Freq: Once | INTRAMUSCULAR | Status: DC

## 2021-02-04 NOTE — Progress Notes (Signed)
Christina Sellers, patients daughter called and stated that she was with her mother the last time she past out. Patient went limp eyes rolled back, was unresponsive for minutes. Magda Paganini held her mothers head as it was heavy dropped. While Magda Paganini was on the phone with EMS, patient began to vomit. Magda Paganini wanted the doctors to be aware that it was an unresponsive episode and the patient did lose consciousness for a short period of time. Will notify Dr. Broadus John.

## 2021-02-04 NOTE — TOC Initial Note (Signed)
Transition of Care [facility]) - Initial/Assessment Note    Patient Details  Name: Christina Sellers MRN: [id number] Date of Birth: February 18, 1941  Transition of Care [facility]) CM/SW Contact:    Bethena Roys, RN Phone Number: 02/04/2021, 1:21 PM  Clinical Narrative:  Patient presented for a syncopal episode. Prior to arrival patient was from home and has support of her children. The housekeeper is at the bedside and plans to transport home. Patient did not have a preference for home health services. Case Manager called [facility] and they cannot accept any [facility] Medicare patient's at this time. [facility] is seven days out for services. [facility] is delayed 2 weeks out to begin services. Case Manager then called [facility] and services can begin the weekend. The office will call the patient and daughter for start of care. No further needs identified at this time.                 Expected Discharge Plan: [place] Barriers to Discharge: No Barriers Identified   Patient Goals and CMS Choice Patient states their goals for this hospitalization and ongoing recovery are:: to return home   Choice offered to / list presented to :  (Patient did not have a preference.)  Expected Discharge Plan and Services Expected Discharge Plan: [place] In-house Referral: NA Discharge Planning Services: CM Consult Post Acute Care Choice: [place] arrangements for the past 2 months: Single Family Home Expected Discharge Date: 02/04/21               DME Arranged: N/A DME Agency: NA       HH Arranged: RN [place] Agency: [place] Date [facility] Agency Contacted: 02/04/21 Time HH Agency Contacted: 1300 Representative spoke with at [place]: Tommi Rumps  Prior Living Arrangements/Services Living arrangements for the past 2 months: [place] with:: Self (Has support of children) Patient language and need for interpreter reviewed:: Yes Do you feel safe  going back to the place where you live?: Yes      Need for Family Participation in Patient Care: Yes (Comment) Care giver support system in place?: Yes (comment)   Criminal Activity/Legal Involvement Pertinent to Current Situation/Hospitalization: No - Comment as needed  Activities of Daily Living Home Assistive Devices/Equipment: Walker (specify type) ADL Screening (condition at time of admission) Patient's cognitive ability adequate to safely complete daily activities?: No Is the patient deaf or have difficulty hearing?: No Does the patient have difficulty seeing, even when wearing glasses/contacts?: No Does the patient have difficulty concentrating, remembering, or making decisions?: Yes Patient able to express need for assistance with ADLs?: Yes Does the patient have difficulty dressing or bathing?: Yes Independently performs ADLs?: No Communication: Independent Dressing (OT): Needs assistance Is this a change from baseline?: Change from baseline, expected to last <3days Grooming: Needs assistance Is this a change from baseline?: Change from baseline, expected to last <3 days Feeding: Needs assistance Is this a change from baseline?: Change from baseline, expected to last <3 days Bathing: Needs assistance Is this a change from baseline?: Change from baseline, expected to last <3 days Toileting: Needs assistance Is this a change from baseline?: Change from baseline, expected to last <3 days In/Out Bed: Needs assistance Is this a change from baseline?: Change from baseline, expected to last <3 days Walks in Home: Needs assistance Is this a change from baseline?: Change from baseline, expected to last <3 days Does the patient have difficulty  walking or climbing stairs?: Yes Weakness of Legs: None Weakness of Arms/Hands: None  Permission Sought/Granted Permission sought to share information with : Family Supports,Case Nature conservation officer Permission granted to  share information with : Yes, Verbal Permission Granted     Permission granted to share info w AGENCY: [place]        Emotional Assessment Appearance:: Appears stated age Attitude/Demeanor/Rapport: Engaged Affect (typically observed): Appropriate Orientation: : Oriented to Situation,Oriented to  Time,Oriented to Place,Oriented to Self Alcohol / Substance Use: Not Applicable Psych Involvement: No (comment)  Admission diagnosis:  Dehydration [E86.0] Hyponatremia [K48.1] Metabolic acidosis [E56.3] Acute cystitis without hematuria [N30.00] Vomiting and diarrhea [R11.10, R19.7] Near syncope [R55] Chronic back pain, unspecified back location, unspecified back pain laterality [M54.9, G89.29] Patient Active Problem List   Diagnosis Date Noted  . Acute cystitis without hematuria   . Chronic back pain   . Syncope 02/01/2021  . E-coli UTI 02/01/2021  . Dehydration with hyponatremia 02/01/2021  . Nausea and vomiting 02/01/2021  . Acute diarrhea 02/01/2021  . Chronic diastolic CHF (congestive heart failure) ([place]) 02/01/2021  . Metabolic acidosis 14/97/0263  . Preoperative cardiovascular examination 01/21/2021  . Bilateral edema of lower extremity 01/21/2021  . Hypertension associated with diabetes ([place]) 01/21/2021  . Mixed diabetic hyperlipidemia associated with type 2 diabetes mellitus ([place]) 01/21/2021  . Aortic atherosclerosis ([place]) 01/21/2021  . Chronic low back pain 10/05/2019  . Type 2 diabetes mellitus with hyperglycemia, without long-term current use of insulin ([place]) 02/21/2019  . Hypothyroidism 02/21/2019  . Pain of left hip joint 12/08/2018  . Dermatochalasis of both eyelids 07/04/2018  . Viral labyrinthitis 01/13/2017  . Atherosclerotic peripheral vascular disease ([place])   . Essential hypertension, benign 11/22/2013  . Osteopenia 11/22/2013  . Labial fusion    PCP:  Josetta Huddle, MD Pharmacy:   [facility], [place] [address] Phone: [phone] Fax: [phone]   Readmission Risk Interventions No flowsheet data found.

## 2021-02-04 NOTE — Progress Notes (Signed)
PHARMACY - PHYSICIAN COMMUNICATION CRITICAL VALUE ALERT - BLOOD CULTURE IDENTIFICATION (BCID)  Christina Sellers is an 80 y.o. female who presented to [facility] on 02/01/2021  Assessment:  39 yof presenting with syncopal episode, recently completed Keflex for Ecoli UTI. 1 of 4 BCx bottles resulted as gram positive rods (BCID does not run on these).  Name of physician (or Provider) Contacted: [place], V  Current antibiotics: none  Changes to prescribed antibiotics recommended:  None - likely contaminant  No results found for this or any previous visit.   Arturo Morton, PharmD, BCPS Please check AMION for all [place] contact numbers Clinical Pharmacist 02/04/2021 10:18 PM

## 2021-02-04 NOTE — Progress Notes (Signed)
Patient reported feeling unsteady when ambulated from the bathroom to the chair, using walker. Stated it was in her head not her physical body that felt off. Stated she did not feel any weakness in legs.

## 2021-02-04 NOTE — Discharge Summary (Signed)
Physician Discharge Summary  Christina Sellers IRW:[id number] DOB: 17-Jan-1941 DOA: 02/01/2021  PCP: Josetta Huddle, MD  Admit date: 02/01/2021 Discharge date: 02/04/2021  Time spent: 35 minutes  Recommendations for Outpatient Follow-up:  1. PCP in 1 week 2. Home health RN, keep log of BP   Discharge Diagnoses:  Principal Problem:   Syncope Active Problems:   Essential hypertension, benign   Type 2 diabetes mellitus with hyperglycemia, without long-term current use of insulin (HCC)   Hypothyroidism   Chronic low back pain   Mixed diabetic hyperlipidemia associated with type 2 diabetes mellitus (HCC)   E-coli UTI   Dehydration with hyponatremia   Nausea and vomiting   Acute diarrhea   Chronic diastolic CHF (congestive heart failure) (HCC)   Metabolic acidosis   Acute cystitis without hematuria   Chronic back pain   Discharge Condition: stable  Diet recommendation: low sodium diabetic  Filed Weights   02/03/21 0407 02/04/21 0438  Weight: 65.3 kg 59.1 kg    History of present illness:  Christina Sellers is an 80 y.o. female past medical history of hypothyroidism, essential hypertension irritable bowel syndrome, peripheral vascular disease gout and insulin-dependent diabetes mellitus type 2, chronic diastolic heart failure comes in to [facility] emergency department after experiencing seen an episode of syncope for which she was evaluated in the ED which showed a CT scan of the head with no acute findings, she was hydrated past oral challenge test, UA showed possible UTI and was discharged on Keflex, she continued to have watery nonbloody stool several times a day, which she did not develop intensive lightheadedness and continue to have diarrhea and had an episode of loss of consciousness, in the ED imaging was inconclusive UA was unremarkable she was found to be hyponatremic with metabolic acidosis and multiple electrolyte abnormalities   Hospital Course:   Syncope possibly due  to orthostatic hypotension: Vasovagal event is also a possibility Orthostatic vitals not checked on admission, h/o dizziness upon standing has resolved now after hydration -She was aggressively fluid resuscitated her antihypertensive medications were held initially -no events on telemetry, ACS was ruled out -A 2D echocardiogram showed an EF of 65% with grade 1 diastolic heart failure. -BP trending up now and home meds resumed at discharge  E. coli UTI: Urine culture on 01/27/2021 in the emergency room showed E. coli she was started on Keflex and completed 4-day course.  Uncontrolled HTN -BP was soft/low initially, now 170-180 range, resume triamterene/HCTZ and start amlodipine -continue Toprol   Nausea vomiting and acute diarrhea: In the setting of UTI and recent Abx -resolved  Hyponatremia: -resolved with hydration  New Hypokalemia: -repleted  Diabetes mellitus type 2 with hyperglycemia: Holding Metformin, hemoglobin A1c of 5.8.  Hypothyroidism: Continue Synthroid.  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  Chronic diastolic heart failure: -euvolemic now, resume HCTZ at DC   Discharge Exam: Vitals:   02/04/21 1129 02/04/21 1534  BP: (!) 168/50 (!) 198/62  Pulse: 72   Resp: 16   Temp: 97.8 F (36.6 C)   SpO2: 95%     General: AAOx3 Cardiovascular: S1S2/RRR Respiratory: CTAB  Discharge Instructions   Discharge Instructions    Diet - low sodium heart healthy   Complete by: As directed    Diet Carb Modified   Complete by: As directed    Increase activity slowly   Complete by: As directed      Allergies as of 02/04/2021      Reactions  Celexa [citalopram Hydrobromide] Other (See Comments)   nightmares   Fluconazole Other (See Comments)   Unknown reaction   Green Tea Leaf Ext Other (See Comments)   Mouth ulcers   Kenalog [triamcinolone] Other (See Comments)   Spinal injection caused sweats   Levaquin  [levofloxacin] Hives   Losartan Potassium-hctz Other (See Comments)   Night sweats/ fatigue   Methocarbamol Other (See Comments)   500 mg is too much   Nitrofurantoin Macrocrystal Other (See Comments)   Possible vertigo   Scopolamine Other (See Comments)   Memory change   Sulfa Antibiotics Other (See Comments)   Unknown reaction   Trimethoprim Hives      Medication List    STOP taking these medications   cephALEXin 500 MG capsule Commonly known as: Keflex     TAKE these medications   acetaminophen 650 MG CR tablet Commonly known as: TYLENOL Take 1,300 mg by mouth 2 (two) times daily as needed for pain.   amLODipine 10 MG tablet Commonly known as: NORVASC Take 1 tablet (10 mg total) by mouth daily. Start taking on: February 05, 2021   BIOFREEZE EX Apply 1 application topically 2 (two) times daily as needed (pain).   cycloSPORINE 0.05 % ophthalmic emulsion Commonly known as: RESTASIS Place 1 drop into both eyes 2 (two) times daily.   denosumab 60 MG/ML Sosy injection Commonly known as: PROLIA Inject 60 mg into the skin every 6 (six) months.   diclofenac sodium 1 % Gel Commonly known as: VOLTAREN Apply 1 application topically 2 (two) times daily as needed (pain).   fluticasone 50 MCG/ACT nasal spray Commonly known as: FLONASE Place 1 spray into both nostrils daily as needed for allergies or rhinitis.   levothyroxine 100 MCG tablet Commonly known as: SYNTHROID Take 50-100 mcg by mouth See admin instructions. Take 1/2 tablet (50 mcg) by mouth Sunday, Tuesday, Thursday, Saturday, take 1 tablet (100 mcg) Monday, Wednesday, Friday   meloxicam 15 MG tablet Commonly known as: MOBIC Take 7.5 mg by mouth every other day.   METAMUCIL PO Take 1 Scoop by mouth at bedtime as needed (constipation).   metFORMIN 500 MG tablet Commonly known as: GLUCOPHAGE Take 500 mg by mouth daily.   methocarbamol 500 MG tablet Commonly known as: ROBAXIN Take 250 mg by mouth 3 (three)  times daily as needed for muscle spasms (back pain).   Methylcobalamin 1000 MCG Tbdp Take 1,000 mcg by mouth daily.   metoprolol succinate 25 MG 24 hr tablet Commonly known as: TOPROL-XL Take 25 mg by mouth at bedtime.   multivitamin with minerals Tabs tablet Take 1 tablet by mouth daily.   ondansetron 4 MG disintegrating tablet Commonly known as: Zofran ODT Take 1 tablet (4 mg total) by mouth every 8 (eight) hours as needed for nausea or vomiting.   rosuvastatin 5 MG tablet Commonly known as: CRESTOR Take 5 mg by mouth daily. What changed: Another medication with the same name was removed. Continue taking this medication, and follow the directions you see here.   Systane Overnight Therapy 0.3 % Gel ophthalmic ointment Generic drug: hypromellose Place 1 application into both eyes at bedtime.   triamterene-hydrochlorothiazide 37.5-25 MG tablet Commonly known as: MAXZIDE-25 Take 1 tablet by mouth daily. What changed:   when to take this  reasons to take this   Vitamin C 500 MG Caps Take 500 mg by mouth daily.   Vitamin D3 50 MCG (2000 UT) capsule Take 2,000 mg by mouth daily.   Vitamin  E 134 MG (200 UNIT) Caps Take 400 Units by mouth daily.      Allergies  Allergen Reactions  . Celexa [Citalopram Hydrobromide] Other (See Comments)    nightmares  . Fluconazole Other (See Comments)    Unknown reaction  . Green Tea Leaf Ext Other (See Comments)    Mouth ulcers  . Kenalog [Triamcinolone] Other (See Comments)    Spinal injection caused sweats  . Levaquin [Levofloxacin] Hives  . Losartan Potassium-Hctz Other (See Comments)    Night sweats/ fatigue  . Methocarbamol Other (See Comments)    500 mg is too much  . Nitrofurantoin Macrocrystal Other (See Comments)    Possible vertigo  . Scopolamine Other (See Comments)    Memory change  . Sulfa Antibiotics Other (See Comments)    Unknown reaction  . Trimethoprim Hives    Follow-up Information    Josetta Huddle,  MD. Schedule an appointment as soon as possible for a visit in 1 week(s).   Specialty: Internal Medicine Contact information: [address] [place] [address] [phone]        [facility] Follow up.   Specialty: Home Health Services Why: Registered Nurse-office to call with visit times.  Contact information: [address] [place] [address] [phone]        Erlene Quan, PA-C Follow up on 02/11/2021.   Specialties: Cardiology, Radiology Why: Kerin Ransom 3/9 @10 :45 am  Contact information: [place] [address] [phone]                The results of significant diagnostics from this hospitalization (including imaging, microbiology, ancillary and laboratory) are listed below for reference.    Significant Diagnostic Studies: DG Lumbar Spine Complete  Result Date: 02/01/2021 CLINICAL DATA:  Syncopal episode 5 days ago EXAM: LUMBAR SPINE - COMPLETE 4+ VIEW COMPARISON:  August 08, 2020 FINDINGS: There are five non-rib bearing lumbar-type vertebral bodies. Levocurvature of the lumbar spine and dextrocurvature of the thoracolumbar junction, unchanged. Unchanged grade 1 anterolisthesis of L4-5. Unchanged mild wedging of T12. There is no evidence for acute fracture or subluxation. Severe intervertebral disc space height loss of T11-12, L3-4 and L2-3. Moderate intervertebral disc space height loss of L4-5 and L5-S1. Lower lumbar facet arthropathy. Atherosclerotic calcifications of the aorta. Surgical clips project over the RIGHT upper quadrant. IMPRESSION: 1. No acute osseous abnormality. 2. Unchanged multilevel degenerative disc disease, severe at T11-12, L3-4 and L2-3. 3. Unchanged grade 1 anterolisthesis of L4-5. Electronically Signed   By: Valentino Saxon MD   On: 02/01/2021 17:12   CT HEAD WO CONTRAST  Result Date: 02/01/2021 CLINICAL DATA:  Syncopal episode 5 days ago, left eye bruising,  persistent nausea vomiting. EXAM: CT HEAD WITHOUT CONTRAST TECHNIQUE: Contiguous axial images were obtained from the base of the skull through the vertex without intravenous contrast. COMPARISON:  Head CT January 27, 2021 FINDINGS: Brain: No evidence of acute large vascular territory infarction, hemorrhage, hydrocephalus, extra-axial collection or mass lesion/mass effect. Mild parenchymal volume loss with ex vacuo dilatation of ventricular system. Similar periventricular white matter hypoattenuation, which likely represents chronic small vessel ischemic disease. Vascular: No hyperdense vessel. Vascular calcifications at the skull base. Skull: No acute osseous abnormality. Sinuses/Orbits: No acute finding. Other: Decreased left face and periorbital soft tissue swelling IMPRESSION: 1. No acute intracranial findings. 2. Decreased left face and periorbital soft tissue swelling. 3. Similar mild parenchymal volume loss and chronic small vessel ischemic disease.  Electronically Signed   By: Dahlia Bailiff MD   On: 02/01/2021 14:16   CT Head Wo Contrast  Result Date: 01/27/2021 CLINICAL DATA:  Syncopal episode with a fall from standing. EXAM: CT HEAD WITHOUT CONTRAST CT MAXILLOFACIAL WITHOUT CONTRAST TECHNIQUE: Multidetector CT imaging of the head and maxillofacial structures were performed using the standard protocol without intravenous contrast. Multiplanar CT image reconstructions of the maxillofacial structures were also generated. COMPARISON:  None. FINDINGS: CT HEAD FINDINGS Brain: No evidence of acute infarction, hemorrhage, hydrocephalus, extra-axial collection or mass lesion/mass effect. There is mild cerebral volume loss with associated ex vacuo dilatation. Periventricular white matter hypoattenuation likely represents chronic small vessel ischemic disease. Vascular: There are vascular calcifications in the carotid siphons. Skull: Normal. Negative for fracture or focal lesion. Other: There is a small right  mastoid effusion. CT MAXILLOFACIAL FINDINGS Osseous: No fracture or mandibular dislocation. No destructive process. Orbits: Negative. No traumatic or inflammatory finding. Sinuses: There is left sphenoid sinus disease. Soft tissues: There is left face and periorbital soft tissue swelling. IMPRESSION: 1. No acute intracranial process. 2. No evidence of acute facial bone fracture. Electronically Signed   By: Zerita Boers M.D.   On: 01/27/2021 20:29   MR BRAIN WO CONTRAST  Result Date: 02/01/2021 CLINICAL DATA:  Neuro deficit, acute, stroke suspected EXAM: MRI HEAD WITHOUT CONTRAST TECHNIQUE: Multiplanar, multiecho pulse sequences of the brain and surrounding structures were obtained without intravenous contrast. COMPARISON:  02/01/2021 and prior. FINDINGS: Brain: No diffusion-weighted signal abnormality. No intracranial hemorrhage. No midline shift, ventriculomegaly or extra-axial fluid collection. No mass lesion. Mild cerebral atrophy with ex vacuo dilatation. Moderate chronic microvascular ischemic changes. Chronic right cerebellar lacunar insult. Vascular: Proximally preserved major intracranial flow voids. Skull and upper cervical spine: Normal marrow signal. Sinuses/Orbits: Sequela of bilateral lens replacement. Globes are intact. Decreased left periorbital soft tissue swelling. Pneumatized paranasal sinuses. Small right mastoid effusion. Other: None. IMPRESSION: No acute intracranial process. Chronic right cerebellar lacunar insult. Mild cerebral atrophy. Moderate chronic microvascular ischemic changes. Electronically Signed   By: Primitivo Gauze M.D.   On: 02/01/2021 19:08   ECHOCARDIOGRAM COMPLETE  Result Date: 02/02/2021    ECHOCARDIOGRAM REPORT   Patient Name:   Christina Sellers Date of Exam: 02/02/2021 Medical Rec #:  [id number]         Height:       58.0 in Accession #:    [id number]        Weight:       135.0 lb Date of Birth:  Oct 05, 1941         BSA:          1.541 m Patient Age:    57 years           BP:           147/53 mmHg Patient Gender: F                 HR:           71 bpm. Exam Location:  Inpatient Procedure: 2D Echo, Cardiac Doppler and Color Doppler Indications:    R55 Syncope  History:        Patient has prior history of Echocardiogram examinations, most                 recent 06/23/2017. COPD and Carotid Disease; Risk                 Factors:Hypertension and Dyslipidemia. GERD. Thyroid Disease.  Sonographer:  [facility] Referring Phys: [id number] [place]  1. Left ventricular ejection fraction, by estimation, is 65 to 70%. The left ventricle has normal function. The left ventricle has no regional wall motion abnormalities. Left ventricular diastolic parameters are consistent with Grade I diastolic dysfunction (impaired relaxation).  2. Right ventricular systolic function is normal. The right ventricular size is mildly enlarged. There is normal pulmonary artery systolic pressure.  3. Left atrial size was mild to moderately dilated.  4. The mitral valve is normal in structure. Mild mitral valve regurgitation. No evidence of mitral stenosis. Moderate mitral annular calcification.  5. Tricuspid valve regurgitation is mild to moderate.  6. The aortic valve is normal in structure. Aortic valve regurgitation is not visualized. No aortic stenosis is present.  7. The inferior vena cava is normal in size with greater than 50% respiratory variability, suggesting right atrial pressure of 3 mmHg. FINDINGS  Left Ventricle: Left ventricular ejection fraction, by estimation, is 65 to 70%. The left ventricle has normal function. The left ventricle has no regional wall motion abnormalities. The left ventricular internal cavity size was normal in size. There is  no left ventricular hypertrophy. Left ventricular diastolic parameters are consistent with Grade I diastolic dysfunction (impaired relaxation). Normal left ventricular filling pressure. Right Ventricle: The right ventricular size  is mildly enlarged. No increase in right ventricular wall thickness. Right ventricular systolic function is normal. There is normal pulmonary artery systolic pressure. The tricuspid regurgitant velocity is 2.46  m/s, and with an assumed right atrial pressure of 3 mmHg, the estimated right ventricular systolic pressure is 61.6 mmHg. Left Atrium: Left atrial size was mild to moderately dilated. Right Atrium: Right atrial size was normal in size. Pericardium: There is no evidence of pericardial effusion. Mitral Valve: The mitral valve is normal in structure. Moderate mitral annular calcification. Mild mitral valve regurgitation. No evidence of mitral valve stenosis. Tricuspid Valve: The tricuspid valve is normal in structure. Tricuspid valve regurgitation is mild to moderate. No evidence of tricuspid stenosis. Aortic Valve: The aortic valve is normal in structure. Aortic valve regurgitation is not visualized. No aortic stenosis is present. Pulmonic Valve: The pulmonic valve was normal in structure. Pulmonic valve regurgitation is not visualized. No evidence of pulmonic stenosis. Aorta: The aortic root is normal in size and structure. Venous: The inferior vena cava is normal in size with greater than 50% respiratory variability, suggesting right atrial pressure of 3 mmHg. IAS/Shunts: No atrial level shunt detected by color flow Doppler.  LEFT VENTRICLE PLAX 2D LVIDd:         4.20 cm  Diastology LVIDs:         2.70 cm  LV e' medial:    6.85 cm/s LV PW:         1.10 cm  LV E/e' medial:  11.6 LV IVS:        0.90 cm  LV e' lateral:   6.31 cm/s LVOT diam:     2.00 cm  LV E/e' lateral: 12.6 LV SV:         82 LV SV Index:   53 LVOT Area:     3.14 cm  RIGHT VENTRICLE             IVC RV Basal diam:  2.40 cm     IVC diam: 1.60 cm RV S prime:     11.60 cm/s TAPSE (M-mode): 2.4 cm LEFT ATRIUM             Index  RIGHT ATRIUM           Index LA diam:        4.80 cm 3.11 cm/m  RA Area:     20.70 cm LA Vol (A2C):   65.9 ml  42.77 ml/m RA Volume:   64.80 ml  42.05 ml/m LA Vol (A4C):   56.6 ml 36.73 ml/m LA Biplane Vol: 63.0 ml 40.88 ml/m  AORTIC VALVE LVOT Vmax:   110.00 cm/s LVOT Vmean:  71.900 cm/s LVOT VTI:    0.262 m  AORTA Ao Root diam: 2.60 cm Ao Asc diam:  2.50 cm MITRAL VALVE                TRICUSPID VALVE MV Area (PHT): 2.95 cm     TR Peak grad:   24.2 mmHg MV Decel Time: 257 msec     TR Vmax:        246.00 cm/s MV E velocity: 79.30 cm/s MV A velocity: 120.00 cm/s  SHUNTS MV E/A ratio:  0.66         Systemic VTI:  0.26 m                             Systemic Diam: 2.00 cm Ena Dawley MD Electronically signed by Ena Dawley MD Signature Date/Time: 02/02/2021/10:56:45 AM    Final    DG Hips Bilat W or Wo Pelvis 2 Views  Result Date: 02/01/2021 CLINICAL DATA:  Syncope EXAM: DG HIP (WITH OR WITHOUT PELVIS) 2V BILAT COMPARISON:  Apr 18, 2019 FINDINGS: Osteopenia. No acute fracture or dislocation. Degenerative changes of the lower lumbar spine. Hip joints are relatively preserved. No area of erosion or osseous destruction. No unexpected radiopaque foreign body. Vascular calcifications. IMPRESSION: No acute osseous abnormality. If persistent concern for nondisplaced hip or pelvic fracture, recommend dedicated pelvic MRI. Electronically Signed   By: Valentino Saxon MD   On: 02/01/2021 17:13   CT Maxillofacial WO CM  Result Date: 01/27/2021 CLINICAL DATA:  Syncopal episode with a fall from standing. EXAM: CT HEAD WITHOUT CONTRAST CT MAXILLOFACIAL WITHOUT CONTRAST TECHNIQUE: Multidetector CT imaging of the head and maxillofacial structures were performed using the standard protocol without intravenous contrast. Multiplanar CT image reconstructions of the maxillofacial structures were also generated. COMPARISON:  None. FINDINGS: CT HEAD FINDINGS Brain: No evidence of acute infarction, hemorrhage, hydrocephalus, extra-axial collection or mass lesion/mass effect. There is mild cerebral volume loss with associated ex  vacuo dilatation. Periventricular white matter hypoattenuation likely represents chronic small vessel ischemic disease. Vascular: There are vascular calcifications in the carotid siphons. Skull: Normal. Negative for fracture or focal lesion. Other: There is a small right mastoid effusion. CT MAXILLOFACIAL FINDINGS Osseous: No fracture or mandibular dislocation. No destructive process. Orbits: Negative. No traumatic or inflammatory finding. Sinuses: There is left sphenoid sinus disease. Soft tissues: There is left face and periorbital soft tissue swelling. IMPRESSION: 1. No acute intracranial process. 2. No evidence of acute facial bone fracture. Electronically Signed   By: Zerita Boers M.D.   On: 01/27/2021 20:29    Microbiology: Recent Results (from the past 240 hour(s))  Urine culture     Status: Abnormal   Collection Time: 01/27/21  1:55 PM   Specimen: Urine, Random  Result Value Ref Range Status   Specimen Description URINE, RANDOM  Final   Special Requests   Final    ADDED 2227 Performed at [facility], [place],  [place] [address]    Culture >=100,000 COLONIES/mL ESCHERICHIA COLI (A)  Final   Report Status 01/30/2021 FINAL  Final   Organism ID, Bacteria ESCHERICHIA COLI (A)  Final      Susceptibility   Escherichia coli - MIC*    AMPICILLIN 8 SENSITIVE Sensitive     CEFAZOLIN <=4 SENSITIVE Sensitive     CEFEPIME <=0.12 SENSITIVE Sensitive     CEFTRIAXONE <=0.25 SENSITIVE Sensitive     CIPROFLOXACIN <=0.25 SENSITIVE Sensitive     GENTAMICIN <=1 SENSITIVE Sensitive     IMIPENEM <=0.25 SENSITIVE Sensitive     NITROFURANTOIN <=16 SENSITIVE Sensitive     TRIMETH/SULFA <=20 SENSITIVE Sensitive     AMPICILLIN/SULBACTAM 4 SENSITIVE Sensitive     PIP/TAZO <=4 SENSITIVE Sensitive     * >=100,000 COLONIES/mL ESCHERICHIA COLI  Resp Panel by RT-PCR (Flu A&B, Covid) Nasopharyngeal Swab     Status: None   Collection Time: 02/01/21  1:35 PM   Specimen: Nasopharyngeal Swab;  Nasopharyngeal(NP) swabs in vial transport medium  Result Value Ref Range Status   SARS Coronavirus 2 by RT PCR NEGATIVE NEGATIVE Final    Comment: (NOTE) SARS-CoV-2 target nucleic acids are NOT DETECTED.  The SARS-CoV-2 RNA is generally detectable in upper respiratory specimens during the acute phase of infection. The lowest concentration of SARS-CoV-2 viral copies this assay can detect is 138 copies/mL. A negative result does not preclude SARS-Cov-2 infection and should not be used as the sole basis for treatment or other patient management decisions. A negative result may occur with  improper specimen collection/handling, submission of specimen other than nasopharyngeal swab, presence of viral mutation(s) within the areas targeted by this assay, and inadequate number of viral copies(<138 copies/mL). A negative result must be combined with clinical observations, patient history, and epidemiological information. The expected result is Negative.  Fact Sheet for Patients:  EntrepreneurPulse.com.au  Fact Sheet for Healthcare Providers:  IncredibleEmployment.be  This test is no t yet approved or cleared by the [place] FDA and  has been authorized for detection and/or diagnosis of SARS-CoV-2 by FDA under an Emergency Use Authorization (EUA). This EUA will remain  in effect (meaning this test can be used) for the duration of the COVID-19 declaration under Section 564(b)(1) of the Act, 21 U.S.C.section 360bbb-3(b)(1), unless the authorization is terminated  or revoked sooner.       Influenza A by PCR NEGATIVE NEGATIVE Final   Influenza B by PCR NEGATIVE NEGATIVE Final    Comment: (NOTE) The Xpert Xpress SARS-CoV-2/FLU/RSV plus assay is intended as an aid in the diagnosis of influenza from Nasopharyngeal swab specimens and should not be used as a sole basis for treatment. Nasal washings and aspirates are unacceptable for Xpert Xpress  SARS-CoV-2/FLU/RSV testing.  Fact Sheet for Patients: EntrepreneurPulse.com.au  Fact Sheet for Healthcare Providers: IncredibleEmployment.be  This test is not yet approved or cleared by the [place] FDA and has been authorized for detection and/or diagnosis of SARS-CoV-2 by FDA under an Emergency Use Authorization (EUA). This EUA will remain in effect (meaning this test can be used) for the duration of the COVID-19 declaration under Section 564(b)(1) of the Act, 21 U.S.C. section 360bbb-3(b)(1), unless the authorization is terminated or revoked.  Performed at [facility], [place] [address] [place], [place] [address]   Culture, blood (Routine X 2) w Reflex to ID Panel     Status: None (Preliminary result)   Collection Time: 02/01/21 10:00 PM   Specimen: BLOOD RIGHT HAND  Result  Value Ref Range Status   Specimen Description BLOOD RIGHT HAND  Final   Special Requests   Final    BOTTLES DRAWN AEROBIC ONLY Blood Culture adequate volume   Culture   Final    NO GROWTH 3 DAYS Performed at [facility] [facility], [address] [place], [place] [address]    Report Status PENDING  Incomplete  Culture, blood (Routine X 2) w Reflex to ID Panel     Status: None (Preliminary result)   Collection Time: 02/01/21 10:03 PM   Specimen: BLOOD  Result Value Ref Range Status   Specimen Description BLOOD LEFT ANTECUBITAL  Final   Special Requests   Final    BOTTLES DRAWN AEROBIC AND ANAEROBIC Blood Culture results may not be optimal due to an inadequate volume of blood received in culture bottles   Culture   Final    NO GROWTH 3 DAYS Performed at [facility], [place] [address], [place], [place] [address]    Report Status PENDING  Incomplete     Labs: Basic Metabolic Panel: Recent Labs  Lab 02/01/21 1309 02/01/21 2200 02/02/21 0151 02/03/21 0717 02/04/21 0750  NA 128* 126* 131* 137 137  K 3.9 3.4* 3.5 3.3* 4.4  CL 98 96* 103 108 107  CO2  16* 18* 21* 21* 21*  GLUCOSE 168* 112* 93 93 109*  BUN 34* 25* 23 13 15   CREATININE 1.08* 0.95 0.93 0.85 0.81  CALCIUM 9.6 8.8* 8.6* 8.8* 9.2  MG  --   --  1.8 1.9  --   PHOS  --   --  3.1  --   --    Liver Function Tests: Recent Labs  Lab 02/01/21 1309 02/02/21 0151  AST 27 18  ALT 14 12  ALKPHOS 46 41  BILITOT 1.1 1.2  PROT 6.9 5.6*  ALBUMIN 4.2 3.3*   Recent Labs  Lab 02/01/21 1309  LIPASE 30   No results for input(s): AMMONIA in the last 168 hours. CBC: Recent Labs  Lab 02/01/21 1309 02/01/21 1335 02/02/21 0151 02/04/21 0750  WBC TEST CANCELLED PER RN 8.6 7.7 6.0  NEUTROABS  --  5.4 4.7  --   HGB TEST CANCELLED PER RN 12.5 10.6* 11.2*  HCT TEST CANCELLED PER RN 37.3 31.1* 33.7*  MCV TEST CANCELLED PER RN 100.5* 98.4 100.9*  PLT TEST CANCELLED PER RN 211 184 198   Cardiac Enzymes: No results for input(s): CKTOTAL, CKMB, CKMBINDEX, TROPONINI in the last 168 hours. BNP: BNP (last 3 results) Recent Labs    02/01/21 2200  BNP 139.0*    ProBNP (last 3 results) Recent Labs    01/21/21 1223  PROBNP 551    CBG: Recent Labs  Lab 02/03/21 1605 02/03/21 2025 02/04/21 0811 02/04/21 1126 02/04/21 1632  GLUCAP 167* 115* 108* 116* 100*   Signed:  Domenic Polite MD.  [facility] 02/04/2021, 5:16 PM

## 2021-02-05 LAB — GLUCOSE, CAPILLARY
Glucose-Capillary: 88 mg/dL (ref 70–99)
Glucose-Capillary: 101 mg/dL — ABNORMAL HIGH (ref 70–99)

## 2021-02-05 MED ORDER — AMLODIPINE BESYLATE 5 MG PO TABS
5.0000 mg | ORAL_TABLET | Freq: Every day | ORAL | Status: DC
  Administered 2021-02-05: 5 mg via ORAL
  Filled 2021-02-05: qty 1

## 2021-02-05 MED ORDER — AMLODIPINE BESYLATE 5 MG PO TABS: 5.0000 mg | ORAL_TABLET | Freq: Every day | ORAL | 0 refills | Status: DC

## 2021-02-05 NOTE — Progress Notes (Signed)
Patient ambulated in hall with walker, steady gait, no intolerance. Denied lightheadedness, dizziness, chest pain or shortness of breath. Stated she felt well and wanted to go home today.

## 2021-02-05 NOTE — Plan of Care (Signed)
Patient ready for discharge with all goals met.

## 2021-02-06 LAB — CULTURE, BLOOD (ROUTINE X 2)
Culture: NO GROWTH
Special Requests: ADEQUATE

## 2021-02-08 DIAGNOSIS — S51802D Unspecified open wound of left forearm, subsequent encounter: Secondary | ICD-10-CM | POA: Diagnosis not present

## 2021-02-08 DIAGNOSIS — I5032 Chronic diastolic (congestive) heart failure: Secondary | ICD-10-CM | POA: Diagnosis not present

## 2021-02-08 DIAGNOSIS — I110 Hypertensive heart disease with heart failure: Secondary | ICD-10-CM | POA: Diagnosis not present

## 2021-02-08 DIAGNOSIS — K529 Noninfective gastroenteritis and colitis, unspecified: Secondary | ICD-10-CM | POA: Diagnosis not present

## 2021-02-08 DIAGNOSIS — I081 Rheumatic disorders of both mitral and tricuspid valves: Secondary | ICD-10-CM | POA: Diagnosis not present

## 2021-02-08 LAB — CULTURE, BLOOD (ROUTINE X 2)

## 2021-02-09 ENCOUNTER — Other Ambulatory Visit (HOSPITAL_COMMUNITY): Payer: Medicare Other

## 2021-02-11 ENCOUNTER — Observation Stay (HOSPITAL_COMMUNITY): Payer: Medicare Other

## 2021-02-11 ENCOUNTER — Encounter: Payer: Self-pay | Admitting: Cardiology

## 2021-02-11 ENCOUNTER — Inpatient Hospital Stay (HOSPITAL_COMMUNITY)
Admission: EM | Admit: 2021-02-11 | Discharge: 2021-02-16 | DRG: 312 | Disposition: A | Discharge status: Home Health | Payer: Medicare Other | Attending: Internal Medicine | Admitting: Internal Medicine

## 2021-02-11 ENCOUNTER — Emergency Department (HOSPITAL_COMMUNITY): Payer: Medicare Other

## 2021-02-11 ENCOUNTER — Ambulatory Visit: Payer: Medicare Other | Admitting: Cardiology

## 2021-02-11 ENCOUNTER — Other Ambulatory Visit: Payer: Self-pay

## 2021-02-11 VITALS — BP 124/78 | HR 69 | Ht 59.0 in | Wt 125.0 lb

## 2021-02-11 DIAGNOSIS — E039 Hypothyroidism, unspecified: Secondary | ICD-10-CM | POA: Diagnosis present

## 2021-02-11 DIAGNOSIS — E785 Hyperlipidemia, unspecified: Secondary | ICD-10-CM | POA: Diagnosis not present

## 2021-02-11 DIAGNOSIS — R0989 Other specified symptoms and signs involving the circulatory and respiratory systems: Secondary | ICD-10-CM | POA: Diagnosis not present

## 2021-02-11 DIAGNOSIS — E872 Acidosis: Secondary | ICD-10-CM | POA: Diagnosis present

## 2021-02-11 DIAGNOSIS — M25551 Pain in right hip: Secondary | ICD-10-CM | POA: Diagnosis not present

## 2021-02-11 DIAGNOSIS — Z9842 Cataract extraction status, left eye: Secondary | ICD-10-CM

## 2021-02-11 DIAGNOSIS — Z87898 Personal history of other specified conditions: Secondary | ICD-10-CM | POA: Diagnosis not present

## 2021-02-11 DIAGNOSIS — Z743 Need for continuous supervision: Secondary | ICD-10-CM | POA: Diagnosis not present

## 2021-02-11 DIAGNOSIS — Z20822 Contact with and (suspected) exposure to covid-19: Secondary | ICD-10-CM | POA: Diagnosis not present

## 2021-02-11 DIAGNOSIS — R197 Diarrhea, unspecified: Secondary | ICD-10-CM | POA: Diagnosis not present

## 2021-02-11 DIAGNOSIS — K219 Gastro-esophageal reflux disease without esophagitis: Secondary | ICD-10-CM | POA: Diagnosis present

## 2021-02-11 DIAGNOSIS — Z7984 Long term (current) use of oral hypoglycemic drugs: Secondary | ICD-10-CM | POA: Diagnosis not present

## 2021-02-11 DIAGNOSIS — J309 Allergic rhinitis, unspecified: Secondary | ICD-10-CM | POA: Diagnosis present

## 2021-02-11 DIAGNOSIS — K589 Irritable bowel syndrome without diarrhea: Secondary | ICD-10-CM | POA: Diagnosis not present

## 2021-02-11 DIAGNOSIS — Z888 Allergy status to other drugs, medicaments and biological substances status: Secondary | ICD-10-CM | POA: Diagnosis not present

## 2021-02-11 DIAGNOSIS — I10 Essential (primary) hypertension: Secondary | ICD-10-CM

## 2021-02-11 DIAGNOSIS — K580 Irritable bowel syndrome with diarrhea: Secondary | ICD-10-CM | POA: Diagnosis not present

## 2021-02-11 DIAGNOSIS — R110 Nausea: Secondary | ICD-10-CM | POA: Diagnosis not present

## 2021-02-11 DIAGNOSIS — Z01810 Encounter for preprocedural cardiovascular examination: Secondary | ICD-10-CM

## 2021-02-11 DIAGNOSIS — E860 Dehydration: Secondary | ICD-10-CM | POA: Diagnosis present

## 2021-02-11 DIAGNOSIS — Z79899 Other long term (current) drug therapy: Secondary | ICD-10-CM | POA: Diagnosis not present

## 2021-02-11 DIAGNOSIS — I6521 Occlusion and stenosis of right carotid artery: Secondary | ICD-10-CM | POA: Diagnosis present

## 2021-02-11 DIAGNOSIS — E861 Hypovolemia: Secondary | ICD-10-CM | POA: Diagnosis not present

## 2021-02-11 DIAGNOSIS — F419 Anxiety disorder, unspecified: Secondary | ICD-10-CM | POA: Diagnosis present

## 2021-02-11 DIAGNOSIS — E1151 Type 2 diabetes mellitus with diabetic peripheral angiopathy without gangrene: Secondary | ICD-10-CM | POA: Diagnosis not present

## 2021-02-11 DIAGNOSIS — R634 Abnormal weight loss: Secondary | ICD-10-CM | POA: Diagnosis present

## 2021-02-11 DIAGNOSIS — S51812A Laceration without foreign body of left forearm, initial encounter: Secondary | ICD-10-CM | POA: Diagnosis not present

## 2021-02-11 DIAGNOSIS — E1165 Type 2 diabetes mellitus with hyperglycemia: Secondary | ICD-10-CM | POA: Diagnosis not present

## 2021-02-11 DIAGNOSIS — Z9841 Cataract extraction status, right eye: Secondary | ICD-10-CM

## 2021-02-11 DIAGNOSIS — M7989 Other specified soft tissue disorders: Secondary | ICD-10-CM | POA: Diagnosis not present

## 2021-02-11 DIAGNOSIS — I358 Other nonrheumatic aortic valve disorders: Secondary | ICD-10-CM | POA: Diagnosis present

## 2021-02-11 DIAGNOSIS — I110 Hypertensive heart disease with heart failure: Secondary | ICD-10-CM | POA: Diagnosis present

## 2021-02-11 DIAGNOSIS — R55 Syncope and collapse: Principal | ICD-10-CM | POA: Diagnosis present

## 2021-02-11 DIAGNOSIS — R404 Transient alteration of awareness: Secondary | ICD-10-CM | POA: Diagnosis not present

## 2021-02-11 DIAGNOSIS — T502X5A Adverse effect of carbonic-anhydrase inhibitors, benzothiadiazides and other diuretics, initial encounter: Secondary | ICD-10-CM | POA: Diagnosis present

## 2021-02-11 DIAGNOSIS — Z87891 Personal history of nicotine dependence: Secondary | ICD-10-CM

## 2021-02-11 DIAGNOSIS — I5032 Chronic diastolic (congestive) heart failure: Secondary | ICD-10-CM | POA: Diagnosis not present

## 2021-02-11 DIAGNOSIS — Z882 Allergy status to sulfonamides status: Secondary | ICD-10-CM | POA: Diagnosis not present

## 2021-02-11 DIAGNOSIS — M3500 Sicca syndrome, unspecified: Secondary | ICD-10-CM | POA: Diagnosis present

## 2021-02-11 DIAGNOSIS — F32A Depression, unspecified: Secondary | ICD-10-CM | POA: Diagnosis present

## 2021-02-11 DIAGNOSIS — R112 Nausea with vomiting, unspecified: Secondary | ICD-10-CM | POA: Diagnosis not present

## 2021-02-11 DIAGNOSIS — I951 Orthostatic hypotension: Secondary | ICD-10-CM | POA: Diagnosis present

## 2021-02-11 DIAGNOSIS — E7800 Pure hypercholesterolemia, unspecified: Secondary | ICD-10-CM | POA: Diagnosis present

## 2021-02-11 DIAGNOSIS — K5730 Diverticulosis of large intestine without perforation or abscess without bleeding: Secondary | ICD-10-CM | POA: Diagnosis not present

## 2021-02-11 DIAGNOSIS — J9811 Atelectasis: Secondary | ICD-10-CM | POA: Diagnosis not present

## 2021-02-11 DIAGNOSIS — R1013 Epigastric pain: Secondary | ICD-10-CM | POA: Diagnosis not present

## 2021-02-11 DIAGNOSIS — J449 Chronic obstructive pulmonary disease, unspecified: Secondary | ICD-10-CM | POA: Diagnosis present

## 2021-02-11 DIAGNOSIS — M8789 Other osteonecrosis, multiple sites: Secondary | ICD-10-CM | POA: Diagnosis not present

## 2021-02-11 DIAGNOSIS — E876 Hypokalemia: Secondary | ICD-10-CM | POA: Diagnosis not present

## 2021-02-11 DIAGNOSIS — Z833 Family history of diabetes mellitus: Secondary | ICD-10-CM

## 2021-02-11 DIAGNOSIS — E871 Hypo-osmolality and hyponatremia: Secondary | ICD-10-CM | POA: Diagnosis not present

## 2021-02-11 DIAGNOSIS — Y929 Unspecified place or not applicable: Secondary | ICD-10-CM

## 2021-02-11 DIAGNOSIS — R42 Dizziness and giddiness: Secondary | ICD-10-CM | POA: Diagnosis not present

## 2021-02-11 DIAGNOSIS — Z6826 Body mass index (BMI) 26.0-26.9, adult: Secondary | ICD-10-CM

## 2021-02-11 DIAGNOSIS — G4700 Insomnia, unspecified: Secondary | ICD-10-CM | POA: Diagnosis present

## 2021-02-11 DIAGNOSIS — Z79890 Hormone replacement therapy (postmenopausal): Secondary | ICD-10-CM

## 2021-02-11 DIAGNOSIS — Z974 Presence of external hearing-aid: Secondary | ICD-10-CM

## 2021-02-11 DIAGNOSIS — Z87440 Personal history of urinary (tract) infections: Secondary | ICD-10-CM

## 2021-02-11 DIAGNOSIS — N261 Atrophy of kidney (terminal): Secondary | ICD-10-CM | POA: Diagnosis not present

## 2021-02-11 DIAGNOSIS — R079 Chest pain, unspecified: Secondary | ICD-10-CM | POA: Diagnosis not present

## 2021-02-11 DIAGNOSIS — R296 Repeated falls: Secondary | ICD-10-CM | POA: Diagnosis present

## 2021-02-11 DIAGNOSIS — R6889 Other general symptoms and signs: Secondary | ICD-10-CM | POA: Diagnosis not present

## 2021-02-11 DIAGNOSIS — I493 Ventricular premature depolarization: Secondary | ICD-10-CM | POA: Diagnosis present

## 2021-02-11 LAB — URINALYSIS, ROUTINE W REFLEX MICROSCOPIC
Bilirubin Urine: NEGATIVE
Glucose, UA: NEGATIVE mg/dL
Hgb urine dipstick: NEGATIVE
Ketones, ur: NEGATIVE mg/dL
Leukocytes,Ua: NEGATIVE
Nitrite: NEGATIVE
Protein, ur: NEGATIVE mg/dL
Specific Gravity, Urine: 1.01 (ref 1.005–1.030)
pH: 6 (ref 5.0–8.0)

## 2021-02-11 LAB — COMPREHENSIVE METABOLIC PANEL
ALT: 17 U/L (ref 0–44)
AST: 24 U/L (ref 15–41)
Albumin: 4 g/dL (ref 3.5–5.0)
Alkaline Phosphatase: 44 U/L (ref 38–126)
Anion gap: 14 (ref 5–15)
BUN: 19 mg/dL (ref 8–23)
CO2: 19 mmol/L — ABNORMAL LOW (ref 22–32)
Calcium: 9.6 mg/dL (ref 8.9–10.3)
Chloride: 88 mmol/L — ABNORMAL LOW (ref 98–111)
Creatinine, Ser: 0.88 mg/dL (ref 0.44–1.00)
GFR, Estimated: >60 mL/min (ref >60)
Glucose, Bld: 122 mg/dL — ABNORMAL HIGH (ref 70–99)
Potassium: 3.3 mmol/L — ABNORMAL LOW (ref 3.5–5.1)
Sodium: 121 mmol/L — ABNORMAL LOW (ref 135–145)
Total Bilirubin: 1 mg/dL (ref 0.3–1.2)
Total Protein: 6.6 g/dL (ref 6.5–8.1)

## 2021-02-11 LAB — CBC WITH DIFFERENTIAL/PLATELET
Abs Immature Granulocytes: 0.05 10*3/uL (ref 0.00–0.07)
Basophils Absolute: 0 10*3/uL (ref 0.0–0.1)
Basophils Relative: 0 %
Eosinophils Absolute: 0 10*3/uL (ref 0.0–0.5)
Eosinophils Relative: 0 %
HCT: 33.6 % — ABNORMAL LOW (ref 36.0–46.0)
Hemoglobin: 11.8 g/dL — ABNORMAL LOW (ref 12.0–15.0)
Immature Granulocytes: 1 %
Lymphocytes Relative: 15 %
Lymphs Abs: 1.4 10*3/uL (ref 0.7–4.0)
MCH: 33.5 pg (ref 26.0–34.0)
MCHC: 35.1 g/dL (ref 30.0–36.0)
MCV: 95.5 fL (ref 80.0–100.0)
Monocytes Absolute: 0.8 10*3/uL (ref 0.1–1.0)
Monocytes Relative: 8 %
Neutro Abs: 7.5 10*3/uL (ref 1.7–7.7)
Neutrophils Relative %: 76 %
Platelets: 221 10*3/uL (ref 150–400)
RBC: 3.52 MIL/uL — ABNORMAL LOW (ref 3.87–5.11)
RDW: 12.1 % (ref 11.5–15.5)
WBC: 9.7 10*3/uL (ref 4.0–10.5)
nRBC: 0 % (ref 0.0–0.2)

## 2021-02-11 LAB — CBG MONITORING, ED: Glucose-Capillary: 126 mg/dL — ABNORMAL HIGH (ref 70–99)

## 2021-02-11 LAB — SARS CORONAVIRUS 2 (TAT 6-24 HRS): SARS Coronavirus 2: NEGATIVE

## 2021-02-11 LAB — MAGNESIUM: Magnesium: 1.9 mg/dL (ref 1.7–2.4)

## 2021-02-11 LAB — T4, FREE: Free T4: 1.67 ng/dL — ABNORMAL HIGH (ref 0.61–1.12)

## 2021-02-11 LAB — TROPONIN I (HIGH SENSITIVITY): Troponin I (High Sensitivity): 9 ng/L (ref <18)

## 2021-02-11 LAB — TSH: TSH: 1.151 u[IU]/mL (ref 0.350–4.500)

## 2021-02-11 LAB — D-DIMER, QUANTITATIVE: D-Dimer, Quant: 1.05 ug/mL-FEU — ABNORMAL HIGH (ref 0.00–0.50)

## 2021-02-11 MED ORDER — POTASSIUM CHLORIDE 10 MEQ/100ML IV SOLN
10.0000 meq | INTRAVENOUS | Status: AC
  Administered 2021-02-11 (×2): 10 meq via INTRAVENOUS
  Filled 2021-02-11 (×2): qty 100

## 2021-02-11 MED ORDER — TRIAMTERENE-HCTZ 37.5-25 MG PO TABS: ORAL_TABLET | ORAL | 3 refills | Status: DC

## 2021-02-11 MED ORDER — CYCLOSPORINE 0.05 % OP EMUL
1.0000 [drp] | Freq: Two times a day (BID) | OPHTHALMIC | Status: DC
  Administered 2021-02-11 – 2021-02-16 (×10): 1 [drp] via OPHTHALMIC
  Filled 2021-02-11 (×11): qty 1

## 2021-02-11 MED ORDER — ACETAMINOPHEN 325 MG PO TABS
650.0000 mg | ORAL_TABLET | ORAL | Status: DC | PRN
  Administered 2021-02-11 – 2021-02-16 (×14): 650 mg via ORAL
  Filled 2021-02-11 (×15): qty 2

## 2021-02-11 MED ORDER — LEVOTHYROXINE SODIUM 100 MCG PO TABS: 100.0000 ug | ORAL_TABLET | ORAL | Status: DC

## 2021-02-11 MED ORDER — ONDANSETRON 4 MG PO TBDP
4.0000 mg | ORAL_TABLET | Freq: Three times a day (TID) | ORAL | Status: DC | PRN
  Administered 2021-02-11: 4 mg via ORAL
  Filled 2021-02-11 (×2): qty 1

## 2021-02-11 MED ORDER — DICLOFENAC SODIUM 1 % EX GEL
2.0000 g | Freq: Two times a day (BID) | CUTANEOUS | Status: DC | PRN
  Administered 2021-02-13 (×2): 2 g via TOPICAL
  Filled 2021-02-11: qty 100

## 2021-02-11 MED ORDER — INSULIN ASPART 100 UNIT/ML ~~LOC~~ SOLN
0.0000 [IU] | Freq: Three times a day (TID) | SUBCUTANEOUS | Status: DC
  Administered 2021-02-14 – 2021-02-15 (×3): 2 [IU] via SUBCUTANEOUS

## 2021-02-11 MED ORDER — LEVOTHYROXINE SODIUM 50 MCG PO TABS: 50.0000 ug | ORAL_TABLET | ORAL | Status: DC

## 2021-02-11 MED ORDER — NITROGLYCERIN 0.4 MG SL SUBL: 0.4000 mg | SUBLINGUAL_TABLET | SUBLINGUAL | Status: DC | PRN

## 2021-02-11 MED ORDER — FLUTICASONE PROPIONATE 50 MCG/ACT NA SUSP
1.0000 | Freq: Every day | NASAL | Status: DC | PRN
  Filled 2021-02-11: qty 16

## 2021-02-11 MED ORDER — SUCRALFATE 1 G PO TABS
1.0000 g | ORAL_TABLET | Freq: Three times a day (TID) | ORAL | Status: DC
  Administered 2021-02-11 – 2021-02-16 (×16): 1 g via ORAL
  Filled 2021-02-11 (×17): qty 1

## 2021-02-11 MED ORDER — POTASSIUM CHLORIDE 10 MEQ/100ML IV SOLN: 10.0000 meq | INTRAVENOUS | Status: DC

## 2021-02-11 MED ORDER — ROSUVASTATIN CALCIUM 5 MG PO TABS: 5.0000 mg | ORAL_TABLET | Freq: Every day | ORAL | Status: DC

## 2021-02-11 MED ORDER — SODIUM CHLORIDE 0.9 % IV BOLUS
1000.0000 mL | Freq: Once | INTRAVENOUS | Status: AC
  Administered 2021-02-11: 1000 mL via INTRAVENOUS

## 2021-02-11 MED ORDER — METHOCARBAMOL 500 MG PO TABS
250.0000 mg | ORAL_TABLET | Freq: Three times a day (TID) | ORAL | Status: DC | PRN
  Administered 2021-02-11 – 2021-02-16 (×3): 250 mg via ORAL
  Filled 2021-02-11 (×4): qty 1

## 2021-02-11 MED ORDER — IOHEXOL 350 MG/ML SOLN
60.0000 mL | Freq: Once | INTRAVENOUS | Status: AC | PRN
  Administered 2021-02-11: 60 mL via INTRAVENOUS

## 2021-02-11 MED ORDER — VITAMIN B-12 1000 MCG PO TABS
1000.0000 ug | ORAL_TABLET | Freq: Every day | ORAL | Status: DC
  Administered 2021-02-12 – 2021-02-16 (×6): 1000 ug via ORAL
  Filled 2021-02-11 (×6): qty 1

## 2021-02-11 MED ORDER — HYPROMELLOSE (GONIOSCOPIC) 2.5 % OP SOLN
1.0000 [drp] | Freq: Every day | OPHTHALMIC | Status: DC
  Administered 2021-02-12 – 2021-02-14 (×2): 1 [drp] via OPHTHALMIC
  Filled 2021-02-11: qty 15

## 2021-02-11 MED ORDER — ROSUVASTATIN CALCIUM 5 MG PO TABS
5.0000 mg | ORAL_TABLET | Freq: Every day | ORAL | Status: DC
  Administered 2021-02-12 – 2021-02-16 (×5): 5 mg via ORAL
  Filled 2021-02-11 (×5): qty 1

## 2021-02-11 MED ORDER — ENOXAPARIN SODIUM 40 MG/0.4ML ~~LOC~~ SOLN
40.0000 mg | SUBCUTANEOUS | Status: DC
  Administered 2021-02-11 – 2021-02-15 (×5): 40 mg via SUBCUTANEOUS
  Filled 2021-02-11 (×5): qty 0.4

## 2021-02-11 MED ORDER — LEVOTHYROXINE SODIUM 50 MCG PO TABS
50.0000 ug | ORAL_TABLET | ORAL | Status: DC
  Administered 2021-02-12: 50 ug via ORAL
  Filled 2021-02-11: qty 1

## 2021-02-11 MED ORDER — SODIUM CHLORIDE 0.9 % IV SOLN: INTRAVENOUS | Status: DC

## 2021-02-11 MED ORDER — TRAMADOL HCL 50 MG PO TABS
50.0000 mg | ORAL_TABLET | Freq: Once | ORAL | Status: AC
  Administered 2021-02-11: 50 mg via ORAL
  Filled 2021-02-11: qty 1

## 2021-02-11 NOTE — Progress Notes (Signed)
Cardiology Office Note:    Date:  02/11/2021   ID:  Christina Sellers, DOB [birth year], MRN [id number]  PCP:  Josetta Huddle, MD  Cardiologist:  Dr Dennison Bulla Electrophysiologist:  None   Referring MD: Josetta Huddle, MD   No chief complaint on file.   History of Present Illness:    Christina Sellers is a pleasant 80 y.o. female with a hx of remote chest pain in 2012.  Myoview in 2019 was low risk.  She has non-insulin-dependent diabetes, hypertension, dyslipidemia, and DJD.  She was seen in the office 216 for preop clearance prior to hip surgery.  She was cleared at that time but then ended up having a syncopal spell 01/27/2021.  She was seen in the emergency room.  She was initially felt to be mildly dehydrated and had a UTI.  She was treated and sent home but then had another near syncopal spell and was readmitted again until February 04, 2021.  The family thinks she was given a diagnosis of "congestive heart failure".  The patient did have a echocardiogram done 02/02/2021 which showed an ejection fraction of 65 to 70% with moderate left atrial enlargement.  She was given this appointment today for further evaluation and preop clearance.  The patient says since discharge she continues to have a mild "weak spells".  She has been taking Maxide full dose for lower extremity edema.  Her heart rate in the office is 60, she is on Toprol 25.  Her blood pressure today is stable 124/78.  She has an appointment tomorrow with her primary care provider who reportedly is scheduled to place a monitor on her.  After I examined the patient in the office I suggested we stop her Toprol as I was somewhat concerned about her heart rate being in the low 60s and her history of syncope.  I also suggested we take her off her Maxide and use it as needed only a couple times a week for lower extremity edema which I suspect is from venous insufficiency and not congestive heart failure.  As we were wrapping up and getting her  paperwork ready the patient became unresponsive.  CODE BLUE was called.  She was laid flat.  She was not responsive and had no blood pressure and faint heart rate.  Brief CPR was started.  She then came around.  After few minutes her blood pressure came up to 123/64 and her heart rate was 60.  Initially her pulse was in the 40s.  She is awake and alert at the moment.  Her EKG shows sinus rhythm with a heart rate of 64 and no acute changes.  She will be transferred to [facility] for further evaluation.  Past Medical History:  Diagnosis Date  . Abnormal Pap smear of vagina   . Allergic sinusitis   . Anxiety   . Atherosclerotic peripheral vascular disease ([place])   . Carotid artery stenosis   . Cellulitis   . COPD with asthma ([place])    not an issue now  . Depression   . Diabetes mellitus type 2, uncomplicated ([place])   . DJD (degenerative joint disease)   . Elevated cholesterol   . Elevated MCV    secondary to alchol  . GERD (gastroesophageal reflux disease)    not an issue now.  . Gout   . Hearing difficulty    bilateral hearing aids  . History of recurrent UTIs   . Hypertension    loss weight'off meds now"  . Hypothyroidism   .  IBS (irritable bowel syndrome)   . Insomnia   . Labial fusion   . Osteoarthritis of left knee   . Osteopenia   . Recurrent vaginitis   . Sjogren's syndrome ([place])   . Syncope 11.7.14   secondary to dehydration and possible hypoglycemia  . Tendonitis   . Thyroid disease   . Transfusion history    age 59 "anemia"    Past Surgical History:  Procedure Laterality Date  . CATARACT EXTRACTION, BILATERAL Bilateral   . CHOLECYSTECTOMY     open  . COLONOSCOPY WITH PROPOFOL N/A 03/23/2016   Procedure: COLONOSCOPY WITH PROPOFOL;  Surgeon: Garlan Fair, MD;  Location: [facility] ENDOSCOPY;  Service: Endoscopy;  Laterality: N/A;  . EYE SURGERY    . FOOT SURGERY Left    retained hardware  . KNEE SURGERY Left    scope   . TONSILLECTOMY      Current  Medications: Current Meds  Medication Sig  . acetaminophen (TYLENOL) 650 MG CR tablet Take 1,300 mg by mouth 2 (two) times daily as needed for pain.  Marland Kitchen amLODipine (NORVASC) 5 MG tablet Take 1 tablet (5 mg total) by mouth daily.  . Ascorbic Acid (VITAMIN C) 500 MG CAPS Take 500 mg by mouth daily.  . Cholecalciferol (VITAMIN D3) 50 MCG (2000 UT) capsule Take 2,000 mg by mouth daily.  . cycloSPORINE (RESTASIS) 0.05 % ophthalmic emulsion Place 1 drop into both eyes 2 (two) times daily.  Marland Kitchen denosumab (PROLIA) 60 MG/ML SOSY injection Inject 60 mg into the skin every 6 (six) months.  . diclofenac sodium (VOLTAREN) 1 % GEL Apply 1 application topically 2 (two) times daily as needed (pain).  . fluticasone (FLONASE) 50 MCG/ACT nasal spray Place 1 spray into both nostrils daily as needed for allergies or rhinitis.  . hypromellose (SYSTANE OVERNIGHT THERAPY) 0.3 % GEL ophthalmic ointment Place 1 application into both eyes at bedtime.  Marland Kitchen levothyroxine (SYNTHROID, LEVOTHROID) 100 MCG tablet Take 50-100 mcg by mouth See admin instructions. Take 1/2 tablet (50 mcg) by mouth Sunday, Tuesday, Thursday, Saturday, take 1 tablet (100 mcg) Monday, Wednesday, Friday  . Menthol, Topical Analgesic, (BIOFREEZE EX) Apply 1 application topically 2 (two) times daily as needed (pain).  . metFORMIN (GLUCOPHAGE) 500 MG tablet Take 500 mg by mouth daily.  . methocarbamol (ROBAXIN) 500 MG tablet Take 250 mg by mouth 3 (three) times daily as needed for muscle spasms (back pain).  . Methylcobalamin 1000 MCG TBDP Take 1,000 mcg by mouth daily.  . metoprolol succinate (TOPROL-XL) 25 MG 24 hr tablet Take 25 mg by mouth at bedtime.  . Multiple Vitamin (MULTIVITAMIN WITH MINERALS) TABS tablet Take 1 tablet by mouth daily.  . ondansetron (ZOFRAN ODT) 4 MG disintegrating tablet Take 1 tablet (4 mg total) by mouth every 8 (eight) hours as needed for nausea or vomiting.  . Psyllium (METAMUCIL PO) Take 1 Scoop by mouth at bedtime as needed  (constipation).  . rosuvastatin (CRESTOR) 5 MG tablet Take 5 mg by mouth daily.  Marland Kitchen triamterene-hydrochlorothiazide (MAXZIDE-25) 37.5-25 MG tablet Take 1 tablet by mouth daily.  . Vitamin E 134 MG (200 UNIT) CAPS Take 400 Units by mouth daily.       Allergies:   Celexa [citalopram hydrobromide], Fluconazole, Green tea leaf ext, Kenalog [triamcinolone], Levaquin [levofloxacin], Losartan potassium-hctz, Methocarbamol, Nitrofurantoin macrocrystal, Scopolamine, Sulfa antibiotics, and Trimethoprim   Social History   Socioeconomic History  . Marital status: Widowed    Spouse name: Not on file  . Number of children:  Not on file  . Years of education: Not on file  . Highest education level: Not on file  Occupational History  . Not on file  Tobacco Use  . Smoking status: Former Smoker    Quit date: 02/21/1982    Years since quitting: 39.0  . Smokeless tobacco: Never Used  Vaping Use  . Vaping Use: Never used  Substance and Sexual Activity  . Alcohol use: No  . Drug use: No  . Sexual activity: Never    Birth control/protection: Post-menopausal  Other Topics Concern  . Not on file  Social History Narrative  . Not on file   Social Determinants of Health   Financial Resource Strain: Not on file  Food Insecurity: Not on file  Transportation Needs: Not on file  Physical Activity: Not on file  Stress: Not on file  Social Connections: Not on file     Family History: The patient's family history includes Breast cancer in her sister; Cancer in her brother and father; Diabetes in her maternal aunt.  ROS:   Please see the history of present illness.     All other systems reviewed and are negative.  EKGs/Labs/Other Studies Reviewed:    The following studies were reviewed today: Echo 2/28/290  EKG:  EKG is ordered today.  The ekg ordered today demonstrates NSR, HR 64, inferior Qs  Recent Labs: 01/21/2021: NT-Pro BNP 551 02/01/2021: B Natriuretic Peptide 139.0 02/02/2021: ALT  12 02/03/2021: Magnesium 1.9 02/04/2021: BUN 15; Creatinine, Ser 0.81; Hemoglobin 11.2; Platelets 198; Potassium 4.4; Sodium 137  Recent Lipid Panel    Component Value Date/Time   CHOL 149 08/26/2011 0400   TRIG 195 (H) 08/26/2011 0400   HDL 37 (L) 08/26/2011 0400   CHOLHDL 4.0 08/26/2011 0400   VLDL 39 08/26/2011 0400   LDLCALC 73 08/26/2011 0400    Physical Exam:    VS:  BP 124/78   Pulse 69   Ht 4\' 11"  (1.499 m)   Wt 125 lb (56.7 kg)   BMI 25.25 kg/m     Wt Readings from Last 3 Encounters:  02/11/21 125 lb (56.7 kg)  02/05/21 128 lb 9.6 oz (58.3 kg)  01/21/21 135 lb (61.2 kg)     GEN: Elderly pale female, well developed in no acute distress HEENT: Normal- ecchymosis Lt orbit NECK: No JVD; No carotid bruits CARDIAC: RRR, no murmurs, rubs, gallops RESPIRATORY:  Clear to auscultation without rales, wheezing or rhonchi -faint crackles Lt base ABDOMEN: Soft, non-tender, non-distended MUSCULOSKELETAL:  No edema; No deformity  SKIN: Warm and dry NEUROLOGIC:  Alert and oriented x 3 PSYCHIATRIC:  Normal affect   ASSESSMENT:    Brief cardiac arrest- Suspect she had hypotension vs transient bradycardia.  Transfer to [facility] for further evaluation.  Preoperative cardiovascular examination Cleared 01/21/2021 but then admitted for syncope 01/27/2021-02/04/2021 Echo 02/02/2021- normal LVF  Type 2 diabetes mellitus with hyperglycemia, without long-term current use of insulin (HCC) PCP follows  History of chest pain Low risk Myoview 2019  PLAN:    Stop Toprol.  Decrease Maxzide to PRN.  Transfer to [facility] for further evaluation.    Medication Adjustments/Labs and Tests Ordered: Current medicines are reviewed at length with the patient today.  Concerns regarding medicines are outlined above.  No orders of the defined types were placed in this encounter.  No orders of the defined types were placed in this encounter.   There are no Patient Instructions on file for this visit.    Signed, Kerin Ransom,  PA-C  02/11/2021 11:25 AM    [facility]

## 2021-02-11 NOTE — Patient Instructions (Signed)
Medication Instructions:  Stop Toprol *If you need a refill on your cardiac medications before your next appointment, please call your pharmacy*   Lab Work: No Labs If you have labs (blood work) drawn today and your tests are completely normal, you will receive your results only by: Marland Kitchen MyChart Message (if you have MyChart) OR . A paper copy in the mail If you have any lab test that is abnormal or we need to change your treatment, we will call you to review the results.   Testing/Procedures: No Testing   Follow-Up: At [facility], you and your health needs are our priority.  As part of our continuing mission to provide you with exceptional heart care, we have created designated Provider Care Teams.  These Care Teams include your primary Cardiologist (physician) and Advanced Practice Providers (APPs -  Physician Assistants and Nurse Practitioners) who all work together to provide you with the care you need, when you need it.  We recommend signing up for the patient portal called "MyChart".  Sign up information is provided on this After Visit Summary.  MyChart is used to connect with patients for Virtual Visits (Telemedicine).  Patients are able to view lab/test results, encounter notes, upcoming appointments, etc.  Non-urgent messages can be sent to your provider as well.   To learn more about what you can do with MyChart, go to [email or web address].    Your next appointment:     The format for your next appointment:     Provider:

## 2021-02-11 NOTE — Assessment & Plan Note (Signed)
Low risk Myoview 2019

## 2021-02-11 NOTE — Progress Notes (Signed)
Received s/o from Sande Rives to f/u CTA report to review for PE. CTA nonacute, no PE, + mild bilateral lower lobe atx, aortic atherosclerosis without other acute findings. IM on board to assist with electrolyte abnormalities.

## 2021-02-11 NOTE — ED Notes (Signed)
I didn't have any success getting patient blood. 

## 2021-02-11 NOTE — ED Provider Notes (Signed)
[facility] EMERGENCY DEPARTMENT Provider Note   CSN: [id number] Arrival date & time: 02/11/21  1224     History Chief Complaint  Patient presents with  . Loss of Consciousness    Christina Sellers is a 80 y.o. female.  Presenting to the emergency room with concern for syncope.  Patient has had 3 syncopal episodes over the past week.  After second when she was admitted to the hospital.  The third episode occurred this afternoon.  She was at cardiology clinic for follow-up appointment when patient passed out while sitting on the exam table.  Patient was unresponsive, concern for possible cardiac arrest and she received around 10 chest compressions.  Patient remembers people pushing on her chest.  Patient was not on monitor when this occurred.  Patient denies any head trauma.  She was laid flat slowly.  She denies any chest pain, abdominal pain, back pain at this time.  Completed chart review, cardiology recommending stopping Toprol, decreasing Maxzide and transferring to [facility].  HPI     Past Medical History:  Diagnosis Date  . Abnormal Pap smear of vagina   . Allergic sinusitis   . Anxiety   . Atherosclerotic peripheral vascular disease ([place])   . Carotid artery stenosis   . Cellulitis   . COPD with asthma ([place])    not an issue now  . Depression   . Diabetes mellitus type 2, uncomplicated ([place])   . DJD (degenerative joint disease)   . Elevated cholesterol   . Elevated MCV    secondary to alchol  . GERD (gastroesophageal reflux disease)    not an issue now.  . Gout   . Hearing difficulty    bilateral hearing aids  . History of recurrent UTIs   . Hypertension    loss weight'off meds now"  . Hypothyroidism   . IBS (irritable bowel syndrome)   . Insomnia   . Labial fusion   . Osteoarthritis of left knee   . Osteopenia   . Recurrent vaginitis   . Sjogren's syndrome ([place])   . Syncope 11.7.14   secondary to dehydration and possible  hypoglycemia  . Tendonitis   . Thyroid disease   . Transfusion history    age 74 "anemia"    Patient Active Problem List   Diagnosis Date Noted  . History of chest pain 02/11/2021  . Acute cystitis without hematuria   . Chronic back pain   . Syncope 02/01/2021  . E-coli UTI 02/01/2021  . Dehydration with hyponatremia 02/01/2021  . Nausea and vomiting 02/01/2021  . Acute diarrhea 02/01/2021  . Chronic diastolic CHF (congestive heart failure) ([place]) 02/01/2021  . Metabolic acidosis 19/14/7829  . Preoperative cardiovascular examination 01/21/2021  . Bilateral edema of lower extremity 01/21/2021  . Hypertension associated with diabetes ([place]) 01/21/2021  . Mixed diabetic hyperlipidemia associated with type 2 diabetes mellitus ([place]) 01/21/2021  . Aortic atherosclerosis ([place]) 01/21/2021  . Chronic low back pain 10/05/2019  . Type 2 diabetes mellitus with hyperglycemia, without long-term current use of insulin ([place]) 02/21/2019  . Hypothyroidism 02/21/2019  . Pain of left hip joint 12/08/2018  . Dermatochalasis of both eyelids 07/04/2018  . Viral labyrinthitis 01/13/2017  . Atherosclerotic peripheral vascular disease ([place])   . Essential hypertension, benign 11/22/2013  . Osteopenia 11/22/2013  . Labial fusion     Past Surgical History:  Procedure Laterality Date  . CATARACT EXTRACTION, BILATERAL Bilateral   . CHOLECYSTECTOMY     open  .  COLONOSCOPY WITH PROPOFOL N/A 03/23/2016   Procedure: COLONOSCOPY WITH PROPOFOL;  Surgeon: Garlan Fair, MD;  Location: [facility] ENDOSCOPY;  Service: Endoscopy;  Laterality: N/A;  . EYE SURGERY    . FOOT SURGERY Left    retained hardware  . KNEE SURGERY Left    scope   . TONSILLECTOMY       OB History    Gravida  2   Para  2   Term  2   Preterm      AB      Living  3     SAB      IAB      Ectopic      Multiple  1   Live Births              Family History  Problem Relation Age of Onset  . Diabetes Maternal Aunt   .  Cancer Father        Oral cancer  . Breast cancer Sister        Age 8's  . Cancer Brother        Lung cancer    Social History   Tobacco Use  . Smoking status: Former Smoker    Quit date: 02/21/1982    Years since quitting: 39.0  . Smokeless tobacco: Never Used  Vaping Use  . Vaping Use: Never used  Substance Use Topics  . Alcohol use: No  . Drug use: No    Home Medications Prior to Admission medications   Medication Sig Start Date End Date Taking? Authorizing Provider  acetaminophen (TYLENOL) 650 MG CR tablet Take 1,300 mg by mouth 2 (two) times daily as needed for pain.    [provider]  amLODipine (NORVASC) 5 MG tablet Take 1 tablet (5 mg total) by mouth daily. 02/05/21   Domenic Polite, MD  Ascorbic Acid (VITAMIN C) 500 MG CAPS Take 500 mg by mouth daily.    [provider]  Cholecalciferol (VITAMIN D3) 50 MCG (2000 UT) capsule Take 2,000 mg by mouth daily.    [provider]  cycloSPORINE (RESTASIS) 0.05 % ophthalmic emulsion Place 1 drop into both eyes 2 (two) times daily.    [provider]  denosumab (PROLIA) 60 MG/ML SOSY injection Inject 60 mg into the skin every 6 (six) months.    [provider]  diclofenac sodium (VOLTAREN) 1 % GEL Apply 1 application topically 2 (two) times daily as needed (pain). 03/29/19   [provider]  fluticasone (FLONASE) 50 MCG/ACT nasal spray Place 1 spray into both nostrils daily as needed for allergies or rhinitis.    [provider]  hypromellose (SYSTANE OVERNIGHT THERAPY) 0.3 % GEL ophthalmic ointment Place 1 application into both eyes at bedtime.    [provider]  levothyroxine (SYNTHROID, LEVOTHROID) 100 MCG tablet Take 50-100 mcg by mouth See admin instructions. Take 1/2 tablet (50 mcg) by mouth Sunday, Tuesday, Thursday, Saturday, take 1 tablet (100 mcg) Monday, Wednesday, Friday 10/23/14   [provider]  Menthol, Topical Analgesic, (BIOFREEZE EX)  Apply 1 application topically 2 (two) times daily as needed (pain).    [provider]  metFORMIN (GLUCOPHAGE) 500 MG tablet Take 500 mg by mouth daily. 02/20/19   [provider]  methocarbamol (ROBAXIN) 500 MG tablet Take 250 mg by mouth 3 (three) times daily as needed for muscle spasms (back pain).    [provider]  Methylcobalamin 1000 MCG TBDP Take 1,000 mcg by mouth daily.  [provider]  metoprolol succinate (TOPROL-XL) 25 MG 24 hr tablet Take 25 mg by mouth at bedtime.    [provider]  ondansetron (ZOFRAN ODT) 4 MG disintegrating tablet Take 1 tablet (4 mg total) by mouth every 8 (eight) hours as needed for nausea or vomiting. 01/27/21   Truddie Hidden, MD  Psyllium (METAMUCIL PO) Take 1 Scoop by mouth at bedtime as needed (constipation).    [provider]  rosuvastatin (CRESTOR) 5 MG tablet Take 5 mg by mouth daily.    [provider]  triamterene-hydrochlorothiazide (MAXZIDE-25) 37.5-25 MG tablet Take 1 Tablet (3) Three Time a Week as Needed 02/11/21   Erlene Quan, PA-C  Vitamin E 134 MG (200 UNIT) CAPS Take 400 Units by mouth daily.      [provider]    Allergies    Celexa [citalopram hydrobromide], Fluconazole, Green tea leaf ext, Kenalog [triamcinolone], Levaquin [levofloxacin], Losartan potassium-hctz, Methocarbamol, Nitrofurantoin macrocrystal, Scopolamine, Sulfa antibiotics, and Trimethoprim  Review of Systems   Review of Systems  Constitutional: Negative for chills and fever.  HENT: Negative for ear pain and sore throat.   Eyes: Negative for pain and visual disturbance.  Respiratory: Negative for cough and shortness of breath.   Cardiovascular: Negative for chest pain and palpitations.  Gastrointestinal: Negative for abdominal pain and vomiting.  Genitourinary: Negative for dysuria and hematuria.  Musculoskeletal: Negative for arthralgias and back pain.  Skin: Negative for color change  and rash.  Neurological: Positive for syncope. Negative for seizures.  All other systems reviewed and are negative.   Physical Exam Updated Vital Signs BP (!) 153/49   Pulse 80   Resp 12   SpO2 97%   Physical Exam Vitals and nursing note reviewed.  Constitutional:      General: She is not in acute distress.    Appearance: She is well-developed and well-nourished.  HENT:     Head: Normocephalic and atraumatic.  Eyes:     Conjunctiva/sclera: Conjunctivae normal.  Cardiovascular:     Rate and Rhythm: Normal rate and regular rhythm.     Heart sounds: No murmur heard.   Pulmonary:     Effort: Pulmonary effort is normal. No respiratory distress.     Breath sounds: Normal breath sounds.  Abdominal:     Palpations: Abdomen is soft.     Tenderness: There is no abdominal tenderness.  Musculoskeletal:        General: No deformity, signs of injury or edema.     Cervical back: Neck supple.  Skin:    General: Skin is warm and dry.  Neurological:     General: No focal deficit present.     Mental Status: She is alert.  Psychiatric:        Mood and Affect: Mood and affect and mood normal.        Behavior: Behavior normal.     ED Results / Procedures / Treatments   Labs (all labs ordered are listed, but only abnormal results are displayed) Labs Reviewed  SARS CORONAVIRUS 2 (TAT 6-24 HRS)  CBC WITH DIFFERENTIAL/PLATELET  MAGNESIUM  TSH  T4, FREE  COMPREHENSIVE METABOLIC PANEL  D-DIMER, QUANTITATIVE  TROPONIN I (HIGH SENSITIVITY)  TROPONIN I (HIGH SENSITIVITY)    EKG EKG Interpretation  Date/Time:  Wednesday February 11 2021 12:36:12 EST Ventricular Rate:  78 PR Interval:    QRS Duration: 104 QT Interval:  450 QTC Calculation: 513 R Axis:   2 Text Interpretation: Sinus rhythm Consider right  atrial enlargement Borderline T abnormalities, diffuse leads Prolonged QT interval Artifact in lead(s) I II III aVR aVL aVF V2 Confirmed by Madalyn Rob [phone]) on 02/11/2021  2:03:15 PM   Radiology DG Chest Portable 1 View  Result Date: 02/11/2021 CLINICAL DATA:  Chest pain. EXAM: PORTABLE CHEST 1 VIEW COMPARISON:  Chest x-ray 05/09/2018. FINDINGS: Mediastinum and hilar structures normal. Heart size normal. No focal infiltrate. Low lung volumes with mild bibasilar atelectasis. No pleural effusion or pneumothorax. Surgical clips right upper quadrant. IMPRESSION: No acute cardiopulmonary disease. Low lung volumes with mild bibasilar atelectasis. Electronically Signed   By: Marcello Moores  Register   On: 02/11/2021 15:15    Procedures Procedures   Medications Ordered in ED Medications  0.9 %  sodium chloride infusion (has no administration in time range)  sodium chloride 0.9 % bolus 1,000 mL (has no administration in time range)  ondansetron (ZOFRAN-ODT) disintegrating tablet 4 mg (has no administration in time range)  traMADol (ULTRAM) tablet 50 mg (50 mg Oral Given 02/11/21 1341)    ED Course  I have reviewed the triage vital signs and the nursing notes.  Pertinent labs & imaging results that were available during my care of the patient were reviewed by me and considered in my medical decision making (see chart for details).    MDM Rules/Calculators/A&P                           80 year old lady presents to ER after having multiple syncopal episodes, most recently in the cardiology clinic today.  She received a few chest compressions although it is not clear if she had a true cardiac arrest versus syncope.  Unfortunately no documentation or report of monitoring at time of this past episode.  Placed orders for basic labs and asked cardiology to evaluate patient.  Anticipate she will need admission for further observation.  Cardiology placed orders to admit patient.  Final Clinical Impression(s) / ED Diagnoses Final diagnoses:  Syncope, unspecified syncope type    Rx / DC Orders ED Discharge Orders    None       Lucrezia Starch, MD 02/11/21 1538

## 2021-02-11 NOTE — H&P (Signed)
Cardiology Admission History and Physical:   Patient ID: Christina Sellers MRN: [id number]; DOB: 14-Mar-1941   Admission date: 02/11/2021  PCP:  Josetta Huddle, MD   Christina Sellers  Cardiologist:  No primary care provider on file.  Advanced Practice Provider:  No care team member to display Electrophysiologist:  None     Chief Complaint: Syncope  Patient Profile:   Christina Sellers is a 80 y.o. female with syncope in clinic today while seeing Christina Sellers  History of Present Illness:   Christina Sellers 80 year old female with mild mitral regurgitation, diabetes, who was transported to the ER via EMS after seeing Christina Sellers today in clinic at [facility] line heart care office.  After the visit she told her daughter Christina Sellers who is with her from [place] that she was starting to feel poor.  She asked for some smelling salts.  They were able to put the bed back for her to lay down and her eyes went back and she lost consciousness.  Briefly she underwent chest compressions, came to.  Felt to be pulseless initially.  She was diaphoretic.  She did feel some nausea.  She has had 3 episodes now in the past 2 weeks of fainting like this.  The middle episode occurred where she was sitting on the toilet and fell forward, caused left eye ecchymosis. She was hospitalized and discharged on 02/04/2021 by Dr. Broadus John.  At that time she was treated for a E. coli UTI.  She was also noted to have uncontrolled hypertension and triamterene hydrochlorothiazide was started back as well as amlodipine and Toprol.  When she initially came into the hospital she was hyponatremic.  Echocardiogram 65% with mild mitral gravitation.  Murmur heard on exam.  She was aggressively fluid resuscitated.  ACS negative.  Morbidities of hypothyroidism hypertension depression irritable bowel syndrome peripheral vascular disease gout hyperlipidemia COPD with asthma diabetes type 2 diastolic heart failure.  Previously had some  nonbloody diarrhea on her prior admit.  MRI brain unremarkable.  She is awaiting hip surgery.  Has extensive hip pain.    Past Medical History:  Diagnosis Date  . Abnormal Pap smear of vagina   . Allergic sinusitis   . Anxiety   . Atherosclerotic peripheral vascular disease ([place])   . Carotid artery stenosis   . Cellulitis   . COPD with asthma ([place])    not an issue now  . Depression   . Diabetes mellitus type 2, uncomplicated ([place])   . DJD (degenerative joint disease)   . Elevated cholesterol   . Elevated MCV    secondary to alchol  . GERD (gastroesophageal reflux disease)    not an issue now.  . Gout   . Hearing difficulty    bilateral hearing aids  . History of recurrent UTIs   . Hypertension    loss weight'off meds now"  . Hypothyroidism   . IBS (irritable bowel syndrome)   . Insomnia   . Labial fusion   . Osteoarthritis of left knee   . Osteopenia   . Recurrent vaginitis   . Sjogren's syndrome ([place])   . Syncope 11.7.14   secondary to dehydration and possible hypoglycemia  . Tendonitis   . Thyroid disease   . Transfusion history    age 43 "anemia"    Past Surgical History:  Procedure Laterality Date  . CATARACT EXTRACTION, BILATERAL Bilateral   . CHOLECYSTECTOMY     open  . COLONOSCOPY WITH PROPOFOL N/A 03/23/2016   Procedure:  COLONOSCOPY WITH PROPOFOL;  Surgeon: Garlan Fair, MD;  Location: [facility] ENDOSCOPY;  Service: Endoscopy;  Laterality: N/A;  . EYE SURGERY    . FOOT SURGERY Left    retained hardware  . KNEE SURGERY Left    scope   . TONSILLECTOMY       Medications Prior to Admission: Prior to Admission medications   Medication Sig Start Date End Date Taking? Authorizing Provider  acetaminophen (TYLENOL) 650 MG CR tablet Take 1,300 mg by mouth 2 (two) times daily as needed for pain.    [provider]  amLODipine (NORVASC) 5 MG tablet Take 1 tablet (5 mg total) by mouth daily. 02/05/21   Domenic Polite, MD  Ascorbic Acid (VITAMIN C) 500  MG CAPS Take 500 mg by mouth daily.    [provider]  Cholecalciferol (VITAMIN D3) 50 MCG (2000 UT) capsule Take 2,000 mg by mouth daily.    [provider]  cycloSPORINE (RESTASIS) 0.05 % ophthalmic emulsion Place 1 drop into both eyes 2 (two) times daily.    [provider]  denosumab (PROLIA) 60 MG/ML SOSY injection Inject 60 mg into the skin every 6 (six) months.    [provider]  diclofenac sodium (VOLTAREN) 1 % GEL Apply 1 application topically 2 (two) times daily as needed (pain). 03/29/19   [provider]  fluticasone (FLONASE) 50 MCG/ACT nasal spray Place 1 spray into both nostrils daily as needed for allergies or rhinitis.    [provider]  hypromellose (SYSTANE OVERNIGHT THERAPY) 0.3 % GEL ophthalmic ointment Place 1 application into both eyes at bedtime.    [provider]  levothyroxine (SYNTHROID, LEVOTHROID) 100 MCG tablet Take 50-100 mcg by mouth See admin instructions. Take 1/2 tablet (50 mcg) by mouth Sunday, Tuesday, Thursday, Saturday, take 1 tablet (100 mcg) Monday, Wednesday, Friday 10/23/14   [provider]  Menthol, Topical Analgesic, (BIOFREEZE EX) Apply 1 application topically 2 (two) times daily as needed (pain).    [provider]  metFORMIN (GLUCOPHAGE) 500 MG tablet Take 500 mg by mouth daily. 02/20/19   [provider]  methocarbamol (ROBAXIN) 500 MG tablet Take 250 mg by mouth 3 (three) times daily as needed for muscle spasms (back pain).    [provider]  Methylcobalamin 1000 MCG TBDP Take 1,000 mcg by mouth daily.    [provider]  metoprolol succinate (TOPROL-XL) 25 MG 24 hr tablet Take 25 mg by mouth at bedtime.    [provider]  ondansetron (ZOFRAN ODT) 4 MG disintegrating tablet Take 1 tablet (4 mg total) by mouth every 8 (eight) hours as needed for nausea or vomiting. 01/27/21   Truddie Hidden, MD  Psyllium (METAMUCIL PO) Take 1 Scoop  by mouth at bedtime as needed (constipation).    [provider]  rosuvastatin (CRESTOR) 5 MG tablet Take 5 mg by mouth daily.    [provider]  triamterene-hydrochlorothiazide (MAXZIDE-25) 37.5-25 MG tablet Take 1 Tablet (3) Three Time a Week as Needed 02/11/21   Erlene Quan, PA-C  Vitamin E 134 MG (200 UNIT) CAPS Take 400 Units by mouth daily.      [provider]     Allergies:    Allergies  Allergen Reactions  . Celexa [Citalopram Hydrobromide] Other (See Comments)    nightmares  . Fluconazole Other (See Comments)    Unknown reaction  . Green Tea Leaf Ext Other (See Comments)    Mouth ulcers  . Kenalog [Triamcinolone]  Other (See Comments)    Spinal injection caused sweats  . Levaquin [Levofloxacin] Hives  . Losartan Potassium-Hctz Other (See Comments)    Night sweats/ fatigue  . Methocarbamol Other (See Comments)    500 mg is too much  . Nitrofurantoin Macrocrystal Other (See Comments)    Possible vertigo  . Scopolamine Other (See Comments)    Memory change  . Sulfa Antibiotics Other (See Comments)    Unknown reaction  . Trimethoprim Hives    Social History:   Social History   Socioeconomic History  . Marital status: Widowed    Spouse name: Not on file  . Number of children: Not on file  . Years of education: Not on file  . Highest education level: Not on file  Occupational History  . Not on file  Tobacco Use  . Smoking status: Former Smoker    Quit date: 02/21/1982    Years since quitting: 39.0  . Smokeless tobacco: Never Used  Vaping Use  . Vaping Use: Never used  Substance and Sexual Activity  . Alcohol use: No  . Drug use: No  . Sexual activity: Never    Birth control/protection: Post-menopausal  Other Topics Concern  . Not on file  Social History Narrative  . Not on file   Social Determinants of Health   Financial Resource Strain: Not on file  Food Insecurity: Not on file  Transportation Needs: Not on file  Physical  Activity: Not on file  Stress: Not on file  Social Connections: Not on file  Intimate Partner Violence: Not on file    Family History:   The patient's family history includes Breast cancer in her sister; Cancer in her brother and father; Diabetes in her maternal aunt.    ROS:  Please see the history of present illness.  Denies any fevers.  She has had some chills recently has felt cold.  No significant shortness of breath.  No chest pain.  All other ROS reviewed and negative.     Physical Exam/Data:   Vitals:   02/11/21 1310 02/11/21 1315 02/11/21 1345 02/11/21 1415  BP:  (!) 148/50 (!) 158/91 (!) 153/49  Pulse: 72 70 81 80  Resp: 18 13 17 12   SpO2: 99% 98% 98% 97%   No intake or output data in the 24 hours ending 02/11/21 1424 Last 3 Weights 02/11/2021 02/05/2021 02/04/2021  Weight (lbs) 125 lb 128 lb 9.6 oz 130 lb 6.4 oz  Weight (kg) 56.7 kg 58.333 kg 59.149 kg     There is no height or weight on file to calculate BMI.  General: Pale, mildly ill-appearing HEENT: Left eye ecchymosis from prior fall Lymph: no adenopathy Neck: no JVD Endocrine:  No thryomegaly Vascular: No carotid bruits; FA pulses 2+ bilaterally without bruits  Cardiac:  normal S1, S2; RRR; 2/6 holosystolic murmur Lungs:  clear to auscultation bilaterally, no wheezing, rhonchi or rales  Abd: soft, nontender, no hepatomegaly  Ext: Trace lower extremity edema.  Right lateral tibial ecchymosis Musculoskeletal:  No deformities, BUE and BLE strength normal and equal Skin: warm and dry  Neuro:  CNs 2-12 intact, no focal abnormalities noted Psych:  Normal affect    EKG:  The ECG that was done  was personally reviewed and demonstrates sinus rhythm, borderline prolonged QT.  Nonspecific ST-T wave changes  Relevant CV Studies:  Echocardiogram 02/02/2021:  1. Left ventricular ejection fraction, by estimation, is 65 to 70%. The  left ventricle has normal function. The left ventricle has  no regional  wall motion  abnormalities. Left ventricular diastolic parameters are  consistent with Grade I diastolic  dysfunction (impaired relaxation).  2. Right ventricular systolic function is normal. The right ventricular  size is mildly enlarged. There is normal pulmonary artery systolic  pressure.  3. Left atrial size was mild to moderately dilated.  4. The mitral valve is normal in structure. Mild mitral valve  regurgitation. No evidence of mitral stenosis. Moderate mitral annular  calcification.  5. Tricuspid valve regurgitation is mild to moderate.  6. The aortic valve is normal in structure. Aortic valve regurgitation is  not visualized. No aortic stenosis is present.  7. The inferior vena cava is normal in size with greater than 50%  respiratory variability, suggesting right atrial pressure of 3 mmHg.   Laboratory Data:  High Sensitivity Troponin:   Recent Labs  Lab 02/01/21 2200  TROPONINIHS 10      ChemistryNo results for input(s): NA, K, CL, CO2, GLUCOSE, BUN, CREATININE, CALCIUM, GFRNONAA, GFRAA, ANIONGAP in the last 168 hours.  No results for input(s): PROT, ALBUMIN, AST, ALT, ALKPHOS, BILITOT in the last 168 hours. HematologyNo results for input(s): WBC, RBC, HGB, HCT, MCV, MCH, MCHC, RDW, PLT in the last 168 hours. BNPNo results for input(s): BNP, PROBNP in the last 168 hours.  DDimer No results for input(s): DDIMER in the last 168 hours.   Radiology/Studies:  No results found.   Assessment and Plan:   Syncope/PEA arrest -She has had 3 separate episodes of the past 2 weeks.  In fact she was here in the hospital late February after 1 of these episodes.  She was found to be hypovolemic, hyponatremic at that time. -She does have a prodrome, stated that she felt bad, requested smelling salts.  Also had some sweating, nausea associated today.  Could be possibly vagally mediated as well. -Given the recurrence, I will check a D-dimer as pulmonary emboli sometimes can present in this  fashion.  If abnormal, we will pursue CT angiogram of chest to exclude PE. -I will also check TSH and free T4 since it has been several years.  She does state that she has had chills at home. -I will check an a.m. cortisol.  Question adrenal insufficiency. -We will hold off of Maxide which is triamterene/hydrochlorothiazide and Toprol. -Hold antihypertensives at this time.  I understand that she has had elevated blood pressures.  I am willing to tolerate given these wild swings in blood pressures noted.  Hyponatremia -Serum sodium previously was 126 on 02/01/2021.  Potassium was low at 3.4.  Once again we are stopping the Maxide.  Left eye ecchymosis -From prior fall when she was on the commode and fainted.  Once again could there be a possible vagal mediated response.  Head CT was unremarkable.  During last admission was unremarkable.    Risk Assessment/Risk Scores:      Severity of Illness: The appropriate patient status for this patient is OBSERVATION. Observation status is judged to be reasonable and necessary in order to provide the required intensity of service to ensure the patient's safety. The patient's presenting symptoms, physical exam findings, and initial radiographic and laboratory data in the context of their medical condition is felt to place them at decreased risk for further clinical deterioration. Furthermore, it is anticipated that the patient will be medically stable for discharge from the hospital within 2 midnights of admission. The following factors support the patient status of observation.   " The patient's presenting symptoms include  syncope. " The physical exam findings include left eye ecchymosis. " The initial radiographic and laboratory data are normal rhythm.     For questions or updates, please contact [place] Please consult [email or web address] for contact info under     Signed, Candee Furbish, MD  02/11/2021 2:24 PM

## 2021-02-11 NOTE — ED Triage Notes (Signed)
BIB [facility] after pt went to [facility] for check up and had syncopal episode in front of staff. Pt was given 10 chest compression on scene. Pt c/o of weakness/ syncope over the course of the last week. Pt feel last week and sustained lacs.

## 2021-02-11 NOTE — Consult Note (Addendum)
[facility] Medical Consultation  Christina Sellers JEH:[id number] DOB: 04/12/1941 DOA: 02/11/2021 PCP: Christina Huddle, MD   Requesting physician: Christina Furbish, MD Date of consultation: 02/11/21 Reason for consultation: Nausea  Impression/Recommendations Active Problems:   Syncope    1. Syncope/PEA arrest: Patient with presents after third episode of syncope in the last 3 weeks.  Temporarily given CPR prior to regaining consciousness.  D-dimer was elevated at 1.05 and a CT angiogram of the chest was ordered. -Follow-up TSH, free T4, and a.m. cortisol -Per cardiology  2. Nausea History VZC:HYIFO. Patient with history of IBS. During previous to episodes of syncope patient had reportedly had nausea, vomiting, and diarrhea.  However, she only reports having some nausea after her episode today.  Nausea symptoms have improved since receiving Zofran in the ED.  EKG was noted to to show a possible prolonged QT interval.   -Strict I&Os -Will order Carafate 1 g 3 times daily with meals -Consider Tigan IV as needed for nausea/ vomiting as it does not prolong QT interval -Consider check stool studies if diarrhea appreciated -Referral to gastroenterology would likely be beneficial in outpatient setting no other clear cause of the patient's symptoms can be found  3. Hyponatremia and hypokalemia: Acute.  On admission sodium was noted to be 121 with potassium 3.3.  Patient has been started on IV fluids. -Replaced to give potassium chloride 20 mEq IV -Goal correction of sodium no more than 10 mmol/L in a 24-hour period -IV fluids per cardiology  4. Prolonged QT interval: Acute.  Reported to be 513.  5. Essential hypertension: Blood pressures currently stable.  Cardiology held metoprolol, Maxide, and amlodipine  6. Diabetes mellitus type 2: Glucose was 122 on admission. Home medications include Metformin.  Last hemoglobin A1c 5.8 on 02/01/2021.    7. Hypothyroidism -Follow-up TSH and free  T4 -Continue levothyroxine  8. Hyperlipidemia -Continue Crestor      I will followup again tomorrow. Please contact me if I can be of assistance in the meanwhile. Thank you for this consultation.  Chief Complaint: Syncope  HPI:  Christina Sellers is a 80 y.o. female with medical history significant of hypertension, hyperlipidemia, diastolic CHF, diabetes mellitus type II, PVD, COPD, asthma, and IBS who presented after having loss of consciousness while being evaluated at the cardiology office today.  Patient reportedly had a very faint pulse and CPR was started temporarily before patient regained consciousness.  She was noted to be diaphoretic with complaints of some nausea.  Denied having any vomiting or diarrhea prior to this episode.  This is the patient's third syncopal episode however in the last 2 weeks.  She had fallen off the toilet sustaining a bruise of her left eye on 2/22 seen in the emergency department diagnosed with a gastroenteritis.  Patient was subsequently hospitalized from 2/27-3/2 after having syncopal episode found to have E. coli UTI for which she was treated with a 4-day course of Keflex.  Nausea, vomiting, and diarrhea were thought to be secondary to the UTI and recent antibiotic treatments.    Labs significant for sodium 121, potassium 3.3, chloride 88, BUN 19, and creatinine 0.88, and D-dimer 1.05.  Chest x-ray was significant for low lung volumes without any acute abnormality.  Review of Systems  Cardiovascular: Negative for chest pain.  Gastrointestinal: Positive for nausea. Negative for abdominal pain and vomiting.  Neurological: Positive for loss of consciousness.     Past Medical History:  Diagnosis Date  . Abnormal Pap smear of vagina   .  Allergic sinusitis   . Anxiety   . Atherosclerotic peripheral vascular disease ([place])   . Carotid artery stenosis   . Cellulitis   . COPD with asthma ([place])    not an issue now  . Depression   . Diabetes mellitus  type 2, uncomplicated ([place])   . DJD (degenerative joint disease)   . Elevated cholesterol   . Elevated MCV    secondary to alchol  . GERD (gastroesophageal reflux disease)    not an issue now.  . Gout   . Hearing difficulty    bilateral hearing aids  . History of recurrent UTIs   . Hypertension    loss weight'off meds now"  . Hypothyroidism   . IBS (irritable bowel syndrome)   . Insomnia   . Labial fusion   . Osteoarthritis of left knee   . Osteopenia   . Recurrent vaginitis   . Sjogren's syndrome ([place])   . Syncope 11.7.14   secondary to dehydration and possible hypoglycemia  . Tendonitis   . Thyroid disease   . Transfusion history    age 14 "anemia"   Past Surgical History:  Procedure Laterality Date  . CATARACT EXTRACTION, BILATERAL Bilateral   . CHOLECYSTECTOMY     open  . COLONOSCOPY WITH PROPOFOL N/A 03/23/2016   Procedure: COLONOSCOPY WITH PROPOFOL;  Surgeon: Christina Fair, MD;  Location: [facility];  Service: Endoscopy;  Laterality: N/A;  . EYE SURGERY    . FOOT SURGERY Left    retained hardware  . KNEE SURGERY Left    scope   . TONSILLECTOMY     Social History:  reports that she quit smoking about 39 years ago. She has never used smokeless tobacco. She reports that she does not drink alcohol and does not use drugs.  Allergies  Allergen Reactions  . Celexa [Citalopram Hydrobromide] Other (See Comments)    nightmares  . Fluconazole Other (See Comments)    Unknown reaction  . Green Tea Leaf Ext Other (See Comments)    Mouth ulcers  . Kenalog [Triamcinolone] Other (See Comments)    Spinal injection caused sweats  . Levaquin [Levofloxacin] Hives  . Losartan Potassium-Hctz Other (See Comments)    Night sweats/ fatigue  . Methocarbamol Other (See Comments)    500 mg is too much  . Nitrofurantoin Macrocrystal Other (See Comments)    Possible vertigo  . Scopolamine Other (See Comments)    Memory change  . Sulfa Antibiotics Other (See Comments)     Unknown reaction  . Trimethoprim Hives   Family History  Problem Relation Age of Onset  . Diabetes Maternal Aunt   . Cancer Father        Oral cancer  . Breast cancer Sister        Age 14's  . Cancer Brother        Lung cancer    Prior to Admission medications   Medication Sig Start Date End Date Taking? Authorizing Provider  acetaminophen (TYLENOL) 650 MG CR tablet Take 1,300 mg by mouth 2 (two) times daily as needed for pain.    [provider]  amLODipine (NORVASC) 5 MG tablet Take 1 tablet (5 mg total) by mouth daily. 02/05/21   Domenic Polite, MD  Ascorbic Acid (VITAMIN C) 500 MG CAPS Take 500 mg by mouth daily.    [provider]  Cholecalciferol (VITAMIN D3) 50 MCG (2000 UT) capsule Take 2,000 mg by mouth daily.    [provider]  cycloSPORINE (  RESTASIS) 0.05 % ophthalmic emulsion Place 1 drop into both eyes 2 (two) times daily.    [provider]  denosumab (PROLIA) 60 MG/ML SOSY injection Inject 60 mg into the skin every 6 (six) months.    [provider]  diclofenac sodium (VOLTAREN) 1 % GEL Apply 1 application topically 2 (two) times daily as needed (pain). 03/29/19   [provider]  fluticasone (FLONASE) 50 MCG/ACT nasal spray Place 1 spray into both nostrils daily as needed for allergies or rhinitis.    [provider]  hypromellose (SYSTANE OVERNIGHT THERAPY) 0.3 % GEL ophthalmic ointment Place 1 application into both eyes at bedtime.    [provider]  levothyroxine (SYNTHROID, LEVOTHROID) 100 MCG tablet Take 50-100 mcg by mouth See admin instructions. Take 1/2 tablet (50 mcg) by mouth Sunday, Tuesday, Thursday, Saturday, take 1 tablet (100 mcg) Monday, Wednesday, Friday 10/23/14   [provider]  Menthol, Topical Analgesic, (BIOFREEZE EX) Apply 1 application topically 2 (two) times daily as needed (pain).    [provider]  metFORMIN (GLUCOPHAGE) 500 MG tablet Take 500 mg by mouth  daily. 02/20/19   [provider]  methocarbamol (ROBAXIN) 500 MG tablet Take 250 mg by mouth 3 (three) times daily as needed for muscle spasms (back pain).    [provider]  Methylcobalamin 1000 MCG TBDP Take 1,000 mcg by mouth daily.    [provider]  metoprolol succinate (TOPROL-XL) 25 MG 24 hr tablet Take 25 mg by mouth at bedtime.    [provider]  ondansetron (ZOFRAN ODT) 4 MG disintegrating tablet Take 1 tablet (4 mg total) by mouth every 8 (eight) hours as needed for nausea or vomiting. 01/27/21   Truddie Hidden, MD  Psyllium (METAMUCIL PO) Take 1 Scoop by mouth at bedtime as needed (constipation).    [provider]  rosuvastatin (CRESTOR) 5 MG tablet Take 5 mg by mouth daily.    [provider]  triamterene-hydrochlorothiazide (MAXZIDE-25) 37.5-25 MG tablet Take 1 Tablet (3) Three Time a Week as Needed 02/11/21   Erlene Quan, PA-C  Vitamin E 134 MG (200 UNIT) CAPS Take 400 Units by mouth daily.      [provider]   Physical Exam:  Constitutional: Elderly female who appears ill but in no acute distress Vitals:   02/11/21 1310 02/11/21 1315 02/11/21 1345 02/11/21 1415  BP:  (!) 148/50 (!) 158/91 (!) 153/49  Pulse: 72 70 81 80  Resp: 18 13 17 12   SpO2: 99% 98% 98% 97%   Eyes: PERRL, ecchymosis noted around the left eye ENMT: Mucous membranes are moist. Posterior pharynx clear of any exudate or lesions. hard of hearing. Neck: normal, supple, no masses, no thyromegaly Respiratory: clear to auscultation bilaterally, no wheezing, no crackles. Normal respiratory effort. No accessory muscle use.  Cardiovascular: Regular rate and rhythm, no murmurs / rubs / gallops. Trace lower extremity edema. 2+ pedal pulses. No carotid bruits.  Abdomen: no tenderness, no masses palpated. No hepatosplenomegaly. Bowel sounds positive.  Musculoskeletal: no clubbing / cyanosis. No joint deformity upper and lower extremities. Good ROM,  no contractures. Normal muscle tone.  Skin: no rashes, lesions, ulcers. No induration Neurologic: CN 2-12 grossly intact.  Able to move all extremities Psychiatric: Normal judgment and insight. Alert and oriented x 3. Normal mood.   Labs on Admission:  Basic Metabolic Panel: No results for input(s): NA, K, CL, CO2, GLUCOSE, BUN, CREATININE, CALCIUM, MG, PHOS in the last 168 hours.  Liver Function Tests: No results for input(s): AST, ALT, ALKPHOS, BILITOT, PROT, ALBUMIN in the last 168 hours. No results for input(s): LIPASE, AMYLASE in the last 168 hours. No results for input(s): AMMONIA in the last 168 hours. CBC: Recent Labs  Lab 02/11/21 1321  WBC 9.7  NEUTROABS 7.5  HGB 11.8*  HCT 33.6*  MCV 95.5  PLT 221   Cardiac Enzymes: No results for input(s): CKTOTAL, CKMB, CKMBINDEX, TROPONINI in the last 168 hours. BNP: Invalid input(s): POCBNP CBG: Recent Labs  Lab 02/04/21 1632 02/04/21 2036 02/05/21 0721 02/05/21 1139  GLUCAP 100* 123* 88 101*    Radiological Exams on Admission: DG Chest Portable 1 View  Result Date: 02/11/2021 CLINICAL DATA:  Chest pain. EXAM: PORTABLE CHEST 1 VIEW COMPARISON:  Chest x-ray 05/09/2018. FINDINGS: Mediastinum and hilar structures normal. Heart size normal. No focal infiltrate. Low lung volumes with mild bibasilar atelectasis. No pleural effusion or pneumothorax. Surgical clips right upper quadrant. IMPRESSION: No acute cardiopulmonary disease. Low lung volumes with mild bibasilar atelectasis. Electronically Signed   By: Marcello Moores  Register   On: 02/11/2021 15:15    EKG: Independently reviewed.  Sinus rhythm at 78 bpm with QTC calculated around 513.  Time spent: >45 minutes  [facility]   If 7PM-7AM, please contact night-coverage

## 2021-02-11 NOTE — Assessment & Plan Note (Signed)
Cleared 01/21/2021 but then admitted for syncope 01/27/2021-02/04/2021 Echo 02/02/2021- normal LVF

## 2021-02-11 NOTE — Assessment & Plan Note (Signed)
PCP follows 

## 2021-02-12 DIAGNOSIS — Y929 Unspecified place or not applicable: Secondary | ICD-10-CM | POA: Diagnosis not present

## 2021-02-12 DIAGNOSIS — R0989 Other specified symptoms and signs involving the circulatory and respiratory systems: Secondary | ICD-10-CM | POA: Diagnosis not present

## 2021-02-12 DIAGNOSIS — E860 Dehydration: Secondary | ICD-10-CM | POA: Diagnosis not present

## 2021-02-12 DIAGNOSIS — Z9842 Cataract extraction status, left eye: Secondary | ICD-10-CM | POA: Diagnosis not present

## 2021-02-12 DIAGNOSIS — Z20822 Contact with and (suspected) exposure to covid-19: Secondary | ICD-10-CM | POA: Diagnosis not present

## 2021-02-12 DIAGNOSIS — M7989 Other specified soft tissue disorders: Secondary | ICD-10-CM | POA: Diagnosis not present

## 2021-02-12 DIAGNOSIS — I358 Other nonrheumatic aortic valve disorders: Secondary | ICD-10-CM | POA: Diagnosis not present

## 2021-02-12 DIAGNOSIS — I110 Hypertensive heart disease with heart failure: Secondary | ICD-10-CM | POA: Diagnosis not present

## 2021-02-12 DIAGNOSIS — E785 Hyperlipidemia, unspecified: Secondary | ICD-10-CM | POA: Diagnosis present

## 2021-02-12 DIAGNOSIS — E876 Hypokalemia: Secondary | ICD-10-CM | POA: Diagnosis not present

## 2021-02-12 DIAGNOSIS — K219 Gastro-esophageal reflux disease without esophagitis: Secondary | ICD-10-CM | POA: Diagnosis present

## 2021-02-12 DIAGNOSIS — Z888 Allergy status to other drugs, medicaments and biological substances status: Secondary | ICD-10-CM | POA: Diagnosis not present

## 2021-02-12 DIAGNOSIS — E861 Hypovolemia: Secondary | ICD-10-CM | POA: Diagnosis not present

## 2021-02-12 DIAGNOSIS — I951 Orthostatic hypotension: Secondary | ICD-10-CM | POA: Diagnosis not present

## 2021-02-12 DIAGNOSIS — Z882 Allergy status to sulfonamides status: Secondary | ICD-10-CM | POA: Diagnosis not present

## 2021-02-12 DIAGNOSIS — Z87891 Personal history of nicotine dependence: Secondary | ICD-10-CM | POA: Diagnosis not present

## 2021-02-12 DIAGNOSIS — T502X5A Adverse effect of carbonic-anhydrase inhibitors, benzothiadiazides and other diuretics, initial encounter: Secondary | ICD-10-CM | POA: Diagnosis not present

## 2021-02-12 DIAGNOSIS — Z7984 Long term (current) use of oral hypoglycemic drugs: Secondary | ICD-10-CM | POA: Diagnosis not present

## 2021-02-12 DIAGNOSIS — I5032 Chronic diastolic (congestive) heart failure: Secondary | ICD-10-CM | POA: Diagnosis not present

## 2021-02-12 DIAGNOSIS — E1151 Type 2 diabetes mellitus with diabetic peripheral angiopathy without gangrene: Secondary | ICD-10-CM | POA: Diagnosis not present

## 2021-02-12 DIAGNOSIS — Z9841 Cataract extraction status, right eye: Secondary | ICD-10-CM | POA: Diagnosis not present

## 2021-02-12 DIAGNOSIS — Z79890 Hormone replacement therapy (postmenopausal): Secondary | ICD-10-CM | POA: Diagnosis not present

## 2021-02-12 DIAGNOSIS — R55 Syncope and collapse: Secondary | ICD-10-CM | POA: Diagnosis present

## 2021-02-12 DIAGNOSIS — J449 Chronic obstructive pulmonary disease, unspecified: Secondary | ICD-10-CM | POA: Diagnosis present

## 2021-02-12 DIAGNOSIS — E872 Acidosis: Secondary | ICD-10-CM | POA: Diagnosis not present

## 2021-02-12 DIAGNOSIS — E039 Hypothyroidism, unspecified: Secondary | ICD-10-CM | POA: Diagnosis present

## 2021-02-12 DIAGNOSIS — E871 Hypo-osmolality and hyponatremia: Secondary | ICD-10-CM | POA: Diagnosis not present

## 2021-02-12 DIAGNOSIS — Z79899 Other long term (current) drug therapy: Secondary | ICD-10-CM | POA: Diagnosis not present

## 2021-02-12 LAB — GLUCOSE, CAPILLARY
Glucose-Capillary: 105 mg/dL — ABNORMAL HIGH (ref 70–99)
Glucose-Capillary: 111 mg/dL — ABNORMAL HIGH (ref 70–99)
Glucose-Capillary: 106 mg/dL — ABNORMAL HIGH (ref 70–99)

## 2021-02-12 LAB — MAGNESIUM: Magnesium: 1.7 mg/dL (ref 1.7–2.4)

## 2021-02-12 LAB — BASIC METABOLIC PANEL
Anion gap: 6 (ref 5–15)
BUN: 13 mg/dL (ref 8–23)
CO2: 21 mmol/L — ABNORMAL LOW (ref 22–32)
Calcium: 8.9 mg/dL (ref 8.9–10.3)
Chloride: 99 mmol/L (ref 98–111)
Creatinine, Ser: 0.86 mg/dL (ref 0.44–1.00)
GFR, Estimated: >60 mL/min (ref >60)
Glucose, Bld: 101 mg/dL — ABNORMAL HIGH (ref 70–99)
Potassium: 3.6 mmol/L (ref 3.5–5.1)
Sodium: 126 mmol/L — ABNORMAL LOW (ref 135–145)

## 2021-02-12 LAB — CBC
HCT: 30.1 % — ABNORMAL LOW (ref 36.0–46.0)
Hemoglobin: 10.5 g/dL — ABNORMAL LOW (ref 12.0–15.0)
MCH: 33.4 pg (ref 26.0–34.0)
MCHC: 34.9 g/dL (ref 30.0–36.0)
MCV: 95.9 fL (ref 80.0–100.0)
Platelets: 191 10*3/uL (ref 150–400)
RBC: 3.14 MIL/uL — ABNORMAL LOW (ref 3.87–5.11)
RDW: 12.1 % (ref 11.5–15.5)
WBC: 7.5 10*3/uL (ref 4.0–10.5)
nRBC: 0 % (ref 0.0–0.2)

## 2021-02-12 LAB — TROPONIN I (HIGH SENSITIVITY): Troponin I (High Sensitivity): 10 ng/L (ref <18)

## 2021-02-12 LAB — CREATININE, SERUM
Creatinine, Ser: 0.94 mg/dL (ref 0.44–1.00)
GFR, Estimated: >60 mL/min (ref >60)

## 2021-02-12 LAB — CORTISOL-AM, BLOOD: Cortisol - AM: 5.2 ug/dL — ABNORMAL LOW (ref 6.7–22.6)

## 2021-02-12 LAB — OSMOLALITY, URINE: Osmolality, Ur: 182 mOsm/kg — ABNORMAL LOW (ref 300–900)

## 2021-02-12 MED ORDER — POTASSIUM CHLORIDE CRYS ER 20 MEQ PO TBCR
20.0000 meq | EXTENDED_RELEASE_TABLET | Freq: Once | ORAL | Status: AC
  Administered 2021-02-12: 20 meq via ORAL
  Filled 2021-02-12: qty 1

## 2021-02-12 MED ORDER — SODIUM CHLORIDE 0.9 % IV SOLN: INTRAVENOUS | Status: DC

## 2021-02-12 MED ORDER — LEVOTHYROXINE SODIUM 25 MCG PO TABS
25.0000 ug | ORAL_TABLET | ORAL | Status: DC
  Administered 2021-02-14 – 2021-02-15 (×2): 25 ug via ORAL
  Filled 2021-02-12 (×3): qty 1

## 2021-02-12 MED ORDER — SODIUM CHLORIDE 0.9 % IV BOLUS
1000.0000 mL | Freq: Once | INTRAVENOUS | Status: AC
  Administered 2021-02-12: 1000 mL via INTRAVENOUS

## 2021-02-12 MED ORDER — COSYNTROPIN 0.25 MG IJ SOLR
0.2500 mg | Freq: Once | INTRAMUSCULAR | Status: AC
  Administered 2021-02-13: 0.25 mg via INTRAVENOUS
  Filled 2021-02-12: qty 0.25

## 2021-02-12 MED ORDER — LEVOTHYROXINE SODIUM 100 MCG PO TABS
100.0000 ug | ORAL_TABLET | ORAL | Status: DC
  Administered 2021-02-13 – 2021-02-16 (×2): 100 ug via ORAL
  Filled 2021-02-12 (×2): qty 1

## 2021-02-12 NOTE — Progress Notes (Signed)
PROGRESS NOTE    Christina Sellers  EGB:[id number] DOB: 1941/05/11 DOA: 02/11/2021 PCP: Josetta Huddle, MD   Brief Narrative: 80 year old with past medical history significant for mild mitral regurgitation, diabetes who presented to the ED after syncope episode at the cardiology clinic.  Patient has had 3 syncope episode over the last 2 weeks.  Patient had one episode that she lost consciousness, briefly she underwent chest compression, came to.  Felt to be pulseless initially.   Patient admitted for further evaluation of syncope/PEA arrest by cardiology.  Was found to be hyponatremic with a sodium level at 121, cortisol level was low at 5.2.  CT angio negative for PE.  Assessment & Plan:   Active Problems:   Syncope   IBS (irritable bowel syndrome)   Hyponatremia   1-Syncope/PEA arrest: -Could be related to hypovolemia, versus vasovagal.  Cardiology following -Continue to monitor on telemetry -Continue with IV fluids -Orthostatic vitals -Cortisol level low, plan to do ACTH test.  2-Hyponatremia; -Suspect related to [facility] and recent history of diarrhea. -Improving with IV fluids.  Repeat lab work tomorrow  3-Hypertension: Holding triamterene/hydrochlorothiazide and metoprolol due to hypotension. Will avoid indefinitely hydrochlorothiazide/triamterene  4-Hypothyroidism Free T4 mildly elevated.  I have reduce total dose of Synthroid to 125 mcg from 150  5-hyperlipidemia: Continue with Crestor  6-Prolonged QT: Repeat EKG 7-Nausea, history of IBS: Added on Carafate.  Patient denies nausea today feeling better. 8-Hypokalemia: Repleted 9-Metabolic acidosis: Related to recent diarrhea illness: Improved with IV fluids 10-DM ; hold metformin.   Estimated body mass index is 25.6 kg/m as calculated from the following:   Height as of an earlier encounter on 02/11/21: 4\' 11"  (1.499 m).   Weight as of this encounter: 57.5 kg.   DVT prophylaxis: Lovenox Code Status: Full  code Family Communication: Care discussed with patient and daughter was at bedside Disposition Plan:  Status is: Inpatient  Dispo: The patient is from: Home              Anticipated d/c is to: To be determined              Patient currently is not medically stable to d/c.   Difficult to place patient No        Consultants:   Cardiology   Procedures:   None  Antimicrobials:    Subjective: She reported a history of 3 days of diarrhea recently, several BM.  She was feeling dizzy when she sat up today.   Objective: Vitals:   02/12/21 0015 02/12/21 0017 02/12/21 0030 02/12/21 0448  BP: (!) 114/58  135/78 (!) 107/43  Pulse: 86  75 84  Resp: 19  18 20   Temp:  98 F (36.7 C) 98.1 F (36.7 C) 98 F (36.7 C)  TempSrc:  Axillary Oral Oral  SpO2: 99%  99% 97%  Weight:   57.5 kg     Intake/Output Summary (Last 24 hours) at 02/12/2021 1226 Last data filed at 02/12/2021 1100 Gross per 24 hour  Intake 742.82 ml  Output 840 ml  Net -97.18 ml   Filed Weights   02/12/21 0030  Weight: 57.5 kg    Examination:  General exam: Appears calm and comfortable  Respiratory system: Clear to auscultation. Respiratory effort normal. Cardiovascular system: S1 & S2 heard, RRR. No JVD, murmurs, rubs, gallops or clicks. No pedal edema. Gastrointestinal system: Abdomen is nondistended, soft and nontender. No organomegaly or masses felt. Normal bowel sounds heard. Central nervous system: Alert and oriented.  Extremities: Symmetric  5 x 5 power.   Data Reviewed: I have personally reviewed following labs and imaging studies  CBC: Recent Labs  Lab 02/11/21 1321 02/12/21 0209  WBC 9.7 7.5  NEUTROABS 7.5  --   HGB 11.8* 10.5*  HCT 33.6* 30.1*  MCV 95.5 95.9  PLT 221 154   Basic Metabolic Panel: Recent Labs  Lab 02/11/21 1321 02/11/21 1447 02/12/21 0209 02/12/21 0744  NA  --  121*  --  126*  K  --  3.3*  --  3.6  CL  --  88*  --  99  CO2  --  19*  --  21*  GLUCOSE  --   122*  --  101*  BUN  --  19  --  13  CREATININE  --  0.88 0.94 0.86  CALCIUM  --  9.6  --  8.9  MG 1.9  --   --  1.7   GFR: Estimated Creatinine Clearance: 39.6 mL/min (by C-G formula based on SCr of 0.86 mg/dL). Liver Function Tests: Recent Labs  Lab 02/11/21 1447  AST 24  ALT 17  ALKPHOS 44  BILITOT 1.0  PROT 6.6  ALBUMIN 4.0   No results for input(s): LIPASE, AMYLASE in the last 168 hours. No results for input(s): AMMONIA in the last 168 hours. Coagulation Profile: No results for input(s): INR, PROTIME in the last 168 hours. Cardiac Enzymes: No results for input(s): CKTOTAL, CKMB, CKMBINDEX, TROPONINI in the last 168 hours. BNP (last 3 results) Recent Labs    01/21/21 1223  PROBNP 551   HbA1C: No results for input(s): HGBA1C in the last 72 hours. CBG: Recent Labs  Lab 02/11/21 2229 02/12/21 0752  GLUCAP 126* 105*   Lipid Profile: No results for input(s): CHOL, HDL, LDLCALC, TRIG, CHOLHDL, LDLDIRECT in the last 72 hours. Thyroid Function Tests: Recent Labs    02/11/21 1447  TSH 1.151  FREET4 1.67*   Anemia Panel: No results for input(s): VITAMINB12, FOLATE, FERRITIN, TIBC, IRON, RETICCTPCT in the last 72 hours. Sepsis Labs: No results for input(s): PROCALCITON, LATICACIDVEN in the last 168 hours.  Recent Results (from the past 240 hour(s))  SARS CORONAVIRUS 2 (TAT 6-24 HRS) Nasopharyngeal Nasopharyngeal Swab     Status: None   Collection Time: 02/11/21  1:27 PM   Specimen: Nasopharyngeal Swab  Result Value Ref Range Status   SARS Coronavirus 2 NEGATIVE NEGATIVE Final    Comment: (NOTE) SARS-CoV-2 target nucleic acids are NOT DETECTED.  The SARS-CoV-2 RNA is generally detectable in upper and lower respiratory specimens during the acute phase of infection. Negative results do not preclude SARS-CoV-2 infection, do not rule out co-infections with other pathogens, and should not be used as the sole basis for treatment or other patient management  decisions. Negative results must be combined with clinical observations, patient history, and epidemiological information. The expected result is Negative.  Fact Sheet for Patients: SugarRoll.be  Fact Sheet for Healthcare Providers: https://www.woods-mathews.com/  This test is not yet approved or cleared by the [place] FDA and  has been authorized for detection and/or diagnosis of SARS-CoV-2 by FDA under an Emergency Use Authorization (EUA). This EUA will remain  in effect (meaning this test can be used) for the duration of the COVID-19 declaration under Se ction 564(b)(1) of the Act, 21 U.S.C. section 360bbb-3(b)(1), unless the authorization is terminated or revoked sooner.  Performed at [facility], [place] [address], [place], [place] [address]  Radiology Studies: CT ANGIO CHEST PE W OR WO CONTRAST  Result Date: 02/11/2021 CLINICAL DATA:  Syncope. EXAM: CT ANGIOGRAPHY CHEST WITH CONTRAST TECHNIQUE: Multidetector CT imaging of the chest was performed using the standard protocol during bolus administration of intravenous contrast. Multiplanar CT image reconstructions and MIPs were obtained to evaluate the vascular anatomy. CONTRAST:  17mL OMNIPAQUE IOHEXOL 350 MG/ML SOLN COMPARISON:  None. FINDINGS: Cardiovascular: Moderate severity calcification of the aortic arch is seen. There is no evidence of aneurysmal dilatation or dissection. Satisfactory opacification of the pulmonary arteries to the segmental level. No evidence of pulmonary embolism. Normal heart size with mild to moderate severity coronary artery calcification. No pericardial effusion. Mediastinum/Nodes: No enlarged mediastinal, hilar, or axillary lymph nodes. Thyroid gland, trachea, and esophagus demonstrate no significant findings. Lungs/Pleura: Very mild atelectasis is seen within the posterior aspects of the bilateral lower lobes. There is no evidence of acute  infiltrate, pleural effusion or pneumothorax. Upper Abdomen: Surgical clips are seen within the gallbladder fossa. Musculoskeletal: Multilevel degenerative changes seen throughout the thoracic spine. Review of the MIP images confirms the above findings. IMPRESSION: 1.   No evidence of pulmonary embolism. 2.   Mild bilateral lower lobe atelectasis. 3.   Evidence of prior cholecystectomy. 4. Aortic atherosclerosis. Aortic Atherosclerosis (ICD10-I70.0). Electronically Signed   By: Virgina Norfolk M.D.   On: 02/11/2021 17:23   DG Chest Portable 1 View  Result Date: 02/11/2021 CLINICAL DATA:  Chest pain. EXAM: PORTABLE CHEST 1 VIEW COMPARISON:  Chest x-ray 05/09/2018. FINDINGS: Mediastinum and hilar structures normal. Heart size normal. No focal infiltrate. Low lung volumes with mild bibasilar atelectasis. No pleural effusion or pneumothorax. Surgical clips right upper quadrant. IMPRESSION: No acute cardiopulmonary disease. Low lung volumes with mild bibasilar atelectasis. Electronically Signed   By: [place]   On: 02/11/2021 15:15        Scheduled Meds: . Derrill Memo ON 02/13/2021] cosyntropin  0.25 mg Intravenous Once  . cycloSPORINE  1 drop Both Eyes BID  . enoxaparin (LOVENOX) injection  40 mg Subcutaneous Q24H  . hydroxypropyl methylcellulose / hypromellose  1 drop Both Eyes QHS  . insulin aspart  0-15 Units Subcutaneous TID WC  . [START ON 02/14/2021] levothyroxine  25 mcg Oral Once per day on Sun Tue Thu Sat   And  . [START ON 02/13/2021] levothyroxine  100 mcg Oral Once per day on Mon Wed Fri  . rosuvastatin  5 mg Oral Daily  . sucralfate  1 g Oral TID WC & HS  . vitamin B-12  1,000 mcg Oral Daily   Continuous Infusions: . sodium chloride    . sodium chloride       LOS: 0 days    Time spent: 35 minutes    Auburn Hert A Welma Mccombs, MD [facility]   If 7PM-7AM, please contact night-coverage [email or web address]  02/12/2021, 12:26 PM

## 2021-02-12 NOTE — Evaluation (Signed)
Physical Therapy Evaluation Patient Details Name: Christina Sellers MRN: [id number] DOB: 06/20/1941 Today's Date: 02/12/2021   History of Present Illness  Pt is an 80 y/o female admitted 3/9 secondary to syncope. Pt with recent admission for the same. PMH includes DM, HTN, gout, and COPD.  Clinical Impression  Pt admitted secondary to problem above with deficits below. Pt reporting mild lightheadedness this session, but orthostatics negative. Pt requiring min to min guard A to stand and transfer to/from [facility]. Reporting increased R hip pain which limited mobility. Pt's daughter voiced concerns about pt returning home alone. Discussed SNF vs HHPT, but pt and pt's daughter want to wait and see how pt progresses with mobility. Will continue to follow acutely and update recommendations based on pt progression.     Follow Up Recommendations Supervision/Assistance - 24 hour (HHPT vs SNF pending progression)    Equipment Recommendations  None recommended by PT    Recommendations for Other Services OT consult     Precautions / Restrictions Precautions Precautions: Fall Restrictions Weight Bearing Restrictions: No      Mobility  Bed Mobility Overal bed mobility: Needs Assistance Bed Mobility: Supine to Sit;Sit to Supine     Supine to sit: Min assist Sit to supine: Min assist   General bed mobility comments: Very slow to move. Min A for trunk and LE assist throughout.    Transfers Overall transfer level: Needs assistance Equipment used: Rolling walker (2 wheeled) Transfers: Sit to/from [facility] Sit to Stand: Min assist Stand pivot transfers: Min guard       General transfer comment: Min A for lift assist and steadying to stand. Min guard A for safety to transfer to/from [facility]. Pt reporting mild lightheadedness and increased R hip pain.  Ambulation/Gait                Stairs            Wheelchair Mobility    Modified Rankin (Stroke Patients  Only)       Balance Overall balance assessment: Needs assistance Sitting-balance support: No upper extremity supported;Feet supported Sitting balance-Leahy Scale: Good     Standing balance support: Bilateral upper extremity supported Standing balance-Leahy Scale: Poor Standing balance comment: Reliant on BUE support                             Pertinent Vitals/Pain Pain Assessment: Faces Faces Pain Scale: Hurts even more Pain Location: R hip Pain Descriptors / Indicators: Grimacing;Guarding Pain Intervention(s): Limited activity within patient's tolerance;Monitored during session;Repositioned    Home Living Family/patient expects to be discharged to:: Private residence Living Arrangements: Alone Available Help at Discharge: Family;Available PRN/intermittently Type of Home: House Home Access: Stairs to enter Entrance Stairs-Rails: Right Entrance Stairs-Number of Steps: 2 Home Layout: One level Home Equipment: Walker - 2 wheels;Grab bars - tub/shower      Prior Function Level of Independence: Needs assistance   Gait / Transfers Assistance Needed: Has had increased difficulty with ambulation secondary to R hip pain.  ADL's / Homemaking Assistance Needed: Has required assist since last admission.        Hand Dominance        Extremity/Trunk Assessment   Upper Extremity Assessment Upper Extremity Assessment: Defer to OT evaluation    Lower Extremity Assessment Lower Extremity Assessment: RLE deficits/detail;Generalized weakness RLE Deficits / Details: R hip pain at baseline secondary to arthritis in hip    Cervical / Trunk Assessment Cervical /  Trunk Assessment: Normal  Communication   Communication: HOH  Cognition Arousal/Alertness: Awake/alert Behavior During Therapy: WFL for tasks assessed/performed Overall Cognitive Status: Within Functional Limits for tasks assessed                                        General Comments  General comments (skin integrity, edema, etc.): Pt's daughter present and voices concerns about pt going home. Educated about SNF, however, pt reports she would prefer to go home. Orthostatics negative    Exercises     Assessment/Plan    PT Assessment Patient needs continued PT services  PT Problem List Decreased strength;Decreased activity tolerance;Decreased balance;Decreased mobility;Cardiopulmonary status limiting activity;Pain       PT Treatment Interventions DME instruction;Gait training;Stair training;Therapeutic activities;Functional mobility training;Therapeutic exercise;Balance training;Patient/family education    PT Goals (Current goals can be found in the Care Plan section)  Acute Rehab PT Goals Patient Stated Goal: To go home PT Goal Formulation: With patient/family Time For Goal Achievement: 02/26/21 Potential to Achieve Goals: Good    Frequency Min 3X/week   Barriers to discharge        Co-evaluation               AM-PAC PT "6 Clicks" Mobility  Outcome Measure Help needed turning from your back to your side while in a flat bed without using bedrails?: A Little Help needed moving from lying on your back to sitting on the side of a flat bed without using bedrails?: A Little Help needed moving to and from a bed to a chair (including a wheelchair)?: A Little Help needed standing up from a chair using your arms (e.g., wheelchair or bedside chair)?: A Little Help needed to walk in hospital room?: A Little Help needed climbing 3-5 steps with a railing? : A Lot 6 Click Score: 17    End of Session Equipment Utilized During Treatment: Gait belt Activity Tolerance: Patient limited by pain Patient left: in bed;with call bell/phone within reach;with family/visitor present Nurse Communication: Mobility status;Other (comment) (pt's daughter asking about wound care RN coming) PT Visit Diagnosis: Other abnormalities of gait and mobility (R26.89);Difficulty in walking,  not elsewhere classified (R26.2);Pain Pain - Right/Left: Right Pain - part of body: Hip    Time: 1411-1443 PT Time Calculation (min) (ACUTE ONLY): 32 min   Charges:   PT Evaluation $PT Eval Moderate Complexity: 1 Mod PT Treatments $Therapeutic Exercise: 8-22 mins        Christina Sellers, Christina Sellers  Acute Rehabilitation Services  Pager: [phone] Office: [phone]   Rudean Hitt 02/12/2021, 4:20 PM

## 2021-02-12 NOTE — Progress Notes (Addendum)
Progress Note  Patient Name: Christina Sellers Date of Encounter: 02/12/2021  [facility] Cardiologist: Werner Lean, MD   Subjective   Felt dizzy when try to sit at edge of bed. No chest pain or dyspnea.   Inpatient Medications    Scheduled Meds:  [START ON 02/13/2021] cosyntropin  0.25 mg Intravenous Once   cycloSPORINE  1 drop Both Eyes BID   enoxaparin (LOVENOX) injection  40 mg Subcutaneous Q24H   hydroxypropyl methylcellulose / hypromellose  1 drop Both Eyes QHS   insulin aspart  0-15 Units Subcutaneous TID WC   levothyroxine  50 mcg Oral Once per day on Sun Tue Thu Sat   And   [START ON 02/13/2021] levothyroxine  100 mcg Oral Once per day on Mon Wed Fri   rosuvastatin  5 mg Oral Daily   sucralfate  1 g Oral TID WC & HS   vitamin B-12  1,000 mcg Oral Daily   Continuous Infusions:  sodium chloride 75 mL/hr at 02/11/21 1607   PRN Meds: acetaminophen, diclofenac Sodium, fluticasone, methocarbamol, nitroGLYCERIN, ondansetron   Vital Signs    Vitals:   02/12/21 0015 02/12/21 0017 02/12/21 0030 02/12/21 0448  BP: (!) 114/58  135/78 (!) 107/43  Pulse: 86  75 84  Resp: 19  18 20   Temp:  98 F (36.7 C) 98.1 F (36.7 C) 98 F (36.7 C)  TempSrc:  Axillary Oral Oral  SpO2: 99%  99% 97%  Weight:   57.5 kg     Intake/Output Summary (Last 24 hours) at 02/12/2021 1751 Last data filed at 02/12/2021 0800 Gross per 24 hour  Intake 742.82 ml  Output 720 ml  Net 22.82 ml   Last 3 Weights 02/12/2021 02/11/2021 02/05/2021  Weight (lbs) 126 lb 12.2 oz 125 lb 128 lb 9.6 oz  Weight (kg) 57.5 kg 56.7 kg 58.333 kg      Telemetry    SR, PAC - Personally Reviewed  ECG  N/A  Physical Exam   GEN: Elderly ill appearing female in no acute distress.   Neck: No JVD, Left eye ecchymosis from prior fall Cardiac: RRR, no murmurs, rubs, or gallops.  Respiratory: Clear to auscultation bilaterally. GI: Soft, nontender, non-distended  MS: No edema; No deformity,  Right lateral tibial ecchymosis Neuro:  Nonfocal  Psych: Normal affect   Labs    High Sensitivity Troponin:   Recent Labs  Lab 02/01/21 2200 02/11/21 1321 02/12/21 0209  TROPONINIHS 10 9 10       Chemistry Recent Labs  Lab 02/11/21 1447 02/12/21 0209 02/12/21 0744  NA 121*  --  126*  K 3.3*  --  3.6  CL 88*  --  99  CO2 19*  --  21*  GLUCOSE 122*  --  101*  BUN 19  --  13  CREATININE 0.88 0.94 0.86  CALCIUM 9.6  --  8.9  PROT 6.6  --   --   ALBUMIN 4.0  --   --   AST 24  --   --   ALT 17  --   --   ALKPHOS 44  --   --   BILITOT 1.0  --   --   GFRNONAA >60 >60 >60  ANIONGAP 14  --  6     Hematology Recent Labs  Lab 02/11/21 1321 02/12/21 0209  WBC 9.7 7.5  RBC 3.52* 3.14*  HGB 11.8* 10.5*  HCT 33.6* 30.1*  MCV 95.5 95.9  MCH 33.5 33.4  MCHC  35.1 34.9  RDW 12.1 12.1  PLT 221 191    DDimer  Recent Labs  Lab 02/11/21 1447  DDIMER 1.05*     Radiology    CT ANGIO CHEST PE W OR WO CONTRAST  Result Date: 02/11/2021 CLINICAL DATA:  Syncope. EXAM: CT ANGIOGRAPHY CHEST WITH CONTRAST TECHNIQUE: Multidetector CT imaging of the chest was performed using the standard protocol during bolus administration of intravenous contrast. Multiplanar CT image reconstructions and MIPs were obtained to evaluate the vascular anatomy. CONTRAST:  49mL OMNIPAQUE IOHEXOL 350 MG/ML SOLN COMPARISON:  None. FINDINGS: Cardiovascular: Moderate severity calcification of the aortic arch is seen. There is no evidence of aneurysmal dilatation or dissection. Satisfactory opacification of the pulmonary arteries to the segmental level. No evidence of pulmonary embolism. Normal heart size with mild to moderate severity coronary artery calcification. No pericardial effusion. Mediastinum/Nodes: No enlarged mediastinal, hilar, or axillary lymph nodes. Thyroid gland, trachea, and esophagus demonstrate no significant findings. Lungs/Pleura: Very mild atelectasis is seen within the posterior aspects of  the bilateral lower lobes. There is no evidence of acute infiltrate, pleural effusion or pneumothorax. Upper Abdomen: Surgical clips are seen within the gallbladder fossa. Musculoskeletal: Multilevel degenerative changes seen throughout the thoracic spine. Review of the MIP images confirms the above findings. IMPRESSION: 1.   No evidence of pulmonary embolism. 2.   Mild bilateral lower lobe atelectasis. 3.   Evidence of prior cholecystectomy. 4. Aortic atherosclerosis. Aortic Atherosclerosis (ICD10-I70.0). Electronically Signed   By: Virgina Norfolk M.D.   On: 02/11/2021 17:23   DG Chest Portable 1 View  Result Date: 02/11/2021 CLINICAL DATA:  Chest pain. EXAM: PORTABLE CHEST 1 VIEW COMPARISON:  Chest x-ray 05/09/2018. FINDINGS: Mediastinum and hilar structures normal. Heart size normal. No focal infiltrate. Low lung volumes with mild bibasilar atelectasis. No pleural effusion or pneumothorax. Surgical clips right upper quadrant. IMPRESSION: No acute cardiopulmonary disease. Low lung volumes with mild bibasilar atelectasis. Electronically Signed   By: Marcello Moores  Register   On: 02/11/2021 15:15    Cardiac Studies   Echo 02/02/21 1. Left ventricular ejection fraction, by estimation, is 65 to 70%. The  left ventricle has normal function. The left ventricle has no regional  wall motion abnormalities. Left ventricular diastolic parameters are  consistent with Grade I diastolic  dysfunction (impaired relaxation).  2. Right ventricular systolic function is normal. The right ventricular  size is mildly enlarged. There is normal pulmonary artery systolic  pressure.  3. Left atrial size was mild to moderately dilated.  4. The mitral valve is normal in structure. Mild mitral valve  regurgitation. No evidence of mitral stenosis. Moderate mitral annular  calcification.  5. Tricuspid valve regurgitation is mild to moderate.  6. The aortic valve is normal in structure. Aortic valve regurgitation is  not  visualized. No aortic stenosis is present.  7. The inferior vena cava is normal in size with greater than 50%  respiratory variability, suggesting right atrial pressure of 3 mmHg.   Patient Profile     80 y.o. female hx of HTN, HLD, mild mitral regurgitation, diabetes, PVD, COPD, IBS, hypothyroidism and chronic diastolic HF presented with syncope/cardiac arrest.   Assessment & Plan    1. Syncope/PEA arrest - Prior episode of fainting requiring admission and found to be UTI, hypovolemic and hyponatremic.  - prodromal symptoms of feeling bed with smelling salt. Had sweating and nausea.  - brief pulseless and LOC after seeing Kerin Ransom in clinic yesterday>>brief compression and came back - Echo 02/02/21 with  EF of 65-70% and grade 1DD - Elevated D-dimer>> CTA of chest without PE - Low cortisol - Normal TSH but elevated free T4 - Hypokalemia resolved with supplement, sodium improving  -Tele without arrhythmias  2. HTN - Antihypertensive held on admission - Felt dizzy when tried to sit on edge of bed.  - Check orthostatic and will required PT  3. Hypothyroidism -  Normal TSH but elevated free T4 - Per IM  4. Low Cortisol - pending ACTH  5. Hyponatremia - improving  6. Hypokalemia - Resolved     For questions or updates, please contact [place] Please consult [email or web address] for contact info under        SignedLeanor Kail, PA  02/12/2021, 9:04 AM    Personally seen and examined. Agree with above.   Multiple episodes of syncope recently.  Thankfully no PE.  We will avoid utilizing Maxide again given the hydrochlorothiazide.  This is likely contributing to her hyponatremia.  Agree with cosyntropin stim test.  A.m. cortisol was low.  Could she have adrenal insufficiency.  We will go ahead and give her more fluid today.  Bolus 1 L  Free T4 slightly elevated.  Decrease Synthroid per hospitalist team recommendation.  Appreciate hospitalist team  recommendations given history of IBS nausea.  Candee Furbish, MD

## 2021-02-12 NOTE — TOC Initial Note (Signed)
Transition of Care [facility]) - Initial/Assessment Note    Patient Details  Name: Christina Sellers MRN: [id number] Date of Birth: October 23, 1941  [facility] CM/SW Contact:    Bethena Roys, RN Phone Number: 02/12/2021, 4:40 PM  Clinical Narrative:  Patient presented for syncopal episode. Prior to arrival patient was from home alone. Patient has support of children that live out of town and she has a housekeeper that checks in on her. Case Manager was able to assist in her transition of care plan on her last admission and she was set up with [facility] for home health services- patient had one visit from [facility] before she returned to the hospital. Case Manager spoke with daughter Lattie Haw @ [phone] and she had concerns regarding the patient returning home alone. Daughter mentioned that the patient needs to be cleared for hip surgery (unusre when that surgery will take place). Case Manager discussed that if patient has 24 hour supervision in the home, the patient would be safe to return home. Case Manager discussed the option of personal care services and out of pocket cost from the patient. Daughter mentioned skilled nursing facility (SNF) and asked about [facility]. We also discussed other options such as [facility]. Daughter wanted to discuss options with the patient. Case Manager will speak with family on Friday. If the plan is for SNF- we will need to start insurance authorization.  Case Manager will follow up with the family in the morning.         Expected Discharge Plan: [place] Barriers to Discharge: Continued Medical Work up   Patient Goals and CMS Choice Patient states their goals for this hospitalization and ongoing recovery are:: to return home   Choice offered to / list presented to : NA (Currently active with [facility])  Expected Discharge Plan and Services Expected Discharge Plan: [place] In-house Referral: NA Discharge  Planning Services: CM Consult Post Acute Care Choice: [place] arrangements for the past 2 months: Single Family Home                   Prior Living Arrangements/Services Living arrangements for the past 2 months: Single Family Home Lives with:: Self (has support of children and house keeper)   Do you feel safe going back to the place where you live?: Yes          Current home services: Home RN,Home PT ([facility] with [facility])    Permission Sought/Granted Permission sought to share information with : [place] granted to share info w AGENCY: [place]           Alcohol / Substance Use: Not Applicable Psych Involvement: No (comment)  Admission diagnosis:  Syncope [R55] Syncope, unspecified syncope type [R55] Patient Active Problem List   Diagnosis Date Noted  . Syncope, unspecified syncope type 02/12/2021  . History of chest pain 02/11/2021  . IBS (irritable bowel syndrome) 02/11/2021  . Hyponatremia 02/11/2021  . Acute cystitis without hematuria   . Chronic back pain   . Syncope 02/01/2021  . E-coli UTI 02/01/2021  . Dehydration with hyponatremia 02/01/2021  . Nausea and vomiting 02/01/2021  . Acute diarrhea 02/01/2021  . Chronic diastolic CHF (congestive heart failure) ([place]) 02/01/2021  . Metabolic acidosis 45/80/9983  . Preoperative cardiovascular examination 01/21/2021  . Bilateral edema of lower extremity 01/21/2021  . Hypertension associated with diabetes ([place]) 01/21/2021  . Mixed diabetic  hyperlipidemia associated with type 2 diabetes mellitus ([place]) 01/21/2021  . Aortic atherosclerosis ([place]) 01/21/2021  . Chronic low back pain 10/05/2019  . Type 2 diabetes mellitus with hyperglycemia, without long-term current use of insulin ([place]) 02/21/2019  . Hypothyroidism 02/21/2019  . Pain of left hip joint 12/08/2018  . Dermatochalasis of both eyelids 07/04/2018  . Viral labyrinthitis 01/13/2017   . Atherosclerotic peripheral vascular disease ([place])   . Essential hypertension, benign 11/22/2013  . Osteopenia 11/22/2013  . Labial fusion    PCP:  Josetta Huddle, MD Pharmacy:   [place], [place] [address] Phone: [phone] Fax: [phone]  Readmission Risk Interventions No flowsheet data found.

## 2021-02-13 DIAGNOSIS — E871 Hypo-osmolality and hyponatremia: Secondary | ICD-10-CM | POA: Diagnosis not present

## 2021-02-13 LAB — BASIC METABOLIC PANEL
Anion gap: 9 (ref 5–15)
BUN: 13 mg/dL (ref 8–23)
CO2: 19 mmol/L — ABNORMAL LOW (ref 22–32)
Calcium: 8.7 mg/dL — ABNORMAL LOW (ref 8.9–10.3)
Chloride: 103 mmol/L (ref 98–111)
Creatinine, Ser: 0.72 mg/dL (ref 0.44–1.00)
GFR, Estimated: >60 mL/min (ref >60)
Glucose, Bld: 87 mg/dL (ref 70–99)
Potassium: 3.5 mmol/L (ref 3.5–5.1)
Sodium: 131 mmol/L — ABNORMAL LOW (ref 135–145)

## 2021-02-13 LAB — GLUCOSE, CAPILLARY
Glucose-Capillary: 98 mg/dL (ref 70–99)
Glucose-Capillary: 115 mg/dL — ABNORMAL HIGH (ref 70–99)
Glucose-Capillary: 105 mg/dL — ABNORMAL HIGH (ref 70–99)
Glucose-Capillary: 132 mg/dL — ABNORMAL HIGH (ref 70–99)

## 2021-02-13 LAB — CBC
HCT: 31 % — ABNORMAL LOW (ref 36.0–46.0)
Hemoglobin: 10.5 g/dL — ABNORMAL LOW (ref 12.0–15.0)
MCH: 33.1 pg (ref 26.0–34.0)
MCHC: 33.9 g/dL (ref 30.0–36.0)
MCV: 97.8 fL (ref 80.0–100.0)
Platelets: 205 10*3/uL (ref 150–400)
RBC: 3.17 MIL/uL — ABNORMAL LOW (ref 3.87–5.11)
RDW: 12.6 % (ref 11.5–15.5)
WBC: 5.9 10*3/uL (ref 4.0–10.5)
nRBC: 0 % (ref 0.0–0.2)

## 2021-02-13 MED ORDER — SODIUM CHLORIDE 0.9 % IV SOLN: INTRAVENOUS | Status: DC

## 2021-02-13 MED ORDER — POTASSIUM CHLORIDE CRYS ER 20 MEQ PO TBCR
40.0000 meq | EXTENDED_RELEASE_TABLET | Freq: Once | ORAL | Status: AC
  Administered 2021-02-13: 40 meq via ORAL
  Filled 2021-02-13: qty 2

## 2021-02-13 MED ORDER — COSYNTROPIN 0.25 MG IJ SOLR
0.2500 mg | Freq: Once | INTRAMUSCULAR | Status: AC
  Administered 2021-02-14: 0.25 mg via INTRAVENOUS
  Filled 2021-02-13: qty 0.25

## 2021-02-13 MED ORDER — POLYETHYLENE GLYCOL 3350 17 G PO PACK
17.0000 g | PACK | Freq: Every day | ORAL | Status: DC
  Administered 2021-02-13: 17 g via ORAL
  Filled 2021-02-13 (×2): qty 1

## 2021-02-13 NOTE — Consult Note (Addendum)
[facility] Nurse Consult Note: Reason for Consult: Consult requested for left arm wound.  Wound type: Partial thickness skin tear to left anterior forearm; 3X3X.1cm, skin approximated over 90% of wound bed, 10% red and dry, no drainage Dressing procedure/placement/frequency: Topical treatment orders provided for bedside nurses to perform as follows to promote moist healing: Apply vaseline gauze to left arm Q day, then cover with foam dressing.  (Change foam dressing Q 3 days or PRN soiling.) Please re-consult if further assistance is needed.  Thank-you,  Julien Girt MSN, [place], [place], [place], [place]

## 2021-02-13 NOTE — Progress Notes (Addendum)
Progress Note  Patient Name: Sharalyn Lomba Date of Encounter: 02/13/2021  [facility] Cardiologist: Werner Lean, MD   Subjective   Felt dizzy yesterday while standing up for bathroom. No chest pain or dyspnea.   Inpatient Medications    Scheduled Meds: . cycloSPORINE  1 drop Both Eyes BID  . enoxaparin (LOVENOX) injection  40 mg Subcutaneous Q24H  . hydroxypropyl methylcellulose / hypromellose  1 drop Both Eyes QHS  . insulin aspart  0-15 Units Subcutaneous TID WC  . [START ON 02/14/2021] levothyroxine  25 mcg Oral Once per day on Sun Tue Thu Sat   And  . levothyroxine  100 mcg Oral Once per day on Mon Wed Fri  . rosuvastatin  5 mg Oral Daily  . sucralfate  1 g Oral TID WC & HS  . vitamin B-12  1,000 mcg Oral Daily   Continuous Infusions: . sodium chloride 75 mL/hr at 02/13/21 0546   PRN Meds: acetaminophen, diclofenac Sodium, fluticasone, methocarbamol, nitroGLYCERIN, ondansetron   Vital Signs    Vitals:   02/12/21 1715 02/12/21 2100 02/13/21 0045 02/13/21 0736  BP: (!) 155/53 (!) 149/49 (!) 110/46 (!) 139/53  Pulse: 97 80 69 70  Resp: 14 14 14 13   Temp:  98.3 F (36.8 C) 97.6 F (36.4 C)   TempSrc:  Oral Axillary Oral  SpO2:  95% 94% 99%  Weight:        Intake/Output Summary (Last 24 hours) at 02/13/2021 0827 Last data filed at 02/13/2021 0546 Gross per 24 hour  Intake 558.35 ml  Output 1120 ml  Net -561.65 ml   Last 3 Weights 02/12/2021 02/11/2021 02/05/2021  Weight (lbs) 126 lb 12.2 oz 125 lb 128 lb 9.6 oz  Weight (kg) 57.5 kg 56.7 kg 58.333 kg      Telemetry    NSR - Personally Reviewed  ECG    SR, QT/QTc 392/469 ms - Personally Reviewed  Physical Exam   GEN: Elderly ill appearing female in no acute distress.   Neck: No JVD, Left eye ecchymosis from prior fall Cardiac: RRR, no murmurs, rubs, or gallops.  Respiratory: Clear to auscultation bilaterally. GI: Soft, nontender, non-distended  MS: No edema; No deformity.Right lateral  tibial ecchymosis Neuro:  Nonfocal  Psych: Normal affect   Labs    High Sensitivity Troponin:   Recent Labs  Lab 02/01/21 2200 02/11/21 1321 02/12/21 0209  TROPONINIHS 10 9 10       Chemistry Recent Labs  Lab 02/11/21 1447 02/12/21 0209 02/12/21 0744 02/13/21 0700  NA 121*  --  126* 131*  K 3.3*  --  3.6 3.5  CL 88*  --  99 103  CO2 19*  --  21* 19*  GLUCOSE 122*  --  101* 87  BUN 19  --  13 13  CREATININE 0.88 0.94 0.86 0.72  CALCIUM 9.6  --  8.9 8.7*  PROT 6.6  --   --   --   ALBUMIN 4.0  --   --   --   AST 24  --   --   --   ALT 17  --   --   --   ALKPHOS 44  --   --   --   BILITOT 1.0  --   --   --   GFRNONAA >60 >60 >60 >60  ANIONGAP 14  --  6 9     Hematology Recent Labs  Lab 02/11/21 1321 02/12/21 0209 02/13/21 0700  WBC 9.7  7.5 5.9  RBC 3.52* 3.14* 3.17*  HGB 11.8* 10.5* 10.5*  HCT 33.6* 30.1* 31.0*  MCV 95.5 95.9 97.8  MCH 33.5 33.4 33.1  MCHC 35.1 34.9 33.9  RDW 12.1 12.1 12.6  PLT 221 191 205    BNPNo results for input(s): BNP, PROBNP in the last 168 hours.   DDimer  Recent Labs  Lab 02/11/21 1447  DDIMER 1.05*     Radiology    CT ANGIO CHEST PE W OR WO CONTRAST  Result Date: 02/11/2021 CLINICAL DATA:  Syncope. EXAM: CT ANGIOGRAPHY CHEST WITH CONTRAST TECHNIQUE: Multidetector CT imaging of the chest was performed using the standard protocol during bolus administration of intravenous contrast. Multiplanar CT image reconstructions and MIPs were obtained to evaluate the vascular anatomy. CONTRAST:  58mL OMNIPAQUE IOHEXOL 350 MG/ML SOLN COMPARISON:  None. FINDINGS: Cardiovascular: Moderate severity calcification of the aortic arch is seen. There is no evidence of aneurysmal dilatation or dissection. Satisfactory opacification of the pulmonary arteries to the segmental level. No evidence of pulmonary embolism. Normal heart size with mild to moderate severity coronary artery calcification. No pericardial effusion. Mediastinum/Nodes: No enlarged  mediastinal, hilar, or axillary lymph nodes. Thyroid gland, trachea, and esophagus demonstrate no significant findings. Lungs/Pleura: Very mild atelectasis is seen within the posterior aspects of the bilateral lower lobes. There is no evidence of acute infiltrate, pleural effusion or pneumothorax. Upper Abdomen: Surgical clips are seen within the gallbladder fossa. Musculoskeletal: Multilevel degenerative changes seen throughout the thoracic spine. Review of the MIP images confirms the above findings. IMPRESSION: 1.   No evidence of pulmonary embolism. 2.   Mild bilateral lower lobe atelectasis. 3.   Evidence of prior cholecystectomy. 4. Aortic atherosclerosis. Aortic Atherosclerosis (ICD10-I70.0). Electronically Signed   By: Virgina Norfolk M.D.   On: 02/11/2021 17:23   DG Chest Portable 1 View  Result Date: 02/11/2021 CLINICAL DATA:  Chest pain. EXAM: PORTABLE CHEST 1 VIEW COMPARISON:  Chest x-ray 05/09/2018. FINDINGS: Mediastinum and hilar structures normal. Heart size normal. No focal infiltrate. Low lung volumes with mild bibasilar atelectasis. No pleural effusion or pneumothorax. Surgical clips right upper quadrant. IMPRESSION: No acute cardiopulmonary disease. Low lung volumes with mild bibasilar atelectasis. Electronically Signed   By: Marcello Moores  Register   On: 02/11/2021 15:15    Cardiac Studies   Echo 02/02/21 1. Left ventricular ejection fraction, by estimation, is 65 to 70%. The  left ventricle has normal function. The left ventricle has no regional  wall motion abnormalities. Left ventricular diastolic parameters are  consistent with Grade I diastolic  dysfunction (impaired relaxation).  2. Right ventricular systolic function is normal. The right ventricular  size is mildly enlarged. There is normal pulmonary artery systolic  pressure.  3. Left atrial size was mild to moderately dilated.  4. The mitral valve is normal in structure. Mild mitral valve  regurgitation. No evidence of  mitral stenosis. Moderate mitral annular  calcification.  5. Tricuspid valve regurgitation is mild to moderate.  6. The aortic valve is normal in structure. Aortic valve regurgitation is  not visualized. No aortic stenosis is present.  7. The inferior vena cava is normal in size with greater than 50%  respiratory variability, suggesting right atrial pressure of 3 mmHg.   Patient Profile     80 y.o. female hx of HTN, HLD, mild mitral regurgitation, diabetes, PVD, COPD, IBS, hypothyroidism and chronic diastolic HF presented with syncope/cardiac arrest.   Assessment & Plan    1. Syncope/PEA arrest - Prior episode of fainting  requiring admission and found to be UTI, hypovolemic and hyponatremic.  - prodromal symptoms of feeling bed with smelling salt. Had sweating and nausea.  - brief pulseless and LOC after seeing Kerin Ransom in clinic yesterday>>brief compression and came back - Echo 02/02/21 with EF of 65-70% and grade 1DD - Elevated D-dimer>> CTA of chest without PE - Low cortisol>>pending ACTH per IM - Normal TSH but elevated free T4>> reduce synthroid dose - Hypokalemia resolved with supplement, sodium improving  -Tele without arrhythmias - PT recommending HHPT vs SNF  2. HTN - Antihypertensive held on admission - Felt dizzy when tried to sit on edge of bed.  - Check orthostatic  3. Hypothyroidism -  Normal TSH but elevated free T4 - Per IM  4. Low Cortisol - pending ACTH,  appreciate management per IM  5. Hyponatremia - improving with hold of Maxide  6. Hypokalemia - Resolved   For questions or updates, please contact [place] Please consult [email or web address] for contact info under        SignedLeanor Kail, PA  02/13/2021, 8:27 AM    Personally seen and examined. Agree with above.   Feeling a little bit better this morning, up in chair.  Worked with physical therapy, was able to perform the exercises as instructed.  Several questions  answered.  Still awaiting full cosyntropin stim test.  Possible adrenal insufficiency causing her recurrent syncopal episodes.  Avoiding triamterene hydrochlorothiazide.  Also contributing to her hyponatremia.  Candee Furbish, MD

## 2021-02-13 NOTE — Progress Notes (Signed)
PROGRESS NOTE    Christina Sellers  QMG:[id number] DOB: 16-Nov-1941 DOA: 02/11/2021 PCP: Josetta Huddle, MD   Brief Narrative: 80 year old with past medical history significant for mild mitral regurgitation, diabetes who presented to the ED after syncope episode at the cardiology clinic.  Patient has had 3 syncope episode over the last 2 weeks.  Patient had one episode that she lost consciousness, briefly she underwent chest compression, came to.  Felt to be pulseless initially.   Patient admitted for further evaluation of syncope/PEA arrest at  Cardiology office.  Was found to be hyponatremic with a sodium level at 121, cortisol level was low at 5.2.  CT angio negative for PE.  Assessment & Plan:   Active Problems:   Syncope   IBS (irritable bowel syndrome)   Hyponatremia   Syncope, unspecified syncope type   1-Syncope/PEA arrest: -Could be related to hypovolemia, versus vasovagal, Adrenal Insufficiency.  Cardiology following -Continue to monitor on telemetry. No arrhythmia.  -Continue with IV fluids -Orthostatic vitals today negative  -Cortisol level low, plan to do ACTH test.. Unable to perform test today, received medications but labs was not drawn. Discussed with nurses staff, we need close communication with phlebotomist.    2-Hyponatremia; -Suspect related to [facility] and recent history of diarrhea. -Continue to improve with IV fluids.  -Sodium increase to 131.  3-Hypertension:  Holding triamterene/hydrochlorothiazide and metoprolol due to hypotension. Will avoid indefinitely hydrochlorothiazide/triamterene SBP fluctuates, 110---150. continue to monitor of BP medications. Will follow recommendation from cardiologist in regards BP management. I would like to avoid Hypotension.   4-Hypothyroidism Free T4 mildly elevated.  I have reduce total dose of Synthroid to 125 mcg from 150  5-Hyperlipidemia: Continue with Crestor  6-Prolonged QT:  QT improved, down to   469.  7-Nausea, Diarrheahistory of IBS: Continue with  Carafate.   Report no BM in 3 days. Will try miralax.  She has had 3 episode of Diarrhea prior to syncope and after syncope events.  Her nausea, and GI presentation could be explain by adrenal insufficiency. If adrenal insufficiency is rule out, she might need GI evaluation.    8-Hypokalemia: Normalized. Will give 20 meq KCL times one.   9-Metabolic acidosis: Related to recent diarrhea illness: Improved with IV fluids 10-DM ; hold metformin.  11-? Chronic Adrenal Insufficiency.  -Low cortisol, hypotension, hyponatremia although not hyperkalemia. GI symptoms. No hyperpigmentation.  -I review MRI brain perform 2-27 with radiologist today, normal appearing for age pituitary gland, no mass or lesion on pituitary gland, although this was not a dedicated pituitary dedicated MRI.  -Radiologist also reviewed CT chest from this admission, adrenal glans appears unremarkable but again is limited view.  -Depending on ACTH test result,  I will discussed further with Endocrinologist.   Estimated body mass index is 25.6 kg/m as calculated from the following:   Height as of an earlier encounter on 02/11/21: 4\' 11"  (1.499 m).   Weight as of this encounter: 57.5 kg.   DVT prophylaxis: Lovenox Code Status: Full code Family Communication: Care discussed with patient and daughter was at bedside and second  Daughter over phone.  Disposition Plan:  Status is: Inpatient  Dispo: The patient is from: Home              Anticipated d/c is to: To be determined              Patient currently is not medically stable to d/c.   Difficult to place patient No  Consultants:   Cardiology   Procedures:   None  Antimicrobials:    Subjective: She is feeling better, less weak. She was surprise with herself , she was able to ambulate in the room. She would like to ambulate again today.  She had mild dizziness spell today.  She has been eating  better, she ate all her eggs for breakfast today.  Nausea has resolved.  She has not had BM in several days since admission.   Objective: Vitals:   02/13/21 0045 02/13/21 0736 02/13/21 0950 02/13/21 1413  BP: (!) 110/46 (!) 139/53 124/63 (!) 144/52  Pulse: 69 70 90 85  Resp: 14 13 15 13   Temp: 97.6 F (36.4 C)   97.8 F (36.6 C)  TempSrc: Axillary Oral  Oral  SpO2: 94% 99% 95% 100%  Weight:        Intake/Output Summary (Last 24 hours) at 02/13/2021 1644 Last data filed at 02/13/2021 1500 Gross per 24 hour  Intake 951.7 ml  Output 1000 ml  Net -48.3 ml   Filed Weights   02/12/21 0030  Weight: 57.5 kg    Examination:  General exam: NAD Respiratory system: CTA Cardiovascular system: S 1, S 2 RRR Gastrointestinal system: BS present, soft, nt Central nervous system: Alert, following command.  Extremities: no edema   Data Reviewed: I have personally reviewed following labs and imaging studies  CBC: Recent Labs  Lab 02/11/21 1321 02/12/21 0209 02/13/21 0700  WBC 9.7 7.5 5.9  NEUTROABS 7.5  --   --   HGB 11.8* 10.5* 10.5*  HCT 33.6* 30.1* 31.0*  MCV 95.5 95.9 97.8  PLT 221 191 175   Basic Metabolic Panel: Recent Labs  Lab 02/11/21 1321 02/11/21 1447 02/12/21 0209 02/12/21 0744 02/13/21 0700  NA  --  121*  --  126* 131*  K  --  3.3*  --  3.6 3.5  CL  --  88*  --  99 103  CO2  --  19*  --  21* 19*  GLUCOSE  --  122*  --  101* 87  BUN  --  19  --  13 13  CREATININE  --  0.88 0.94 0.86 0.72  CALCIUM  --  9.6  --  8.9 8.7*  MG 1.9  --   --  1.7  --    GFR: Estimated Creatinine Clearance: 42.6 mL/min (by C-G formula based on SCr of 0.72 mg/dL). Liver Function Tests: Recent Labs  Lab 02/11/21 1447  AST 24  ALT 17  ALKPHOS 44  BILITOT 1.0  PROT 6.6  ALBUMIN 4.0   No results for input(s): LIPASE, AMYLASE in the last 168 hours. No results for input(s): AMMONIA in the last 168 hours. Coagulation Profile: No results for input(s): INR, PROTIME in the  last 168 hours. Cardiac Enzymes: No results for input(s): CKTOTAL, CKMB, CKMBINDEX, TROPONINI in the last 168 hours. BNP (last 3 results) Recent Labs    01/21/21 1223  PROBNP 551   HbA1C: No results for input(s): HGBA1C in the last 72 hours. CBG: Recent Labs  Lab 02/12/21 0752 02/12/21 1230 02/12/21 1702 02/13/21 0735 02/13/21 1223  GLUCAP 105* 111* 106* 98 115*   Lipid Profile: No results for input(s): CHOL, HDL, LDLCALC, TRIG, CHOLHDL, LDLDIRECT in the last 72 hours. Thyroid Function Tests: Recent Labs    02/11/21 1447  TSH 1.151  FREET4 1.67*   Anemia Panel: No results for input(s): VITAMINB12, FOLATE, FERRITIN, TIBC, IRON, RETICCTPCT in the last 72 hours.  Sepsis Labs: No results for input(s): PROCALCITON, LATICACIDVEN in the last 168 hours.  Recent Results (from the past 240 hour(s))  SARS CORONAVIRUS 2 (TAT 6-24 HRS) Nasopharyngeal Nasopharyngeal Swab     Status: None   Collection Time: 02/11/21  1:27 PM   Specimen: Nasopharyngeal Swab  Result Value Ref Range Status   SARS Coronavirus 2 NEGATIVE NEGATIVE Final    Comment: (NOTE) SARS-CoV-2 target nucleic acids are NOT DETECTED.  The SARS-CoV-2 RNA is generally detectable in upper and lower respiratory specimens during the acute phase of infection. Negative results do not preclude SARS-CoV-2 infection, do not rule out co-infections with other pathogens, and should not be used as the sole basis for treatment or other patient management decisions. Negative results must be combined with clinical observations, patient history, and epidemiological information. The expected result is Negative.  Fact Sheet for Patients: SugarRoll.be  Fact Sheet for Healthcare Providers: https://www.woods-mathews.com/  This test is not yet approved or cleared by the [place] FDA and  has been authorized for detection and/or diagnosis of SARS-CoV-2 by FDA under an Emergency Use  Authorization (EUA). This EUA will remain  in effect (meaning this test can be used) for the duration of the COVID-19 declaration under Se ction 564(b)(1) of the Act, 21 U.S.C. section 360bbb-3(b)(1), unless the authorization is terminated or revoked sooner.  Performed at [facility], [place] [address], [place], [place] [address]          Radiology Studies: CT ANGIO CHEST PE W OR WO CONTRAST  Result Date: 02/11/2021 CLINICAL DATA:  Syncope. EXAM: CT ANGIOGRAPHY CHEST WITH CONTRAST TECHNIQUE: Multidetector CT imaging of the chest was performed using the standard protocol during bolus administration of intravenous contrast. Multiplanar CT image reconstructions and MIPs were obtained to evaluate the vascular anatomy. CONTRAST:  80mL OMNIPAQUE IOHEXOL 350 MG/ML SOLN COMPARISON:  None. FINDINGS: Cardiovascular: Moderate severity calcification of the aortic arch is seen. There is no evidence of aneurysmal dilatation or dissection. Satisfactory opacification of the pulmonary arteries to the segmental level. No evidence of pulmonary embolism. Normal heart size with mild to moderate severity coronary artery calcification. No pericardial effusion. Mediastinum/Nodes: No enlarged mediastinal, hilar, or axillary lymph nodes. Thyroid gland, trachea, and esophagus demonstrate no significant findings. Lungs/Pleura: Very mild atelectasis is seen within the posterior aspects of the bilateral lower lobes. There is no evidence of acute infiltrate, pleural effusion or pneumothorax. Upper Abdomen: Surgical clips are seen within the gallbladder fossa. Musculoskeletal: Multilevel degenerative changes seen throughout the thoracic spine. Review of the MIP images confirms the above findings. IMPRESSION: 1.   No evidence of pulmonary embolism. 2.   Mild bilateral lower lobe atelectasis. 3.   Evidence of prior cholecystectomy. 4. Aortic atherosclerosis. Aortic Atherosclerosis (ICD10-I70.0). Electronically Signed   By:  Virgina Norfolk M.D.   On: 02/11/2021 17:23        Scheduled Meds: . [START ON 02/14/2021] cosyntropin  0.25 mg Intravenous Once  . cycloSPORINE  1 drop Both Eyes BID  . enoxaparin (LOVENOX) injection  40 mg Subcutaneous Q24H  . hydroxypropyl methylcellulose / hypromellose  1 drop Both Eyes QHS  . insulin aspart  0-15 Units Subcutaneous TID WC  . [START ON 02/14/2021] levothyroxine  25 mcg Oral Once per day on Sun Tue Thu Sat   And  . levothyroxine  100 mcg Oral Once per day on Mon Wed Fri  . polyethylene glycol  17 g Oral Daily  . rosuvastatin  5 mg Oral Daily  .  sucralfate  1 g Oral TID WC & HS  . vitamin B-12  1,000 mcg Oral Daily   Continuous Infusions: . sodium chloride 75 mL/hr at 02/13/21 1344     LOS: 1 day    Time spent: 35 minutes    Belkys A Regalado, MD [facility]   If 7PM-7AM, please contact night-coverage [email or web address]  02/13/2021, 4:44 PM

## 2021-02-13 NOTE — Progress Notes (Addendum)
Physical Therapy Treatment Patient Details Name: Christina Sellers MRN: [id number] DOB: 06-Feb-1941 Today's Date: 02/13/2021    History of Present Illness Pt is an 80 y.o. female admitted 02/11/21 with syncope (3x episodes in the past 3 weeks), admitted for further evaluation of syncope with PEA arrest. Workup for UTI, hypovolemia, hyponatremia. PMH includes DM, CHF, PVD, HTN, IBS, gout.   PT Comments    Pt with much improved mobility compared to PT Evaluation yesterday. Pt able to transfer and ambulate throughout room with RW and min guard for balance. Daughter present and able to observe session; increased time discussing discharge recommendations and safety for return home. Recommend d/c home with HHPT services, daughter to provide initial assist; pt and daughter in agreement. Will continue to follow acutely.   Follow Up Recommendations  Home health PT;Supervision for mobility/OOB     Equipment Recommendations  3in1 (PT)    Recommendations for Other Services       Precautions / Restrictions Precautions Precautions: Fall Restrictions Weight Bearing Restrictions: No    Mobility  Bed Mobility Overal bed mobility: Modified Independent Bed Mobility: Supine to Sit     Supine to sit: Modified independent (Device/Increase time) Sit to supine: Min guard   General bed mobility comments: Increased time, use of bed rail    Transfers Overall transfer level: Needs assistance Equipment used: Rolling walker (2 wheeled) Transfers: Sit to/from Stand Sit to Stand: Min guard         General transfer comment: Multiple sit<>stands from bed, toilet and recliner to RW with min guard for balance  Ambulation/Gait Ambulation/Gait assistance: Min guard Gait Distance (Feet): 40 Feet Assistive device: Rolling walker (2 wheeled) Gait Pattern/deviations: Step-through pattern;Shuffle;Decreased stride length Gait velocity: Decreased   General Gait Details: Slow, mostly steady gait with RW and  min guard for balance; pt with shuffling gait pattern requiring cues to improve foot clearance (daughter reports this is pt's baseline). Further distance limited by fatigue   Stairs             Wheelchair Mobility    Modified Rankin (Stroke Patients Only)       Balance Overall balance assessment: Needs assistance Sitting-balance support: No upper extremity supported;Feet supported Sitting balance-Leahy Scale: Good     Standing balance support: Bilateral upper extremity supported Standing balance-Leahy Scale: Fair Standing balance comment: Can static stand without UE support; dynamic stability improved with RW                            Cognition Arousal/Alertness: Awake/alert Behavior During Therapy: WFL for tasks assessed/performed Overall Cognitive Status: Within Functional Limits for tasks assessed                                 General Comments: WFL for simple tasks, not formally assessed      Exercises Other Exercises Other Exercises: 10x sit<>stand from recliner with shoulder flexion upon standing (pt does this in chair yoga), seated LAQ    General Comments General comments (skin integrity, edema, etc.): Pt's daughter present and supportive; increased time discussing d/c recommendations - pt and daughter agreeable to home, daughter to provide initial assist. HR 83-125, SPO2 97% on RA; supine BP 147/54, sitting BP 154/64, post-mobility BP 124/63; pt denies dizziness with activity      Pertinent Vitals/Pain Pain Assessment: Faces Faces Pain Scale: Hurts a little bit Pain Location: R hip Pain  Descriptors / Indicators: Guarding;Constant Pain Intervention(s): Monitored during session    Home Living Family/patient expects to be discharged to:: Private residence Living Arrangements: Alone Available Help at Discharge: Family;Available PRN/intermittently Type of Home: House Home Access: Stairs to enter Entrance Stairs-Rails:  Right Home Layout: One level Home Equipment: Walker - 2 wheels;Grab bars - tub/shower Additional Comments: housekeeper who checks on her as well, has a small dog that she likes to walk    Prior Function Level of Independence: Needs assistance  Gait / Transfers Assistance Needed: Has had increased difficulty with ambulation secondary to R hip pain. ADL's / Homemaking Assistance Needed: Has required assist since last admission. Comments: Uses rolling walker due to arthritic rt hip   PT Goals (current goals can now be found in the care plan section) Acute Rehab PT Goals Patient Stated Goal: To go home Progress towards PT goals: Progressing toward goals    Frequency    Min 3X/week      PT Plan Discharge plan needs to be updated    Co-evaluation              AM-PAC PT "6 Clicks" Mobility   Outcome Measure  Help needed turning from your back to your side while in a flat bed without using bedrails?: None Help needed moving from lying on your back to sitting on the side of a flat bed without using bedrails?: None Help needed moving to and from a bed to a chair (including a wheelchair)?: A Little Help needed standing up from a chair using your arms (e.g., wheelchair or bedside chair)?: A Little Help needed to walk in hospital room?: A Little Help needed climbing 3-5 steps with a railing? : A Little 6 Click Score: 20    End of Session Equipment Utilized During Treatment: Gait belt Activity Tolerance: Patient tolerated treatment well Patient left: in chair;with call bell/phone within reach;with chair alarm set;with family/visitor present Nurse Communication: Mobility status PT Visit Diagnosis: Other abnormalities of gait and mobility (R26.89);Difficulty in walking, not elsewhere classified (R26.2);Pain Pain - Right/Left: Right Pain - part of body: Hip     Time: 7741-4239 PT Time Calculation (min) (ACUTE ONLY): 18 min  Charges:  $Therapeutic Exercise: 8-22 mins                     Mabeline Caras, PT, DPT Acute Rehabilitation Services  Pager [phone] Office [phone]  Derry Lory 02/13/2021, 12:32 PM

## 2021-02-13 NOTE — TOC Progression Note (Signed)
Transition of Care [facility]) - Progression Note    Patient Details  Name: Christina Sellers MRN: [id number] Date of Birth: 10-21-1941  Transition of [facility]) CM/SW Contact  Graves-Bigelow, Ocie Cornfield, RN Phone Number: 02/13/2021, 1:26 PM  Clinical Narrative:  Case Manager spoke with patient and her daughter, they have decided for the patient to return home with home health services- [facility] registered nurse and physical therapy- pt will need orders and F2F. Family wants a 3n1- referral sent to [employer] and will deliver to room prior to transition home. No further home needs identified.   Expected Discharge Plan: [place] Barriers to Discharge: No Barriers Identified  Expected Discharge Plan and Services Expected Discharge Plan: [place] In-house Referral: NA Discharge Planning Services: CM Consult Post Acute Care Choice: [place] arrangements for the past 2 months: Single Family Home                 DME Arranged: Bedside commode DME Agency: [employer] Date DME Agency Contacted: 02/13/21 Time DME Agency Contacted: [phone] Representative spoke with at DME Agency: Freda Munro [place]: RN,Disease Management,PT [facility] Agency: [place] Date [place]: 02/13/21 Time [place]: 63 Representative spoke with at [place]: Tommi Rumps   Readmission Risk Interventions No flowsheet data found.

## 2021-02-13 NOTE — Evaluation (Signed)
Occupational Therapy Evaluation Patient Details Name: Christina Sellers MRN: [id number] DOB: 1941/07/01 Today's Date: 02/13/2021    History of Present Illness Pt Christina Sellers who is 80 y/o Pt adm 02/11/21 with syncope (3 episodes in the past 3 weeks). admitted for further evaluation of syncope/PEA arrest by cardiology.  Was found to be hyponatremic . PMH - DM, chf, gout, pvd, htn, irritable bowel syndrome.   Clinical Impression   Pt is typically modified independent in ADL and mobility with RW (since August - pending hip sx with Dr Mayer Camel) enjoys travel and walking her dog as well as chair yoga. Today she is min guard for transfers, able to perform toilet transfer and peri care. Pt has had multiple syncopal episodes in shower - educated on water temperature to help with blood pressure as well as use of 3 in 1 as shower chair for safety and having supervision during bathing initially. Daughter [name] in agreement. Pt also able to complete sink level grooming. Pt will benefit from skilled OT in the acute setting as well as afterwards at the [facility] level to maximize safety and independence in ADL, fall risk, and preparation for upcoming hip sx.     Follow Up Recommendations  Home health OT;Supervision/Assistance - 24 hour (initially)    Equipment Recommendations  3 in 1 bedside commode    Recommendations for Other Services       Precautions / Restrictions Precautions Precautions: Fall Restrictions Weight Bearing Restrictions: No      Mobility Bed Mobility Overal bed mobility: Needs Assistance Bed Mobility: Supine to Sit;Sit to Supine     Supine to sit: Min guard Sit to supine: Min guard   General bed mobility comments: increased time but no physical assist needed    Transfers Overall transfer level: Needs assistance Equipment used: Rolling walker (2 wheeled) Transfers: Sit to/from Stand Sit to Stand: Min assist         General transfer comment: Min A for steadying balance  and safety initially but quickly progressed to min guard    Balance Overall balance assessment: Needs assistance Sitting-balance support: No upper extremity supported;Feet supported Sitting balance-Leahy Scale: Good     Standing balance support: Bilateral upper extremity supported Standing balance-Leahy Scale: Poor Standing balance comment: Reliant on BUE support                           ADL either performed or assessed with clinical judgement   ADL Overall ADL's : Needs assistance/impaired Eating/Feeding: Independent;Bed level   Grooming: Wash/dry hands;Min guard;Standing Grooming Details (indicate cue type and reason): sink level Upper Body Bathing: Min guard;Sitting   Lower Body Bathing: Min guard;Sitting/lateral leans   Upper Body Dressing : Min guard;Sitting   Lower Body Dressing: Minimal assistance;Sit to/from stand Lower Body Dressing Details (indicate cue type and reason): able to slide feet into slippers Toilet Transfer: Min guard;Ambulation;RW Toilet Transfer Details (indicate cue type and reason): initially min A for safety - but easily progressed to min guard Toileting- Clothing Manipulation and Hygiene: Set up;Sit to/from stand Toileting - Clothing Manipulation Details (indicate cue type and reason): educated on smart clothing options once home Tub/ Shower Transfer: Walk-in shower;Min guard;With caregiver independent assisting;3 in 1;Grab bars;Rolling walker Tub/Shower Transfer Details (indicate cue type and reason): daughter present and educated on shower safety and how t use 3 in1 Functional mobility during ADLs: Min guard;Rolling walker       Vision   Additional Comments: L black eye -  functionally was able to navigate the room and find items     Perception     Praxis      Pertinent Vitals/Pain Pain Assessment: Faces Faces Pain Scale: Hurts a little bit Pain Location: R hip Pain Descriptors / Indicators: Guarding Pain Intervention(s):  Limited activity within patient's tolerance;Monitored during session;Repositioned     Hand Dominance     Extremity/Trunk Assessment Upper Extremity Assessment Upper Extremity Assessment: Overall WFL for tasks assessed   Lower Extremity Assessment Lower Extremity Assessment: Defer to PT evaluation RLE Deficits / Details: R hip pain at baseline secondary to arthritis in hip   Cervical / Trunk Assessment Cervical / Trunk Assessment: Normal   Communication Communication Communication: HOH   Cognition Arousal/Alertness: Awake/alert Behavior During Therapy: WFL for tasks assessed/performed Overall Cognitive Status: Within Functional Limits for tasks assessed                                     General Comments  Pt's Daughter present throughout session; HR 83-125, 97% RA, sup 147/54, sit 154/64, post-walk 124/63    Exercises     Shoulder Instructions      Home Living Family/patient expects to be discharged to:: Private residence Living Arrangements: Alone Available Help at Discharge: Family;Available PRN/intermittently Type of Home: House Home Access: Stairs to enter [employer] of Steps: 2 Entrance Stairs-Rails: Right Home Layout: One level     Bathroom Shower/Tub: Occupational psychologist: Handicapped height     Home Equipment: Environmental consultant - 2 wheels;Grab bars - tub/shower   Additional Comments: housekeeper who checks on her as well, has a small dog that she likes to walk      Prior Functioning/Environment Level of Independence: Needs assistance  Gait / Transfers Assistance Needed: Has had increased difficulty with ambulation secondary to R hip pain. ADL's / Homemaking Assistance Needed: Has required assist since last admission.   Comments: Uses rolling walker due to arthritic rt hip        OT Problem List: Decreased activity tolerance;Impaired balance (sitting and/or standing);Decreased knowledge of use of DME or AE;Pain       OT Treatment/Interventions: Self-care/ADL training;Energy conservation;DME and/or AE instruction;Therapeutic activities;Patient/family education;Balance training    OT Goals(Current goals can be found in the care plan section) Acute Rehab OT Goals Patient Stated Goal: To go home OT Goal Formulation: With patient/family Time For Goal Achievement: 02/27/21 Potential to Achieve Goals: Good  OT Frequency: Min 2X/week   Barriers to D/C:            Co-evaluation              AM-PAC OT "6 Clicks" Daily Activity     Outcome Measure Help from another person eating meals?: None Help from another person taking care of personal grooming?: A Little Help from another person toileting, which includes using toliet, bedpan, or urinal?: A Little Help from another person bathing (including washing, rinsing, drying)?: A Lot Help from another person to put on and taking off regular upper body clothing?: A Little Help from another person to put on and taking off regular lower body clothing?: A Little 6 Click Score: 18   End of Session Equipment Utilized During Treatment: Gait belt;Rolling walker Nurse Communication: Mobility status;Precautions  Activity Tolerance: Patient tolerated treatment well Patient left: in chair;with call bell/phone within reach;with chair alarm set;with family/visitor present  OT Visit Diagnosis: Other abnormalities of gait  and mobility (R26.89);Repeated falls (R29.6);History of falling (Z91.81);Muscle weakness (generalized) (M62.81);Pain Pain - Right/Left: Right Pain - part of body: Hip                Time: 9678-9381 OT Time Calculation (min): 16 min Charges:  OT General Charges $OT Visit: 1 Visit OT Evaluation $OT Eval Moderate Complexity: [place] OTR/L Acute Rehabilitation Services Pager: [phone] Office: [place] 02/13/2021, 10:58 AM

## 2021-02-14 ENCOUNTER — Inpatient Hospital Stay (HOSPITAL_COMMUNITY): Payer: Medicare Other

## 2021-02-14 ENCOUNTER — Encounter (HOSPITAL_COMMUNITY): Payer: Self-pay | Admitting: Cardiology

## 2021-02-14 DIAGNOSIS — R55 Syncope and collapse: Secondary | ICD-10-CM

## 2021-02-14 DIAGNOSIS — R0989 Other specified symptoms and signs involving the circulatory and respiratory systems: Secondary | ICD-10-CM

## 2021-02-14 LAB — GLUCOSE, CAPILLARY
Glucose-Capillary: 122 mg/dL — ABNORMAL HIGH (ref 70–99)
Glucose-Capillary: 129 mg/dL — ABNORMAL HIGH (ref 70–99)
Glucose-Capillary: 95 mg/dL (ref 70–99)
Glucose-Capillary: 98 mg/dL (ref 70–99)

## 2021-02-14 LAB — BASIC METABOLIC PANEL
Anion gap: 7 (ref 5–15)
BUN: 17 mg/dL (ref 8–23)
CO2: 21 mmol/L — ABNORMAL LOW (ref 22–32)
Calcium: 9.3 mg/dL (ref 8.9–10.3)
Chloride: 108 mmol/L (ref 98–111)
Creatinine, Ser: 0.74 mg/dL (ref 0.44–1.00)
GFR, Estimated: >60 mL/min (ref >60)
Glucose, Bld: 99 mg/dL (ref 70–99)
Potassium: 3.9 mmol/L (ref 3.5–5.1)
Sodium: 136 mmol/L (ref 135–145)

## 2021-02-14 LAB — ACTH STIMULATION, 3 TIME POINTS
Cortisol, 30 Min: 22.5 ug/dL
Cortisol, 60 Min: 26.8 ug/dL
Cortisol, Base: 9.6 ug/dL

## 2021-02-14 MED ORDER — PSYLLIUM 95 % PO PACK
1.0000 | PACK | Freq: Every day | ORAL | Status: DC
  Filled 2021-02-14 (×2): qty 1

## 2021-02-14 MED ORDER — IOHEXOL 9 MG/ML PO SOLN
ORAL | Status: AC
  Administered 2021-02-14: 500 mL via ORAL
  Filled 2021-02-14: qty 1000

## 2021-02-14 MED ORDER — IOHEXOL 9 MG/ML PO SOLN
500.0000 mL | ORAL | Status: AC
  Administered 2021-02-14: 500 mL via ORAL

## 2021-02-14 MED ORDER — IOHEXOL 300 MG/ML  SOLN
100.0000 mL | Freq: Once | INTRAMUSCULAR | Status: AC | PRN
  Administered 2021-02-14: 100 mL via INTRAVENOUS

## 2021-02-14 NOTE — Progress Notes (Signed)
PT Cancellation Note  Patient Details Name: Asucena Galer MRN: [id number] DOB: December 01, 1941   Cancelled Treatment:    Reason Eval/Treat Not Completed: Patient at procedure or test/unavailable (getting ready to go to CT scan and drinking contrast)  Encouraged pt to ambulating with nursing after CT scan.    Shanna Cisco 02/14/2021, 4:38 PM

## 2021-02-14 NOTE — Progress Notes (Addendum)
PROGRESS NOTE    Kjersten Ormiston  WEX:[id number] DOB: Jan 25, 1941 DOA: 02/11/2021 PCP: Josetta Huddle, MD   Brief Narrative: 80 year old with past medical history significant for mild mitral regurgitation, diabetes who presented to the ED after syncope episode at the cardiology clinic.  Patient has had 3 syncope episode over the last 2 weeks.  Patient had one episode that she lost consciousness, briefly she underwent chest compression, came to.  Felt to be pulseless initially.   Patient admitted for further evaluation of syncope/PEA arrest at  Cardiology office.  Was found to be hyponatremic with a sodium level at 121, cortisol level was low at 5.2.  CT angio negative for PE.  Assessment & Plan:   Active Problems:   Syncope   IBS (irritable bowel syndrome)   Hyponatremia   Syncope, unspecified syncope type   1-Syncope/PEA arrest: -Vaso-Vagal Syncope episode related to Dehydration, Hypovolemia in setting of diuretics, Diarrheal illness.  -Continue to monitor on telemetry. No arrhythmia.  -Plan to discontinue fluids, monitor on oral intake.  -Orthostatic vitals  negative  -Adrenal insufficiency was ruled out with normal ACTH test, discussed with [place] for carotid doppler.  -Discussed with Neurologist on call today, MRI last admission negative for stroke, patient recover after event, no confusion, no tongue bite or urinary incontinence unlikely seizures.  -Appreciate cardiology evaluation, patient likely had Vaso-vagal syncope.   2-Hyponatremia; -Suspect related to [facility] and recent history of diarrhea. -Improved with IV fluids.  -Sodium normalized.   3-Hypertension:  Holding triamterene/hydrochlorothiazide and metoprolol due to hypotension. Will avoid indefinitely hydrochlorothiazide/triamterene Monitor BP, BP fluctuates.   4-Hypothyroidism Free T4 mildly elevated.  I have reduce total dose of Synthroid to 125 mcg from 150  5-Hyperlipidemia:  Continue with Crestor  6-Prolonged QT:  QT improved, down to  469.  7-Nausea, Diarrhea, history of IBS: Continue with  Carafate.   Reported Constipation. On miralax.  Report a history of intermittent Diarrhea and constipation.  GI has been consulted, plan to get CT abdomen pelvis to further evaluate.   8-Hypokalemia: Normalized.   9-Metabolic acidosis: Related to recent diarrhea illness: Improved with IV fluids 10-DM ; hold metformin.  11-Adrenal Insufficiency was rule out.  12-Bilateral LE edema; TED hose, elevate legs. Stop IV fluids.  13-nutrition consult.   Estimated body mass index is 27.65 kg/m as calculated from the following:   Height as of an earlier encounter on 02/11/21: 4\' 11"  (1.499 m).   Weight as of this encounter: 62.1 kg.   DVT prophylaxis: Lovenox Code Status: Full code Family Communication: Care discussed with patient and daughter was at bedside.   Disposition Plan:  Status is: Inpatient  Dispo: The patient is from: Home              Anticipated d/c is to: To be determined              Patient currently is not medically stable to d/c.   Difficult to place patient No        Consultants:   Cardiology   Procedures:   None  Antimicrobials:    Subjective: She has not had a Bowel movement yet.  She report LE edema, worse last night. He LE edema gets worse when she is standing on her feet or sitting with her legs down.  She relates today; first syncope episode was preceded by days of Diarrhea. Second Syncope episode she nausea and vomiting after. Last episode she didn't had diarrhea.   Objective: Vitals:  02/13/21 1413 02/13/21 1653 02/13/21 2034 02/14/21 0503  BP: (!) 144/52 (!) 155/69 (!) 145/54 (!) 162/54  Pulse: 85  67 84  Resp: 13 15 16 17   Temp: 97.8 F (36.6 C)  97.7 F (36.5 C) 97.9 F (36.6 C)  TempSrc: Oral  Oral Oral  SpO2: 100%  100% 100%  Weight:    62.1 kg    Intake/Output Summary (Last 24 hours) at 02/14/2021 1122 Last  data filed at 02/14/2021 9622 Gross per 24 hour  Intake 853.61 ml  Output 700 ml  Net 153.61 ml   Filed Weights   02/12/21 0030 02/14/21 0503  Weight: 57.5 kg 62.1 kg    Examination:  General exam: NAD Respiratory system: CTA Cardiovascular system: S 1, S 2 RRR Gastrointestinal system: BS present, soft, nt Central nervous system: alert Extremities: plus 1 edema, no calf tenderness   Data Reviewed: I have personally reviewed following labs and imaging studies  CBC: Recent Labs  Lab 02/11/21 1321 02/12/21 0209 02/13/21 0700  WBC 9.7 7.5 5.9  NEUTROABS 7.5  --   --   HGB 11.8* 10.5* 10.5*  HCT 33.6* 30.1* 31.0*  MCV 95.5 95.9 97.8  PLT 221 191 297   Basic Metabolic Panel: Recent Labs  Lab 02/11/21 1321 02/11/21 1447 02/12/21 0209 02/12/21 0744 02/13/21 0700 02/14/21 0459  NA  --  121*  --  126* 131* 136  K  --  3.3*  --  3.6 3.5 3.9  CL  --  88*  --  99 103 108  CO2  --  19*  --  21* 19* 21*  GLUCOSE  --  122*  --  101* 87 99  BUN  --  19  --  13 13 17   CREATININE  --  0.88 0.94 0.86 0.72 0.74  CALCIUM  --  9.6  --  8.9 8.7* 9.3  MG 1.9  --   --  1.7  --   --    GFR: Estimated Creatinine Clearance: 44.2 mL/min (by C-G formula based on SCr of 0.74 mg/dL). Liver Function Tests: Recent Labs  Lab 02/11/21 1447  AST 24  ALT 17  ALKPHOS 44  BILITOT 1.0  PROT 6.6  ALBUMIN 4.0   No results for input(s): LIPASE, AMYLASE in the last 168 hours. No results for input(s): AMMONIA in the last 168 hours. Coagulation Profile: No results for input(s): INR, PROTIME in the last 168 hours. Cardiac Enzymes: No results for input(s): CKTOTAL, CKMB, CKMBINDEX, TROPONINI in the last 168 hours. BNP (last 3 results) Recent Labs    01/21/21 1223  PROBNP 551   HbA1C: No results for input(s): HGBA1C in the last 72 hours. CBG: Recent Labs  Lab 02/13/21 0735 02/13/21 1223 02/13/21 1630 02/13/21 2116 02/14/21 0823  GLUCAP 98 115* 105* 132* 122*   Lipid  Profile: No results for input(s): CHOL, HDL, LDLCALC, TRIG, CHOLHDL, LDLDIRECT in the last 72 hours. Thyroid Function Tests: Recent Labs    02/11/21 1447  TSH 1.151  FREET4 1.67*   Anemia Panel: No results for input(s): VITAMINB12, FOLATE, FERRITIN, TIBC, IRON, RETICCTPCT in the last 72 hours. Sepsis Labs: No results for input(s): PROCALCITON, LATICACIDVEN in the last 168 hours.  Recent Results (from the past 240 hour(s))  SARS CORONAVIRUS 2 (TAT 6-24 HRS) Nasopharyngeal Nasopharyngeal Swab     Status: None   Collection Time: 02/11/21  1:27 PM   Specimen: Nasopharyngeal Swab  Result Value Ref Range Status   SARS Coronavirus 2 NEGATIVE  NEGATIVE Final    Comment: (NOTE) SARS-CoV-2 target nucleic acids are NOT DETECTED.  The SARS-CoV-2 RNA is generally detectable in upper and lower respiratory specimens during the acute phase of infection. Negative results do not preclude SARS-CoV-2 infection, do not rule out co-infections with other pathogens, and should not be used as the sole basis for treatment or other patient management decisions. Negative results must be combined with clinical observations, patient history, and epidemiological information. The expected result is Negative.  Fact Sheet for Patients: SugarRoll.be  Fact Sheet for Healthcare Providers: https://www.woods-mathews.com/  This test is not yet approved or cleared by the [place] FDA and  has been authorized for detection and/or diagnosis of SARS-CoV-2 by FDA under an Emergency Use Authorization (EUA). This EUA will remain  in effect (meaning this test can be used) for the duration of the COVID-19 declaration under Se ction 564(b)(1) of the Act, 21 U.S.C. section 360bbb-3(b)(1), unless the authorization is terminated or revoked sooner.  Performed at [facility], [place] [address], [place], [place] [address]          Radiology Studies: No results  found.      Scheduled Meds: . cycloSPORINE  1 drop Both Eyes BID  . enoxaparin (LOVENOX) injection  40 mg Subcutaneous Q24H  . hydroxypropyl methylcellulose / hypromellose  1 drop Both Eyes QHS  . insulin aspart  0-15 Units Subcutaneous TID WC  . levothyroxine  25 mcg Oral Once per day on Sun Tue Thu Sat   And  . levothyroxine  100 mcg Oral Once per day on Mon Wed Fri  . polyethylene glycol  17 g Oral Daily  . rosuvastatin  5 mg Oral Daily  . sucralfate  1 g Oral TID WC & HS  . vitamin B-12  1,000 mcg Oral Daily   Continuous Infusions:    LOS: 2 days    Time spent: 35 minutes    Armine Rizzolo A Magdeline Prange, MD [facility]   If 7PM-7AM, please contact night-coverage [email or web address]  02/14/2021, 11:22 AM

## 2021-02-14 NOTE — Progress Notes (Signed)
VASCULAR LAB    Carotid duplex has been performed.  See CV proc for preliminary results.   Judit Awad, RVT 02/14/2021, 4:38 PM

## 2021-02-14 NOTE — Progress Notes (Signed)
Cardiology Progress Note  Patient ID: Christina Sellers MRN: [id number] DOB: 1941/07/02 Date of Encounter: 02/14/2021  Primary Cardiologist: Werner Lean, MD  Subjective   Chief Complaint: Weakness/fatigue  HPI: Admitted with recurrent syncope.  Reports she is weak and fatigued.  Oral intake is increased.  Sodium has improved.  ROS:  All other ROS reviewed and negative. Pertinent positives noted in the HPI.     Inpatient Medications  Scheduled Meds: . cycloSPORINE  1 drop Both Eyes BID  . enoxaparin (LOVENOX) injection  40 mg Subcutaneous Q24H  . hydroxypropyl methylcellulose / hypromellose  1 drop Both Eyes QHS  . insulin aspart  0-15 Units Subcutaneous TID WC  . levothyroxine  25 mcg Oral Once per day on Sun Tue Thu Sat   And  . levothyroxine  100 mcg Oral Once per day on Mon Wed Fri  . polyethylene glycol  17 g Oral Daily  . rosuvastatin  5 mg Oral Daily  . sucralfate  1 g Oral TID WC & HS  . vitamin B-12  1,000 mcg Oral Daily   Continuous Infusions: . sodium chloride 75 mL/hr at 02/14/21 0816   PRN Meds: acetaminophen, diclofenac Sodium, fluticasone, methocarbamol, nitroGLYCERIN, ondansetron   Vital Signs   Vitals:   02/13/21 1413 02/13/21 1653 02/13/21 2034 02/14/21 0503  BP: (!) 144/52 (!) 155/69 (!) 145/54 (!) 162/54  Pulse: 85  67 84  Resp: 13 15 16 17   Temp: 97.8 F (36.6 C)  97.7 F (36.5 C) 97.9 F (36.6 C)  TempSrc: Oral  Oral Oral  SpO2: 100%  100% 100%  Weight:    62.1 kg    Intake/Output Summary (Last 24 hours) at 02/14/2021 1022 Last data filed at 02/14/2021 0300 Gross per 24 hour  Intake 853.61 ml  Output 700 ml  Net 153.61 ml   Last 3 Weights 02/14/2021 02/12/2021 02/11/2021  Weight (lbs) 136 lb 14.5 oz 126 lb 12.2 oz 125 lb  Weight (kg) 62.1 kg 57.5 kg 56.7 kg      Telemetry  Overnight telemetry shows SR 70s, which I personally reviewed.   ECG  The most recent ECG shows sinus rhythm, inferior infarct, nonspecific ST-T changes,  which I personally reviewed.   Physical Exam   Vitals:   02/13/21 1413 02/13/21 1653 02/13/21 2034 02/14/21 0503  BP: (!) 144/52 (!) 155/69 (!) 145/54 (!) 162/54  Pulse: 85  67 84  Resp: 13 15 16 17   Temp: 97.8 F (36.6 C)  97.7 F (36.5 C) 97.9 F (36.6 C)  TempSrc: Oral  Oral Oral  SpO2: 100%  100% 100%  Weight:    62.1 kg     Intake/Output Summary (Last 24 hours) at 02/14/2021 1022 Last data filed at 02/14/2021 9233 Gross per 24 hour  Intake 853.61 ml  Output 700 ml  Net 153.61 ml    Last 3 Weights 02/14/2021 02/12/2021 02/11/2021  Weight (lbs) 136 lb 14.5 oz 126 lb 12.2 oz 125 lb  Weight (kg) 62.1 kg 57.5 kg 56.7 kg    Body mass index is 27.65 kg/m.   General: Well nourished, well developed, in no acute distress Head: Atraumatic, normal size  Eyes: PEERLA, EOMI  Neck: Supple, no JVD Endocrine: No thryomegaly Cardiac: Normal S1, S2; RRR; faint 2 out of 6 systolic ejection murmur Lungs: Clear to auscultation bilaterally, no wheezing, rhonchi or rales  Abd: Soft, nontender, no hepatomegaly  Ext: No edema, pulses 2+ Musculoskeletal: No deformities, BUE and BLE strength normal and equal  Skin: Warm and dry, no rashes   Neuro: Alert and oriented to person, place, time, and situation, CNII-XII grossly intact, no focal deficits  Psych: Normal mood and affect   Labs  High Sensitivity Troponin:   Recent Labs  Lab 02/01/21 2200 02/11/21 1321 02/12/21 0209  TROPONINIHS 10 9 10      Cardiac EnzymesNo results for input(s): TROPONINI in the last 168 hours. No results for input(s): TROPIPOC in the last 168 hours.  Chemistry Recent Labs  Lab 02/11/21 1447 02/12/21 0209 02/12/21 0744 02/13/21 0700 02/14/21 0459  NA 121*  --  126* 131* 136  K 3.3*  --  3.6 3.5 3.9  CL 88*  --  99 103 108  CO2 19*  --  21* 19* 21*  GLUCOSE 122*  --  101* 87 99  BUN 19  --  13 13 17   CREATININE 0.88   < > 0.86 0.72 0.74  CALCIUM 9.6  --  8.9 8.7* 9.3  PROT 6.6  --   --   --   --    ALBUMIN 4.0  --   --   --   --   AST 24  --   --   --   --   ALT 17  --   --   --   --   ALKPHOS 44  --   --   --   --   BILITOT 1.0  --   --   --   --   GFRNONAA >60   < > >60 >60 >60  ANIONGAP 14  --  6 9 7    < > = values in this interval not displayed.    Hematology Recent Labs  Lab 02/11/21 1321 02/12/21 0209 02/13/21 0700  WBC 9.7 7.5 5.9  RBC 3.52* 3.14* 3.17*  HGB 11.8* 10.5* 10.5*  HCT 33.6* 30.1* 31.0*  MCV 95.5 95.9 97.8  MCH 33.5 33.4 33.1  MCHC 35.1 34.9 33.9  RDW 12.1 12.1 12.6  PLT 221 191 205   BNPNo results for input(s): BNP, PROBNP in the last 168 hours.  DDimer  Recent Labs  Lab 02/11/21 1447  DDIMER 1.05*     Radiology  No results found.  Cardiac Studies  TTE 02/02/2021 1. Left ventricular ejection fraction, by estimation, is 65 to 70%. The  left ventricle has normal function. The left ventricle has no regional  wall motion abnormalities. Left ventricular diastolic parameters are  consistent with Grade I diastolic  dysfunction (impaired relaxation).  2. Right ventricular systolic function is normal. The right ventricular  size is mildly enlarged. There is normal pulmonary artery systolic  pressure.  3. Left atrial size was mild to moderately dilated.  4. The mitral valve is normal in structure. Mild mitral valve  regurgitation. No evidence of mitral stenosis. Moderate mitral annular  calcification.  5. Tricuspid valve regurgitation is mild to moderate.  6. The aortic valve is normal in structure. Aortic valve regurgitation is  not visualized. No aortic stenosis is present.  7. The inferior vena cava is normal in size with greater than 50%  respiratory variability, suggesting right atrial pressure of 3 mmHg.   Patient Profile  Christina Sellers is a 80 y.o. female with hypertension, diabetes, mention of carotid artery disease (no report), irritable bowel syndrome, hypothyroidism who was admitted on 02/11/2021 with what appears to be  recurrent vasovagal syncope.  Assessment & Plan   1.  Recurrent vasovagal syncope -She is had 3 episodes from  what she tells me as she gets a warm sensation with a Blinder going over her vision.  She can feel nauseated as well.  She has had one episode where she felt like she was going to pass out was able to circumvent this by sitting down.  She has had a second syncopal event.  She also had a third syncopal event in our office.  She had similar symptoms with a prodrome of blurry vision as well as a warm sensation.  She is also had nausea and diarrhea. -EKG shows sinus rhythm.  No arrhythmias here.  Echocardiogram shows normal LV function.  She has a murmur of aortic sclerosis.  Troponins negative.   -She had no chest pain or shortness of breath to suggest underlying cardiac pathology prior to these events.  There is no seizure-like activity.  She comes back relatively quickly. -She was found to have hyponatremia and hypokalemia.  She apparently was on a thiazide diuretic.  This clearly could have contributed.  She also endorses a history of poor p.o. intake.  Overall, I feel these are recurrent vasovagal episodes in the setting of dehydration.  I see no need for further cardiac work-up.  Hospital medicine is working her up for possible adrenal insufficiency.  I think this is just dehydration. -TSH is normal but free T4 was a bit elevated.  This is being corrected by hospital medicine.  She does not appear to be having hypoglycemic events. -There is mention of carotid artery stenosis.  She does have a faint murmur on exam and bruit.  We will recheck a vascular ultrasound. -Regarding her diet.  I think a heart healthy salt restricted diet is okay.  As long as she is well hydrated and off thiazide her potassium will not drop.  I have also recommended physical therapy.  We need to get her moving around. -Also of note, she reports that she was told by her primary care physician that she was told by her  primary care physician that she was prediabetic.  She is modified her diet.  She is also lost weight.  I suspect this clearly contributed. -Overall, this appears to be multifactorial which is most likely primarily driven by dehydration as detailed above.  We have asked hospital medicine to take over his primary on this patient.  They have agreed given that her cardiac work-up is negative.  Cardiology will follow along for consultative purposes.  For questions or updates, please contact [place] Please consult [email or web address] for contact info under   Time Spent with Patient: I have spent a total of 25 minutes with patient reviewing hospital notes, telemetry, EKGs, labs and examining the patient as well as establishing an assessment and plan that was discussed with the patient.  > 50% of time was spent in direct patient care.    Signed, Addison Naegeli. Audie Box, MD, [place]  02/14/2021 10:22 AM

## 2021-02-15 ENCOUNTER — Encounter (HOSPITAL_COMMUNITY): Payer: Self-pay | Admitting: Cardiology

## 2021-02-15 ENCOUNTER — Inpatient Hospital Stay (HOSPITAL_COMMUNITY): Payer: Medicare Other

## 2021-02-15 DIAGNOSIS — M7989 Other specified soft tissue disorders: Secondary | ICD-10-CM

## 2021-02-15 DIAGNOSIS — R55 Syncope and collapse: Secondary | ICD-10-CM | POA: Diagnosis not present

## 2021-02-15 LAB — GLUCOSE, CAPILLARY
Glucose-Capillary: 79 mg/dL (ref 70–99)
Glucose-Capillary: 130 mg/dL — ABNORMAL HIGH (ref 70–99)
Glucose-Capillary: 71 mg/dL (ref 70–99)
Glucose-Capillary: 109 mg/dL — ABNORMAL HIGH (ref 70–99)
Glucose-Capillary: 80 mg/dL (ref 70–99)

## 2021-02-15 LAB — MAGNESIUM: Magnesium: 1.9 mg/dL (ref 1.7–2.4)

## 2021-02-15 LAB — BASIC METABOLIC PANEL
Anion gap: 8 (ref 5–15)
BUN: 14 mg/dL (ref 8–23)
CO2: 19 mmol/L — ABNORMAL LOW (ref 22–32)
Calcium: 8.8 mg/dL — ABNORMAL LOW (ref 8.9–10.3)
Chloride: 106 mmol/L (ref 98–111)
Creatinine, Ser: 0.69 mg/dL (ref 0.44–1.00)
GFR, Estimated: >60 mL/min (ref >60)
Glucose, Bld: 81 mg/dL (ref 70–99)
Potassium: 4.6 mmol/L (ref 3.5–5.1)
Sodium: 133 mmol/L — ABNORMAL LOW (ref 135–145)

## 2021-02-15 LAB — CBC
HCT: 30.9 % — ABNORMAL LOW (ref 36.0–46.0)
Hemoglobin: 10.7 g/dL — ABNORMAL LOW (ref 12.0–15.0)
MCH: 34 pg (ref 26.0–34.0)
MCHC: 34.6 g/dL (ref 30.0–36.0)
MCV: 98.1 fL (ref 80.0–100.0)
Platelets: 197 10*3/uL (ref 150–400)
RBC: 3.15 MIL/uL — ABNORMAL LOW (ref 3.87–5.11)
RDW: 13.3 % (ref 11.5–15.5)
WBC: 7.8 10*3/uL (ref 4.0–10.5)
nRBC: 0 % (ref 0.0–0.2)

## 2021-02-15 LAB — VITAMIN B12: Vitamin B-12: 749 pg/mL (ref 180–914)

## 2021-02-15 MED ORDER — SODIUM CHLORIDE 0.9 % IV SOLN: INTRAVENOUS | Status: DC

## 2021-02-15 MED ORDER — ENSURE ENLIVE PO LIQD
237.0000 mL | Freq: Two times a day (BID) | ORAL | Status: DC
  Administered 2021-02-15 – 2021-02-16 (×3): 237 mL via ORAL

## 2021-02-15 MED ORDER — POLYETHYLENE GLYCOL 3350 17 G PO PACK: 17.0000 g | PACK | Freq: Every day | ORAL | Status: DC | PRN

## 2021-02-15 NOTE — Progress Notes (Signed)
Patient complained of severe lightheadedness around 1530 and feeling "very sick" and "off". Her BP was WNL, blood sugar was 71.   I offered the patient a cup of OJ to see if it would help r/t slightly low blood sugar and 10 minutes later the patient stated she now felt fine and blood sugar was 109.

## 2021-02-15 NOTE — Progress Notes (Signed)
Physical Therapy Treatment Patient Details Name: Christina Sellers MRN: [id number] DOB: 01-08-1941 Today's Date: 02/15/2021    History of Present Illness Pt is an 80 y.o. female admitted 02/11/21 with syncope (3x episodes in the past 3 peaks), admitted for further evaluation of syncope with PEA arrest. Workup for UTI, hypovolemia, hyponatremia. PMH includes DM, CHF, PVD, HTN, IBS, gout.    PT Comments    Pt tolerates treatment well, ambulating for household distances without loss of balance. Pt denies symptoms of dizziness or lightheadedness during session and is eager for further exercise and to return to baseline. Pt performs the 5x sit to stand test in 12.03 seconds, below the average for her age group. Pt will benefit from continued acute PT POC to progress dynamic balance and to ambulate without an assistive device. PT continues to recommend discharge home with HHPT.   Follow Up Recommendations  Home health PT;Supervision - Intermittent     Equipment Recommendations  3in1 (PT)    Recommendations for Other Services       Precautions / Restrictions Precautions Precautions: Fall Precaution Comments: monitor vitals, dizziness/syncope Restrictions Weight Bearing Restrictions: No    Mobility  Bed Mobility Overal bed mobility: Modified Independent             General bed mobility comments: increased time    Transfers Overall transfer level: Needs assistance Equipment used: None Transfers: Sit to/from Stand Sit to Stand: Supervision         General transfer comment: 5x sit to stand in 12.02 seconds  Ambulation/Gait Ambulation/Gait assistance: Supervision Gait Distance (Feet): 200 Feet Assistive device: Rolling walker (2 wheeled) Gait Pattern/deviations: Step-through pattern Gait velocity: functional Gait velocity interpretation: 1.31 - 2.62 ft/sec, indicative of limited community ambulator General Gait Details: pt with steady step-through gait no LOB  noted   Stairs             Wheelchair Mobility    Modified Rankin (Stroke Patients Only)       Balance Overall balance assessment: Needs assistance Sitting-balance support: No upper extremity supported;Feet supported Sitting balance-Leahy Scale: Good     Standing balance support: No upper extremity supported Standing balance-Leahy Scale: Fair                              Cognition   Behavior During Therapy: WFL for tasks assessed/performed Overall Cognitive Status: Within Functional Limits for tasks assessed                                 General Comments: WFL for simple tasks, not formally assessed      Exercises      General Comments General comments (skin integrity, edema, etc.): VSS on RA, daughter present and supportive      Pertinent Vitals/Pain Pain Assessment: No/denies pain    Home Living                      Prior Function            PT Goals (current goals can now be found in the care plan section) Acute Rehab PT Goals Patient Stated Goal: To go home Progress towards PT goals: Progressing toward goals    Frequency    Min 3X/week      PT Plan Current plan remains appropriate    Co-evaluation  AM-PAC PT "6 Clicks" Mobility   Outcome Measure  Help needed turning from your back to your side while in a flat bed without using bedrails?: None Help needed moving from lying on your back to sitting on the side of a flat bed without using bedrails?: None Help needed moving to and from a bed to a chair (including a wheelchair)?: A Little Help needed standing up from a chair using your arms (e.g., wheelchair or bedside chair)?: A Little Help needed to walk in hospital room?: A Little Help needed climbing 3-5 steps with a railing? : A Little 6 Click Score: 20    End of Session   Activity Tolerance: Patient tolerated treatment well Patient left: in bed;with call bell/phone within  reach;with family/visitor present Nurse Communication: Mobility status PT Visit Diagnosis: Other abnormalities of gait and mobility (R26.89);Difficulty in walking, not elsewhere classified (R26.2);Pain     Time: 0104-0459 PT Time Calculation (min) (ACUTE ONLY): 31 min  Charges:  $Gait Training: 8-22 mins $Therapeutic Activity: 8-22 mins                     Zenaida Niece, PT, DPT Acute Rehabilitation Pager: [phone]    Zenaida Niece 02/15/2021, 5:05 PM

## 2021-02-15 NOTE — Progress Notes (Signed)
Bilateral lower extremity venous study completed.      Please see CV Proc for preliminary results.   Aristidis Talerico, RVT  

## 2021-02-15 NOTE — Progress Notes (Addendum)
PROGRESS NOTE    Christina Sellers  BTD:[id number] DOB: [birth year] DOA: 02/11/2021 PCP: Josetta Huddle, MD   Brief Narrative: 80 year old with past medical history significant for mild mitral regurgitation, diabetes who presented to the ED after syncope episode at the cardiology clinic.  Patient has had 3 syncope episode over the last 2 weeks.  Patient had one episode that she lost consciousness, briefly she underwent chest compression, came to.  Felt to be pulseless initially.   Patient admitted for further evaluation of syncope/PEA arrest at  Cardiology office.  Was found to be hyponatremic with a sodium level at 121, cortisol level was low at 5.2.  CT angio negative for PE.  Assessment & Plan:   Active Problems:   Syncope   IBS (irritable bowel syndrome)   Hyponatremia   Syncope, unspecified syncope type   1-Syncope/PEA arrest: -Vaso-Vagal Syncope episode related to Dehydration, Hypovolemia in setting of diuretics, Diarrheal illness.  -Continue to monitor on telemetry. No arrhythmia.  -Orthostatic vitals  negative  -Adrenal insufficiency was ruled out with normal ACTH test.  -Carotid doppler: right 40--59 % stenosis. Continue with crestor. Does not explain syncope.  -Discussed with Neurologist 3-12: MRI last admission negative for stroke, patient recover after event, no confusion, no tongue bite or urinary incontinence unlikely seizures.  -Appreciate cardiology evaluation, patient likely had Vaso-vagal syncope.  -Treated with IV fluids.   2-Hyponatremia; -Suspect related to [facility] and recent history of diarrhea. -Improved with IV fluids.  -Sodium down to 133-- plan to repeat tomorrow. Repeat orthostatic.   3-Hypertension:  Holding triamterene/hydrochlorothiazide, Norvasc  metoprolol due to hypotension. Will avoid indefinitely hydrochlorothiazide/triamterene Monitor BP, BP fluctuates.   4-Hypothyroidism Free T4 mildly elevated.  I have reduce total dose of Synthroid to 125  mcg from 150  5-Hyperlipidemia: Continue with Crestor.  6-Prolonged QT:  QT improved, down to  469.  7-Nausea, Diarrhea, history of IBS: Continue with  Carafate.   Reported Constipation. On miralax.  Report a history of intermittent Diarrhea and constipation.  GI has been consulted, follow up out patient.  CT abdomen pelvis; only shows diverticulosis.   8-Hypokalemia: Normalized.   9-Metabolic acidosis: Related to recent diarrhea illness: Improved with IV fluids 10-DM ; hold metformin.  11-Adrenal Insufficiency was rule out.  12-Bilateral LE edema; TED hose, elevate legs. Stop IV fluids. Check dopplers. Preliminary report negative for DVT 13-nutrition consulted.  14-Night sweats:  She report a history of night swats after steroid injection. Think is getting less frequents.  -Her synthroid was recently adjusted. Monitor.  -No enlarge abdominal or pelvis Lymph nodes.  No enlarge Lymph node on recent CT angi neither.  -No fever, or leukocytosis this admission.  -If persist will need further evaluation by PCP.   15-Diphtheroids Blood culture: 1 of 2 Blood culture. Discussed with ID that was likely a contaminate/   Estimated body mass index is 26.46 kg/m as calculated from the following:   Height as of this encounter: 4\' 11"  (1.499 m).   Weight as of this encounter: 59.4 kg.   DVT prophylaxis: Lovenox Code Status: Full code Family Communication: Care discussed with patient and daughter was at bedside.   Disposition Plan:  Status is: Inpatient  Dispo: The patient is from: Home              Anticipated d/c is to: To be determined              Patient currently is not medically stable to d/c.home tomorrow if sodium remain stable.  Difficult to place patient No        Consultants:   Cardiology   Procedures:   None  Antimicrobials:    Subjective: She is feeling well.  She had small BM this am.  She mention a history of night sweats.  Denies worsening back  pain.   Objective: Vitals:   02/14/21 1130 02/14/21 1200 02/14/21 1951 02/15/21 0402  BP: (!) 162/54 (!) 114/59 (!) 148/83 (!) 158/65  Pulse: 84 87 76 86  Resp: 17 16 17 16   Temp: 97.9 F (36.6 C)  97.9 F (36.6 C) 97.9 F (36.6 C)  TempSrc: Oral  Oral Oral  SpO2:  99% 99% 98%  Weight: 62.1 kg   59.4 kg  Height: 4\' 11"  (1.499 m)       Intake/Output Summary (Last 24 hours) at 02/15/2021 1242 Last data filed at 02/15/2021 1021 Gross per 24 hour  Intake 240 ml  Output 1 ml  Net 239 ml   Filed Weights   02/14/21 0503 02/14/21 1130 02/15/21 0402  Weight: 62.1 kg 62.1 kg 59.4 kg    Examination:  General exam: NAD Respiratory system: No respiratory distress, no accessory muscle use.  Central nervous system:Alert conversant.  Extremities: Plus 1 edema   Data Reviewed: I have personally reviewed following labs and imaging studies  CBC: Recent Labs  Lab 02/11/21 1321 02/12/21 0209 02/13/21 0700 02/15/21 0357  WBC 9.7 7.5 5.9 7.8  NEUTROABS 7.5  --   --   --   HGB 11.8* 10.5* 10.5* 10.7*  HCT 33.6* 30.1* 31.0* 30.9*  MCV 95.5 95.9 97.8 98.1  PLT 221 191 205 400   Basic Metabolic Panel: Recent Labs  Lab 02/11/21 1321 02/11/21 1447 02/11/21 1447 02/12/21 0209 02/12/21 0744 02/13/21 0700 02/14/21 0459 02/15/21 0357  NA  --  121*  --   --  126* 131* 136 133*  K  --  3.3*  --   --  3.6 3.5 3.9 4.6  CL  --  88*  --   --  99 103 108 106  CO2  --  19*  --   --  21* 19* 21* 19*  GLUCOSE  --  122*  --   --  101* 87 99 81  BUN  --  19  --   --  13 13 17 14   CREATININE  --  0.88   < > 0.94 0.86 0.72 0.74 0.69  CALCIUM  --  9.6  --   --  8.9 8.7* 9.3 8.8*  MG 1.9  --   --   --  1.7  --   --  1.9   < > = values in this interval not displayed.   GFR: Estimated Creatinine Clearance: 43.3 mL/min (by C-G formula based on SCr of 0.69 mg/dL). Liver Function Tests: Recent Labs  Lab 02/11/21 1447  AST 24  ALT 17  ALKPHOS 44  BILITOT 1.0  PROT 6.6  ALBUMIN 4.0    No results for input(s): LIPASE, AMYLASE in the last 168 hours. No results for input(s): AMMONIA in the last 168 hours. Coagulation Profile: No results for input(s): INR, PROTIME in the last 168 hours. Cardiac Enzymes: No results for input(s): CKTOTAL, CKMB, CKMBINDEX, TROPONINI in the last 168 hours. BNP (last 3 results) Recent Labs    01/21/21 1223  PROBNP 551   HbA1C: No results for input(s): HGBA1C in the last 72 hours. CBG: Recent Labs  Lab 02/14/21 1204 02/14/21 1722 02/14/21 2113 02/15/21  5809 02/15/21 1139  GLUCAP 129* 95 98 79 130*   Lipid Profile: No results for input(s): CHOL, HDL, LDLCALC, TRIG, CHOLHDL, LDLDIRECT in the last 72 hours. Thyroid Function Tests: No results for input(s): TSH, T4TOTAL, FREET4, T3FREE, THYROIDAB in the last 72 hours. Anemia Panel: Recent Labs    02/15/21 9833  [id number]   Sepsis Labs: No results for input(s): PROCALCITON, LATICACIDVEN in the last 168 hours.  Recent Results (from the past 240 hour(s))  SARS CORONAVIRUS 2 (TAT 6-24 HRS) Nasopharyngeal Nasopharyngeal Swab     Status: None   Collection Time: 02/11/21  1:27 PM   Specimen: Nasopharyngeal Swab  Result Value Ref Range Status   SARS Coronavirus 2 NEGATIVE NEGATIVE Final    Comment: (NOTE) SARS-CoV-2 target nucleic acids are NOT DETECTED.  The SARS-CoV-2 RNA is generally detectable in upper and lower respiratory specimens during the acute phase of infection. Negative results do not preclude SARS-CoV-2 infection, do not rule out co-infections with other pathogens, and should not be used as the sole basis for treatment or other patient management decisions. Negative results must be combined with clinical observations, patient history, and epidemiological information. The expected result is Negative.  Fact Sheet for Patients: SugarRoll.be  Fact Sheet for Healthcare Providers: https://www.woods-mathews.com/  This  test is not yet approved or cleared by the [place] FDA and  has been authorized for detection and/or diagnosis of SARS-CoV-2 by FDA under an Emergency Use Authorization (EUA). This EUA will remain  in effect (meaning this test can be used) for the duration of the COVID-19 declaration under Se ction 564(b)(1) of the Act, 21 U.S.C. section 360bbb-3(b)(1), unless the authorization is terminated or revoked sooner.  Performed at [facility], [place] [address], [place], [place] [address]          Radiology Studies: CT ABDOMEN PELVIS W CONTRAST  Result Date: 02/14/2021 CLINICAL DATA:  Diarrhea EXAM: CT ABDOMEN AND PELVIS WITH CONTRAST TECHNIQUE: Multidetector CT imaging of the abdomen and pelvis was performed using the standard protocol following bolus administration of intravenous contrast. CONTRAST:  151mL OMNIPAQUE IOHEXOL 300 MG/ML  SOLN COMPARISON:  12/13/2018 FINDINGS: Lower chest: No acute pleural or parenchymal lung disease. Hepatobiliary: No focal liver abnormality is seen. Status post cholecystectomy. No biliary dilatation. Pancreas: Unremarkable. No pancreatic ductal dilatation or surrounding inflammatory changes. Spleen: Normal in size without focal abnormality. Adrenals/Urinary Tract: There is mild bilateral renal cortical atrophy. Otherwise the kidneys enhance normally and symmetrically. No urinary tract calculi or obstructive uropathy. The bladder is decompressed, limiting its evaluation. The adrenals are grossly normal. Stomach/Bowel: No bowel obstruction or ileus. Normal appendix right lower quadrant. Diffuse diverticulosis of the sigmoid colon without diverticulitis. Vascular/Lymphatic: Aortic atherosclerosis. No enlarged abdominal or pelvic lymph nodes. Reproductive: Marked uterine atrophy.  No adnexal masses. Other: No free fluid or free gas.  No abdominal wall hernia. Musculoskeletal: Stable avascular necrosis of the bilateral femoral heads. There are no acute bony  abnormalities. Reconstructed images demonstrate no additional findings. IMPRESSION: 1. Sigmoid diverticulosis without diverticulitis. 2. No acute intra-abdominal or intrapelvic process. 3.  Aortic Atherosclerosis (ICD10-I70.0). Electronically Signed   By: Randa Ngo M.D.   On: 02/14/2021 19:59   VAS US CAROTID  Result Date: 02/14/2021 Carotid Arterial Duplex Study Indications:       Bilateral bruits and Syncope. Risk Factors:      Hypertension, Diabetes. Comparison Study:  No prior study Performing Technologist: Sharion Dove RVS  Examination Guidelines: A complete evaluation includes B-mode imaging, spectral  Doppler, color Doppler, and power Doppler as needed of all accessible portions of each vessel. Bilateral testing is considered an integral part of a complete examination. Limited examinations for reoccurring indications may be performed as noted.  Right Carotid Findings: +----------+--------+--------+--------+------------------+------------------+           PSV cm/sEDV cm/sStenosisPlaque DescriptionComments           +----------+--------+--------+--------+------------------+------------------+ CCA Prox  89      17                                intimal thickening +----------+--------+--------+--------+------------------+------------------+ CCA Distal48      9                                 intimal thickening +----------+--------+--------+--------+------------------+------------------+ ICA Prox  262     55      40-59%  calcific          Shadowing          +----------+--------+--------+--------+------------------+------------------+ ICA Mid   273     56                                                   +----------+--------+--------+--------+------------------+------------------+ ICA Distal121     22                                                   +----------+--------+--------+--------+------------------+------------------+ ECA       66      6                                                     +----------+--------+--------+--------+------------------+------------------+ +----------+--------+-------+--------+-------------------+           PSV cm/sEDV cmsDescribeArm Pressure (mmHG) +----------+--------+-------+--------+-------------------+ Subclavian190                                        +----------+--------+-------+--------+-------------------+ +---------+--------+---+--------+--+ VertebralPSV cm/s121EDV cm/s19 +---------+--------+---+--------+--+  Left Carotid Findings: +----------+--------+--------+--------+------------------+------------------+           PSV cm/sEDV cm/sStenosisPlaque DescriptionComments           +----------+--------+--------+--------+------------------+------------------+ CCA Prox  121     12                                intimal thickening +----------+--------+--------+--------+------------------+------------------+ CCA Distal79      14                                intimal thickening +----------+--------+--------+--------+------------------+------------------+ ICA Prox  132     29      1-39%   calcific                             +----------+--------+--------+--------+------------------+------------------+  ICA Mid   135     24                                                   +----------+--------+--------+--------+------------------+------------------+ ICA Distal138     26                                                   +----------+--------+--------+--------+------------------+------------------+ ECA       165     3                                                    +----------+--------+--------+--------+------------------+------------------+ +----------+--------+--------+--------+-------------------+           PSV cm/sEDV cm/sDescribeArm Pressure (mmHG) +----------+--------+--------+--------+-------------------+ [id number]                                          +----------+--------+--------+--------+-------------------+ +---------+--------+--+--------+-+---------------+ VertebralPSV cm/s40EDV cm/s7Bi- directional +---------+--------+--+--------+-+---------------+   Summary: Right Carotid: Velocities in the right ICA are consistent with a 40-59%                stenosis. Left Carotid: Velocities in the left ICA are consistent with a 1-39% stenosis. Vertebrals:  Right vertebral artery demonstrates antegrade flow. Left vertebral              artery demonstrates bidirectional flow. Subclavians: Normal flow hemodynamics were seen in bilateral subclavian              arteries. *See table(s) above for measurements and observations.     Preliminary    VAS [place] LOWER EXTREMITY VENOUS (DVT)  Result Date: 02/15/2021  Lower Venous DVT Study Indications: Swelling.  Risk Factors: None identified. Anticoagulation: Lovenox. Comparison Study: LLE reflux done 2/21 Neg DVT Pos reflux Performing Technologist: Vonzell Schlatter RVT  Examination Guidelines: A complete evaluation includes B-mode imaging, spectral Doppler, color Doppler, and power Doppler as needed of all accessible portions of each vessel. Bilateral testing is considered an integral part of a complete examination. Limited examinations for reoccurring indications may be performed as noted. The reflux portion of the exam is performed with the patient in reverse Trendelenburg.  +---------+---------------+---------+-----------+----------+--------------+ RIGHT    CompressibilityPhasicitySpontaneityPropertiesThrombus Aging +---------+---------------+---------+-----------+----------+--------------+ CFV      Full           Yes      Yes                                 +---------+---------------+---------+-----------+----------+--------------+ SFJ      Full                                                        +---------+---------------+---------+-----------+----------+--------------+ FV Prox  Full                                                         +---------+---------------+---------+-----------+----------+--------------+  FV Mid   Full                                                        +---------+---------------+---------+-----------+----------+--------------+ FV DistalFull                                                        +---------+---------------+---------+-----------+----------+--------------+ PFV      Full                                                        +---------+---------------+---------+-----------+----------+--------------+ POP      Full           Yes      Yes                                 +---------+---------------+---------+-----------+----------+--------------+ PTV      Full                                                        +---------+---------------+---------+-----------+----------+--------------+ PERO     Full                                                        +---------+---------------+---------+-----------+----------+--------------+   +---------+---------------+---------+-----------+----------+--------------+ LEFT     CompressibilityPhasicitySpontaneityPropertiesThrombus Aging +---------+---------------+---------+-----------+----------+--------------+ CFV      Full           Yes      Yes                                 +---------+---------------+---------+-----------+----------+--------------+ SFJ      Full                                                        +---------+---------------+---------+-----------+----------+--------------+ FV Prox  Full                                                        +---------+---------------+---------+-----------+----------+--------------+ FV Mid   Full                                                        +---------+---------------+---------+-----------+----------+--------------+  FV DistalFull                                                         +---------+---------------+---------+-----------+----------+--------------+ PFV      Full                                                        +---------+---------------+---------+-----------+----------+--------------+ POP      Full           Yes      Yes                                 +---------+---------------+---------+-----------+----------+--------------+ PTV      Full                                                        +---------+---------------+---------+-----------+----------+--------------+ PERO     Full                                                        +---------+---------------+---------+-----------+----------+--------------+   Summary: BILATERAL: - No evidence of deep vein thrombosis seen in the lower extremities, bilaterally. -No evidence of popliteal cyst, bilaterally. RIGHT: - No cystic structure found in the popliteal fossa.  LEFT: - No cystic structure found in the popliteal fossa.  *See table(s) above for measurements and observations.    Preliminary         Scheduled Meds: . cycloSPORINE  1 drop Both Eyes BID  . enoxaparin (LOVENOX) injection  40 mg Subcutaneous Q24H  . hydroxypropyl methylcellulose / hypromellose  1 drop Both Eyes QHS  . insulin aspart  0-15 Units Subcutaneous TID WC  . levothyroxine  25 mcg Oral Once per day on Sun Tue Thu Sat   And  . levothyroxine  100 mcg Oral Once per day on Mon Wed Fri  . psyllium  1 packet Oral Daily  . rosuvastatin  5 mg Oral Daily  . sucralfate  1 g Oral TID WC & HS  . vitamin B-12  1,000 mcg Oral Daily   Continuous Infusions:    LOS: 3 days    Time spent: 35 minutes    Tomer Chalmers A Rice Walsh, MD [facility]   If 7PM-7AM, please contact night-coverage [email or web address]  02/15/2021, 12:42 PM

## 2021-02-15 NOTE — Progress Notes (Signed)
Cardiology Progress Note  Patient ID: Shaquanna Lycan MRN: [id number] DOB: 04-Nov-1941 Date of Encounter: 02/15/2021  Primary Cardiologist: Werner Lean, MD  Subjective   Chief Complaint: None.  HPI: She reports she feels better.  Mild to moderate carotid artery disease that does not explain this.  Symptoms are improving with hydration.  ROS:  All other ROS reviewed and negative. Pertinent positives noted in the HPI.     Inpatient Medications  Scheduled Meds: . cycloSPORINE  1 drop Both Eyes BID  . enoxaparin (LOVENOX) injection  40 mg Subcutaneous Q24H  . hydroxypropyl methylcellulose / hypromellose  1 drop Both Eyes QHS  . insulin aspart  0-15 Units Subcutaneous TID WC  . levothyroxine  25 mcg Oral Once per day on Sun Tue Thu Sat   And  . levothyroxine  100 mcg Oral Once per day on Mon Wed Fri  . polyethylene glycol  17 g Oral Daily  . psyllium  1 packet Oral Daily  . rosuvastatin  5 mg Oral Daily  . sucralfate  1 g Oral TID WC & HS  . vitamin B-12  1,000 mcg Oral Daily   Continuous Infusions:  PRN Meds: acetaminophen, diclofenac Sodium, fluticasone, methocarbamol, nitroGLYCERIN, ondansetron   Vital Signs   Vitals:   02/14/21 1130 02/14/21 1200 02/14/21 1951 02/15/21 0402  BP: (!) 162/54 (!) 114/59 (!) 148/83 (!) 158/65  Pulse: 84 87 76 86  Resp: 17 16 17 16   Temp: 97.9 F (36.6 C)  97.9 F (36.6 C) 97.9 F (36.6 C)  TempSrc: Oral  Oral Oral  SpO2:  99% 99% 98%  Weight: 62.1 kg   59.4 kg  Height: 4\' 11"  (1.499 m)       Intake/Output Summary (Last 24 hours) at 02/15/2021 0802 Last data filed at 02/15/2021 0700 Gross per 24 hour  Intake 480 ml  Output 850 ml  Net -370 ml   Last 3 Weights 02/15/2021 02/14/2021 02/14/2021  Weight (lbs) 131 lb 136 lb 14.5 oz 136 lb 14.5 oz  Weight (kg) 59.421 kg 62.1 kg 62.1 kg      Telemetry  Overnight telemetry shows sinus bradycardia heart rate in the 50s, which I personally reviewed.   ECG  The most recent  ECG shows sinus rhythm heart rate 86, inferior infarct, which I personally reviewed.   Physical Exam   Vitals:   02/14/21 1130 02/14/21 1200 02/14/21 1951 02/15/21 0402  BP: (!) 162/54 (!) 114/59 (!) 148/83 (!) 158/65  Pulse: 84 87 76 86  Resp: 17 16 17 16   Temp: 97.9 F (36.6 C)  97.9 F (36.6 C) 97.9 F (36.6 C)  TempSrc: Oral  Oral Oral  SpO2:  99% 99% 98%  Weight: 62.1 kg   59.4 kg  Height: 4\' 11"  (1.499 m)        Intake/Output Summary (Last 24 hours) at 02/15/2021 0802 Last data filed at 02/15/2021 0700 Gross per 24 hour  Intake 480 ml  Output 850 ml  Net -370 ml    Last 3 Weights 02/15/2021 02/14/2021 02/14/2021  Weight (lbs) 131 lb 136 lb 14.5 oz 136 lb 14.5 oz  Weight (kg) 59.421 kg 62.1 kg 62.1 kg    Body mass index is 26.46 kg/m.   General: Well nourished, well developed, in no acute distress Head: Atraumatic, normal size  Eyes: PEERLA, EOMI  Neck: Supple, no JVD, carotid bruit noted Endocrine: No thryomegaly Cardiac: Normal S1, S2; RRR; 2 out of 6 systolic ejection murmur, faint Lungs: Clear  to auscultation bilaterally, no wheezing, rhonchi or rales  Abd: Soft, nontender, no hepatomegaly  Ext: No edema, pulses 2+ Musculoskeletal: No deformities, BUE and BLE strength normal and equal Skin: Warm and dry, no rashes   Neuro: Alert and oriented to person, place, time, and situation, CNII-XII grossly intact, no focal deficits  Psych: Normal mood and affect   Labs  High Sensitivity Troponin:   Recent Labs  Lab 02/01/21 2200 02/11/21 1321 02/12/21 0209  TROPONINIHS 10 9 10      Cardiac EnzymesNo results for input(s): TROPONINI in the last 168 hours. No results for input(s): TROPIPOC in the last 168 hours.  Chemistry Recent Labs  Lab 02/11/21 1447 02/12/21 0209 02/13/21 0700 02/14/21 0459 02/15/21 0357  NA 121*   < > 131* 136 133*  K 3.3*   < > 3.5 3.9 4.6  CL 88*   < > 103 108 106  CO2 19*   < > 19* 21* 19*  GLUCOSE 122*   < > 87 99 81  BUN 19   < >  13 17 14   CREATININE 0.88   < > 0.72 0.74 0.69  CALCIUM 9.6   < > 8.7* 9.3 8.8*  PROT 6.6  --   --   --   --   ALBUMIN 4.0  --   --   --   --   AST 24  --   --   --   --   ALT 17  --   --   --   --   ALKPHOS 44  --   --   --   --   BILITOT 1.0  --   --   --   --   GFRNONAA >60   < > >60 >60 >60  ANIONGAP 14   < > 9 7 8    < > = values in this interval not displayed.    Hematology Recent Labs  Lab 02/12/21 0209 02/13/21 0700 02/15/21 0357  WBC 7.5 5.9 7.8  RBC 3.14* 3.17* 3.15*  HGB 10.5* 10.5* 10.7*  HCT 30.1* 31.0* 30.9*  MCV 95.9 97.8 98.1  MCH 33.4 33.1 34.0  MCHC 34.9 33.9 34.6  RDW 12.1 12.6 13.3  PLT 191 205 197   BNPNo results for input(s): BNP, PROBNP in the last 168 hours.  DDimer  Recent Labs  Lab 02/11/21 1447  DDIMER 1.05*     Radiology  CT ABDOMEN PELVIS W CONTRAST  Result Date: 02/14/2021 CLINICAL DATA:  Diarrhea EXAM: CT ABDOMEN AND PELVIS WITH CONTRAST TECHNIQUE: Multidetector CT imaging of the abdomen and pelvis was performed using the standard protocol following bolus administration of intravenous contrast. CONTRAST:  11mL OMNIPAQUE IOHEXOL 300 MG/ML  SOLN COMPARISON:  12/13/2018 FINDINGS: Lower chest: No acute pleural or parenchymal lung disease. Hepatobiliary: No focal liver abnormality is seen. Status post cholecystectomy. No biliary dilatation. Pancreas: Unremarkable. No pancreatic ductal dilatation or surrounding inflammatory changes. Spleen: Normal in size without focal abnormality. Adrenals/Urinary Tract: There is mild bilateral renal cortical atrophy. Otherwise the kidneys enhance normally and symmetrically. No urinary tract calculi or obstructive uropathy. The bladder is decompressed, limiting its evaluation. The adrenals are grossly normal. Stomach/Bowel: No bowel obstruction or ileus. Normal appendix right lower quadrant. Diffuse diverticulosis of the sigmoid colon without diverticulitis. Vascular/Lymphatic: Aortic atherosclerosis. No enlarged  abdominal or pelvic lymph nodes. Reproductive: Marked uterine atrophy.  No adnexal masses. Other: No free fluid or free gas.  No abdominal wall hernia. Musculoskeletal: Stable avascular necrosis of  the bilateral femoral heads. There are no acute bony abnormalities. Reconstructed images demonstrate no additional findings. IMPRESSION: 1. Sigmoid diverticulosis without diverticulitis. 2. No acute intra-abdominal or intrapelvic process. 3.  Aortic Atherosclerosis (ICD10-I70.0). Electronically Signed   By: Randa Ngo M.D.   On: 02/14/2021 19:59   VAS US CAROTID  Result Date: 02/14/2021 Carotid Arterial Duplex Study Indications:       Bilateral bruits and Syncope. Risk Factors:      Hypertension, Diabetes. Comparison Study:  No prior study Performing Technologist: Sharion Dove RVS  Examination Guidelines: A complete evaluation includes B-mode imaging, spectral Doppler, color Doppler, and power Doppler as needed of all accessible portions of each vessel. Bilateral testing is considered an integral part of a complete examination. Limited examinations for reoccurring indications may be performed as noted.  Right Carotid Findings: +----------+--------+--------+--------+------------------+------------------+           PSV cm/sEDV cm/sStenosisPlaque DescriptionComments           +----------+--------+--------+--------+------------------+------------------+ CCA Prox  89      17                                intimal thickening +----------+--------+--------+--------+------------------+------------------+ CCA Distal48      9                                 intimal thickening +----------+--------+--------+--------+------------------+------------------+ ICA Prox  262     55      40-59%  calcific          Shadowing          +----------+--------+--------+--------+------------------+------------------+ ICA Mid   273     56                                                    +----------+--------+--------+--------+------------------+------------------+ ICA Distal121     22                                                   +----------+--------+--------+--------+------------------+------------------+ ECA       66      6                                                    +----------+--------+--------+--------+------------------+------------------+ +----------+--------+-------+--------+-------------------+           PSV cm/sEDV cmsDescribeArm Pressure (mmHG) +----------+--------+-------+--------+-------------------+ Subclavian190                                        +----------+--------+-------+--------+-------------------+ +---------+--------+---+--------+--+ VertebralPSV cm/s121EDV cm/s19 +---------+--------+---+--------+--+  Left Carotid Findings: +----------+--------+--------+--------+------------------+------------------+           PSV cm/sEDV cm/sStenosisPlaque DescriptionComments           +----------+--------+--------+--------+------------------+------------------+ CCA Prox  121     12  intimal thickening +----------+--------+--------+--------+------------------+------------------+ CCA Distal79      14                                intimal thickening +----------+--------+--------+--------+------------------+------------------+ ICA Prox  132     29      1-39%   calcific                             +----------+--------+--------+--------+------------------+------------------+ ICA Mid   135     24                                                   +----------+--------+--------+--------+------------------+------------------+ ICA Distal138     26                                                   +----------+--------+--------+--------+------------------+------------------+ ECA       165     3                                                     +----------+--------+--------+--------+------------------+------------------+ +----------+--------+--------+--------+-------------------+           PSV cm/sEDV cm/sDescribeArm Pressure (mmHG) +----------+--------+--------+--------+-------------------+ [id number]                                         +----------+--------+--------+--------+-------------------+ +---------+--------+--+--------+-+---------------+ VertebralPSV cm/s40EDV cm/s7Bi- directional +---------+--------+--+--------+-+---------------+   Summary: Right Carotid: Velocities in the right ICA are consistent with a 40-59%                stenosis. Left Carotid: Velocities in the left ICA are consistent with a 1-39% stenosis. Vertebrals:  Right vertebral artery demonstrates antegrade flow. Left vertebral              artery demonstrates bidirectional flow. Subclavians: Normal flow hemodynamics were seen in bilateral subclavian              arteries. *See table(s) above for measurements and observations.     Preliminary     Cardiac Studies  TTE 02/02/2021 1. Left ventricular ejection fraction, by estimation, is 65 to 70%. The  left ventricle has normal function. The left ventricle has no regional  wall motion abnormalities. Left ventricular diastolic parameters are  consistent with Grade I diastolic  dysfunction (impaired relaxation).  2. Right ventricular systolic function is normal. The right ventricular  size is mildly enlarged. There is normal pulmonary artery systolic  pressure.  3. Left atrial size was mild to moderately dilated.  4. The mitral valve is normal in structure. Mild mitral valve  regurgitation. No evidence of mitral stenosis. Moderate mitral annular  calcification.  5. Tricuspid valve regurgitation is mild to moderate.  6. The aortic valve is normal in structure. Aortic valve regurgitation is  not visualized. No aortic stenosis is present.  7. The inferior vena cava is normal in size with  greater than 50%  respiratory variability, suggesting right atrial pressure of 3 mmHg.   Vasc US 02/14/2021 Summary:  Right Carotid: Velocities in the right ICA are consistent with a 40-59%         stenosis.   Left Carotid: Velocities in the left ICA are consistent with a 1-39%  stenosis.   Vertebrals: Right vertebral artery demonstrates antegrade flow. Left  vertebral        artery demonstrates bidirectional flow.  Subclavians: Normal flow hemodynamics were seen in bilateral subclavian        arteries.   Patient Profile  Tahiri Shareef is a 80 y.o. female with hypertension, diabetes, mention of carotid artery disease (no report), irritable bowel syndrome, hypothyroidism who was admitted on 02/11/2021 with what appears to be recurrent vasovagal syncope.  Assessment & Plan   1.  Recurrent vasovagal syncope -3 episodes with classic prodrome of blurry vision and dizziness while standing.  She has been able to circumvent these episodes while sitting down.  She has had 2 episodes where she is passed out.  She did pass out in the cardiology office and underwent brief CPR.  I suspect this was just a profound vasovagal event. -No arrhythmias.  EKG shows sinus rhythm with inferior infarct.  Echocardiogram shows normal LV function.  Troponins negative.  This is not acute coronary syndrome. -She has a faint murmur.  Consistent with aortic valve sclerosis.  No evidence of stenosis. -She comes back quickly with the event.  No seizure-like activity. -Admitted with profound hyponatremia and hypokalemia.  QTC was prolonged in setting of this but has normalized. -Overall I suspect her symptoms are related to hypovolemia.  She has been told she was prediabetic and was watching what she was eating.  She also was avoiding all salt intake.  She also was on a thiazide diuretic and clearly was volume down.  Her electrolytes have improved with hydration. -Work-up for adrenal insufficiency  is negative. -We did repeat carotid Dopplers.  She has 40 to 59% right ICA stenosis.  This is not the cause of her symptoms.  She has had no strokelike symptoms. -Would recommend a modified salt diet moving forward.  Clearly she should avoid thiazide diuretics.  Would recommend good oral intake.  She also needs to make sure she is not dehydrated.  Would allow for lenient blood pressure for now.  She can have follow-up with her primary care physician and cardiology discharge. -Overall, her symptoms are still consistent with recurrent vasovagal syncope.  I suspect this is been driven by dehydration.  For questions or updates, please contact [place] Please consult [email or web address] for contact info under   Time Spent with Patient: I have spent a total of 25 minutes with patient reviewing hospital notes, telemetry, EKGs, labs and examining the patient as well as establishing an assessment and plan that was discussed with the patient.  > 50% of time was spent in direct patient care.    Signed, Addison Naegeli. Audie Box, MD, [place]  02/15/2021 8:02 AM

## 2021-02-15 NOTE — Consult Note (Signed)
Reason for Consult: History of IBS Referring Physician: Hospital team  Christina Sellers is an 80 y.o. female.  HPI: Patient seen and examined and case discussed with the hospital team in her hospital computer chart in our office computer chart reviewed and her case discussed with her daughter as well and she says about 2 times a month she will have bouts with diarrhea sometimes is related to something she eats and if it goes on more than a few times she will take half an Imodium which will help and if she takes a full Imodium she will get constipated and this time she had a little worse abdominal pain before her bout of diarrhea but she does not think it was related to passing out and her pain was better after moving her bowels and her previous work-up was reviewed and reassurance was given about her CT and we answered all of the patient and her daughter's questions  Past Medical History:  Diagnosis Date  . Abnormal Pap smear of vagina   . Allergic sinusitis   . Anxiety   . Atherosclerotic peripheral vascular disease ([place])   . Carotid artery stenosis   . Cellulitis   . COPD with asthma ([place])    not an issue now  . Depression   . Diabetes mellitus type 2, uncomplicated ([place])   . DJD (degenerative joint disease)   . Elevated cholesterol   . Elevated MCV    secondary to alchol  . GERD (gastroesophageal reflux disease)    not an issue now.  . Gout   . Hearing difficulty    bilateral hearing aids  . History of recurrent UTIs   . Hypertension    loss weight'off meds now"  . Hypothyroidism   . IBS (irritable bowel syndrome)   . Insomnia   . Labial fusion   . Osteoarthritis of left knee   . Osteopenia   . Recurrent vaginitis   . Sjogren's syndrome ([place])   . Syncope 11.7.14   secondary to dehydration and possible hypoglycemia  . Tendonitis   . Thyroid disease   . Transfusion history    age 77 "anemia"    Past Surgical History:  Procedure Laterality Date  . CATARACT EXTRACTION,  BILATERAL Bilateral   . CHOLECYSTECTOMY     open  . COLONOSCOPY WITH PROPOFOL N/A 03/23/2016   Procedure: COLONOSCOPY WITH PROPOFOL;  Surgeon: Garlan Fair, MD;  Location: [facility];  Service: Endoscopy;  Laterality: N/A;  . EYE SURGERY    . FOOT SURGERY Left    retained hardware  . KNEE SURGERY Left    scope   . TONSILLECTOMY      Family History  Problem Relation Age of Onset  . Diabetes Maternal Aunt   . Cancer Father        Oral cancer  . Breast cancer Sister        Age 64's  . Cancer Brother        Lung cancer    Social History:  reports that she quit smoking about 39 years ago. She has never used smokeless tobacco. She reports that she does not drink alcohol and does not use drugs.  Allergies:  Allergies  Allergen Reactions  . Tape Other (See Comments)    SKIN IS VERY THIN- TAPE TEARS VERY EASILY!!  . Celexa [Citalopram Hydrobromide] Other (See Comments)    Nightmares   . Fluconazole Other (See Comments)    Unknown reaction  . Green Tea Leaf Ext Other (  See Comments)    Mouth ulcers  . Kenalog [Triamcinolone] Other (See Comments)    Spinal injection caused sweats  . Levaquin [Levofloxacin] Hives  . Losartan Potassium-Hctz Other (See Comments)    Night sweats/ fatigue  . Methocarbamol Other (See Comments)    500 mg is too much- can tolerate a lesser dose  . Nitrofurantoin Macrocrystal Other (See Comments)    Possible vertigo  . Scopolamine Other (See Comments)    Memory change  . Sulfa Antibiotics Other (See Comments)    Reaction not recalled by the patient  . Trimethoprim Hives    Medications: I have reviewed the patient's current medications.  Results for orders placed or performed during the hospital encounter of 02/11/21 (from the past 48 hour(s))  Glucose, capillary     Status: Abnormal   Collection Time: 02/13/21 12:23 PM  Result Value Ref Range   Glucose-Capillary 115 (H) 70 - 99 mg/dL    Comment: Glucose reference range applies only to  samples taken after fasting for at least 8 hours.  Glucose, capillary     Status: Abnormal   Collection Time: 02/13/21  4:30 PM  Result Value Ref Range   Glucose-Capillary 105 (H) 70 - 99 mg/dL    Comment: Glucose reference range applies only to samples taken after fasting for at least 8 hours.  Glucose, capillary     Status: Abnormal   Collection Time: 02/13/21  9:16 PM  Result Value Ref Range   Glucose-Capillary 132 (H) 70 - 99 mg/dL    Comment: Glucose reference range applies only to samples taken after fasting for at least 8 hours.  ACTH stimulation, 3 time points     Status: None   Collection Time: 02/14/21  4:55 AM  Result Value Ref Range   Cortisol, Base 9.6 ug/dL    Comment: NO NORMAL RANGE ESTABLISHED FOR THIS TEST   Cortisol, 30 Min 22.5 ug/dL   Cortisol, 60 Min 26.8 ug/dL    Comment: Performed at [facility] [address], [place], [place] [address]  Basic metabolic panel     Status: Abnormal   Collection Time: 02/14/21  4:59 AM  Result Value Ref Range   Sodium 136 135 - 145 mmol/L   Potassium 3.9 3.5 - 5.1 mmol/L   Chloride 108 98 - 111 mmol/L   CO2 21 (L) 22 - 32 mmol/L   Glucose, Bld 99 70 - 99 mg/dL    Comment: Glucose reference range applies only to samples taken after fasting for at least 8 hours.   BUN 17 8 - 23 mg/dL   Creatinine, Ser 0.74 0.44 - 1.00 mg/dL   Calcium 9.3 8.9 - 10.3 mg/dL   GFR, Estimated >60 >60 mL/min    Comment: (NOTE) Calculated using the CKD-EPI Creatinine Equation (2021)    Anion gap 7 5 - 15    Comment: Performed at [facility] [address], [place], [place] [address]  Glucose, capillary     Status: Abnormal   Collection Time: 02/14/21  8:23 AM  Result Value Ref Range   Glucose-Capillary 122 (H) 70 - 99 mg/dL    Comment: Glucose reference range applies only to samples taken after fasting for at least 8 hours.  Glucose, capillary     Status: Abnormal   Collection Time: 02/14/21 12:04 PM  Result Value Ref Range    Glucose-Capillary 129 (H) 70 - 99 mg/dL    Comment: Glucose reference range applies only to samples taken  after fasting for at least 8 hours.  Glucose, capillary     Status: None   Collection Time: 02/14/21  5:22 PM  Result Value Ref Range   Glucose-Capillary 95 70 - 99 mg/dL    Comment: Glucose reference range applies only to samples taken after fasting for at least 8 hours.  Glucose, capillary     Status: None   Collection Time: 02/14/21  9:13 PM  Result Value Ref Range   Glucose-Capillary 98 70 - 99 mg/dL    Comment: Glucose reference range applies only to samples taken after fasting for at least 8 hours.  Vitamin B12     Status: None   Collection Time: 02/15/21  3:57 AM  Result Value Ref Range   Vitamin B-12 749 180 - 914 pg/mL    Comment: (NOTE) This assay is not validated for testing neonatal or myeloproliferative syndrome specimens for Vitamin B12 levels. Performed at [facility], [place] [address] [place], [place] [address]   CBC     Status: Abnormal   Collection Time: 02/15/21  3:57 AM  Result Value Ref Range   WBC 7.8 4.0 - 10.5 K/uL   RBC 3.15 (L) 3.87 - 5.11 MIL/uL   Hemoglobin 10.7 (L) 12.0 - 15.0 g/dL   HCT 30.9 (L) 36.0 - 46.0 %   MCV 98.1 80.0 - 100.0 fL   MCH 34.0 26.0 - 34.0 pg   MCHC 34.6 30.0 - 36.0 g/dL   RDW 13.3 11.5 - 15.5 %   Platelets 197 150 - 400 K/uL   nRBC 0.0 0.0 - 0.2 %    Comment: Performed at [facility], [place] [address] [place], [place] [address]  Basic metabolic panel     Status: Abnormal   Collection Time: 02/15/21  3:57 AM  Result Value Ref Range   Sodium 133 (L) 135 - 145 mmol/L   Potassium 4.6 3.5 - 5.1 mmol/L    Comment: SPECIMEN HEMOLYZED. HEMOLYSIS MAY AFFECT INTEGRITY OF RESULTS.   Chloride 106 98 - 111 mmol/L   CO2 19 (L) 22 - 32 mmol/L   Glucose, Bld 81 70 - 99 mg/dL    Comment: Glucose reference range applies only to samples taken after fasting for at least 8 hours.   BUN 14 8 - 23 mg/dL   Creatinine, Ser 0.69  0.44 - 1.00 mg/dL   Calcium 8.8 (L) 8.9 - 10.3 mg/dL   GFR, Estimated >60 >60 mL/min    Comment: (NOTE) Calculated using the CKD-EPI Creatinine Equation (2021)    Anion gap 8 5 - 15    Comment: Performed at [facility] [address], [place], [place] [address]  Magnesium     Status: None   Collection Time: 02/15/21  3:57 AM  Result Value Ref Range   Magnesium 1.9 1.7 - 2.4 mg/dL    Comment: Performed at [facility] [address], [place], [place] [address]  Glucose, capillary     Status: None   Collection Time: 02/15/21  7:33 AM  Result Value Ref Range   Glucose-Capillary 79 70 - 99 mg/dL    Comment: Glucose reference range applies only to samples taken after fasting for at least 8 hours.    CT ABDOMEN PELVIS W CONTRAST  Result Date: 02/14/2021 CLINICAL DATA:  Diarrhea EXAM: CT ABDOMEN AND PELVIS WITH CONTRAST TECHNIQUE: Multidetector CT imaging of the abdomen and pelvis was performed using the standard protocol following bolus administration of intravenous contrast.  CONTRAST:  156mL OMNIPAQUE IOHEXOL 300 MG/ML  SOLN COMPARISON:  12/13/2018 FINDINGS: Lower chest: No acute pleural or parenchymal lung disease. Hepatobiliary: No focal liver abnormality is seen. Status post cholecystectomy. No biliary dilatation. Pancreas: Unremarkable. No pancreatic ductal dilatation or surrounding inflammatory changes. Spleen: Normal in size without focal abnormality. Adrenals/Urinary Tract: There is mild bilateral renal cortical atrophy. Otherwise the kidneys enhance normally and symmetrically. No urinary tract calculi or obstructive uropathy. The bladder is decompressed, limiting its evaluation. The adrenals are grossly normal. Stomach/Bowel: No bowel obstruction or ileus. Normal appendix right lower quadrant. Diffuse diverticulosis of the sigmoid colon without diverticulitis. Vascular/Lymphatic: Aortic atherosclerosis. No enlarged abdominal or pelvic lymph nodes. Reproductive: Marked uterine  atrophy.  No adnexal masses. Other: No free fluid or free gas.  No abdominal wall hernia. Musculoskeletal: Stable avascular necrosis of the bilateral femoral heads. There are no acute bony abnormalities. Reconstructed images demonstrate no additional findings. IMPRESSION: 1. Sigmoid diverticulosis without diverticulitis. 2. No acute intra-abdominal or intrapelvic process. 3.  Aortic Atherosclerosis (ICD10-I70.0). Electronically Signed   By: Randa Ngo M.D.   On: 02/14/2021 19:59   VAS US CAROTID  Result Date: 02/14/2021 Carotid Arterial Duplex Study Indications:       Bilateral bruits and Syncope. Risk Factors:      Hypertension, Diabetes. Comparison Study:  No prior study Performing Technologist: Sharion Dove RVS  Examination Guidelines: A complete evaluation includes B-mode imaging, spectral Doppler, color Doppler, and power Doppler as needed of all accessible portions of each vessel. Bilateral testing is considered an integral part of a complete examination. Limited examinations for reoccurring indications may be performed as noted.  Right Carotid Findings: +----------+--------+--------+--------+------------------+------------------+           PSV cm/sEDV cm/sStenosisPlaque DescriptionComments           +----------+--------+--------+--------+------------------+------------------+ CCA Prox  89      17                                intimal thickening +----------+--------+--------+--------+------------------+------------------+ CCA Distal48      9                                 intimal thickening +----------+--------+--------+--------+------------------+------------------+ ICA Prox  262     55      40-59%  calcific          Shadowing          +----------+--------+--------+--------+------------------+------------------+ ICA Mid   273     56                                                   +----------+--------+--------+--------+------------------+------------------+ ICA  Distal121     22                                                   +----------+--------+--------+--------+------------------+------------------+ ECA       66      6                                                    +----------+--------+--------+--------+------------------+------------------+ +----------+--------+-------+--------+-------------------+  PSV cm/sEDV cmsDescribeArm Pressure (mmHG) +----------+--------+-------+--------+-------------------+ Subclavian190                                        +----------+--------+-------+--------+-------------------+ +---------+--------+---+--------+--+ VertebralPSV cm/s121EDV cm/s19 +---------+--------+---+--------+--+  Left Carotid Findings: +----------+--------+--------+--------+------------------+------------------+           PSV cm/sEDV cm/sStenosisPlaque DescriptionComments           +----------+--------+--------+--------+------------------+------------------+ CCA Prox  121     12                                intimal thickening +----------+--------+--------+--------+------------------+------------------+ CCA Distal79      14                                intimal thickening +----------+--------+--------+--------+------------------+------------------+ ICA Prox  132     29      1-39%   calcific                             +----------+--------+--------+--------+------------------+------------------+ ICA Mid   135     24                                                   +----------+--------+--------+--------+------------------+------------------+ ICA Distal138     26                                                   +----------+--------+--------+--------+------------------+------------------+ ECA       165     3                                                    +----------+--------+--------+--------+------------------+------------------+  +----------+--------+--------+--------+-------------------+           PSV cm/sEDV cm/sDescribeArm Pressure (mmHG) +----------+--------+--------+--------+-------------------+ [id number]                                         +----------+--------+--------+--------+-------------------+ +---------+--------+--+--------+-+---------------+ VertebralPSV cm/s40EDV cm/s7Bi- directional +---------+--------+--+--------+-+---------------+   Summary: Right Carotid: Velocities in the right ICA are consistent with a 40-59%                stenosis. Left Carotid: Velocities in the left ICA are consistent with a 1-39% stenosis. Vertebrals:  Right vertebral artery demonstrates antegrade flow. Left vertebral              artery demonstrates bidirectional flow. Subclavians: Normal flow hemodynamics were seen in bilateral subclavian              arteries. *See table(s) above for measurements and observations.     Preliminary     Review of Systems negative except above she is eating fine today without any GI symptoms Blood pressure (!) 158/65, pulse 86, temperature 97.9 F (36.6 C),  temperature source Oral, resp. rate 16, height 4\' 11"  (1.499 m), weight 59.4 kg, SpO2 98 %. Physical Exam vital signs stable afebrile no acute distress exam pertinent for abdomen being soft nontender labs and CT reviewed as well as previous work-up  Assessment/Plan: IBS Plan: Please call us back if we could be of any further assistance with this hospital stay otherwise I have given him my card and happy to see back as an outpatient if questions or problems regarding her GI tract and happy to discuss other medicines options with her as an outpatient to include possibly trying an antispasmodic or Pepto-Bismol or Kaopectate as well as needed  MAGOD,MARC E 02/15/2021, 10:38 AM

## 2021-02-16 DIAGNOSIS — I951 Orthostatic hypotension: Secondary | ICD-10-CM | POA: Diagnosis not present

## 2021-02-16 DIAGNOSIS — R55 Syncope and collapse: Secondary | ICD-10-CM | POA: Diagnosis not present

## 2021-02-16 LAB — BASIC METABOLIC PANEL
Anion gap: 6 (ref 5–15)
BUN: 13 mg/dL (ref 8–23)
CO2: 23 mmol/L (ref 22–32)
Calcium: 8.6 mg/dL — ABNORMAL LOW (ref 8.9–10.3)
Chloride: 107 mmol/L (ref 98–111)
Creatinine, Ser: 0.74 mg/dL (ref 0.44–1.00)
GFR, Estimated: >60 mL/min (ref >60)
Glucose, Bld: 90 mg/dL (ref 70–99)
Potassium: 3.5 mmol/L (ref 3.5–5.1)
Sodium: 136 mmol/L (ref 135–145)

## 2021-02-16 LAB — GLUCOSE, CAPILLARY
Glucose-Capillary: 93 mg/dL (ref 70–99)
Glucose-Capillary: 117 mg/dL — ABNORMAL HIGH (ref 70–99)

## 2021-02-16 MED ORDER — LEVOTHYROXINE SODIUM 25 MCG PO TABS: 25.0000 ug | ORAL_TABLET | ORAL | 0 refills | Status: DC

## 2021-02-16 MED ORDER — ENSURE ENLIVE PO LIQD: 237.0000 mL | Freq: Two times a day (BID) | ORAL | 12 refills | Status: DC

## 2021-02-16 MED ORDER — BLOOD GLUCOSE MONITOR KIT: PACK | 0 refills | Status: DC

## 2021-02-16 MED ORDER — SUCRALFATE 1 G PO TABS: 1.0000 g | ORAL_TABLET | Freq: Three times a day (TID) | ORAL | 0 refills | Status: DC

## 2021-02-16 MED ORDER — POTASSIUM CHLORIDE CRYS ER 20 MEQ PO TBCR
40.0000 meq | EXTENDED_RELEASE_TABLET | Freq: Once | ORAL | Status: AC
  Administered 2021-02-16: 40 meq via ORAL
  Filled 2021-02-16: qty 2

## 2021-02-16 MED ORDER — POTASSIUM CHLORIDE CRYS ER 20 MEQ PO TBCR: 20.0000 meq | EXTENDED_RELEASE_TABLET | Freq: Every day | ORAL | Status: DC

## 2021-02-16 MED ORDER — POTASSIUM CHLORIDE CRYS ER 20 MEQ PO TBCR: 20.0000 meq | EXTENDED_RELEASE_TABLET | Freq: Every day | ORAL | 0 refills | Status: DC

## 2021-02-16 MED ORDER — ACETAMINOPHEN 325 MG PO TABS: 650.0000 mg | ORAL_TABLET | ORAL | 0 refills | Status: DC | PRN

## 2021-02-16 NOTE — Progress Notes (Addendum)
Progress Note  Patient Name: Christina Sellers Date of Encounter: 02/16/2021  Primary Cardiologist: Dr. Rubye Beach, MD   Subjective   Doing well this AM, no specific complaints.   Inpatient Medications    Scheduled Meds:  cycloSPORINE  1 drop Both Eyes BID   enoxaparin (LOVENOX) injection  40 mg Subcutaneous Q24H   feeding supplement  237 mL Oral BID BM   hydroxypropyl methylcellulose / hypromellose  1 drop Both Eyes QHS   insulin aspart  0-15 Units Subcutaneous TID WC   levothyroxine  25 mcg Oral Once per day on Sun Tue Thu Sat   And   levothyroxine  100 mcg Oral Once per day on Mon Wed Fri   psyllium  1 packet Oral Daily   rosuvastatin  5 mg Oral Daily   sucralfate  1 g Oral TID WC & HS   vitamin B-12  1,000 mcg Oral Daily   Continuous Infusions:  sodium chloride 75 mL/hr at 02/16/21 0158   PRN Meds: acetaminophen, diclofenac Sodium, fluticasone, methocarbamol, nitroGLYCERIN, ondansetron, polyethylene glycol   Vital Signs    Vitals:   02/14/21 1951 02/15/21 0402 02/15/21 2012 02/16/21 0429  BP: (!) 148/83 (!) 158/65 (!) 156/68 (!) 143/62  Pulse: 76 86 73 80  Resp: 17 16 19 14   Temp: 97.9 F (36.6 C) 97.9 F (36.6 C) 97.9 F (36.6 C) 97.7 F (36.5 C)  TempSrc: Oral Oral Oral Oral  SpO2: 99% 98% 98% 99%  Weight:  59.4 kg  59.6 kg  Height:        Intake/Output Summary (Last 24 hours) at 02/16/2021 0643 Last data filed at 02/16/2021 0300 Gross per 24 hour  Intake 240 ml  Output 401 ml  Net -161 ml   Filed Weights   02/14/21 1130 02/15/21 0402 02/16/21 0429  Weight: 62.1 kg 59.4 kg 59.6 kg    Physical Exam   General: Well developed, well nourished, NAD Lungs:Clear to ausculation bilaterally. No wheezes, rales, or rhonchi. Breathing is unlabored. Cardiovascular: RRR with S1 S2. No murmurs Abdomen: Soft, non-tender, non-distended. No obvious abdominal masses. Extremities: No edema. Radial pulses 2+ bilaterally Neuro: Alert and  oriented. No focal deficits. No facial asymmetry. MAE spontaneously. Psych: Responds to questions appropriately with normal affect.    Labs    Chemistry Recent Labs  Lab 02/11/21 1447 02/12/21 0209 02/14/21 0459 02/15/21 0357 02/16/21 0237  NA 121*   < > 136 133* 136  K 3.3*   < > 3.9 4.6 3.5  CL 88*   < > 108 106 107  CO2 19*   < > 21* 19* 23  GLUCOSE 122*   < > 99 81 90  BUN 19   < > 17 14 13   CREATININE 0.88   < > 0.74 0.69 0.74  CALCIUM 9.6   < > 9.3 8.8* 8.6*  PROT 6.6  --   --   --   --   ALBUMIN 4.0  --   --   --   --   AST 24  --   --   --   --   ALT 17  --   --   --   --   ALKPHOS 44  --   --   --   --   BILITOT 1.0  --   --   --   --   GFRNONAA >60   < > >60 >60 >60  ANIONGAP 14   < > 7 8  6   < > = values in this interval not displayed.     Hematology Recent Labs  Lab 02/12/21 0209 02/13/21 0700 02/15/21 0357  WBC 7.5 5.9 7.8  RBC 3.14* 3.17* 3.15*  HGB 10.5* 10.5* 10.7*  HCT 30.1* 31.0* 30.9*  MCV 95.9 97.8 98.1  MCH 33.4 33.1 34.0  MCHC 34.9 33.9 34.6  RDW 12.1 12.6 13.3  PLT 191 205 197    Cardiac EnzymesNo results for input(s): TROPONINI in the last 168 hours. No results for input(s): TROPIPOC in the last 168 hours.   BNPNo results for input(s): BNP, PROBNP in the last 168 hours.   DDimer  Recent Labs  Lab 02/11/21 1447  DDIMER 1.05*     Radiology    CT ABDOMEN PELVIS W CONTRAST  Result Date: 02/14/2021 CLINICAL DATA:  Diarrhea EXAM: CT ABDOMEN AND PELVIS WITH CONTRAST TECHNIQUE: Multidetector CT imaging of the abdomen and pelvis was performed using the standard protocol following bolus administration of intravenous contrast. CONTRAST:  126mL OMNIPAQUE IOHEXOL 300 MG/ML  SOLN COMPARISON:  12/13/2018 FINDINGS: Lower chest: No acute pleural or parenchymal lung disease. Hepatobiliary: No focal liver abnormality is seen. Status post cholecystectomy. No biliary dilatation. Pancreas: Unremarkable. No pancreatic ductal dilatation or surrounding  inflammatory changes. Spleen: Normal in size without focal abnormality. Adrenals/Urinary Tract: There is mild bilateral renal cortical atrophy. Otherwise the kidneys enhance normally and symmetrically. No urinary tract calculi or obstructive uropathy. The bladder is decompressed, limiting its evaluation. The adrenals are grossly normal. Stomach/Bowel: No bowel obstruction or ileus. Normal appendix right lower quadrant. Diffuse diverticulosis of the sigmoid colon without diverticulitis. Vascular/Lymphatic: Aortic atherosclerosis. No enlarged abdominal or pelvic lymph nodes. Reproductive: Marked uterine atrophy.  No adnexal masses. Other: No free fluid or free gas.  No abdominal wall hernia. Musculoskeletal: Stable avascular necrosis of the bilateral femoral heads. There are no acute bony abnormalities. Reconstructed images demonstrate no additional findings. IMPRESSION: 1. Sigmoid diverticulosis without diverticulitis. 2. No acute intra-abdominal or intrapelvic process. 3.  Aortic Atherosclerosis (ICD10-I70.0). Electronically Signed   By: Randa Ngo M.D.   On: 02/14/2021 19:59   VAS US CAROTID  Result Date: 02/15/2021 Carotid Arterial Duplex Study Indications:       Bilateral bruits and Syncope. Risk Factors:      Hypertension, Diabetes. Comparison Study:  No prior study Performing Technologist: Sharion Dove RVS  Examination Guidelines: A complete evaluation includes B-mode imaging, spectral Doppler, color Doppler, and power Doppler as needed of all accessible portions of each vessel. Bilateral testing is considered an integral part of a complete examination. Limited examinations for reoccurring indications may be performed as noted.  Right Carotid Findings: +----------+--------+--------+--------+------------------+------------------+             PSV cm/s EDV cm/s Stenosis Plaque Description Comments            +----------+--------+--------+--------+------------------+------------------+  CCA Prox   89        17                                   intimal thickening  +----------+--------+--------+--------+------------------+------------------+  CCA Distal 48       9                                    intimal thickening  +----------+--------+--------+--------+------------------+------------------+  ICA Prox   262  55       40-59%   calcific           Shadowing           +----------+--------+--------+--------+------------------+------------------+  ICA Mid    273      56                                                       +----------+--------+--------+--------+------------------+------------------+  ICA Distal 121      22                                                       +----------+--------+--------+--------+------------------+------------------+  ECA        66       6                                                        +----------+--------+--------+--------+------------------+------------------+ +----------+--------+-------+--------+-------------------+             PSV cm/s EDV cms Describe Arm Pressure (mmHG)  +----------+--------+-------+--------+-------------------+  Subclavian 190                                            +----------+--------+-------+--------+-------------------+ +---------+--------+---+--------+--+  Vertebral PSV cm/s 121 EDV cm/s 19  +---------+--------+---+--------+--+  Left Carotid Findings: +----------+--------+--------+--------+------------------+------------------+             PSV cm/s EDV cm/s Stenosis Plaque Description Comments            +----------+--------+--------+--------+------------------+------------------+  CCA Prox   121      12                                   intimal thickening  +----------+--------+--------+--------+------------------+------------------+  CCA Distal 79       14                                   intimal thickening  +----------+--------+--------+--------+------------------+------------------+  ICA Prox   132      29       1-39%    calcific                                +----------+--------+--------+--------+------------------+------------------+  ICA Mid    135      24                                                       +----------+--------+--------+--------+------------------+------------------+  ICA Distal 138      26                                                       +----------+--------+--------+--------+------------------+------------------+  ECA        165      3                                                        +----------+--------+--------+--------+------------------+------------------+ +----------+--------+--------+--------+-------------------+             PSV cm/s EDV cm/s Describe Arm Pressure (mmHG)  +----------+--------+--------+--------+-------------------+  Subclavian 146                                             +----------+--------+--------+--------+-------------------+ +---------+--------+--+--------+-+---------------+  Vertebral PSV cm/s 40 EDV cm/s 7 Bi- directional  +---------+--------+--+--------+-+---------------+   Summary: Right Carotid: Velocities in the right ICA are consistent with a 40-59%                stenosis. Left Carotid: Velocities in the left ICA are consistent with a 1-39% stenosis. Vertebrals:  Right vertebral artery demonstrates antegrade flow. Left vertebral              artery demonstrates bidirectional flow. Subclavians: Normal flow hemodynamics were seen in bilateral subclavian              arteries. *See table(s) above for measurements and observations.  Electronically signed by Harold Barban MD on 02/15/2021 at 9:10:24 PM.    Final    VAS [place] LOWER EXTREMITY VENOUS (DVT)  Result Date: 02/15/2021  Lower Venous DVT Study Indications: Swelling.  Risk Factors: None identified. Anticoagulation: Lovenox. Comparison Study: LLE reflux done 2/21 Neg DVT Pos reflux Performing Technologist: Vonzell Schlatter RVT  Examination Guidelines: A complete evaluation includes B-mode imaging, spectral Doppler, color Doppler,  and power Doppler as needed of all accessible portions of each vessel. Bilateral testing is considered an integral part of a complete examination. Limited examinations for reoccurring indications may be performed as noted. The reflux portion of the exam is performed with the patient in reverse Trendelenburg.  +---------+---------------+---------+-----------+----------+--------------+  RIGHT     Compressibility Phasicity Spontaneity Properties Thrombus Aging  +---------+---------------+---------+-----------+----------+--------------+  CFV       Full            Yes       Yes                                    +---------+---------------+---------+-----------+----------+--------------+  SFJ       Full                                                             +---------+---------------+---------+-----------+----------+--------------+  FV Prox   Full                                                             +---------+---------------+---------+-----------+----------+--------------+  FV Mid    Full                                                             +---------+---------------+---------+-----------+----------+--------------+  FV Distal Full                                                             +---------+---------------+---------+-----------+----------+--------------+  PFV       Full                                                             +---------+---------------+---------+-----------+----------+--------------+  POP       Full            Yes       Yes                                    +---------+---------------+---------+-----------+----------+--------------+  PTV       Full                                                             +---------+---------------+---------+-----------+----------+--------------+  PERO      Full                                                             +---------+---------------+---------+-----------+----------+--------------+    +---------+---------------+---------+-----------+----------+--------------+  LEFT      Compressibility Phasicity Spontaneity Properties Thrombus Aging  +---------+---------------+---------+-----------+----------+--------------+  CFV       Full            Yes       Yes                                    +---------+---------------+---------+-----------+----------+--------------+  SFJ       Full                                                             +---------+---------------+---------+-----------+----------+--------------+  FV Prox   Full                                                             +---------+---------------+---------+-----------+----------+--------------+  FV Mid    Full                                                             +---------+---------------+---------+-----------+----------+--------------+  FV Distal Full                                                             +---------+---------------+---------+-----------+----------+--------------+  PFV       Full                                                             +---------+---------------+---------+-----------+----------+--------------+  POP       Full            Yes       Yes                                    +---------+---------------+---------+-----------+----------+--------------+  PTV       Full                                                             +---------+---------------+---------+-----------+----------+--------------+  PERO      Full                                                             +---------+---------------+---------+-----------+----------+--------------+   Summary: BILATERAL: - No evidence of deep vein thrombosis seen in the lower extremities, bilaterally. -No evidence of popliteal cyst, bilaterally. RIGHT: - No cystic structure found in the popliteal fossa.  LEFT: - No cystic structure found in the popliteal fossa.  *See table(s) above for measurements and observations. Electronically signed by Harold Barban MD on 02/15/2021 at 9:07:46 PM.    Final    Telemetry    02/16/21 NSR with intermittent PVCs - Personally Reviewed  ECG    No new tracing as of 02/16/21 - Personally Reviewed  Cardiac Studies    Echocardiogram 02/02/2021:   1. Left ventricular ejection fraction, by estimation, is 65 to 70%. The  left ventricle has normal function. The left ventricle has no regional  wall motion abnormalities. Left ventricular diastolic parameters are  consistent with Grade I diastolic  dysfunction (impaired relaxation).   2. Right ventricular systolic function is normal. The right ventricular  size is mildly enlarged. There is normal pulmonary artery systolic  pressure.   3. Left atrial size was mild to moderately dilated.   4. The mitral valve is normal in structure. Mild mitral valve  regurgitation. No evidence of mitral stenosis. Moderate mitral annular  calcification.   5. Tricuspid valve regurgitation is mild to moderate.   6. The aortic valve is normal in structure. Aortic valve regurgitation is  not visualized. No aortic stenosis is present.   7. The inferior vena cava is normal in size with greater than 50%  respiratory variability, suggesting right  atrial pressure of 3 mmHg.    Vasc US 02/14/2021 Summary:  Right Carotid: Velocities in the right ICA are consistent with a 40-59%                 stenosis.   Left Carotid: Velocities in the left ICA are consistent with a 1-39%  stenosis.   Vertebrals:  Right vertebral artery demonstrates antegrade flow. Left  vertebral               artery demonstrates bidirectional flow.  Subclavians: Normal flow hemodynamics were seen in bilateral subclavian               arteries.   Patient Profile     80 y.o. female with a hx of hypertension, diabetes, mention of carotid artery disease (no report), irritable bowel syndrome, hypothyroidism who was admitted on 02/11/2021 with what appears to be recurrent vasovagal syncope.  Assessment & Plan     1. Recurrent vasovagal syncope: -Reports three episodes with prodromal symptoms including blurred vision and dizziness with two episodes of complete syncope -Found to have hyponatremia and hypokalemia on presentation with QTC prolongation that has since normalized.  -Seen by cardiology who feels symptoms are likely related to profound hypovolemia>>vasobagal response -Dopplers with RICA at 40-59% that is not the cause of her symptoms  -Recommendations were to avoid thiazide diuretics>>increase oral hydration   2. HTN: -Holding antihypertensives -BPs stable  3. PVC: -K+ at 3.5<<will add supplemental K today>>likely dilutional hypokalemia   Signed, Kathyrn Drown NP-C [place] Pager: [phone] 02/16/2021, 6:43 AM    Personally seen and examined. Agree with APP above with the following comments: Briefly 80 yo F with multiple falls concerning for vasovagal syncope Patient notes that she has had no symptoms in last 24 hours and has been able to walk Exam notable for elevated BP 140s/80s. Personally reviewed relevant tests; prior NM Stress and Echo WNL; no evidence of arrhythmia Would recommend  Orthostatic Hypotension - increase in salt and water intake - DC off thiazide without resumption of prior CCB - gave education on slow rise, Valsalva maneuver exacerbation, temperature change - discussed muscle contraction and leg crossing - discussed elevation to 30-45 degrees when sleeping - could be a candidate for midodrine 5 mg PO TID if unable to treat with conservative measures  Patient is planned for discharge 02/16/21 after talking with nutrition - we are working to schedule outpatient follow up  Rudean Haskell, [place]  [address], [address] [place], [place] [address] [phone]  1:02 PM   For questions or updates, please contact   Please consult [email or web address] for contact info under Cardiology/STEMI.

## 2021-02-16 NOTE — Progress Notes (Signed)
Occupational Therapy Treatment Patient Details Name: Christina Sellers MRN: [id number] DOB: [birth year] Today's Date: 02/16/2021    History of present illness Pt is an 80 y.o. female admitted 02/11/21 with syncope (3x episodes in the past 3 peaks), admitted for further evaluation of syncope with PEA arrest. Workup for UTI, hypovolemia, hyponatremia. Course complicated by additional syncopal episodes; suspect vasovagal response. PMH includes DM, CHF, PVD, HTN, IBS, gout.   OT comments  Pt making steady progress towards OT goals this session. Pt received seated in recliner awaiting DC home therefore session focus on education related to safe shower transfer set-up to 3n1 and decreasing risk of falls at home. Provided suggestions and handout on setting up pts home to decrease fall risk with pt and pts daughter verbalizing understanding. Demo'ed compensatory method of donning TED hose with pt verbalizing understanding. Pt would continue to benefit from skilled occupational therapy while admitted and after d/c to address the below listed limitations in order to improve overall functional mobility and facilitate independence with BADL participation. DC plan remains appropriate, will follow acutely per POC.     Follow Up Recommendations  Home health OT;Supervision/Assistance - 24 hour;Other (comment) (initially)    Equipment Recommendations  3 in 1 bedside commode    Recommendations for Other Services      Precautions / Restrictions Precautions Precautions: Fall;Other (comment) Precaution Comments: monitor vitals; prior sessions with dizziness/syncope (did not occur 3/14) Restrictions Weight Bearing Restrictions: No       Mobility Bed Mobility               General bed mobility comments: pt OOB in recliner and returned to recliner at end of session    Transfers                      Balance                                           ADL either performed  or assessed with clinical judgement   ADL Overall ADL's : Needs assistance/impaired                       Lower Body Dressing Details (indicate cue type and reason): education provided on compensatory methods for donning TED hose with demonstration provided, additonally provided education on AE available to assist with donning           Tub/Shower Transfer Details (indicate cue type and reason): handout and demonstration provided on completing safe shower transfer to 3n1 in walkin shower   General ADL Comments: session focus on education related to decreasing fall risk at home and safe set- up of 3n1 in walkin shower     Vision       Perception     Praxis      Cognition Arousal/Alertness: Awake/alert Behavior During Therapy: WFL for tasks assessed/performed Overall Cognitive Status: Within Functional Limits for tasks assessed                                          Exercises     Shoulder Instructions       General Comments VSS, pts daughter present during session. Issued pt handout on decreasing fall risks at home and handout on safe shower transfer  to 3n1.    Pertinent Vitals/ Pain       Pain Assessment: No/denies pain  Home Living                                          Prior Functioning/Environment              Frequency  Min 2X/week        Progress Toward Goals  OT Goals(current goals can now be found in the care plan section)  Progress towards OT goals: Progressing toward goals  Acute Rehab OT Goals Patient Stated Goal: To go home OT Goal Formulation: With patient/family Time For Goal Achievement: 02/27/21 Potential to Achieve Goals: Good  Plan Discharge plan remains appropriate;Frequency remains appropriate    Co-evaluation                 AM-PAC OT "6 Clicks" Daily Activity     Outcome Measure   Help from another person eating meals?: None Help from another person taking care of  personal grooming?: A Little Help from another person toileting, which includes using toliet, bedpan, or urinal?: A Little Help from another person bathing (including washing, rinsing, drying)?: A Lot Help from another person to put on and taking off regular upper body clothing?: None Help from another person to put on and taking off regular lower body clothing?: A Lot 6 Click Score: 18    End of Session    OT Visit Diagnosis: Other abnormalities of gait and mobility (R26.89);Repeated falls (R29.6);History of falling (Z91.81);Muscle weakness (generalized) (M62.81)   Activity Tolerance Patient tolerated treatment well   Patient Left in chair;with call bell/phone within reach;with family/visitor present   Nurse Communication Mobility status        Time: 9798-9211 OT Time Calculation (min): 12 min  Charges: OT General Charges $OT Visit: 1 Visit OT Treatments $Self Care/Home Management : 8-22 mins  Adaisha Campise., COTA/L Acute Rehabilitation Services [place] 02/16/2021, 4:21 PM

## 2021-02-16 NOTE — Telephone Encounter (Signed)
Pt was admitted with syncope- will remove from pool for now.  Kerin Ransom PA-C 02/16/2021 8:48 AM

## 2021-02-16 NOTE — Discharge Instructions (Signed)
Keep your self hydrated.  We stop several of your medications to avoid low Blood pressure and dehydration.  Please follow up with your PCP to have electrolytes check and monitor Your Blood pressure.   Syncope Syncope is when you pass out (faint) for a short time. It is caused by a sudden decrease in blood flow to the brain. Signs that you may be about to pass out include:  Feeling dizzy or light-headed.  Feeling sick to your stomach (nauseous).  Seeing all white or all black.  Having cold, clammy skin. If you pass out, get help right away. Call your local emergency services (911 in the [place] Do not drive yourself to the hospital. Follow these instructions at home: Watch for any changes in your symptoms. Take these actions to stay safe and help with your symptoms: Lifestyle  Do not drive, use machinery, or play sports until your doctor says it is okay.  Do not drink alcohol.  Do not use any products that contain nicotine or tobacco, such as cigarettes and e-cigarettes. If you need help quitting, ask your doctor.  Drink enough fluid to keep your pee (urine) pale yellow. General instructions  Take over-the-counter and prescription medicines only as told by your doctor.  If you are taking blood pressure or heart medicine, sit up and stand up slowly. Spend a few minutes getting ready to sit and then stand. This can help you feel less dizzy.  Have someone stay with you until you feel stable.  If you start to feel like you might pass out, lie down right away and raise (elevate) your feet above the level of your heart. Breathe deeply and steadily. Wait until all of the symptoms are gone.  Keep all follow-up visits as told by your doctor. This is important. Get help right away if:  You have a very bad headache.  You pass out once or more than once.  You have pain in your chest, belly, or back.  You have a very fast or uneven heartbeat (palpitations).  It hurts to  breathe.  You are bleeding from your mouth or your bottom (rectum).  You have black or tarry poop (stool).  You have jerky movements that you cannot control (seizure).  You are confused.  You have trouble walking.  You are very weak.  You have vision problems. These symptoms may be an emergency. Do not wait to see if the symptoms will go away. Get medical help right away. Call your local emergency services (911 in the [place] Do not drive yourself to the hospital. Summary  Syncope is when you pass out (faint) for a short time. It is caused by a sudden decrease in blood flow to the brain.  Signs that you may be about to faint include feeling dizzy, light-headed, or sick to your stomach, seeing all white or all black, or having cold, clammy skin.  If you start to feel like you might pass out, lie down right away and raise (elevate) your feet above the level of your heart. Breathe deeply and steadily. Wait until all of the symptoms are gone. This information is not intended to replace advice given to you by your health care provider. Make sure you discuss any questions you have with your health care provider. Document Revised: 01/02/2020 Document Reviewed: 01/04/2018 Elsevier Patient Education  2021 [employer].  If you are interested in a following a general, healthful diet or if your registered dietitian nutritionist (RDN) or doctor has recommended it  to you, this guide can help provide you with the basic knowledge you'll need. The general healthful diet can be tailored to your personal preferences. There are several benefits to following a general, healthful diet: . Depending on your food choices, it could mean less calories, less salt, less added sugars, and less saturated fat and trans fat than many other diets.  . When you focus on eating more whole grains, legumes, fruits, vegetables, nuts, and seeds, you may improve how much fiber, vitamins, and minerals you eat.  . It can lower  your risk of conditions like diabetes, heart disease, hypertension, stroke, and cancer.   Tips . Eat consistently - 3 meals/3 snacks OR 6 small meals per day. . Eat at least 5 servings of fruits and vegetables every day.  . Don't focus only on green vegetables. There are special health benefits to eating blue-purple, yellow, orange, and red vegetables.  . Eat more legumes (like beans and lentils) and more whole grains.  . Try meatless alternatives.  . In place of meat, you can get your protein from eating eggs, fish, poultry, beans, peas, soy-based foods, and nuts/nut butters  . Low-fat or fat-free dairy products are also good sources of protein. Marland Kitchen Keep your salt intake to a minimum (less than 2300 milligrams per day).  . Avoid adding salt, soy sauce or fish sauce to your food when cooking.  . Eat freshly prepared meals at home. Processed foods and restaurant foods contain more salt.  . Fresh fruits and vegetables are the best choices for snacks.  . When shopping, choose the products with lower sodium content. . Limit your daily sugar intake.  . Sugar can be found in honey, syrups, jelly, fruit juice, and fruit juice concentrate.  . Limit sugar-sweetened beverages like soda pop and fruit juice, sugary snacks, and candy  . It's best to avoid products with added sugar, but if you do eat them, read labels carefully so you know how much sugar is in each portion.  . It is better to eat unsaturated fats than saturated fats. Avoid trans fats as much as possible.  Marland Kitchen Unsaturated fat is found in fish, avocado, nuts, and oils like sunflower, canola, and olive oils.  . Saturated fat is found in fatty meat, butter, ice cream, palm and coconut oil, cream, cheese, and lard.  . Trans fats are found in many processed foods, margarines, fried foods, fast food items, convenience foods like frozen pizza and snack foods, and sweets including pies, cookies, and other pastries. Check nutrition labels.  . When  cooking, use vegetable oil instead of animal oil.  . Boil, steam, or bake your food instead of frying.  . If you eat meat, remove the fatty part before cooking.  Foods Recommended Include a variety of the following whole foods. Choose a healthful balance of foods from each category at your meals. Be sure the meals don't exceed your recommended calorie limit so you can achieve and/or maintain a healthy weight.  Food Group Foods Recommended  Grains Choose whole grains for at least half of grain selections, including whole wheat, barley, rye, buckwheat, corn, teff, quinoa, millet, amaranth, brown and wild rice, sorghum, and oats Focus on intact cooked whole grains Choose grain products, such as bread, rolls, prepared breakfast cereals, crackers, and pasta made from whole grains that are low in added sugars, saturated fat, and sodium  Protein Foods Fresh or frozen red meat, including lean, trimmed cuts of beef, pork, or lamb a few  times per week or less; avoid processed meats, such as bacon, sausage, and ham Fresh or frozen poultry, including skinless chicken or [place], avoid processed meats that are higher in sodium Fresh, frozen, or canned seafood, including fish, shrimp, lobster, clams, and scallops at least twice per week. Focus on fatty fish, such as salmon, herring, and sardines, as a rich source of omega-3 fatty acids, and limit those which have a greater risk for contamination, including king mackerel, shark, and tilefish Eggs Nuts and seeds, such as peanuts, almonds, pistachios, and sunflower seeds (unsalted varieties) Nut and seed butters, such as peanut butter, almond butter, and sunflower seed butter, (reduced-sodium varieties)  Soy foods, such as tofu, tempeh, or soy nuts Meat alternatives, such as veggie burgers, and sausages based on plant protein (reduced-sodium varieties) Unsalted legumes, such as dried beans, lentils, or peas at least a few times per week in place of other protein  sources  Dairy Low-fat or fat-free milk, yogurt (low in added sugars), cottage cheese, and cheeses Frozen desserts made from low-fat milk that are low in added sugars (no more than 5 grams added sugars per serving) Fortified soymilk  Vegetables A variety of fresh, frozen, and canned (unsalted) whole vegetables, including dark-green, red and orange vegetables, legumes (beans and peas), and starchy vegetables; low-sodium vegetable juices  Fruits A variety of fresh, frozen, canned and dried, whole unsweetened fruits canned fruit packed in water or fruit juice without added sugar) 100% fruit juice (limited to one serving per day)  Oils and Fats Use in moderation, up to 5 servings per day: Unsaturated vegetable oils, including olive, peanut, and canola oils Margarines and spreads, which list liquid vegetable oil as the first ingredient and do not contain trans fats (partially hydrogenated oil) Salad dressing and mayonnaise made from unsaturated vegetable oils  Beverages Coffee, tea (unsweetened), water, 100% fruit juice (limited to one serving per day) Avoid sweetened beverages, including soda, sweetened tea, sports drinks, energy drinks, and coffee drinks  Other Prepared foods, including soups, casseroles, salads, baked goods, and snacks made from recommended ingredients, with low levels of added saturated fat, added sugars, or salt   Foods in Moderation The following foods should be included occasionally, if at all.  Food Group Foods to Have in Moderation  Grains Sweetened, low-fiber breakfast cereals (less than 2 grams of fiber per serving) Packaged (high sugar, refined ingredients) baked goods Snack crackers and chips made of refined ingredients, cheese crackers, butter crackers Breads made with refined ingredients and saturated fats, such as biscuits, frozen waffles, sweet breads, doughnuts, pastries, packaged baking mixes, pancakes, cakes, and cookies  Protein Foods Marbled or fatty red meats  (beef, pork, lamb), such as ribs Processed red meats, such as bacon, sausage, and ham Poultry (chicken and [place]) with skin Fried meats, poultry, or fish Deli meats, such as pastrami, bologna, or salami (made of meat or poultry) Fried eggs Salted legumes, nuts, seeds, or nut/seed butters Meat alternatives with high levels of sodium or saturated fat  Dairy Whole milk, cream, cheeses made from whole milk, sour cream Yogurt or ice cream made from whole milk or with added sugar Cream cheese made from whole milk  Vegetables Canned or frozen vegetables with salt, fresh vegetables prepared with salt Fried vegetables Vegetables in cream sauce or cheese sauce Tomato or pasta sauce with high levels of salt or sugar   Fruits Fruits packed in syrup or made with added sugar  Oils Solid shortening or partially hydrogenated oils Solid margarine made with  hydrogenated or partially hydrogenated oils Margarine that contains trans fats; butter  Beverages Sweetened drinks, including sweetened coffee or tea drinks, soda, energy drinks, and sports drinks  Alcohol (for adults >41 years of age) If you choose to drink, women should have no more than one drink per day and men should have no more than two per day (One drink is measured as 5 ounces wine; 12 ounces beer, 1.5 ounces spirits.)   Other  Sugary and/or fatty desserts, candy, and other sweets; salt and seasonings that contain salt Fried foods   General, Healthful Diet Sample 1-Day Menu View Nutrient Info  Breakfast 1 cup oatmeal  1/2 cup blueberries  1 ounce almonds  1 cup low-fat or fat-free milk  1 cup coffee  Lunch 2 slices whole wheat bread  3 ounces [place] slices  1/4 cup lettuce for sandwich  2 slices tomato for sandwich  1 ounce reduced-fat, reduced sodium cheese  1/2 cup fresh carrot sticks  1/4 cup hummus  1 banana  1 cup milk  1 cup unsweetened tea  Evening Meal 4 ounces baked salmon with basil  1 cup quinoa  1 cup green beans  1  cup mixed greens salad  1 teaspoon olive oil mixed with vinegar of choice  1 whole wheat dinner roll  1 teaspoon margarine (for roll)  1/2 cup applesauce  1 cup water  Evening Snack 1 cup low-fat yogurt  1/2 cup sliced peaches   Continue with Ensure daily (2-3 bottles) until oral intake is consistent. Consider 1-2 bottles after consistent oral nutrition for added protein, vitamins, and minerals. When sick, try to have 3 bottles of Ensure and Pedialyte to keep electrolytes and nutrition stable.

## 2021-02-16 NOTE — Care Management Important Message (Signed)
Important Message  Patient Details  Name: Christina Sellers MRN: [id number] Date of Birth: 02-Oct-1941   Medicare Important Message Given:  Yes     Shelda Altes 02/16/2021, 10:24 AM

## 2021-02-16 NOTE — Progress Notes (Signed)
Physical Therapy Treatment Patient Details Name: Shivaun Bilello MRN: [id number] DOB: 25-Aug-1941 Today's Date: 02/16/2021    History of Present Illness Pt is an 80 y.o. female admitted 02/11/21 with syncope (3x episodes in the past 3 peaks), admitted for further evaluation of syncope with PEA arrest. Workup for UTI, hypovolemia, hyponatremia. Course complicated by additional syncopal episodes; suspect vasovagal response. PMH includes DM, CHF, PVD, HTN, IBS, gout.   PT Comments    Pt progressing with mobility. Demonstrates improved stability with transfers and ambulation; supervision for safety due to fall risk and prior syncopal episodes. Pt asymptomatic with activity this session; VSS on RA (HR 65-87, post-mobility BP 137/56). Educ pt re: strategies to prevent vasovagal response with various activities, activity recommendations, fall risk reduction. Pt hopeful for d/c home today.    Follow Up Recommendations  Home health PT;Supervision - Intermittent     Equipment Recommendations  3in1 (PT)    Recommendations for Other Services       Precautions / Restrictions Precautions Precautions: Fall;Other (comment) Precaution Comments: monitor vitals; prior sessions with dizziness/syncope (did not occur 3/14) Restrictions Weight Bearing Restrictions: No    Mobility  Bed Mobility Overal bed mobility: Modified Independent Bed Mobility: Supine to Sit     Supine to sit: Modified independent (Device/Increase time);HOB elevated          Transfers Overall transfer level: Needs assistance Equipment used: Rolling walker (2 wheeled) Transfers: Sit to/from Stand Sit to Stand: Supervision         General transfer comment: Able to stand from bed and low toilet to RW with supervision for safety  Ambulation/Gait Ambulation/Gait assistance: Supervision Gait Distance (Feet): 40 Feet Assistive device: Rolling walker (2 wheeled) Gait Pattern/deviations: Step-through pattern;Decreased  stride length Gait velocity: Decreased   General Gait Details: Ambulation throughout room with RW and supervision for safety/fall risk; pt denies dizziness; pt declined hallway ambulation secondary to having not eaten breakfast yet   Stairs             Wheelchair Mobility    Modified Rankin (Stroke Patients Only)       Balance Overall balance assessment: Needs assistance Sitting-balance support: No upper extremity supported;Feet supported Sitting balance-Leahy Scale: Good     Standing balance support: No upper extremity supported;During functional activity Standing balance-Leahy Scale: Fair Standing balance comment: Can static stand without UE support for ADL tasks at sink (washing hands, washing face); dynamic stability improved with RW                            Cognition Arousal/Alertness: Awake/alert Behavior During Therapy: WFL for tasks assessed/performed Overall Cognitive Status: Within Functional Limits for tasks assessed                                 General Comments: WFL for simple tasks, not formally assessed      Exercises Other Exercises Other Exercises: 5x repeated sit<>stand with UE support (11.9 sec)    General Comments General comments (skin integrity, edema, etc.): HR 65-87; post-ambulation BP 137/56      Pertinent Vitals/Pain Pain Assessment: No/denies pain Pain Intervention(s): Monitored during session    Home Living                      Prior Function            PT Goals (current goals can  now be found in the care plan section) Progress towards PT goals: Progressing toward goals    Frequency    Min 3X/week      PT Plan Current plan remains appropriate    Co-evaluation              AM-PAC PT "6 Clicks" Mobility   Outcome Measure  Help needed turning from your back to your side while in a flat bed without using bedrails?: None Help needed moving from lying on your back to sitting  on the side of a flat bed without using bedrails?: None Help needed moving to and from a bed to a chair (including a wheelchair)?: A Little Help needed standing up from a chair using your arms (e.g., wheelchair or bedside chair)?: A Little Help needed to walk in hospital room?: A Little Help needed climbing 3-5 steps with a railing? : A Little 6 Click Score: 20    End of Session   Activity Tolerance: Patient tolerated treatment well Patient left: in chair;with call bell/phone within reach;with chair alarm set Nurse Communication: Mobility status PT Visit Diagnosis: Other abnormalities of gait and mobility (R26.89);Difficulty in walking, not elsewhere classified (R26.2);Pain Pain - Right/Left: Right Pain - part of body: Hip     Time: 9811-9147 PT Time Calculation (min) (ACUTE ONLY): 21 min  Charges:  $Therapeutic Exercise: 8-22 mins                     Mabeline Caras, PT, DPT Acute Rehabilitation Services  Pager [phone] Office [place] 02/16/2021, 12:16 PM

## 2021-02-16 NOTE — TOC Transition Note (Signed)
Transition of [facility]) - CM/SW Discharge Note   Patient Details  Name: Keli Buehner MRN: [id number] Date of Birth: [birth year]  [facility] of [facility]) CM/SW Contact:  Pollie Friar, RN Phone Number: 02/16/2021, 4:01 PM   Clinical Narrative:    Patient discharging home with [facility] services through [place]. Cory with [facility] aware of d/c home. Daughter has already taken 3 in 1 home.  Information on emergency response systems for home provided to the daughter.  Pt has recommended supervision at home and transportation to home.   Final next level of care: [place] Barriers to Discharge: No Barriers Identified   Patient Goals and CMS Choice Patient states their goals for this hospitalization and ongoing recovery are:: to return home CMS Medicare.gov Compare Post Acute Care list provided to:: Patient Choice offered to / list presented to : [place]  Discharge Placement                       Discharge Plan and Services In-house Referral: NA Discharge Planning Services: CM Consult Post Acute Care Choice: Home Health          DME Arranged: 3-N-1 DME Agency: [employer] Date DME Agency Contacted: 02/13/21 Time DME Agency Contacted: [phone] Representative spoke with at DME Agency: Sheila [place]: PT,OT,RN,Social Work [place]: [place] Date [place]: 02/13/21 Time [place]: 53 Representative spoke with at [place]: [place] ([place]) Interventions     Readmission Risk Interventions No flowsheet data found.

## 2021-02-16 NOTE — Progress Notes (Signed)
Initial Nutrition Assessment  DOCUMENTATION CODES:  Non-severe (moderate) malnutrition in context of chronic illness  INTERVENTION:  Pt to d/c today, but if remains inpatient: Add Ensure Enlive po TID, each supplement provides 350 kcal and 20 grams of protein.  Consider liberalizing diet to regular to promote PO intake.  Add MVI with minerals daily.  NUTRITION DIAGNOSIS:  Moderate Malnutrition related to chronic illness (CHF, COPD, IBS) as evidenced by moderate muscle depletion,mild muscle depletion,mild fat depletion.  GOAL:  Patient will meet greater than or equal to 90% of their needs  MONITOR:  PO intake,Supplement acceptance  REASON FOR ASSESSMENT:  Consult Diet education,Assessment of nutrition requirement/status  ASSESSMENT:  80 yo female with a PMH of mild mitral regurgitation, T2DM, hypothyroidism, gout, COPD with asthma, CHF, GERD, HTN, and IBS who presents with syncope with hyponatremia and hypokalemia.  Spoke with pt and daughter at bedside. Answered questions regarding the best ways to promote a general healthful diet to avoid low electrolytes and glucose levels to aid in syncope events.  Recommended 6 small meals per day with a protein and carb at each meal. Encouraged snacks and Ensure at home. Told her not to worry too much about glucose levels right now, as it matters for her to heal and get better. Reminded and encouraged that a general healthful diet will provide the electrolytes she needs.  Wt stable for the past year per Epic. Pt reports weight loss recently, 5 lbs in the last 2 weeks.  Encouraged Ensure intake while here and at home. Encouraged PO intake to promote healing and to promote muscle and fat growth.  Relevant Medications: SSI, synthroid, Klor-Con 20 mEq, Vitamin B12 1000 mcg, sucralfate Labs: reviewed; Glucose 90 HbA1c: 5.8% (01/2021)  NUTRITION - FOCUSED PHYSICAL EXAM: Flowsheet Row Most Recent Value  Orbital Region Mild depletion  Upper  Arm Region Mild depletion  Thoracic and Lumbar Region Mild depletion  Buccal Region Moderate depletion  Temple Region Moderate depletion  Clavicle Bone Region Mild depletion  Clavicle and Acromion Bone Region Mild depletion  Scapular Bone Region Mild depletion  Dorsal Hand Mild depletion  Patellar Region Moderate depletion  Anterior Thigh Region Mild depletion  Posterior Calf Region Moderate depletion  Edema (RD Assessment) Mild  Hair Reviewed  Eyes Reviewed  [pale conjuctiva]  Mouth Reviewed  Skin Reviewed  Nails Reviewed     Diet Order:   Diet Order            Diet heart healthy/carb modified Room service appropriate? Yes; Fluid consistency: Thin  Diet effective now                EDUCATION NEEDS:  Education needs have been addressed  Skin:  Skin Assessment: Reviewed RN Assessment (Wound on left arm)  Last BM:  02/16/21  Height:  Ht Readings from Last 1 Encounters:  02/14/21 4\' 11"  (1.499 m)   Weight:  Wt Readings from Last 1 Encounters:  02/16/21 59.6 kg   Ideal Body Weight:  44.7 kg  BMI:  Body mass index is 26.54 kg/m.  Estimated Nutritional Needs:  Kcal:  1700-1900 Protein:  85-100 grams Fluid:  >2 L  Christina Sellers, RD, LDN Registered Dietitian After Hours/Weekend Pager # in [place]

## 2021-02-16 NOTE — Discharge Summary (Signed)
Physician Discharge Summary  Christina Sellers VFI:[id number] DOB: 17-Aug-1941 DOA: 02/11/2021  PCP: Josetta Huddle, MD  Admit date: 02/11/2021 Discharge date: 02/16/2021  Admitted From: Home  Disposition: Home with [facility]  Recommendations for Outpatient Follow-up:  1. Follow up with PCP in 1-2 weeks 2. Please obtain BMP/CBC in one week 3. Needs close follow up with Cardiology for further care of syncope/Orhtostatic  4. Needs B-met to follow Potasium level.    Home Health: Yes.  Equipment/Devices: DME 3;1  Discharge Condition: Stable.  CODE STATUS: Full code Diet recommendation: Regular  Brief/Interim Summary: 80 year old with past medical history significant for mild mitral regurgitation, diabetes who presented to the ED after syncope episode at the cardiology clinic.  Patient has had 3 syncope episode over the last 2 weeks.  Patient had one episode that she lost consciousness, briefly she underwent chest compression, came to.  Felt to be pulseless initially.   Patient admitted for further evaluation of syncope/PEA arrest at  Cardiology office.  Was found to be hyponatremic with a sodium level at 121, cortisol level was low at 5.2.  CT angio negative for PE.   1-Syncope/PEA arrest: -Vaso-Vagal Syncope episode related to Dehydration, Hypovolemia in setting of diuretics, Diarrheal illness.  -Continue to monitor on telemetry. No arrhythmia.  -Adrenal insufficiency was ruled out with normal ACTH test.  -Carotid doppler: right 40--59 % stenosis. Continue with crestor. Does not explain syncope.  -Discussed with Neurologist 3-12: MRI last admission negative for Acute  stroke, patient recover after event, no confusion, no tongue bite or urinary incontinence unlikely seizures.  -Appreciate cardiology evaluation, patient likely had Vaso-vagal syncope.  -Treated with IV fluids. Orthostatic vitals positive 3/13, received fluids. She is not symptomatic today, denies dizziness. She has been clear for  discharge by cardiology.   2-Hyponatremia; -Suspect related to Maxzide.  -Improved with IV fluids.  -Sodium down to 133- 3-13. Received IV fluids. Sodium increase to 136.  -Per cardiology ok to increase sodium/ water intake.    3-Hypertension:  Holding triamterene/hydrochlorothiazide, Norvasc  metoprolol due to hypotension. Will avoid indefinitely hydrochlorothiazide/triamterene. Monitor BP, BP fluctuates.  Permissive elevation of BP.  Close follow up with PCP and cardiology for further adjustment of medications.   4-Hypothyroidism Free T4 mildly elevated.  I have reduce total dose of Synthroid to 125 mcg from 150  5-Hyperlipidemia: Continue with Crestor.  6-Prolonged QT:  QT improved, down to  469.  7-Nausea, Diarrhea, history of IBS: Continue with  Carafate.   Reported Constipation. On miralax.  Report a history of intermittent Diarrhea and constipation.  GI has been consulted, follow up out patient.  CT abdomen pelvis; only shows diverticulosis.   8-Hypokalemia: Low normal range. Discharge on 20 meq kcl supplement.   9-Metabolic acidosis: Related to recent diarrhea illness: Improved with IV fluids 10-Pre -DM ; Will continue to hold metformin at discharge. Patient CBG has been 70--130. She was mildly symptomatic with CBG of 70. Nutritionist consulted.  11-Adrenal Insufficiency was rule out.  12-Bilateral LE edema; TED hose, elevate legs. Doppler:  report negative for DVT 13-nutrition consulted.  14-Night sweats:  She report a history of night swats after  Injection on her back. Think is getting less frequents.  -Her synthroid was recently adjusted. Monitor.  -No enlarge abdominal or pelvis Lymph nodes.  No enlarge Lymph node on recent CT angi neither.  -No fever, or leukocytosis this admission. Nor leukopenia/  -If persist will need further evaluation by PCP.   15-Diphtheroids Blood culture 2/27: 1 of 2  Blood culture. Discussed with ID that was likely a  contaminate/   Estimated body mass index is 26.46 kg/m as calculated from the following:   Height as of this encounter: 4' 11" (1.499 m).   Weight as of this encounter: 59.4 kg.   Discharge Diagnoses:  Active Problems:   Syncope   IBS (irritable bowel syndrome)   Hyponatremia   Syncope, unspecified syncope type    Discharge Instructions  Discharge Instructions    Diet - low sodium heart healthy   Complete by: As directed    Discharge wound care:   Complete by: As directed    as above   Increase activity slowly   Complete by: As directed      Allergies as of 02/16/2021      Reactions   Tape Other (See Comments)   SKIN IS VERY THIN- TAPE TEARS VERY EASILY!!   Celexa [citalopram Hydrobromide] Other (See Comments)   Nightmares   Fluconazole Other (See Comments)   Unknown reaction   Green Tea Leaf Ext Other (See Comments)   Mouth ulcers   Kenalog [triamcinolone] Other (See Comments)   Spinal injection caused sweats   Levaquin [levofloxacin] Hives   Losartan Potassium-hctz Other (See Comments)   Night sweats/ fatigue   Methocarbamol Other (See Comments)   500 mg is too much- can tolerate a lesser dose   Nitrofurantoin Macrocrystal Other (See Comments)   Possible vertigo   Scopolamine Other (See Comments)   Memory change   Sulfa Antibiotics Other (See Comments)   Reaction not recalled by the patient   Trimethoprim Hives      Medication List    STOP taking these medications   acetaminophen 650 MG CR tablet Commonly known as: TYLENOL Replaced by: acetaminophen 325 MG tablet   amLODipine 5 MG tablet Commonly known as: NORVASC   metFORMIN 500 MG tablet Commonly known as: GLUCOPHAGE   metoprolol succinate 25 MG 24 hr tablet Commonly known as: TOPROL-XL   NON FORMULARY   triamterene-hydrochlorothiazide 37.5-25 MG tablet Commonly known as: MAXZIDE-25     TAKE these medications   acetaminophen 325 MG tablet Commonly known as: TYLENOL Take 2 tablets  (650 mg total) by mouth every 4 (four) hours as needed for headache or mild pain. Replaces: acetaminophen 650 MG CR tablet   ascorbic acid 500 MG tablet Commonly known as: VITAMIN C Take 500 mg by mouth daily.   BIOFREEZE EX Apply 1 application topically 2 (two) times daily as needed (for knee pain).   blood glucose meter kit and supplies Kit Dispense based on patient and insurance preference. Use up to four times daily as directed.   denosumab 60 MG/ML Sosy injection Commonly known as: PROLIA Inject 60 mg into the skin every 6 (six) months.   diclofenac sodium 1 % Gel Commonly known as: VOLTAREN Apply 1 application topically 2 (two) times daily as needed (to painful sites).   feeding supplement Liqd Take 237 mLs by mouth 2 (two) times daily between meals. Start taking on: February 17, 2021   fluticasone 50 MCG/ACT nasal spray Commonly known as: FLONASE Place 1 spray into both nostrils daily as needed (for seasonal allergies).   levothyroxine 25 MCG tablet Commonly known as: SYNTHROID Take 1-4 tablets (25-100 mcg total) by mouth See admin instructions. Take 25 mcg by mouth in the morning before breakfast on Sun/Tues/Thurs/Sat and 100 mcg on Mon/Wed/Fri What changed:   medication strength  how much to take  additional instructions   METAMUCIL PO  Take 1 Scoop by mouth at bedtime as needed (constipation- mix and drink as directed).   methocarbamol 500 MG tablet Commonly known as: ROBAXIN Take 250 mg by mouth 3 (three) times daily as needed for muscle spasms (or back pain).   Methylcobalamin 1000 MCG Tbdp Take 1,000 mcg by mouth daily.   ondansetron 4 MG disintegrating tablet Commonly known as: Zofran ODT Take 1 tablet (4 mg total) by mouth every 8 (eight) hours as needed for nausea or vomiting. What changed: reasons to take this   potassium chloride SA 20 MEQ tablet Commonly known as: KLOR-CON Take 1 tablet (20 mEq total) by mouth daily. Start taking on: February 17, 2021   Restasis 0.05 % ophthalmic emulsion Generic drug: cycloSPORINE Place 1 drop into both eyes 2 (two) times daily.   rosuvastatin 5 MG tablet Commonly known as: CRESTOR Take 5 mg by mouth daily.   sucralfate 1 g tablet Commonly known as: CARAFATE Take 1 tablet (1 g total) by mouth 4 (four) times daily -  with meals and at bedtime for 7 days.   Systane Overnight Therapy 0.3 % Gel ophthalmic ointment Generic drug: hypromellose Place 1 application into both eyes at bedtime as needed for dry eyes.   Vitamin B-12 1000 MCG Subl Place 1,000 mcg under the tongue daily.   Vitamin D3 50 MCG (2000 UT) capsule Take 2,000 mg by mouth daily.            Durable Medical Equipment  (From admission, onward)         Start     Ordered   02/13/21 1711  For home use only DME 3 n 1  Once        02/13/21 1711   02/13/21 1341  For home use only DME Bedside commode  Once       Question:  Patient needs a bedside commode to treat with the following condition  Answer:  General weakness   02/13/21 1342           Discharge Care Instructions  (From admission, onward)         Start     Ordered   02/16/21 0000  Discharge wound care:       Comments: as above   02/16/21 1358          Follow-up Information    [facility] Follow up.   Specialty: Home Health Services Why: Registered Nurse and Physical Therapy-office to call the patient for visit times.  Contact information: [address] [place] [address] [phone]        [facility] Follow up.   Why: Bedside commode to be delivered to the room prior to transition home.  Contact information: [place] [address] [phone]        Josetta Huddle, MD Follow up in 3 day(s).   Specialty: Internal Medicine Contact information: [address] [place] [address] [phone]        Werner Lean, MD .   Specialty: Cardiology Contact  information: [address] [place] [address] [phone]              Allergies  Allergen Reactions  . Tape Other (See Comments)    SKIN IS VERY THIN- TAPE TEARS VERY EASILY!!  . Celexa [Citalopram Hydrobromide] Other (See Comments)    Nightmares   . Fluconazole Other (See Comments)    Unknown reaction  .  Green Tea Leaf Ext Other (See Comments)    Mouth ulcers  . Kenalog [Triamcinolone] Other (See Comments)    Spinal injection caused sweats  . Levaquin [Levofloxacin] Hives  . Losartan Potassium-Hctz Other (See Comments)    Night sweats/ fatigue  . Methocarbamol Other (See Comments)    500 mg is too much- can tolerate a lesser dose  . Nitrofurantoin Macrocrystal Other (See Comments)    Possible vertigo  . Scopolamine Other (See Comments)    Memory change  . Sulfa Antibiotics Other (See Comments)    Reaction not recalled by the patient  . Trimethoprim Hives    Consultations:  Cardiology    Procedures/Studies: DG Lumbar Spine Complete  Result Date: 02/01/2021 CLINICAL DATA:  Syncopal episode 5 days ago EXAM: LUMBAR SPINE - COMPLETE 4+ VIEW COMPARISON:  August 08, 2020 FINDINGS: There are five non-rib bearing lumbar-type vertebral bodies. Levocurvature of the lumbar spine and dextrocurvature of the thoracolumbar junction, unchanged. Unchanged grade 1 anterolisthesis of L4-5. Unchanged mild wedging of T12. There is no evidence for acute fracture or subluxation. Severe intervertebral disc space height loss of T11-12, L3-4 and L2-3. Moderate intervertebral disc space height loss of L4-5 and L5-S1. Lower lumbar facet arthropathy. Atherosclerotic calcifications of the aorta. Surgical clips project over the RIGHT upper quadrant. IMPRESSION: 1. No acute osseous abnormality. 2. Unchanged multilevel degenerative disc disease, severe at T11-12, L3-4 and L2-3. 3. Unchanged grade 1 anterolisthesis of L4-5. Electronically Signed   By: Valentino Saxon MD   On:  02/01/2021 17:12   CT HEAD WO CONTRAST  Result Date: 02/01/2021 CLINICAL DATA:  Syncopal episode 5 days ago, left eye bruising, persistent nausea vomiting. EXAM: CT HEAD WITHOUT CONTRAST TECHNIQUE: Contiguous axial images were obtained from the base of the skull through the vertex without intravenous contrast. COMPARISON:  Head CT January 27, 2021 FINDINGS: Brain: No evidence of acute large vascular territory infarction, hemorrhage, hydrocephalus, extra-axial collection or mass lesion/mass effect. Mild parenchymal volume loss with ex vacuo dilatation of ventricular system. Similar periventricular white matter hypoattenuation, which likely represents chronic small vessel ischemic disease. Vascular: No hyperdense vessel. Vascular calcifications at the skull base. Skull: No acute osseous abnormality. Sinuses/Orbits: No acute finding. Other: Decreased left face and periorbital soft tissue swelling IMPRESSION: 1. No acute intracranial findings. 2. Decreased left face and periorbital soft tissue swelling. 3. Similar mild parenchymal volume loss and chronic small vessel ischemic disease. Electronically Signed   By: Dahlia Bailiff MD   On: 02/01/2021 14:16   CT Head Wo Contrast  Result Date: 01/27/2021 CLINICAL DATA:  Syncopal episode with a fall from standing. EXAM: CT HEAD WITHOUT CONTRAST CT MAXILLOFACIAL WITHOUT CONTRAST TECHNIQUE: Multidetector CT imaging of the head and maxillofacial structures were performed using the standard protocol without intravenous contrast. Multiplanar CT image reconstructions of the maxillofacial structures were also generated. COMPARISON:  None. FINDINGS: CT HEAD FINDINGS Brain: No evidence of acute infarction, hemorrhage, hydrocephalus, extra-axial collection or mass lesion/mass effect. There is mild cerebral volume loss with associated ex vacuo dilatation. Periventricular white matter hypoattenuation likely represents chronic small vessel ischemic disease. Vascular: There are  vascular calcifications in the carotid siphons. Skull: Normal. Negative for fracture or focal lesion. Other: There is a small right mastoid effusion. CT MAXILLOFACIAL FINDINGS Osseous: No fracture or mandibular dislocation. No destructive process. Orbits: Negative. No traumatic or inflammatory finding. Sinuses: There is left sphenoid sinus disease. Soft tissues: There is left face and periorbital soft tissue swelling. IMPRESSION: 1. No acute intracranial process. 2.  No evidence of acute facial bone fracture. Electronically Signed   By: Zerita Boers M.D.   On: 01/27/2021 20:29   CT ANGIO CHEST PE W OR WO CONTRAST  Result Date: 02/11/2021 CLINICAL DATA:  Syncope. EXAM: CT ANGIOGRAPHY CHEST WITH CONTRAST TECHNIQUE: Multidetector CT imaging of the chest was performed using the standard protocol during bolus administration of intravenous contrast. Multiplanar CT image reconstructions and MIPs were obtained to evaluate the vascular anatomy. CONTRAST:  24m OMNIPAQUE IOHEXOL 350 MG/ML SOLN COMPARISON:  None. FINDINGS: Cardiovascular: Moderate severity calcification of the aortic arch is seen. There is no evidence of aneurysmal dilatation or dissection. Satisfactory opacification of the pulmonary arteries to the segmental level. No evidence of pulmonary embolism. Normal heart size with mild to moderate severity coronary artery calcification. No pericardial effusion. Mediastinum/Nodes: No enlarged mediastinal, hilar, or axillary lymph nodes. Thyroid gland, trachea, and esophagus demonstrate no significant findings. Lungs/Pleura: Very mild atelectasis is seen within the posterior aspects of the bilateral lower lobes. There is no evidence of acute infiltrate, pleural effusion or pneumothorax. Upper Abdomen: Surgical clips are seen within the gallbladder fossa. Musculoskeletal: Multilevel degenerative changes seen throughout the thoracic spine. Review of the MIP images confirms the above findings. IMPRESSION: 1.   No  evidence of pulmonary embolism. 2.   Mild bilateral lower lobe atelectasis. 3.   Evidence of prior cholecystectomy. 4. Aortic atherosclerosis. Aortic Atherosclerosis (ICD10-I70.0). Electronically Signed   By: TVirgina NorfolkM.D.   On: 02/11/2021 17:23   MR BRAIN WO CONTRAST  Result Date: 02/01/2021 CLINICAL DATA:  Neuro deficit, acute, stroke suspected EXAM: MRI HEAD WITHOUT CONTRAST TECHNIQUE: Multiplanar, multiecho pulse sequences of the brain and surrounding structures were obtained without intravenous contrast. COMPARISON:  02/01/2021 and prior. FINDINGS: Brain: No diffusion-weighted signal abnormality. No intracranial hemorrhage. No midline shift, ventriculomegaly or extra-axial fluid collection. No mass lesion. Mild cerebral atrophy with ex vacuo dilatation. Moderate chronic microvascular ischemic changes. Chronic right cerebellar lacunar insult. Vascular: Proximally preserved major intracranial flow voids. Skull and upper cervical spine: Normal marrow signal. Sinuses/Orbits: Sequela of bilateral lens replacement. Globes are intact. Decreased left periorbital soft tissue swelling. Pneumatized paranasal sinuses. Small right mastoid effusion. Other: None. IMPRESSION: No acute intracranial process. Chronic right cerebellar lacunar insult. Mild cerebral atrophy. Moderate chronic microvascular ischemic changes. Electronically Signed   By: CPrimitivo GauzeM.D.   On: 02/01/2021 19:08   CT ABDOMEN PELVIS W CONTRAST  Result Date: 02/14/2021 CLINICAL DATA:  Diarrhea EXAM: CT ABDOMEN AND PELVIS WITH CONTRAST TECHNIQUE: Multidetector CT imaging of the abdomen and pelvis was performed using the standard protocol following bolus administration of intravenous contrast. CONTRAST:  1069mOMNIPAQUE IOHEXOL 300 MG/ML  SOLN COMPARISON:  12/13/2018 FINDINGS: Lower chest: No acute pleural or parenchymal lung disease. Hepatobiliary: No focal liver abnormality is seen. Status post cholecystectomy. No biliary  dilatation. Pancreas: Unremarkable. No pancreatic ductal dilatation or surrounding inflammatory changes. Spleen: Normal in size without focal abnormality. Adrenals/Urinary Tract: There is mild bilateral renal cortical atrophy. Otherwise the kidneys enhance normally and symmetrically. No urinary tract calculi or obstructive uropathy. The bladder is decompressed, limiting its evaluation. The adrenals are grossly normal. Stomach/Bowel: No bowel obstruction or ileus. Normal appendix right lower quadrant. Diffuse diverticulosis of the sigmoid colon without diverticulitis. Vascular/Lymphatic: Aortic atherosclerosis. No enlarged abdominal or pelvic lymph nodes. Reproductive: Marked uterine atrophy.  No adnexal masses. Other: No free fluid or free gas.  No abdominal wall hernia. Musculoskeletal: Stable avascular necrosis of the bilateral femoral heads. There are no acute bony  abnormalities. Reconstructed images demonstrate no additional findings. IMPRESSION: 1. Sigmoid diverticulosis without diverticulitis. 2. No acute intra-abdominal or intrapelvic process. 3.  Aortic Atherosclerosis (ICD10-I70.0). Electronically Signed   By: Randa Ngo M.D.   On: 02/14/2021 19:59   DG Chest Portable 1 View  Result Date: 02/11/2021 CLINICAL DATA:  Chest pain. EXAM: PORTABLE CHEST 1 VIEW COMPARISON:  Chest x-ray 05/09/2018. FINDINGS: Mediastinum and hilar structures normal. Heart size normal. No focal infiltrate. Low lung volumes with mild bibasilar atelectasis. No pleural effusion or pneumothorax. Surgical clips right upper quadrant. IMPRESSION: No acute cardiopulmonary disease. Low lung volumes with mild bibasilar atelectasis. Electronically Signed   By: Marcello Moores  Register   On: 02/11/2021 15:15   ECHOCARDIOGRAM COMPLETE  Result Date: 02/02/2021    ECHOCARDIOGRAM REPORT   Patient Name:   Christina Sellers Date of Exam: 02/02/2021 Medical Rec #:  [id number]         Height:       58.0 in Accession #:    [id number]        Weight:        135.0 lb Date of Birth:  [birth year]         BSA:          1.541 m Patient Age:    88 years          BP:           147/53 mmHg Patient Gender: F                 HR:           71 bpm. Exam Location:  Inpatient Procedure: 2D Echo, Cardiac Doppler and Color Doppler Indications:    R55 Syncope  History:        Patient has prior history of Echocardiogram examinations, most                 recent 06/23/2017. COPD and Carotid Disease; Risk                 Factors:Hypertension and Dyslipidemia. GERD. Thyroid Disease.  Sonographer:    Jonelle Sidle Dance Referring Phys: [id number] [place]  1. Left ventricular ejection fraction, by estimation, is 65 to 70%. The left ventricle has normal function. The left ventricle has no regional wall motion abnormalities. Left ventricular diastolic parameters are consistent with Grade I diastolic dysfunction (impaired relaxation).  2. Right ventricular systolic function is normal. The right ventricular size is mildly enlarged. There is normal pulmonary artery systolic pressure.  3. Left atrial size was mild to moderately dilated.  4. The mitral valve is normal in structure. Mild mitral valve regurgitation. No evidence of mitral stenosis. Moderate mitral annular calcification.  5. Tricuspid valve regurgitation is mild to moderate.  6. The aortic valve is normal in structure. Aortic valve regurgitation is not visualized. No aortic stenosis is present.  7. The inferior vena cava is normal in size with greater than 50% respiratory variability, suggesting right atrial pressure of 3 mmHg. FINDINGS  Left Ventricle: Left ventricular ejection fraction, by estimation, is 65 to 70%. The left ventricle has normal function. The left ventricle has no regional wall motion abnormalities. The left ventricular internal cavity size was normal in size. There is  no left ventricular hypertrophy. Left ventricular diastolic parameters are consistent with Grade I diastolic dysfunction (impaired  relaxation). Normal left ventricular filling pressure. Right Ventricle: The right ventricular size is mildly enlarged. No increase in right ventricular wall thickness. Right ventricular systolic function  is normal. There is normal pulmonary artery systolic pressure. The tricuspid regurgitant velocity is 2.46  m/s, and with an assumed right atrial pressure of 3 mmHg, the estimated right ventricular systolic pressure is 29.5 mmHg. Left Atrium: Left atrial size was mild to moderately dilated. Right Atrium: Right atrial size was normal in size. Pericardium: There is no evidence of pericardial effusion. Mitral Valve: The mitral valve is normal in structure. Moderate mitral annular calcification. Mild mitral valve regurgitation. No evidence of mitral valve stenosis. Tricuspid Valve: The tricuspid valve is normal in structure. Tricuspid valve regurgitation is mild to moderate. No evidence of tricuspid stenosis. Aortic Valve: The aortic valve is normal in structure. Aortic valve regurgitation is not visualized. No aortic stenosis is present. Pulmonic Valve: The pulmonic valve was normal in structure. Pulmonic valve regurgitation is not visualized. No evidence of pulmonic stenosis. Aorta: The aortic root is normal in size and structure. Venous: The inferior vena cava is normal in size with greater than 50% respiratory variability, suggesting right atrial pressure of 3 mmHg. IAS/Shunts: No atrial level shunt detected by color flow Doppler.  LEFT VENTRICLE PLAX 2D LVIDd:         4.20 cm  Diastology LVIDs:         2.70 cm  LV e' medial:    6.85 cm/s LV PW:         1.10 cm  LV E/e' medial:  11.6 LV IVS:        0.90 cm  LV e' lateral:   6.31 cm/s LVOT diam:     2.00 cm  LV E/e' lateral: 12.6 LV SV:         82 LV SV Index:   53 LVOT Area:     3.14 cm  RIGHT VENTRICLE             IVC RV Basal diam:  2.40 cm     IVC diam: 1.60 cm RV S prime:     11.60 cm/s TAPSE (M-mode): 2.4 cm LEFT ATRIUM             Index       RIGHT ATRIUM            Index LA diam:        4.80 cm 3.11 cm/m  RA Area:     20.70 cm LA Vol (A2C):   65.9 ml 42.77 ml/m RA Volume:   64.80 ml  42.05 ml/m LA Vol (A4C):   56.6 ml 36.73 ml/m LA Biplane Vol: 63.0 ml 40.88 ml/m  AORTIC VALVE LVOT Vmax:   110.00 cm/s LVOT Vmean:  71.900 cm/s LVOT VTI:    0.262 m  AORTA Ao Root diam: 2.60 cm Ao Asc diam:  2.50 cm MITRAL VALVE                TRICUSPID VALVE MV Area (PHT): 2.95 cm     TR Peak grad:   24.2 mmHg MV Decel Time: 257 msec     TR Vmax:        246.00 cm/s MV E velocity: 79.30 cm/s MV A velocity: 120.00 cm/s  SHUNTS MV E/A ratio:  0.66         Systemic VTI:  0.26 m                             Systemic Diam: 2.00 cm Ena Dawley MD Electronically signed by Ena Dawley MD Signature Date/Time: 02/02/2021/10:56:45 AM  Final    DG Hips Bilat W or Wo Pelvis 2 Views  Result Date: 02/01/2021 CLINICAL DATA:  Syncope EXAM: DG HIP (WITH OR WITHOUT PELVIS) 2V BILAT COMPARISON:  Apr 18, 2019 FINDINGS: Osteopenia. No acute fracture or dislocation. Degenerative changes of the lower lumbar spine. Hip joints are relatively preserved. No area of erosion or osseous destruction. No unexpected radiopaque foreign body. Vascular calcifications. IMPRESSION: No acute osseous abnormality. If persistent concern for nondisplaced hip or pelvic fracture, recommend dedicated pelvic MRI. Electronically Signed   By: Valentino Saxon MD   On: 02/01/2021 17:13   VAS US CAROTID  Result Date: 02/15/2021 Carotid Arterial Duplex Study Indications:       Bilateral bruits and Syncope. Risk Factors:      Hypertension, Diabetes. Comparison Study:  No prior study Performing Technologist: Sharion Dove RVS  Examination Guidelines: A complete evaluation includes B-mode imaging, spectral Doppler, color Doppler, and power Doppler as needed of all accessible portions of each vessel. Bilateral testing is considered an integral part of a complete examination. Limited examinations for reoccurring  indications may be performed as noted.  Right Carotid Findings: +----------+--------+--------+--------+------------------+------------------+           PSV cm/sEDV cm/sStenosisPlaque DescriptionComments           +----------+--------+--------+--------+------------------+------------------+ CCA Prox  89      17                                intimal thickening +----------+--------+--------+--------+------------------+------------------+ CCA Distal48      9                                 intimal thickening +----------+--------+--------+--------+------------------+------------------+ ICA Prox  262     55      40-59%  calcific          Shadowing          +----------+--------+--------+--------+------------------+------------------+ ICA Mid   273     56                                                   +----------+--------+--------+--------+------------------+------------------+ ICA Distal121     22                                                   +----------+--------+--------+--------+------------------+------------------+ ECA       66      6                                                    +----------+--------+--------+--------+------------------+------------------+ +----------+--------+-------+--------+-------------------+           PSV cm/sEDV cmsDescribeArm Pressure (mmHG) +----------+--------+-------+--------+-------------------+ Subclavian190                                        +----------+--------+-------+--------+-------------------+ +---------+--------+---+--------+--+ VertebralPSV cm/s121EDV cm/s19 +---------+--------+---+--------+--+  Left Carotid Findings: +----------+--------+--------+--------+------------------+------------------+  PSV cm/sEDV cm/sStenosisPlaque DescriptionComments           +----------+--------+--------+--------+------------------+------------------+ CCA Prox  121     12                                 intimal thickening +----------+--------+--------+--------+------------------+------------------+ CCA Distal79      14                                intimal thickening +----------+--------+--------+--------+------------------+------------------+ ICA Prox  132     29      1-39%   calcific                             +----------+--------+--------+--------+------------------+------------------+ ICA Mid   135     24                                                   +----------+--------+--------+--------+------------------+------------------+ ICA Distal138     26                                                   +----------+--------+--------+--------+------------------+------------------+ ECA       165     3                                                    +----------+--------+--------+--------+------------------+------------------+ +----------+--------+--------+--------+-------------------+           PSV cm/sEDV cm/sDescribeArm Pressure (mmHG) +----------+--------+--------+--------+-------------------+ [id number]                                         +----------+--------+--------+--------+-------------------+ +---------+--------+--+--------+-+---------------+ VertebralPSV cm/s40EDV cm/s7Bi- directional +---------+--------+--+--------+-+---------------+   Summary: Right Carotid: Velocities in the right ICA are consistent with a 40-59%                stenosis. Left Carotid: Velocities in the left ICA are consistent with a 1-39% stenosis. Vertebrals:  Right vertebral artery demonstrates antegrade flow. Left vertebral              artery demonstrates bidirectional flow. Subclavians: Normal flow hemodynamics were seen in bilateral subclavian              arteries. *See table(s) above for measurements and observations.  Electronically signed by Harold Barban MD on 02/15/2021 at 9:10:24 PM.    Final    VAS [place] LOWER EXTREMITY VENOUS (DVT)  Result Date:  02/15/2021  Lower Venous DVT Study Indications: Swelling.  Risk Factors: None identified. Anticoagulation: Lovenox. Comparison Study: LLE reflux done 2/21 Neg DVT Pos reflux Performing Technologist: Vonzell Schlatter RVT  Examination Guidelines: A complete evaluation includes B-mode imaging, spectral Doppler, color Doppler, and power Doppler as needed of all accessible portions of each vessel. Bilateral testing is considered an integral part of a complete examination. Limited examinations for reoccurring indications may be  performed as noted. The reflux portion of the exam is performed with the patient in reverse Trendelenburg.  +---------+---------------+---------+-----------+----------+--------------+ RIGHT    CompressibilityPhasicitySpontaneityPropertiesThrombus Aging +---------+---------------+---------+-----------+----------+--------------+ CFV      Full           Yes      Yes                                 +---------+---------------+---------+-----------+----------+--------------+ SFJ      Full                                                        +---------+---------------+---------+-----------+----------+--------------+ FV Prox  Full                                                        +---------+---------------+---------+-----------+----------+--------------+ FV Mid   Full                                                        +---------+---------------+---------+-----------+----------+--------------+ FV DistalFull                                                        +---------+---------------+---------+-----------+----------+--------------+ PFV      Full                                                        +---------+---------------+---------+-----------+----------+--------------+ POP      Full           Yes      Yes                                 +---------+---------------+---------+-----------+----------+--------------+ PTV      Full                                                         +---------+---------------+---------+-----------+----------+--------------+ PERO     Full                                                        +---------+---------------+---------+-----------+----------+--------------+   +---------+---------------+---------+-----------+----------+--------------+ LEFT     CompressibilityPhasicitySpontaneityPropertiesThrombus Aging +---------+---------------+---------+-----------+----------+--------------+ CFV      Full           Yes  Yes                                 +---------+---------------+---------+-----------+----------+--------------+ SFJ      Full                                                        +---------+---------------+---------+-----------+----------+--------------+ FV Prox  Full                                                        +---------+---------------+---------+-----------+----------+--------------+ FV Mid   Full                                                        +---------+---------------+---------+-----------+----------+--------------+ FV DistalFull                                                        +---------+---------------+---------+-----------+----------+--------------+ PFV      Full                                                        +---------+---------------+---------+-----------+----------+--------------+ POP      Full           Yes      Yes                                 +---------+---------------+---------+-----------+----------+--------------+ PTV      Full                                                        +---------+---------------+---------+-----------+----------+--------------+ PERO     Full                                                        +---------+---------------+---------+-----------+----------+--------------+   Summary: BILATERAL: - No evidence of deep vein thrombosis seen in the  lower extremities, bilaterally. -No evidence of popliteal cyst, bilaterally. RIGHT: - No cystic structure found in the popliteal fossa.  LEFT: - No cystic structure found in the popliteal fossa.  *See table(s) above for measurements and observations. Electronically signed by Harold Barban MD on 02/15/2021 at 9:07:46 PM.    Final    CT Maxillofacial WO CM  Result Date: 01/27/2021  CLINICAL DATA:  Syncopal episode with a fall from standing. EXAM: CT HEAD WITHOUT CONTRAST CT MAXILLOFACIAL WITHOUT CONTRAST TECHNIQUE: Multidetector CT imaging of the head and maxillofacial structures were performed using the standard protocol without intravenous contrast. Multiplanar CT image reconstructions of the maxillofacial structures were also generated. COMPARISON:  None. FINDINGS: CT HEAD FINDINGS Brain: No evidence of acute infarction, hemorrhage, hydrocephalus, extra-axial collection or mass lesion/mass effect. There is mild cerebral volume loss with associated ex vacuo dilatation. Periventricular white matter hypoattenuation likely represents chronic small vessel ischemic disease. Vascular: There are vascular calcifications in the carotid siphons. Skull: Normal. Negative for fracture or focal lesion. Other: There is a small right mastoid effusion. CT MAXILLOFACIAL FINDINGS Osseous: No fracture or mandibular dislocation. No destructive process. Orbits: Negative. No traumatic or inflammatory finding. Sinuses: There is left sphenoid sinus disease. Soft tissues: There is left face and periorbital soft tissue swelling. IMPRESSION: 1. No acute intracranial process. 2. No evidence of acute facial bone fracture. Electronically Signed   By: Zerita Boers M.D.   On: 01/27/2021 20:29     Subjective: She is feeling well. Had an episode yesterday of lightheaded, different that what brought her here. Episode resolved after she drank orange juice. Her CBG during episode was 70. No dizziness today. I will provide prescription for  glucose meter.   Discharge Exam: Vitals:   02/16/21 0841 02/16/21 1304  BP: (!) 136/49 (!) 166/59  Pulse: 70 78  Resp: 15 19  Temp: 97.8 F (36.6 C) 98 F (36.7 C)  SpO2: 98% 98%     General: Pt is alert, awake, not in acute distress Cardiovascular: RRR, S1/S2 +, no rubs, no gallops Respiratory: CTA bilaterally, no wheezing, no rhonchi Abdominal: Soft, NT, ND, bowel sounds + Extremities: no edema, no cyanosis    The results of significant diagnostics from this hospitalization (including imaging, microbiology, ancillary and laboratory) are listed below for reference.     Microbiology: Recent Results (from the past 240 hour(s))  SARS CORONAVIRUS 2 (TAT 6-24 HRS) Nasopharyngeal Nasopharyngeal Swab     Status: None   Collection Time: 02/11/21  1:27 PM   Specimen: Nasopharyngeal Swab  Result Value Ref Range Status   SARS Coronavirus 2 NEGATIVE NEGATIVE Final    Comment: (NOTE) SARS-CoV-2 target nucleic acids are NOT DETECTED.  The SARS-CoV-2 RNA is generally detectable in upper and lower respiratory specimens during the acute phase of infection. Negative results do not preclude SARS-CoV-2 infection, do not rule out co-infections with other pathogens, and should not be used as the sole basis for treatment or other patient management decisions. Negative results must be combined with clinical observations, patient history, and epidemiological information. The expected result is Negative.  Fact Sheet for Patients: SugarRoll.be  Fact Sheet for Healthcare Providers: https://www.woods-mathews.com/  This test is not yet approved or cleared by the [place] FDA and  has been authorized for detection and/or diagnosis of SARS-CoV-2 by FDA under an Emergency Use Authorization (EUA). This EUA will remain  in effect (meaning this test can be used) for the duration of the COVID-19 declaration under Se ction 564(b)(1) of the Act, 21  U.S.C. section 360bbb-3(b)(1), unless the authorization is terminated or revoked sooner.  Performed at [facility], [place] [address] [place], [place] [address]      Labs: BNP (last 3 results) Recent Labs    02/01/21 2200  BNP 474.2*   Basic Metabolic Panel: Recent Labs  Lab 02/11/21 1321 02/11/21 1447 02/12/21 0744 02/13/21 0700  02/14/21 0459 02/15/21 0357 02/16/21 0237  NA  --    < > 126* 131* 136 133* 136  K  --    < > 3.6 3.5 3.9 4.6 3.5  CL  --    < > 99 103 108 106 107  CO2  --    < > 21* 19* 21* 19* 23  GLUCOSE  --    < > 101* 87 99 81 90  BUN  --    < > _0 CREATININE  --    < > 0.86 0.72 0.74 0.69 0.74  CALCIUM  --    < > 8.9 8.7* 9.3 8.8* 8.6*  MG 1.9  --  1.7  --   --  1.9  --    < > = values in this interval not displayed.   Liver Function Tests: Recent Labs  Lab 02/11/21 1447  AST 24  ALT 17  ALKPHOS 44  BILITOT 1.0  PROT 6.6  ALBUMIN 4.0   No results for input(s): LIPASE, AMYLASE in the last 168 hours. No results for input(s): AMMONIA in the last 168 hours. CBC: Recent Labs  Lab 02/11/21 1321 02/12/21 0209 02/13/21 0700 02/15/21 0357  WBC 9.7 7.5 5.9 7.8  NEUTROABS 7.5  --   --   --   HGB 11.8* 10.5* 10.5* 10.7*  HCT 33.6* 30.1* 31.0* 30.9*  MCV 95.5 95.9 97.8 98.1  PLT 221 191 205 197   Cardiac Enzymes: No results for input(s): CKTOTAL, CKMB, CKMBINDEX, TROPONINI in the last 168 hours. BNP: Invalid input(s): POCBNP CBG: Recent Labs  Lab 02/15/21 1528 02/15/21 1601 02/15/21 2139 02/16/21 0745 02/16/21 1149  GLUCAP 71 109* 80 93 117*   D-Dimer No results for input(s): DDIMER in the last 72 hours. Hgb A1c No results for input(s): HGBA1C in the last 72 hours. Lipid Profile No results for input(s): CHOL, HDL, LDLCALC, TRIG, CHOLHDL, LDLDIRECT in the last 72 hours. Thyroid function studies No results for input(s): TSH, T4TOTAL, T3FREE, THYROIDAB in the last 72 hours.  Invalid input(s): FREET3 Anemia work  up Recent Labs    02/15/21 0357  VITAMINB12 749   Urinalysis    Component Value Date/Time   COLORURINE YELLOW 02/11/2021 2031   APPEARANCEUR HAZY (A) 02/11/2021 2031   LABSPEC 1.010 02/11/2021 2031   PHURINE 6.0 02/11/2021 2031   GLUCOSEU NEGATIVE 02/11/2021 2031   HGBUR NEGATIVE 02/11/2021 2031   BILIRUBINUR NEGATIVE 02/11/2021 2031   KETONESUR NEGATIVE 02/11/2021 2031   PROTEINUR NEGATIVE 02/11/2021 2031   UROBILINOGEN 0.2 01/22/2015 1100   NITRITE NEGATIVE 02/11/2021 2031   LEUKOCYTESUR NEGATIVE 02/11/2021 2031   Sepsis Labs Invalid input(s): PROCALCITONIN,  WBC,  LACTICIDVEN Microbiology Recent Results (from the past 240 hour(s))  SARS CORONAVIRUS 2 (TAT 6-24 HRS) Nasopharyngeal Nasopharyngeal Swab     Status: None   Collection Time: 02/11/21  1:27 PM   Specimen: Nasopharyngeal Swab  Result Value Ref Range Status   SARS Coronavirus 2 NEGATIVE NEGATIVE Final    Comment: (NOTE) SARS-CoV-2 target nucleic acids are NOT DETECTED.  The SARS-CoV-2 RNA is generally detectable in upper and lower respiratory specimens during the acute phase of infection. Negative results do not preclude SARS-CoV-2 infection, do not rule out co-infections with other pathogens, and should not be used as the sole basis for treatment or other patient management decisions. Negative results must be combined with clinical observations, patient history, and epidemiological information. The expected result is Negative.  Fact Sheet for  Patients: SugarRoll.be  Fact Sheet for Healthcare Providers: https://www.woods-mathews.com/  This test is not yet approved or cleared by the [place] FDA and  has been authorized for detection and/or diagnosis of SARS-CoV-2 by FDA under an Emergency Use Authorization (EUA). This EUA will remain  in effect (meaning this test can be used) for the duration of the COVID-19 declaration under Se ction 564(b)(1) of the Act, 21  U.S.C. section 360bbb-3(b)(1), unless the authorization is terminated or revoked sooner.  Performed at [facility], [place] [address] [place], [place] [address]      Time coordinating discharge: 40 minutes  SIGNED:   Elmarie Shiley, MD  [facility]

## 2021-02-18 DIAGNOSIS — E871 Hypo-osmolality and hyponatremia: Secondary | ICD-10-CM | POA: Diagnosis not present

## 2021-02-18 DIAGNOSIS — I110 Hypertensive heart disease with heart failure: Secondary | ICD-10-CM | POA: Diagnosis not present

## 2021-02-18 DIAGNOSIS — I5032 Chronic diastolic (congestive) heart failure: Secondary | ICD-10-CM | POA: Diagnosis not present

## 2021-02-18 DIAGNOSIS — R55 Syncope and collapse: Secondary | ICD-10-CM | POA: Diagnosis not present

## 2021-02-18 DIAGNOSIS — K529 Noninfective gastroenteritis and colitis, unspecified: Secondary | ICD-10-CM | POA: Diagnosis not present

## 2021-02-18 DIAGNOSIS — E039 Hypothyroidism, unspecified: Secondary | ICD-10-CM | POA: Diagnosis not present

## 2021-02-18 DIAGNOSIS — S51802D Unspecified open wound of left forearm, subsequent encounter: Secondary | ICD-10-CM | POA: Diagnosis not present

## 2021-02-18 DIAGNOSIS — I081 Rheumatic disorders of both mitral and tricuspid valves: Secondary | ICD-10-CM | POA: Diagnosis not present

## 2021-02-19 DIAGNOSIS — I081 Rheumatic disorders of both mitral and tricuspid valves: Secondary | ICD-10-CM | POA: Diagnosis not present

## 2021-02-19 DIAGNOSIS — I110 Hypertensive heart disease with heart failure: Secondary | ICD-10-CM | POA: Diagnosis not present

## 2021-02-19 DIAGNOSIS — I5032 Chronic diastolic (congestive) heart failure: Secondary | ICD-10-CM | POA: Diagnosis not present

## 2021-02-19 DIAGNOSIS — S51802D Unspecified open wound of left forearm, subsequent encounter: Secondary | ICD-10-CM | POA: Diagnosis not present

## 2021-02-19 DIAGNOSIS — K529 Noninfective gastroenteritis and colitis, unspecified: Secondary | ICD-10-CM | POA: Diagnosis not present

## 2021-02-20 DIAGNOSIS — S51802D Unspecified open wound of left forearm, subsequent encounter: Secondary | ICD-10-CM | POA: Diagnosis not present

## 2021-02-20 DIAGNOSIS — I110 Hypertensive heart disease with heart failure: Secondary | ICD-10-CM | POA: Diagnosis not present

## 2021-02-20 DIAGNOSIS — I081 Rheumatic disorders of both mitral and tricuspid valves: Secondary | ICD-10-CM | POA: Diagnosis not present

## 2021-02-20 DIAGNOSIS — I5032 Chronic diastolic (congestive) heart failure: Secondary | ICD-10-CM | POA: Diagnosis not present

## 2021-02-20 DIAGNOSIS — K529 Noninfective gastroenteritis and colitis, unspecified: Secondary | ICD-10-CM | POA: Diagnosis not present

## 2021-02-23 DIAGNOSIS — S51802D Unspecified open wound of left forearm, subsequent encounter: Secondary | ICD-10-CM | POA: Diagnosis not present

## 2021-02-23 DIAGNOSIS — I081 Rheumatic disorders of both mitral and tricuspid valves: Secondary | ICD-10-CM | POA: Diagnosis not present

## 2021-02-23 DIAGNOSIS — I110 Hypertensive heart disease with heart failure: Secondary | ICD-10-CM | POA: Diagnosis not present

## 2021-02-23 DIAGNOSIS — I5032 Chronic diastolic (congestive) heart failure: Secondary | ICD-10-CM | POA: Diagnosis not present

## 2021-02-23 DIAGNOSIS — K529 Noninfective gastroenteritis and colitis, unspecified: Secondary | ICD-10-CM | POA: Diagnosis not present

## 2021-02-24 DIAGNOSIS — I110 Hypertensive heart disease with heart failure: Secondary | ICD-10-CM | POA: Diagnosis not present

## 2021-02-24 DIAGNOSIS — K529 Noninfective gastroenteritis and colitis, unspecified: Secondary | ICD-10-CM | POA: Diagnosis not present

## 2021-02-24 DIAGNOSIS — I081 Rheumatic disorders of both mitral and tricuspid valves: Secondary | ICD-10-CM | POA: Diagnosis not present

## 2021-02-24 DIAGNOSIS — S51802D Unspecified open wound of left forearm, subsequent encounter: Secondary | ICD-10-CM | POA: Diagnosis not present

## 2021-02-24 DIAGNOSIS — I5032 Chronic diastolic (congestive) heart failure: Secondary | ICD-10-CM | POA: Diagnosis not present

## 2021-02-26 DIAGNOSIS — I110 Hypertensive heart disease with heart failure: Secondary | ICD-10-CM | POA: Diagnosis not present

## 2021-02-26 DIAGNOSIS — I081 Rheumatic disorders of both mitral and tricuspid valves: Secondary | ICD-10-CM | POA: Diagnosis not present

## 2021-02-26 DIAGNOSIS — I5032 Chronic diastolic (congestive) heart failure: Secondary | ICD-10-CM | POA: Diagnosis not present

## 2021-02-26 DIAGNOSIS — K529 Noninfective gastroenteritis and colitis, unspecified: Secondary | ICD-10-CM | POA: Diagnosis not present

## 2021-02-26 DIAGNOSIS — S51802D Unspecified open wound of left forearm, subsequent encounter: Secondary | ICD-10-CM | POA: Diagnosis not present

## 2021-02-27 ENCOUNTER — Ambulatory Visit: Payer: Medicare Other | Admitting: Internal Medicine

## 2021-02-27 ENCOUNTER — Encounter: Payer: Self-pay | Admitting: Internal Medicine

## 2021-02-27 ENCOUNTER — Other Ambulatory Visit: Payer: Self-pay

## 2021-02-27 VITALS — BP 163/62 | HR 80 | Ht 59.0 in | Wt 130.0 lb

## 2021-02-27 DIAGNOSIS — I700 Atherosclerosis of aorta: Secondary | ICD-10-CM

## 2021-02-27 DIAGNOSIS — E785 Hyperlipidemia, unspecified: Secondary | ICD-10-CM | POA: Diagnosis not present

## 2021-02-27 DIAGNOSIS — E1169 Type 2 diabetes mellitus with other specified complication: Secondary | ICD-10-CM

## 2021-02-27 DIAGNOSIS — I951 Orthostatic hypotension: Secondary | ICD-10-CM | POA: Diagnosis not present

## 2021-02-27 NOTE — Progress Notes (Signed)
Cardiology Office Note:    Date:  02/27/2021   ID:  Christina Sellers, DOB 1941-04-24, MRN [id number]  PCP:  Josetta Huddle, MD  Provider Notes:  Hearing Impaired   [place]  Cardiologist:  Werner Lean, MD  Advanced Practice Provider:  No care team member to display Electrophysiologist:  None      CC: Follow up syncope  History of Present Illness:    Christina Sellers is a 80 y.o. female with a hx of PAD (CAS), hx of COPD with Asthma, HLD with T2DM with aortic atherosclerosis, HTN with DM who presents for evaluation 01/21/21. In interim of this visit, patient had syncope issues including syncope during an office visit.  Seen in the hospital during that admission and her diuretics and BB were stopped due to intolerance.  Seen 02/27/21  Patient notes that she is doing having a lot of hip pain.  Since last visit notes that she is recovering from the hospitalization.  Elevated BP with pain.  No chest pain or pressure .  No SOB/DOE and no PND/Orthopnea.  No weight gain or leg swelling.  No palpitations or syncope.  No lightheadedness; able to eat snacks and three meals a day.  Is more activity and is back to making meals and doing ADLs for her self.  Wants to get back to yoga and chair.  Ambulatory blood pressure 142/70 on average (log reviewed).  Past Medical History:  Diagnosis Date  . Abnormal Pap smear of vagina   . Allergic sinusitis   . Anxiety   . Atherosclerotic peripheral vascular disease ([place])   . Carotid artery stenosis   . Cellulitis   . COPD with asthma ([place])    not an issue now  . Depression   . Diabetes mellitus type 2, uncomplicated ([place])   . DJD (degenerative joint disease)   . Elevated cholesterol   . Elevated MCV    secondary to alchol  . GERD (gastroesophageal reflux disease)    not an issue now.  . Gout   . Hearing difficulty    bilateral hearing aids  . History of recurrent UTIs   . Hypertension    loss weight'off  meds now"  . Hypothyroidism   . IBS (irritable bowel syndrome)   . Insomnia   . Labial fusion   . Osteoarthritis of left knee   . Osteopenia   . Recurrent vaginitis   . Sjogren's syndrome ([place])   . Syncope 11.7.14   secondary to dehydration and possible hypoglycemia  . Tendonitis   . Thyroid disease   . Transfusion history    age 76 "anemia"    Past Surgical History:  Procedure Laterality Date  . CATARACT EXTRACTION, BILATERAL Bilateral   . CHOLECYSTECTOMY     open  . COLONOSCOPY WITH PROPOFOL N/A 03/23/2016   Procedure: COLONOSCOPY WITH PROPOFOL;  Surgeon: Garlan Fair, MD;  Location: [facility];  Service: Endoscopy;  Laterality: N/A;  . EYE SURGERY    . FOOT SURGERY Left    retained hardware  . KNEE SURGERY Left    scope   . TONSILLECTOMY      Current Medications: Current Meds  Medication Sig  . acetaminophen (TYLENOL) 325 MG tablet Take 2 tablets (650 mg total) by mouth every 4 (four) hours as needed for headache or mild pain.  Marland Kitchen ascorbic acid (VITAMIN C) 500 MG tablet Take 500 mg by mouth daily.  . blood glucose meter kit and supplies KIT Dispense based  on patient and insurance preference. Use up to four times daily as directed.  . Cholecalciferol (VITAMIN D3) 50 MCG (2000 UT) capsule Take 2,000 mg by mouth daily.  . Cyanocobalamin (VITAMIN B-12) 1000 MCG SUBL Place 1,000 mcg under the tongue daily.  Marland Kitchen denosumab (PROLIA) 60 MG/ML SOSY injection Inject 60 mg into the skin every 6 (six) months.  . diclofenac sodium (VOLTAREN) 1 % GEL Apply 1 application topically 2 (two) times daily as needed (to painful sites).  . feeding supplement (ENSURE ENLIVE / ENSURE PLUS) LIQD Take 237 mLs by mouth 2 (two) times daily between meals.  . finasteride (PROSCAR) 5 MG tablet Take 2.5 mg by mouth daily.  . fluticasone (FLONASE) 50 MCG/ACT nasal spray Place 1 spray into both nostrils daily as needed (for seasonal allergies).  . hypromellose (SYSTANE OVERNIGHT THERAPY) 0.3 % GEL  ophthalmic ointment Place 1 application into both eyes at bedtime as needed for dry eyes.  Marland Kitchen levothyroxine (SYNTHROID) 25 MCG tablet Take 1-4 tablets (25-100 mcg total) by mouth See admin instructions. Take 25 mcg by mouth in the morning before breakfast on Sun/Tues/Thurs/Sat and 100 mcg on Mon/Wed/Fri  . levothyroxine (SYNTHROID) 50 MCG tablet Take 50 mcg by mouth 2 (two) times a week.  . Menthol, Topical Analgesic, (BIOFREEZE EX) Apply 1 application topically 2 (two) times daily as needed (for knee pain).  . methocarbamol (ROBAXIN) 500 MG tablet Take 250 mg by mouth 3 (three) times daily as needed for muscle spasms (or back pain).  . Methylcobalamin 1000 MCG TBDP Take 1,000 mcg by mouth daily.  . ondansetron (ZOFRAN ODT) 4 MG disintegrating tablet Take 1 tablet (4 mg total) by mouth every 8 (eight) hours as needed for nausea or vomiting. (Patient taking differently: Take 4 mg by mouth every 8 (eight) hours as needed for nausea or vomiting (dissolve orally).)  . polyethylene glycol (MIRALAX / GLYCOLAX) 17 g packet as needed.  . potassium chloride SA (KLOR-CON) 20 MEQ tablet Take 1 tablet (20 mEq total) by mouth daily.  . Psyllium (METAMUCIL PO) Take 1 Scoop by mouth at bedtime as needed (constipation- mix and drink as directed).  . RESTASIS 0.05 % ophthalmic emulsion Place 1 drop into both eyes 2 (two) times daily.  . rosuvastatin (CRESTOR) 5 MG tablet Take 5 mg by mouth daily.  . sucralfate (CARAFATE) 1 g tablet Take 1 tablet (1 g total) by mouth 4 (four) times daily -  with meals and at bedtime for 7 days.  . vitamin E 200 UNIT capsule daily.     Allergies:   Tape, Celexa [citalopram hydrobromide], Fluconazole, Green tea leaf ext, Kenalog [triamcinolone], Levaquin [levofloxacin], Losartan potassium-hctz, Methocarbamol, Nitrofurantoin macrocrystal, Scopolamine, Sulfa antibiotics, and Trimethoprim   Social History   Socioeconomic History  . Marital status: Widowed    Spouse name: Not on file   . Number of children: Not on file  . Years of education: Not on file  . Highest education level: Not on file  Occupational History  . Not on file  Tobacco Use  . Smoking status: Former Smoker    Quit date: 02/21/1982    Years since quitting: 39.0  . Smokeless tobacco: Never Used  Vaping Use  . Vaping Use: Never used  Substance and Sexual Activity  . Alcohol use: No  . Drug use: No  . Sexual activity: Never    Birth control/protection: Post-menopausal  Other Topics Concern  . Not on file  Social History Narrative  . Not on file  Social Determinants of Health   Financial Resource Strain: Not on file  Food Insecurity: Not on file  Transportation Needs: Not on file  Physical Activity: Not on file  Stress: Not on file  Social Connections: Not on file    Social:  Daughter is in [place] and had two kids' she has multiple family members one who graduated dental school   Family History: The patient's family history includes Breast cancer in her sister; Cancer in her brother and father; Diabetes in her maternal aunt.  ROS:   Please see the history of present illness.     All other systems reviewed and are negative.  EKGs/Labs/Other Studies Reviewed:    The following studies were reviewed today:  EKG:   01/21/21: SR 65 WNL  Transthoracic Echocardiogram: Date: 06/23/2017 Results: Study Conclusions  1. Left ventricular ejection fraction, by estimation, is 65 to 70%. The  left ventricle has normal function. The left ventricle has no regional  wall motion abnormalities. Left ventricular diastolic parameters are  consistent with Grade I diastolic  dysfunction (impaired relaxation).  2. Right ventricular systolic function is normal. The right ventricular  size is mildly enlarged. There is normal pulmonary artery systolic  pressure.  3. Left atrial size was mild to moderately dilated.  4. The mitral valve is normal in structure. Mild mitral valve  regurgitation. No  evidence of mitral stenosis. Moderate mitral annular  calcification.  5. Tricuspid valve regurgitation is mild to moderate.  6. The aortic valve is normal in structure. Aortic valve regurgitation is  not visualized. No aortic stenosis is present.  7. The inferior vena cava is normal in size with greater than 50%  respiratory variability, suggesting right atrial pressure of 3 mmHg.  NM Stress Testing : Date: 04/14/2018 Results:  Nuclear stress EF: 77%.  The left ventricular ejection fraction is hyperdynamic (>65%).  There was no ST segment deviation noted during stress.  The study is normal.  This is a low risk study.   Normal pharmacologic nuclear stress test with no evidence for prior infarct or ischemia.  Hyperdynamic LVEF.  Recent Labs: 01/21/2021: NT-Pro BNP 551 02/01/2021: B Natriuretic Peptide 139.0 02/11/2021: ALT 17; TSH 1.151 02/15/2021: Hemoglobin 10.7; Magnesium 1.9; Platelets 197 02/16/2021: BUN 13; Creatinine, Ser 0.74; Potassium 3.5; Sodium 136  Recent Lipid Panel    Component Value Date/Time   CHOL 149 08/26/2011 0400   TRIG 195 (H) 08/26/2011 0400   HDL 37 (L) 08/26/2011 0400   CHOLHDL 4.0 08/26/2011 0400   VLDL 39 08/26/2011 0400   LDLCALC 73 08/26/2011 0400    Risk Assessment/Calculations:     N/A  Physical Exam:    VS:  BP (!) 163/62   Pulse 80   Ht 4' 11"  (1.499 m)   Wt 130 lb (59 kg)   SpO2 97%   BMI 26.26 kg/m     Orthostatic Vitals: Supine:  BP 174/74  HR 74 Sitting:  BP 172/71  HR 76 Standing:  BP 168/69  HR 83 Prolonged Stand:  BP 158/75  HR 76  Post Tylenol   Wt Readings from Last 3 Encounters:  02/27/21 130 lb (59 kg)  02/16/21 131 lb 6.4 oz (59.6 kg)  02/11/21 125 lb (56.7 kg)    GEN:  Well nourished, well developed in no acute distress HEENT: Normal NECK: No JVD; No carotid bruits LYMPHATICS: No lymphadenopathy CARDIAC: RRR, no murmurs, rubs, gallops RESPIRATORY:  Clear to auscultation without rales, wheezing or  rhonchi  ABDOMEN: Soft, non-tender,  non-distended MUSCULOSKELETAL:  +2 edema bilaterally; No deformity  SKIN: Warm and dry NEUROLOGIC:  Alert and oriented x 3 PSYCHIATRIC:  Normal affect   ASSESSMENT:    1. Hyperlipidemia associated with type 2 diabetes mellitus ([place])   2. Orthostatic hypotension   3. Aortic atherosclerosis ([facility])    PLAN:    In order of problems listed above:  Diabetes with hypertension With Orthostatic Hypotension With LE Edema - ambulatory blood pressure at goal for her, will continue ambulatory BP monitoring; gave education on how to perform ambulatory blood pressure monitoring including the frequency and technique - given her labile BP we will tolerate higher BP readings -if needs short term medication perioperatively (with appropriate pain control) would use hydralazine or other short acting agent  Preoperative Risk Assessment - The Revised Cardiac Risk Index = 0 (DM with no insulin) which equates to 0=0.4% estimated risk of perioperative myocardial infarction, pulmonary edema, ventricular fibrillation, cardiac arrest, or complete heart block.  - No further cardiac testing is recommended prior to surgery.    (HLD with DM) Aortic atherosclerosis -LDL goal less than 70 - will check Lipids and LFT post surgery and will likely offer an increase in her statin - gave education on dietary changes (discussed plant based diets)  Post surgery (in the fall) follow up unless new symptoms or abnormal test results warranting change in plan  Would be reasonable for  APP Follow up  Time Spent Directly with Patient:   I have spent a total of 40 minutes with the patient reviewing notes, imaging, EKGs, labs and examining the patient as well as establishing an assessment and plan that was discussed personally with the patient.  > 50% of time was spent in direct patient care and family- discussing BP management and plant based diets.    Medication Adjustments/Labs and  Tests Ordered: Current medicines are reviewed at length with the patient today.  Concerns regarding medicines are outlined above.  Orders Placed This Encounter  Procedures  . Lipid panel   No orders of the defined types were placed in this encounter.   Patient Instructions  Medication Instructions:  Your physician recommends that you continue on your current medications as directed. Please refer to the Current Medication list given to you today.  *If you need a refill on your cardiac medications before your next appointment, please call your pharmacy*   Lab Work: NEXT APPOINTMENT: Fasting Lipid Panel If you have labs (blood work) drawn today and your tests are completely normal, you will receive your results only by: Marland Kitchen MyChart Message (if you have MyChart) OR . A paper copy in the mail If you have any lab test that is abnormal or we need to change your treatment, we will call you to review the results.   Testing/Procedures: NONE   Follow-Up: At [facility], you and your health needs are our priority.  As part of our continuing mission to provide you with exceptional heart care, we have created designated Provider Care Teams.  These Care Teams include your primary Cardiologist (physician) and Advanced Practice Providers (APPs -  Physician Assistants and Nurse Practitioners) who all work together to provide you with the care you need, when you need it.  We recommend signing up for the patient portal called "MyChart".  Sign up information is provided on this After Visit Summary.  MyChart is used to connect with patients for Virtual Visits (Telemedicine).  Patients are able to view lab/test results, encounter notes, upcoming appointments, etc.  Non-urgent messages can be sent to your provider as well.   To learn more about what you can do with MyChart, go to [email or web address].    Your next appointment:   7-8 month(s)  The format for your next appointment:   In  Person  Provider:   You may see Werner Lean, MD or one of the following Advanced Practice Providers on your designated Care Team:    Melina Copa, PA-C  Ermalinda Barrios, PA-C          Signed, Werner Lean, MD  02/27/2021 10:12 AM    [place]

## 2021-02-27 NOTE — Patient Instructions (Signed)
Medication Instructions:  Your physician recommends that you continue on your current medications as directed. Please refer to the Current Medication list given to you today.  *If you need a refill on your cardiac medications before your next appointment, please call your pharmacy*   Lab Work: NEXT APPOINTMENT: Fasting Lipid Panel If you have labs (blood work) drawn today and your tests are completely normal, you will receive your results only by: Marland Kitchen MyChart Message (if you have MyChart) OR . A paper copy in the mail If you have any lab test that is abnormal or we need to change your treatment, we will call you to review the results.   Testing/Procedures: NONE   Follow-Up: At [facility], you and your health needs are our priority.  As part of our continuing mission to provide you with exceptional heart care, we have created designated Provider Care Teams.  These Care Teams include your primary Cardiologist (physician) and Advanced Practice Providers (APPs -  Physician Assistants and Nurse Practitioners) who all work together to provide you with the care you need, when you need it.  We recommend signing up for the patient portal called "MyChart".  Sign up information is provided on this After Visit Summary.  MyChart is used to connect with patients for Virtual Visits (Telemedicine).  Patients are able to view lab/test results, encounter notes, upcoming appointments, etc.  Non-urgent messages can be sent to your provider as well.   To learn more about what you can do with MyChart, go to [email or web address].    Your next appointment:   7-8 month(s)  The format for your next appointment:   In Person  Provider:   You may see Werner Lean, MD or one of the following Advanced Practice Providers on your designated Care Team:    Melina Copa, PA-C  Ermalinda Barrios, PA-C

## 2021-03-02 DIAGNOSIS — S51802D Unspecified open wound of left forearm, subsequent encounter: Secondary | ICD-10-CM | POA: Diagnosis not present

## 2021-03-02 DIAGNOSIS — K529 Noninfective gastroenteritis and colitis, unspecified: Secondary | ICD-10-CM | POA: Diagnosis not present

## 2021-03-02 DIAGNOSIS — I5032 Chronic diastolic (congestive) heart failure: Secondary | ICD-10-CM | POA: Diagnosis not present

## 2021-03-02 DIAGNOSIS — I110 Hypertensive heart disease with heart failure: Secondary | ICD-10-CM | POA: Diagnosis not present

## 2021-03-02 DIAGNOSIS — I081 Rheumatic disorders of both mitral and tricuspid valves: Secondary | ICD-10-CM | POA: Diagnosis not present

## 2021-03-03 DIAGNOSIS — M1611 Unilateral primary osteoarthritis, right hip: Secondary | ICD-10-CM | POA: Diagnosis not present

## 2021-03-04 ENCOUNTER — Other Ambulatory Visit: Payer: Self-pay | Admitting: Orthopedic Surgery

## 2021-03-04 DIAGNOSIS — I5032 Chronic diastolic (congestive) heart failure: Secondary | ICD-10-CM | POA: Diagnosis not present

## 2021-03-04 DIAGNOSIS — D225 Melanocytic nevi of trunk: Secondary | ICD-10-CM | POA: Diagnosis not present

## 2021-03-04 DIAGNOSIS — Z01818 Encounter for other preprocedural examination: Secondary | ICD-10-CM

## 2021-03-04 DIAGNOSIS — L570 Actinic keratosis: Secondary | ICD-10-CM | POA: Diagnosis not present

## 2021-03-04 DIAGNOSIS — S51802D Unspecified open wound of left forearm, subsequent encounter: Secondary | ICD-10-CM | POA: Diagnosis not present

## 2021-03-04 DIAGNOSIS — I081 Rheumatic disorders of both mitral and tricuspid valves: Secondary | ICD-10-CM | POA: Diagnosis not present

## 2021-03-04 DIAGNOSIS — D692 Other nonthrombocytopenic purpura: Secondary | ICD-10-CM | POA: Diagnosis not present

## 2021-03-04 DIAGNOSIS — I110 Hypertensive heart disease with heart failure: Secondary | ICD-10-CM | POA: Diagnosis not present

## 2021-03-04 DIAGNOSIS — K529 Noninfective gastroenteritis and colitis, unspecified: Secondary | ICD-10-CM | POA: Diagnosis not present

## 2021-03-04 DIAGNOSIS — L821 Other seborrheic keratosis: Secondary | ICD-10-CM | POA: Diagnosis not present

## 2021-03-05 NOTE — Telephone Encounter (Signed)
I will re-fax clearance notes to Dr. Frederik Pear. Fax number [phone].

## 2021-03-05 NOTE — Telephone Encounter (Signed)
Patient has been seen and reevaluated by cardiology since hospital admission.  She does not need any further cardiac testing at this time.  Please resend her cardiac clearance to requesting office.  Thank you.

## 2021-03-05 NOTE — Telephone Encounter (Signed)
Wells Guiles with [employer] is following up. She states the patient's right hip replacement procedure has been rescheduled for 03/30/21. She would like to ensure that the clearance we provided is still valid. Please advise.  Phone #: [phone]

## 2021-03-06 DIAGNOSIS — I110 Hypertensive heart disease with heart failure: Secondary | ICD-10-CM | POA: Diagnosis not present

## 2021-03-06 DIAGNOSIS — K529 Noninfective gastroenteritis and colitis, unspecified: Secondary | ICD-10-CM | POA: Diagnosis not present

## 2021-03-06 DIAGNOSIS — I5032 Chronic diastolic (congestive) heart failure: Secondary | ICD-10-CM | POA: Diagnosis not present

## 2021-03-06 DIAGNOSIS — I081 Rheumatic disorders of both mitral and tricuspid valves: Secondary | ICD-10-CM | POA: Diagnosis not present

## 2021-03-06 DIAGNOSIS — S51802D Unspecified open wound of left forearm, subsequent encounter: Secondary | ICD-10-CM | POA: Diagnosis not present

## 2021-03-11 DIAGNOSIS — S51802D Unspecified open wound of left forearm, subsequent encounter: Secondary | ICD-10-CM | POA: Diagnosis not present

## 2021-03-11 DIAGNOSIS — K529 Noninfective gastroenteritis and colitis, unspecified: Secondary | ICD-10-CM | POA: Diagnosis not present

## 2021-03-11 DIAGNOSIS — I081 Rheumatic disorders of both mitral and tricuspid valves: Secondary | ICD-10-CM | POA: Diagnosis not present

## 2021-03-11 DIAGNOSIS — I5032 Chronic diastolic (congestive) heart failure: Secondary | ICD-10-CM | POA: Diagnosis not present

## 2021-03-11 DIAGNOSIS — I110 Hypertensive heart disease with heart failure: Secondary | ICD-10-CM | POA: Diagnosis not present

## 2021-03-17 DIAGNOSIS — Z1231 Encounter for screening mammogram for malignant neoplasm of breast: Secondary | ICD-10-CM | POA: Diagnosis not present

## 2021-03-17 DIAGNOSIS — Z803 Family history of malignant neoplasm of breast: Secondary | ICD-10-CM | POA: Diagnosis not present

## 2021-03-17 NOTE — Progress Notes (Signed)
DUE TO COVID-19 ONLY ONE VISITOR IS ALLOWED TO COME WITH YOU AND STAY IN THE WAITING ROOM ONLY DURING PRE OP AND PROCEDURE DAY OF SURGERY. THE 1 VISITOR  MAY VISIT WITH YOU AFTER SURGERY IN YOUR PRIVATE ROOM DURING VISITING HOURS ONLY!  YOU NEED TO HAVE A COVID 19 TEST ON____4/21/22___ @_______ , THIS TEST MUST BE DONE BEFORE SURGERY,  COVID TESTING SITE [address] [place] [id number], IT IS ON THE RIGHT GOING OUT [address] APPROXIMATELY  2 MINUTES PAST [employer] ON THE RIGHT. ONCE YOUR COVID TEST IS COMPLETED,  PLEASE BEGIN THE QUARANTINE INSTRUCTIONS AS OUTLINED IN YOUR HANDOUT.                Christina Sellers  03/17/2021   Your procedure is scheduled on:                   03/30/21  Report to [facility] Main  Entrance   Report to admitting at    [facility] AM     Call this number if you have problems the morning of surgery [phone]    REMEMBER: NO  SOLID FOOD CANDY OR GUM AFTER MIDNIGHT. CLEAR LIQUIDS UNTIL1015am           . NOTHING BY MOUTH EXCEPT CLEAR LIQUIDS UNTIL    1015 am     . PLEASE FINISH ENSURE DRINK PER SURGEON ORDER  WHICH NEEDS TO BE COMPLETED AT  1015am     .      CLEAR LIQUID DIET   Foods Allowed                                                                    Coffee and tea, regular and decaf                            Fruit ices (not with fruit pulp)                                      Iced Popsicles                                    Carbonated beverages, regular and diet                                    Cranberry, grape and apple juices Sports drinks like Gatorade Lightly seasoned clear broth or consume(fat free) Sugar, honey syrup ___________________________________________________________________      BRUSH YOUR TEETH MORNING OF SURGERY AND RINSE YOUR MOUTH OUT, NO CHEWING GUM CANDY OR MINTS.     Take these medicines the morning of surgery with A SIP OF WATER:     Proscar, synthroid  DO NOT TAKE ANY DIABETIC  MEDICATIONS DAY OF YOUR SURGERY                               You may not have any  metal on your body including hair pins and              piercings  Do not wear jewelry, make-up, lotions, powders or perfumes, deodorant             Do not wear nail polish on your fingernails.  Do not shave  48 hours prior to surgery.              Men may shave face and neck.   Do not bring valuables to the hospital. [place].  Contacts, dentures or bridgework may not be worn into surgery.  Leave suitcase in the car. After surgery it may be brought to your room.     Patients discharged the day of surgery will not be allowed to drive home. IF YOU ARE HAVING SURGERY AND GOING HOME THE SAME DAY, YOU MUST HAVE AN ADULT TO DRIVE YOU HOME AND BE WITH YOU FOR 24 HOURS. YOU MAY GO HOME BY TAXI OR UBER OR ORTHERWISE, BUT AN ADULT MUST ACCOMPANY YOU HOME AND STAY WITH YOU FOR 24 HOURS.  Name and phone number of your driver:  Special Instructions: N/A              Please read over the following fact sheets you were given: _____________________________________________________________________  [facility] - Preparing for Surgery Before surgery, you can play an important role.  Because skin is not sterile, your skin needs to be as free of germs as possible.  You can reduce the number of germs on your skin by washing with CHG (chlorahexidine gluconate) soap before surgery.  CHG is an antiseptic cleaner which kills germs and bonds with the skin to continue killing germs even after washing. Please DO NOT use if you have an allergy to CHG or antibacterial soaps.  If your skin becomes reddened/irritated stop using the CHG and inform your nurse when you arrive at Short Stay. Do not shave (including legs and underarms) for at least 48 hours prior to the first CHG shower.  You may shave your face/neck. Please follow these instructions carefully:  1.  Shower with CHG Soap the night  before surgery and the  morning of Surgery.  2.  If you choose to wash your hair, wash your hair first as usual with your  normal  shampoo.  3.  After you shampoo, rinse your hair and body thoroughly to remove the  shampoo.                           4.  Use CHG as you would any other liquid soap.  You can apply chg directly  to the skin and wash                       Gently with a scrungie or clean washcloth.  5.  Apply the CHG Soap to your body ONLY FROM THE NECK DOWN.   Do not use on face/ open                           Wound or open sores. Avoid contact with eyes, ears mouth and genitals (private parts).                       Wash face,  Genitals (private parts) with your normal soap.             6.  Wash thoroughly, paying special attention to the area where your surgery  will be performed.  7.  Thoroughly rinse your body with warm water from the neck down.  8.  DO NOT shower/wash with your normal soap after using and rinsing off  the CHG Soap.                9.  Pat yourself dry with a clean towel.            10.  Wear clean pajamas.            11.  Place clean sheets on your bed the night of your first shower and do not  sleep with pets. Day of Surgery : Do not apply any lotions/deodorants the morning of surgery.  Please wear clean clothes to the hospital/surgery center.  FAILURE TO FOLLOW THESE INSTRUCTIONS MAY RESULT IN THE CANCELLATION OF YOUR SURGERY PATIENT SIGNATURE_________________________________  NURSE SIGNATURE__________________________________  ________________________________________________________________________

## 2021-03-18 DIAGNOSIS — M25551 Pain in right hip: Secondary | ICD-10-CM | POA: Diagnosis not present

## 2021-03-18 DIAGNOSIS — R55 Syncope and collapse: Secondary | ICD-10-CM | POA: Diagnosis not present

## 2021-03-18 DIAGNOSIS — E871 Hypo-osmolality and hyponatremia: Secondary | ICD-10-CM | POA: Diagnosis not present

## 2021-03-18 DIAGNOSIS — M810 Age-related osteoporosis without current pathological fracture: Secondary | ICD-10-CM | POA: Diagnosis not present

## 2021-03-18 DIAGNOSIS — E039 Hypothyroidism, unspecified: Secondary | ICD-10-CM | POA: Diagnosis not present

## 2021-03-18 DIAGNOSIS — L659 Nonscarring hair loss, unspecified: Secondary | ICD-10-CM | POA: Diagnosis not present

## 2021-03-18 DIAGNOSIS — R600 Localized edema: Secondary | ICD-10-CM | POA: Diagnosis not present

## 2021-03-23 ENCOUNTER — Other Ambulatory Visit: Payer: Self-pay

## 2021-03-23 ENCOUNTER — Encounter (HOSPITAL_COMMUNITY): Payer: Self-pay

## 2021-03-23 ENCOUNTER — Encounter (HOSPITAL_COMMUNITY)
Admission: RE | Admit: 2021-03-23 | Discharge: 2021-03-23 | Disposition: A | Discharge status: Home / Self Care | Payer: Medicare Other | Source: Ambulatory Visit | Attending: Orthopedic Surgery | Admitting: Orthopedic Surgery

## 2021-03-23 DIAGNOSIS — Z01812 Encounter for preprocedural laboratory examination: Secondary | ICD-10-CM | POA: Diagnosis not present

## 2021-03-23 LAB — BASIC METABOLIC PANEL
Anion gap: 9 (ref 5–15)
BUN: 30 mg/dL — ABNORMAL HIGH (ref 8–23)
CO2: 22 mmol/L (ref 22–32)
Calcium: 9.9 mg/dL (ref 8.9–10.3)
Chloride: 109 mmol/L (ref 98–111)
Creatinine, Ser: 0.86 mg/dL (ref 0.44–1.00)
GFR, Estimated: >60 mL/min (ref >60)
Glucose, Bld: 105 mg/dL — ABNORMAL HIGH (ref 70–99)
Potassium: 4.5 mmol/L (ref 3.5–5.1)
Sodium: 140 mmol/L (ref 135–145)

## 2021-03-23 LAB — URINALYSIS, ROUTINE W REFLEX MICROSCOPIC
Bilirubin Urine: NEGATIVE
Glucose, UA: NEGATIVE mg/dL
Hgb urine dipstick: NEGATIVE
Ketones, ur: NEGATIVE mg/dL
Nitrite: NEGATIVE
Protein, ur: 30 mg/dL — AB
Specific Gravity, Urine: 1.014 (ref 1.005–1.030)
pH: 5 (ref 5.0–8.0)

## 2021-03-23 LAB — SURGICAL PCR SCREEN
MRSA, PCR: NEGATIVE
Staphylococcus aureus: NEGATIVE

## 2021-03-23 LAB — CBC WITH DIFFERENTIAL/PLATELET
Abs Immature Granulocytes: 0.03 10*3/uL (ref 0.00–0.07)
Basophils Absolute: 0 10*3/uL (ref 0.0–0.1)
Basophils Relative: 1 %
Eosinophils Absolute: 0 10*3/uL (ref 0.0–0.5)
Eosinophils Relative: 1 %
HCT: 36 % (ref 36.0–46.0)
Hemoglobin: 11.5 g/dL — ABNORMAL LOW (ref 12.0–15.0)
Immature Granulocytes: 0 %
Lymphocytes Relative: 19 %
Lymphs Abs: 1.5 10*3/uL (ref 0.7–4.0)
MCH: 33.2 pg (ref 26.0–34.0)
MCHC: 31.9 g/dL (ref 30.0–36.0)
MCV: 104 fL — ABNORMAL HIGH (ref 80.0–100.0)
Monocytes Absolute: 0.6 10*3/uL (ref 0.1–1.0)
Monocytes Relative: 8 %
Neutro Abs: 5.7 10*3/uL (ref 1.7–7.7)
Neutrophils Relative %: 71 %
Platelets: 200 10*3/uL (ref 150–400)
RBC: 3.46 MIL/uL — ABNORMAL LOW (ref 3.87–5.11)
RDW: 13.4 % (ref 11.5–15.5)
WBC: 7.9 10*3/uL (ref 4.0–10.5)
nRBC: 0 % (ref 0.0–0.2)

## 2021-03-23 LAB — PROTIME-INR
INR: 1 (ref 0.8–1.2)
Prothrombin Time: 12.7 seconds (ref 11.4–15.2)

## 2021-03-23 LAB — APTT: aPTT: 30 seconds (ref 24–36)

## 2021-03-23 NOTE — Progress Notes (Addendum)
COVID Vaccine Completed:  x2 Date COVID Vaccine completed:  12-18-19 & 01-07-20 Has received booster:  10-22-20 COVID vaccine manufacturer: Pfizer     Date of COVID positive in last 90 days: N/A  PCP - Josetta Huddle, MD Cardiologist - Rudean Haskell, MD  Chest x-ray - 02-11-21 Epic, CT chest 02-11-21 EKG - 02-16-21 Epic Stress Test - 2019 Epic ECHO - 02-02-21 Epic Cardiac Cath -  Pacemaker/ICD device last checked: Spinal Cord Stimulator:  Sleep Study - 2+ years ago, neg sleep apnea CPAP - Np  Fasting Blood Sugar - N/A Checks Blood Sugar _____ times a day  Blood Thinner Instructions:  N/A Aspirin Instructions: Last Dose:  Activity level:  Can go up a flight of stairs and perform activities of daily living without stopping and without symptoms of chest pain or shortness of breath.  Able to exercise without symptoms, patient does yoga two times a week, lives alone and continues to drive.   Anesthesia review: CHF, COPD.  Recent eval for syncope/bradycardia (code blue called in cardiology office 02-11-21).  Patient denies shortness of breath, fever, cough and chest pain at PAT appointment.  States that she feels much better since some of her meds were discontinued.   Patient verbalized understanding of instructions that were given to them at the PAT appointment. Patient was also instructed that they will need to review over the PAT instructions again at home before surgery.

## 2021-03-23 NOTE — Patient Instructions (Addendum)
DUE TO COVID-19 ONLY ONE VISITOR IS ALLOWED TO COME WITH YOU AND STAY IN THE WAITING ROOM ONLY DURING PRE OP AND PROCEDURE.   **NO VISITORS ARE ALLOWED IN THE SHORT STAY AREA OR RECOVERY ROOM!!**  IF YOU WILL BE ADMITTED INTO THE HOSPITAL YOU ARE ALLOWED ONLY TWO SUPPORT PEOPLE DURING VISITATION HOURS ONLY (10AM -8PM)   . The support person(s) may change daily. . The support person(s) must pass our screening, gel in and out, and wear a mask at all times, including in the patient's room. . Patients must also wear a mask when staff or their support person are in the room.  No visitors under the age of 62. Any visitor under the age of 67 must be accompanied by an adult.    COVID SWAB TESTING MUST BE COMPLETED ON: Friday, 03-27-21 @ 11:05 AM  [address] [place], [place] [address]  (Must self quarantine after testing. Follow instructions on handout.)        Your procedure is scheduled on: 03-30-21   Report to [facility] Main  Entrance    Report to admitting at 10:45 AM   Call this number if you have problems the morning of surgery [phone]   Do not eat food :After Midnight.   May have liquids until 10:15 AM   day of surgery  CLEAR LIQUID DIET  Foods Allowed                                                                     Foods Excluded  Water, Black Coffee and tea, regular and decaf             liquids that you cannot  Plain Jell-O in any flavor  (No red)                                    see through such as: Fruit ices (not with fruit pulp)                                      milk, soups, orange juice              Iced Popsicles (No red)                                      All solid food                                   Apple juices Sports drinks like Gatorade (No red) Lightly seasoned clear broth or consume(fat free) Sugar, honey syrup   Oral Hygiene is also important to reduce your risk of infection.                                    Remember - BRUSH YOUR  TEETH THE MORNING OF SURGERY WITH YOUR REGULAR TOOTHPASTE  Do NOT smoke after Midnight   Take these medicines the morning of surgery with A SIP OF WATER:  Proscar, Synthroid                               You may not have any metal on your body including hair pins, jewelry, and body piercings             Do not wear make-up, lotions, powders, perfumes/cologne, or deodorant             Do not wear nail polish.  Do not shave  48 hours prior to surgery.           Do not bring valuables to the hospital. [place].   Contacts, dentures or bridgework may not be worn into surgery.   Bring small overnight bag day of surgery.    Special Instructions: Bring a copy of your healthcare power of attorney and living will documents  the day of surgery if you haven't  scanned them in before.              Please read over the following fact sheets you were given: IF YOU HAVE QUESTIONS ABOUT YOUR PRE OP INSTRUCTIONS PLEASE CALL  [phone]   [facility] - Preparing for Surgery Before surgery, you can play an important role.  Because skin is not sterile, your skin needs to be as free of germs as possible.  You can reduce the number of germs on your skin by washing with CHG (chlorahexidine gluconate) soap before surgery.  CHG is an antiseptic cleaner which kills germs and bonds with the skin to continue killing germs even after washing. Please DO NOT use if you have an allergy to CHG or antibacterial soaps.  If your skin becomes reddened/irritated stop using the CHG and inform your nurse when you arrive at Short Stay. Do not shave (including legs and underarms) for at least 48 hours prior to the first CHG shower.  You may shave your face/neck.  Please follow these instructions carefully:  1.  Shower with CHG Soap the night before surgery and the  morning of surgery.  2.  If you choose to wash your hair, wash your hair first as usual with your normal  shampoo.  3.  After  you shampoo, rinse your hair and body thoroughly to remove the shampoo.                             4.  Use CHG as you would any other liquid soap.  You can apply chg directly to the skin and wash.  Gently with a scrungie or clean washcloth.  5.  Apply the CHG Soap to your body ONLY FROM THE NECK DOWN.   Do   not use on face/ open                           Wound or open sores. Avoid contact with eyes, ears mouth and   genitals (private parts).                       Wash face,  Genitals (private parts) with your normal soap.             6.  Wash thoroughly, paying special attention to  the area where your    surgery  will be performed.  7.  Thoroughly rinse your body with warm water from the neck down.  8.  DO NOT shower/wash with your normal soap after using and rinsing off the CHG Soap.                9.  Pat yourself dry with a clean towel.            10.  Wear clean pajamas.            11.  Place clean sheets on your bed the night of your first shower and do not  sleep with pets. Day of Surgery : Do not apply any lotions/deodorants the morning of surgery.  Please wear clean clothes to the hospital/surgery center.  FAILURE TO FOLLOW THESE INSTRUCTIONS MAY RESULT IN THE CANCELLATION OF YOUR SURGERY  PATIENT SIGNATURE_________________________________  NURSE SIGNATURE__________________________________  ________________________________________________________________________   Christina Sellers  An incentive spirometer is a tool that can help keep your lungs clear and active. This tool measures how well you are filling your lungs with each breath. Taking long deep breaths may help reverse or decrease the chance of developing breathing (pulmonary) problems (especially infection) following:  A long period of time when you are unable to move or be active. BEFORE THE PROCEDURE   If the spirometer includes an indicator to show your best effort, your nurse or respiratory therapist will set it  to a desired goal.  If possible, sit up straight or lean slightly forward. Try not to slouch.  Hold the incentive spirometer in an upright position. INSTRUCTIONS FOR USE  1. Sit on the edge of your bed if possible, or sit up as far as you can in bed or on a chair. 2. Hold the incentive spirometer in an upright position. 3. Breathe out normally. 4. Place the mouthpiece in your mouth and seal your lips tightly around it. 5. Breathe in slowly and as deeply as possible, raising the piston or the ball toward the top of the column. 6. Hold your breath for 3-5 seconds or for as long as possible. Allow the piston or ball to fall to the bottom of the column. 7. Remove the mouthpiece from your mouth and breathe out normally. 8. Rest for a few seconds and repeat Steps 1 through 7 at least 10 times every 1-2 hours when you are awake. Take your time and take a few normal breaths between deep breaths. 9. The spirometer may include an indicator to show your best effort. Use the indicator as a goal to work toward during each repetition. 10. After each set of 10 deep breaths, practice coughing to be sure your lungs are clear. If you have an incision (the cut made at the time of surgery), support your incision when coughing by placing a pillow or rolled up towels firmly against it. Once you are able to get out of bed, walk around indoors and cough well. You may stop using the incentive spirometer when instructed by your caregiver.  RISKS AND COMPLICATIONS  Take your time so you do not get dizzy or light-headed.  If you are in pain, you may need to take or ask for pain medication before doing incentive spirometry. It is harder to take a deep breath if you are having pain. AFTER USE  Rest and breathe slowly and easily.  It can be helpful to keep track of a log of your progress. Your caregiver can provide  you with a simple table to help with this. If you are using the spirometer at home, follow these  instructions: [place] IF:   You are having difficultly using the spirometer.  You have trouble using the spirometer as often as instructed.  Your pain medication is not giving enough relief while using the spirometer.  You develop fever of 100.5 F (38.1 C) or higher. SEEK IMMEDIATE MEDICAL CARE IF:   You cough up bloody sputum that had not been present before.  You develop fever of 102 F (38.9 C) or greater.  You develop worsening pain at or near the incision site. MAKE SURE YOU:   Understand these instructions.  Will watch your condition.  Will get help right away if you are not doing well or get worse. Document Released: 04/04/2007 Document Revised: 02/14/2012 Document Reviewed: 06/05/2007 Exit[employer] Patient Information 2014 [employer], [place].   ________________________________________________________________________  WHAT IS A BLOOD TRANSFUSION? Blood Transfusion Information  A transfusion is the replacement of blood or some of its parts. Blood is made up of multiple cells which provide different functions.  Red blood cells carry oxygen and are used for blood loss replacement.  White blood cells fight against infection.  Platelets control bleeding.  Plasma helps clot blood.  Other blood products are available for specialized needs, such as hemophilia or other clotting disorders. BEFORE THE TRANSFUSION  Who gives blood for transfusions?   Healthy volunteers who are fully evaluated to make sure their blood is safe. This is blood bank blood. Transfusion therapy is the safest it has ever been in the practice of medicine. Before blood is taken from a donor, a complete history is taken to make sure that person has no history of diseases nor engages in risky social behavior (examples are intravenous drug use or sexual activity with multiple partners). The donor's travel history is screened to minimize risk of transmitting infections, such as malaria. The donated  blood is tested for signs of infectious diseases, such as HIV and hepatitis. The blood is then tested to be sure it is compatible with you in order to minimize the chance of a transfusion reaction. If you or a relative donates blood, this is often done in anticipation of surgery and is not appropriate for emergency situations. It takes many days to process the donated blood. RISKS AND COMPLICATIONS Although transfusion therapy is very safe and saves many lives, the main dangers of transfusion include:   Getting an infectious disease.  Developing a transfusion reaction. This is an allergic reaction to something in the blood you were given. Every precaution is taken to prevent this. The decision to have a blood transfusion has been considered carefully by your caregiver before blood is given. Blood is not given unless the benefits outweigh the risks. AFTER THE TRANSFUSION  Right after receiving a blood transfusion, you will usually feel much better and more energetic. This is especially true if your red blood cells have gotten low (anemic). The transfusion raises the level of the red blood cells which carry oxygen, and this usually causes an energy increase.  The nurse administering the transfusion will monitor you carefully for complications. HOME CARE INSTRUCTIONS  No special instructions are needed after a transfusion. You may find your energy is better. Speak with your caregiver about any limitations on activity for underlying diseases you may have. SEEK MEDICAL CARE IF:   Your condition is not improving after your transfusion.  You develop redness or irritation at the intravenous (IV)  site. SEEK IMMEDIATE MEDICAL CARE IF:  Any of the following symptoms occur over the next 12 hours:  Shaking chills.  You have a temperature by mouth above 102 F (38.9 C), not controlled by medicine.  Chest, back, or muscle pain.  People around you feel you are not acting correctly or are  confused.  Shortness of breath or difficulty breathing.  Dizziness and fainting.  You get a rash or develop hives.  You have a decrease in urine output.  Your urine turns a dark color or changes to pink, red, or brown. Any of the following symptoms occur over the next 10 days:  You have a temperature by mouth above 102 F (38.9 C), not controlled by medicine.  Shortness of breath.  Weakness after normal activity.  The white part of the eye turns yellow (jaundice).  You have a decrease in the amount of urine or are urinating less often.  Your urine turns a dark color or changes to pink, red, or brown. Document Released: 11/19/2000 Document Revised: 02/14/2012 Document Reviewed: 07/08/2008 [facility] Patient Information 2014 [place], [place].  _______________________________________________________________________

## 2021-03-23 NOTE — Care Plan (Signed)
Ortho Bundle Case Management Note  Patient Details  Name: Christina Sellers MRN: [id number] Date of Birth: Jan 20, 1941  Met with patient in the office for H&P visit. She will discharge to home with family and hired caregivers. She has equipment at home. OPPT set up with [facility]. Patient and MD in agreement with plan. Choice offered.                  DME Arranged:    DME Agency:     HH Arranged:    HH Agency:     Additional Comments: Please contact me with any questions of if this plan should need to change.  Ladell Heads,  [facility]  [phone] 03/23/2021, 4:47 PM

## 2021-03-25 DIAGNOSIS — M1611 Unilateral primary osteoarthritis, right hip: Secondary | ICD-10-CM | POA: Diagnosis present

## 2021-03-25 NOTE — H&P (Signed)
TOTAL HIP ADMISSION H&P  Patient is admitted for right total hip arthroplasty.  Subjective:  Chief Complaint: right hip pain  HPI: Christina Sellers, 80 y.o. female, has a history of pain and functional disability in the right hip(s) due to arthritis and patient has failed non-surgical conservative treatments for greater than 12 weeks to include NSAID's and/or analgesics, flexibility and strengthening excercises, use of assistive devices, weight reduction as appropriate and activity modification.  Onset of symptoms was gradual starting several years ago with gradually worsening course since that time.The patient noted no past surgery on the right hip(s).  Patient currently rates pain in the right hip at 10 out of 10 with activity. Patient has night pain, worsening of pain with activity and weight bearing, trendelenberg gait, pain that interfers with activities of daily living and pain with passive range of motion. Patient has evidence of joint space narrowing by imaging studies. This condition presents safety issues increasing the risk of falls. This patient has had avascular necrosis of the hip, acetabular fracture, hip dysplasia.  There is no current active infection.  Patient Active Problem List   Diagnosis Date Noted  . Osteoarthritis of right hip 03/25/2021  . Orthostatic hypotension 02/27/2021  . Syncope, unspecified syncope type 02/12/2021  . History of chest pain 02/11/2021  . IBS (irritable bowel syndrome) 02/11/2021  . Hyponatremia 02/11/2021  . Acute cystitis without hematuria   . Chronic back pain   . Syncope 02/01/2021  . E-coli UTI 02/01/2021  . Dehydration with hyponatremia 02/01/2021  . Nausea and vomiting 02/01/2021  . Acute diarrhea 02/01/2021  . Chronic diastolic CHF (congestive heart failure) ([place]) 02/01/2021  . Metabolic acidosis 28/00/3491  . Preoperative cardiovascular examination 01/21/2021  . Bilateral edema of lower extremity 01/21/2021  . Hypertension  associated with diabetes ([place]) 01/21/2021  . Hyperlipidemia associated with type 2 diabetes mellitus ([place]) 01/21/2021  . Aortic atherosclerosis ([place]) 01/21/2021  . Chronic low back pain 10/05/2019  . Type 2 diabetes mellitus with hyperglycemia, without long-term current use of insulin ([place]) 02/21/2019  . Hypothyroidism 02/21/2019  . Pain of left hip joint 12/08/2018  . Dermatochalasis of both eyelids 07/04/2018  . Viral labyrinthitis 01/13/2017  . Atherosclerotic peripheral vascular disease ([place])   . Essential hypertension, benign 11/22/2013  . Osteopenia 11/22/2013  . Labial fusion    Past Medical History:  Diagnosis Date  . Abnormal Pap smear of vagina   . Allergic sinusitis   . Anxiety   . Atherosclerotic peripheral vascular disease ([place])   . Carotid artery stenosis   . Cellulitis   . COPD with asthma ([place])    not an issue now  . Depression   . Diabetes mellitus type 2, uncomplicated ([place])   . DJD (degenerative joint disease)   . Elevated cholesterol   . Elevated MCV    secondary to alchol  . GERD (gastroesophageal reflux disease)    not an issue now.  . Gout   . Hearing difficulty    bilateral hearing aids  . History of recurrent UTIs   . Hypertension    loss weight'off meds now"  . Hypothyroidism   . IBS (irritable bowel syndrome)   . Insomnia   . Labial fusion   . Osteoarthritis of left knee   . Osteopenia   . Recurrent vaginitis   . Sjogren's syndrome ([place])   . Syncope 11.7.14   secondary to dehydration and possible hypoglycemia  . Tendonitis   . Thyroid disease   . Transfusion history  age 29 "anemia"    Past Surgical History:  Procedure Laterality Date  . CATARACT EXTRACTION, BILATERAL Bilateral   . CHOLECYSTECTOMY     open  . COLONOSCOPY WITH PROPOFOL N/A 03/23/2016   Procedure: COLONOSCOPY WITH PROPOFOL;  Surgeon: Garlan Fair, MD;  Location: [facility];  Service: Endoscopy;  Laterality: N/A;  . EYE SURGERY    . FOOT SURGERY Left     retained hardware  . KNEE SURGERY Left    scope   . TONSILLECTOMY      No current facility-administered medications for this encounter.   Current Outpatient Medications  Medication Sig Dispense Refill Last Dose  . acetaminophen (TYLENOL) 325 MG tablet Take 2 tablets (650 mg total) by mouth every 4 (four) hours as needed for headache or mild pain. 20 tablet 0   . acetaminophen (TYLENOL) 500 MG tablet Take 1,000 mg by mouth every 8 (eight) hours as needed for moderate pain.     Marland Kitchen acetaminophen (TYLENOL) 650 MG CR tablet Take 1,300 mg by mouth every 8 (eight) hours as needed for pain.     Marland Kitchen ascorbic acid (VITAMIN C) 500 MG tablet Take 500 mg by mouth daily.     . Cholecalciferol (VITAMIN D3) 50 MCG (2000 UT) capsule Take 2,000 mg by mouth daily.     Marland Kitchen denosumab (PROLIA) 60 MG/ML SOSY injection Inject 60 mg into the skin every 6 (six) months.     . diclofenac sodium (VOLTAREN) 1 % GEL Apply 1 application topically 2 (two) times daily as needed (to painful sites).     . feeding supplement (ENSURE ENLIVE / ENSURE PLUS) LIQD Take 237 mLs by mouth 2 (two) times daily between meals. (Patient taking differently: Take 237 mLs by mouth daily.) 237 mL 12   . finasteride (PROSCAR) 5 MG tablet Take 2.5 mg by mouth daily.     . fluticasone (FLONASE) 50 MCG/ACT nasal spray Place 1 spray into both nostrils daily as needed (for seasonal allergies).     . hypromellose (SYSTANE OVERNIGHT THERAPY) 0.3 % GEL ophthalmic ointment Place 1 application into both eyes at bedtime as needed for dry eyes.     Marland Kitchen levothyroxine (SYNTHROID) 25 MCG tablet Take 1-4 tablets (25-100 mcg total) by mouth See admin instructions. Take 25 mcg by mouth in the morning before breakfast on Sun/Tues/Thurs/Sat and 100 mcg on Mon/Wed/Fri (Patient taking differently: Take 25 mcg by mouth See admin instructions. Sun/Tues/Thurs/Sat) 30 tablet 0   . levothyroxine (SYNTHROID) 50 MCG tablet Take 1 tablet (50 mcg total) by mouth every Monday, Wednesday,  and Friday.     . Menthol, Topical Analgesic, (BIOFREEZE EX) Apply 1 application topically 2 (two) times daily as needed (for knee pain).     . methocarbamol (ROBAXIN) 500 MG tablet Take 250 mg by mouth 3 (three) times daily as needed for muscle spasms (or back pain).     . Methylcobalamin 1000 MCG TBDP Take 1,000 mcg by mouth daily.     . Multiple Vitamins-Minerals (MULTIVITAMIN WITH MINERALS) tablet Take 1 tablet by mouth daily.     . ondansetron (ZOFRAN ODT) 4 MG disintegrating tablet Take 1 tablet (4 mg total) by mouth every 8 (eight) hours as needed for nausea or vomiting. 20 tablet 0   . OVER THE COUNTER MEDICATION Take 2 capsules by mouth daily. Hydro Eye     . potassium chloride SA (KLOR-CON) 20 MEQ tablet Take 1 tablet (20 mEq total) by mouth daily. 20 tablet 0   .  Psyllium (METAMUCIL PO) Take 1 Scoop by mouth at bedtime as needed (constipation- mix and drink as directed).     . RESTASIS 0.05 % ophthalmic emulsion Place 1 drop into both eyes 2 (two) times daily.     . rosuvastatin (CRESTOR) 5 MG tablet Take 5 mg by mouth daily.     . vitamin E 200 UNIT capsule Take 200 Units by mouth daily.     . blood glucose meter kit and supplies KIT Dispense based on patient and insurance preference. Use up to four times daily as directed. 1 each 0    Allergies  Allergen Reactions  . Tape Other (See Comments)    SKIN IS VERY THIN- TAPE TEARS VERY EASILY!!  . Celexa [Citalopram Hydrobromide] Other (See Comments)    Nightmares   . Fluconazole Other (See Comments)    Unknown reaction  . Green Tea Leaf Ext Other (See Comments)    Mouth ulcers  . Kenalog [Triamcinolone] Other (See Comments)    Spinal injection caused sweats  . Levaquin [Levofloxacin] Hives  . Losartan Potassium-Hctz Other (See Comments)    Night sweats/ fatigue  . Methocarbamol Other (See Comments)    500 mg is too much- can tolerate a lesser dose  . Nitrofurantoin Macrocrystal Other (See Comments)    Possible vertigo  .  Scopolamine Other (See Comments)    Memory change  . Sulfa Antibiotics Other (See Comments)    Reaction not recalled by the patient  . Trimethoprim Hives    Social History   Tobacco Use  . Smoking status: Former Smoker    Quit date: 02/21/1982    Years since quitting: 39.1  . Smokeless tobacco: Never Used  Substance Use Topics  . Alcohol use: No    Family History  Problem Relation Age of Onset  . Diabetes Maternal Aunt   . Cancer Father        Oral cancer  . Breast cancer Sister        Age 38's  . Cancer Brother        Lung cancer     Review of Systems  Constitutional: Negative.   HENT: Positive for hearing loss.   Eyes: Negative.   Respiratory: Negative.   Cardiovascular: Positive for leg swelling.  Gastrointestinal: Negative.   Endocrine: Negative.   Genitourinary: Negative.   Musculoskeletal: Positive for arthralgias.  Skin: Negative.   Allergic/Immunologic: Negative.   Neurological: Positive for syncope.  Hematological: Negative.   Psychiatric/Behavioral: Negative.     Objective:  Physical Exam Constitutional:      Appearance: Normal appearance. She is normal weight.  HENT:     Head: Normocephalic and atraumatic.     Nose: Nose normal.  Eyes:     Pupils: Pupils are equal, round, and reactive to light.  Cardiovascular:     Pulses: Normal pulses.  Pulmonary:     Effort: Pulmonary effort is normal.  Musculoskeletal:        General: Tenderness present.     Cervical back: Normal range of motion and neck supple.     Comments: The right hip is still irritable foot tap is mildly positive internal rotation hurts a 10 external rotation at 30   Skin:    General: Skin is warm and dry.  Neurological:     General: No focal deficit present.     Mental Status: She is alert and oriented to person, place, and time. Mental status is at baseline.  Psychiatric:  Mood and Affect: Mood normal.        Behavior: Behavior normal.        Thought Content: Thought  content normal.        Judgment: Judgment normal.     Vital signs in last 24 hours:    Labs:   Estimated body mass index is 27.09 kg/m as calculated from the following:   Height as of 03/23/21: 4' 10" (1.473 m).   Weight as of 03/23/21: 58.8 kg.   Imaging Review Plain radiographs demonstrate  AP of the pelvis and crosstable lateral of the right hip are taken and reviewed in office today.  Patient does have avascular necrosis of bilateral hips with the right hip showing flattening of the femoral head and joint space narrowing to the point where she is nearly bone-on-bone.   Assessment/Plan:  End stage arthritis, right hip(s)  The patient history, physical examination, clinical judgement of the provider and imaging studies are consistent with end stage degenerative joint disease of the right hip(s) and total hip arthroplasty is deemed medically necessary. The treatment options including medical management, injection therapy, arthroscopy and arthroplasty were discussed at length. The risks and benefits of total hip arthroplasty were presented and reviewed. The risks due to aseptic loosening, infection, stiffness, dislocation/subluxation,  thromboembolic complications and other imponderables were discussed.  The patient acknowledged the explanation, agreed to proceed with the plan and consent was signed. Patient is being admitted for inpatient treatment for surgery, pain control, PT, OT, prophylactic antibiotics, VTE prophylaxis, progressive ambulation and ADL's and discharge planning.The patient is planning to be discharged home with home health services    Patient's anticipated LOS is less than 2 midnights, meeting these requirements: - Younger than 10 - Lives within 1 hour of care - Has a competent adult at home to recover with post-op recover - NO history of  - Chronic pain requiring opiods  - Diabetes  - Coronary Artery Disease  - Heart failure  - Heart attack  - Stroke  -  DVT/VTE  - Cardiac arrhythmia  - Respiratory Failure/COPD  - Renal failure  - Anemia  - Advanced Liver disease

## 2021-03-25 NOTE — Progress Notes (Signed)
Spoke to patient and gave her updated surgery time of 2 PM.  Patient will arrive at 11:30 and stop clear liquids at 11:00.

## 2021-03-26 NOTE — Anesthesia Preprocedure Evaluation (Addendum)
Anesthesia Evaluation  Patient identified by MRN, date of birth, ID band Patient awake    Reviewed: Allergy & Precautions, NPO status , Patient's Chart, lab work & pertinent test results  Airway Mallampati: II  TM Distance: >3 FB Neck ROM: Full    Dental no notable dental hx.    Pulmonary neg pulmonary ROS, former smoker,    Pulmonary exam normal breath sounds clear to auscultation       Cardiovascular hypertension, + Peripheral Vascular Disease  Normal cardiovascular exam Rhythm:Regular Rate:Normal     Neuro/Psych negative neurological ROS  negative psych ROS   GI/Hepatic Neg liver ROS, GERD  ,  Endo/Other  diabetes  Renal/GU negative Renal ROS  negative genitourinary   Musculoskeletal negative musculoskeletal ROS (+)   Abdominal   Peds negative pediatric ROS (+)  Hematology negative hematology ROS (+)   Anesthesia Other Findings   Reproductive/Obstetrics negative OB ROS                            Anesthesia Physical Anesthesia Plan  ASA: III  Anesthesia Plan: Spinal   Post-op Pain Management:    Induction: Intravenous  PONV Risk Score and Plan: 2 and Ondansetron, Dexamethasone and Treatment may vary due to age or medical condition  Airway Management Planned: Simple Face Mask  Additional Equipment:   Intra-op Plan:   Post-operative Plan:   Informed Consent: I have reviewed the patients History and Physical, chart, labs and discussed the procedure including the risks, benefits and alternatives for the proposed anesthesia with the patient or authorized representative who has indicated his/her understanding and acceptance.     Dental advisory given  Plan Discussed with: CRNA and Surgeon  Anesthesia Plan Comments: (See APP note by Durel Salts, FNP )       Anesthesia Quick Evaluation

## 2021-03-26 NOTE — Progress Notes (Signed)
Anesthesia Chart Review:   Case: [id number] Date/Time: 03/30/21 1345   Procedure: RIGHTTOTAL HIP ARTHROPLASTY ANTERIOR APPROACH (Right Hip)   Anesthesia type: Spinal   Pre-op diagnosis: RIGHT HIP OSTEOARTHRITIS AND AVASCULAR NECROSIS   Location: [facility] ROOM 08 / [facility]   Surgeons: Frederik Pear, MD      DISCUSSION: Pt is 80 years old with hx mild mitral regurgitation, HTN, carotid artery stenosis, DM, Sjogren's syndrome, COPD, asthma  Hospitalized 3/9-3/14/22 for syncope, PEA arrest. Was hyponatremic (Na 121). Determined to havre vaso-vagal syncope due to dehydration due to diuretics and recent diarrhea. Hyponatremia due to Maxzide- plan to avoid this med indefinitely. Norvasc and metoprolol held due to hypotension.    VS: BP (!) 168/62   Pulse 70   Temp 36.6 C (Oral)   Resp 16   Ht 4' 10"  (1.473 m)   Wt 58.8 kg   SpO2 100%   BMI 27.09 kg/m   PROVIDERS: - PCP is Josetta Huddle, MD  - Cardiologist is Rudean Haskell, MD. Last office visit 02/27/21 post-hospital discharge. Note documents "given her labile BP we will tolerate higher BP readings." Cleared pt for surgery at this office visit.    LABS: Labs reviewed: Acceptable for surgery. (all labs ordered are listed, but only abnormal results are displayed)  Labs Reviewed  CBC WITH DIFFERENTIAL/PLATELET - Abnormal; Notable for the following components:      Result Value   RBC 3.46 (*)    Hemoglobin 11.5 (*)    MCV 104.0 (*)    All other components within normal limits  BASIC METABOLIC PANEL - Abnormal; Notable for the following components:   Glucose, Bld 105 (*)    BUN 30 (*)    All other components within normal limits  URINALYSIS, ROUTINE W REFLEX MICROSCOPIC - Abnormal; Notable for the following components:   Protein, ur 30 (*)    Leukocytes,Ua MODERATE (*)    Bacteria, UA RARE (*)    All other components within normal limits  SURGICAL PCR SCREEN  PROTIME-INR  APTT  TYPE AND SCREEN     IMAGES: 1 view CXR  02/11/21:  - No acute cardiopulmonary disease. Low lung volumes with mild bibasilar atelectasis  EKG:   CV: Carotid duplex 02/14/21:  - Right Carotid: Velocities in the right ICA are consistent with a 40-59% stenosis.  - Left Carotid: Velocities in the left ICA are consistent with a 1-39% stenosis.  - Vertebrals: Right vertebral artery demonstrates antegrade flow. Left  vertebral artery demonstrates bidirectional flow.  - Subclavians: Normal flow hemodynamics were seen in bilateral subclavian arteries.   Echo 02/02/21:  1. Left ventricular ejection fraction, by estimation, is 65 to 70%. The left ventricle has normal function. The left ventricle has no regional wall motion abnormalities. Left ventricular diastolic parameters are consistent with Grade I diastolic dysfunction (impaired relaxation).  2. Right ventricular systolic function is normal. The right ventricular size is mildly enlarged. There is normal pulmonary artery systolic pressure.  3. Left atrial size was mild to moderately dilated.  4. The mitral valve is normal in structure. Mild mitral valve regurgitation. No evidence of mitral stenosis. Moderate mitral annular calcification.  5. Tricuspid valve regurgitation is mild to moderate.  6. The aortic valve is normal in structure. Aortic valve regurgitation is not visualized. No aortic stenosis is present.  7. The inferior vena cava is normal in size with greater than 50% respiratory variability, suggesting right atrial pressure of 3 mmHg.    Nuclear stress test 04/14/18:  Nuclear stress EF: 77%.  The left ventricular ejection fraction is hyperdynamic (>65%).  There was no ST segment deviation noted during stress.  The study is normal.  This is a low risk study.    Past Medical History:  Diagnosis Date  . Abnormal Pap smear of vagina   . Allergic sinusitis   . Anxiety   . Atherosclerotic peripheral vascular disease ([place])   . Carotid artery stenosis   .  Cellulitis   . COPD with asthma ([place])    not an issue now  . Depression   . Diabetes mellitus type 2, uncomplicated ([place])   . DJD (degenerative joint disease)   . Elevated cholesterol   . Elevated MCV    secondary to alchol  . GERD (gastroesophageal reflux disease)    not an issue now.  . Gout   . Hearing difficulty    bilateral hearing aids  . History of recurrent UTIs   . Hypertension    loss weight'off meds now"  . Hypothyroidism   . IBS (irritable bowel syndrome)   . Insomnia   . Labial fusion   . Osteoarthritis of left knee   . Osteopenia   . Recurrent vaginitis   . Sjogren's syndrome ([place])   . Syncope 11.7.14   secondary to dehydration and possible hypoglycemia  . Tendonitis   . Thyroid disease   . Transfusion history    age 82 "anemia"    Past Surgical History:  Procedure Laterality Date  . CATARACT EXTRACTION, BILATERAL Bilateral   . CHOLECYSTECTOMY     open  . COLONOSCOPY WITH PROPOFOL N/A 03/23/2016   Procedure: COLONOSCOPY WITH PROPOFOL;  Surgeon: Garlan Fair, MD;  Location: [facility];  Service: Endoscopy;  Laterality: N/A;  . EYE SURGERY    . FOOT SURGERY Left    retained hardware  . KNEE SURGERY Left    scope   . TONSILLECTOMY      MEDICATIONS: . acetaminophen (TYLENOL) 325 MG tablet  . acetaminophen (TYLENOL) 500 MG tablet  . acetaminophen (TYLENOL) 650 MG CR tablet  . ascorbic acid (VITAMIN C) 500 MG tablet  . blood glucose meter kit and supplies KIT  . Cholecalciferol (VITAMIN D3) 50 MCG (2000 UT) capsule  . denosumab (PROLIA) 60 MG/ML SOSY injection  . diclofenac sodium (VOLTAREN) 1 % GEL  . feeding supplement (ENSURE ENLIVE / ENSURE PLUS) LIQD  . finasteride (PROSCAR) 5 MG tablet  . fluticasone (FLONASE) 50 MCG/ACT nasal spray  . hypromellose (SYSTANE OVERNIGHT THERAPY) 0.3 % GEL ophthalmic ointment  . levothyroxine (SYNTHROID) 25 MCG tablet  . levothyroxine (SYNTHROID) 50 MCG tablet  . Menthol, Topical Analgesic, (BIOFREEZE EX)   . methocarbamol (ROBAXIN) 500 MG tablet  . Methylcobalamin 1000 MCG TBDP  . Multiple Vitamins-Minerals (MULTIVITAMIN WITH MINERALS) tablet  . ondansetron (ZOFRAN ODT) 4 MG disintegrating tablet  . OVER THE COUNTER MEDICATION  . potassium chloride SA (KLOR-CON) 20 MEQ tablet  . Psyllium (METAMUCIL PO)  . RESTASIS 0.05 % ophthalmic emulsion  . rosuvastatin (CRESTOR) 5 MG tablet  . vitamin E 200 UNIT capsule   No current facility-administered medications for this encounter.    If no changes, I anticipate pt can proceed with surgery as scheduled.   Willeen Cass, PhD, FNP-BC [facility]/Anesthesiology Phone: [phone] 03/26/2021 9:43 AM

## 2021-03-27 ENCOUNTER — Other Ambulatory Visit (HOSPITAL_COMMUNITY)
Admission: RE | Admit: 2021-03-27 | Discharge: 2021-03-27 | Disposition: A | Discharge status: Home / Self Care | Payer: Medicare Other | Source: Ambulatory Visit | Attending: Orthopedic Surgery | Admitting: Orthopedic Surgery

## 2021-03-27 DIAGNOSIS — Z01812 Encounter for preprocedural laboratory examination: Secondary | ICD-10-CM | POA: Insufficient documentation

## 2021-03-27 DIAGNOSIS — Z20822 Contact with and (suspected) exposure to covid-19: Secondary | ICD-10-CM | POA: Diagnosis not present

## 2021-03-28 LAB — SARS CORONAVIRUS 2 (TAT 6-24 HRS): SARS Coronavirus 2: NEGATIVE

## 2021-03-29 MED ORDER — BUPIVACAINE LIPOSOME 1.3 % IJ SUSP
10.0000 mL | INTRAMUSCULAR | Status: DC
  Filled 2021-03-29: qty 10

## 2021-03-29 MED ORDER — TRANEXAMIC ACID 1000 MG/10ML IV SOLN
2000.0000 mg | INTRAVENOUS | Status: DC
  Filled 2021-03-29: qty 20

## 2021-03-30 ENCOUNTER — Ambulatory Visit (HOSPITAL_COMMUNITY): Payer: Medicare Other

## 2021-03-30 ENCOUNTER — Encounter (HOSPITAL_COMMUNITY): Payer: Self-pay | Admitting: Orthopedic Surgery

## 2021-03-30 ENCOUNTER — Other Ambulatory Visit: Payer: Self-pay

## 2021-03-30 ENCOUNTER — Encounter (HOSPITAL_COMMUNITY)
Admission: RE | Disposition: A | Discharge status: Home / Self Care | Payer: Self-pay | Source: Home / Self Care | Attending: Orthopedic Surgery

## 2021-03-30 ENCOUNTER — Ambulatory Visit (HOSPITAL_COMMUNITY): Payer: Medicare Other | Admitting: Emergency Medicine

## 2021-03-30 ENCOUNTER — Observation Stay (HOSPITAL_COMMUNITY)
Admission: RE | Admit: 2021-03-30 | Discharge: 2021-04-02 | Disposition: A | Discharge status: Home / Self Care | Payer: Medicare Other | Attending: Orthopedic Surgery | Admitting: Orthopedic Surgery

## 2021-03-30 ENCOUNTER — Ambulatory Visit (HOSPITAL_COMMUNITY): Payer: Medicare Other | Admitting: Anesthesiology

## 2021-03-30 DIAGNOSIS — Z96641 Presence of right artificial hip joint: Secondary | ICD-10-CM | POA: Diagnosis not present

## 2021-03-30 DIAGNOSIS — Z419 Encounter for procedure for purposes other than remedying health state, unspecified: Secondary | ICD-10-CM

## 2021-03-30 DIAGNOSIS — Z01818 Encounter for other preprocedural examination: Secondary | ICD-10-CM

## 2021-03-30 DIAGNOSIS — Z87891 Personal history of nicotine dependence: Secondary | ICD-10-CM | POA: Diagnosis not present

## 2021-03-30 DIAGNOSIS — E1165 Type 2 diabetes mellitus with hyperglycemia: Secondary | ICD-10-CM | POA: Diagnosis not present

## 2021-03-30 DIAGNOSIS — Z79899 Other long term (current) drug therapy: Secondary | ICD-10-CM | POA: Insufficient documentation

## 2021-03-30 DIAGNOSIS — M87051 Idiopathic aseptic necrosis of right femur: Secondary | ICD-10-CM | POA: Diagnosis not present

## 2021-03-30 DIAGNOSIS — M1611 Unilateral primary osteoarthritis, right hip: Principal | ICD-10-CM | POA: Insufficient documentation

## 2021-03-30 DIAGNOSIS — E039 Hypothyroidism, unspecified: Secondary | ICD-10-CM | POA: Diagnosis not present

## 2021-03-30 DIAGNOSIS — M87851 Other osteonecrosis, right femur: Secondary | ICD-10-CM | POA: Diagnosis not present

## 2021-03-30 DIAGNOSIS — I5032 Chronic diastolic (congestive) heart failure: Secondary | ICD-10-CM | POA: Diagnosis not present

## 2021-03-30 DIAGNOSIS — Z471 Aftercare following joint replacement surgery: Secondary | ICD-10-CM | POA: Diagnosis not present

## 2021-03-30 DIAGNOSIS — I110 Hypertensive heart disease with heart failure: Secondary | ICD-10-CM | POA: Diagnosis not present

## 2021-03-30 HISTORY — PX: TOTAL HIP ARTHROPLASTY: SHX124

## 2021-03-30 LAB — TYPE AND SCREEN
ABO/RH(D): O POS
Antibody Screen: NEGATIVE

## 2021-03-30 LAB — ABO/RH: ABO/RH(D): O POS

## 2021-03-30 LAB — GLUCOSE, CAPILLARY: Glucose-Capillary: 95 mg/dL (ref 70–99)

## 2021-03-30 SURGERY — ARTHROPLASTY, HIP, TOTAL, ANTERIOR APPROACH
Anesthesia: Spinal | Site: Hip | Laterality: Right

## 2021-03-30 MED ORDER — PANTOPRAZOLE SODIUM 40 MG PO TBEC
40.0000 mg | DELAYED_RELEASE_TABLET | Freq: Every day | ORAL | Status: DC
  Administered 2021-03-31 – 2021-04-02 (×3): 40 mg via ORAL
  Filled 2021-03-30 (×3): qty 1

## 2021-03-30 MED ORDER — ARTIFICIAL TEARS OPHTHALMIC OINT: 1.0000 "application " | TOPICAL_OINTMENT | Freq: Every evening | OPHTHALMIC | Status: DC | PRN

## 2021-03-30 MED ORDER — PHENOL 1.4 % MT LIQD: 1.0000 | OROMUCOSAL | Status: DC | PRN

## 2021-03-30 MED ORDER — OXYCODONE-ACETAMINOPHEN 5-325 MG PO TABS: 1.0000 | ORAL_TABLET | ORAL | 0 refills | Status: DC | PRN

## 2021-03-30 MED ORDER — DOCUSATE SODIUM 100 MG PO CAPS
100.0000 mg | ORAL_CAPSULE | Freq: Two times a day (BID) | ORAL | Status: DC
Start: 2021-03-30 — End: 2021-04-02
  Administered 2021-03-31 – 2021-04-02 (×5): 100 mg via ORAL
  Filled 2021-03-30 (×6): qty 1

## 2021-03-30 MED ORDER — BUPIVACAINE-EPINEPHRINE (PF) 0.25% -1:200000 IJ SOLN
INTRAMUSCULAR | Status: AC
  Filled 2021-03-30: qty 30

## 2021-03-30 MED ORDER — ALUM & MAG HYDROXIDE-SIMETH 200-200-20 MG/5ML PO SUSP: 30.0000 mL | ORAL | Status: DC | PRN

## 2021-03-30 MED ORDER — PROPOFOL 10 MG/ML IV BOLUS
INTRAVENOUS | Status: AC
  Filled 2021-03-30: qty 20

## 2021-03-30 MED ORDER — POLYETHYLENE GLYCOL 3350 17 G PO PACK: 17.0000 g | PACK | Freq: Every day | ORAL | Status: DC | PRN

## 2021-03-30 MED ORDER — ASPIRIN 81 MG PO CHEW
81.0000 mg | CHEWABLE_TABLET | Freq: Two times a day (BID) | ORAL | Status: DC
  Administered 2021-03-30 – 2021-04-02 (×6): 81 mg via ORAL
  Filled 2021-03-30 (×6): qty 1

## 2021-03-30 MED ORDER — HYDROMORPHONE HCL 1 MG/ML IJ SOLN
0.5000 mg | INTRAMUSCULAR | Status: DC | PRN
  Administered 2021-03-30 – 2021-03-31 (×2): 1 mg via INTRAVENOUS
  Filled 2021-03-30 (×2): qty 1

## 2021-03-30 MED ORDER — METHOCARBAMOL 500 MG PO TABS: 250.0000 mg | ORAL_TABLET | Freq: Three times a day (TID) | ORAL | Status: DC | PRN

## 2021-03-30 MED ORDER — ACETAMINOPHEN 500 MG PO TABS
1000.0000 mg | ORAL_TABLET | Freq: Four times a day (QID) | ORAL | Status: AC
  Administered 2021-03-30 – 2021-03-31 (×3): 1000 mg via ORAL
  Filled 2021-03-30 (×4): qty 2

## 2021-03-30 MED ORDER — LEVOTHYROXINE SODIUM 75 MCG PO TABS
75.0000 ug | ORAL_TABLET | Freq: Every day | ORAL | Status: DC
  Administered 2021-03-31 – 2021-04-02 (×3): 75 ug via ORAL
  Filled 2021-03-30 (×3): qty 1

## 2021-03-30 MED ORDER — ONDANSETRON HCL 4 MG/2ML IJ SOLN
INTRAMUSCULAR | Status: AC
  Filled 2021-03-30: qty 2

## 2021-03-30 MED ORDER — ONDANSETRON 4 MG PO TBDP: 4.0000 mg | ORAL_TABLET | Freq: Three times a day (TID) | ORAL | Status: DC | PRN

## 2021-03-30 MED ORDER — HYDROMORPHONE HCL 1 MG/ML IJ SOLN
0.2500 mg | INTRAMUSCULAR | Status: DC | PRN
  Administered 2021-03-30: 0.5 mg via INTRAVENOUS

## 2021-03-30 MED ORDER — KCL IN DEXTROSE-NACL 20-5-0.45 MEQ/L-%-% IV SOLN
INTRAVENOUS | Status: DC
  Filled 2021-03-30 (×4): qty 1000

## 2021-03-30 MED ORDER — DEXAMETHASONE SODIUM PHOSPHATE 10 MG/ML IJ SOLN
10.0000 mg | Freq: Once | INTRAMUSCULAR | Status: AC
  Administered 2021-03-31: 10 mg via INTRAVENOUS
  Filled 2021-03-30: qty 1

## 2021-03-30 MED ORDER — DEXAMETHASONE SODIUM PHOSPHATE 10 MG/ML IJ SOLN
INTRAMUSCULAR | Status: AC
  Filled 2021-03-30: qty 1

## 2021-03-30 MED ORDER — LACTATED RINGERS IV SOLN: INTRAVENOUS | Status: DC

## 2021-03-30 MED ORDER — ALBUMIN HUMAN 5 % IV SOLN: INTRAVENOUS | Status: DC | PRN

## 2021-03-30 MED ORDER — PHENYLEPHRINE HCL-NACL 10-0.9 MG/250ML-% IV SOLN
INTRAVENOUS | Status: DC | PRN
  Administered 2021-03-30: 25 ug/min via INTRAVENOUS

## 2021-03-30 MED ORDER — ONDANSETRON HCL 4 MG/2ML IJ SOLN
4.0000 mg | Freq: Once | INTRAMUSCULAR | Status: DC | PRN
Start: 2021-03-30 — End: 2021-03-30

## 2021-03-30 MED ORDER — CHLORHEXIDINE GLUCONATE 0.12 % MT SOLN
15.0000 mL | Freq: Once | OROMUCOSAL | Status: AC
  Administered 2021-03-30: 15 mL via OROMUCOSAL

## 2021-03-30 MED ORDER — BISACODYL 5 MG PO TBEC: 5.0000 mg | DELAYED_RELEASE_TABLET | Freq: Every day | ORAL | Status: DC | PRN

## 2021-03-30 MED ORDER — MIDAZOLAM HCL 2 MG/2ML IJ SOLN
INTRAMUSCULAR | Status: AC
  Filled 2021-03-30: qty 2

## 2021-03-30 MED ORDER — TRANEXAMIC ACID 1000 MG/10ML IV SOLN
INTRAVENOUS | Status: DC | PRN
  Administered 2021-03-30: 2000 mg via TOPICAL

## 2021-03-30 MED ORDER — POTASSIUM CHLORIDE CRYS ER 20 MEQ PO TBCR
20.0000 meq | EXTENDED_RELEASE_TABLET | Freq: Every day | ORAL | Status: DC
  Administered 2021-03-31 – 2021-04-02 (×3): 20 meq via ORAL
  Filled 2021-03-30 (×3): qty 1

## 2021-03-30 MED ORDER — PROPOFOL 1000 MG/100ML IV EMUL
INTRAVENOUS | Status: AC
  Filled 2021-03-30: qty 100

## 2021-03-30 MED ORDER — PROPOFOL 500 MG/50ML IV EMUL
INTRAVENOUS | Status: DC | PRN
  Administered 2021-03-30: 50 ug/kg/min via INTRAVENOUS
  Administered 2021-03-30: 75 ug/kg/min via INTRAVENOUS

## 2021-03-30 MED ORDER — FLUTICASONE PROPIONATE 50 MCG/ACT NA SUSP: 1.0000 | Freq: Every day | NASAL | Status: DC | PRN

## 2021-03-30 MED ORDER — ORAL CARE MOUTH RINSE: 15.0000 mL | Freq: Once | OROMUCOSAL | Status: AC

## 2021-03-30 MED ORDER — FLEET ENEMA 7-19 GM/118ML RE ENEM: 1.0000 | ENEMA | Freq: Once | RECTAL | Status: DC | PRN

## 2021-03-30 MED ORDER — FENTANYL CITRATE (PF) 100 MCG/2ML IJ SOLN
INTRAMUSCULAR | Status: AC
  Filled 2021-03-30: qty 2

## 2021-03-30 MED ORDER — DIPHENHYDRAMINE HCL 12.5 MG/5ML PO ELIX: 12.5000 mg | ORAL_SOLUTION | ORAL | Status: DC | PRN

## 2021-03-30 MED ORDER — HYDROMORPHONE HCL 1 MG/ML IJ SOLN
INTRAMUSCULAR | Status: AC
  Administered 2021-03-30: 0.5 mg via INTRAVENOUS
  Filled 2021-03-30: qty 1

## 2021-03-30 MED ORDER — AMISULPRIDE (ANTIEMETIC) 5 MG/2ML IV SOLN
INTRAVENOUS | Status: AC
  Administered 2021-03-30: 10 mg
  Filled 2021-03-30: qty 4

## 2021-03-30 MED ORDER — TRANEXAMIC ACID-NACL 1000-0.7 MG/100ML-% IV SOLN
1000.0000 mg | Freq: Once | INTRAVENOUS | Status: AC
  Administered 2021-03-30: 1000 mg via INTRAVENOUS
  Filled 2021-03-30: qty 100

## 2021-03-30 MED ORDER — BUPIVACAINE LIPOSOME 1.3 % IJ SUSP
INTRAMUSCULAR | Status: DC | PRN
  Administered 2021-03-30: 30 mL

## 2021-03-30 MED ORDER — OXYCODONE HCL 5 MG PO TABS
5.0000 mg | ORAL_TABLET | ORAL | Status: DC | PRN
  Administered 2021-03-30 – 2021-04-01 (×3): 10 mg via ORAL
  Filled 2021-03-30 (×4): qty 2

## 2021-03-30 MED ORDER — METOCLOPRAMIDE HCL 5 MG/ML IJ SOLN
5.0000 mg | Freq: Three times a day (TID) | INTRAMUSCULAR | Status: DC | PRN
Start: 2021-03-30 — End: 2021-04-02
  Administered 2021-03-30 – 2021-03-31 (×2): 5 mg via INTRAVENOUS
  Filled 2021-03-30 (×2): qty 2

## 2021-03-30 MED ORDER — OXYCODONE HCL 5 MG/5ML PO SOLN: 5.0000 mg | Freq: Once | ORAL | Status: DC | PRN

## 2021-03-30 MED ORDER — ASPIRIN EC 81 MG PO TBEC: 81.0000 mg | DELAYED_RELEASE_TABLET | Freq: Two times a day (BID) | ORAL | 0 refills | Status: DC

## 2021-03-30 MED ORDER — CEFAZOLIN SODIUM-DEXTROSE 2-4 GM/100ML-% IV SOLN
2.0000 g | INTRAVENOUS | Status: AC
  Administered 2021-03-30: 2 g via INTRAVENOUS
  Filled 2021-03-30: qty 100

## 2021-03-30 MED ORDER — BUPIVACAINE-EPINEPHRINE 0.25% -1:200000 IJ SOLN
INTRAMUSCULAR | Status: DC | PRN
  Administered 2021-03-30: 30 mL

## 2021-03-30 MED ORDER — TRANEXAMIC ACID-NACL 1000-0.7 MG/100ML-% IV SOLN
1000.0000 mg | INTRAVENOUS | Status: AC
  Administered 2021-03-30: 1000 mg via INTRAVENOUS
  Filled 2021-03-30: qty 100

## 2021-03-30 MED ORDER — OXYCODONE HCL 5 MG PO TABS: 5.0000 mg | ORAL_TABLET | Freq: Once | ORAL | Status: DC | PRN

## 2021-03-30 MED ORDER — ONDANSETRON HCL 4 MG/2ML IJ SOLN
4.0000 mg | Freq: Four times a day (QID) | INTRAMUSCULAR | Status: DC | PRN
  Administered 2021-03-30 – 2021-03-31 (×3): 4 mg via INTRAVENOUS
  Filled 2021-03-30 (×3): qty 2

## 2021-03-30 MED ORDER — BUPIVACAINE IN DEXTROSE 0.75-8.25 % IT SOLN
INTRATHECAL | Status: DC | PRN
  Administered 2021-03-30: 1.4 mL via INTRATHECAL

## 2021-03-30 MED ORDER — FINASTERIDE 5 MG PO TABS
2.5000 mg | ORAL_TABLET | Freq: Every day | ORAL | Status: DC
  Administered 2021-03-31 – 2021-04-02 (×3): 2.5 mg via ORAL
  Filled 2021-03-30 (×3): qty 0.5

## 2021-03-30 MED ORDER — POVIDONE-IODINE 10 % EX SWAB
2.0000 "application " | Freq: Once | CUTANEOUS | Status: AC
  Administered 2021-03-30: 2 via TOPICAL

## 2021-03-30 MED ORDER — SODIUM CHLORIDE 0.9% FLUSH
INTRAVENOUS | Status: DC | PRN
  Administered 2021-03-30: 50 mL

## 2021-03-30 MED ORDER — MENTHOL 3 MG MT LOZG: 1.0000 | LOZENGE | OROMUCOSAL | Status: DC | PRN

## 2021-03-30 MED ORDER — ACETAMINOPHEN 325 MG PO TABS
325.0000 mg | ORAL_TABLET | Freq: Four times a day (QID) | ORAL | Status: DC | PRN
  Administered 2021-03-31 – 2021-04-02 (×4): 650 mg via ORAL
  Filled 2021-03-30 (×4): qty 2

## 2021-03-30 MED ORDER — LEVOTHYROXINE SODIUM 50 MCG PO TABS: 50.0000 ug | ORAL_TABLET | ORAL | Status: DC

## 2021-03-30 MED ORDER — MIDAZOLAM HCL 5 MG/5ML IJ SOLN
INTRAMUSCULAR | Status: DC | PRN
  Administered 2021-03-30: .5 mg via INTRAVENOUS

## 2021-03-30 MED ORDER — FENTANYL CITRATE (PF) 100 MCG/2ML IJ SOLN
INTRAMUSCULAR | Status: DC | PRN
  Administered 2021-03-30 (×2): 25 ug via INTRAVENOUS
  Administered 2021-03-30: 50 ug via INTRAVENOUS

## 2021-03-30 MED ORDER — 0.9 % SODIUM CHLORIDE (POUR BTL) OPTIME
TOPICAL | Status: DC | PRN
  Administered 2021-03-30: 1000 mL

## 2021-03-30 MED ORDER — CYCLOSPORINE 0.05 % OP EMUL
1.0000 [drp] | Freq: Two times a day (BID) | OPHTHALMIC | Status: DC
  Administered 2021-03-30 – 2021-04-02 (×6): 1 [drp] via OPHTHALMIC
  Filled 2021-03-30 (×6): qty 1

## 2021-03-30 MED ORDER — METOCLOPRAMIDE HCL 5 MG PO TABS: 5.0000 mg | ORAL_TABLET | Freq: Three times a day (TID) | ORAL | Status: DC | PRN

## 2021-03-30 MED ORDER — OXYCODONE HCL 5 MG PO TABS
10.0000 mg | ORAL_TABLET | ORAL | Status: DC | PRN
  Administered 2021-03-31: 10 mg via ORAL
  Filled 2021-03-30: qty 2

## 2021-03-30 MED ORDER — ENSURE ENLIVE PO LIQD
237.0000 mL | Freq: Two times a day (BID) | ORAL | Status: DC
  Administered 2021-04-01 – 2021-04-02 (×2): 237 mL via ORAL

## 2021-03-30 MED ORDER — ONDANSETRON HCL 4 MG PO TABS
4.0000 mg | ORAL_TABLET | Freq: Four times a day (QID) | ORAL | Status: DC | PRN
  Administered 2021-03-31 – 2021-04-01 (×2): 4 mg via ORAL
  Filled 2021-03-30 (×3): qty 1

## 2021-03-30 SURGICAL SUPPLY — 47 items
BAG DECANTER FOR FLEXI CONT (MISCELLANEOUS) ×3 IMPLANT
BLADE SAW SGTL 18X1.27X75 (BLADE) ×2 IMPLANT
BLADE SURG SZ10 CARB STEEL (BLADE) ×4 IMPLANT
CNTNR URN SCR LID CUP LEK RST (MISCELLANEOUS) ×1 IMPLANT
CONT SPEC 4OZ STRL OR WHT (MISCELLANEOUS) ×2
COVER PERINEAL POST (MISCELLANEOUS) ×2 IMPLANT
COVER SURGICAL LIGHT HANDLE (MISCELLANEOUS) ×2 IMPLANT
COVER WAND RF STERILE (DRAPES) ×2 IMPLANT
CUP ACETBLR 48 OD 100 SERIES (Hips) ×1 IMPLANT
DECANTER SPIKE VIAL GLASS SM (MISCELLANEOUS) ×4 IMPLANT
DRAPE ORTHO SPLIT 77X108 STRL (DRAPES)
DRAPE STERI IOBAN 125X83 (DRAPES) ×2 IMPLANT
DRAPE SURG ORHT 6 SPLT 77X108 (DRAPES) IMPLANT
DRAPE U-SHAPE 47X51 STRL (DRAPES) ×4 IMPLANT
DRSG AQUACEL AG ADV 3.5X10 (GAUZE/BANDAGES/DRESSINGS) ×2 IMPLANT
DURAPREP 26ML APPLICATOR (WOUND CARE) ×2 IMPLANT
ELECT BLADE TIP CTD 4 INCH (ELECTRODE) ×2 IMPLANT
ELECT REM PT RETURN 15FT ADLT (MISCELLANEOUS) ×2 IMPLANT
ELIMINATOR HOLE APEX DEPUY (Hips) ×1 IMPLANT
GLOVE SRG 8 PF TXTR STRL LF DI (GLOVE) ×1 IMPLANT
GLOVE SURG ENC MOIS LTX SZ7.5 (GLOVE) ×2 IMPLANT
GLOVE SURG ENC MOIS LTX SZ8.5 (GLOVE) ×2 IMPLANT
GLOVE SURG UNDER POLY LF SZ8 (GLOVE) ×2
GLOVE SURG UNDER POLY LF SZ9 (GLOVE) ×2 IMPLANT
GOWN STRL REUS W/TWL XL LVL3 (GOWN DISPOSABLE) ×4 IMPLANT
HEAD FEM STD 32X+1 STRL (Hips) ×1 IMPLANT
HOLDER FOLEY CATH W/STRAP (MISCELLANEOUS) ×2 IMPLANT
KIT TURNOVER KIT A (KITS) ×2 IMPLANT
MANIFOLD NEPTUNE II (INSTRUMENTS) ×2 IMPLANT
NDL HYPO 21X1.5 SAFETY (NEEDLE) ×2 IMPLANT
NEEDLE HYPO 21X1.5 SAFETY (NEEDLE) ×4 IMPLANT
NS IRRIG 1000ML POUR BTL (IV SOLUTION) ×2 IMPLANT
PACK ANTERIOR HIP CUSTOM (KITS) ×2 IMPLANT
PENCIL SMOKE EVACUATOR (MISCELLANEOUS) ×1 IMPLANT
PINN ALTRX NEUT ID X OD 32X48 ×1 IMPLANT
STEM FEM SZ3 STD ACTIS (Stem) ×1 IMPLANT
SUT ETHIBOND NAB CT1 #1 30IN (SUTURE) ×2 IMPLANT
SUT VIC AB 0 CT1 27 (SUTURE)
SUT VIC AB 0 CT1 27XBRD ANBCTR (SUTURE) IMPLANT
SUT VIC AB 1 CTX 36 (SUTURE) ×2
SUT VIC AB 1 CTX36XBRD ANBCTR (SUTURE) ×1 IMPLANT
SUT VIC AB 2-0 CT1 27 (SUTURE)
SUT VIC AB 2-0 CT1 TAPERPNT 27 (SUTURE) IMPLANT
SUT VIC AB 3-0 CT1 27 (SUTURE) ×2
SUT VIC AB 3-0 CT1 TAPERPNT 27 (SUTURE) ×1 IMPLANT
SYR CONTROL 10ML LL (SYRINGE) ×6 IMPLANT
TRAY FOLEY MTR SLVR 16FR STAT (SET/KITS/TRAYS/PACK) ×1 IMPLANT

## 2021-03-30 NOTE — Anesthesia Postprocedure Evaluation (Signed)
Anesthesia Post Note  Patient: Christina Sellers  Procedure(Sellers) Performed: RIGHTTOTAL HIP ARTHROPLASTY ANTERIOR APPROACH (Right Hip)     Patient location during evaluation: PACU Anesthesia Type: Spinal Level of consciousness: oriented and awake and alert Pain management: pain level controlled Vital Signs Assessment: post-procedure vital signs reviewed and stable Respiratory status: spontaneous breathing, respiratory function stable and patient connected to nasal cannula oxygen Cardiovascular status: blood pressure returned to baseline and stable Postop Assessment: no headache, no backache and no apparent nausea or vomiting Anesthetic complications: no   No complications documented.  Last Vitals:  Vitals:   03/30/21 1730 03/30/21 1745  BP: (!) 120/96 (!) 121/57  Pulse: 92 82  Resp: 16 18  Temp:    SpO2: 100% 98%    Last Pain:  Vitals:   03/30/21 1745  TempSrc:   PainSc: 3                  Christina Sellers

## 2021-03-30 NOTE — Op Note (Signed)
PATIENT ID:      Christina Sellers  MRN:     [id number] DOB/AGE:    1941/01/18 / 80 y.o.  OPERATIVE REPORT   DATE OF PROCEDURE:  03/30/2021      PREOPERATIVE DIAGNOSIS:  RIGHT HIP OSTEOARTHRITIS AND AVASCULAR NECROSIS                                                         POSTOPERATIVE DIAGNOSIS:  Same                                                         PROCEDURE: Anterior R total hip arthroplasty using a 48 mm DePuy Pinnacle  Cup, [employer], 0-degree polyethylene liner, a +1 mm x 12mm metal head, a 2 std Depuy Actis stem  SURGEON: Kerin Salen  ASSISTANT:   Kerry Hough. [employer]  (present throughout entire procedure and necessary for timely completion of the procedure)   ANESTHESIA: Spinal, Exparel 133mg  injection BLOOD LOSS: 300 cc FLUID REPLACEMENT: 1600 cc crystalloid TRANEXAMIC ACID: 1gm IV, 2gm Topical COMPLICATIONS: none    INDICATIONS FOR PROCEDURE: A 80 y.o. year-old With  [place]   for 2 years, x-rays show bone-on-bone arthritic changes, and osteophytes. Despite conservative measures with observation, anti-inflammatory medicine, narcotics, use of a cane, has severe unremitting pain and can ambulate only a few blocks before resting. Patient desires elective R total hip arthroplasty to decrease pain and increase function. The risks, benefits, and alternatives were discussed at length including but not limited to the risks of infection, bleeding, nerve injury, stiffness, blood clots, the need for revision surgery, cardiopulmonary complications, among others, and they were willing to proceed. Questions answered      PROCEDURE IN DETAIL: The patient was identified by armband,   received preoperative IV antibiotics in the holding area at [facility], taken to the operating room , appropriate anesthetic monitors   were attached and anesthesia was induced with the patient on the gurney. HANA boots were applied to the feet,  and the patient  was transferred to the HANA table with a peroneal post and support underneath the non-operative leg. Theoperative lower extremity was then prepped and draped in the usual sterile fashion from just above the iliac crest to the knee. And a timeout procedure was performed. Kerry Hough. Hardin Negus [facility] was present and scrubbed throughout the case, critical for assistance with, positioning, exposure, retraction, instrumentation, and closure.Skin along incision area was injected with 10 cc of Exparel solution. We then made a 12 cm incision along the interval at the leading edge of the tensor fascia lata of starting at 2 cm lateral to the ASIS. Small bleeders in the skin and subcutaneous tissue identified and cauterized we dissected down to the fascia and made an incision in the fascia allowing [place] to elevate the fascia of the tensor muscle and exploited the interval between the rectus and the tensor fascia lata. A Cobra retractor was then placed along the superior neck of the femur. A cerebellar retractor was used to expose the interval between the tensor fascia lata and the  rectus femoris.  We identified and cauterized the ascending branch of the anterior circumflex artery. A second Cobra retractor along the inferior neck of the femur. A small Hohmann retractor was placed underneath the origin of the rectus femoris, giving [place] good medial exposure. Using Ronguers fatty tissue was removed from in front of the anterior capsule. The capsule was then incised, starting out at the superior anterior rim of the acetabulum going laterally along the anterior neck. The capsule was then teed along the neck superiorly and inferiorly. Electrocautery was used to release capsule from the anterior and medial neck of the femur to allow external rotation. Cobra retractors were then placed along the inferior and superior neck allowing [place] to perform a standard neck cut and removed the femoral head with a power corkscrew. We then  placed a medium bent homan retractor in the cotyloid notch and posteriorly along the acetabular rim a narrow Cobra retractor. Exposed labral tissue and osteophytes were then removed. We then sequentially reamed up to a 47 mm basket reamer obtaining good coverage in all quadrants, verified by C-arm imaging. Under C-arm control we then hammered into place a 48 mm Pinnacle cup in 45 of abduction and 15 of anteversion. The cup seated nicely and required no supplemental screws. We then placed a central hole Eliminator and a 0 polyethylene liner. The foot was then externally rotated to 130-140. The limb was extended and adducted to the floor, delivering the proximal femur up into the wound. A medium curved Hohmann retractor was placed over the greater trochanter and a long Homan retractor along the posterior femoral neck completing the exposure and lateralizing the femur. We then performed releases superiorly and and inferiorly of the capsule going back to the pirformis fossa superiorly and to the lesser trochanter inferiorly. We then entered the proximal femur with the box cutting offset chisel followed by, a canal sounder, the chili pepper and broaching up to a 2std broach. This seated nicely and we reamed the calcar. A trial reduction was performed with a 1 mm X 32 mm head.The limb lengths were excellent the hip was stable in 90 of external rotation. At this point the trial components removed and we hammered into place a # 2 std  Offset Actis stem with Gryption coating. A + 1 mm x 14mm head was then hammered into place. The hip was reduced and final C-arm images obtained. The wound was thoroughly irrigated with normal saline solution. We repaired the ant capsule and the tensor fascia lot a with running 0 vicryl suture. the subcutaneous tissue was closed with 2-0 and 3-0 Vicryl suture followed by an Aquacil dressing. At this point the patient was awaken and transferred to hospital gurney without difficulty.    Kerin Salen 03/30/2021, 8:37 PM

## 2021-03-30 NOTE — Op Note (Deleted)
PATIENT ID:      Christina Sellers  MRN:     [id number] DOB/AGE:    1941/07/26 / 80 y.o.  OPERATIVE REPORT   DATE OF PROCEDURE:  03/30/2021      PREOPERATIVE DIAGNOSIS:  RIGHT HIP OSTEOARTHRITIS AND AVASCULAR NECROSIS                                                         POSTOPERATIVE DIAGNOSIS:  Same                                                         PROCEDURE: Anterior L total hip arthroplasty using a 52 mm DePuy Pinnacle  Cup, [employer], 0-degree polyethylene liner, a +1.5 mm x 80mm ceramic head, a 9 hi Depuy Actis stem  SURGEON: Kerin Salen  ASSISTANT:   Kerry Hough. [employer]  (present throughout entire procedure and necessary for timely completion of the procedure)   ANESTHESIA: Spinal, Exparel 133mg  injection BLOOD LOSS: 400 cc FLUID REPLACEMENT: 1600 cc crystalloid TRANEXAMIC ACID: 1gm IV, 2gm Topical COMPLICATIONS: none    INDICATIONS FOR PROCEDURE: A 80 y.o. year-old With  [place]   for 3 years, x-rays show collapse of femoral headarthritic changes, and osteophytes. Despite conservative measures with observation, anti-inflammatory medicine, narcotics, use of a cane, has severe unremitting pain and can ambulate only a few blocks before resting. Patient desires elective R total hip arthroplasty to decrease pain and increase function. The risks, benefits, and alternatives were discussed at length including but not limited to the risks of infection, bleeding, nerve injury, stiffness, blood clots, the need for revision surgery, cardiopulmonary complications, among others, and they were willing to proceed. Questions answered      PROCEDURE IN DETAIL: The patient was identified by armband,   received preoperative IV antibiotics in the holding area at [facility], taken to the operating room , appropriate anesthetic monitors   were attached and anesthesia was induced with the patient on the gurney. HANA boots were applied  to the feet, and the patient  was transferred to the HANA table with a peroneal post and support underneath the non-operative leg. Theoperative lower extremity was then prepped and draped in the usual sterile fashion from just above the iliac crest to the knee. And a timeout procedure was performed. Kerry Hough. Hardin Negus [facility] was present and scrubbed throughout the case, critical for assistance with, positioning, exposure, retraction, instrumentation, and closure.Skin along incision area was injected with 10 cc of Exparel solution. We then made a 13 cm incision along the interval at the leading edge of the tensor fascia lata of starting at 2 cm lateral to the ASIS. Small bleeders in the skin and subcutaneous tissue identified and cauterized we dissected down to the fascia and made an incision in the fascia allowing [place] to elevate the fascia of the tensor muscle and exploited the interval between the rectus and the tensor fascia lata. A Cobra retractor was then placed along the superior neck of the femur. A cerebellar retractor was used to expose the interval between the tensor fascia lata  and the rectus femoris.  We identified and cauterized the ascending branch of the anterior circumflex artery. A second Cobra retractor along the inferior neck of the femur. A small Hohmann retractor was placed underneath the origin of the rectus femoris, giving [place] good medial exposure. Using Ronguers fatty tissue was removed from in front of the anterior capsule. The capsule was then incised, starting out at the superior anterior rim of the acetabulum going laterally along the anterior neck. The capsule was then teed along the neck superiorly and inferiorly. Electrocautery was used to release capsule from the anterior and medial neck of the femur to allow external rotation. Cobra retractors were then placed along the inferior and superior neck allowing [place] to perform a standard neck cut and removed the femoral head with a power corkscrew.  We then placed a medium bent homan retractor in the cotyloid notch and posteriorly along the acetabular rim a narrow Cobra retractor. Exposed labral tissue and osteophytes were then removed. We then sequentially reamed up to a 51 mm basket reamer obtaining good coverage in all quadrants, verified by C-arm imaging. Under C-arm control we then hammered into place a 52 mm Pinnacle cup in 45 of abduction and 15 of anteversion. The cup seated nicely and required no supplemental screws. We then placed a central hole Eliminator and a 0 polyethylene liner. The foot was then externally rotated to 130-140. The limb was extended and adducted to the floor, delivering the proximal femur up into the wound. A medium curved Hohmann retractor was placed over the greater trochanter and a long Homan retractor along the posterior femoral neck completing the exposure and lateralizing the femur. We then performed releases superiorly and and inferiorly of the capsule going back to the pirformis fossa superiorly and to the lesser trochanter inferiorly. We then entered the proximal femur with the box cutting offset chisel followed by, a canal sounder, the chili pepper and broaching up to a 9 broach. This seated nicely and we reamed the calcar. A trial reduction was performed with a 1.5 mm X 36 mm head.The limb lengths were excellent the hip was stable in 90 of external rotation. At this point the trial components removed and we hammered into place a # 9 hi  Offset Actis stem with Gryption coating. A + 1.5 mm x 36 ceramic head was then hammered into place. The hip was reduced and final C-arm images obtained. The wound was thoroughly irrigated with normal saline solution. We repaired the ant capsule and the tensor fascia lot a with running 0 vicryl suture. the subcutaneous tissue was closed with 2-0 and 3-0 Vicryl suture followed by an Aquacil dressing. At this point the patient was awaken and transferred to hospital gurney without  difficulty.   Kerin Salen 03/30/2021, 1:54 PM

## 2021-03-30 NOTE — Transfer of Care (Signed)
Immediate Anesthesia Transfer of Care Note  Patient: Christina Sellers  Procedure(s) Performed: RIGHTTOTAL HIP ARTHROPLASTY ANTERIOR APPROACH (Right Hip)  Patient Location: PACU  Anesthesia Type:Spinal  Level of Consciousness: awake, alert , oriented and patient cooperative  Airway & Oxygen Therapy: Patient Spontanous Breathing and Patient connected to face mask oxygen  Post-op Assessment: Report given to RN and Post -op Vital signs reviewed and stable  Post vital signs: stable  Last Vitals:  Vitals Value Taken Time  BP 98/66 03/30/21 1645  Temp    Pulse 89 03/30/21 1653  Resp 13 03/30/21 1653  SpO2 100 % 03/30/21 1653  Vitals shown include unvalidated device data.  Last Pain:  Vitals:   03/30/21 1331  TempSrc: Oral  PainSc: 6          Complications: No complications documented.

## 2021-03-30 NOTE — Anesthesia Procedure Notes (Signed)
Procedure Name: MAC Date/Time: 03/30/2021 3:08 PM Performed by: Lissa Morales, CRNA Pre-anesthesia Checklist: Patient identified, Emergency Drugs available, Suction available and Patient being monitored Patient Re-evaluated:Patient Re-evaluated prior to induction Oxygen Delivery Method: Simple face mask Placement Confirmation: positive ETCO2

## 2021-03-30 NOTE — Discharge Instructions (Signed)

## 2021-03-30 NOTE — Anesthesia Procedure Notes (Addendum)
Spinal  Patient location during procedure: OR Start time: 03/30/2021 3:10 PM End time: 03/30/2021 3:15 PM Reason for block: surgical anesthesia Staffing Performed: anesthesiologist  Anesthesiologist: Myrtie Soman, MD Preanesthetic Checklist Completed: patient identified, IV checked, site marked, risks and benefits discussed, surgical consent, monitors and equipment checked, pre-op evaluation and timeout performed Spinal Block Patient position: sitting Prep: Betadine Patient monitoring: heart rate, continuous pulse ox and blood pressure Approach: midline Location: L3-4 Injection technique: single-shot Needle Needle type: Sprotte  Needle gauge: 24 G Needle length: 9 cm Assessment Sensory level: T6 Events: CSF return Additional Notes Expiration date of kit checked and confirmed. Patient tolerated procedure well, without complications.

## 2021-03-31 ENCOUNTER — Encounter (HOSPITAL_COMMUNITY): Payer: Self-pay | Admitting: Orthopedic Surgery

## 2021-03-31 DIAGNOSIS — I110 Hypertensive heart disease with heart failure: Secondary | ICD-10-CM | POA: Diagnosis not present

## 2021-03-31 DIAGNOSIS — E039 Hypothyroidism, unspecified: Secondary | ICD-10-CM | POA: Diagnosis not present

## 2021-03-31 DIAGNOSIS — M1611 Unilateral primary osteoarthritis, right hip: Secondary | ICD-10-CM | POA: Diagnosis not present

## 2021-03-31 DIAGNOSIS — E1165 Type 2 diabetes mellitus with hyperglycemia: Secondary | ICD-10-CM | POA: Diagnosis not present

## 2021-03-31 DIAGNOSIS — Z87891 Personal history of nicotine dependence: Secondary | ICD-10-CM | POA: Diagnosis not present

## 2021-03-31 DIAGNOSIS — I5032 Chronic diastolic (congestive) heart failure: Secondary | ICD-10-CM | POA: Diagnosis not present

## 2021-03-31 DIAGNOSIS — Z79899 Other long term (current) drug therapy: Secondary | ICD-10-CM | POA: Diagnosis not present

## 2021-03-31 LAB — CBC
HCT: 29.1 % — ABNORMAL LOW (ref 36.0–46.0)
Hemoglobin: 9.3 g/dL — ABNORMAL LOW (ref 12.0–15.0)
MCH: 33.1 pg (ref 26.0–34.0)
MCHC: 32 g/dL (ref 30.0–36.0)
MCV: 103.6 fL — ABNORMAL HIGH (ref 80.0–100.0)
Platelets: 162 10*3/uL (ref 150–400)
RBC: 2.81 MIL/uL — ABNORMAL LOW (ref 3.87–5.11)
RDW: 13.3 % (ref 11.5–15.5)
WBC: 12.8 10*3/uL — ABNORMAL HIGH (ref 4.0–10.5)
nRBC: 0 % (ref 0.0–0.2)

## 2021-03-31 LAB — BASIC METABOLIC PANEL
Anion gap: 5 (ref 5–15)
BUN: 14 mg/dL (ref 8–23)
CO2: 26 mmol/L (ref 22–32)
Calcium: 8.3 mg/dL — ABNORMAL LOW (ref 8.9–10.3)
Chloride: 105 mmol/L (ref 98–111)
Creatinine, Ser: 0.82 mg/dL (ref 0.44–1.00)
GFR, Estimated: >60 mL/min (ref >60)
Glucose, Bld: 214 mg/dL — ABNORMAL HIGH (ref 70–99)
Potassium: 4.2 mmol/L (ref 3.5–5.1)
Sodium: 136 mmol/L (ref 135–145)

## 2021-03-31 NOTE — TOC Transition Note (Signed)
Transition of Care [facility]) - CM/SW Discharge Note   Patient Details  Name: Christina Sellers MRN: [id number] Date of Birth: 1941-01-25  [facility]) CM/SW Contact:  Lennart Pall, LCSW Phone Number: 03/31/2021, 11:33 AM   Clinical Narrative:    Met briefly with pt and son and confirming she has all needed DME.  Plan for OPPT via [place].  No further TOC needs.    Final next level of care: OP Rehab Barriers to Discharge: No Barriers Identified   Patient Goals and CMS Choice Patient states their goals for this hospitalization and ongoing recovery are:: return home      Discharge Placement                       Discharge Plan and Services                DME Arranged: N/A DME Agency: NA                  Social Determinants of Health (SDOH) Interventions     Readmission Risk Interventions No flowsheet data found.

## 2021-03-31 NOTE — Progress Notes (Addendum)
PATIENT ID: Christina Sellers  MRN: [id number]  DOB/AGE:  05/01/1941 / 80 y.o.  1 Day Post-Op Procedure(s) (LRB): RIGHTTOTAL HIP ARTHROPLASTY ANTERIOR APPROACH (Right)    PROGRESS NOTE Subjective: Patient is alert, oriented, 1x Nausea, no Vomiting, yes passing gas, . Taking PO well. Denies SOB, Chest or Calf Pain. Using Incentive Spirometer, PAS in place. Ambulate WBAT Patient reports pain as  5/10  .    Objective: Vital signs in last 24 hours: Vitals:   03/30/21 2046 03/30/21 2046 03/31/21 0130 03/31/21 0630  BP: (Abnormal) 182/58 (Abnormal) 182/58 (Abnormal) 153/60 (Abnormal) 132/57  Pulse: 95 95 (Abnormal) 102 85  Resp: 16 16 16 16   Temp: 98.5 F (36.9 C) 98.5 F (36.9 C) 98.9 F (37.2 C) 97.9 F (36.6 C)  TempSrc:   Oral Oral  SpO2: (Abnormal) 89% (Abnormal) 89% 99% 100%  Weight:      Height:          Intake/Output from previous day: I/O last 3 completed shifts: In: 2726.8 [P.O.:270; I.V.:2106.8; IV Piggyback:350] Out: 1175 [Urine:975; Blood:200]   Intake/Output this shift: Total I/O In: -  Out: 100 [Urine:100]   LABORATORY DATA: Recent Labs    03/30/21 1732 03/31/21 0324  WBC  --  12.8*  HGB  --  9.3*  HCT  --  29.1*  PLT  --  162  NA  --  136  K  --  4.2  CL  --  105  CO2  --  26  BUN  --  14  CREATININE  --  0.82  GLUCOSE  --  214*  GLUCAP 95  --   CALCIUM  --  8.3*    Examination: Neurologically intact ABD soft Neurovascular intact Sensation intact distally Intact pulses distally Dorsiflexion/Plantar flexion intact Incision: dressing C/D/I No cellulitis present Compartment soft} XR AP&Lat of hip shows well placed\fixed THA  Assessment:   1 Day Post-Op Procedure(s) (LRB): RIGHTTOTAL HIP ARTHROPLASTY ANTERIOR APPROACH (Right) ADDITIONAL DIAGNOSIS:  Expected Acute Blood Loss Anemia, hypothryroid, depression, HTN, COPD, orthostatic hypotension  Patient's anticipated LOS is less than 2 midnights, meeting these requirements: - Younger than  43 - Lives within 1 hour of care - Has a competent adult at home to recover with post-op recover - NO history of  - Chronic pain requiring opiods  - Diabetes  - Coronary Artery Disease  - Heart failure  - Heart attack  - Stroke  - DVT/VTE  - Cardiac arrhythmia  - Respiratory Failure/COPD  - Renal failure  - Anemia  - Advanced Liver disease       Plan: PT/OT WBAT, THA  DVT Prophylaxis: SCDx72 hrs, ASA 81 mg BID x 2 weeks  DISCHARGE PLAN: Home, if passes PT today, patient reluctant to move, encouraged her to try, reassured that procedure went well, family in attendance. May require additional day in hospital  DISCHARGE NEEDS: HHPT, Walker and 3-in-1 comode seatPatient ID: Christina Sellers, female   DOB: 1941-07-15, 80 y.o.   MRN: [id number]

## 2021-03-31 NOTE — Progress Notes (Signed)
Physical Therapy Treatment Patient Details Name: Christina Sellers MRN: [id number] DOB: 04-16-1941 Today's Date: 03/31/2021    History of Present Illness 80 y.o. female admitted 03/30/21 for R AA-THA. PMH includes, orthostatic hypotension, CHF, HTN, IBS, gout, DM. Pt had recent admission 02/11/21 with syncope with PEA arrest.    PT Comments    Mod assist for supine to sit, pt sat edge of bed for ~10 minutes, she reported dizziness while sitting and was quite lethargic to the point of falling asleep while sitting edge of bed. Total assist for sit to supine. Performed RLE exercises with assistance. SaO2 87% on room air, 99% on 2L O2. BP 143/55 sitting. Progress limited by lethargy. Will follow.   Follow Up Recommendations  Follow surgeon's recommendation for DC plan and follow-up therapies     Equipment Recommendations  None recommended by PT    Recommendations for Other Services       Precautions / Restrictions Precautions Precautions: Fall Precaution Comments: 3 falls in past 6 months (pt had hospitalization in March 2* syncope, per son her mediations were adjusted and syncopal events have resolved) Restrictions Weight Bearing Restrictions: No Other Position/Activity Restrictions: WBAT    Mobility  Bed Mobility Overal bed mobility: Needs Assistance Bed Mobility: Supine to Sit;Sit to Supine     Supine to sit: Mod assist Sit to supine: Total assist   General bed mobility comments: assist to raise trunk, pt self assisted RLE with gait belt looped on R foot; Total assist for sit to supine as pt was lethargic    Transfers Overall transfer level: Needs assistance Equipment used: None Transfers: Lateral/Scoot Transfers          Lateral/Scoot Transfers: Min assist General transfer comment: lateral scoot towards HOB x 3; did not attempt to stand as pt was dizzy and lethargic to the point of falling asleep while sitting at the edge of the bed. BP 143/55 sitting, SaO2 87% on  room air, applied 2L O2 and SaO2 came up to 99%. Pt sat edge of bed x 10 minutes with dizziness not resolving with time  Ambulation/Gait             General Gait Details: deferred, pt lethargic, falling asleep while sitting at edge of bed   Stairs             Wheelchair Mobility    Modified Rankin (Stroke Patients Only)       Balance Overall balance assessment: Needs assistance Sitting-balance support: Bilateral upper extremity supported;Feet supported Sitting balance-Leahy Scale: Poor Sitting balance - Comments: posterior lean intermittently due to pt lethargy Postural control: Posterior lean                                  Cognition Arousal/Alertness: Lethargic;Suspect due to medications Behavior During Therapy: [facility] for tasks assessed/performed Overall Cognitive Status: Within Functional Limits for tasks assessed                                        Exercises Total Joint Exercises Ankle Circles/Pumps: AROM;Both;10 reps;Supine Heel Slides: AAROM;Right;10 reps;Supine Hip ABduction/ADduction: AAROM;Right;10 reps;Supine Long Arc Quad: AROM;Right;5 reps;Seated    General Comments        Pertinent Vitals/Pain Pain Assessment: Faces Faces Pain Scale: Hurts little more Pain Location: R hip with movement Pain Descriptors / Indicators: Guarding;Grimacing Pain Intervention(s):  Limited activity within patient's tolerance;Monitored during session;Premedicated before session;Repositioned;Ice applied    Home Living Family/patient expects to be discharged to:: Private residence Living Arrangements: Alone Available Help at Discharge: Family;Available 24 hours/day Type of Home: House Home Access: Stairs to enter Entrance Stairs-Rails: Left Home Layout: One level Home Equipment: Walker - 2 wheels;Shower seat Additional Comments: daughter to stay with pt a couple days (she's from [place]), son is local but works, pt has housekeeper     Prior Function        Comments: Uses rolling walker due to arthritic rt hip, independent bathing/dressing   PT Goals (current goals can now be found in the care plan section) Acute Rehab PT Goals Patient Stated Goal: walk, decrease pain PT Goal Formulation: With patient/family Time For Goal Achievement: 04/07/21 Potential to Achieve Goals: Good Progress towards PT goals: Progressing toward goals    Frequency    7X/week      PT Plan Current plan remains appropriate    Co-evaluation              AM-PAC PT "6 Clicks" Mobility   Outcome Measure  Help needed turning from your back to your side while in a flat bed without using bedrails?: A Lot Help needed moving from lying on your back to sitting on the side of a flat bed without using bedrails?: A Lot Help needed moving to and from a bed to a chair (including a wheelchair)?: A Lot Help needed standing up from a chair using your arms (e.g., wheelchair or bedside chair)?: Total Help needed to walk in hospital room?: Total Help needed climbing 3-5 steps with a railing? : Total 6 Click Score: 9    End of Session   Activity Tolerance: Patient limited by fatigue;Patient limited by lethargy Patient left: in bed;with call bell/phone within reach;with family/visitor present Nurse Communication: Mobility status;Other (comment) (dizziness, lethargy, O2 sats low on RA) PT Visit Diagnosis: Difficulty in walking, not elsewhere classified (R26.2);Pain Pain - Right/Left: Right Pain - part of body: Hip     Time: 1313-1340 PT Time Calculation (min) (ACUTE ONLY): 27 min  Charges:  $Therapeutic Exercise: 8-22 mins $Therapeutic Activity: 8-22 mins                     Blondell Reveal Kistler PT 03/31/2021  Acute Rehabilitation Services Pager [phone] Office [phone]

## 2021-03-31 NOTE — Evaluation (Signed)
Physical Therapy Evaluation Patient Details Name: Christina Sellers MRN: [id number] DOB: May 29, 1941 Today's Date: 03/31/2021   History of Present Illness  80 y.o. female admitted 03/30/21 for R AA-THA. PMH includes, orthostatic hypotension, CHF, HTN, IBS, gout, DM. Pt had recent admission 02/11/21 with syncope with PEA arrest.  Clinical Impression  Pt is s/p THA resulting in the deficits listed below (see PT Problem List). Pt requested pain medication prior to attempting getting out of bed. RN notified of request. Initiated instruction in [place]. Will assess mobility during afternoon session today.  Pt will benefit from skilled PT to increase their independence and safety with mobility to allow discharge to the venue listed below.      Follow Up Recommendations Follow surgeon's recommendation for DC plan and follow-up therapies    Equipment Recommendations  None recommended by PT    Recommendations for Other Services       Precautions / Restrictions Precautions Precautions: Fall Precaution Comments: 3 falls in past 6 months (pt had hospitalization in March 2* syncope, per son her mediations were adjusted and syncopal events have resolved) Restrictions Weight Bearing Restrictions: No      Mobility  Bed Mobility               General bed mobility comments: pt declined bed mobility/transfers, she'd like pain medication first. Notified RN of request for pain medicine. Will assess mobility next session.    Transfers                    Ambulation/Gait                Stairs            Wheelchair Mobility    Modified Rankin (Stroke Patients Only)       Balance                                             Pertinent Vitals/Pain Pain Assessment: Faces Faces Pain Scale: Hurts little more Pain Location: R hip with movement Pain Descriptors / Indicators: Guarding;Grimacing Pain Intervention(s): Limited activity within patient's  tolerance;Monitored during session;Premedicated before session;Repositioned;Ice applied;Patient requesting pain meds-RN notified    Home Living Family/patient expects to be discharged to:: Private residence Living Arrangements: Alone Available Help at Discharge: Family;Available 24 hours/day Type of Home: House Home Access: Stairs to enter Entrance Stairs-Rails: Left Entrance Stairs-Number of Steps: 3 Home Layout: One level Home Equipment: Walker - 2 wheels;Shower seat Additional Comments: daughter to stay with pt a couple days (she's from [place]), son is local but works, pt has housekeeper    Prior Function           Comments: Uses rolling walker due to arthritic rt hip, independent bathing/dressing     Hand Dominance        Extremity/Trunk Assessment   Upper Extremity Assessment Upper Extremity Assessment: Overall WFL for tasks assessed    Lower Extremity Assessment Lower Extremity Assessment: RLE deficits/detail RLE Deficits / Details: hip 2/5 limited by pain, hip AAROM decr 50% 2* pain RLE Sensation: WNL RLE Coordination: WNL       Communication   Communication: HOH  Cognition Arousal/Alertness: Lethargic Behavior During Therapy: WFL for tasks assessed/performed Overall Cognitive Status: Within Functional Limits for tasks assessed  General Comments      Exercises Total Joint Exercises Ankle Circles/Pumps: AROM;Both;10 reps;Supine Heel Slides: AAROM;Right;10 reps;Supine Hip ABduction/ADduction: AAROM;Right;10 reps;Supine   Assessment/Plan    PT Assessment Patient needs continued PT services  PT Problem List Decreased activity tolerance;Decreased mobility;Pain;Decreased strength;Decreased range of motion       PT Treatment Interventions Therapeutic activities;Therapeutic exercise;Gait training;Patient/family education    PT Goals (Current goals can be found in the Care Plan section)  Acute  Rehab PT Goals Patient Stated Goal: walk, decrease pain PT Goal Formulation: With patient/family Time For Goal Achievement: 04/07/21 Potential to Achieve Goals: Good    Frequency 7X/week   Barriers to discharge        Co-evaluation               AM-PAC PT "6 Clicks" Mobility  Outcome Measure Help needed turning from your back to your side while in a flat bed without using bedrails?: A Lot Help needed moving from lying on your back to sitting on the side of a flat bed without using bedrails?: A Lot Help needed moving to and from a bed to a chair (including a wheelchair)?: A Lot Help needed standing up from a chair using your arms (e.g., wheelchair or bedside chair)?: A Lot Help needed to walk in hospital room?: A Lot Help needed climbing 3-5 steps with a railing? : A Lot 6 Click Score: 12    End of Session   Activity Tolerance: Patient limited by pain Patient left: in bed;with call bell/phone within reach;with family/visitor present Nurse Communication: Mobility status;Patient requests pain meds PT Visit Diagnosis: Difficulty in walking, not elsewhere classified (R26.2);Pain Pain - Right/Left: Right Pain - part of body: Hip    Time: 5093-2671 PT Time Calculation (min) (ACUTE ONLY): 19 min   Charges:   PT Evaluation $PT Eval Moderate Complexity: 1 Mod          Philomena Doheny PT 03/31/2021  Acute Rehabilitation Services Pager [phone] Office [phone]

## 2021-04-01 DIAGNOSIS — Z79899 Other long term (current) drug therapy: Secondary | ICD-10-CM | POA: Diagnosis not present

## 2021-04-01 DIAGNOSIS — I110 Hypertensive heart disease with heart failure: Secondary | ICD-10-CM | POA: Diagnosis not present

## 2021-04-01 DIAGNOSIS — M1611 Unilateral primary osteoarthritis, right hip: Secondary | ICD-10-CM | POA: Diagnosis not present

## 2021-04-01 DIAGNOSIS — Z87891 Personal history of nicotine dependence: Secondary | ICD-10-CM | POA: Diagnosis not present

## 2021-04-01 DIAGNOSIS — E1165 Type 2 diabetes mellitus with hyperglycemia: Secondary | ICD-10-CM | POA: Diagnosis not present

## 2021-04-01 DIAGNOSIS — E039 Hypothyroidism, unspecified: Secondary | ICD-10-CM | POA: Diagnosis not present

## 2021-04-01 DIAGNOSIS — I5032 Chronic diastolic (congestive) heart failure: Secondary | ICD-10-CM | POA: Diagnosis not present

## 2021-04-01 LAB — CBC
HCT: 28.3 % — ABNORMAL LOW (ref 36.0–46.0)
Hemoglobin: 9.2 g/dL — ABNORMAL LOW (ref 12.0–15.0)
MCH: 33.6 pg (ref 26.0–34.0)
MCHC: 32.5 g/dL (ref 30.0–36.0)
MCV: 103.3 fL — ABNORMAL HIGH (ref 80.0–100.0)
Platelets: 141 10*3/uL — ABNORMAL LOW (ref 150–400)
RBC: 2.74 MIL/uL — ABNORMAL LOW (ref 3.87–5.11)
RDW: 13.4 % (ref 11.5–15.5)
WBC: 15.5 10*3/uL — ABNORMAL HIGH (ref 4.0–10.5)
nRBC: 0 % (ref 0.0–0.2)

## 2021-04-01 MED ORDER — TRAMADOL HCL 50 MG PO TABS
50.0000 mg | ORAL_TABLET | Freq: Four times a day (QID) | ORAL | Status: DC
  Administered 2021-04-01 – 2021-04-02 (×4): 50 mg via ORAL
  Filled 2021-04-01 (×4): qty 1

## 2021-04-01 NOTE — Progress Notes (Signed)
Physical Therapy Treatment Patient Details Name: Christina Sellers MRN: [id number] DOB: [birth year] Today's Date: 04/01/2021    History of Present Illness 80 y.o. female admitted 03/30/21 for R AA-THA. PMH includes, orthostatic hypotension, CHF, HTN, IBS, gout, DM. Pt had recent admission 02/11/21 with syncope with PEA arrest.    PT Comments    Pt is more alert today, though she does report feeling, "loopy from the medicine". Pt would like to hold off on narcotics and try just tylenol for pain control, RN notified. She ambulated 75' with RW, distance limited by pain and fatigue. Performed R THA HEP with min assist. She is progressing with mobility, will plan to see her again this afternoon and determine at that time if she is ready to DC home.    Follow Up Recommendations  Follow surgeon's recommendation for DC plan and follow-up therapies     Equipment Recommendations  None recommended by PT    Recommendations for Other Services       Precautions / Restrictions Precautions Precautions: Fall Precaution Comments: 3 falls in past 6 months (pt had hospitalization in March 2* syncope, per son her mediations were adjusted and syncopal events have resolved) Restrictions Weight Bearing Restrictions: No Other Position/Activity Restrictions: WBAT    Mobility  Bed Mobility               General bed mobility comments: sitting up on edge of bed    Transfers Overall transfer level: Needs assistance Equipment used: Rolling walker (2 wheeled) Transfers: Sit to/from Stand Sit to Stand: Mod assist;+2 physical assistance         General transfer comment: assist to power up, VCs hand placement  Ambulation/Gait Ambulation/Gait assistance: Min guard Gait Distance (Feet): 40 Feet Assistive device: Rolling walker (2 wheeled) Gait Pattern/deviations: Step-to pattern;Decreased step length - right;Decreased step length - left;Decreased weight shift to right Gait velocity: decr    General Gait Details: steady, no loss of balance, distance limited by pain/fatigue and mild dizziness. After being assisted to recliner, vital signs were: BP 127/49 sitting, HR 86, SaO2 97% on room air   Stairs             Wheelchair Mobility    Modified Rankin (Stroke Patients Only)       Balance Overall balance assessment: Needs assistance Sitting-balance support: Feet supported;No upper extremity supported Sitting balance-Leahy Scale: Good     Standing balance support: Bilateral upper extremity supported;During functional activity Standing balance-Leahy Scale: Poor Standing balance comment: relies on BUE pport with walking                            Cognition Arousal/Alertness: Awake/alert Behavior During Therapy: WFL for tasks assessed/performed Overall Cognitive Status: Within Functional Limits for tasks assessed                                        Exercises Total Joint Exercises Ankle Circles/Pumps: AROM;Both;10 reps;Supine Quad Sets: AROM;Right;5 reps;Supine Short Arc Quad: AROM;Right;10 reps;Supine Heel Slides: AAROM;Right;10 reps;Supine Hip ABduction/ADduction: AAROM;Right;10 reps;Supine    General Comments        Pertinent Vitals/Pain Pain Score: 7  Pain Location: R hip with movement Pain Descriptors / Indicators: Guarding;Grimacing Pain Intervention(s): Limited activity within patient's tolerance;Monitored during session;Ice applied;Repositioned;Patient requesting pain meds-RN notified (pt feels "loopy" from narcotics, wants to hold on them and try just tylenol,  RN notified)    Home Living                      Prior Function            PT Goals (current goals can now be found in the care plan section) Acute Rehab PT Goals Patient Stated Goal: walk, decrease pain PT Goal Formulation: With patient/family Time For Goal Achievement: 04/07/21 Potential to Achieve Goals: Good Progress towards PT goals:  Progressing toward goals    Frequency    7X/week      PT Plan Current plan remains appropriate    Co-evaluation              AM-PAC PT "6 Clicks" Mobility   Outcome Measure  Help needed turning from your back to your side while in a flat bed without using bedrails?: A Little Help needed moving from lying on your back to sitting on the side of a flat bed without using bedrails?: A Lot Help needed moving to and from a bed to a chair (including a wheelchair)?: A Lot Help needed standing up from a chair using your arms (e.g., wheelchair or bedside chair)?: A Lot Help needed to walk in hospital room?: A Little Help needed climbing 3-5 steps with a railing? : A Lot 6 Click Score: 14    End of Session Equipment Utilized During Treatment: Gait belt Activity Tolerance: Patient limited by fatigue;Patient tolerated treatment well;Patient limited by pain Patient left: with call bell/phone within reach;with family/visitor present;in chair;with chair alarm set Nurse Communication: Mobility status;Patient requests pain meds PT Visit Diagnosis: Difficulty in walking, not elsewhere classified (R26.2);Pain Pain - Right/Left: Right Pain - part of body: Hip     Time: 6073-7106 PT Time Calculation (min) (ACUTE ONLY): 41 min  Charges:  $Gait Training: 8-22 mins $Therapeutic Exercise: 8-22 mins $Therapeutic Activity: 8-22 mins                    Blondell Reveal Kistler PT 04/01/2021  Acute Rehabilitation Services Pager [phone] Office [phone]

## 2021-04-01 NOTE — Progress Notes (Addendum)
Physical Therapy Treatment Patient Details Name: Christina Sellers MRN: [id number] DOB: 07-05-1941 Today's Date: 04/01/2021    History of Present Illness 80 y.o. female admitted 03/30/21 for R AA-THA. PMH includes, orthostatic hypotension, CHF, HTN, IBS, gout, DM. Pt had recent admission 02/11/21 with syncope with PEA arrest.    PT Comments    Pt ambulated 43' with RW, distance limited by pain/fatigue/lightheadedness. Mod assist for sit to supine, min assist for sit to stand. Initiated stair training. Pt fatigues quickly and reports ongoing lightheadedness. She would benefit from one more night in the hospital.    Follow Up Recommendations  Follow surgeon's recommendation for DC plan and follow-up therapies     Equipment Recommendations  3 in 1   Recommendations for Other Services       Precautions / Restrictions Precautions Precautions: Fall Precaution Comments: 3 falls in past 6 months (pt had hospitalization in March 2* syncope, per son her mediations were adjusted and syncopal events have resolved) Restrictions Other Position/Activity Restrictions: WBAT    Mobility  Bed Mobility Overal bed mobility: Needs Assistance Bed Mobility: Sit to Supine       Sit to supine: Mod assist   General bed mobility comments: assist for LEs into bed    Transfers Overall transfer level: Needs assistance Equipment used: Rolling walker (2 wheeled) Transfers: Sit to/from Stand Sit to Stand: +2 physical assistance;Min assist         General transfer comment: assist to power up, VCs hand placement  Ambulation/Gait Ambulation/Gait assistance: Min guard Gait Distance (Feet): 80 Feet Assistive device: Rolling walker (2 wheeled) Gait Pattern/deviations: Step-to pattern;Decreased step length - right;Decreased step length - left;Decreased weight shift to right Gait velocity: decr   General Gait Details: steady, no loss of balance, distance limited by pain/fatigue.   Stairs Stairs:  Yes Stairs assistance: Min assist Stair Management: Two rails;Forwards;Step to pattern Number of Stairs: 3 General stair comments: pt she has handrail on one side and a door on other side to hold onto; VCs sequencing, min A to manage RW   Wheelchair Mobility    Modified Rankin (Stroke Patients Only)       Balance Overall balance assessment: Needs assistance Sitting-balance support: Feet supported;No upper extremity supported Sitting balance-Leahy Scale: Good     Standing balance support: Bilateral upper extremity supported;During functional activity Standing balance-Leahy Scale: Poor Standing balance comment: relies on BUE pport with walking                            Cognition Arousal/Alertness: Awake/alert Behavior During Therapy: WFL for tasks assessed/performed Overall Cognitive Status: Within Functional Limits for tasks assessed                                        Exercises      General Comments        Pertinent Vitals/Pain Pain Score: 7  Pain Location: R hip with movement Pain Descriptors / Indicators: Guarding;Grimacing Pain Intervention(s): Limited activity within patient's tolerance;Monitored during session;Premedicated before session;Ice applied;Repositioned    Home Living                      Prior Function            PT Goals (current goals can now be found in the care plan section) Acute Rehab PT Goals Patient Stated  Goal: walk, decrease pain PT Goal Formulation: With patient/family Time For Goal Achievement: 04/07/21 Potential to Achieve Goals: Good Progress towards PT goals: Progressing toward goals    Frequency    7X/week      PT Plan Current plan remains appropriate    Co-evaluation              AM-PAC PT "6 Clicks" Mobility   Outcome Measure  Help needed turning from your back to your side while in a flat bed without using bedrails?: A Little Help needed moving from lying on your  back to sitting on the side of a flat bed without using bedrails?: A Lot Help needed moving to and from a bed to a chair (including a wheelchair)?: A Lot Help needed standing up from a chair using your arms (e.g., wheelchair or bedside chair)?: A Little Help needed to walk in hospital room?: A Little Help needed climbing 3-5 steps with a railing? : A Little 6 Click Score: 16    End of Session Equipment Utilized During Treatment: Gait belt Activity Tolerance: Patient limited by fatigue;Patient limited by pain Patient left: with call bell/phone within reach;with family/visitor present;in bed Nurse Communication: Mobility status PT Visit Diagnosis: Difficulty in walking, not elsewhere classified (R26.2);Pain Pain - Right/Left: Right Pain - part of body: Hip     Time: 3825-0539 PT Time Calculation (min) (ACUTE ONLY): 30 min  Charges:  $Gait Training: 8-22 mins $Therapeutic Exercise: 8-22 mins                     Blondell Reveal Kistler PT 04/01/2021  Acute Rehabilitation Services Pager [phone] Office [phone]

## 2021-04-01 NOTE — Care Plan (Signed)
Spoke with MD and patient's son and have added HHPT. OPPT for today has been cancelled and rescheduled for 04/09/21.  Referral to [facility].  This will start after discharge.   Ladell Heads, [place]

## 2021-04-01 NOTE — Discharge Summary (Addendum)
Patient ID: Christina Sellers MRN: [id number] DOB/AGE: 82-May-1942 82 y.o.  Admit date: 03/30/2021 Discharge date: 04/02/2021 Admission Diagnoses:  Principal Problem:   Osteoarthritis of right hip Active Problems:   H/O total hip arthroplasty, right   Discharge Diagnoses:  Same  Past Medical History:  Diagnosis Date  . Abnormal Pap smear of vagina   . Allergic sinusitis   . Anxiety   . Atherosclerotic peripheral vascular disease ([place])   . Carotid artery stenosis   . Cellulitis   . COPD with asthma ([place])    not an issue now  . Depression   . Diabetes mellitus type 2, uncomplicated ([place])   . DJD (degenerative joint disease)   . Elevated cholesterol   . Elevated MCV    secondary to alchol  . GERD (gastroesophageal reflux disease)    not an issue now.  . Gout   . Hearing difficulty    bilateral hearing aids  . History of recurrent UTIs   . Hypertension    loss weight'off meds now"  . Hypothyroidism   . IBS (irritable bowel syndrome)   . Insomnia   . Labial fusion   . Osteoarthritis of left knee   . Osteopenia   . Recurrent vaginitis   . Sjogren's syndrome ([place])   . Syncope 11.7.14   secondary to dehydration and possible hypoglycemia  . Tendonitis   . Thyroid disease   . Transfusion history    age 36 "anemia"    Surgeries: Procedure(s): RIGHTTOTAL HIP ARTHROPLASTY ANTERIOR APPROACH on 03/30/2021   Consultants:   Discharged Condition: Improved  Hospital Course: Christina Sellers is an 80 y.o. female who was admitted 03/30/2021 for operative treatment ofOsteoarthritis of right hip. Patient has severe unremitting pain that affects sleep, daily activities, and work/hobbies. After pre-op clearance the patient was taken to the operating room on 03/30/2021 and underwent  Procedure(s): RIGHTTOTAL HIP ARTHROPLASTY ANTERIOR APPROACH.    Patient was given perioperative antibiotics:  Anti-infectives (From admission, onward)   Start     Dose/Rate Route Frequency Ordered Stop    03/30/21 1300  ceFAZolin (ANCEF) IVPB 2g/100 mL premix        2 g 200 mL/hr over 30 Minutes Intravenous On call to O.R. 03/30/21 1250 03/30/21 1510       Patient was given sequential compression devices, early ambulation, and chemoprophylaxis to prevent DVT.  Patient benefited maximally from hospital stay and there were no complications.    Recent vital signs:  Patient Vitals for the past 24 hrs:  BP Temp Temp src Pulse Resp SpO2  04/01/21 0550 (!) 156/62 97.9 F (36.6 C) Oral 85 17 100 %  03/31/21 2132 (!) 155/55 98.2 F (36.8 C) Oral 86 16 99 %  03/31/21 1456 (!) 135/59 98.1 F (36.7 C) Oral 77 16 98 %  03/31/21 1330 (!) 143/55 -- -- -- -- (!) 87 %  03/31/21 0953 (!) 141/56 98.1 F (36.7 C) Oral 84 16 96 %     Recent laboratory studies:  Recent Labs    03/31/21 0324 04/01/21 0701  WBC 12.8* 15.5*  HGB 9.3* 9.2*  HCT 29.1* 28.3*  PLT 162 141*  NA 136  --   K 4.2  --   CL 105  --   CO2 26  --   BUN 14  --   CREATININE 0.82  --   GLUCOSE 214*  --   CALCIUM 8.3*  --      Discharge Medications:   Allergies as of 04/01/2021  Reactions   Tape Other (See Comments)   SKIN IS VERY THIN- TAPE TEARS VERY EASILY!!   Celexa [citalopram Hydrobromide] Other (See Comments)   Nightmares   Fluconazole Other (See Comments)   Unknown reaction   Green Tea Leaf Ext Other (See Comments)   Mouth ulcers   Kenalog [triamcinolone] Other (See Comments)   Spinal injection caused sweats   Levaquin [levofloxacin] Hives   Losartan Potassium-hctz Other (See Comments)   Night sweats/ fatigue   Methocarbamol Other (See Comments)   500 mg is too much- can tolerate a lesser dose   Nitrofurantoin Macrocrystal Other (See Comments)   Possible vertigo   Scopolamine Other (See Comments)   Memory change   Sulfa Antibiotics Other (See Comments)   Reaction not recalled by the patient   Trimethoprim Hives      Medication List    STOP taking these medications   acetaminophen 325 MG  tablet Commonly known as: TYLENOL   acetaminophen 500 MG tablet Commonly known as: TYLENOL   acetaminophen 650 MG CR tablet Commonly known as: TYLENOL     TAKE these medications   ascorbic acid 500 MG tablet Commonly known as: VITAMIN C Take 500 mg by mouth daily.   aspirin EC 81 MG tablet Take 1 tablet (81 mg total) by mouth 2 (two) times daily.   BIOFREEZE EX Apply 1 application topically 2 (two) times daily as needed (for knee pain).   blood glucose meter kit and supplies Kit Dispense based on patient and insurance preference. Use up to four times daily as directed.   denosumab 60 MG/ML Sosy injection Commonly known as: PROLIA Inject 60 mg into the skin every 6 (six) months.   diclofenac sodium 1 % Gel Commonly known as: VOLTAREN Apply 1 application topically 2 (two) times daily as needed (to painful sites).   feeding supplement Liqd Take 237 mLs by mouth 2 (two) times daily between meals. What changed: when to take this   finasteride 5 MG tablet Commonly known as: PROSCAR Take 2.5 mg by mouth daily.   fluticasone 50 MCG/ACT nasal spray Commonly known as: FLONASE Place 1 spray into both nostrils daily as needed (for seasonal allergies).   levothyroxine 25 MCG tablet Commonly known as: SYNTHROID Take 1-4 tablets (25-100 mcg total) by mouth See admin instructions. Take 25 mcg by mouth in the morning before breakfast on Sun/Tues/Thurs/Sat and 100 mcg on Mon/Wed/Fri What changed:   how much to take  additional instructions   levothyroxine 50 MCG tablet Commonly known as: SYNTHROID Take 1 tablet (50 mcg total) by mouth every Monday, Wednesday, and Friday. What changed: Another medication with the same name was changed. Make sure you understand how and when to take each.   METAMUCIL PO Take 1 Scoop by mouth at bedtime as needed (constipation- mix and drink as directed).   methocarbamol 500 MG tablet Commonly known as: ROBAXIN Take 250 mg by mouth 3 (three)  times daily as needed for muscle spasms (or back pain).   Methylcobalamin 1000 MCG Tbdp Take 1,000 mcg by mouth daily.   multivitamin with minerals tablet Take 1 tablet by mouth daily.   ondansetron 4 MG disintegrating tablet Commonly known as: Zofran ODT Take 1 tablet (4 mg total) by mouth every 8 (eight) hours as needed for nausea or vomiting.   OVER THE COUNTER MEDICATION Take 2 capsules by mouth daily. Hydro Eye   oxyCODONE-acetaminophen 5-325 MG tablet Commonly known as: PERCOCET/ROXICET Take 1 tablet by mouth every 4 (  four) hours as needed for severe pain.   potassium chloride SA 20 MEQ tablet Commonly known as: KLOR-CON Take 1 tablet (20 mEq total) by mouth daily.   Restasis 0.05 % ophthalmic emulsion Generic drug: cycloSPORINE Place 1 drop into both eyes 2 (two) times daily.   rosuvastatin 5 MG tablet Commonly known as: CRESTOR Take 5 mg by mouth daily.   Systane Overnight Therapy 0.3 % Gel ophthalmic ointment Generic drug: hypromellose Place 1 application into both eyes at bedtime as needed for dry eyes.   Vitamin D3 50 MCG (2000 UT) capsule Take 2,000 mg by mouth daily.   vitamin E 200 UNIT capsule Take 200 Units by mouth daily.            Durable Medical Equipment  (From admission, onward)         Start     Ordered   03/30/21 1815  DME Walker rolling  Once       Question:  Patient needs a walker to treat with the following condition  Answer:  Status post right hip replacement   03/30/21 1814   03/30/21 1815  DME 3 n 1  Once        03/30/21 1814           Discharge Care Instructions  (From admission, onward)         Start     Ordered   04/01/21 0000  Weight bearing as tolerated        04/01/21 0832   03/30/21 0000  Weight bearing as tolerated        03/30/21 1643          Diagnostic Studies: DG C-Arm 1-60 Min-No Report  Result Date: 03/30/2021 Fluoroscopy was utilized by the requesting physician.  No radiographic interpretation.    DG HIP OPERATIVE UNILAT W OR W/O PELVIS RIGHT  Result Date: 03/30/2021 CLINICAL DATA:  Right hip replacement. EXAM: OPERATIVE RIGHT HIP (WITH PELVIS IF PERFORMED) TECHNIQUE: Fluoroscopic spot image(s) were submitted for interpretation post-operatively. COMPARISON:  None. FINDINGS: Three fluoroscopic spot views obtained in the operating room in the pelvis and right hip. Right hip arthroplasty in expected alignment. Total fluoroscopy time 9 seconds. IMPRESSION: Procedural fluoroscopy for right hip arthroplasty. Electronically Signed   By: Keith Rake M.D.   On: 03/30/2021 16:39    Disposition: Discharge disposition: 01-Home or Self Care       Discharge Instructions    Call MD / Call 911   Complete by: As directed    If you experience chest pain or shortness of breath, CALL 911 and be transported to the hospital emergency room.  If you develope a fever above 101 F, pus (white drainage) or increased drainage or redness at the wound, or calf pain, call your surgeon's office.   Call MD / Call 911   Complete by: As directed    If you experience chest pain or shortness of breath, CALL 911 and be transported to the hospital emergency room.  If you develope a fever above 101 F, pus (white drainage) or increased drainage or redness at the wound, or calf pain, call your surgeon's office.   Constipation Prevention   Complete by: As directed    Drink plenty of fluids.  Prune juice may be helpful.  You may use a stool softener, such as Colace (over the counter) 100 mg twice a day.  Use MiraLax (over the counter) for constipation as needed.   Constipation Prevention   Complete by:  As directed    Drink plenty of fluids.  Prune juice may be helpful.  You may use a stool softener, such as Colace (over the counter) 100 mg twice a day.  Use MiraLax (over the counter) for constipation as needed.   Diet - low sodium heart healthy   Complete by: As directed    Driving restrictions   Complete by: As  directed    No driving for 2 weeks   Driving restrictions   Complete by: As directed    No driving for 2 weeks   Increase activity slowly as tolerated   Complete by: As directed    Increase activity slowly as tolerated   Complete by: As directed    Patient may shower   Complete by: As directed    You may shower without a dressing once there is no drainage.  Do not wash over the wound.  If drainage remains, cover wound with plastic wrap and then shower.   Patient may shower   Complete by: As directed    You may shower without a dressing once there is no drainage.  Do not wash over the wound.  If drainage remains, cover wound with plastic wrap and then shower.   Post-operative opioid taper instructions:   Complete by: As directed    POST-OPERATIVE OPIOID TAPER INSTRUCTIONS: It is important to wean off of your opioid medication as soon as possible. If you do not need pain medication after your surgery it is ok to stop day one. Opioids include: Codeine, Hydrocodone(Norco, Vicodin), Oxycodone(Percocet, oxycontin) and hydromorphone amongst others.  Long term and even short term use of opiods can cause: Increased pain response Dependence Constipation Depression Respiratory depression And more.  Withdrawal symptoms can include Flu like symptoms Nausea, vomiting And more Techniques to manage these symptoms Hydrate well Eat regular healthy meals Stay active Use relaxation techniques(deep breathing, meditating, yoga) Do Not substitute Alcohol to help with tapering If you have been on opioids for less than two weeks and do not have pain than it is ok to stop all together.  Plan to wean off of opioids This plan should start within one week post op of your joint replacement. Maintain the same interval or time between taking each dose and first decrease the dose.  Cut the total daily intake of opioids by one tablet each day Next start to increase the time between doses. The last dose  that should be eliminated is the evening dose.      Post-operative opioid taper instructions:   Complete by: As directed    POST-OPERATIVE OPIOID TAPER INSTRUCTIONS: It is important to wean off of your opioid medication as soon as possible. If you do not need pain medication after your surgery it is ok to stop day one. Opioids include: Codeine, Hydrocodone(Norco, Vicodin), Oxycodone(Percocet, oxycontin) and hydromorphone amongst others.  Long term and even short term use of opiods can cause: Increased pain response Dependence Constipation Depression Respiratory depression And more.  Withdrawal symptoms can include Flu like symptoms Nausea, vomiting And more Techniques to manage these symptoms Hydrate well Eat regular healthy meals Stay active Use relaxation techniques(deep breathing, meditating, yoga) Do Not substitute Alcohol to help with tapering If you have been on opioids for less than two weeks and do not have pain than it is ok to stop all together.  Plan to wean off of opioids This plan should start within one week post op of your joint replacement. Maintain the same interval or time  between taking each dose and first decrease the dose.  Cut the total daily intake of opioids by one tablet each day Next start to increase the time between doses. The last dose that should be eliminated is the evening dose.      Weight bearing as tolerated   Complete by: As directed    Weight bearing as tolerated   Complete by: As directed        Follow-up Information    Frederik Pear, MD. Go on 04/09/2021.   Specialty: Orthopedic Surgery Why: Your appointment has been scheduled for 9:45  Contact information: [place] [address] [phone]        [facility] Go on 04/01/2021.   Why: Your appointment is scheduled for 12:00. Please arrive at 11:40 for paperwork  Contact information: [place] [address] [phone]        Frederik Pear, MD In 2 weeks.   Specialty: Orthopedic Surgery Contact information: [place] [address] [phone]                Signed: Joanell Rising 04/01/2021, 8:32 AM

## 2021-04-01 NOTE — Progress Notes (Signed)
PATIENT ID: Christina Sellers  MRN: [id number]  DOB/AGE:  82-Nov-1942 / 82 y.o.  2 Days Post-Op Procedure(s) (LRB): RIGHTTOTAL HIP ARTHROPLASTY ANTERIOR APPROACH (Right)    PROGRESS NOTE Subjective: Patient is alert, oriented, no Nausea, no Vomiting, yes passing gas, . Taking PO well. Denies SOB, Chest or Calf Pain. Using Incentive Spirometer, PAS in place. Ambulate WBAT with pt working on transfers. Patient reports pain as  5/10  .    Objective: Vital signs in last 24 hours: Vitals:   03/31/21 1330 03/31/21 1456 03/31/21 2132 04/01/21 0550  BP: (!) 143/55 (!) 135/59 (!) 155/55 (!) 156/62  Pulse:  77 86 85  Resp:  16 16 17   Temp:  98.1 F (36.7 C) 98.2 F (36.8 C) 97.9 F (36.6 C)  TempSrc:  Oral Oral Oral  SpO2: (!) 87% 98% 99% 100%  Weight:      Height:          Intake/Output from previous day: I/O last 3 completed shifts: In: 2790.4 [P.O.:810; I.V.:1980.4] Out: 1400 [Urine:1400]   Intake/Output this shift: No intake/output data recorded.   LABORATORY DATA: Recent Labs    03/30/21 1732 03/31/21 0324 04/01/21 0701  WBC  --  12.8* 15.5*  HGB  --  9.3* 9.2*  HCT  --  29.1* 28.3*  PLT  --  162 141*  NA  --  136  --   K  --  4.2  --   CL  --  105  --   CO2  --  26  --   BUN  --  14  --   CREATININE  --  0.82  --   GLUCOSE  --  214*  --   GLUCAP 95  --   --   CALCIUM  --  8.3*  --     Examination: Neurologically intact Neurovascular intact Sensation intact distally Intact pulses distally Dorsiflexion/Plantar flexion intact Incision: dressing C/D/I No cellulitis present Compartment soft} XR AP&Lat of hip shows well placed\fixed THA  Assessment:   2 Days Post-Op Procedure(s) (LRB): RIGHTTOTAL HIP ARTHROPLASTY ANTERIOR APPROACH (Right) ADDITIONAL DIAGNOSIS:  Expected Acute Blood Loss Anemia, hypothryroid, depression, HTN, COPD, orthostatic hypotension Anticipated LOS equal to or greater than 2 midnights due to - Age 25 and older with one or more of the  following:  - Obesity  - Expected need for hospital services (PT, OT, Nursing) required for safe  discharge  - Anticipated need for postoperative skilled nursing care or inpatient rehab  - Active co-morbidities: Respiratory Failure/COPD OR   - Unanticipated findings during/Post Surgery: Slow post-op progression: GI, pain control, mobility       Plan: PT/OT WBAT, THA  DVT Prophylaxis: SCDx72 hrs, ASA 81 mg BID x 2 weeks  DISCHARGE PLAN: Home, when pt passes therapy goals  DISCHARGE NEEDS: HHPT, Walker and 3-in-1 comode seat

## 2021-04-01 NOTE — Plan of Care (Signed)
Plan of care reviewed and discussed with the patient. 

## 2021-04-01 NOTE — TOC Progression Note (Signed)
Transition of Care [facility]) - Progression Note    Patient Details  Name: Christina Sellers MRN: [id number] Date of Birth: [birth year]  [facility]) CM/SW Contact  Lennart Pall, LCSW Phone Number: 04/01/2021, 3:22 PM  Clinical Narrative:     Alerted by PT that pt in need of 3n1 commode.  Alerted Ladell Heads, RN CM with MD office and she will order via [place].    Barriers to Discharge: No Barriers Identified  Expected Discharge Plan and Services           Expected Discharge Date: 04/02/21               DME Arranged: N/A DME Agency: NA                   Social Determinants of Health (SDOH) Interventions    Readmission Risk Interventions No flowsheet data found.

## 2021-04-02 DIAGNOSIS — Z87891 Personal history of nicotine dependence: Secondary | ICD-10-CM | POA: Diagnosis not present

## 2021-04-02 DIAGNOSIS — Z79899 Other long term (current) drug therapy: Secondary | ICD-10-CM | POA: Diagnosis not present

## 2021-04-02 DIAGNOSIS — E039 Hypothyroidism, unspecified: Secondary | ICD-10-CM | POA: Diagnosis not present

## 2021-04-02 DIAGNOSIS — M1611 Unilateral primary osteoarthritis, right hip: Secondary | ICD-10-CM | POA: Diagnosis not present

## 2021-04-02 DIAGNOSIS — I5032 Chronic diastolic (congestive) heart failure: Secondary | ICD-10-CM | POA: Diagnosis not present

## 2021-04-02 DIAGNOSIS — I110 Hypertensive heart disease with heart failure: Secondary | ICD-10-CM | POA: Diagnosis not present

## 2021-04-02 DIAGNOSIS — E1165 Type 2 diabetes mellitus with hyperglycemia: Secondary | ICD-10-CM | POA: Diagnosis not present

## 2021-04-02 LAB — CBC
HCT: 28.5 % — ABNORMAL LOW (ref 36.0–46.0)
Hemoglobin: 9.2 g/dL — ABNORMAL LOW (ref 12.0–15.0)
MCH: 33.3 pg (ref 26.0–34.0)
MCHC: 32.3 g/dL (ref 30.0–36.0)
MCV: 103.3 fL — ABNORMAL HIGH (ref 80.0–100.0)
Platelets: 154 10*3/uL (ref 150–400)
RBC: 2.76 MIL/uL — ABNORMAL LOW (ref 3.87–5.11)
RDW: 13.8 % (ref 11.5–15.5)
WBC: 12.4 10*3/uL — ABNORMAL HIGH (ref 4.0–10.5)
nRBC: 0 % (ref 0.0–0.2)

## 2021-04-02 NOTE — Progress Notes (Signed)
Physical Therapy Treatment Patient Details Name: Christina Sellers MRN: [id number] DOB: 08-05-1941 Today's Date: 04/02/2021    History of Present Illness 80 y.o. female admitted 03/30/21 for R AA-THA. PMH includes, orthostatic hypotension, CHF, HTN, IBS, gout, DM. Pt had recent admission 02/11/21 with syncope with PEA arrest.    PT Comments    Pt ambulated 17' with RW. She continues to feel "woozy" and "off balance" but had no loss of balance while walking. Pt/daughter feel they can manage at home and would like to DC today.   Follow Up Recommendations  Follow surgeon's recommendation for DC plan and follow-up therapies;Home health PT     Equipment Recommendations  3in1 (PT)    Recommendations for Other Services       Precautions / Restrictions Precautions Precautions: Fall Precaution Comments: 3 falls in past 6 months (pt had hospitalization in March 2* syncope, per son her mediations were adjusted and syncopal events have resolved) Restrictions Weight Bearing Restrictions: No RLE Weight Bearing: Weight bearing as tolerated Other Position/Activity Restrictions: WBAT    Mobility  Bed Mobility Overal bed mobility: Needs Assistance Bed Mobility: Sit to Supine     Supine to sit: Mod assist     General bed mobility comments: up in recliner    Transfers Overall transfer level: Needs assistance Equipment used: Rolling walker (2 wheeled) Transfers: Sit to/from Stand Sit to Stand: Min guard         General transfer comment: no assist needed, min/guard for safety  Ambulation/Gait Ambulation/Gait assistance: Min guard Gait Distance (Feet): 90 Feet Assistive device: Rolling walker (2 wheeled) Gait Pattern/deviations: Step-to pattern;Decreased step length - right;Decreased step length - left;Decreased weight shift to right Gait velocity: decr   General Gait Details: steady, no loss of balance, but pt stated she feels "off balance"   Stairs Stairs:  (verbally reviewed  technique for stairs with pt/daughter) Stairs assistance: Min assist Stair Management: Two rails;Forwards;Step to pattern       Wheelchair Mobility    Modified Rankin (Stroke Patients Only)       Balance Overall balance assessment: Needs assistance Sitting-balance support: Feet supported;No upper extremity supported Sitting balance-Leahy Scale: Good     Standing balance support: Bilateral upper extremity supported;During functional activity Standing balance-Leahy Scale: Poor Standing balance comment: relies on BUE pport with walking                            Cognition Arousal/Alertness: Awake/alert Behavior During Therapy: WFL for tasks assessed/performed Overall Cognitive Status: Within Functional Limits for tasks assessed                                        Exercises Total Joint Exercises Ankle Circles/Pumps: AROM;Both;10 reps;Supine Short Arc Quad: AROM;Right;10 reps;Supine Heel Slides: AAROM;Right;10 reps;Supine Hip ABduction/ADduction: AAROM;Right;10 reps;Supine    General Comments        Pertinent Vitals/Pain Pain Score: 7  Pain Location: R hip with movement Pain Descriptors / Indicators: Guarding;Grimacing Pain Intervention(s): Limited activity within patient's tolerance;Monitored during session;Premedicated before session;Ice applied;Repositioned    Home Living                      Prior Function            PT Goals (current goals can now be found in the care plan section) Acute Rehab PT Goals  Patient Stated Goal: walk, decrease pain PT Goal Formulation: With patient/family Time For Goal Achievement: 04/07/21 Potential to Achieve Goals: Good Progress towards PT goals: Progressing toward goals    Frequency    7X/week      PT Plan Current plan remains appropriate    Co-evaluation              AM-PAC PT "6 Clicks" Mobility   Outcome Measure  Help needed turning from your back to your side  while in a flat bed without using bedrails?: A Little Help needed moving from lying on your back to sitting on the side of a flat bed without using bedrails?: A Lot Help needed moving to and from a bed to a chair (including a wheelchair)?: A Little Help needed standing up from a chair using your arms (e.g., wheelchair or bedside chair)?: A Little Help needed to walk in hospital room?: A Little Help needed climbing 3-5 steps with a railing? : A Little 6 Click Score: 17    End of Session Equipment Utilized During Treatment: Gait belt Activity Tolerance: Patient limited by fatigue;Patient limited by pain;Treatment limited secondary to medical complications (Comment) ("wooziness") Patient left: with call bell/phone within reach;in chair;with chair alarm set Nurse Communication: Mobility status PT Visit Diagnosis: Difficulty in walking, not elsewhere classified (R26.2);Pain Pain - Right/Left: Right Pain - part of body: Hip     Time: 0051-1021 PT Time Calculation (min) (ACUTE ONLY): 29 min  Charges:  $Gait Training: 8-22 mins  $Therapeutic Activity: 8-22 mins                    Blondell Reveal Kistler PT 04/02/2021  Acute Rehabilitation Services Pager [phone] Office [phone]

## 2021-04-02 NOTE — TOC Transition Note (Signed)
Transition of Care [facility]) - CM/SW Discharge Note  Patient Details  Name: Christina Sellers MRN: [id number] Date of Birth: [birth year]  [facility]) CM/SW Contact:  Sherie Don, LCSW Phone Number: 04/02/2021, 10:13 AM  Clinical Narrative: Patient expected to discharge home today after working with PT. CSW confirmed with patient [employer] has delivered the 3N1 to patient's room. No additional TOC needs at this time. TOC signing off.  Final next level of care: OP Rehab Barriers to Discharge: No Barriers Identified  Patient Goals and CMS Choice Patient states their goals for this hospitalization and ongoing recovery are:: Return home CMS Medicare.gov Compare Post Acute Care list provided to:: Patient Choice offered to / list presented to : Patient  Discharge Plan and Services         DME Arranged: 3-N-1 DME Agency: [employer] Date DME Agency Contacted: 04/01/21 Representative spoke with at DME Agency: Ovid Curd  Readmission Risk Interventions No flowsheet data found.

## 2021-04-02 NOTE — Progress Notes (Signed)
PATIENT ID: Christina Sellers  MRN: [id number]  DOB/AGE:  [birth year] / 80 y.o.  3 Days Post-Op Procedure(s) (LRB): RIGHTTOTAL HIP ARTHROPLASTY ANTERIOR APPROACH (Right)    PROGRESS NOTE Subjective: Patient is alert, oriented, no Nausea, no Vomiting, yes passing gas, . Taking PO well. Denies SOB, Chest or Calf Pain. Using Incentive Spirometer, PAS in place. Ambulate WBAT with pt walking 80 ft with therapy Patient reports pain as  Moderate with movement  .    Objective: Vital signs in last 24 hours: Vitals:   04/01/21 1000 04/01/21 1450 04/01/21 2327 04/02/21 0535  BP: (!) 127/49 140/61 140/68 126/63  Pulse: 86 83 84 79  Resp:  16 16 16   Temp:  98.2 F (36.8 C) 98 F (36.7 C) 98.1 F (36.7 C)  TempSrc:  Oral Oral Oral  SpO2: 97% 91% 94% 100%  Weight:      Height:          Intake/Output from previous day: I/O last 3 completed shifts: In: 1681.4 [P.O.:960; I.V.:721.4] Out: 225 [Urine:225]   Intake/Output this shift: No intake/output data recorded.   LABORATORY DATA: Recent Labs    03/30/21 1732 03/31/21 0324 03/31/21 0324 04/01/21 0701 04/02/21 0321  WBC  --  12.8*   < > 15.5* 12.4*  HGB  --  9.3*   < > 9.2* 9.2*  HCT  --  29.1*   < > 28.3* 28.5*  PLT  --  162   < > 141* 154  NA  --  136  --   --   --   K  --  4.2  --   --   --   CL  --  105  --   --   --   CO2  --  26  --   --   --   BUN  --  14  --   --   --   CREATININE  --  0.82  --   --   --   GLUCOSE  --  214*  --   --   --   GLUCAP 95  --   --   --   --   CALCIUM  --  8.3*  --   --   --    < > = values in this interval not displayed.    Examination: Neurologically intact Neurovascular intact Sensation intact distally Intact pulses distally Dorsiflexion/Plantar flexion intact Incision: dressing C/D/I and no drainage No cellulitis present Compartment soft} XR AP&Lat of hip shows well placed\fixed THA  Assessment:   3 Days Post-Op Procedure(s) (LRB): RIGHTTOTAL HIP ARTHROPLASTY ANTERIOR APPROACH  (Right) ADDITIONAL DIAGNOSIS:  Expected Acute Blood Loss Anemia, hypothryroid, depression, HTN, COPD, orthostatic hypotension Anticipated LOS equal to or greater than 2 midnights due to - Age 82 and older with one or more of the following:  - Obesity  - Expected need for hospital services (PT, OT, Nursing) required for safe  discharge  - Anticipated need for postoperative skilled nursing care or inpatient rehab  - Active co-morbidities: Respiratory Failure/COPD    Plan: PT/OT WBAT, THA  DVT Prophylaxis: SCDx72 hrs, ASA 81 mg BID x 2 weeks  DISCHARGE PLAN: Home  DISCHARGE NEEDS: HHPT, Walker and 3-in-1 comode seat

## 2021-04-02 NOTE — Progress Notes (Signed)
Physical Therapy Treatment Patient Details Name: Christina Sellers MRN: [id number] DOB: 10-14-1941 Today's Date: 04/02/2021    History of Present Illness 80 y.o. female admitted 03/30/21 for R AA-THA. PMH includes, orthostatic hypotension, CHF, HTN, IBS, gout, DM. Pt had recent admission 02/11/21 with syncope with PEA arrest.    PT Comments    Pt reported feeling, "woozy" while at rest in bed at start of PT session. She denied sensation of spinning, but did report increased "wooziness" with head flexion/extension, no nystagmus noted, she does have h/o vertigo. Orthostatic vitals taken and were negative for drop in systolic BP (see flowsheets), SaO2 99% on RA. Pt was only able to take a few steps from bed to recliner with RW due to the "woozy/off balance" sensation, no loss of balance during stand pivot transfer. Performed R THA exercises with assistance. Pt has had a significant decline in activity tolerance today. RN notified. Will plan to see pt again after lunch.    Follow Up Recommendations  Follow surgeon's recommendation for DC plan and follow-up therapies;Home health PT     Equipment Recommendations  3in1 (PT)    Recommendations for Other Services       Precautions / Restrictions Precautions Precautions: Fall Precaution Comments: 3 falls in past 6 months (pt had hospitalization in March 2* syncope, per son her mediations were adjusted and syncopal events have resolved) Restrictions Weight Bearing Restrictions: No RLE Weight Bearing: Weight bearing as tolerated Other Position/Activity Restrictions: WBAT    Mobility  Bed Mobility Overal bed mobility: Needs Assistance Bed Mobility: Sit to Supine     Supine to sit: Mod assist     General bed mobility comments: assist to raise trunk, pt advanced RLE using gait belt as a leg lifter    Transfers Overall transfer level: Needs assistance Equipment used: Rolling walker (2 wheeled) Transfers: Sit to/from Stand Sit to Stand:  Min assist         General transfer comment: assist to power up  Ambulation/Gait Ambulation/Gait assistance: Min guard Gait Distance (Feet): 3 Feet Assistive device: Rolling walker (2 wheeled) Gait Pattern/deviations: Step-to pattern;Decreased step length - right;Decreased step length - left;Decreased weight shift to right Gait velocity: decr   General Gait Details: steady, no loss of balance, distance limited by "wooziness", assessed orthostatic vitals (see flowsheets), pt did not have drop in systolic BP with position changes, she did report increased "wooziness" with up/downward movement of head (no nystagmus noted), pt has h/o vertigo but denies sensations of room spinning today   Stairs   Stairs assistance: Min assist Stair Management: Two rails;Forwards;Step to pattern       Wheelchair Mobility    Modified Rankin (Stroke Patients Only)       Balance Overall balance assessment: Needs assistance Sitting-balance support: Feet supported;No upper extremity supported Sitting balance-Leahy Scale: Good     Standing balance support: Bilateral upper extremity supported;During functional activity Standing balance-Leahy Scale: Poor Standing balance comment: relies on BUE pport with walking                            Cognition Arousal/Alertness: Awake/alert Behavior During Therapy: WFL for tasks assessed/performed Overall Cognitive Status: Within Functional Limits for tasks assessed                                        Exercises Total Joint Exercises Ankle  Circles/Pumps: AROM;Both;10 reps;Supine Short Arc Quad: AROM;Right;10 reps;Supine Heel Slides: AAROM;Right;10 reps;Supine Hip ABduction/ADduction: AAROM;Right;10 reps;Supine    General Comments        Pertinent Vitals/Pain Pain Score: 7  Pain Location: R hip with movement Pain Descriptors / Indicators: Guarding;Grimacing Pain Intervention(s): Limited activity within patient's  tolerance;Monitored during session;Premedicated before session;Ice applied;Repositioned    Home Living                      Prior Function            PT Goals (current goals can now be found in the care plan section) Acute Rehab PT Goals Patient Stated Goal: walk, decrease pain PT Goal Formulation: With patient/family Time For Goal Achievement: 04/07/21 Potential to Achieve Goals: Good Progress towards PT goals: Not progressing toward goals - comment ("woozy")    Frequency    7X/week      PT Plan Current plan remains appropriate    Co-evaluation              AM-PAC PT "6 Clicks" Mobility   Outcome Measure  Help needed turning from your back to your side while in a flat bed without using bedrails?: A Little Help needed moving from lying on your back to sitting on the side of a flat bed without using bedrails?: A Lot Help needed moving to and from a bed to a chair (including a wheelchair)?: A Little Help needed standing up from a chair using your arms (e.g., wheelchair or bedside chair)?: A Little Help needed to walk in hospital room?: A Little Help needed climbing 3-5 steps with a railing? : A Little 6 Click Score: 17    End of Session Equipment Utilized During Treatment: Gait belt Activity Tolerance: Patient limited by fatigue;Patient limited by pain;Treatment limited secondary to medical complications (Comment) ("wooziness") Patient left: with call bell/phone within reach;in chair;with chair alarm set Nurse Communication: Mobility status PT Visit Diagnosis: Difficulty in walking, not elsewhere classified (R26.2);Pain Pain - Right/Left: Right Pain - part of body: Hip     Time: 9798-9211 PT Time Calculation (min) (ACUTE ONLY): 37 min  Charges:  $Therapeutic Exercise: 8-22 mins $Therapeutic Activity: 8-22 mins                    Blondell Reveal Kistler PT 04/02/2021  Acute Rehabilitation Services Pager [phone] Office [phone]

## 2021-04-04 DIAGNOSIS — M109 Gout, unspecified: Secondary | ICD-10-CM | POA: Diagnosis not present

## 2021-04-04 DIAGNOSIS — F32A Depression, unspecified: Secondary | ICD-10-CM | POA: Diagnosis not present

## 2021-04-04 DIAGNOSIS — E7800 Pure hypercholesterolemia, unspecified: Secondary | ICD-10-CM | POA: Diagnosis not present

## 2021-04-04 DIAGNOSIS — I6529 Occlusion and stenosis of unspecified carotid artery: Secondary | ICD-10-CM | POA: Diagnosis not present

## 2021-04-04 DIAGNOSIS — E039 Hypothyroidism, unspecified: Secondary | ICD-10-CM | POA: Diagnosis not present

## 2021-04-04 DIAGNOSIS — K589 Irritable bowel syndrome without diarrhea: Secondary | ICD-10-CM | POA: Diagnosis not present

## 2021-04-04 DIAGNOSIS — I5032 Chronic diastolic (congestive) heart failure: Secondary | ICD-10-CM | POA: Diagnosis not present

## 2021-04-04 DIAGNOSIS — Z471 Aftercare following joint replacement surgery: Secondary | ICD-10-CM | POA: Diagnosis not present

## 2021-04-04 DIAGNOSIS — I152 Hypertension secondary to endocrine disorders: Secondary | ICD-10-CM | POA: Diagnosis not present

## 2021-04-04 DIAGNOSIS — Z96641 Presence of right artificial hip joint: Secondary | ICD-10-CM | POA: Diagnosis not present

## 2021-04-04 DIAGNOSIS — I2510 Atherosclerotic heart disease of native coronary artery without angina pectoris: Secondary | ICD-10-CM | POA: Diagnosis not present

## 2021-04-04 DIAGNOSIS — K219 Gastro-esophageal reflux disease without esophagitis: Secondary | ICD-10-CM | POA: Diagnosis not present

## 2021-04-04 DIAGNOSIS — E1151 Type 2 diabetes mellitus with diabetic peripheral angiopathy without gangrene: Secondary | ICD-10-CM | POA: Diagnosis not present

## 2021-04-04 DIAGNOSIS — E1159 Type 2 diabetes mellitus with other circulatory complications: Secondary | ICD-10-CM | POA: Diagnosis not present

## 2021-04-04 DIAGNOSIS — G8929 Other chronic pain: Secondary | ICD-10-CM | POA: Diagnosis not present

## 2021-04-04 DIAGNOSIS — J449 Chronic obstructive pulmonary disease, unspecified: Secondary | ICD-10-CM | POA: Diagnosis not present

## 2021-04-04 DIAGNOSIS — I951 Orthostatic hypotension: Secondary | ICD-10-CM | POA: Diagnosis not present

## 2021-04-04 DIAGNOSIS — Z7951 Long term (current) use of inhaled steroids: Secondary | ICD-10-CM | POA: Diagnosis not present

## 2021-04-04 DIAGNOSIS — J019 Acute sinusitis, unspecified: Secondary | ICD-10-CM | POA: Diagnosis not present

## 2021-04-04 DIAGNOSIS — M8580 Other specified disorders of bone density and structure, unspecified site: Secondary | ICD-10-CM | POA: Diagnosis not present

## 2021-04-04 DIAGNOSIS — Z87891 Personal history of nicotine dependence: Secondary | ICD-10-CM | POA: Diagnosis not present

## 2021-04-04 DIAGNOSIS — M3500 Sicca syndrome, unspecified: Secondary | ICD-10-CM | POA: Diagnosis not present

## 2021-04-04 DIAGNOSIS — M545 Low back pain, unspecified: Secondary | ICD-10-CM | POA: Diagnosis not present

## 2021-04-06 DIAGNOSIS — Z96641 Presence of right artificial hip joint: Secondary | ICD-10-CM | POA: Diagnosis not present

## 2021-04-06 DIAGNOSIS — E7800 Pure hypercholesterolemia, unspecified: Secondary | ICD-10-CM | POA: Diagnosis not present

## 2021-04-06 DIAGNOSIS — Z7951 Long term (current) use of inhaled steroids: Secondary | ICD-10-CM | POA: Diagnosis not present

## 2021-04-06 DIAGNOSIS — E039 Hypothyroidism, unspecified: Secondary | ICD-10-CM | POA: Diagnosis not present

## 2021-04-06 DIAGNOSIS — M109 Gout, unspecified: Secondary | ICD-10-CM | POA: Diagnosis not present

## 2021-04-06 DIAGNOSIS — M8580 Other specified disorders of bone density and structure, unspecified site: Secondary | ICD-10-CM | POA: Diagnosis not present

## 2021-04-06 DIAGNOSIS — J449 Chronic obstructive pulmonary disease, unspecified: Secondary | ICD-10-CM | POA: Diagnosis not present

## 2021-04-06 DIAGNOSIS — J019 Acute sinusitis, unspecified: Secondary | ICD-10-CM | POA: Diagnosis not present

## 2021-04-06 DIAGNOSIS — I951 Orthostatic hypotension: Secondary | ICD-10-CM | POA: Diagnosis not present

## 2021-04-06 DIAGNOSIS — F32A Depression, unspecified: Secondary | ICD-10-CM | POA: Diagnosis not present

## 2021-04-06 DIAGNOSIS — I6529 Occlusion and stenosis of unspecified carotid artery: Secondary | ICD-10-CM | POA: Diagnosis not present

## 2021-04-06 DIAGNOSIS — I2510 Atherosclerotic heart disease of native coronary artery without angina pectoris: Secondary | ICD-10-CM | POA: Diagnosis not present

## 2021-04-06 DIAGNOSIS — Z471 Aftercare following joint replacement surgery: Secondary | ICD-10-CM | POA: Diagnosis not present

## 2021-04-06 DIAGNOSIS — G8929 Other chronic pain: Secondary | ICD-10-CM | POA: Diagnosis not present

## 2021-04-06 DIAGNOSIS — E1159 Type 2 diabetes mellitus with other circulatory complications: Secondary | ICD-10-CM | POA: Diagnosis not present

## 2021-04-06 DIAGNOSIS — E1151 Type 2 diabetes mellitus with diabetic peripheral angiopathy without gangrene: Secondary | ICD-10-CM | POA: Diagnosis not present

## 2021-04-06 DIAGNOSIS — I5032 Chronic diastolic (congestive) heart failure: Secondary | ICD-10-CM | POA: Diagnosis not present

## 2021-04-06 DIAGNOSIS — M545 Low back pain, unspecified: Secondary | ICD-10-CM | POA: Diagnosis not present

## 2021-04-06 DIAGNOSIS — K219 Gastro-esophageal reflux disease without esophagitis: Secondary | ICD-10-CM | POA: Diagnosis not present

## 2021-04-06 DIAGNOSIS — I152 Hypertension secondary to endocrine disorders: Secondary | ICD-10-CM | POA: Diagnosis not present

## 2021-04-06 DIAGNOSIS — K589 Irritable bowel syndrome without diarrhea: Secondary | ICD-10-CM | POA: Diagnosis not present

## 2021-04-06 DIAGNOSIS — M3500 Sicca syndrome, unspecified: Secondary | ICD-10-CM | POA: Diagnosis not present

## 2021-04-06 DIAGNOSIS — Z87891 Personal history of nicotine dependence: Secondary | ICD-10-CM | POA: Diagnosis not present

## 2021-04-08 DIAGNOSIS — Z471 Aftercare following joint replacement surgery: Secondary | ICD-10-CM | POA: Diagnosis not present

## 2021-04-08 DIAGNOSIS — E1151 Type 2 diabetes mellitus with diabetic peripheral angiopathy without gangrene: Secondary | ICD-10-CM | POA: Diagnosis not present

## 2021-04-08 DIAGNOSIS — E039 Hypothyroidism, unspecified: Secondary | ICD-10-CM | POA: Diagnosis not present

## 2021-04-08 DIAGNOSIS — Z7951 Long term (current) use of inhaled steroids: Secondary | ICD-10-CM | POA: Diagnosis not present

## 2021-04-08 DIAGNOSIS — I152 Hypertension secondary to endocrine disorders: Secondary | ICD-10-CM | POA: Diagnosis not present

## 2021-04-08 DIAGNOSIS — Z87891 Personal history of nicotine dependence: Secondary | ICD-10-CM | POA: Diagnosis not present

## 2021-04-08 DIAGNOSIS — I6529 Occlusion and stenosis of unspecified carotid artery: Secondary | ICD-10-CM | POA: Diagnosis not present

## 2021-04-08 DIAGNOSIS — M3500 Sicca syndrome, unspecified: Secondary | ICD-10-CM | POA: Diagnosis not present

## 2021-04-08 DIAGNOSIS — J019 Acute sinusitis, unspecified: Secondary | ICD-10-CM | POA: Diagnosis not present

## 2021-04-08 DIAGNOSIS — Z96641 Presence of right artificial hip joint: Secondary | ICD-10-CM | POA: Diagnosis not present

## 2021-04-08 DIAGNOSIS — E1159 Type 2 diabetes mellitus with other circulatory complications: Secondary | ICD-10-CM | POA: Diagnosis not present

## 2021-04-08 DIAGNOSIS — I5032 Chronic diastolic (congestive) heart failure: Secondary | ICD-10-CM | POA: Diagnosis not present

## 2021-04-08 DIAGNOSIS — E7800 Pure hypercholesterolemia, unspecified: Secondary | ICD-10-CM | POA: Diagnosis not present

## 2021-04-08 DIAGNOSIS — G8929 Other chronic pain: Secondary | ICD-10-CM | POA: Diagnosis not present

## 2021-04-08 DIAGNOSIS — M8580 Other specified disorders of bone density and structure, unspecified site: Secondary | ICD-10-CM | POA: Diagnosis not present

## 2021-04-08 DIAGNOSIS — K589 Irritable bowel syndrome without diarrhea: Secondary | ICD-10-CM | POA: Diagnosis not present

## 2021-04-08 DIAGNOSIS — I951 Orthostatic hypotension: Secondary | ICD-10-CM | POA: Diagnosis not present

## 2021-04-08 DIAGNOSIS — J449 Chronic obstructive pulmonary disease, unspecified: Secondary | ICD-10-CM | POA: Diagnosis not present

## 2021-04-08 DIAGNOSIS — M545 Low back pain, unspecified: Secondary | ICD-10-CM | POA: Diagnosis not present

## 2021-04-08 DIAGNOSIS — I2510 Atherosclerotic heart disease of native coronary artery without angina pectoris: Secondary | ICD-10-CM | POA: Diagnosis not present

## 2021-04-08 DIAGNOSIS — M109 Gout, unspecified: Secondary | ICD-10-CM | POA: Diagnosis not present

## 2021-04-08 DIAGNOSIS — K219 Gastro-esophageal reflux disease without esophagitis: Secondary | ICD-10-CM | POA: Diagnosis not present

## 2021-04-08 DIAGNOSIS — F32A Depression, unspecified: Secondary | ICD-10-CM | POA: Diagnosis not present

## 2021-04-09 DIAGNOSIS — Z96641 Presence of right artificial hip joint: Secondary | ICD-10-CM | POA: Diagnosis not present

## 2021-04-09 DIAGNOSIS — Z9889 Other specified postprocedural states: Secondary | ICD-10-CM | POA: Diagnosis not present

## 2021-04-09 DIAGNOSIS — M25551 Pain in right hip: Secondary | ICD-10-CM | POA: Diagnosis not present

## 2021-04-13 DIAGNOSIS — Z96641 Presence of right artificial hip joint: Secondary | ICD-10-CM | POA: Diagnosis not present

## 2021-04-13 DIAGNOSIS — M25651 Stiffness of right hip, not elsewhere classified: Secondary | ICD-10-CM | POA: Diagnosis not present

## 2021-04-13 DIAGNOSIS — M6281 Muscle weakness (generalized): Secondary | ICD-10-CM | POA: Diagnosis not present

## 2021-04-15 DIAGNOSIS — R6883 Chills (without fever): Secondary | ICD-10-CM | POA: Diagnosis not present

## 2021-04-15 DIAGNOSIS — I10 Essential (primary) hypertension: Secondary | ICD-10-CM | POA: Diagnosis not present

## 2021-04-15 DIAGNOSIS — R197 Diarrhea, unspecified: Secondary | ICD-10-CM | POA: Diagnosis not present

## 2021-04-15 DIAGNOSIS — E039 Hypothyroidism, unspecified: Secondary | ICD-10-CM | POA: Diagnosis not present

## 2021-04-15 DIAGNOSIS — I359 Nonrheumatic aortic valve disorder, unspecified: Secondary | ICD-10-CM | POA: Diagnosis not present

## 2021-04-15 DIAGNOSIS — L659 Nonscarring hair loss, unspecified: Secondary | ICD-10-CM | POA: Diagnosis not present

## 2021-04-15 DIAGNOSIS — Z96649 Presence of unspecified artificial hip joint: Secondary | ICD-10-CM | POA: Diagnosis not present

## 2021-04-22 DIAGNOSIS — M25651 Stiffness of right hip, not elsewhere classified: Secondary | ICD-10-CM | POA: Diagnosis not present

## 2021-04-22 DIAGNOSIS — Z96641 Presence of right artificial hip joint: Secondary | ICD-10-CM | POA: Diagnosis not present

## 2021-04-22 DIAGNOSIS — M6281 Muscle weakness (generalized): Secondary | ICD-10-CM | POA: Diagnosis not present

## 2021-04-24 DIAGNOSIS — Z96641 Presence of right artificial hip joint: Secondary | ICD-10-CM | POA: Diagnosis not present

## 2021-04-24 DIAGNOSIS — M25651 Stiffness of right hip, not elsewhere classified: Secondary | ICD-10-CM | POA: Diagnosis not present

## 2021-04-24 DIAGNOSIS — M6281 Muscle weakness (generalized): Secondary | ICD-10-CM | POA: Diagnosis not present

## 2021-04-27 DIAGNOSIS — M6281 Muscle weakness (generalized): Secondary | ICD-10-CM | POA: Diagnosis not present

## 2021-04-27 DIAGNOSIS — M25651 Stiffness of right hip, not elsewhere classified: Secondary | ICD-10-CM | POA: Diagnosis not present

## 2021-04-27 DIAGNOSIS — Z96641 Presence of right artificial hip joint: Secondary | ICD-10-CM | POA: Diagnosis not present

## 2021-04-28 ENCOUNTER — Ambulatory Visit: Payer: Medicare Other | Admitting: Nurse Practitioner

## 2021-04-28 ENCOUNTER — Other Ambulatory Visit: Payer: Self-pay

## 2021-04-28 ENCOUNTER — Encounter: Payer: Self-pay | Admitting: Nurse Practitioner

## 2021-04-28 VITALS — BP 128/84

## 2021-04-28 DIAGNOSIS — N898 Other specified noninflammatory disorders of vagina: Secondary | ICD-10-CM | POA: Diagnosis not present

## 2021-04-28 DIAGNOSIS — L900 Lichen sclerosus et atrophicus: Secondary | ICD-10-CM | POA: Diagnosis not present

## 2021-04-28 DIAGNOSIS — R300 Dysuria: Secondary | ICD-10-CM | POA: Diagnosis not present

## 2021-04-28 MED ORDER — CLOBETASOL PROPIONATE 0.05 % EX OINT: TOPICAL_OINTMENT | CUTANEOUS | 2 refills | Status: DC

## 2021-04-28 MED ORDER — NYSTATIN 100000 UNIT/GM EX CREA: 1.0000 "application " | TOPICAL_CREAM | Freq: Two times a day (BID) | CUTANEOUS | 0 refills | Status: DC

## 2021-04-28 NOTE — Progress Notes (Signed)
   Acute Office Visit  Subjective:    Patient ID: Christina Sellers, female    DOB: 1941/08/13, 80 y.o.   MRN: [id number]   HPI 80 y.o. presents today for burning with urination. History of lichen sclerosus, has prescription for Clobetasol but does not use consistently and it has expired. She has occasional vaginal itching and would like a new prescription for nystatin cream as needed since this helps. Right hip surgery 03/29/21, recovering well.    Review of Systems  Constitutional: Negative.   Genitourinary: Positive for dysuria and vaginal pain (irritation/burning). Negative for flank pain, frequency, hematuria, urgency and vaginal discharge.       Objective:    Physical Exam Constitutional:      Appearance: Normal appearance.  Genitourinary:    Comments: Atrophy present. Fusion of anterior labia. Thin, white appearance consistent with LS    BP 128/84  Wt Readings from Last 3 Encounters:  03/30/21 129 lb 10.1 oz (58.8 kg)  03/23/21 129 lb 9.6 oz (58.8 kg)  02/27/21 130 lb (59 kg)   UA negative leukocytes, wbc 0-5, negative nitrate, negative blood, few bacteria, ph 6.0, protein 2+     Assessment & Plan:   Problem List Items Addressed This Visit   None   Visit Diagnoses    Lichen sclerosus    -  Primary   Relevant Medications   clobetasol ointment (TEMOVATE) 0.05 %   Dysuria       Relevant Orders   Urinalysis,Complete w/RFL Culture   Vaginal itching       Relevant Medications   nystatin cream (MYCOSTATIN)     Plan: Exam consistent with lichen sclerosus. Recommend Clobetasol 0.05% ointment daily x 2 weeks, decrease to every other day x 2 weeks, then twice weekly for maintenance. Refill provided on Nystatin cream but recommended not using until LS has improved. Urine culture pending but not suspicious for infection. She is agreeable to plan.     Tamela Gammon DNP, 12:53 PM 04/28/2021

## 2021-04-30 LAB — URINE CULTURE
MICRO NUMBER:: 11928059
SPECIMEN QUALITY:: ADEQUATE

## 2021-04-30 LAB — URINALYSIS, COMPLETE W/RFL CULTURE
Bilirubin Urine: NEGATIVE
Glucose, UA: NEGATIVE
Hgb urine dipstick: NEGATIVE
Hyaline Cast: NONE SEEN /LPF
Ketones, ur: NEGATIVE
Leukocyte Esterase: NEGATIVE
Nitrites, Initial: NEGATIVE
RBC / HPF: NONE SEEN /HPF (ref 0–2)
Specific Gravity, Urine: 1.019 (ref 1.001–1.035)
pH: 6 (ref 5.0–8.0)

## 2021-04-30 LAB — CULTURE INDICATED

## 2021-05-01 DIAGNOSIS — J449 Chronic obstructive pulmonary disease, unspecified: Secondary | ICD-10-CM | POA: Diagnosis not present

## 2021-05-01 DIAGNOSIS — M810 Age-related osteoporosis without current pathological fracture: Secondary | ICD-10-CM | POA: Diagnosis not present

## 2021-05-01 DIAGNOSIS — E1169 Type 2 diabetes mellitus with other specified complication: Secondary | ICD-10-CM | POA: Diagnosis not present

## 2021-05-01 DIAGNOSIS — I10 Essential (primary) hypertension: Secondary | ICD-10-CM | POA: Diagnosis not present

## 2021-05-01 DIAGNOSIS — M6281 Muscle weakness (generalized): Secondary | ICD-10-CM | POA: Diagnosis not present

## 2021-05-01 DIAGNOSIS — E1136 Type 2 diabetes mellitus with diabetic cataract: Secondary | ICD-10-CM | POA: Diagnosis not present

## 2021-05-01 DIAGNOSIS — M25651 Stiffness of right hip, not elsewhere classified: Secondary | ICD-10-CM | POA: Diagnosis not present

## 2021-05-01 DIAGNOSIS — E039 Hypothyroidism, unspecified: Secondary | ICD-10-CM | POA: Diagnosis not present

## 2021-05-01 DIAGNOSIS — N183 Chronic kidney disease, stage 3 unspecified: Secondary | ICD-10-CM | POA: Diagnosis not present

## 2021-05-01 DIAGNOSIS — E7800 Pure hypercholesterolemia, unspecified: Secondary | ICD-10-CM | POA: Diagnosis not present

## 2021-05-01 DIAGNOSIS — M8580 Other specified disorders of bone density and structure, unspecified site: Secondary | ICD-10-CM | POA: Diagnosis not present

## 2021-05-01 DIAGNOSIS — M159 Polyosteoarthritis, unspecified: Secondary | ICD-10-CM | POA: Diagnosis not present

## 2021-05-01 DIAGNOSIS — Z96641 Presence of right artificial hip joint: Secondary | ICD-10-CM | POA: Diagnosis not present

## 2021-05-05 DIAGNOSIS — M6281 Muscle weakness (generalized): Secondary | ICD-10-CM | POA: Diagnosis not present

## 2021-05-05 DIAGNOSIS — Z96641 Presence of right artificial hip joint: Secondary | ICD-10-CM | POA: Diagnosis not present

## 2021-05-05 DIAGNOSIS — M25651 Stiffness of right hip, not elsewhere classified: Secondary | ICD-10-CM | POA: Diagnosis not present

## 2021-05-08 DIAGNOSIS — M6281 Muscle weakness (generalized): Secondary | ICD-10-CM | POA: Diagnosis not present

## 2021-05-08 DIAGNOSIS — M25651 Stiffness of right hip, not elsewhere classified: Secondary | ICD-10-CM | POA: Diagnosis not present

## 2021-05-08 DIAGNOSIS — Z96641 Presence of right artificial hip joint: Secondary | ICD-10-CM | POA: Diagnosis not present

## 2021-05-11 DIAGNOSIS — M6281 Muscle weakness (generalized): Secondary | ICD-10-CM | POA: Diagnosis not present

## 2021-05-11 DIAGNOSIS — Z96641 Presence of right artificial hip joint: Secondary | ICD-10-CM | POA: Diagnosis not present

## 2021-05-11 DIAGNOSIS — M25651 Stiffness of right hip, not elsewhere classified: Secondary | ICD-10-CM | POA: Diagnosis not present

## 2021-05-13 ENCOUNTER — Ambulatory Visit: Payer: Medicare Other | Admitting: Podiatry

## 2021-05-13 ENCOUNTER — Other Ambulatory Visit: Payer: Self-pay

## 2021-05-13 ENCOUNTER — Encounter: Payer: Self-pay | Admitting: Podiatry

## 2021-05-13 DIAGNOSIS — R1313 Dysphagia, pharyngeal phase: Secondary | ICD-10-CM | POA: Insufficient documentation

## 2021-05-13 DIAGNOSIS — F329 Major depressive disorder, single episode, unspecified: Secondary | ICD-10-CM | POA: Insufficient documentation

## 2021-05-13 DIAGNOSIS — W19XXXA Unspecified fall, initial encounter: Secondary | ICD-10-CM | POA: Insufficient documentation

## 2021-05-13 DIAGNOSIS — M2010 Hallux valgus (acquired), unspecified foot: Secondary | ICD-10-CM | POA: Insufficient documentation

## 2021-05-13 DIAGNOSIS — I359 Nonrheumatic aortic valve disorder, unspecified: Secondary | ICD-10-CM | POA: Insufficient documentation

## 2021-05-13 DIAGNOSIS — R799 Abnormal finding of blood chemistry, unspecified: Secondary | ICD-10-CM | POA: Insufficient documentation

## 2021-05-13 DIAGNOSIS — K30 Functional dyspepsia: Secondary | ICD-10-CM | POA: Insufficient documentation

## 2021-05-13 DIAGNOSIS — M109 Gout, unspecified: Secondary | ICD-10-CM | POA: Insufficient documentation

## 2021-05-13 DIAGNOSIS — M2041 Other hammer toe(s) (acquired), right foot: Secondary | ICD-10-CM | POA: Diagnosis not present

## 2021-05-13 DIAGNOSIS — M5430 Sciatica, unspecified side: Secondary | ICD-10-CM | POA: Insufficient documentation

## 2021-05-13 DIAGNOSIS — G4710 Hypersomnia, unspecified: Secondary | ICD-10-CM | POA: Insufficient documentation

## 2021-05-13 DIAGNOSIS — R0789 Other chest pain: Secondary | ICD-10-CM | POA: Insufficient documentation

## 2021-05-13 DIAGNOSIS — E1149 Type 2 diabetes mellitus with other diabetic neurological complication: Secondary | ICD-10-CM | POA: Diagnosis not present

## 2021-05-13 DIAGNOSIS — K439 Ventral hernia without obstruction or gangrene: Secondary | ICD-10-CM | POA: Insufficient documentation

## 2021-05-13 DIAGNOSIS — I809 Phlebitis and thrombophlebitis of unspecified site: Secondary | ICD-10-CM | POA: Insufficient documentation

## 2021-05-13 DIAGNOSIS — R221 Localized swelling, mass and lump, neck: Secondary | ICD-10-CM | POA: Insufficient documentation

## 2021-05-13 DIAGNOSIS — K5731 Diverticulosis of large intestine without perforation or abscess with bleeding: Secondary | ICD-10-CM | POA: Insufficient documentation

## 2021-05-13 DIAGNOSIS — H04129 Dry eye syndrome of unspecified lacrimal gland: Secondary | ICD-10-CM | POA: Insufficient documentation

## 2021-05-13 DIAGNOSIS — L84 Corns and callosities: Secondary | ICD-10-CM

## 2021-05-13 DIAGNOSIS — M810 Age-related osteoporosis without current pathological fracture: Secondary | ICD-10-CM | POA: Insufficient documentation

## 2021-05-13 DIAGNOSIS — M87059 Idiopathic aseptic necrosis of unspecified femur: Secondary | ICD-10-CM | POA: Insufficient documentation

## 2021-05-13 DIAGNOSIS — R42 Dizziness and giddiness: Secondary | ICD-10-CM | POA: Insufficient documentation

## 2021-05-13 DIAGNOSIS — R739 Hyperglycemia, unspecified: Secondary | ICD-10-CM | POA: Insufficient documentation

## 2021-05-13 DIAGNOSIS — H9313 Tinnitus, bilateral: Secondary | ICD-10-CM | POA: Insufficient documentation

## 2021-05-13 DIAGNOSIS — E559 Vitamin D deficiency, unspecified: Secondary | ICD-10-CM | POA: Insufficient documentation

## 2021-05-13 DIAGNOSIS — M791 Myalgia, unspecified site: Secondary | ICD-10-CM | POA: Insufficient documentation

## 2021-05-13 DIAGNOSIS — M940 Chondrocostal junction syndrome [Tietze]: Secondary | ICD-10-CM | POA: Insufficient documentation

## 2021-05-13 DIAGNOSIS — L659 Nonscarring hair loss, unspecified: Secondary | ICD-10-CM | POA: Insufficient documentation

## 2021-05-13 DIAGNOSIS — Z96649 Presence of unspecified artificial hip joint: Secondary | ICD-10-CM | POA: Insufficient documentation

## 2021-05-13 DIAGNOSIS — J309 Allergic rhinitis, unspecified: Secondary | ICD-10-CM | POA: Insufficient documentation

## 2021-05-13 DIAGNOSIS — R413 Other amnesia: Secondary | ICD-10-CM | POA: Insufficient documentation

## 2021-05-13 DIAGNOSIS — R5383 Other fatigue: Secondary | ICD-10-CM | POA: Insufficient documentation

## 2021-05-13 DIAGNOSIS — K219 Gastro-esophageal reflux disease without esophagitis: Secondary | ICD-10-CM | POA: Insufficient documentation

## 2021-05-13 DIAGNOSIS — E1140 Type 2 diabetes mellitus with diabetic neuropathy, unspecified: Secondary | ICD-10-CM

## 2021-05-13 DIAGNOSIS — K5900 Constipation, unspecified: Secondary | ICD-10-CM | POA: Insufficient documentation

## 2021-05-13 DIAGNOSIS — R030 Elevated blood-pressure reading, without diagnosis of hypertension: Secondary | ICD-10-CM | POA: Insufficient documentation

## 2021-05-13 DIAGNOSIS — R1310 Dysphagia, unspecified: Secondary | ICD-10-CM | POA: Insufficient documentation

## 2021-05-13 DIAGNOSIS — G4733 Obstructive sleep apnea (adult) (pediatric): Secondary | ICD-10-CM | POA: Insufficient documentation

## 2021-05-13 DIAGNOSIS — Z79899 Other long term (current) drug therapy: Secondary | ICD-10-CM | POA: Insufficient documentation

## 2021-05-13 DIAGNOSIS — M179 Osteoarthritis of knee, unspecified: Secondary | ICD-10-CM | POA: Insufficient documentation

## 2021-05-13 DIAGNOSIS — R61 Generalized hyperhidrosis: Secondary | ICD-10-CM | POA: Insufficient documentation

## 2021-05-13 DIAGNOSIS — J9801 Acute bronchospasm: Secondary | ICD-10-CM | POA: Insufficient documentation

## 2021-05-13 DIAGNOSIS — R0609 Other forms of dyspnea: Secondary | ICD-10-CM | POA: Insufficient documentation

## 2021-05-13 DIAGNOSIS — N183 Chronic kidney disease, stage 3 unspecified: Secondary | ICD-10-CM | POA: Insufficient documentation

## 2021-05-13 DIAGNOSIS — N1832 Chronic kidney disease, stage 3b: Secondary | ICD-10-CM | POA: Insufficient documentation

## 2021-05-13 DIAGNOSIS — N899 Noninflammatory disorder of vagina, unspecified: Secondary | ICD-10-CM | POA: Insufficient documentation

## 2021-05-13 DIAGNOSIS — L509 Urticaria, unspecified: Secondary | ICD-10-CM | POA: Insufficient documentation

## 2021-05-13 DIAGNOSIS — K921 Melena: Secondary | ICD-10-CM | POA: Insufficient documentation

## 2021-05-13 DIAGNOSIS — M79609 Pain in unspecified limb: Secondary | ICD-10-CM | POA: Insufficient documentation

## 2021-05-13 DIAGNOSIS — R109 Unspecified abdominal pain: Secondary | ICD-10-CM | POA: Insufficient documentation

## 2021-05-13 DIAGNOSIS — J449 Chronic obstructive pulmonary disease, unspecified: Secondary | ICD-10-CM | POA: Insufficient documentation

## 2021-05-13 DIAGNOSIS — R0681 Apnea, not elsewhere classified: Secondary | ICD-10-CM | POA: Insufficient documentation

## 2021-05-13 DIAGNOSIS — L439 Lichen planus, unspecified: Secondary | ICD-10-CM | POA: Insufficient documentation

## 2021-05-13 DIAGNOSIS — D72829 Elevated white blood cell count, unspecified: Secondary | ICD-10-CM | POA: Insufficient documentation

## 2021-05-13 DIAGNOSIS — F419 Anxiety disorder, unspecified: Secondary | ICD-10-CM | POA: Insufficient documentation

## 2021-05-13 DIAGNOSIS — G479 Sleep disorder, unspecified: Secondary | ICD-10-CM | POA: Insufficient documentation

## 2021-05-13 DIAGNOSIS — M3500 Sicca syndrome, unspecified: Secondary | ICD-10-CM | POA: Insufficient documentation

## 2021-05-13 DIAGNOSIS — H8303 Labyrinthitis, bilateral: Secondary | ICD-10-CM | POA: Insufficient documentation

## 2021-05-13 DIAGNOSIS — N390 Urinary tract infection, site not specified: Secondary | ICD-10-CM | POA: Insufficient documentation

## 2021-05-13 DIAGNOSIS — H8110 Benign paroxysmal vertigo, unspecified ear: Secondary | ICD-10-CM | POA: Insufficient documentation

## 2021-05-13 HISTORY — DX: Vitamin D deficiency, unspecified: E55.9

## 2021-05-13 NOTE — Progress Notes (Signed)
Subjective:   Patient ID: Christina Sellers, female   DOB: 80 y.o.   MRN: [id number]   HPI Patient concerned about lesion formation left and also digital deformity of the right hallux and concerned about the formation of hammertoe digital type deformities   ROS      Objective:  Physical Exam  Neurovascular status unchanged with patient has been in the hospital numerous times recently with keratotic lesions plantar left and mild lateral deviation of the right big toe against the second toe     Assessment:  Chronic keratotic lesions left with pain along with mild hammertoe deformity     Plan:  H&P reviewed both conditions separately and sterile debridement of lesions left no iatrogenic bleeding and for the right I discussed wider shoes and stretching exercises.  Patient will be seen back as needed routine care

## 2021-05-15 DIAGNOSIS — Z96641 Presence of right artificial hip joint: Secondary | ICD-10-CM | POA: Diagnosis not present

## 2021-05-15 DIAGNOSIS — M25651 Stiffness of right hip, not elsewhere classified: Secondary | ICD-10-CM | POA: Diagnosis not present

## 2021-05-15 DIAGNOSIS — M6281 Muscle weakness (generalized): Secondary | ICD-10-CM | POA: Diagnosis not present

## 2021-05-18 DIAGNOSIS — Z96641 Presence of right artificial hip joint: Secondary | ICD-10-CM | POA: Diagnosis not present

## 2021-05-18 DIAGNOSIS — M6281 Muscle weakness (generalized): Secondary | ICD-10-CM | POA: Diagnosis not present

## 2021-05-18 DIAGNOSIS — M25651 Stiffness of right hip, not elsewhere classified: Secondary | ICD-10-CM | POA: Diagnosis not present

## 2021-05-20 DIAGNOSIS — E1136 Type 2 diabetes mellitus with diabetic cataract: Secondary | ICD-10-CM | POA: Diagnosis not present

## 2021-05-20 DIAGNOSIS — L659 Nonscarring hair loss, unspecified: Secondary | ICD-10-CM | POA: Diagnosis not present

## 2021-05-20 DIAGNOSIS — N183 Chronic kidney disease, stage 3 unspecified: Secondary | ICD-10-CM | POA: Diagnosis not present

## 2021-05-20 DIAGNOSIS — J449 Chronic obstructive pulmonary disease, unspecified: Secondary | ICD-10-CM | POA: Diagnosis not present

## 2021-05-20 DIAGNOSIS — R197 Diarrhea, unspecified: Secondary | ICD-10-CM | POA: Diagnosis not present

## 2021-05-20 DIAGNOSIS — Z96649 Presence of unspecified artificial hip joint: Secondary | ICD-10-CM | POA: Diagnosis not present

## 2021-05-20 DIAGNOSIS — I10 Essential (primary) hypertension: Secondary | ICD-10-CM | POA: Diagnosis not present

## 2021-05-20 DIAGNOSIS — M545 Low back pain, unspecified: Secondary | ICD-10-CM | POA: Diagnosis not present

## 2021-05-20 DIAGNOSIS — E039 Hypothyroidism, unspecified: Secondary | ICD-10-CM | POA: Diagnosis not present

## 2021-05-20 DIAGNOSIS — I359 Nonrheumatic aortic valve disorder, unspecified: Secondary | ICD-10-CM | POA: Diagnosis not present

## 2021-05-22 DIAGNOSIS — M25651 Stiffness of right hip, not elsewhere classified: Secondary | ICD-10-CM | POA: Diagnosis not present

## 2021-05-22 DIAGNOSIS — M6281 Muscle weakness (generalized): Secondary | ICD-10-CM | POA: Diagnosis not present

## 2021-05-22 DIAGNOSIS — Z96641 Presence of right artificial hip joint: Secondary | ICD-10-CM | POA: Diagnosis not present

## 2021-06-01 DIAGNOSIS — E039 Hypothyroidism, unspecified: Secondary | ICD-10-CM | POA: Diagnosis not present

## 2021-06-01 DIAGNOSIS — E7800 Pure hypercholesterolemia, unspecified: Secondary | ICD-10-CM | POA: Diagnosis not present

## 2021-06-01 DIAGNOSIS — K219 Gastro-esophageal reflux disease without esophagitis: Secondary | ICD-10-CM | POA: Diagnosis not present

## 2021-06-01 DIAGNOSIS — M810 Age-related osteoporosis without current pathological fracture: Secondary | ICD-10-CM | POA: Diagnosis not present

## 2021-06-01 DIAGNOSIS — M25651 Stiffness of right hip, not elsewhere classified: Secondary | ICD-10-CM | POA: Diagnosis not present

## 2021-06-01 DIAGNOSIS — N183 Chronic kidney disease, stage 3 unspecified: Secondary | ICD-10-CM | POA: Diagnosis not present

## 2021-06-01 DIAGNOSIS — M159 Polyosteoarthritis, unspecified: Secondary | ICD-10-CM | POA: Diagnosis not present

## 2021-06-01 DIAGNOSIS — M8580 Other specified disorders of bone density and structure, unspecified site: Secondary | ICD-10-CM | POA: Diagnosis not present

## 2021-06-01 DIAGNOSIS — J449 Chronic obstructive pulmonary disease, unspecified: Secondary | ICD-10-CM | POA: Diagnosis not present

## 2021-06-01 DIAGNOSIS — M6281 Muscle weakness (generalized): Secondary | ICD-10-CM | POA: Diagnosis not present

## 2021-06-01 DIAGNOSIS — Z96641 Presence of right artificial hip joint: Secondary | ICD-10-CM | POA: Diagnosis not present

## 2021-06-01 DIAGNOSIS — I10 Essential (primary) hypertension: Secondary | ICD-10-CM | POA: Diagnosis not present

## 2021-06-01 DIAGNOSIS — E1136 Type 2 diabetes mellitus with diabetic cataract: Secondary | ICD-10-CM | POA: Diagnosis not present

## 2021-06-05 DIAGNOSIS — M6281 Muscle weakness (generalized): Secondary | ICD-10-CM | POA: Diagnosis not present

## 2021-06-05 DIAGNOSIS — Z96641 Presence of right artificial hip joint: Secondary | ICD-10-CM | POA: Diagnosis not present

## 2021-06-05 DIAGNOSIS — M25651 Stiffness of right hip, not elsewhere classified: Secondary | ICD-10-CM | POA: Diagnosis not present

## 2021-06-12 DIAGNOSIS — Z96641 Presence of right artificial hip joint: Secondary | ICD-10-CM | POA: Diagnosis not present

## 2021-06-12 DIAGNOSIS — M25651 Stiffness of right hip, not elsewhere classified: Secondary | ICD-10-CM | POA: Diagnosis not present

## 2021-06-12 DIAGNOSIS — M6281 Muscle weakness (generalized): Secondary | ICD-10-CM | POA: Diagnosis not present

## 2021-06-15 DIAGNOSIS — Z96641 Presence of right artificial hip joint: Secondary | ICD-10-CM | POA: Diagnosis not present

## 2021-06-15 DIAGNOSIS — M6281 Muscle weakness (generalized): Secondary | ICD-10-CM | POA: Diagnosis not present

## 2021-06-15 DIAGNOSIS — M25651 Stiffness of right hip, not elsewhere classified: Secondary | ICD-10-CM | POA: Diagnosis not present

## 2021-06-17 DIAGNOSIS — J449 Chronic obstructive pulmonary disease, unspecified: Secondary | ICD-10-CM | POA: Diagnosis not present

## 2021-06-17 DIAGNOSIS — M810 Age-related osteoporosis without current pathological fracture: Secondary | ICD-10-CM | POA: Diagnosis not present

## 2021-06-17 DIAGNOSIS — G4733 Obstructive sleep apnea (adult) (pediatric): Secondary | ICD-10-CM | POA: Diagnosis not present

## 2021-06-17 DIAGNOSIS — R600 Localized edema: Secondary | ICD-10-CM | POA: Diagnosis not present

## 2021-06-17 DIAGNOSIS — I700 Atherosclerosis of aorta: Secondary | ICD-10-CM | POA: Diagnosis not present

## 2021-06-17 DIAGNOSIS — N183 Chronic kidney disease, stage 3 unspecified: Secondary | ICD-10-CM | POA: Diagnosis not present

## 2021-06-17 DIAGNOSIS — R739 Hyperglycemia, unspecified: Secondary | ICD-10-CM | POA: Diagnosis not present

## 2021-06-19 DIAGNOSIS — M6281 Muscle weakness (generalized): Secondary | ICD-10-CM | POA: Diagnosis not present

## 2021-06-19 DIAGNOSIS — Z96641 Presence of right artificial hip joint: Secondary | ICD-10-CM | POA: Diagnosis not present

## 2021-06-19 DIAGNOSIS — M25651 Stiffness of right hip, not elsewhere classified: Secondary | ICD-10-CM | POA: Diagnosis not present

## 2021-06-22 DIAGNOSIS — M6281 Muscle weakness (generalized): Secondary | ICD-10-CM | POA: Diagnosis not present

## 2021-06-22 DIAGNOSIS — M25651 Stiffness of right hip, not elsewhere classified: Secondary | ICD-10-CM | POA: Diagnosis not present

## 2021-06-22 DIAGNOSIS — Z96641 Presence of right artificial hip joint: Secondary | ICD-10-CM | POA: Diagnosis not present

## 2021-06-23 DIAGNOSIS — R600 Localized edema: Secondary | ICD-10-CM | POA: Diagnosis not present

## 2021-06-23 DIAGNOSIS — M810 Age-related osteoporosis without current pathological fracture: Secondary | ICD-10-CM | POA: Diagnosis not present

## 2021-06-23 DIAGNOSIS — E039 Hypothyroidism, unspecified: Secondary | ICD-10-CM | POA: Diagnosis not present

## 2021-06-23 DIAGNOSIS — R7309 Other abnormal glucose: Secondary | ICD-10-CM | POA: Diagnosis not present

## 2021-06-23 DIAGNOSIS — I10 Essential (primary) hypertension: Secondary | ICD-10-CM | POA: Diagnosis not present

## 2021-06-23 DIAGNOSIS — N183 Chronic kidney disease, stage 3 unspecified: Secondary | ICD-10-CM | POA: Diagnosis not present

## 2021-06-23 DIAGNOSIS — R739 Hyperglycemia, unspecified: Secondary | ICD-10-CM | POA: Diagnosis not present

## 2021-06-25 DIAGNOSIS — H02052 Trichiasis without entropian right lower eyelid: Secondary | ICD-10-CM | POA: Diagnosis not present

## 2021-06-25 DIAGNOSIS — H02054 Trichiasis without entropian left upper eyelid: Secondary | ICD-10-CM | POA: Diagnosis not present

## 2021-06-25 DIAGNOSIS — H02051 Trichiasis without entropian right upper eyelid: Secondary | ICD-10-CM | POA: Diagnosis not present

## 2021-06-25 DIAGNOSIS — H18833 Recurrent erosion of cornea, bilateral: Secondary | ICD-10-CM | POA: Diagnosis not present

## 2021-06-26 DIAGNOSIS — M25651 Stiffness of right hip, not elsewhere classified: Secondary | ICD-10-CM | POA: Diagnosis not present

## 2021-06-26 DIAGNOSIS — Z96641 Presence of right artificial hip joint: Secondary | ICD-10-CM | POA: Diagnosis not present

## 2021-06-26 DIAGNOSIS — M6281 Muscle weakness (generalized): Secondary | ICD-10-CM | POA: Diagnosis not present

## 2021-06-29 DIAGNOSIS — M6281 Muscle weakness (generalized): Secondary | ICD-10-CM | POA: Diagnosis not present

## 2021-06-29 DIAGNOSIS — M25651 Stiffness of right hip, not elsewhere classified: Secondary | ICD-10-CM | POA: Diagnosis not present

## 2021-06-29 DIAGNOSIS — Z96641 Presence of right artificial hip joint: Secondary | ICD-10-CM | POA: Diagnosis not present

## 2021-06-30 DIAGNOSIS — Z96641 Presence of right artificial hip joint: Secondary | ICD-10-CM | POA: Diagnosis not present

## 2021-06-30 DIAGNOSIS — Z09 Encounter for follow-up examination after completed treatment for conditions other than malignant neoplasm: Secondary | ICD-10-CM | POA: Diagnosis not present

## 2021-06-30 DIAGNOSIS — M25551 Pain in right hip: Secondary | ICD-10-CM | POA: Diagnosis not present

## 2021-07-06 DIAGNOSIS — Z96641 Presence of right artificial hip joint: Secondary | ICD-10-CM | POA: Diagnosis not present

## 2021-07-06 DIAGNOSIS — M25651 Stiffness of right hip, not elsewhere classified: Secondary | ICD-10-CM | POA: Diagnosis not present

## 2021-07-06 DIAGNOSIS — M6281 Muscle weakness (generalized): Secondary | ICD-10-CM | POA: Diagnosis not present

## 2021-07-10 DIAGNOSIS — M25651 Stiffness of right hip, not elsewhere classified: Secondary | ICD-10-CM | POA: Diagnosis not present

## 2021-07-10 DIAGNOSIS — Z96641 Presence of right artificial hip joint: Secondary | ICD-10-CM | POA: Diagnosis not present

## 2021-07-10 DIAGNOSIS — M6281 Muscle weakness (generalized): Secondary | ICD-10-CM | POA: Diagnosis not present

## 2021-07-13 DIAGNOSIS — Z96641 Presence of right artificial hip joint: Secondary | ICD-10-CM | POA: Diagnosis not present

## 2021-07-13 DIAGNOSIS — M25651 Stiffness of right hip, not elsewhere classified: Secondary | ICD-10-CM | POA: Diagnosis not present

## 2021-07-13 DIAGNOSIS — M6281 Muscle weakness (generalized): Secondary | ICD-10-CM | POA: Diagnosis not present

## 2021-07-17 DIAGNOSIS — M25651 Stiffness of right hip, not elsewhere classified: Secondary | ICD-10-CM | POA: Diagnosis not present

## 2021-07-17 DIAGNOSIS — M6281 Muscle weakness (generalized): Secondary | ICD-10-CM | POA: Diagnosis not present

## 2021-07-17 DIAGNOSIS — Z96641 Presence of right artificial hip joint: Secondary | ICD-10-CM | POA: Diagnosis not present

## 2021-07-20 DIAGNOSIS — Z96641 Presence of right artificial hip joint: Secondary | ICD-10-CM | POA: Diagnosis not present

## 2021-07-20 DIAGNOSIS — M6281 Muscle weakness (generalized): Secondary | ICD-10-CM | POA: Diagnosis not present

## 2021-07-20 DIAGNOSIS — M25651 Stiffness of right hip, not elsewhere classified: Secondary | ICD-10-CM | POA: Diagnosis not present

## 2021-07-24 DIAGNOSIS — M25651 Stiffness of right hip, not elsewhere classified: Secondary | ICD-10-CM | POA: Diagnosis not present

## 2021-07-24 DIAGNOSIS — M6281 Muscle weakness (generalized): Secondary | ICD-10-CM | POA: Diagnosis not present

## 2021-07-24 DIAGNOSIS — Z96641 Presence of right artificial hip joint: Secondary | ICD-10-CM | POA: Diagnosis not present

## 2021-07-27 DIAGNOSIS — Z96641 Presence of right artificial hip joint: Secondary | ICD-10-CM | POA: Diagnosis not present

## 2021-07-27 DIAGNOSIS — M6281 Muscle weakness (generalized): Secondary | ICD-10-CM | POA: Diagnosis not present

## 2021-07-27 DIAGNOSIS — M25651 Stiffness of right hip, not elsewhere classified: Secondary | ICD-10-CM | POA: Diagnosis not present

## 2021-07-28 DIAGNOSIS — L509 Urticaria, unspecified: Secondary | ICD-10-CM | POA: Diagnosis not present

## 2021-07-28 DIAGNOSIS — R600 Localized edema: Secondary | ICD-10-CM | POA: Diagnosis not present

## 2021-07-28 DIAGNOSIS — I10 Essential (primary) hypertension: Secondary | ICD-10-CM | POA: Diagnosis not present

## 2021-07-29 DIAGNOSIS — M545 Low back pain, unspecified: Secondary | ICD-10-CM | POA: Diagnosis not present

## 2021-07-29 DIAGNOSIS — M79642 Pain in left hand: Secondary | ICD-10-CM | POA: Diagnosis not present

## 2021-07-29 DIAGNOSIS — M3500 Sicca syndrome, unspecified: Secondary | ICD-10-CM | POA: Diagnosis not present

## 2021-07-29 DIAGNOSIS — M79641 Pain in right hand: Secondary | ICD-10-CM | POA: Diagnosis not present

## 2021-07-30 DIAGNOSIS — M79642 Pain in left hand: Secondary | ICD-10-CM | POA: Diagnosis not present

## 2021-07-30 DIAGNOSIS — M3500 Sicca syndrome, unspecified: Secondary | ICD-10-CM | POA: Diagnosis not present

## 2021-07-30 DIAGNOSIS — M79641 Pain in right hand: Secondary | ICD-10-CM | POA: Diagnosis not present

## 2021-07-31 DIAGNOSIS — M25651 Stiffness of right hip, not elsewhere classified: Secondary | ICD-10-CM | POA: Diagnosis not present

## 2021-07-31 DIAGNOSIS — M6281 Muscle weakness (generalized): Secondary | ICD-10-CM | POA: Diagnosis not present

## 2021-07-31 DIAGNOSIS — Z96641 Presence of right artificial hip joint: Secondary | ICD-10-CM | POA: Diagnosis not present

## 2021-08-03 DIAGNOSIS — M6281 Muscle weakness (generalized): Secondary | ICD-10-CM | POA: Diagnosis not present

## 2021-08-03 DIAGNOSIS — Z96641 Presence of right artificial hip joint: Secondary | ICD-10-CM | POA: Diagnosis not present

## 2021-08-03 DIAGNOSIS — M25651 Stiffness of right hip, not elsewhere classified: Secondary | ICD-10-CM | POA: Diagnosis not present

## 2021-08-07 DIAGNOSIS — Z96641 Presence of right artificial hip joint: Secondary | ICD-10-CM | POA: Diagnosis not present

## 2021-08-07 DIAGNOSIS — M25651 Stiffness of right hip, not elsewhere classified: Secondary | ICD-10-CM | POA: Diagnosis not present

## 2021-08-07 DIAGNOSIS — M6281 Muscle weakness (generalized): Secondary | ICD-10-CM | POA: Diagnosis not present

## 2021-08-11 DIAGNOSIS — M6281 Muscle weakness (generalized): Secondary | ICD-10-CM | POA: Diagnosis not present

## 2021-08-11 DIAGNOSIS — M25651 Stiffness of right hip, not elsewhere classified: Secondary | ICD-10-CM | POA: Diagnosis not present

## 2021-08-11 DIAGNOSIS — Z96641 Presence of right artificial hip joint: Secondary | ICD-10-CM | POA: Diagnosis not present

## 2021-08-14 DIAGNOSIS — Z96641 Presence of right artificial hip joint: Secondary | ICD-10-CM | POA: Diagnosis not present

## 2021-08-14 DIAGNOSIS — M6281 Muscle weakness (generalized): Secondary | ICD-10-CM | POA: Diagnosis not present

## 2021-08-14 DIAGNOSIS — M25651 Stiffness of right hip, not elsewhere classified: Secondary | ICD-10-CM | POA: Diagnosis not present

## 2021-08-25 DIAGNOSIS — M25651 Stiffness of right hip, not elsewhere classified: Secondary | ICD-10-CM | POA: Diagnosis not present

## 2021-08-25 DIAGNOSIS — M6281 Muscle weakness (generalized): Secondary | ICD-10-CM | POA: Diagnosis not present

## 2021-08-25 DIAGNOSIS — Z96641 Presence of right artificial hip joint: Secondary | ICD-10-CM | POA: Diagnosis not present

## 2021-08-26 DIAGNOSIS — R739 Hyperglycemia, unspecified: Secondary | ICD-10-CM | POA: Diagnosis not present

## 2021-08-26 DIAGNOSIS — E039 Hypothyroidism, unspecified: Secondary | ICD-10-CM | POA: Diagnosis not present

## 2021-08-26 DIAGNOSIS — R7309 Other abnormal glucose: Secondary | ICD-10-CM | POA: Diagnosis not present

## 2021-08-26 DIAGNOSIS — I10 Essential (primary) hypertension: Secondary | ICD-10-CM | POA: Diagnosis not present

## 2021-08-26 DIAGNOSIS — R600 Localized edema: Secondary | ICD-10-CM | POA: Diagnosis not present

## 2021-08-26 DIAGNOSIS — N183 Chronic kidney disease, stage 3 unspecified: Secondary | ICD-10-CM | POA: Diagnosis not present

## 2021-09-01 DIAGNOSIS — M25651 Stiffness of right hip, not elsewhere classified: Secondary | ICD-10-CM | POA: Diagnosis not present

## 2021-09-01 DIAGNOSIS — Z96641 Presence of right artificial hip joint: Secondary | ICD-10-CM | POA: Diagnosis not present

## 2021-09-01 DIAGNOSIS — M6281 Muscle weakness (generalized): Secondary | ICD-10-CM | POA: Diagnosis not present

## 2021-09-04 DIAGNOSIS — Z96641 Presence of right artificial hip joint: Secondary | ICD-10-CM | POA: Diagnosis not present

## 2021-09-04 DIAGNOSIS — M6281 Muscle weakness (generalized): Secondary | ICD-10-CM | POA: Diagnosis not present

## 2021-09-04 DIAGNOSIS — M25651 Stiffness of right hip, not elsewhere classified: Secondary | ICD-10-CM | POA: Diagnosis not present

## 2021-09-11 DIAGNOSIS — M6281 Muscle weakness (generalized): Secondary | ICD-10-CM | POA: Diagnosis not present

## 2021-09-11 DIAGNOSIS — M25651 Stiffness of right hip, not elsewhere classified: Secondary | ICD-10-CM | POA: Diagnosis not present

## 2021-09-11 DIAGNOSIS — Z96641 Presence of right artificial hip joint: Secondary | ICD-10-CM | POA: Diagnosis not present

## 2021-09-15 DIAGNOSIS — M6281 Muscle weakness (generalized): Secondary | ICD-10-CM | POA: Diagnosis not present

## 2021-09-15 DIAGNOSIS — M25651 Stiffness of right hip, not elsewhere classified: Secondary | ICD-10-CM | POA: Diagnosis not present

## 2021-09-15 DIAGNOSIS — Z96641 Presence of right artificial hip joint: Secondary | ICD-10-CM | POA: Diagnosis not present

## 2021-09-20 NOTE — Progress Notes (Signed)
Cardiology Office Note:    Date:  09/21/2021   ID:  Christina Sellers, DOB June 15, 1941, MRN [id number]  PCP:  Josetta Huddle, MD  Provider Notes:  Hearing Impaired   [place]  Cardiologist:  Werner Lean, MD  Advanced Practice Provider:  No care team member to display Electrophysiologist:  None      CC: Follow up PAD  History of Present Illness:    Christina Sellers is a 80 y.o. female with a hx of PAD (CAS), hx of COPD with Asthma, HLD with T2DM with aortic atherosclerosis, HTN with DM who presents for evaluation 01/21/21. In interim of this visit, patient had syncope issues including syncope during an office visit.  Seen in the hospital during that admission and her diuretics and BB were stopped due to intolerance.  Seen 02/27/21.  In interim of this visit, patient had right hip surgery 03/30/21. In interim of this visit, patient notes that she has had issues with near syncope.  See 09/21/21  We have reviewed her prior near syncope history.  Felt so much better now that she has been off her thiazide and CCB.  Has had no further syncope.  Did well with the hip surgery.  Her hip pain is improved and going through PT; this is still a challenge  Ambulatory blood pressure monitoring:  variable but has had a blood pressure as high as 180 at home.  Two months age had needling stabbing chest pain but has had none in the past month.  Does chair yoga and PT without symptoms.  Past Medical History:  Diagnosis Date   Abnormal Pap smear of vagina    Allergic sinusitis    Anxiety    Atherosclerotic peripheral vascular disease ([place])    Carotid artery stenosis    Cellulitis    COPD with asthma ([place])    not an issue now   Depression    Diabetes mellitus type 2, uncomplicated (HCC)    DJD (degenerative joint disease)    Elevated cholesterol    Elevated MCV    secondary to alchol   GERD (gastroesophageal reflux disease)    not an issue now.   Gout     Hearing difficulty    bilateral hearing aids   History of recurrent UTIs    Hypertension    loss weight'off meds now"   Hypothyroidism    IBS (irritable bowel syndrome)    Insomnia    Labial fusion    Osteoarthritis of left knee    Osteopenia    Recurrent vaginitis    Sjogren's syndrome ([place])    Syncope 11.7.14   secondary to dehydration and possible hypoglycemia   Tendonitis    Thyroid disease    Transfusion history    age 89 "anemia"    Past Surgical History:  Procedure Laterality Date   CATARACT EXTRACTION, BILATERAL Bilateral    CHOLECYSTECTOMY     open   COLONOSCOPY WITH PROPOFOL N/A 03/23/2016   Procedure: COLONOSCOPY WITH PROPOFOL;  Surgeon: Garlan Fair, MD;  Location: [facility];  Service: Endoscopy;  Laterality: N/A;   EYE SURGERY     FOOT SURGERY Left    retained hardware   KNEE SURGERY Left    scope    TONSILLECTOMY     TOTAL HIP ARTHROPLASTY Right 03/30/2021   Procedure: RIGHTTOTAL HIP ARTHROPLASTY ANTERIOR APPROACH;  Surgeon: Frederik Pear, MD;  Location: [facility];  Service: Orthopedics;  Laterality: Right;    Current Medications: Current Meds  Medication Sig   ascorbic acid (VITAMIN C) 500 MG tablet Take 500 mg by mouth daily.   aspirin EC 81 MG tablet Take 1 tablet (81 mg total) by mouth 2 (two) times daily.   blood glucose meter kit and supplies KIT Dispense based on patient and insurance preference. Use up to four times daily as directed.   Cholecalciferol (VITAMIN D3) 50 MCG (2000 UT) capsule Take 2,000 mg by mouth daily.   clobetasol ointment (TEMOVATE) 0.05 % Apply externally daily x 2 weeks, then decrease to every other day x 2 weeks, then twice weekly   denosumab (PROLIA) 60 MG/ML SOSY injection Inject 60 mg into the skin every 6 (six) months.   diclofenac sodium (VOLTAREN) 1 % GEL Apply 1 application topically 2 (two) times daily as needed (to painful sites).   feeding supplement (ENSURE ENLIVE / ENSURE PLUS) LIQD Take 237 mLs by mouth 2 (two)  times daily between meals.   finasteride (PROSCAR) 5 MG tablet Take 2.5 mg by mouth daily.   fluticasone (FLONASE) 50 MCG/ACT nasal spray Place 1 spray into both nostrils daily as needed (for seasonal allergies).   hydrALAZINE (APRESOLINE) 10 MG tablet As need for SBP > 160   hypromellose (SYSTANE OVERNIGHT THERAPY) 0.3 % GEL ophthalmic ointment Place 1 application into both eyes at bedtime as needed for dry eyes.   levothyroxine (SYNTHROID) 75 MCG tablet Take 75 mcg by mouth every morning.   Menthol, Topical Analgesic, (BIOFREEZE EX) Apply 1 application topically 2 (two) times daily as needed (for knee pain).   methocarbamol (ROBAXIN) 500 MG tablet Take 250 mg by mouth 3 (three) times daily as needed for muscle spasms (or back pain).   Methylcobalamin 1000 MCG TBDP Take 1,000 mcg by mouth daily.   Multiple Vitamins-Minerals (MULTIVITAMIN WITH MINERALS) tablet Take 1 tablet by mouth daily.   nystatin cream (MYCOSTATIN) Apply 1 application topically 2 (two) times daily.   OVER THE COUNTER MEDICATION Take 2 capsules by mouth daily. Hydro Eye   potassium chloride SA (KLOR-CON) 20 MEQ tablet Take 1 tablet (20 mEq total) by mouth daily.   Psyllium (METAMUCIL PO) Take 1 Scoop by mouth at bedtime as needed (constipation- mix and drink as directed).   RESTASIS 0.05 % ophthalmic emulsion Place 1 drop into both eyes 2 (two) times daily.   rosuvastatin (CRESTOR) 5 MG tablet Take 5 mg by mouth daily.   vitamin E 200 UNIT capsule Take 200 Units by mouth daily.     Allergies:   Tape, Celexa [citalopram hydrobromide], Fluconazole, Green tea leaf ext, Kenalog [triamcinolone], Levaquin [levofloxacin], Losartan potassium-hctz, Magnesium citrate, Methocarbamol, Nitrofurantoin macrocrystal, Scopolamine, Sulfa antibiotics, and Trimethoprim   Social History   Socioeconomic History   Marital status: Widowed    Spouse name: Not on file   Number of children: Not on file   Years of education: Not on file   Highest  education level: Not on file  Occupational History   Not on file  Tobacco Use   Smoking status: Former    Types: Cigarettes    Quit date: 02/21/1982    Years since quitting: 39.6   Smokeless tobacco: Never  Vaping Use   Vaping Use: Never used  Substance and Sexual Activity   Alcohol use: No   Drug use: No   Sexual activity: Never    Birth control/protection: Post-menopausal  Other Topics Concern   Not on file  Social History Narrative   Not on file   Social Determinants of Health  Financial Resource Strain: Not on file  Food Insecurity: Not on file  Transportation Needs: Not on file  Physical Activity: Not on file  Stress: Not on file  Social Connections: Not on file    Social:  Daughter is in [place] and had two kids' she has multiple family members one who graduated dental school (grandson in [place])  Family History: The patient's family history includes Breast cancer in her sister; Cancer in her brother and father; Diabetes in her maternal aunt.  ROS:   Please see the history of present illness.  Persistent hip pain  All other systems reviewed and are negative.  EKGs/Labs/Other Studies Reviewed:    The following studies were reviewed today:  EKG:   01/21/21: SR 65 WNL  Transthoracic Echocardiogram: Date: 02/02/21 Results:  1. Left ventricular ejection fraction, by estimation, is 65 to 70%. The  left ventricle has normal function. The left ventricle has no regional  wall motion abnormalities. Left ventricular diastolic parameters are  consistent with Grade I diastolic  dysfunction (impaired relaxation).   2. Right ventricular systolic function is normal. The right ventricular  size is mildly enlarged. There is normal pulmonary artery systolic  pressure.   3. Left atrial size was mild to moderately dilated.   4. The mitral valve is normal in structure. Mild mitral valve  regurgitation. No evidence of mitral stenosis. Moderate mitral annular   calcification.   5. Tricuspid valve regurgitation is mild to moderate.   6. The aortic valve is normal in structure. Aortic valve regurgitation is  not visualized. No aortic stenosis is present.   7. The inferior vena cava is normal in size with greater than 50%  respiratory variability, suggesting right atrial pressure of 3 mmHg.  NM Stress Testing : Date: 04/14/2018 Results: Nuclear stress EF: 77%. The left ventricular ejection fraction is hyperdynamic (>65%). There was no ST segment deviation noted during stress. The study is normal. This is a low risk study.   Normal pharmacologic nuclear stress test with no evidence for prior infarct or ischemia.  Hyperdynamic LVEF.  Carotid Duplex: Date:02/14/21 Results: Summary:  Right Carotid: Velocities in the right ICA are consistent with a 40-59%                 stenosis.   Left Carotid: Velocities in the left ICA are consistent with a 1-39%  stenosis.   Vertebrals:  Right vertebral artery demonstrates antegrade flow. Left  vertebral               artery demonstrates bidirectional flow.  Subclavians: Normal flow hemodynamics were seen in bilateral subclavian               arteries.   LE Dupelx Venous: Date: 02/15/21 Results: Summary:  BILATERAL:  - No evidence of deep vein thrombosis seen in the lower extremities,  bilaterally.  -No evidence of popliteal cyst, bilaterally.  RIGHT:  - No cystic structure found in the popliteal fossa.     LEFT:  - No cystic structure found in the popliteal fossa.    Recent Labs: 01/21/2021: NT-Pro BNP 551 02/01/2021: B Natriuretic Peptide 139.0 02/11/2021: ALT 17; TSH 1.151 02/15/2021: Magnesium 1.9 03/31/2021: BUN 14; Creatinine, Ser 0.82; Potassium 4.2; Sodium 136 04/02/2021: Hemoglobin 9.2; Platelets 154  Recent Lipid Panel    Component Value Date/Time   CHOL 149 08/26/2011 0400   TRIG 195 (H) 08/26/2011 0400   HDL 37 (L) 08/26/2011 0400   CHOLHDL 4.0 08/26/2011 0400   VLDL 39  08/26/2011 0400   LDLCALC 73 08/26/2011 0400    Physical Exam:    VS:  BP (!) 180/68   Pulse 68   Ht _0  (1.473 m)   Wt 130 lb (59 kg)   SpO2 98%   BMI 27.17 kg/m     Wt Readings from Last 3 Encounters:  09/21/21 130 lb (59 kg)  03/30/21 129 lb 10.1 oz (58.8 kg)  03/23/21 129 lb 9.6 oz (58.8 kg)    GEN:  Well nourished, well developed in no acute distress HEENT: Normal NECK: No JVD LYMPHATICS: No lymphadenopathy  CARDIAC: RRR, systolic murmur II/VI  RESPIRATORY:  Clear to auscultation without rales, wheezing or rhonchi  ABDOMEN: Soft, non-tender, non-distended MUSCULOSKELETAL:  non-pitting edema; No deformity  SKIN: Warm and dry NEUROLOGIC:  Alert and oriented x 3 with wide based gate PSYCHIATRIC:  Normal affect   ASSESSMENT:    1. Hypertension associated with diabetes ([place])   2. Hyperlipidemia associated with type 2 diabetes mellitus ([place])   3. Orthostatic hypotension   4. Aortic atherosclerosis ([place])   5. Bilateral carotid artery stenosis   6. Aortic valve disorder     PLAN:    Diabetes with hypertension With Orthostatic Hypotension - ambulatory blood pressure not done consistently- will restart - has been lab and she had syncope with HCTZ and CCB - Will start with hydralazine 10 mg PO BID for SBP over 160; patient will call in with amb BP; I suspect we will need a higher dose but given her syncope in the Spring we will cautiously start  HLD with DM Mild CAS on aspirin Aortic atherosclerosis -LDL goal less than 70 - will attempt to get lipids from Dr. Inda Merlin  Systolic murmur: has had mild to moderate TR and may repeat echo at next visit  Six month follow up  Would be reasonable for  APP Follow up     Medication Adjustments/Labs and Tests Ordered: Current medicines are reviewed at length with the patient today.  Concerns regarding medicines are outlined above.  No orders of the defined types were placed in this encounter.  Meds ordered this  encounter  Medications   hydrALAZINE (APRESOLINE) 10 MG tablet    Sig: As need for SBP > 160    Dispense:  90 tablet    Refill:  3     Patient Instructions  Medication Instructions:  Your physician has recommended you make the following change in your medication: START: hydralazine 10 mg By mouth daily as needed for SBP > 160  *If you need a refill on your cardiac medications before your next appointment, please call your pharmacy*   Lab Work: NONE If you have labs (blood work) drawn today and your tests are completely normal, you will receive your results only by: [place] (if you have MyChart) OR A paper copy in the mail If you have any lab test that is abnormal or we need to change your treatment, we will call you to review the results.   Testing/Procedures: NONE   Follow-Up: At [facility], you and your health needs are our priority.  As part of our continuing mission to provide you with exceptional heart care, we have created designated Provider Care Teams.  These Care Teams include your primary Cardiologist (physician) and Advanced Practice Providers (APPs -  Physician Assistants and Nurse Practitioners) who all work together to provide you with the care you need, when you need it.  We recommend signing up for the patient  portal called "MyChart".  Sign up information is provided on this After Visit Summary.  MyChart is used to connect with patients for Virtual Visits (Telemedicine).  Patients are able to view lab/test results, encounter notes, upcoming appointments, etc.  Non-urgent messages can be sent to your provider as well.   To learn more about what you can do with MyChart, go to [email or web address].    Your next appointment:   6 month(s)  The format for your next appointment:   In Person  Provider:   You may see Werner Lean, MD or one of the following Advanced Practice Providers on your designated Care Team:   Melina Copa, PA-C Ermalinda Barrios, PA-C   Other Instructions Please check ambulatory BP readings.     Signed, Werner Lean, MD  09/21/2021 9:27 AM    [facility]

## 2021-09-21 ENCOUNTER — Ambulatory Visit (INDEPENDENT_AMBULATORY_CARE_PROVIDER_SITE_OTHER): Payer: Medicare Other | Admitting: Internal Medicine

## 2021-09-21 ENCOUNTER — Other Ambulatory Visit: Payer: Self-pay

## 2021-09-21 ENCOUNTER — Encounter: Payer: Self-pay | Admitting: Internal Medicine

## 2021-09-21 VITALS — BP 180/68 | HR 68 | Ht <= 58 in | Wt 130.0 lb

## 2021-09-21 DIAGNOSIS — I359 Nonrheumatic aortic valve disorder, unspecified: Secondary | ICD-10-CM

## 2021-09-21 DIAGNOSIS — I951 Orthostatic hypotension: Secondary | ICD-10-CM

## 2021-09-21 DIAGNOSIS — E1169 Type 2 diabetes mellitus with other specified complication: Secondary | ICD-10-CM

## 2021-09-21 DIAGNOSIS — E1159 Type 2 diabetes mellitus with other circulatory complications: Secondary | ICD-10-CM

## 2021-09-21 DIAGNOSIS — M6281 Muscle weakness (generalized): Secondary | ICD-10-CM | POA: Diagnosis not present

## 2021-09-21 DIAGNOSIS — I152 Hypertension secondary to endocrine disorders: Secondary | ICD-10-CM

## 2021-09-21 DIAGNOSIS — E785 Hyperlipidemia, unspecified: Secondary | ICD-10-CM | POA: Diagnosis not present

## 2021-09-21 DIAGNOSIS — I6523 Occlusion and stenosis of bilateral carotid arteries: Secondary | ICD-10-CM | POA: Diagnosis not present

## 2021-09-21 DIAGNOSIS — I700 Atherosclerosis of aorta: Secondary | ICD-10-CM | POA: Diagnosis not present

## 2021-09-21 DIAGNOSIS — Z96641 Presence of right artificial hip joint: Secondary | ICD-10-CM | POA: Diagnosis not present

## 2021-09-21 DIAGNOSIS — M25651 Stiffness of right hip, not elsewhere classified: Secondary | ICD-10-CM | POA: Diagnosis not present

## 2021-09-21 MED ORDER — HYDRALAZINE HCL 10 MG PO TABS: ORAL_TABLET | ORAL | 3 refills | Status: DC

## 2021-09-21 NOTE — Patient Instructions (Signed)
Medication Instructions:  Your physician has recommended you make the following change in your medication: START: hydralazine 10 mg By mouth daily as needed for SBP > 160  *If you need a refill on your cardiac medications before your next appointment, please call your pharmacy*   Lab Work: NONE If you have labs (blood work) drawn today and your tests are completely normal, you will receive your results only by: [place] (if you have MyChart) OR A paper copy in the mail If you have any lab test that is abnormal or we need to change your treatment, we will call you to review the results.   Testing/Procedures: NONE   Follow-Up: At [facility], you and your health needs are our priority.  As part of our continuing mission to provide you with exceptional heart care, we have created designated Provider Care Teams.  These Care Teams include your primary Cardiologist (physician) and Advanced Practice Providers (APPs -  Physician Assistants and Nurse Practitioners) who all work together to provide you with the care you need, when you need it.  We recommend signing up for the patient portal called "MyChart".  Sign up information is provided on this After Visit Summary.  MyChart is used to connect with patients for Virtual Visits (Telemedicine).  Patients are able to view lab/test results, encounter notes, upcoming appointments, etc.  Non-urgent messages can be sent to your provider as well.   To learn more about what you can do with MyChart, go to [email or web address].    Your next appointment:   6 month(s)  The format for your next appointment:   In Person  Provider:   You may see Werner Lean, MD or one of the following Advanced Practice Providers on your designated Care Team:   Melina Copa, PA-C Ermalinda Barrios, PA-C   Other Instructions Please check ambulatory BP readings.

## 2021-09-22 DIAGNOSIS — M25651 Stiffness of right hip, not elsewhere classified: Secondary | ICD-10-CM | POA: Diagnosis not present

## 2021-09-22 DIAGNOSIS — M6281 Muscle weakness (generalized): Secondary | ICD-10-CM | POA: Diagnosis not present

## 2021-09-22 DIAGNOSIS — Z96641 Presence of right artificial hip joint: Secondary | ICD-10-CM | POA: Diagnosis not present

## 2021-09-25 DIAGNOSIS — Z96641 Presence of right artificial hip joint: Secondary | ICD-10-CM | POA: Diagnosis not present

## 2021-09-25 DIAGNOSIS — M25651 Stiffness of right hip, not elsewhere classified: Secondary | ICD-10-CM | POA: Diagnosis not present

## 2021-09-25 DIAGNOSIS — M6281 Muscle weakness (generalized): Secondary | ICD-10-CM | POA: Diagnosis not present

## 2021-09-28 DIAGNOSIS — H04123 Dry eye syndrome of bilateral lacrimal glands: Secondary | ICD-10-CM | POA: Diagnosis not present

## 2021-09-28 DIAGNOSIS — H3563 Retinal hemorrhage, bilateral: Secondary | ICD-10-CM | POA: Diagnosis not present

## 2021-09-28 DIAGNOSIS — H02201 Unspecified lagophthalmos right upper eyelid: Secondary | ICD-10-CM | POA: Diagnosis not present

## 2021-09-28 DIAGNOSIS — H02831 Dermatochalasis of right upper eyelid: Secondary | ICD-10-CM | POA: Diagnosis not present

## 2021-09-29 DIAGNOSIS — M25651 Stiffness of right hip, not elsewhere classified: Secondary | ICD-10-CM | POA: Diagnosis not present

## 2021-09-29 DIAGNOSIS — M6281 Muscle weakness (generalized): Secondary | ICD-10-CM | POA: Diagnosis not present

## 2021-09-29 DIAGNOSIS — Z96641 Presence of right artificial hip joint: Secondary | ICD-10-CM | POA: Diagnosis not present

## 2021-10-01 DIAGNOSIS — I10 Essential (primary) hypertension: Secondary | ICD-10-CM | POA: Diagnosis not present

## 2021-10-01 DIAGNOSIS — R42 Dizziness and giddiness: Secondary | ICD-10-CM | POA: Diagnosis not present

## 2021-10-07 ENCOUNTER — Other Ambulatory Visit: Payer: Self-pay | Admitting: Ophthalmology

## 2021-10-07 DIAGNOSIS — R42 Dizziness and giddiness: Secondary | ICD-10-CM

## 2021-10-07 DIAGNOSIS — M87050 Idiopathic aseptic necrosis of pelvis: Secondary | ICD-10-CM | POA: Diagnosis not present

## 2021-10-07 DIAGNOSIS — H539 Unspecified visual disturbance: Secondary | ICD-10-CM

## 2021-10-07 DIAGNOSIS — M25551 Pain in right hip: Secondary | ICD-10-CM | POA: Diagnosis not present

## 2021-10-07 DIAGNOSIS — M7061 Trochanteric bursitis, right hip: Secondary | ICD-10-CM | POA: Diagnosis not present

## 2021-10-07 DIAGNOSIS — Z96641 Presence of right artificial hip joint: Secondary | ICD-10-CM | POA: Diagnosis not present

## 2021-10-23 ENCOUNTER — Other Ambulatory Visit: Payer: Self-pay

## 2021-10-23 ENCOUNTER — Ambulatory Visit (INDEPENDENT_AMBULATORY_CARE_PROVIDER_SITE_OTHER): Payer: Medicare Other | Admitting: Podiatry

## 2021-10-23 ENCOUNTER — Encounter: Payer: Self-pay | Admitting: Podiatry

## 2021-10-23 DIAGNOSIS — L84 Corns and callosities: Secondary | ICD-10-CM

## 2021-10-23 NOTE — Progress Notes (Signed)
Subjective:   Patient ID: Christina Sellers, female   DOB: 80 y.o.   MRN: [id number]   HPI Patient presents lesions plantar left   ROS      Objective:  Physical Exam  Neurovascular status intact to loosen way lesions plantar aspect left painful     Assessment:  Porokeratotic lesions left     Plan:  Debrided lesions no iatrogenic bleeding reappoint routine care

## 2021-10-25 IMAGING — CT CT ANGIO CHEST
2 of 6 series · 18 of 46 positions shown · IV contrast (omnipaque)
Comparison: None.

CLINICAL DATA: Syncope.

EXAM:
CT ANGIOGRAPHY CHEST WITH CONTRAST
TECHNIQUE: Multidetector CT imaging of the chest was performed using the
standard protocol during bolus administration of intravenous
contrast. Multiplanar CT image reconstructions and MIPs were
obtained to evaluate the vascular anatomy.
CONTRAST:  60mL OMNIPAQUE IOHEXOL 350 MG/ML SOLN

[Series 6: thins · axial · 0.65mm/px · z∈[+1104,+1328]mm · 15 of 246 slices shown]
[im 11/246  lung]
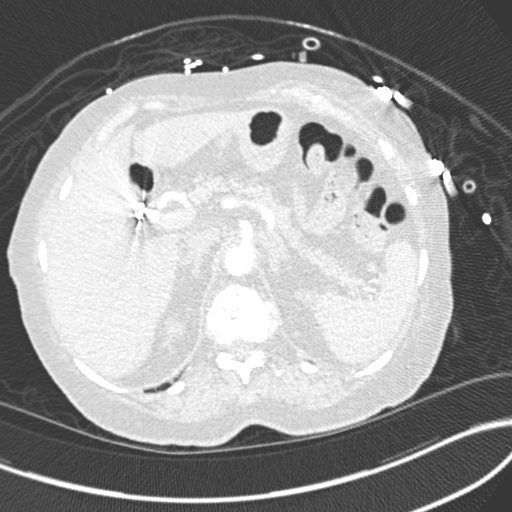
[im 32/246  soft-tissue]
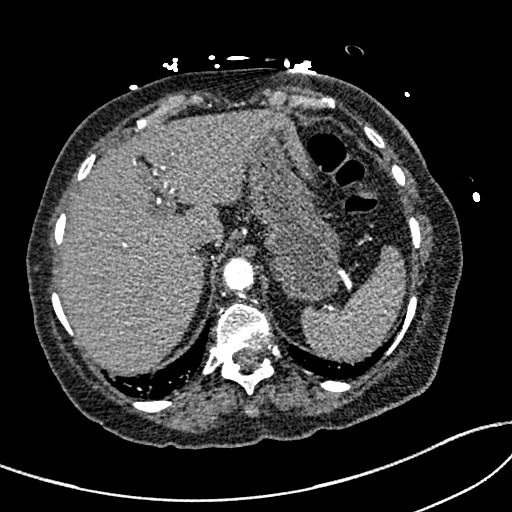
[im 43/246  lung]
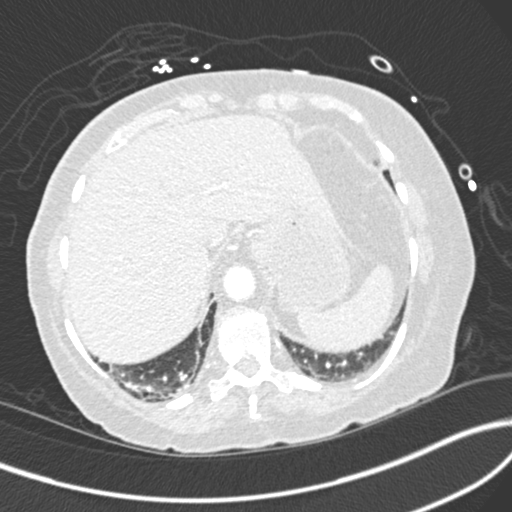
[im 64/246  soft-tissue]
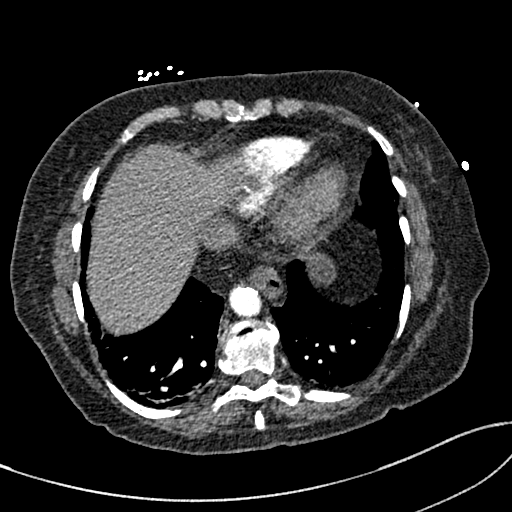
[im 75/246  lung]
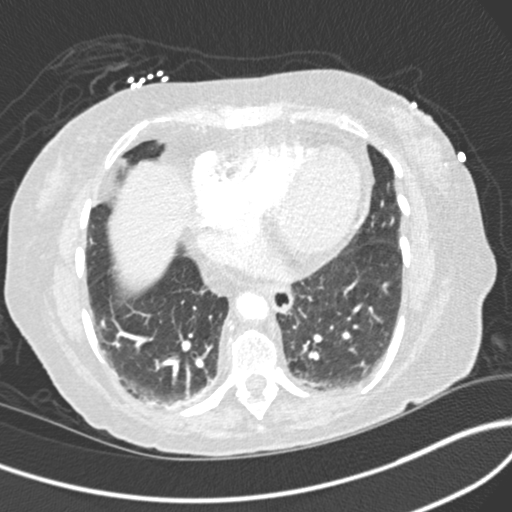
[im 96/246  soft-tissue]
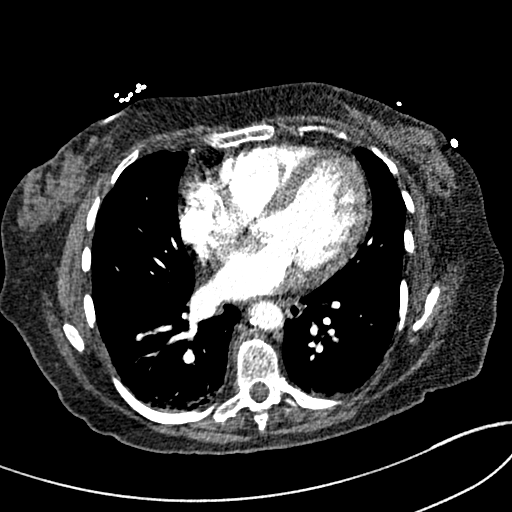
[im 107/246  lung]
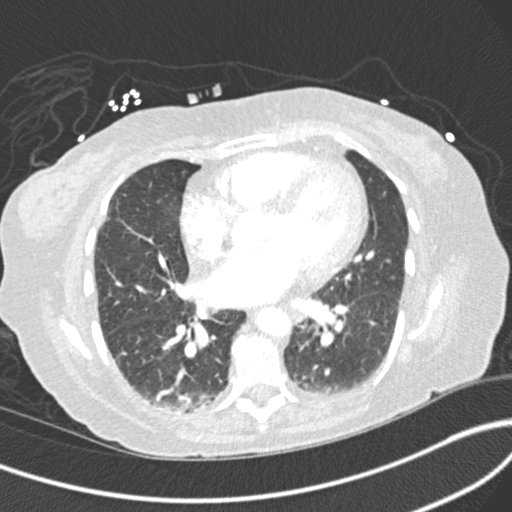
[im 128/246  soft-tissue]
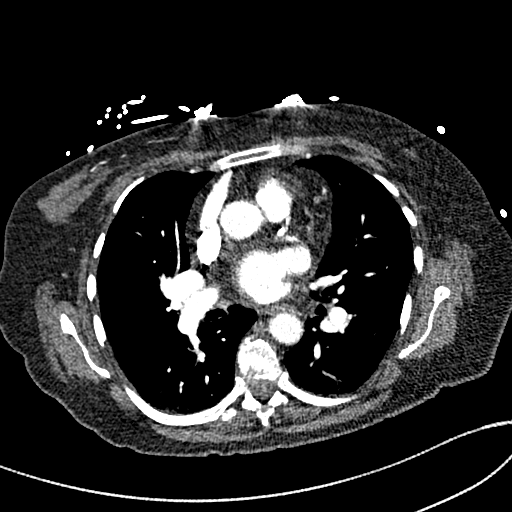
[im 139/246  lung]
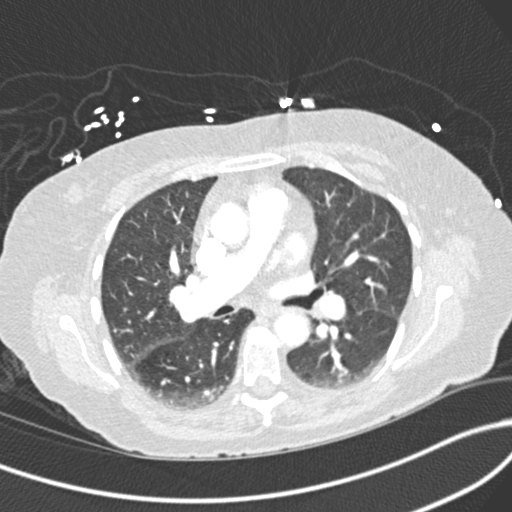
[im 150/246  soft-tissue]
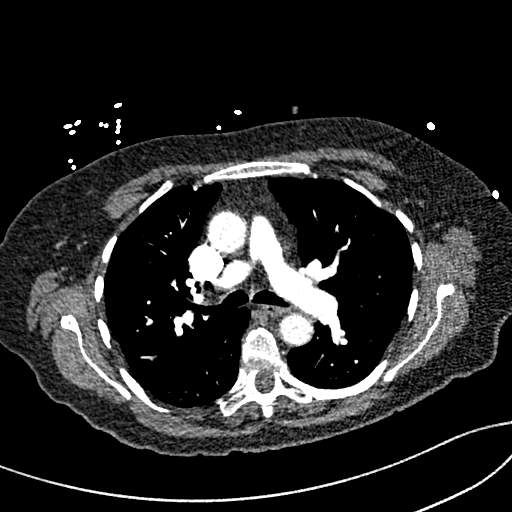
[im 171/246  lung]
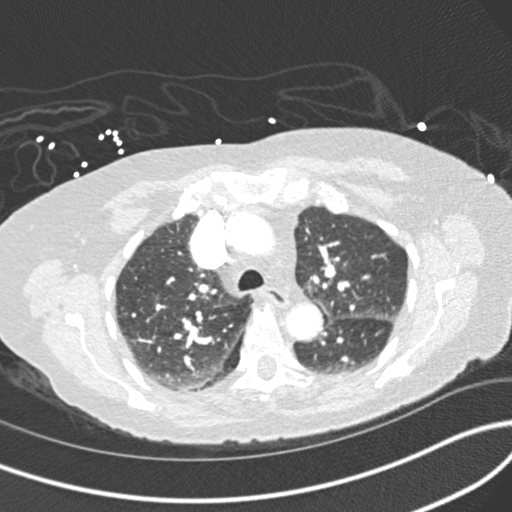
[im 182/246  soft-tissue]
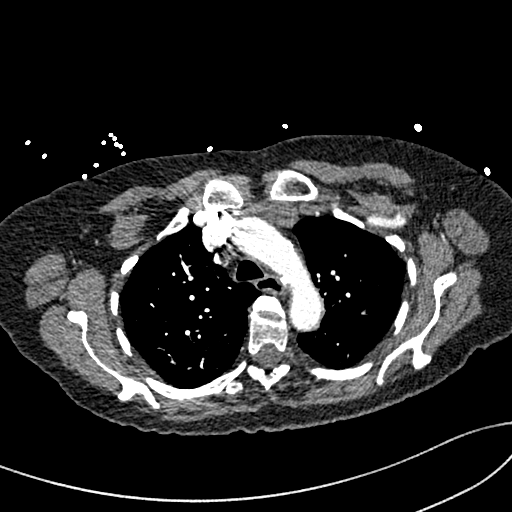
[im 203/246  lung]
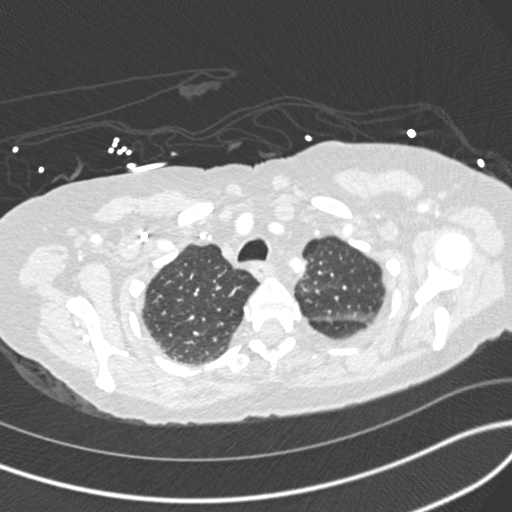
[im 214/246  soft-tissue]
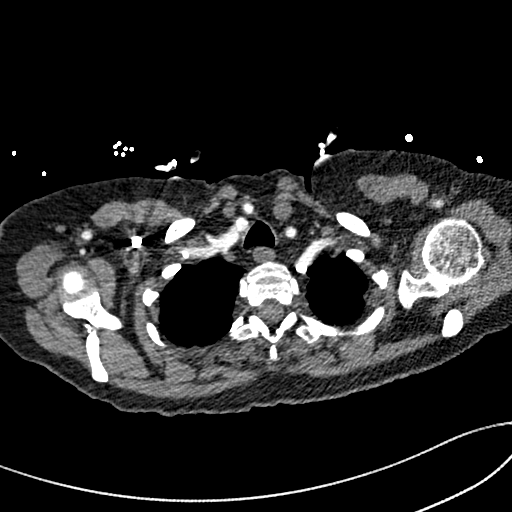
[im 235/246  lung]
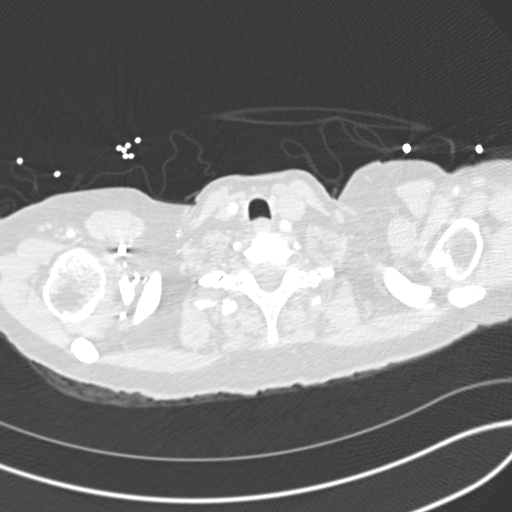

[Series 8: coronal mpr · coronal · 0.48mm/px · 3 of 133 slices shown]
[im 34/133  soft-tissue]
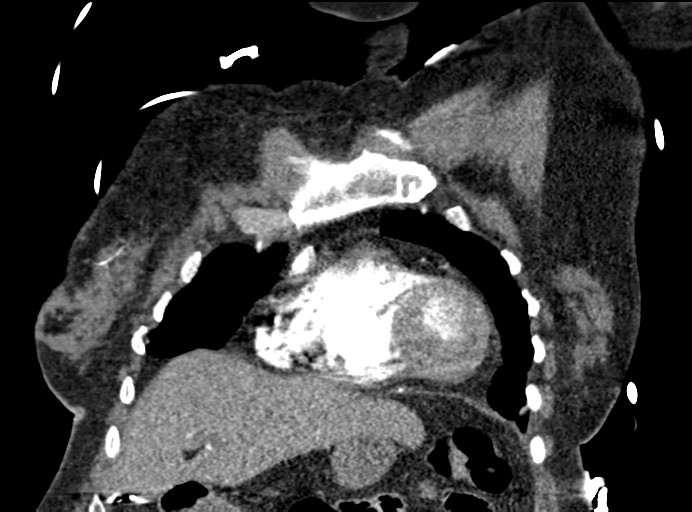
[im 67/133  soft-tissue]
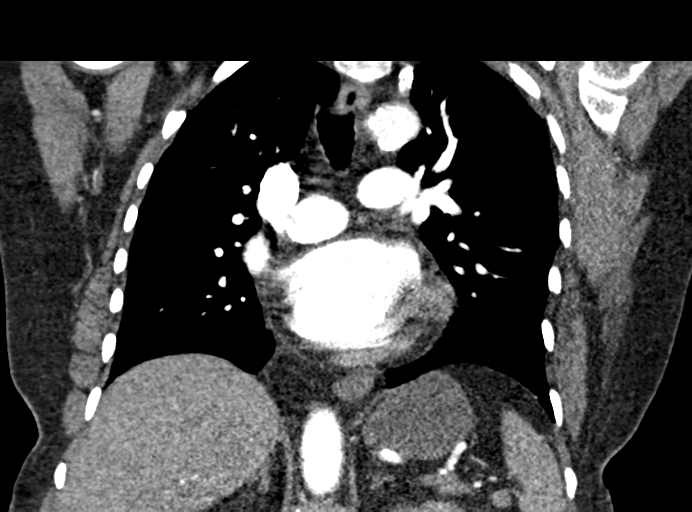
[im 100/133  soft-tissue]
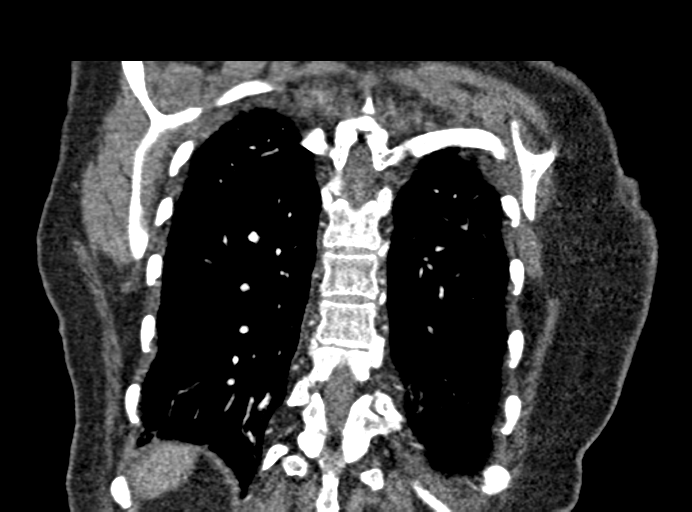

[18 of 46 positions shown; findings below may reference images not displayed]

FINDINGS: Cardiovascular: Moderate severity calcification of the aortic arch
is seen. There is no evidence of aneurysmal dilatation or
dissection. Satisfactory opacification of the pulmonary arteries to
the segmental level. No evidence of pulmonary embolism. Normal heart
size with mild to moderate severity coronary artery calcification.
No pericardial effusion.

Mediastinum/Nodes: No enlarged mediastinal, hilar, or axillary lymph
nodes. Thyroid gland, trachea, and esophagus demonstrate no
significant findings.

Lungs/Pleura: Very mild atelectasis is seen within the posterior
aspects of the bilateral lower lobes.

There is no evidence of acute infiltrate, pleural effusion or
pneumothorax.

Upper Abdomen: Surgical clips are seen within the gallbladder fossa.

Musculoskeletal: Multilevel degenerative changes seen throughout the
thoracic spine.

Review of the MIP images confirms the above findings.
IMPRESSION: 1.   No evidence of pulmonary embolism.
2.   Mild bilateral lower lobe atelectasis.
3.   Evidence of prior cholecystectomy.
4. Aortic atherosclerosis.

Aortic Atherosclerosis ([id number]-[id number].A).

## 2021-10-26 ENCOUNTER — Other Ambulatory Visit: Payer: Self-pay

## 2021-10-26 ENCOUNTER — Ambulatory Visit
Admission: RE | Admit: 2021-10-26 | Discharge: 2021-10-26 | Disposition: A | Discharge status: Home / Self Care | Payer: Medicare Other | Source: Ambulatory Visit | Attending: Ophthalmology | Admitting: Ophthalmology

## 2021-10-26 DIAGNOSIS — I6523 Occlusion and stenosis of bilateral carotid arteries: Secondary | ICD-10-CM | POA: Diagnosis not present

## 2021-10-26 DIAGNOSIS — I672 Cerebral atherosclerosis: Secondary | ICD-10-CM | POA: Diagnosis not present

## 2021-10-26 DIAGNOSIS — R42 Dizziness and giddiness: Secondary | ICD-10-CM

## 2021-10-26 DIAGNOSIS — H539 Unspecified visual disturbance: Secondary | ICD-10-CM | POA: Diagnosis not present

## 2021-10-26 MED ORDER — IOPAMIDOL (ISOVUE-370) INJECTION 76%
75.0000 mL | Freq: Once | INTRAVENOUS | Status: AC | PRN
  Administered 2021-10-26: 75 mL via INTRAVENOUS

## 2021-10-27 DIAGNOSIS — E039 Hypothyroidism, unspecified: Secondary | ICD-10-CM | POA: Diagnosis not present

## 2021-10-27 DIAGNOSIS — I10 Essential (primary) hypertension: Secondary | ICD-10-CM | POA: Diagnosis not present

## 2021-10-27 DIAGNOSIS — N183 Chronic kidney disease, stage 3 unspecified: Secondary | ICD-10-CM | POA: Diagnosis not present

## 2021-10-27 DIAGNOSIS — R739 Hyperglycemia, unspecified: Secondary | ICD-10-CM | POA: Diagnosis not present

## 2021-10-27 DIAGNOSIS — N1831 Chronic kidney disease, stage 3a: Secondary | ICD-10-CM | POA: Diagnosis not present

## 2021-11-09 DIAGNOSIS — I10 Essential (primary) hypertension: Secondary | ICD-10-CM | POA: Diagnosis not present

## 2021-11-09 DIAGNOSIS — R54 Age-related physical debility: Secondary | ICD-10-CM | POA: Diagnosis not present

## 2021-11-09 DIAGNOSIS — N183 Chronic kidney disease, stage 3 unspecified: Secondary | ICD-10-CM | POA: Diagnosis not present

## 2021-11-09 DIAGNOSIS — R739 Hyperglycemia, unspecified: Secondary | ICD-10-CM | POA: Diagnosis not present

## 2021-11-13 DIAGNOSIS — M25651 Stiffness of right hip, not elsewhere classified: Secondary | ICD-10-CM | POA: Diagnosis not present

## 2021-11-13 DIAGNOSIS — Z96641 Presence of right artificial hip joint: Secondary | ICD-10-CM | POA: Diagnosis not present

## 2021-11-13 DIAGNOSIS — M6281 Muscle weakness (generalized): Secondary | ICD-10-CM | POA: Diagnosis not present

## 2021-11-19 ENCOUNTER — Ambulatory Visit: Payer: Medicare Other | Admitting: Internal Medicine

## 2021-11-19 DIAGNOSIS — M25651 Stiffness of right hip, not elsewhere classified: Secondary | ICD-10-CM | POA: Diagnosis not present

## 2021-11-19 DIAGNOSIS — Z96641 Presence of right artificial hip joint: Secondary | ICD-10-CM | POA: Diagnosis not present

## 2021-11-19 DIAGNOSIS — M6281 Muscle weakness (generalized): Secondary | ICD-10-CM | POA: Diagnosis not present

## 2021-11-23 DIAGNOSIS — Z96641 Presence of right artificial hip joint: Secondary | ICD-10-CM | POA: Diagnosis not present

## 2021-11-23 DIAGNOSIS — M25651 Stiffness of right hip, not elsewhere classified: Secondary | ICD-10-CM | POA: Diagnosis not present

## 2021-11-23 DIAGNOSIS — M6281 Muscle weakness (generalized): Secondary | ICD-10-CM | POA: Diagnosis not present

## 2021-11-27 DIAGNOSIS — M25651 Stiffness of right hip, not elsewhere classified: Secondary | ICD-10-CM | POA: Diagnosis not present

## 2021-11-27 DIAGNOSIS — M6281 Muscle weakness (generalized): Secondary | ICD-10-CM | POA: Diagnosis not present

## 2021-11-27 DIAGNOSIS — Z96641 Presence of right artificial hip joint: Secondary | ICD-10-CM | POA: Diagnosis not present

## 2021-12-01 DIAGNOSIS — Z96641 Presence of right artificial hip joint: Secondary | ICD-10-CM | POA: Diagnosis not present

## 2021-12-01 DIAGNOSIS — M6281 Muscle weakness (generalized): Secondary | ICD-10-CM | POA: Diagnosis not present

## 2021-12-01 DIAGNOSIS — M25651 Stiffness of right hip, not elsewhere classified: Secondary | ICD-10-CM | POA: Diagnosis not present

## 2021-12-04 DIAGNOSIS — M25651 Stiffness of right hip, not elsewhere classified: Secondary | ICD-10-CM | POA: Diagnosis not present

## 2021-12-04 DIAGNOSIS — Z96641 Presence of right artificial hip joint: Secondary | ICD-10-CM | POA: Diagnosis not present

## 2021-12-04 DIAGNOSIS — M6281 Muscle weakness (generalized): Secondary | ICD-10-CM | POA: Diagnosis not present

## 2021-12-15 DIAGNOSIS — U071 COVID-19: Secondary | ICD-10-CM | POA: Diagnosis not present

## 2021-12-31 DIAGNOSIS — Z96641 Presence of right artificial hip joint: Secondary | ICD-10-CM | POA: Diagnosis not present

## 2021-12-31 DIAGNOSIS — M25651 Stiffness of right hip, not elsewhere classified: Secondary | ICD-10-CM | POA: Diagnosis not present

## 2021-12-31 DIAGNOSIS — M6281 Muscle weakness (generalized): Secondary | ICD-10-CM | POA: Diagnosis not present

## 2022-01-05 DIAGNOSIS — R413 Other amnesia: Secondary | ICD-10-CM | POA: Diagnosis not present

## 2022-01-05 DIAGNOSIS — G4733 Obstructive sleep apnea (adult) (pediatric): Secondary | ICD-10-CM | POA: Diagnosis not present

## 2022-01-05 DIAGNOSIS — Z0000 Encounter for general adult medical examination without abnormal findings: Secondary | ICD-10-CM | POA: Diagnosis not present

## 2022-01-05 DIAGNOSIS — R739 Hyperglycemia, unspecified: Secondary | ICD-10-CM | POA: Diagnosis not present

## 2022-01-05 DIAGNOSIS — I10 Essential (primary) hypertension: Secondary | ICD-10-CM | POA: Diagnosis not present

## 2022-01-05 DIAGNOSIS — E538 Deficiency of other specified B group vitamins: Secondary | ICD-10-CM | POA: Diagnosis not present

## 2022-01-05 DIAGNOSIS — I700 Atherosclerosis of aorta: Secondary | ICD-10-CM | POA: Diagnosis not present

## 2022-01-05 DIAGNOSIS — N183 Chronic kidney disease, stage 3 unspecified: Secondary | ICD-10-CM | POA: Diagnosis not present

## 2022-01-05 DIAGNOSIS — R54 Age-related physical debility: Secondary | ICD-10-CM | POA: Diagnosis not present

## 2022-01-05 DIAGNOSIS — E1136 Type 2 diabetes mellitus with diabetic cataract: Secondary | ICD-10-CM | POA: Diagnosis not present

## 2022-01-05 DIAGNOSIS — E559 Vitamin D deficiency, unspecified: Secondary | ICD-10-CM | POA: Diagnosis not present

## 2022-01-05 DIAGNOSIS — E039 Hypothyroidism, unspecified: Secondary | ICD-10-CM | POA: Diagnosis not present

## 2022-01-05 DIAGNOSIS — E7800 Pure hypercholesterolemia, unspecified: Secondary | ICD-10-CM | POA: Diagnosis not present

## 2022-01-05 DIAGNOSIS — J449 Chronic obstructive pulmonary disease, unspecified: Secondary | ICD-10-CM | POA: Diagnosis not present

## 2022-01-21 ENCOUNTER — Telehealth: Payer: Self-pay | Admitting: Internal Medicine

## 2022-01-21 DIAGNOSIS — M810 Age-related osteoporosis without current pathological fracture: Secondary | ICD-10-CM | POA: Diagnosis not present

## 2022-01-21 NOTE — Telephone Encounter (Signed)
Pt tells me that Dr. Inda Merlin is recommending she restart Meloxicam for pain. States she was on this years ago. MD wants her to take this every other day. States drug information says it could cause issues with her heart, so she is calling to make sure this is safe for her to start/take. Aware will forward to Dr. Gasper Sells and his nurse to address. Aware his nurse will call with recommendation/advisement. Pt is agreeable to plan.

## 2022-01-21 NOTE — Telephone Encounter (Signed)
Pt c/o medication issue:  1. Name of Medication: meloxicam  2. How are you currently taking this medication (dosage and times per day)? Has not started  3. Are you having a reaction (difficulty breathing--STAT)? no  4. What is your medication issue? Patient states her PCP Dr. Inda Merlin prescribed meloxicam, but in the paperwork it came with it states the medication could effect the heart. She would like to know if it is okay for her to take. She states she also has an appointment today and says it is okay to leave detailed voicemail.

## 2022-01-22 NOTE — Telephone Encounter (Signed)
Patient returned call

## 2022-01-22 NOTE — Telephone Encounter (Signed)
Called pt reviewed MD recommendations. Pt had further questions regarding meloxicam advised pt to reach out to prescribing provider for additional information as this is not a medication that our office prescribes.

## 2022-01-22 NOTE — Telephone Encounter (Signed)
Called pt unable to leave a voicemail d/t box full.

## 2022-01-26 ENCOUNTER — Other Ambulatory Visit: Payer: Self-pay

## 2022-01-26 ENCOUNTER — Ambulatory Visit: Payer: Medicare Other | Admitting: Nurse Practitioner

## 2022-01-26 ENCOUNTER — Telehealth: Payer: Self-pay | Admitting: *Deleted

## 2022-01-26 ENCOUNTER — Encounter: Payer: Self-pay | Admitting: Nurse Practitioner

## 2022-01-26 VITALS — BP 124/84

## 2022-01-26 DIAGNOSIS — N813 Complete uterovaginal prolapse: Secondary | ICD-10-CM | POA: Diagnosis not present

## 2022-01-26 DIAGNOSIS — Q525 Fusion of labia: Secondary | ICD-10-CM | POA: Diagnosis not present

## 2022-01-26 DIAGNOSIS — L900 Lichen sclerosus et atrophicus: Secondary | ICD-10-CM

## 2022-01-26 MED ORDER — CLOBETASOL PROPIONATE 0.05 % EX OINT: TOPICAL_OINTMENT | CUTANEOUS | 2 refills | Status: DC

## 2022-01-26 NOTE — Progress Notes (Signed)
° °  Acute Office Visit  Subjective:    Patient ID: Christina Sellers, female    DOB: [birth year], 81 y.o.   MRN: [id number]   HPI 81 y.o. presents today for "knot" in vaginal area. She noticed it months ago and feels it has stayed the same. She notices it most when having a bowel movement. She does experience urinary frequency and difficulty emptying bladder. She has to double void. Sister with history of uterine prolapse. She has history of lichen sclerosus and using Clobetasol as needed. She needs refills.    Review of Systems  Constitutional: Negative.   Genitourinary:  Positive for difficulty urinating (Has to double void) and frequency. Negative for dysuria, urgency, vaginal bleeding, vaginal discharge and vaginal pain.       Vaginal bulge      Objective:    Physical Exam Constitutional:      Appearance: Normal appearance.  Genitourinary:    Cervix: Normal.     Uterus: With uterine prolapse (Grade 2-3 uterine prolapse with cystocele).      Comments: Atrophy present. Fusion of anterior labia. Thin, white appearance consistent with LS     BP 124/84  Wt Readings from Last 3 Encounters:  09/21/21 130 lb (59 kg)  03/30/21 129 lb 10.1 oz (58.8 kg)  03/23/21 129 lb 9.6 oz (58.8 kg)        Patient informed chaperone available to be present for breast and pelvic exam. Patient has requested no chaperone to be present. Patient has been advised what will be completed during breast and pelvic exam.   Assessment & Plan:   Problem List Items Addressed This Visit       Genitourinary   Labial fusion   Other Visit Diagnoses     Cystocele with third degree uterine prolapse    -  Primary   Lichen sclerosus       Relevant Medications   clobetasol ointment (TEMOVATE) 0.05 %      Plan: Uterine prolapse and cystocele present. Recommend urogynecology referral for management. We discussed management with lifestyle changes, pelvic floor PT, pessary, and surgery. She is likely not a  candidate for surgery and she is not interested in this. Pessary may also be difficult due to labial fusion. Refill provided on Clobetasol ointment and recommend using consistently 2-3 times per week. She is agreeable to plan.      Tamela Gammon DNP, 3:20 PM 01/26/2022

## 2022-01-26 NOTE — Telephone Encounter (Signed)
-----   Message from Tamela Gammon, NP sent at 01/26/2022  3:21 PM EST ----- Regarding: Urogyn referral Please send referral to urogynecology for uterine prolapse with cystocele. Thank you.

## 2022-01-26 NOTE — Telephone Encounter (Signed)
Referral placed at [facility] urogynecology they will call patient to schedule.

## 2022-01-27 ENCOUNTER — Encounter: Payer: Self-pay | Admitting: Nurse Practitioner

## 2022-01-27 DIAGNOSIS — L239 Allergic contact dermatitis, unspecified cause: Secondary | ICD-10-CM | POA: Diagnosis not present

## 2022-01-29 ENCOUNTER — Other Ambulatory Visit: Payer: Self-pay

## 2022-01-29 ENCOUNTER — Emergency Department (HOSPITAL_BASED_OUTPATIENT_CLINIC_OR_DEPARTMENT_OTHER): Payer: Medicare Other

## 2022-01-29 ENCOUNTER — Emergency Department (HOSPITAL_BASED_OUTPATIENT_CLINIC_OR_DEPARTMENT_OTHER)
Admission: EM | Admit: 2022-01-29 | Discharge: 2022-01-29 | Disposition: A | Discharge status: Home / Self Care | Payer: Medicare Other | Attending: Emergency Medicine | Admitting: Emergency Medicine

## 2022-01-29 ENCOUNTER — Encounter (HOSPITAL_BASED_OUTPATIENT_CLINIC_OR_DEPARTMENT_OTHER): Payer: Self-pay | Admitting: *Deleted

## 2022-01-29 DIAGNOSIS — S0990XA Unspecified injury of head, initial encounter: Secondary | ICD-10-CM

## 2022-01-29 DIAGNOSIS — I10 Essential (primary) hypertension: Secondary | ICD-10-CM | POA: Diagnosis not present

## 2022-01-29 DIAGNOSIS — W19XXXA Unspecified fall, initial encounter: Secondary | ICD-10-CM

## 2022-01-29 DIAGNOSIS — Z043 Encounter for examination and observation following other accident: Secondary | ICD-10-CM | POA: Diagnosis not present

## 2022-01-29 DIAGNOSIS — S0101XA Laceration without foreign body of scalp, initial encounter: Secondary | ICD-10-CM | POA: Diagnosis not present

## 2022-01-29 DIAGNOSIS — Y92009 Unspecified place in unspecified non-institutional (private) residence as the place of occurrence of the external cause: Secondary | ICD-10-CM | POA: Insufficient documentation

## 2022-01-29 DIAGNOSIS — Z79899 Other long term (current) drug therapy: Secondary | ICD-10-CM | POA: Diagnosis not present

## 2022-01-29 DIAGNOSIS — G319 Degenerative disease of nervous system, unspecified: Secondary | ICD-10-CM | POA: Diagnosis not present

## 2022-01-29 DIAGNOSIS — W01198A Fall on same level from slipping, tripping and stumbling with subsequent striking against other object, initial encounter: Secondary | ICD-10-CM | POA: Diagnosis not present

## 2022-01-29 NOTE — ED Notes (Signed)
MD aware of Hypertension during discharge.

## 2022-01-29 NOTE — ED Triage Notes (Signed)
Pt is here due to trip and fell at home and she hit her head on her left side on her ottoman. Pt has a small lac and bleeding is controlled.  No LOC

## 2022-01-29 NOTE — ED Provider Notes (Signed)
[facility] Provider Note   CSN: [id number] Arrival date & time: 01/29/22  1849     History  Chief Complaint  Patient presents with   Lytle Michaels    Christina Sellers is a 81 y.o. female.  She is here with a complaint of a mechanical fall at home about 2 hours ago.  She said she was changing her pants and got her legs tangled up and fell over hitting her head on a ottoman.  No loss of consciousness.  Does have a small laceration over her left temple with no active bleeding.  No neck pain no chest or abdominal pain no extremity pain has been ambulatory without any difficulty.  Not on blood thinners.  Last tetanus shot was 1 year ago.  The history is provided by the patient and a relative.  Fall This is a new problem. The current episode started 1 to 2 hours ago. The problem has been resolved. Associated symptoms include headaches. Pertinent negatives include no chest pain, no abdominal pain and no shortness of breath. Nothing aggravates the symptoms. Nothing relieves the symptoms. She has tried nothing for the symptoms. The treatment provided no relief.      Home Medications Prior to Admission medications   Medication Sig Start Date End Date Taking? Authorizing Provider  amLODipine (NORVASC) 5 MG tablet Take 5 mg by mouth daily.    [provider]  ascorbic acid (VITAMIN C) 500 MG tablet Take 500 mg by mouth daily.    [provider]  blood glucose meter kit and supplies KIT Dispense based on patient and insurance preference. Use up to four times daily as directed. Patient not taking: Reported on 01/26/2022 02/16/21   Niel Hummer A, MD  Cholecalciferol (VITAMIN D3) 50 MCG (2000 UT) capsule Take 2,000 mg by mouth daily.    [provider]  clobetasol ointment (TEMOVATE) 0.05 % Apply externally daily x 2 weeks, then decrease to every other day x 2 weeks, then twice weekly 01/26/22   Marny Lowenstein A, NP  denosumab (PROLIA) 60 MG/ML  SOSY injection Inject 60 mg into the skin every 6 (six) months.    [provider]  diclofenac sodium (VOLTAREN) 1 % GEL Apply 1 application topically 2 (two) times daily as needed (to painful sites). 03/29/19   [provider]  feeding supplement (ENSURE ENLIVE / ENSURE PLUS) LIQD Take 237 mLs by mouth 2 (two) times daily between meals. 02/17/21   Regalado, Belkys A, MD  finasteride (PROSCAR) 5 MG tablet Take 2.5 mg by mouth daily. 02/19/21   [provider]  fluticasone (FLONASE) 50 MCG/ACT nasal spray Place 1 spray into both nostrils daily as needed (for seasonal allergies).    [provider]  hydrALAZINE (APRESOLINE) 10 MG tablet As need for SBP > 160 09/21/21   Chandrasekhar, Mahesh A, MD  hypromellose (SYSTANE OVERNIGHT THERAPY) 0.3 % GEL ophthalmic ointment Place 1 application into both eyes at bedtime as needed for dry eyes.    [provider]  levothyroxine (SYNTHROID) 75 MCG tablet Take 75 mcg by mouth every morning. 07/15/21   [provider]  meloxicam (MOBIC) 15 MG tablet Take 15 mg by mouth every other day. 01/20/22   [provider]  Menthol, Topical Analgesic, (BIOFREEZE EX) Apply 1 application topically 2 (two) times daily as needed (for knee pain).    [provider]  methocarbamol (ROBAXIN) 500 MG tablet Take 250 mg by mouth 3 (three) times daily as needed for  muscle spasms (or back pain).    [provider]  Methylcobalamin 1000 MCG TBDP Take 1,000 mcg by mouth daily.    [provider]  Multiple Vitamins-Minerals (MULTIVITAMIN WITH MINERALS) tablet Take 1 tablet by mouth daily.    [provider]  nystatin cream (MYCOSTATIN) Apply 1 application topically 2 (two) times daily. 04/28/21   Tamela Gammon, NP  OVER THE COUNTER MEDICATION Take 2 capsules by mouth daily. [facility]    [provider]  potassium chloride SA (KLOR-CON) 20 MEQ tablet Take 1 tablet (20 mEq total) by  mouth daily. 02/17/21   Regalado, Belkys A, MD  Psyllium (METAMUCIL PO) Take 1 Scoop by mouth at bedtime as needed (constipation- mix and drink as directed).    [provider]  RESTASIS 0.05 % ophthalmic emulsion Place 1 drop into both eyes 2 (two) times daily.    [provider]  rosuvastatin (CRESTOR) 5 MG tablet Take 5 mg by mouth daily.    [provider]  vitamin E 200 UNIT capsule Take 200 Units by mouth daily.    [provider]      Allergies    Tape, Celexa [citalopram hydrobromide], Fluconazole, Green tea leaf ext, Kenalog [triamcinolone], Levaquin [levofloxacin], Losartan potassium-hctz, Magnesium citrate, Methocarbamol, Nitrofurantoin macrocrystal, Scopolamine, Sulfa antibiotics, and Trimethoprim    Review of Systems   Review of Systems  Constitutional:  Negative for fever.  Eyes:  Negative for visual disturbance.  Respiratory:  Negative for shortness of breath.   Cardiovascular:  Negative for chest pain.  Gastrointestinal:  Negative for abdominal pain.  Musculoskeletal:  Negative for neck pain.  Skin:  Positive for wound.  Neurological:  Positive for headaches. Negative for syncope, weakness and numbness.   Physical Exam Updated Vital Signs BP (!) 199/67 (BP Location: Right Arm)    Pulse 68    Temp 97.8 F (36.6 C)    Resp 16    SpO2 100%  Physical Exam Vitals and nursing note reviewed.  Constitutional:      General: She is not in acute distress.    Appearance: Normal appearance. She is well-developed.  HENT:     Head: Normocephalic.     Comments: She has some tenderness and a small laceration or abrasion to her right temple area about 2 cm. Eyes:     Conjunctiva/sclera: Conjunctivae normal.  Cardiovascular:     Rate and Rhythm: Normal rate and regular rhythm.     Heart sounds: No murmur heard. Pulmonary:     Effort: Pulmonary effort is normal. No respiratory distress.     Breath sounds: Normal breath sounds.  Abdominal:      Palpations: Abdomen is soft.     Tenderness: There is no abdominal tenderness.  Musculoskeletal:        General: No swelling or tenderness. Normal range of motion.     Cervical back: Neck supple. No tenderness.  Skin:    General: Skin is warm and dry.     Capillary Refill: Capillary refill takes less than 2 seconds.  Neurological:     General: No focal deficit present.     Mental Status: She is alert.     Gait: Gait normal.    ED Results / Procedures / Treatments   Labs (all labs ordered are listed, but only abnormal results are displayed) Labs Reviewed - No data to display  EKG None  Radiology CT Head Wo Contrast  Result Date: 01/29/2022 CLINICAL DATA:  Status post fall.  EXAM: CT HEAD WITHOUT CONTRAST TECHNIQUE: Contiguous axial images were obtained from the base of the skull through the vertex without intravenous contrast. RADIATION DOSE REDUCTION: This exam was performed according to the departmental dose-optimization program which includes automated exposure control, adjustment of the mA and/or kV according to patient size and/or use of iterative reconstruction technique. COMPARISON:  October 26, 2021 FINDINGS: Brain: There is mild cerebral atrophy with widening of the extra-axial spaces and ventricular dilatation. There are areas of decreased attenuation within the white matter tracts of the supratentorial brain, consistent with microvascular disease changes. Vascular: No hyperdense vessel or unexpected calcification. Skull: Normal. Negative for fracture or focal lesion. Sinuses/Orbits: No acute finding. Other: None. IMPRESSION: 1. No acute intracranial abnormality. 2. Generalized cerebral atrophy. Electronically Signed   By: Virgina Norfolk M.D.   On: 01/29/2022 19:53    Procedures Procedures    Medications Ordered in ED Medications - No data to display  ED Course/ Medical Decision Making/ A&P Clinical Course as of 01/30/22 0158  Fri Jan 29, 2022  1921 Patient up-to-date  on tetanus [MB]  1946 CT head ordered and interpreted by me as no acute fracture or bleed.  Awaiting radiology reading. [MB]  2018 I do not think the wound on her temple requires sutures and would be difficult to Dermabond due to her hair. [MB]    Clinical Course User Index [MB] Hayden Rasmussen, MD                           Medical Decision Making Amount and/or Complexity of Data Reviewed Radiology: ordered.  This patient complains of head injury after a fall; this involves an extensive number of treatment Options and is a complaint that carries with it a high risk of complications and morbidity. The differential includes laceration, contusion, skull fracture, intracranial bleed  I ordered imaging studies which included CT head and I independently    visualized and interpreted imaging which showed no acute fracture or bleed Additional history obtained from patient's son Previous records obtained and reviewed in epic, not on anticoagulation  After the interventions stated above, I reevaluated the patient and found patient be awake alert in no distress.  Blood pressure remains elevated.  Counseled patient to follows up with primary care doctor. Admission and further testing considered, no indications for admission at this time.  Patient instructed to follow-up with PCP.  Return instructions discussed          Final Clinical Impression(s) / ED Diagnoses Final diagnoses:  Fall, initial encounter  Traumatic injury of head, initial encounter  Laceration of scalp, initial encounter  Primary hypertension    Rx / DC Orders ED Discharge Orders     None         Hayden Rasmussen, MD 01/30/22 [phone]

## 2022-01-29 NOTE — Discharge Instructions (Signed)
You are seen in the emergency department for evaluation of injuries from a fall.  You had a small scalp laceration that did not require sutures.  You had a CAT scan of your head that did not show any internal bleeding or skull fracture.  You can use Tylenol for pain and use soap and water to your wound.  Return if any worsening or concerning symptoms.

## 2022-02-02 NOTE — Telephone Encounter (Signed)
I called patient and provided her with # to call Dr.Schroeder office.  Her office left message x1.

## 2022-02-03 ENCOUNTER — Encounter: Payer: Self-pay | Admitting: Podiatry

## 2022-02-03 ENCOUNTER — Other Ambulatory Visit: Payer: Self-pay

## 2022-02-03 ENCOUNTER — Ambulatory Visit: Payer: Medicare Other | Admitting: Podiatry

## 2022-02-03 DIAGNOSIS — M21622 Bunionette of left foot: Secondary | ICD-10-CM

## 2022-02-03 DIAGNOSIS — E1149 Type 2 diabetes mellitus with other diabetic neurological complication: Secondary | ICD-10-CM | POA: Diagnosis not present

## 2022-02-03 DIAGNOSIS — L84 Corns and callosities: Secondary | ICD-10-CM

## 2022-02-03 DIAGNOSIS — E1140 Type 2 diabetes mellitus with diabetic neuropathy, unspecified: Secondary | ICD-10-CM | POA: Diagnosis not present

## 2022-02-04 NOTE — Progress Notes (Signed)
Subjective:  ? ?Patient ID: Christina Sellers, female   DOB: 81 y.o.   MRN: [id number]  ? ?HPI ?Patient presents with 3 separate lesions plantar aspect left foot and states she is getting increased pain around the fifth metatarsal head left foot and she is interested as to what else can be done as its not getting better with treatment ? ? ?ROS ? ? ?   ?Objective:  ?Physical Exam  ?Neurovascular status intact with 3 separate lesions plantar aspect left second third and fifth metatarsal with lucent course that are painful and prominence of the fifth metatarsal head left painful when palpated ? ?   ?Assessment:  ?Tailor's bunion deformity left with lesion and 2 separate lesions plantar aspect left foot ? ?   ?Plan:  ?Sterile debridement of all lesions did have some bleeding the left fifth applied small amount of Neosporin with dressing and explained leaving the dressing on and then soak it.  Discussed tailor's bunion deformity and the consideration at this point for metatarsal head resection and I educated her on head resection and she will probably get this done even though her grandson is getting married in May and would like to wait until after that if possible ?   ? ? ?

## 2022-02-04 NOTE — Telephone Encounter (Signed)
Patient scheduled on 05/04/22 with Jaquita Folds, MD ?

## 2022-02-09 DIAGNOSIS — R6889 Other general symptoms and signs: Secondary | ICD-10-CM | POA: Diagnosis not present

## 2022-02-09 DIAGNOSIS — R6883 Chills (without fever): Secondary | ICD-10-CM | POA: Diagnosis not present

## 2022-02-09 DIAGNOSIS — I10 Essential (primary) hypertension: Secondary | ICD-10-CM | POA: Diagnosis not present

## 2022-02-09 DIAGNOSIS — R300 Dysuria: Secondary | ICD-10-CM | POA: Diagnosis not present

## 2022-02-24 DIAGNOSIS — I10 Essential (primary) hypertension: Secondary | ICD-10-CM | POA: Diagnosis not present

## 2022-02-24 DIAGNOSIS — R6883 Chills (without fever): Secondary | ICD-10-CM | POA: Diagnosis not present

## 2022-02-24 DIAGNOSIS — L84 Corns and callosities: Secondary | ICD-10-CM | POA: Diagnosis not present

## 2022-02-24 DIAGNOSIS — R7303 Prediabetes: Secondary | ICD-10-CM | POA: Diagnosis not present

## 2022-03-03 DIAGNOSIS — D225 Melanocytic nevi of trunk: Secondary | ICD-10-CM | POA: Diagnosis not present

## 2022-03-03 DIAGNOSIS — L570 Actinic keratosis: Secondary | ICD-10-CM | POA: Diagnosis not present

## 2022-03-03 DIAGNOSIS — L821 Other seborrheic keratosis: Secondary | ICD-10-CM | POA: Diagnosis not present

## 2022-03-03 DIAGNOSIS — D692 Other nonthrombocytopenic purpura: Secondary | ICD-10-CM | POA: Diagnosis not present

## 2022-03-03 DIAGNOSIS — I788 Other diseases of capillaries: Secondary | ICD-10-CM | POA: Diagnosis not present

## 2022-03-15 DIAGNOSIS — B029 Zoster without complications: Secondary | ICD-10-CM | POA: Diagnosis not present

## 2022-03-23 DIAGNOSIS — H04123 Dry eye syndrome of bilateral lacrimal glands: Secondary | ICD-10-CM | POA: Diagnosis not present

## 2022-03-23 DIAGNOSIS — H3563 Retinal hemorrhage, bilateral: Secondary | ICD-10-CM | POA: Diagnosis not present

## 2022-03-23 NOTE — Progress Notes (Signed)
?Cardiology Office Note:   ? ?Date:  03/24/2022  ? ?ID:  Christina Sellers, DOB 10-08-1941, MRN [id number] ? ?PCP:  Josetta Huddle, MD ? ?Provider Notes:  Hearing Impaired ?  ?[place]  ?Cardiologist:  Werner Lean, MD  ?Advanced Practice Provider:  No care team member to display ?Electrophysiologist:  None  ?    ?CC: Follow up PAD ? ?History of Present Illness:   ? ?Christina Sellers is a 81 y.o. female with a hx of PAD (CAS), hx of COPD with Asthma, HLD with T2DM with aortic atherosclerosis, HTN with DM who presents for evaluation 01/21/21. ?2022: Had syncope issues including syncope during an office visit.  Seen in the hospital during that admission and her diuretics and BB were stopped due to intolerance. Had right hip surgery 03/30/21.  Has done better of CCB and thiazide ? ?Patient notes that she is doing OK.   ?Has has LE swelling but has improved with compression stockings. ?Saw new discoloration and bruising on her left LE (painless). ?Has new Zoster that is quite painful. Occurs from R agm to hand. With new lesions. ?There are no interval hospital/ED visit.   ? ?No chest pair or pressure. ?Still having high blood pressures: log reviewed and only one BP < 140 in April. ?Notes she has some troubles taking in a big breath at times. ? ? ?Past Medical History:  ?Diagnosis Date  ? Abnormal Pap smear of vagina   ? Allergic sinusitis   ? Anxiety   ? Atherosclerotic peripheral vascular disease ([place])   ? Carotid artery stenosis   ? Cellulitis   ? COPD with asthma ([place])   ? not an issue now  ? Depression   ? Diabetes mellitus type 2, uncomplicated ([place])   ? DJD (degenerative joint disease)   ? Elevated cholesterol   ? Elevated MCV   ? secondary to alchol  ? GERD (gastroesophageal reflux disease)   ? not an issue now.  ? Gout   ? Hearing difficulty   ? bilateral hearing aids  ? History of recurrent UTIs   ? Hypertension   ? loss weight'off meds now"  ? Hypothyroidism   ? IBS (irritable  bowel syndrome)   ? Insomnia   ? Labial fusion   ? Osteoarthritis of left knee   ? Osteopenia   ? Recurrent vaginitis   ? Sjogren's syndrome ([place])   ? Syncope 11.7.14  ? secondary to dehydration and possible hypoglycemia  ? Tendonitis   ? Thyroid disease   ? Transfusion history   ? age 70 "anemia"  ? ? ?Past Surgical History:  ?Procedure Laterality Date  ? CATARACT EXTRACTION, BILATERAL Bilateral   ? CHOLECYSTECTOMY    ? open  ? COLONOSCOPY WITH PROPOFOL N/A 03/23/2016  ? Procedure: COLONOSCOPY WITH PROPOFOL;  Surgeon: Garlan Fair, MD;  Location: [facility] ENDOSCOPY;  Service: Endoscopy;  Laterality: N/A;  ? EYE SURGERY    ? FOOT SURGERY Left   ? retained hardware  ? KNEE SURGERY Left   ? scope   ? TONSILLECTOMY    ? TOTAL HIP ARTHROPLASTY Right 03/30/2021  ? Procedure: RIGHTTOTAL HIP ARTHROPLASTY ANTERIOR APPROACH;  Surgeon: Frederik Pear, MD;  Location: [facility] ORS;  Service: Orthopedics;  Laterality: Right;  ? ? ?Current Medications: ?Current Meds  ?Medication Sig  ? acetaminophen (TYLENOL) 650 MG CR tablet 2 tablets in the morning and at bedtime.  ? amLODipine (NORVASC) 2.5 MG tablet Take 2.5 mg by mouth daily.  ?  ascorbic acid (VITAMIN C) 500 MG tablet Take 500 mg by mouth daily.  ? Biotin 1 MG CAPS as needed.  ? CALCIUM 600 1500 (600 Ca) MG TABS tablet daily.  ? Cholecalciferol (VITAMIN D3) 50 MCG (2000 UT) capsule Take 2,000 mg by mouth daily.  ? clobetasol ointment (TEMOVATE) 0.05 % Apply externally daily x 2 weeks, then decrease to every other day x 2 weeks, then twice weekly  ? Cyanocobalamin (VITAMIN B12) 1000 MCG TBCR daily.  ? denosumab (PROLIA) 60 MG/ML SOSY injection Inject 60 mg into the skin every 6 (six) months.  ? diclofenac sodium (VOLTAREN) 1 % GEL Apply 1 application topically 2 (two) times daily as needed (to painful sites).  ? feeding supplement (ENSURE ENLIVE / ENSURE PLUS) LIQD Take 237 mLs by mouth 2 (two) times daily between meals.  ? finasteride (PROSCAR) 5 MG tablet Take 2.5 mg by mouth daily.  ?  fluticasone (FLONASE) 50 MCG/ACT nasal spray Place 1 spray into both nostrils daily as needed (for seasonal allergies).  ? hydrALAZINE (APRESOLINE) 25 MG tablet Take 1 tablet (25 mg total) by mouth 2 (two) times daily.  ? hydrocortisone 2.5 % cream Apply topically as needed.  ? hypromellose (SYSTANE OVERNIGHT THERAPY) 0.3 % GEL ophthalmic ointment Place 1 application into both eyes at bedtime as needed for dry eyes.  ? levothyroxine (SYNTHROID) 75 MCG tablet Take 75 mcg by mouth every morning.  ? meloxicam (MOBIC) 15 MG tablet Take 15 mg by mouth every other day.  ? Menthol, Topical Analgesic, (BIOFREEZE EX) Apply 1 application topically 2 (two) times daily as needed (for knee pain).  ? methocarbamol (ROBAXIN) 500 MG tablet Take 250 mg by mouth at bedtime.  ? Methylcobalamin 1000 MCG TBDP Take 1,000 mcg by mouth daily.  ? Multiple Vitamins-Minerals (MULTIVITAMIN WITH MINERALS) tablet Take 1 tablet by mouth daily.  ? nystatin cream (MYCOSTATIN) Apply 1 application topically 2 (two) times daily.  ? OVER THE COUNTER MEDICATION Take 2 capsules by mouth daily. [facility]  ? Polyvinyl Alcohol-Povidone PF (REFRESH) 1.4-0.6 % SOLN as needed.  ? potassium chloride SA (KLOR-CON) 20 MEQ tablet Take 1 tablet (20 mEq total) by mouth daily.  ? Psyllium (METAMUCIL PO) Take 1 Scoop by mouth at bedtime as needed (constipation- mix and drink as directed).  ? RESTASIS 0.05 % ophthalmic emulsion Place 1 drop into both eyes 2 (two) times daily.  ? rosuvastatin (CRESTOR) 5 MG tablet Take 5 mg by mouth daily.  ? valACYclovir (VALTREX) 1000 MG tablet Take 1,000 mg by mouth 3 (three) times daily.  ? vitamin E 200 UNIT capsule Take 200 Units by mouth daily.  ? [DISCONTINUED] amLODipine (NORVASC) 5 MG tablet Take 5 mg by mouth daily.  ? [DISCONTINUED] hydrALAZINE (APRESOLINE) 10 MG tablet As need for SBP > 160  ?  ? ?Allergies:   Tape, Celexa [citalopram hydrobromide], Fluconazole, Green tea leaf ext, Kenalog [triamcinolone], Levaquin  [levofloxacin], Losartan potassium-hctz, Magnesium citrate, Methocarbamol, Nitrofurantoin macrocrystal, Scopolamine, Sulfa antibiotics, and Trimethoprim  ? ?Social History  ? ?Socioeconomic History  ? Marital status: Widowed  ?  Spouse name: Not on file  ? Number of children: Not on file  ? Years of education: Not on file  ? Highest education level: Not on file  ?Occupational History  ? Not on file  ?Tobacco Use  ? Smoking status: Former  ?  Types: Cigarettes  ?  Quit date: 02/21/1982  ?  Years since quitting: 40.1  ? Smokeless tobacco: Never  ?Vaping  Use  ? Vaping Use: Never used  ?Substance and Sexual Activity  ? Alcohol use: No  ? Drug use: No  ? Sexual activity: Never  ?  Birth control/protection: Post-menopausal  ?Other Topics Concern  ? Not on file  ?Social History Narrative  ? Not on file  ? ?Social Determinants of Health  ? ?Financial Resource Strain: Not on file  ?Food Insecurity: Not on file  ?Transportation Needs: Not on file  ?Physical Activity: Not on file  ?Stress: Not on file  ?Social Connections: Not on file  ?  ?Social:  Daughter is in [place] and had two kids' she has multiple family members one who graduated dental school (grandson in [place]) ? ?Family History: ?The patient's family history includes Breast cancer in her sister; Cancer in her brother and father; Diabetes in her maternal aunt. ? ?ROS:   ?Please see the history of present illness.  ? All other systems reviewed and are negative. ? ?EKGs/Labs/Other Studies Reviewed:   ? ?The following studies were reviewed today: ? ?EKG:   ?03/24/22: SR 66 ?01/21/21: SR 65 WNL ? ?Transthoracic Echocardiogram: ?Date: 02/02/21 ?Results: ? 1. Left ventricular ejection fraction, by estimation, is 65 to 70%. The  ?left ventricle has normal function. The left ventricle has no regional  ?wall motion abnormalities. Left ventricular diastolic parameters are  ?consistent with Grade I diastolic  ?dysfunction (impaired relaxation).  ? 2. Right ventricular systolic  function is normal. The right ventricular  ?size is mildly enlarged. There is normal pulmonary artery systolic  ?pressure.  ? 3. Left atrial size was mild to moderately dilated.  ? 4. The mitral valve is norma

## 2022-03-24 ENCOUNTER — Encounter: Payer: Self-pay | Admitting: Internal Medicine

## 2022-03-24 ENCOUNTER — Ambulatory Visit (INDEPENDENT_AMBULATORY_CARE_PROVIDER_SITE_OTHER): Payer: Medicare Other | Admitting: Internal Medicine

## 2022-03-24 VITALS — BP 185/70 | HR 66 | Ht <= 58 in | Wt 136.0 lb

## 2022-03-24 DIAGNOSIS — I361 Nonrheumatic tricuspid (valve) insufficiency: Secondary | ICD-10-CM | POA: Diagnosis not present

## 2022-03-24 DIAGNOSIS — R0609 Other forms of dyspnea: Secondary | ICD-10-CM | POA: Diagnosis not present

## 2022-03-24 DIAGNOSIS — E1159 Type 2 diabetes mellitus with other circulatory complications: Secondary | ICD-10-CM

## 2022-03-24 DIAGNOSIS — I152 Hypertension secondary to endocrine disorders: Secondary | ICD-10-CM

## 2022-03-24 MED ORDER — HYDRALAZINE HCL 25 MG PO TABS: 25.0000 mg | ORAL_TABLET | Freq: Two times a day (BID) | ORAL | 3 refills | Status: DC

## 2022-03-24 NOTE — Patient Instructions (Signed)
Medication Instructions:  ?Your physician has recommended you make the following change in your medication:  ?INCREASE: hydralazine to 25 mg by mouth twice daily ? ?*If you need a refill on your cardiac medications before your next appointment, please call your pharmacy* ? ? ?Lab Work: ?NONE ?If you have labs (blood work) drawn today and your tests are completely normal, you will receive your results only by: ?MyChart Message (if you have MyChart) OR ?A paper copy in the mail ?If you have any lab test that is abnormal or we need to change your treatment, we will call you to review the results. ? ? ?Testing/Procedures: ?Your physician has requested that you have an echocardiogram. Echocardiography is a painless test that uses sound waves to create images of your heart. It provides your doctor with information about the size and shape of your heart and how well your heart?s chambers and valves are working. This procedure takes approximately one hour. There are no restrictions for this procedure. ? ? ? ?Follow-Up: ?At [facility], you and your health needs are our priority.  As part of our continuing mission to provide you with exceptional heart care, we have created designated Provider Care Teams.  These Care Teams include your primary Cardiologist (physician) and Advanced Practice Providers (APPs -  Physician Assistants and Nurse Practitioners) who all work together to provide you with the care you need, when you need it. ? ?We recommend signing up for the patient portal called "MyChart".  Sign up information is provided on this After Visit Summary.  MyChart is used to connect with patients for Virtual Visits (Telemedicine).  Patients are able to view lab/test results, encounter notes, upcoming appointments, etc.  Non-urgent messages can be sent to your provider as well.   ?To learn more about what you can do with MyChart, go to [email or web address].   ? ?Your next appointment:   ?6 month(s) ? ?The format for  your next appointment:   ?In Person ? ?Provider:   ?Werner Lean, MD   ? ? ?Important Information About Sugar ? ? ? ? ?  ?

## 2022-03-29 DIAGNOSIS — Z780 Asymptomatic menopausal state: Secondary | ICD-10-CM | POA: Diagnosis not present

## 2022-03-29 DIAGNOSIS — Z1231 Encounter for screening mammogram for malignant neoplasm of breast: Secondary | ICD-10-CM | POA: Diagnosis not present

## 2022-03-29 DIAGNOSIS — M85852 Other specified disorders of bone density and structure, left thigh: Secondary | ICD-10-CM | POA: Diagnosis not present

## 2022-03-31 DIAGNOSIS — I10 Essential (primary) hypertension: Secondary | ICD-10-CM | POA: Diagnosis not present

## 2022-04-06 DIAGNOSIS — I10 Essential (primary) hypertension: Secondary | ICD-10-CM | POA: Diagnosis not present

## 2022-04-08 ENCOUNTER — Ambulatory Visit (HOSPITAL_COMMUNITY): Payer: Medicare Other | Attending: Cardiology

## 2022-04-08 ENCOUNTER — Telehealth: Payer: Self-pay | Admitting: Internal Medicine

## 2022-04-08 DIAGNOSIS — I361 Nonrheumatic tricuspid (valve) insufficiency: Secondary | ICD-10-CM | POA: Insufficient documentation

## 2022-04-08 DIAGNOSIS — R0609 Other forms of dyspnea: Secondary | ICD-10-CM | POA: Diagnosis not present

## 2022-04-08 LAB — ECHOCARDIOGRAM COMPLETE
Area-P 1/2: 3.85 cm2
S' Lateral: 2.9 cm

## 2022-04-08 NOTE — Telephone Encounter (Signed)
New Message: ? ? ? ? ? ? ?Patient have Echo this morning, she wants to know if she can still take her morning medicine? ?

## 2022-04-08 NOTE — Telephone Encounter (Signed)
Advised patient she can eat, drink and take her medication with no restrictions prior to echo.  ?Verbalized understanding.  ?

## 2022-04-10 ENCOUNTER — Other Ambulatory Visit: Payer: Self-pay

## 2022-04-10 ENCOUNTER — Emergency Department (HOSPITAL_BASED_OUTPATIENT_CLINIC_OR_DEPARTMENT_OTHER): Payer: Medicare Other | Admitting: Radiology

## 2022-04-10 ENCOUNTER — Emergency Department (HOSPITAL_BASED_OUTPATIENT_CLINIC_OR_DEPARTMENT_OTHER)
Admission: EM | Admit: 2022-04-10 | Discharge: 2022-04-10 | Disposition: A | Discharge status: Home / Self Care | Payer: Medicare Other | Attending: Emergency Medicine | Admitting: Emergency Medicine

## 2022-04-10 DIAGNOSIS — R7309 Other abnormal glucose: Secondary | ICD-10-CM | POA: Insufficient documentation

## 2022-04-10 DIAGNOSIS — R600 Localized edema: Secondary | ICD-10-CM | POA: Diagnosis not present

## 2022-04-10 DIAGNOSIS — M7989 Other specified soft tissue disorders: Secondary | ICD-10-CM

## 2022-04-10 DIAGNOSIS — D649 Anemia, unspecified: Secondary | ICD-10-CM | POA: Insufficient documentation

## 2022-04-10 DIAGNOSIS — R7989 Other specified abnormal findings of blood chemistry: Secondary | ICD-10-CM | POA: Insufficient documentation

## 2022-04-10 DIAGNOSIS — E039 Hypothyroidism, unspecified: Secondary | ICD-10-CM | POA: Insufficient documentation

## 2022-04-10 DIAGNOSIS — Z79899 Other long term (current) drug therapy: Secondary | ICD-10-CM | POA: Diagnosis not present

## 2022-04-10 DIAGNOSIS — J449 Chronic obstructive pulmonary disease, unspecified: Secondary | ICD-10-CM | POA: Insufficient documentation

## 2022-04-10 DIAGNOSIS — R2243 Localized swelling, mass and lump, lower limb, bilateral: Secondary | ICD-10-CM | POA: Diagnosis not present

## 2022-04-10 LAB — CBC WITH DIFFERENTIAL/PLATELET
Abs Immature Granulocytes: 0.04 10*3/uL (ref 0.00–0.07)
Basophils Absolute: 0.1 10*3/uL (ref 0.0–0.1)
Basophils Relative: 1 %
Eosinophils Absolute: 0.1 10*3/uL (ref 0.0–0.5)
Eosinophils Relative: 2 %
HCT: 34.9 % — ABNORMAL LOW (ref 36.0–46.0)
Hemoglobin: 11.5 g/dL — ABNORMAL LOW (ref 12.0–15.0)
Immature Granulocytes: 1 %
Lymphocytes Relative: 25 %
Lymphs Abs: 2 10*3/uL (ref 0.7–4.0)
MCH: 34 pg (ref 26.0–34.0)
MCHC: 33 g/dL (ref 30.0–36.0)
MCV: 103.3 fL — ABNORMAL HIGH (ref 80.0–100.0)
Monocytes Absolute: 0.8 10*3/uL (ref 0.1–1.0)
Monocytes Relative: 11 %
Neutro Abs: 4.8 10*3/uL (ref 1.7–7.7)
Neutrophils Relative %: 60 %
Platelets: 174 10*3/uL (ref 150–400)
RBC: 3.38 MIL/uL — ABNORMAL LOW (ref 3.87–5.11)
RDW: 16.2 % — ABNORMAL HIGH (ref 11.5–15.5)
WBC: 7.9 10*3/uL (ref 4.0–10.5)
nRBC: 0 % (ref 0.0–0.2)

## 2022-04-10 LAB — BASIC METABOLIC PANEL
Anion gap: 11 (ref 5–15)
BUN: 39 mg/dL — ABNORMAL HIGH (ref 8–23)
CO2: 22 mmol/L (ref 22–32)
Calcium: 10.1 mg/dL (ref 8.9–10.3)
Chloride: 105 mmol/L (ref 98–111)
Creatinine, Ser: 1 mg/dL (ref 0.44–1.00)
GFR, Estimated: 56 mL/min — ABNORMAL LOW (ref >60)
Glucose, Bld: 101 mg/dL — ABNORMAL HIGH (ref 70–99)
Potassium: 4.4 mmol/L (ref 3.5–5.1)
Sodium: 138 mmol/L (ref 135–145)

## 2022-04-10 LAB — BRAIN NATRIURETIC PEPTIDE: B Natriuretic Peptide: 249.4 pg/mL — ABNORMAL HIGH (ref 0.0–100.0)

## 2022-04-10 MED ORDER — FUROSEMIDE 20 MG PO TABS: 20.0000 mg | ORAL_TABLET | ORAL | 0 refills | Status: DC | PRN

## 2022-04-10 MED ORDER — FUROSEMIDE 20 MG PO TABS
20.0000 mg | ORAL_TABLET | Freq: Once | ORAL | Status: AC
  Administered 2022-04-10: 20 mg via ORAL
  Filled 2022-04-10: qty 1

## 2022-04-10 MED ORDER — CEPHALEXIN 500 MG PO CAPS: 500.0000 mg | ORAL_CAPSULE | Freq: Two times a day (BID) | ORAL | 0 refills | Status: DC

## 2022-04-10 NOTE — ED Triage Notes (Signed)
Pt from home c/o bilateral lower extremity swelling. Was at Cardiology yesterday for Echo, wore compression stockings and noticed swelling last night. Denies SOB, no chest pain. Pt denies history of CHF. Pt drove self today. ?

## 2022-04-10 NOTE — ED Notes (Signed)
Pt provided discharge instructions and prescription information. Pt was given the opportunity to ask questions and questions were answered.   

## 2022-04-10 NOTE — Discharge Instructions (Addendum)
Return to the ED with any new symptoms such as shortness of breath, cough, lightheadedness or dizziness ?Please follow-up with your PCP for ongoing needs ?Please begin taking the antibiotic I prescribed you.  You will need to take this antibiotic 2 times daily for the next 7 days ?Please take the diuretic I prescribed you as needed.  If you feel as if your legs are tight/swollen you may take this medication as needed ?

## 2022-04-10 NOTE — ED Provider Notes (Signed)
?[facility] ?Provider Note ? ? ?CSN: [id number] ?Arrival date & time: 04/10/22  1550 ? ?  ? ?History ? ?Chief Complaint  ?Patient presents with  ? Leg Swelling  ? ? ?Christina Sellers is a 81 y.o. female with medical history that includes anxiety, COPD, degenerative joint disease, GERD, gout, hearing difficulty, IBS, hypothyroidism, IBS, Sjogren syndrome, PAD.  Patient presents ED for evaluation of bilateral lower extremity swelling.  Patient states that this is a known problem for her.  The patient reports that typically in the morning when she wakes up, her leg swelling decreases.  Patient states that today however the leg swelling did not decrease which raised concern.  Patient recently seen on 4/19 by cardiologist, noted to have bilateral lower extremity swelling at this point as well.  Dr. Durward Fortes compression stockings at this time which patient states that she has been wearing every day.  Patient is endorsing lower leg swelling however she denies chest pain, shortness of breath, nausea, vomiting, lightheadedness, dizziness or weakness.  Patient denies fevers.  Patient denies headache, visual disturbance ?HPI ? ?  ? ?Home Medications ?Prior to Admission medications   ?Medication Sig Start Date End Date Taking? Authorizing Provider  ?cephALEXin (KEFLEX) 500 MG capsule Take 1 capsule (500 mg total) by mouth 2 (two) times daily. 04/10/22  Yes Azucena Cecil, PA-C  ?furosemide (LASIX) 20 MG tablet Take 1 tablet (20 mg total) by mouth as needed for edema. 04/10/22  Yes Azucena Cecil, PA-C  ?acetaminophen (TYLENOL) 650 MG CR tablet 2 tablets in the morning and at bedtime.    [provider]  ?amLODipine (NORVASC) 2.5 MG tablet Take 2.5 mg by mouth daily. 03/01/22   [provider]  ?ascorbic acid (VITAMIN C) 500 MG tablet Take 500 mg by mouth daily.    [provider]  ?Biotin 1 MG CAPS as needed. 01/27/22   [provider]  ?CALCIUM 600 1500 (600  Ca) MG TABS tablet daily. 01/27/22   [provider]  ?Cholecalciferol (VITAMIN D3) 50 MCG (2000 UT) capsule Take 2,000 mg by mouth daily.    [provider]  ?clobetasol ointment (TEMOVATE) 0.05 % Apply externally daily x 2 weeks, then decrease to every other day x 2 weeks, then twice weekly 01/26/22   Tamela Gammon, NP  ?Cyanocobalamin (VITAMIN B12) 1000 MCG TBCR daily.    [provider]  ?denosumab (PROLIA) 60 MG/ML SOSY injection Inject 60 mg into the skin every 6 (six) months.    [provider]  ?diclofenac sodium (VOLTAREN) 1 % GEL Apply 1 application topically 2 (two) times daily as needed (to painful sites). 03/29/19   [provider]  ?feeding supplement (ENSURE ENLIVE / ENSURE PLUS) LIQD Take 237 mLs by mouth 2 (two) times daily between meals. 02/17/21   Regalado, Belkys A, MD  ?finasteride (PROSCAR) 5 MG tablet Take 2.5 mg by mouth daily. 02/19/21   [provider]  ?fluticasone (FLONASE) 50 MCG/ACT nasal spray Place 1 spray into both nostrils daily as needed (for seasonal allergies).    [provider]  ?hydrALAZINE (APRESOLINE) 25 MG tablet Take 1 tablet (25 mg total) by mouth 2 (two) times daily. 03/24/22   Rudean Haskell A, MD  ?hydrocortisone 2.5 % cream Apply topically as needed. 01/27/22   [provider]  ?hypromellose (SYSTANE OVERNIGHT THERAPY) 0.3 % GEL ophthalmic ointment Place 1 application into both eyes at bedtime as needed for dry eyes.    [provider]  ?levothyroxine (SYNTHROID) 75 MCG tablet Take 75 mcg by mouth every morning. 07/15/21   [provider]  ?meloxicam (MOBIC) 15 MG tablet Take 15 mg by mouth every other day. 01/20/22   [provider]  ?Menthol, Topical Analgesic, (BIOFREEZE EX) Apply 1 application topically 2 (two) times daily as needed (for knee pain).    [provider]  ?methocarbamol (ROBAXIN) 500 MG tablet Take 250 mg by mouth at bedtime.    [provider]  ?Methylcobalamin 1000 MCG TBDP Take 1,000 mcg by mouth daily.    [provider]  ?Multiple Vitamins-Minerals (MULTIVITAMIN WITH MINERALS) tablet Take 1 tablet by mouth daily.    [provider]  ?nystatin cream (MYCOSTATIN) Apply 1 application topically 2 (two) times daily. 04/28/21   Tamela Gammon, NP  ?OVER THE COUNTER MEDICATION Take 2 capsules by mouth daily. [facility]    [provider]  ?Polyvinyl Alcohol-Povidone PF (REFRESH) 1.4-0.6 % SOLN as needed.    [provider]  ?potassium chloride SA (KLOR-CON) 20 MEQ tablet Take 1 tablet (20 mEq total) by mouth daily. 02/17/21   Regalado, Belkys A, MD  ?Psyllium (METAMUCIL PO) Take 1 Scoop by mouth at bedtime as needed (constipation- mix and drink as directed).    [provider]  ?RESTASIS 0.05 % ophthalmic emulsion Place 1 drop into both eyes 2 (two) times daily.    [provider]  ?rosuvastatin (CRESTOR) 5 MG tablet Take 5 mg by mouth daily.    [provider]  ?valACYclovir (VALTREX) 1000 MG tablet Take 1,000 mg by mouth 3 (three) times daily. 03/23/22   [provider]  ?vitamin E 200 UNIT capsule Take 200 Units by mouth daily.    [provider]  ?   ? ?Allergies    ?Tape, Celexa [citalopram hydrobromide], Fluconazole, Green tea leaf ext, Kenalog [triamcinolone], Levaquin [levofloxacin], Losartan potassium-hctz, Magnesium citrate, Methocarbamol, Nitrofurantoin macrocrystal, Scopolamine, Sulfa antibiotics, and Trimethoprim   ? ?Review of Systems   ?Review of Systems  ?Constitutional:  Negative for fever.  ?Eyes:  Negative for visual disturbance.  ?Respiratory:  Negative for shortness of breath.   ?Cardiovascular:  Positive for leg swelling. Negative for chest pain.  ?Gastrointestinal:  Negative for nausea and vomiting.  ?Neurological:  Negative for headaches.  ?All other systems reviewed and are negative. ? ?Physical Exam ?Updated Vital Signs ?BP (!) 164/64    Pulse 85   Temp 98.2 ?F (36.8 ?C) (Oral)   Resp 13   Ht '4\' 9"'$  (1.448 m)   Wt 59 kg   SpO2 95%   BMI 28.13 kg/m?  ?Physical Exam ?Vitals and nursing note reviewed.  ?Constitutional:   ?   General: She is not in acute distress. ?   Appearance: Normal appearance. She is not ill-appearing, toxic-appearing or diaphoretic.  ?HENT:  ?   Head: Normocephalic and atraumatic.  ?   Nose: Nose normal. No congestion.  ?   Mouth/Throat:  ?   Mouth: Mucous membranes are moist.  ?   Pharynx: Oropharynx is clear.  ?Eyes:  ?   Extraocular Movements: Extraocular movements intact.  ?   Conjunctiva/sclera: Conjunctivae normal.  ?   Pupils: Pupils are equal, round, and reactive to light.  ?Cardiovascular:  ?   Rate and Rhythm: Normal rate and regular rhythm.  ?Pulmonary:  ?   Effort: Pulmonary effort is normal.  ?   Breath sounds: Normal breath sounds. No rhonchi or rales.  ?Abdominal:  ?  General: Abdomen is flat. Bowel sounds are normal.  ?   Palpations: Abdomen is soft.  ?   Tenderness: There is no abdominal tenderness.  ?Musculoskeletal:  ?   Cervical back: Normal range of motion and neck supple. No tenderness.  ?   Right lower leg: 2+ Pitting Edema present.  ?   Left lower leg: 2+ Pitting Edema present.  ?Skin: ?   General: Skin is warm and dry.  ?   Capillary Refill: Capillary refill takes less than 2 seconds.  ?   Comments: Increased erythema and warmth noted to patient bilateral shins  ?Neurological:  ?   Mental Status: She is alert and oriented to person, place, and time.  ? ? ?ED Results / Procedures / Treatments   ?Labs ?(all labs ordered are listed, but only abnormal results are displayed) ?Labs Reviewed  ?BASIC METABOLIC PANEL - Abnormal; Notable for the following components:  ?    Result Value  ? Glucose, Bld 101 (*)   ? BUN 39 (*)   ? GFR, Estimated 56 (*)   ? All other components within normal limits  ?CBC WITH DIFFERENTIAL/PLATELET - Abnormal; Notable for the following components:  ? RBC 3.38 (*)   ? Hemoglobin  11.5 (*)   ? HCT 34.9 (*)   ? MCV 103.3 (*)   ? RDW 16.2 (*)   ? All other components within normal limits  ?BRAIN NATRIURETIC PEPTIDE - Abnormal; Notable for the following components:  ? B Natriuretic Peptide 24

## 2022-04-13 ENCOUNTER — Telehealth: Payer: Self-pay | Admitting: Internal Medicine

## 2022-04-13 DIAGNOSIS — D692 Other nonthrombocytopenic purpura: Secondary | ICD-10-CM | POA: Diagnosis not present

## 2022-04-13 DIAGNOSIS — I10 Essential (primary) hypertension: Secondary | ICD-10-CM | POA: Diagnosis not present

## 2022-04-13 DIAGNOSIS — L0390 Cellulitis, unspecified: Secondary | ICD-10-CM | POA: Diagnosis not present

## 2022-04-13 NOTE — Telephone Encounter (Signed)
See result note.  

## 2022-04-13 NOTE — Telephone Encounter (Signed)
Patient returning call in regards to echo results ?

## 2022-04-13 NOTE — Addendum Note (Signed)
Addended by: Darrell Jewel on: 04/13/2022 02:07 PM ? ? Modules accepted: Orders ? ?

## 2022-04-15 DIAGNOSIS — D692 Other nonthrombocytopenic purpura: Secondary | ICD-10-CM | POA: Diagnosis not present

## 2022-04-15 DIAGNOSIS — L0390 Cellulitis, unspecified: Secondary | ICD-10-CM | POA: Diagnosis not present

## 2022-04-15 DIAGNOSIS — I10 Essential (primary) hypertension: Secondary | ICD-10-CM | POA: Diagnosis not present

## 2022-04-15 DIAGNOSIS — R600 Localized edema: Secondary | ICD-10-CM | POA: Diagnosis not present

## 2022-05-04 ENCOUNTER — Ambulatory Visit (INDEPENDENT_AMBULATORY_CARE_PROVIDER_SITE_OTHER): Payer: Medicare Other | Admitting: Obstetrics and Gynecology

## 2022-05-04 ENCOUNTER — Encounter: Payer: Self-pay | Admitting: Obstetrics and Gynecology

## 2022-05-04 VITALS — BP 177/73 | HR 66 | Resp 14 | Ht <= 58 in | Wt 137.0 lb

## 2022-05-04 DIAGNOSIS — N8110 Cystocele, unspecified: Secondary | ICD-10-CM

## 2022-05-04 DIAGNOSIS — N812 Incomplete uterovaginal prolapse: Secondary | ICD-10-CM

## 2022-05-04 DIAGNOSIS — K5904 Chronic idiopathic constipation: Secondary | ICD-10-CM

## 2022-05-04 NOTE — Progress Notes (Signed)
[facility] New Patient Evaluation and Consultation  Referring Provider: Tamela Gammon, NP PCP: Josetta Huddle, MD Date of Service: 05/04/2022  SUBJECTIVE Chief Complaint: New Patient (Initial Visit) (New patient referred by Marny Lowenstein, NP for prolapse-- pt states she thinks due to constipation issues/ec)  History of Present Illness: Christina Sellers is a 81 y.o. White or Caucasian female seen in consultation at the request of NP Tiffancy Juleen China for evaluation of prolapse.    Review of records from NP [facility] significant for: Patient noted a knot in the vaginal area for several months- noted to be prolapse. Not interested in surgery.   Urinary Symptoms: Leaks urine with exercise and with movement to the bathroom Does not leak every day Pad use: 1 pads per day.   She is bothered by her UI symptoms.  Day time voids- every few hours.  Nocturia: 2 times per night to void. Voiding dysfunction: she empties her bladder well.  does not use a catheter to empty bladder.  When urinating, she feels a weak stream  UTIs:  0  UTI's in the last year.   Denies history of blood in urine and kidney or bladder stones  Pelvic Organ Prolapse Symptoms:                  She Admits to a feeling of a bulge the vaginal area. It has been present for a few years. She Admits to seeing a bulge.  This bulge is bothersome. Notices it more with constipation.   Bowel Symptom: Bowel movements: a few time(s) per week Stool consistency: hard or soft  "marbles"  Straining: yes.  Splinting: yes.  Incomplete evacuation: no.  She Denies accidental bowel leakage / fecal incontinence Bowel regimen: stool softener, metamucil and prune juice   Sexual Function Sexually active: no.   Pelvic Pain Denies pelvic pain  Past Medical History:  Past Medical History:  Diagnosis Date   Abnormal Pap smear of vagina    Allergic sinusitis    Anxiety    Atherosclerotic peripheral vascular disease  (HCC)    Carotid artery stenosis    Cellulitis    COPD with asthma ([place])    not an issue now   Depression    Diabetes mellitus type 2, uncomplicated (HCC)    DJD (degenerative joint disease)    Elevated cholesterol    Elevated MCV    secondary to alchol   GERD (gastroesophageal reflux disease)    not an issue now.   Gout    Hearing difficulty    bilateral hearing aids   History of recurrent UTIs    Hypertension    loss weight'off meds now"   Hypothyroidism    IBS (irritable bowel syndrome)    Insomnia    Labial fusion    Osteoarthritis of left knee    Osteopenia    Recurrent vaginitis    Sjogren's syndrome ([place])    Syncope 11.7.14   secondary to dehydration and possible hypoglycemia   Tendonitis    Thyroid disease    Transfusion history    age 46 "anemia"     Past Surgical History:   Past Surgical History:  Procedure Laterality Date   CATARACT EXTRACTION, BILATERAL Bilateral    CHOLECYSTECTOMY     open   COLONOSCOPY WITH PROPOFOL N/A 03/23/2016   Procedure: COLONOSCOPY WITH PROPOFOL;  Surgeon: Garlan Fair, MD;  Location: [facility];  Service: Endoscopy;  Laterality: N/A;   EYE SURGERY     FOOT  SURGERY Left    retained hardware   KNEE SURGERY Left    scope    TONSILLECTOMY     TOTAL HIP ARTHROPLASTY Right 03/30/2021   Procedure: RIGHTTOTAL HIP ARTHROPLASTY ANTERIOR APPROACH;  Surgeon: Frederik Pear, MD;  Location: [facility] ORS;  Service: Orthopedics;  Laterality: Right;     Past OB/GYN History: OB History  Gravida Para Term Preterm AB Living  '2 2 2     3  '$ SAB IAB Ectopic Multiple Live Births        1      # Outcome Date GA Lbr Len/2nd Weight Sex Delivery Anes PTL Lv  2 Term           1 Term            Menopausal: Denies vaginal bleeding since menopause   Any history of abnormal pap smears: no.   Medications: She has a current medication list which includes the following prescription(s): acetaminophen, amlodipine, ascorbic acid, calcium 600, vitamin  d3, clobetasol ointment, vitamin b12, denosumab, diclofenac sodium, feeding supplement, finasteride, fluticasone, furosemide, hydrocortisone, systane overnight therapy, levothyroxine, meloxicam, methocarbamol, methylcobalamin, multivitamin with minerals, nystatin cream, OVER THE COUNTER MEDICATION, refresh, potassium chloride sa, psyllium, restasis, rosuvastatin, valacyclovir, and vitamin e.   Allergies: Patient is allergic to tape, celexa [citalopram hydrobromide], fluconazole, green tea leaf ext, kenalog [triamcinolone], levaquin [levofloxacin], losartan potassium-hctz, magnesium citrate, methocarbamol, nitrofurantoin macrocrystal, scopolamine, sulfa antibiotics, and trimethoprim.   Social History:  Social History   Tobacco Use   Smoking status: Former    Types: Cigarettes    Quit date: 02/21/1982    Years since quitting: 40.2   Smokeless tobacco: Never  Vaping Use   Vaping Use: Never used  Substance Use Topics   Alcohol use: No   Drug use: No    Relationship status: widowed She lives with her dog.   She is not employed. Regular exercise: Yes: yoga History of abuse: No  Family History:   Family History  Problem Relation Age of Onset   Diabetes Maternal Aunt    Cancer Father        Oral cancer   Breast cancer Sister        Age 47's   Cancer Brother        Lung cancer     Review of Systems: Review of Systems  Constitutional:  Negative for fever, malaise/fatigue and weight loss.  Respiratory:  Negative for cough, shortness of breath and wheezing.   Cardiovascular:  Negative for chest pain, palpitations and leg swelling.  Gastrointestinal:  Negative for abdominal pain and blood in stool.  Genitourinary:  Negative for dysuria.  Musculoskeletal:  Positive for myalgias.  Skin:  Negative for rash.  Neurological:  Negative for dizziness and headaches.  Endo/Heme/Allergies:  Does not bruise/bleed easily.  Psychiatric/Behavioral:  Negative for depression. The patient is not  nervous/anxious.     OBJECTIVE Physical Exam: Vitals:   05/04/22 1322  BP: (!) 177/73  Pulse: 66  Resp: 14  SpO2: 98%  Weight: 137 lb (62.1 kg)  Height: '4\' 9"'$  (1.448 m)    Physical Exam Constitutional:      General: She is not in acute distress. Pulmonary:     Effort: Pulmonary effort is normal.  Abdominal:     General: There is no distension.     Palpations: Abdomen is soft.     Tenderness: There is no abdominal tenderness. There is no rebound.  Musculoskeletal:        General: No swelling.  Normal range of motion.  Skin:    General: Skin is warm and dry.     Findings: No rash.  Neurological:     Mental Status: She is alert and oriented to person, place, and time.  Psychiatric:        Mood and Affect: Mood normal.        Behavior: Behavior normal.     GU / Detailed Urogynecologic Evaluation:  Pelvic Exam: Fusion of labia superiorly; Bartholin's and Skene's glands normal in appearance; urethral meatus normal in appearance, no urethral masses or discharge.   CST: negative  Small introitus due to labial fusion- pediatric speculum used. Speculum exam reveals normal vaginal mucosa with atrophy. Cervix normal appearance. Uterus normal single, nontender. Adnexa no mass, fullness, tenderness.    Pelvic floor strength I/V  Pelvic floor musculature: Right levator non-tender, Right obturator non-tender, Left levator non-tender, Left obturator non-tender  POP-Q:   POP-Q  -1                                            Aa   -1                                           Ba  -4                                              C   2                                            Gh  3.5                                            Pb  7                                            tvl   -2.5                                            Ap  -2.5                                            Bp  -7                                              D     Rectal Exam:  Normal external  rectum  Post-Void Residual (PVR) by Bladder Scan: In order to  evaluate bladder emptying, we discussed obtaining a postvoid residual and she agreed to this procedure.  Procedure: The ultrasound unit was placed on the patient's abdomen in the suprapubic region after the patient had voided. A PVR of 0 ml was obtained by bladder scan.  Laboratory Results: POC urine: + protein, otherwise negative   ASSESSMENT AND PLAN Ms. Flegal is a 81 y.o. with:  1. Prolapse of anterior vaginal wall   2. Uterovaginal prolapse, incomplete   3. Chronic idiopathic constipation     Stage II anterior, Stage I posterior, Stage I apical prolapse - For treatment of pelvic organ prolapse, we discussed options for management including expectant management, conservative management, and surgical management, such as Kegels, a pessary, pelvic floor physical therapy, and specific surgical procedures. - She is interested in doing some pelvic floor exercises at home. Handout provided.  - She is considering a pessary and will let us know if she wants to pursue this  2. Constipation - For constipation, we reviewed the importance of a better bowel regimen.  We also discussed the importance of avoiding chronic straining, as it can exacerbate her pelvic floor symptoms. We discussed initiating therapy with daily miralax in addition to her regimen of metamucil and prune juice  Return as needed   Jaquita Folds, MD

## 2022-05-04 NOTE — Patient Instructions (Signed)
Constipation: Our goal is to achieve formed bowel movements daily or every-other-day.  You may need to try different combinations of the following options to find what works best for you - everybody's body works differently so feel free to adjust the dosages as needed.  Some options to help maintain bowel health include:  Dietary changes (more leafy greens, vegetables and fruits; less processed foods) Fiber supplementation (Benefiber, FiberCon, Metamucil or Psyllium). Start slow and increase gradually to full dose. Over-the-counter agents such as: stool softeners (Docusate or Colace) and/or laxatives (Miralax, milk of magnesia) . Start taking miralax daily and then can decrease as needed.  "Power Pudding" is a natural mixture that may help your constipation.  To make blend 1 cup applesauce, 1 cup wheat bran, and 3/4 cup prune juice, refrigerate and then take 1 tablespoon daily with a large glass of water as needed.

## 2022-05-06 DIAGNOSIS — E1136 Type 2 diabetes mellitus with diabetic cataract: Secondary | ICD-10-CM | POA: Diagnosis not present

## 2022-05-06 DIAGNOSIS — R600 Localized edema: Secondary | ICD-10-CM | POA: Diagnosis not present

## 2022-05-11 DIAGNOSIS — M7061 Trochanteric bursitis, right hip: Secondary | ICD-10-CM | POA: Diagnosis not present

## 2022-05-11 DIAGNOSIS — I10 Essential (primary) hypertension: Secondary | ICD-10-CM | POA: Diagnosis not present

## 2022-05-11 DIAGNOSIS — Z96641 Presence of right artificial hip joint: Secondary | ICD-10-CM | POA: Diagnosis not present

## 2022-05-11 DIAGNOSIS — N1831 Chronic kidney disease, stage 3a: Secondary | ICD-10-CM | POA: Diagnosis not present

## 2022-05-11 DIAGNOSIS — E7800 Pure hypercholesterolemia, unspecified: Secondary | ICD-10-CM | POA: Diagnosis not present

## 2022-05-11 DIAGNOSIS — N183 Chronic kidney disease, stage 3 unspecified: Secondary | ICD-10-CM | POA: Diagnosis not present

## 2022-05-17 DIAGNOSIS — M6281 Muscle weakness (generalized): Secondary | ICD-10-CM | POA: Diagnosis not present

## 2022-05-17 DIAGNOSIS — Z96641 Presence of right artificial hip joint: Secondary | ICD-10-CM | POA: Diagnosis not present

## 2022-05-17 DIAGNOSIS — R262 Difficulty in walking, not elsewhere classified: Secondary | ICD-10-CM | POA: Diagnosis not present

## 2022-05-20 DIAGNOSIS — R195 Other fecal abnormalities: Secondary | ICD-10-CM | POA: Diagnosis not present

## 2022-05-21 DIAGNOSIS — Z96641 Presence of right artificial hip joint: Secondary | ICD-10-CM | POA: Diagnosis not present

## 2022-05-21 DIAGNOSIS — M6281 Muscle weakness (generalized): Secondary | ICD-10-CM | POA: Diagnosis not present

## 2022-05-21 DIAGNOSIS — R262 Difficulty in walking, not elsewhere classified: Secondary | ICD-10-CM | POA: Diagnosis not present

## 2022-05-31 DIAGNOSIS — M6281 Muscle weakness (generalized): Secondary | ICD-10-CM | POA: Diagnosis not present

## 2022-05-31 DIAGNOSIS — R262 Difficulty in walking, not elsewhere classified: Secondary | ICD-10-CM | POA: Diagnosis not present

## 2022-05-31 DIAGNOSIS — Z96641 Presence of right artificial hip joint: Secondary | ICD-10-CM | POA: Diagnosis not present

## 2022-06-04 DIAGNOSIS — R262 Difficulty in walking, not elsewhere classified: Secondary | ICD-10-CM | POA: Diagnosis not present

## 2022-06-04 DIAGNOSIS — M6281 Muscle weakness (generalized): Secondary | ICD-10-CM | POA: Diagnosis not present

## 2022-06-04 DIAGNOSIS — Z96641 Presence of right artificial hip joint: Secondary | ICD-10-CM | POA: Diagnosis not present

## 2022-06-14 DIAGNOSIS — Z96641 Presence of right artificial hip joint: Secondary | ICD-10-CM | POA: Diagnosis not present

## 2022-06-14 DIAGNOSIS — R262 Difficulty in walking, not elsewhere classified: Secondary | ICD-10-CM | POA: Diagnosis not present

## 2022-06-14 DIAGNOSIS — M6281 Muscle weakness (generalized): Secondary | ICD-10-CM | POA: Diagnosis not present

## 2022-06-17 DIAGNOSIS — R262 Difficulty in walking, not elsewhere classified: Secondary | ICD-10-CM | POA: Diagnosis not present

## 2022-06-17 DIAGNOSIS — Z96641 Presence of right artificial hip joint: Secondary | ICD-10-CM | POA: Diagnosis not present

## 2022-06-17 DIAGNOSIS — M6281 Muscle weakness (generalized): Secondary | ICD-10-CM | POA: Diagnosis not present

## 2022-06-21 DIAGNOSIS — Z96641 Presence of right artificial hip joint: Secondary | ICD-10-CM | POA: Diagnosis not present

## 2022-06-21 DIAGNOSIS — M6281 Muscle weakness (generalized): Secondary | ICD-10-CM | POA: Diagnosis not present

## 2022-06-21 DIAGNOSIS — R262 Difficulty in walking, not elsewhere classified: Secondary | ICD-10-CM | POA: Diagnosis not present

## 2022-06-28 DIAGNOSIS — Z96641 Presence of right artificial hip joint: Secondary | ICD-10-CM | POA: Diagnosis not present

## 2022-06-28 DIAGNOSIS — R262 Difficulty in walking, not elsewhere classified: Secondary | ICD-10-CM | POA: Diagnosis not present

## 2022-06-28 DIAGNOSIS — M6281 Muscle weakness (generalized): Secondary | ICD-10-CM | POA: Diagnosis not present

## 2022-07-02 DIAGNOSIS — Z96641 Presence of right artificial hip joint: Secondary | ICD-10-CM | POA: Diagnosis not present

## 2022-07-02 DIAGNOSIS — M6281 Muscle weakness (generalized): Secondary | ICD-10-CM | POA: Diagnosis not present

## 2022-07-02 DIAGNOSIS — R262 Difficulty in walking, not elsewhere classified: Secondary | ICD-10-CM | POA: Diagnosis not present

## 2022-07-05 DIAGNOSIS — G894 Chronic pain syndrome: Secondary | ICD-10-CM | POA: Diagnosis not present

## 2022-07-05 DIAGNOSIS — L84 Corns and callosities: Secondary | ICD-10-CM | POA: Diagnosis not present

## 2022-07-05 DIAGNOSIS — I10 Essential (primary) hypertension: Secondary | ICD-10-CM | POA: Diagnosis not present

## 2022-07-09 DIAGNOSIS — Z96641 Presence of right artificial hip joint: Secondary | ICD-10-CM | POA: Diagnosis not present

## 2022-07-09 DIAGNOSIS — M6281 Muscle weakness (generalized): Secondary | ICD-10-CM | POA: Diagnosis not present

## 2022-07-09 DIAGNOSIS — R262 Difficulty in walking, not elsewhere classified: Secondary | ICD-10-CM | POA: Diagnosis not present

## 2022-07-11 DIAGNOSIS — N3001 Acute cystitis with hematuria: Secondary | ICD-10-CM | POA: Diagnosis not present

## 2022-07-12 DIAGNOSIS — M6281 Muscle weakness (generalized): Secondary | ICD-10-CM | POA: Diagnosis not present

## 2022-07-12 DIAGNOSIS — R262 Difficulty in walking, not elsewhere classified: Secondary | ICD-10-CM | POA: Diagnosis not present

## 2022-07-12 DIAGNOSIS — Z96641 Presence of right artificial hip joint: Secondary | ICD-10-CM | POA: Diagnosis not present

## 2022-07-13 DIAGNOSIS — I10 Essential (primary) hypertension: Secondary | ICD-10-CM | POA: Diagnosis not present

## 2022-07-13 DIAGNOSIS — G894 Chronic pain syndrome: Secondary | ICD-10-CM | POA: Diagnosis not present

## 2022-07-13 DIAGNOSIS — N3001 Acute cystitis with hematuria: Secondary | ICD-10-CM | POA: Diagnosis not present

## 2022-07-15 DIAGNOSIS — M25561 Pain in right knee: Secondary | ICD-10-CM | POA: Diagnosis not present

## 2022-07-15 DIAGNOSIS — M7061 Trochanteric bursitis, right hip: Secondary | ICD-10-CM | POA: Diagnosis not present

## 2022-07-16 ENCOUNTER — Other Ambulatory Visit: Payer: Self-pay | Admitting: Orthopedic Surgery

## 2022-07-16 ENCOUNTER — Other Ambulatory Visit: Payer: Self-pay | Admitting: *Deleted

## 2022-07-16 DIAGNOSIS — M6281 Muscle weakness (generalized): Secondary | ICD-10-CM | POA: Diagnosis not present

## 2022-07-16 DIAGNOSIS — Z96641 Presence of right artificial hip joint: Secondary | ICD-10-CM | POA: Diagnosis not present

## 2022-07-16 DIAGNOSIS — M25551 Pain in right hip: Secondary | ICD-10-CM

## 2022-07-16 DIAGNOSIS — R262 Difficulty in walking, not elsewhere classified: Secondary | ICD-10-CM | POA: Diagnosis not present

## 2022-07-16 NOTE — Patient Outreach (Signed)
  Care Coordination   Initial Visit Note   07/16/2022 Name: Christina Sellers MRN: [id number] DOB: 1941-10-16  Christina Sellers is a 81 y.o. year old female who sees Josetta Huddle, MD for primary care. I spoke with  [name] by phone today  RN discussed services [facility] services, RN, SW, and Pharmacist. Patient declined services.    Goals Addressed   None     SDOH assessments and interventions completed:  No     Care Coordination Interventions Activated:  No  Care Coordination Interventions:  No, not indicated   Follow up plan: No further intervention required.   Encounter Outcome:  Pt. [facility] [phone]

## 2022-07-19 DIAGNOSIS — R262 Difficulty in walking, not elsewhere classified: Secondary | ICD-10-CM | POA: Diagnosis not present

## 2022-07-19 DIAGNOSIS — M6281 Muscle weakness (generalized): Secondary | ICD-10-CM | POA: Diagnosis not present

## 2022-07-19 DIAGNOSIS — Z96641 Presence of right artificial hip joint: Secondary | ICD-10-CM | POA: Diagnosis not present

## 2022-07-22 DIAGNOSIS — M25551 Pain in right hip: Secondary | ICD-10-CM | POA: Diagnosis not present

## 2022-07-22 DIAGNOSIS — R58 Hemorrhage, not elsewhere classified: Secondary | ICD-10-CM | POA: Diagnosis not present

## 2022-07-22 DIAGNOSIS — I10 Essential (primary) hypertension: Secondary | ICD-10-CM | POA: Diagnosis not present

## 2022-07-26 DIAGNOSIS — R262 Difficulty in walking, not elsewhere classified: Secondary | ICD-10-CM | POA: Diagnosis not present

## 2022-07-26 DIAGNOSIS — Z96641 Presence of right artificial hip joint: Secondary | ICD-10-CM | POA: Diagnosis not present

## 2022-07-26 DIAGNOSIS — M6281 Muscle weakness (generalized): Secondary | ICD-10-CM | POA: Diagnosis not present

## 2022-07-27 DIAGNOSIS — M25551 Pain in right hip: Secondary | ICD-10-CM | POA: Diagnosis not present

## 2022-07-29 ENCOUNTER — Other Ambulatory Visit: Payer: Medicare Other

## 2022-07-30 DIAGNOSIS — R262 Difficulty in walking, not elsewhere classified: Secondary | ICD-10-CM | POA: Diagnosis not present

## 2022-07-30 DIAGNOSIS — Z96641 Presence of right artificial hip joint: Secondary | ICD-10-CM | POA: Diagnosis not present

## 2022-07-30 DIAGNOSIS — M6281 Muscle weakness (generalized): Secondary | ICD-10-CM | POA: Diagnosis not present

## 2022-08-06 ENCOUNTER — Other Ambulatory Visit (HOSPITAL_BASED_OUTPATIENT_CLINIC_OR_DEPARTMENT_OTHER): Payer: Self-pay

## 2022-08-06 DIAGNOSIS — I10 Essential (primary) hypertension: Secondary | ICD-10-CM | POA: Diagnosis not present

## 2022-08-06 DIAGNOSIS — R262 Difficulty in walking, not elsewhere classified: Secondary | ICD-10-CM | POA: Diagnosis not present

## 2022-08-06 DIAGNOSIS — M6281 Muscle weakness (generalized): Secondary | ICD-10-CM | POA: Diagnosis not present

## 2022-08-06 DIAGNOSIS — Z96641 Presence of right artificial hip joint: Secondary | ICD-10-CM | POA: Diagnosis not present

## 2022-08-06 DIAGNOSIS — H918X3 Other specified hearing loss, bilateral: Secondary | ICD-10-CM | POA: Diagnosis not present

## 2022-08-06 DIAGNOSIS — R1030 Lower abdominal pain, unspecified: Secondary | ICD-10-CM | POA: Diagnosis not present

## 2022-08-06 DIAGNOSIS — R413 Other amnesia: Secondary | ICD-10-CM | POA: Diagnosis not present

## 2022-08-06 DIAGNOSIS — M79651 Pain in right thigh: Secondary | ICD-10-CM | POA: Diagnosis not present

## 2022-08-06 DIAGNOSIS — M810 Age-related osteoporosis without current pathological fracture: Secondary | ICD-10-CM | POA: Diagnosis not present

## 2022-08-06 MED ORDER — ZOSTER VAC RECOMB ADJUVANTED 50 MCG/0.5ML IM SUSR
INTRAMUSCULAR | 1 refills | Status: DC
  Filled 2022-08-06: qty 0.5, 1d supply, fill #0

## 2022-08-25 DIAGNOSIS — I10 Essential (primary) hypertension: Secondary | ICD-10-CM | POA: Diagnosis not present

## 2022-08-25 DIAGNOSIS — M79651 Pain in right thigh: Secondary | ICD-10-CM | POA: Diagnosis not present

## 2022-08-25 DIAGNOSIS — M545 Low back pain, unspecified: Secondary | ICD-10-CM | POA: Diagnosis not present

## 2022-08-25 DIAGNOSIS — L84 Corns and callosities: Secondary | ICD-10-CM | POA: Diagnosis not present

## 2022-08-26 DIAGNOSIS — I872 Venous insufficiency (chronic) (peripheral): Secondary | ICD-10-CM | POA: Diagnosis not present

## 2022-08-26 DIAGNOSIS — L308 Other specified dermatitis: Secondary | ICD-10-CM | POA: Diagnosis not present

## 2022-08-26 DIAGNOSIS — I8312 Varicose veins of left lower extremity with inflammation: Secondary | ICD-10-CM | POA: Diagnosis not present

## 2022-08-26 DIAGNOSIS — I8311 Varicose veins of right lower extremity with inflammation: Secondary | ICD-10-CM | POA: Diagnosis not present

## 2022-08-26 DIAGNOSIS — L570 Actinic keratosis: Secondary | ICD-10-CM | POA: Diagnosis not present

## 2022-09-02 DIAGNOSIS — M810 Age-related osteoporosis without current pathological fracture: Secondary | ICD-10-CM | POA: Diagnosis not present

## 2022-09-03 DIAGNOSIS — H02055 Trichiasis without entropian left lower eyelid: Secondary | ICD-10-CM | POA: Diagnosis not present

## 2022-09-03 DIAGNOSIS — H02059 Trichiasis without entropian unspecified eye, unspecified eyelid: Secondary | ICD-10-CM | POA: Diagnosis not present

## 2022-09-03 DIAGNOSIS — H04123 Dry eye syndrome of bilateral lacrimal glands: Secondary | ICD-10-CM | POA: Diagnosis not present

## 2022-09-03 DIAGNOSIS — H02052 Trichiasis without entropian right lower eyelid: Secondary | ICD-10-CM | POA: Diagnosis not present

## 2022-09-03 DIAGNOSIS — H02401 Unspecified ptosis of right eyelid: Secondary | ICD-10-CM | POA: Diagnosis not present

## 2022-09-08 ENCOUNTER — Encounter: Payer: Self-pay | Admitting: Nurse Practitioner

## 2022-09-08 ENCOUNTER — Ambulatory Visit (INDEPENDENT_AMBULATORY_CARE_PROVIDER_SITE_OTHER): Payer: Medicare Other | Admitting: Nurse Practitioner

## 2022-09-08 ENCOUNTER — Ambulatory Visit: Payer: Medicare Other | Admitting: Orthopaedic Surgery

## 2022-09-08 VITALS — BP 130/74 | HR 63 | Temp 97.3°F

## 2022-09-08 DIAGNOSIS — N9089 Other specified noninflammatory disorders of vulva and perineum: Secondary | ICD-10-CM | POA: Diagnosis not present

## 2022-09-08 DIAGNOSIS — R3915 Urgency of urination: Secondary | ICD-10-CM | POA: Diagnosis not present

## 2022-09-08 DIAGNOSIS — N3000 Acute cystitis without hematuria: Secondary | ICD-10-CM

## 2022-09-08 DIAGNOSIS — R350 Frequency of micturition: Secondary | ICD-10-CM

## 2022-09-08 DIAGNOSIS — L900 Lichen sclerosus et atrophicus: Secondary | ICD-10-CM | POA: Diagnosis not present

## 2022-09-08 LAB — WET PREP FOR TRICH, YEAST, CLUE

## 2022-09-08 MED ORDER — CEFDINIR 300 MG PO CAPS: 300.0000 mg | ORAL_CAPSULE | Freq: Two times a day (BID) | ORAL | 0 refills | Status: AC

## 2022-09-08 NOTE — Progress Notes (Signed)
   Acute Office Visit  Subjective:    Patient ID: Christina Sellers, female    DOB: 1941/08/03, 81 y.o.   MRN: [id number]   HPI 81 y.o. presents today for urinary frequency, urgency and pelvis pressure. Also reports vulvar irritation/burning. All symptoms began yesterday. H/O lichen sclerosus. Using A&D ointment only.    Review of Systems  Constitutional: Negative.   Genitourinary:  Positive for frequency, urgency and vaginal pain (Vulvar burning/irritation). Negative for difficulty urinating, hematuria, vaginal bleeding and vaginal discharge.       Objective:    Physical Exam Constitutional:      Appearance: Normal appearance.  Abdominal:     Tenderness: There is no right CVA tenderness or left CVA tenderness.  Genitourinary:    Comments: Redness and hypopigmentation consistent with LS, anterior labial fusion present    BP 130/74   Pulse 63   Temp (!) 97.3 F (36.3 C)   SpO2 93%  Wt Readings from Last 3 Encounters:  05/04/22 137 lb (62.1 kg)  04/10/22 130 lb (59 kg)  03/24/22 136 lb (61.7 kg)        Patient informed chaperone available to be present for breast and/or pelvic exam. Patient has requested no chaperone to be present. Patient has been advised what will be completed during breast and pelvic exam.   Wet prep negative UA 1+ leukocytes, negative nitrites, protein 2+, blood 1+. Microscopic: wbc 10-20, rbc 0-2, few bacteria  Assessment & Plan:   Problem List Items Addressed This Visit       Genitourinary   Acute cystitis without hematuria - Primary   Relevant Medications   cefdinir (OMNICEF) 300 MG capsule   Other Visit Diagnoses     Urinary frequency       Relevant Orders   Urinalysis,Complete w/RFL Culture (Completed)   Urine Culture   REFLEXIVE URINE CULTURE (Completed)   Vulvar irritation       Relevant Orders   WET PREP FOR TRICH, YEAST, CLUE   Lichen sclerosus          Plan: UA suggestive of UTI - Cefdinir 300 mg BID x 7 days. Patient  has many allergies. Culture pending. Recommend restarting clobetasol ointment to vulva. Will use daily x 1 week, then every other day x 1 week, then twice weekly.      Tamela Gammon DNP, 10:58 AM 09/08/2022

## 2022-09-09 ENCOUNTER — Telehealth: Payer: Self-pay

## 2022-09-09 DIAGNOSIS — L900 Lichen sclerosus et atrophicus: Secondary | ICD-10-CM

## 2022-09-09 MED ORDER — CLOBETASOL PROPIONATE 0.05 % EX OINT: TOPICAL_OINTMENT | CUTANEOUS | 2 refills | Status: DC

## 2022-09-09 NOTE — Telephone Encounter (Signed)
Spoke with patient and informed her Rx sent to [facility].

## 2022-09-09 NOTE — Telephone Encounter (Signed)
Patient called stating TW was going to send in Rx for cream but it was not at pharmacy. She did get the antibiotic.  She said she does not have any of the cream.  09/08/22 Office note "Plan: UA suggestive of UTI - Cefdinir 300 mg BID x 7 days. Patient has many allergies. Culture pending. Recommend restarting clobetasol ointment to vulva. Will use daily x 1 week, then every other day x 1 week, then twice weekly. "   Please sign Rx if ok.

## 2022-09-12 ENCOUNTER — Emergency Department (HOSPITAL_BASED_OUTPATIENT_CLINIC_OR_DEPARTMENT_OTHER)
Admission: EM | Admit: 2022-09-12 | Discharge: 2022-09-12 | Disposition: A | Discharge status: Home / Self Care | Payer: Medicare Other | Attending: Emergency Medicine | Admitting: Emergency Medicine

## 2022-09-12 ENCOUNTER — Emergency Department (HOSPITAL_BASED_OUTPATIENT_CLINIC_OR_DEPARTMENT_OTHER): Payer: Medicare Other

## 2022-09-12 ENCOUNTER — Emergency Department (HOSPITAL_BASED_OUTPATIENT_CLINIC_OR_DEPARTMENT_OTHER): Payer: Medicare Other | Admitting: Radiology

## 2022-09-12 ENCOUNTER — Other Ambulatory Visit: Payer: Self-pay

## 2022-09-12 DIAGNOSIS — J45909 Unspecified asthma, uncomplicated: Secondary | ICD-10-CM | POA: Insufficient documentation

## 2022-09-12 DIAGNOSIS — S0003XA Contusion of scalp, initial encounter: Secondary | ICD-10-CM | POA: Diagnosis not present

## 2022-09-12 DIAGNOSIS — Y92019 Unspecified place in single-family (private) house as the place of occurrence of the external cause: Secondary | ICD-10-CM | POA: Diagnosis not present

## 2022-09-12 DIAGNOSIS — S8012XA Contusion of left lower leg, initial encounter: Secondary | ICD-10-CM | POA: Diagnosis not present

## 2022-09-12 DIAGNOSIS — E119 Type 2 diabetes mellitus without complications: Secondary | ICD-10-CM | POA: Diagnosis not present

## 2022-09-12 DIAGNOSIS — J449 Chronic obstructive pulmonary disease, unspecified: Secondary | ICD-10-CM | POA: Insufficient documentation

## 2022-09-12 DIAGNOSIS — S0990XA Unspecified injury of head, initial encounter: Secondary | ICD-10-CM

## 2022-09-12 DIAGNOSIS — W109XXA Fall (on) (from) unspecified stairs and steps, initial encounter: Secondary | ICD-10-CM | POA: Insufficient documentation

## 2022-09-12 DIAGNOSIS — E039 Hypothyroidism, unspecified: Secondary | ICD-10-CM | POA: Diagnosis not present

## 2022-09-12 DIAGNOSIS — S59901A Unspecified injury of right elbow, initial encounter: Secondary | ICD-10-CM | POA: Diagnosis present

## 2022-09-12 DIAGNOSIS — M7989 Other specified soft tissue disorders: Secondary | ICD-10-CM | POA: Diagnosis not present

## 2022-09-12 DIAGNOSIS — Z043 Encounter for examination and observation following other accident: Secondary | ICD-10-CM | POA: Diagnosis not present

## 2022-09-12 DIAGNOSIS — S8011XA Contusion of right lower leg, initial encounter: Secondary | ICD-10-CM | POA: Diagnosis not present

## 2022-09-12 DIAGNOSIS — W19XXXA Unspecified fall, initial encounter: Secondary | ICD-10-CM

## 2022-09-12 DIAGNOSIS — S5001XA Contusion of right elbow, initial encounter: Secondary | ICD-10-CM | POA: Insufficient documentation

## 2022-09-12 DIAGNOSIS — M47812 Spondylosis without myelopathy or radiculopathy, cervical region: Secondary | ICD-10-CM | POA: Diagnosis not present

## 2022-09-12 DIAGNOSIS — I10 Essential (primary) hypertension: Secondary | ICD-10-CM | POA: Insufficient documentation

## 2022-09-12 DIAGNOSIS — R220 Localized swelling, mass and lump, head: Secondary | ICD-10-CM | POA: Diagnosis not present

## 2022-09-12 MED ORDER — ACETAMINOPHEN 325 MG PO TABS
650.0000 mg | ORAL_TABLET | Freq: Once | ORAL | Status: AC
  Administered 2022-09-12: 650 mg via ORAL
  Filled 2022-09-12: qty 2

## 2022-09-12 NOTE — Discharge Instructions (Signed)
You were seen in the emergency department today after a fall.  Your CT scans and x-rays did not show any broken bones or bleeding.  He may continue with Tylenol at home.  You may notice stiffness and soreness worsening over the next several days.  Return with any new or sudden worsening symptoms.

## 2022-09-12 NOTE — ED Provider Notes (Signed)
Emergency Department Provider Note   I have reviewed the triage vital signs and the nursing notes.   HISTORY  Chief Complaint Fall   HPI Kayron Kalmar is a 81 y.o. female with PMH reviewed presents to the ED POV after mechanical fall while visiting family. She was stepping up into the house when she fell backwards down two steps.  She did not feel lightheaded or experience chest pain/shortness of breath prior to falling.  She states the top step was a bit taller than the other one and caused her to lose her balance.  She landed flat on her back and hit the back of her head.  She is feeling some swelling to the posterior/right side of her head.  She is also having some pain in her right elbow and right lower leg.  No numbness or weakness in the arms or legs.    Past Medical History:  Diagnosis Date   Abnormal Pap smear of vagina    Allergic sinusitis    Anxiety    Atherosclerotic peripheral vascular disease (HCC)    Carotid artery stenosis    Cellulitis    COPD with asthma    not an issue now   Depression    Diabetes mellitus type 2, uncomplicated (HCC)    DJD (degenerative joint disease)    Elevated cholesterol    Elevated MCV    secondary to alchol   GERD (gastroesophageal reflux disease)    not an issue now.   Gout    Hearing difficulty    bilateral hearing aids   History of recurrent UTIs    Hypertension    loss weight'off meds now"   Hypothyroidism    IBS (irritable bowel syndrome)    Insomnia    Labial fusion    Osteoarthritis of left knee    Osteopenia    Recurrent vaginitis    Sjogren's syndrome ([place])    Syncope 11.7.14   secondary to dehydration and possible hypoglycemia   Tendonitis    Thyroid disease    Transfusion history    age 54 "anemia"    Review of Systems  Constitutional: No fever/chills Cardiovascular: Denies chest pain. Respiratory: Denies shortness of breath. Gastrointestinal: No abdominal pain.   Genitourinary: Negative for  dysuria. Musculoskeletal: Positive for chronic back pain not worse since fall. Positive right elbow pain.  Skin: Negative for rash. Neurological: Negative for focal weakness or numbness. Positive HA.    ____________________________________________   PHYSICAL EXAM:  VITAL SIGNS: ED Triage Vitals  Enc Vitals Group     BP 09/12/22 1718 (!) 203/62     Pulse Rate 09/12/22 1718 78     Resp 09/12/22 1718 (!) 22     Temp 09/12/22 1718 98.2 F (36.8 C)     Temp src --      SpO2 09/12/22 1718 96 %     Weight 09/12/22 1718 138 lb (62.6 kg)     Height 09/12/22 1718 '4\' 9"'$  (1.448 m)   Constitutional: Alert and oriented. Well appearing and in no acute distress. Eyes: Conjunctivae are normal.  Head: No laceration. Approximately 4 x 4 centimeter hematoma to the right occipital scalp.  Nose: No congestion/rhinnorhea. Mouth/Throat: Mucous membranes are moist.  Neck: No stridor. No cervical spine tenderness to palpation. Cardiovascular: Normal rate, regular rhythm. Good peripheral circulation. Grossly normal heart sounds.   Respiratory: Normal respiratory effort.  No retractions. Lungs CTAB. Gastrointestinal: Soft and nontender. No distention.  Musculoskeletal: No lower extremity tenderness nor edema.  No gross deformities of extremities.  Contusion to the right elbow overlying the olecranon without laceration.  Preserved range of motion of the right shoulder, elbow, wrist.  No scaphoid tenderness bilaterally.  Normal range of motion of the bilateral hips, knees, ankles without bony tenderness. Neurologic:  Normal speech and language. No gross focal neurologic deficits are appreciated.  Skin:  Skin is warm, dry and intact.  Bruising noted to the right elbow as above.  Patient also with spots of bruising to the bilateral lower extremities which she states she is noticed prior to falling.   ____________________________________________  RADIOLOGY  No results  found.  ____________________________________________   PROCEDURES  Procedure(s) performed:   Procedures   ____________________________________________   INITIAL IMPRESSION / ASSESSMENT AND PLAN / ED COURSE  Pertinent labs & imaging results that were available during my care of the patient were reviewed by me and considered in my medical decision making (see chart for details).   This patient is Presenting for Evaluation of head injury, which does require a range of treatment options, and is a complaint that involves a high risk of morbidity and mortality.  The Differential Diagnoses includes subdural hematoma, epidural hematoma, acute concussion, traumatic subarachnoid hemorrhage, cerebral contusions, etc.   Critical Interventions-    Medications  acetaminophen (TYLENOL) tablet 650 mg (650 mg Oral Given 09/12/22 1752)    Reassessment after intervention:     I did obtain Additional Historical Information from son at bedside.  Radiologic Tests Ordered, included ***. I independently interpreted the images and agree with radiology interpretation.   Medical Decision Making: Summary:  Patient presents emergency department evaluation after mechanical fall down 2 steps.  She has a scalp hematoma without laceration.  Some bruising and pain to the right elbow.  Plan for CT imaging of the head, C-spine, along with plain film.   Reevaluation with update and discussion with   ***Considered admission***  Disposition:   ____________________________________________  FINAL CLINICAL IMPRESSION(S) / ED DIAGNOSES  Final diagnoses:  None     NEW OUTPATIENT MEDICATIONS STARTED DURING THIS VISIT:  New Prescriptions   No medications on file    Note:  This document was prepared using Dragon voice recognition software and may include unintentional dictation errors.  Nanda Quinton, MD, [facility]

## 2022-09-12 NOTE — ED Triage Notes (Signed)
Patient arrives with complaints of hitting her due to a mechanical fall earlier today.   Patient does not take blood thinners and she not lose consciousness.   Accompanied by family

## 2022-09-12 NOTE — ED Notes (Signed)
Patient transported to X-ray 

## 2022-09-13 ENCOUNTER — Telehealth: Payer: Self-pay | Admitting: *Deleted

## 2022-09-13 DIAGNOSIS — M79672 Pain in left foot: Secondary | ICD-10-CM | POA: Diagnosis not present

## 2022-09-13 NOTE — Telephone Encounter (Signed)
Patient informed. 

## 2022-09-13 NOTE — Telephone Encounter (Signed)
Patient called was treated with cefdinir 300 mg capsule on 09/08/22. Patient reports medication is causing vein bleed, reports she knows it a vein bleed because another doctor put her on hydroxyzine and it did the same. Reports its noticeable on both legs, she asked if another medication can be sent? Please advise

## 2022-09-13 NOTE — Telephone Encounter (Signed)
Like a ruptured vein? Have not heard of antibiotics causing this. She should be on day 5 of antibiotics and that specific one can be given for 5 days or 7 days. So if she is concerned about it I would just stop taking.

## 2022-09-17 ENCOUNTER — Telehealth: Payer: Self-pay

## 2022-09-17 DIAGNOSIS — R609 Edema, unspecified: Secondary | ICD-10-CM | POA: Diagnosis not present

## 2022-09-17 DIAGNOSIS — I5189 Other ill-defined heart diseases: Secondary | ICD-10-CM | POA: Diagnosis not present

## 2022-09-17 NOTE — Telephone Encounter (Signed)
        Patient  visited [place] on 10/8    Telephone encounter attempt :  1st  A HIPAA compliant voice message was left requesting a return call.  Instructed patient to call back   [place], [place] [facility]  [phone] [address], [place], [place] [address] Phone: [phone] Email: [email or web address]

## 2022-09-23 ENCOUNTER — Other Ambulatory Visit: Payer: Self-pay | Admitting: Orthopedic Surgery

## 2022-09-23 DIAGNOSIS — M545 Low back pain, unspecified: Secondary | ICD-10-CM

## 2022-09-24 ENCOUNTER — Encounter: Payer: Self-pay | Admitting: Internal Medicine

## 2022-09-24 ENCOUNTER — Ambulatory Visit: Payer: Medicare Other | Attending: Internal Medicine | Admitting: Internal Medicine

## 2022-09-24 VITALS — BP 164/68 | HR 69 | Ht <= 58 in | Wt 137.2 lb

## 2022-09-24 DIAGNOSIS — I152 Hypertension secondary to endocrine disorders: Secondary | ICD-10-CM | POA: Diagnosis not present

## 2022-09-24 DIAGNOSIS — E1159 Type 2 diabetes mellitus with other circulatory complications: Secondary | ICD-10-CM

## 2022-09-24 DIAGNOSIS — R002 Palpitations: Secondary | ICD-10-CM | POA: Diagnosis not present

## 2022-09-24 DIAGNOSIS — I700 Atherosclerosis of aorta: Secondary | ICD-10-CM

## 2022-09-24 DIAGNOSIS — I5032 Chronic diastolic (congestive) heart failure: Secondary | ICD-10-CM

## 2022-09-24 NOTE — Progress Notes (Signed)
Cardiology Office Note:    Date:  09/24/2022   ID:  Christina Sellers, DOB July 28, 1941, MRN [id number]  PCP:  Josetta Huddle, MD  Provider Notes:  Hearing Impaired   [place]  Cardiologist:  Werner Lean, MD  Advanced Practice Provider:  No care team member to display Electrophysiologist:  None      CC: Follow up PAD  History of Present Illness:    Christina Sellers is a 81 y.o. female with a hx of PAD (CAS), hx of COPD with Asthma, HLD with T2DM with aortic atherosclerosis, HTN with DM who presents for evaluation 01/21/21. 2022: Had syncope issues including syncope during an office visit.  Seen in the hospital during that admission and her diuretics and BB were stopped due to intolerance. Had right hip surgery 03/30/21.  Has done better of CCB and thiazide 2023: TR improved.  BP high.  Had issues with hydralazine and increased norvasc with Dr. Inda Merlin Dr. Inda Merlin retired.  Now sees Dr. Louis Matte  Patient notes that she is doing OK.   Feels very limited on a lot of things and is not having a good time. Had hip surgery and did well.  Now is having return to her hip pain.  Worse with standing Getting back MRI.  Not walking very well.  Worsening palpitations worse tonight- very occasionally.  Worsening LE swelling. Has not been wearing compression stockings because of leg blister. Has used lasix 20 mg three times a week.    Past Medical History:  Diagnosis Date   Abnormal Pap smear of vagina    Allergic sinusitis    Anxiety    Atherosclerotic peripheral vascular disease (HCC)    Carotid artery stenosis    Cellulitis    COPD with asthma    not an issue now   Depression    Diabetes mellitus type 2, uncomplicated (HCC)    DJD (degenerative joint disease)    Elevated cholesterol    Elevated MCV    secondary to alchol   GERD (gastroesophageal reflux disease)    not an issue now.   Gout    Hearing difficulty    bilateral hearing aids   History of  recurrent UTIs    Hypertension    loss weight'off meds now"   Hypothyroidism    IBS (irritable bowel syndrome)    Insomnia    Labial fusion    Osteoarthritis of left knee    Osteopenia    Recurrent vaginitis    Sjogren's syndrome ([place])    Syncope 11.7.14   secondary to dehydration and possible hypoglycemia   Tendonitis    Thyroid disease    Transfusion history    age 6 "anemia"    Past Surgical History:  Procedure Laterality Date   CATARACT EXTRACTION, BILATERAL Bilateral    CHOLECYSTECTOMY     open   COLONOSCOPY WITH PROPOFOL N/A 03/23/2016   Procedure: COLONOSCOPY WITH PROPOFOL;  Surgeon: Garlan Fair, MD;  Location: [facility];  Service: Endoscopy;  Laterality: N/A;   EYE SURGERY     FOOT SURGERY Left    retained hardware   KNEE SURGERY Left    scope    TONSILLECTOMY     TOTAL HIP ARTHROPLASTY Right 03/30/2021   Procedure: RIGHTTOTAL HIP ARTHROPLASTY ANTERIOR APPROACH;  Surgeon: Frederik Pear, MD;  Location: [facility];  Service: Orthopedics;  Laterality: Right;    Current Medications: Current Meds  Medication Sig   acetaminophen (TYLENOL) 650 MG CR tablet 2 tablets  in the morning and at bedtime.   amLODipine (NORVASC) 2.5 MG tablet Take 5 mg by mouth daily.   ascorbic acid (VITAMIN C) 500 MG tablet Take 500 mg by mouth daily.   CALCIUM 600 1500 (600 Ca) MG TABS tablet daily.   Cholecalciferol (VITAMIN D3) 50 MCG (2000 UT) capsule Take 2,000 mg by mouth daily.   clobetasol ointment (TEMOVATE) 0.05 % Apply to vulva daily x 1 week, then every other day x 1 week, then twice weekly.   cloNIDine (CATAPRES) 0.1 MG tablet Take 0.2 mg by mouth at bedtime.   Cyanocobalamin (VITAMIN B12) 1000 MCG TBCR daily.   denosumab (PROLIA) 60 MG/ML SOSY injection Inject 60 mg into the skin every 6 (six) months.   diclofenac sodium (VOLTAREN) 1 % GEL Apply 1 application topically 2 (two) times daily as needed (to painful sites).   donepezil (ARICEPT) 5 MG tablet Take 5 mg by mouth at  bedtime.   finasteride (PROSCAR) 5 MG tablet Take 2.5 mg by mouth daily.   fluorouracil (EFUDEX) 5 % cream SMARTSIG:sparingly Topical As Directed   fluticasone (FLONASE) 50 MCG/ACT nasal spray Place 1 spray into both nostrils daily as needed (for seasonal allergies).   furosemide (LASIX) 20 MG tablet Take 1 tablet (20 mg total) by mouth as needed for edema.   hydrocortisone 2.5 % cream Apply topically as needed.   hypromellose (SYSTANE OVERNIGHT THERAPY) 0.3 % GEL ophthalmic ointment Place 1 application into both eyes at bedtime as needed for dry eyes.   levothyroxine (SYNTHROID) 75 MCG tablet Take 75 mcg by mouth every morning.   meloxicam (MOBIC) 7.5 MG tablet Take by mouth daily as needed.   Multiple Vitamins-Minerals (MULTIVITAMIN WITH MINERALS) tablet Take 1 tablet by mouth daily.   nystatin cream (MYCOSTATIN) Apply 1 application topically 2 (two) times daily.   OVER THE COUNTER MEDICATION Take 2 capsules by mouth daily. Hydro Eye   Polyvinyl Alcohol-Povidone PF (REFRESH) 1.4-0.6 % SOLN as needed.   potassium chloride SA (KLOR-CON) 20 MEQ tablet Take 1 tablet (20 mEq total) by mouth daily.   Psyllium (METAMUCIL PO) Take 1 Scoop by mouth at bedtime as needed (constipation- mix and drink as directed).   RESTASIS 0.05 % ophthalmic emulsion Place 1 drop into both eyes 2 (two) times daily.   rosuvastatin (CRESTOR) 5 MG tablet Take 5 mg by mouth daily.   triamcinolone cream (KENALOG) 0.1 % SMARTSIG:sparingly Topical Twice Daily   vitamin E 200 UNIT capsule Take 200 Units by mouth daily.   Zoster Vaccine Adjuvanted [facility]) injection Inject into the muscle.     Allergies:   Tape, Celexa [citalopram hydrobromide], Fluconazole, Green tea leaf ext, Kenalog [triamcinolone], Levaquin [levofloxacin], Losartan potassium-hctz, Magnesium citrate, Methocarbamol, Nitrofurantoin macrocrystal, Scopolamine, Sulfa antibiotics, Tea, and Trimethoprim   Social History   Socioeconomic History   Marital  status: Widowed    Spouse name: Not on file   Number of children: Not on file   Years of education: Not on file   Highest education level: Not on file  Occupational History   Not on file  Tobacco Use   Smoking status: Former    Types: Cigarettes    Quit date: 02/21/1982    Years since quitting: 40.6   Smokeless tobacco: Never  Vaping Use   Vaping Use: Never used  Substance and Sexual Activity   Alcohol use: No   Drug use: No   Sexual activity: Never    Birth control/protection: Post-menopausal  Other Topics Concern  Not on file  Social History Narrative   Not on file   Social Determinants of Health   Financial Resource Strain: Not on file  Food Insecurity: Not on file  Transportation Needs: Not on file  Physical Activity: Not on file  Stress: Not on file  Social Connections: Not on file    Social:  Daughter is in [place] and had two kids' she has multiple family members one who graduated Facilities manager school (grandson in [place]); can't come see her grandkids because of her hip pain.  Family History: The patient's family history includes Breast cancer in her sister; Cancer in her brother and father; Diabetes in her maternal aunt.  ROS:   Please see the history of present illness.   All other systems reviewed and are negative.  EKGs/Labs/Other Studies Reviewed:    The following studies were reviewed today:  EKG:   03/24/22: SR 66 01/21/21: SR 65 WNL  ECHO COMPLETE WO IMAGING ENHANCING AGENT 04/08/2022  Narrative ECHOCARDIOGRAM REPORT    Patient Name:   IOMA CHISMAR Date of Exam: 04/08/2022 Medical Rec #:  [id number]         Height:       58.0 in Accession #:    [id number]        Weight:       136.0 lb Date of Birth:  1941-03-24         BSA:          1.546 m Patient Age:    45 years          BP:           185/70 mmHg Patient Gender: F                 HR:           76 bpm. Exam Location:  [facility]  Procedure: 2D Echo, 3D Echo, Color Doppler, Cardiac  Doppler and Strain Analysis  Indications:    R06.09 Dyspnea on exertion  History:        Patient has prior history of Echocardiogram examinations, most recent 02/02/2021. PVD and COPD, Signs/Symptoms:Orthostatic hypotension, Syncope and Shortness of Breath; Risk Factors:Hypertension, Diabetes, Sleep Apnea and Former Smoker.  Sonographer:    Basilia Jumbo [facility], RDCS Referring Phys: [id number] [facility]  IMPRESSIONS   1. Left ventricular ejection fraction, by estimation, is 60 to 65%. The left ventricle has normal function. The left ventricle has no regional wall motion abnormalities. Left ventricular diastolic parameters are consistent with Grade I diastolic dysfunction (impaired relaxation). The average left ventricular global longitudinal strain is -24.5 %. The global longitudinal strain is normal. 2. Right ventricular systolic function is normal. The right ventricular size is normal. There is normal pulmonary artery systolic pressure. The estimated right ventricular systolic pressure is 96.7 mmHg. 3. Left atrial size was moderately dilated. 4. The mitral valve is normal in structure. No evidence of mitral valve regurgitation. No evidence of mitral stenosis. 5. The aortic valve is tricuspid. Aortic valve regurgitation is not visualized. Aortic valve sclerosis/calcification is present, without any evidence of aortic stenosis. 6. The inferior vena cava is normal in size with greater than 50% respiratory variability, suggesting right atrial pressure of 3 mmHg.  Comparison(s): 02/02/21 EF 65-70. Mild-moderate TR.  FINDINGS Left Ventricle: Left ventricular ejection fraction, by estimation, is 60 to 65%. The left ventricle has normal function. The left ventricle has no regional wall motion abnormalities. The average left ventricular global longitudinal strain is -24.5 %. The  global longitudinal strain is normal. 3D left ventricular ejection fraction analysis performed but not reported based  on interpreter judgement due to suboptimal tracking. The left ventricular internal cavity size was normal in size. There is no left ventricular hypertrophy. Left ventricular diastolic parameters are consistent with Grade I diastolic dysfunction (impaired relaxation). Normal left ventricular filling pressure.  Right Ventricle: The right ventricular size is normal. No increase in right ventricular wall thickness. Right ventricular systolic function is normal. There is normal pulmonary artery systolic pressure. The tricuspid regurgitant velocity is 2.79 m/s, and with an assumed right atrial pressure of 3 mmHg, the estimated right ventricular systolic pressure is 00.8 mmHg.  Left Atrium: Left atrial size was moderately dilated.  Right Atrium: Right atrial size was normal in size.  Pericardium: There is no evidence of pericardial effusion.  Mitral Valve: The mitral valve is normal in structure. Mild to moderate mitral annular calcification. No evidence of mitral valve regurgitation. No evidence of mitral valve stenosis.  Tricuspid Valve: The tricuspid valve is normal in structure. Tricuspid valve regurgitation is trivial. No evidence of tricuspid stenosis.  Aortic Valve: The aortic valve is tricuspid. Aortic valve regurgitation is not visualized. Aortic valve sclerosis/calcification is present, without any evidence of aortic stenosis.  Pulmonic Valve: The pulmonic valve was normal in structure. Pulmonic valve regurgitation is not visualized. No evidence of pulmonic stenosis.  Aorta: The aortic root is normal in size and structure.  Venous: The inferior vena cava is normal in size with greater than 50% respiratory variability, suggesting right atrial pressure of 3 mmHg.  IAS/Shunts: No atrial level shunt detected by color flow Doppler.   LEFT VENTRICLE PLAX 2D LVIDd:         4.40 cm   Diastology LVIDs:         2.90 cm   LV e' medial:    6.96 cm/s LV PW:         0.90 cm   LV E/e' medial:   9.7 LV IVS:        1.00 cm   LV e' lateral:   6.96 cm/s LVOT diam:     2.00 cm   LV E/e' lateral: 9.7 LV SV:         80 LV SV Index:   52        2D Longitudinal Strain LVOT Area:     3.14 cm  2D Strain GLS (A2C):   -23.4 % 2D Strain GLS (A3C):   -26.7 % 2D Strain GLS (A4C):   -23.4 % 2D Strain GLS Avg:     -24.5 %  3D Volume EF: 3D EF:        58 % LV EDV:       100 ml LV ESV:       42 ml LV SV:        58 ml  RIGHT VENTRICLE             IVC RV Basal diam:  3.50 cm     IVC diam: 1.80 cm RV S prime:     13.40 cm/s TAPSE (M-mode): 2.7 cm RVSP:           34.1 mmHg  LEFT ATRIUM             Index        RIGHT ATRIUM           Index LA diam:        4.00 cm 2.59 cm/m   RA Pressure:  3.00 mmHg LA Vol (A2C):   70.2 ml 45.41 ml/m  RA Area:     14.10 cm LA Vol (A4C):   57.0 ml 36.87 ml/m  RA Volume:   38.60 ml  24.97 ml/m LA Biplane Vol: 63.8 ml 41.27 ml/m AORTIC VALVE LVOT Vmax:   116.00 cm/s LVOT Vmean:  79.100 cm/s LVOT VTI:    0.256 m  AORTA Ao Root diam: 2.70 cm Ao Asc diam:  2.50 cm  MITRAL VALVE               TRICUSPID VALVE TR Peak grad:   31.1 mmHg MV Decel Time: 197 msec    TR Vmax:        279.00 cm/s MV E velocity: 67.60 cm/s  Estimated RAP:  3.00 mmHg MV A velocity: 96.90 cm/s  RVSP:           34.1 mmHg MV E/A ratio:  0.70 SHUNTS Systemic VTI:  0.26 m Systemic Diam: 2.00 cm   NM Stress Testing : Date: 04/14/2018 Results: Nuclear stress EF: 77%. The left ventricular ejection fraction is hyperdynamic (>65%). There was no ST segment deviation noted during stress. The study is normal. This is a low risk study.   Normal pharmacologic nuclear stress test with no evidence for prior infarct or ischemia.  Hyperdynamic LVEF.  Carotid Duplex: Date:02/14/21 Results: Summary:  Right Carotid: Velocities in the right ICA are consistent with a 40-59%                 stenosis.   Left Carotid: Velocities in the left ICA are consistent with a 1-39%  stenosis.    Vertebrals:  Right vertebral artery demonstrates antegrade flow. Left  vertebral               artery demonstrates bidirectional flow.  Subclavians: Normal flow hemodynamics were seen in bilateral subclavian               arteries.   LE Dupelx Venous: Date: 02/15/21 Results: Summary:  BILATERAL:  - No evidence of deep vein thrombosis seen in the lower extremities,  bilaterally.  -No evidence of popliteal cyst, bilaterally.  RIGHT:  - No cystic structure found in the popliteal fossa.     LEFT:  - No cystic structure found in the popliteal fossa.    Recent Labs: 04/10/2022: B Natriuretic Peptide 249.4; BUN 39; Creatinine, Ser 1.00; Hemoglobin 11.5; Platelets 174; Potassium 4.4; Sodium 138  Recent Lipid Panel    Component Value Date/Time   CHOL 149 08/26/2011 0400   TRIG 195 (H) 08/26/2011 0400   HDL 37 (L) 08/26/2011 0400   CHOLHDL 4.0 08/26/2011 0400   VLDL 39 08/26/2011 0400   LDLCALC 73 08/26/2011 0400    Physical Exam:    VS:  BP (!) 164/68   Pulse 69   Ht '4\' 10"'$  (1.473 m)   Wt 137 lb 3.2 oz (62.2 kg)   SpO2 96%   BMI 28.67 kg/m     Wt Readings from Last 3 Encounters:  09/24/22 137 lb 3.2 oz (62.2 kg)  09/12/22 138 lb (62.6 kg)  05/04/22 137 lb (62.1 kg)    GEN:  Well nourished, well developed in mild distress HEENT: Normal LYMPHATICS: No lymphadenopathy  CARDIAC: RRR no systolic murmur RESPIRATORY:  Clear to auscultation without rales, wheezing or rhonchi  ABDOMEN: Soft, non-tender, non-distended MUSCULOSKELETAL:  +1 edema bilateral NEUROLOGIC:  Alert and oriented X3 PSYCHIATRIC:  Normal affect   ASSESSMENT:    1. Chronic  heart failure with preserved ejection fraction ([place])   2. Hypertension associated with diabetes ([place])   3. Palpitations   4. Aortic atherosclerosis (HCC)     PLAN:    Diastolic Dysfunction Diabetes with Hypertension With Orthostatic Hypotension Leg swelling with prior knee replacements Multi-factorial  - in the past had  near-syncope with HCTZ and CCB (high dose); had leg swelling with hydralazine - agree with the lasix, I suspect she will need more; we have talked a length about her options for medications; we will get BMP and BNP today and will likely add MRA and lasix daily to her regimen - at next visit will get SGLT2i  Palpitations Has having palpitations during check out; we will get EKG  HLD with DM Mild CAS on aspirin (last study in 2022- plan for 2024) Aortic atherosclerosis -LDL goal less than 70 at goal no change in therapy  Six months with APP One year with me  Time Spent Directly with Patient:   I have spent a total of 40 minutes with the patient reviewing notes, imaging, EKGs, labs and examining the patient as well as establishing an assessment and plan that was discussed personally with the patient.  > 50% of time was spent in direct patient care.     Medication Adjustments/Labs and Tests Ordered: Current medicines are reviewed at length with the patient today.  Concerns regarding medicines are outlined above.  Orders Placed This Encounter  Procedures   Basic metabolic panel   Pro b natriuretic peptide (BNP)    No orders of the defined types were placed in this encounter.    Patient Instructions  Medication Instructions:  Your physician recommends that you continue on your current medications as directed. Please refer to the Current Medication list given to you today.  *If you need a refill on your cardiac medications before your next appointment, please call your pharmacy*   Lab Work: TODAY: BMP, BNP  If you have labs (blood work) drawn today and your tests are completely normal, you will receive your results only by: [place] (if you have MyChart) OR A paper copy in the mail If you have any lab test that is abnormal or we need to change your treatment, we will call you to review the results.   Testing/Procedures: NONE   Follow-Up: At [facility],  you and your health needs are our priority.  As part of our continuing mission to provide you with exceptional heart care, we have created designated Provider Care Teams.  These Care Teams include your primary Cardiologist (physician) and Advanced Practice Providers (APPs -  Physician Assistants and Nurse Practitioners) who all work together to provide you with the care you need, when you need it.   Your next appointment:   3 -4 month(s)  The format for your next appointment:   In Person  Provider:   Werner Lean, MD      Important Information About Sugar         Signed, Werner Lean, MD  09/24/2022 11:33 AM    [place]

## 2022-09-24 NOTE — Patient Instructions (Signed)
Medication Instructions:  Your physician recommends that you continue on your current medications as directed. Please refer to the Current Medication list given to you today.  *If you need a refill on your cardiac medications before your next appointment, please call your pharmacy*   Lab Work: TODAY: BMP, BNP  If you have labs (blood work) drawn today and your tests are completely normal, you will receive your results only by: [place] (if you have MyChart) OR A paper copy in the mail If you have any lab test that is abnormal or we need to change your treatment, we will call you to review the results.   Testing/Procedures: NONE   Follow-Up: At [facility], you and your health needs are our priority.  As part of our continuing mission to provide you with exceptional heart care, we have created designated Provider Care Teams.  These Care Teams include your primary Cardiologist (physician) and Advanced Practice Providers (APPs -  Physician Assistants and Nurse Practitioners) who all work together to provide you with the care you need, when you need it.   Your next appointment:   3 -4 month(s)  The format for your next appointment:   In Person  Provider:   Werner Lean, MD      Important Information About Sugar

## 2022-09-25 LAB — BASIC METABOLIC PANEL
BUN/Creatinine Ratio: 26 (ref 12–28)
BUN: 25 mg/dL (ref 8–27)
CO2: 21 mmol/L (ref 20–29)
Calcium: 10.1 mg/dL (ref 8.7–10.3)
Chloride: 103 mmol/L (ref 96–106)
Creatinine, Ser: 0.98 mg/dL (ref 0.57–1.00)
Glucose: 115 mg/dL — ABNORMAL HIGH (ref 70–99)
Potassium: 3.9 mmol/L (ref 3.5–5.2)
Sodium: 141 mmol/L (ref 134–144)
eGFR: 57 mL/min/{1.73_m2} — ABNORMAL LOW (ref >59)

## 2022-09-25 LAB — PRO B NATRIURETIC PEPTIDE: NT-Pro BNP: 749 pg/mL — ABNORMAL HIGH (ref 0–738)

## 2022-09-27 NOTE — Addendum Note (Signed)
Addended by: Carylon Perches on: 09/27/2022 07:44 AM   Modules accepted: Orders

## 2022-09-28 ENCOUNTER — Telehealth: Payer: Self-pay

## 2022-09-28 DIAGNOSIS — I5032 Chronic diastolic (congestive) heart failure: Secondary | ICD-10-CM

## 2022-09-28 MED ORDER — FUROSEMIDE 20 MG PO TABS: 20.0000 mg | ORAL_TABLET | Freq: Every day | ORAL | 3 refills | Status: DC

## 2022-09-28 MED ORDER — SPIRONOLACTONE 25 MG PO TABS: 12.5000 mg | ORAL_TABLET | Freq: Every day | ORAL | 3 refills | Status: DC

## 2022-09-28 NOTE — Telephone Encounter (Signed)
The patient has been notified of the result and verbalized understanding.  All questions (if any) were answered. Bedelia Person Eyad Rochford, RN 09/28/2022 11:55 AM   Pt will come in for F/U labs on 10/12/22.

## 2022-09-28 NOTE — Telephone Encounter (Signed)
-----   Message from Werner Lean, MD sent at 09/26/2022  2:21 PM EDT ----- BNP elevated Lasix 20 mg Po daily and spironolactone 12.5 mg PO daily BMP in 1-2 weeks; may decrease her 20 meq K based on results

## 2022-09-29 DIAGNOSIS — H04123 Dry eye syndrome of bilateral lacrimal glands: Secondary | ICD-10-CM | POA: Diagnosis not present

## 2022-09-30 ENCOUNTER — Telehealth: Payer: Self-pay | Admitting: Internal Medicine

## 2022-09-30 LAB — URINALYSIS, COMPLETE W/RFL CULTURE
Bilirubin Urine: NEGATIVE
Casts: NONE SEEN /LPF
Crystals: NONE SEEN /HPF
Glucose, UA: NEGATIVE
Hyaline Cast: NONE SEEN /LPF
Ketones, ur: NEGATIVE
Nitrites, Initial: NEGATIVE
Specific Gravity, Urine: 1.008 (ref 1.001–1.035)
Yeast: NONE SEEN /HPF
pH: 7 (ref 5.0–8.0)

## 2022-09-30 LAB — URINE CULTURE
MICRO NUMBER:: 14006587
SPECIMEN QUALITY:: ADEQUATE

## 2022-09-30 LAB — CULTURE INDICATED

## 2022-09-30 NOTE — Telephone Encounter (Signed)
Pt c/o medication issue:  1. Name of Medication:   spironolactone (ALDACTONE) 25 MG tablet  2. How are you currently taking this medication (dosage and times per day)?   Patient has not taken any of this medication as yet  3. Are you having a reaction (difficulty breathing--STAT)?   No  4. What is your medication issue?   Patient stated the prescription says "can't split".  Patient would like clarification on the correct dosage for this medication.

## 2022-09-30 NOTE — Telephone Encounter (Signed)
Called pt in regards to spironolactone. Pt concerned that pamphlet that comes with medication says can't be split. Advised pt medication has been prescribed this way quite often and never had an issue. Pt used pill slicer while on phone expressed did not cut evenly and will not take medication.  Advised pt I will send message to pharmacy and MD to review.

## 2022-10-01 DIAGNOSIS — Z79899 Other long term (current) drug therapy: Secondary | ICD-10-CM | POA: Diagnosis not present

## 2022-10-01 DIAGNOSIS — I5189 Other ill-defined heart diseases: Secondary | ICD-10-CM | POA: Diagnosis not present

## 2022-10-01 DIAGNOSIS — R609 Edema, unspecified: Secondary | ICD-10-CM | POA: Diagnosis not present

## 2022-10-01 NOTE — Telephone Encounter (Signed)
Called pt advised of pharmacist recommendation:"Ok to split medication. Ok if not perfectly even. Just split as evenly as possible". Pt hesitant but I reassured pt it is okay to cut pill half.  Pt will start medication today and come in for f/u labs on 10/12/22.  No further questions or concerns.

## 2022-10-01 NOTE — Telephone Encounter (Signed)
Ok to split medication. Ok if not perfectly even. Just split as evenly as possible.

## 2022-10-12 ENCOUNTER — Ambulatory Visit: Payer: Medicare Other | Attending: Internal Medicine

## 2022-10-12 DIAGNOSIS — I5032 Chronic diastolic (congestive) heart failure: Secondary | ICD-10-CM | POA: Diagnosis not present

## 2022-10-12 DIAGNOSIS — M25552 Pain in left hip: Secondary | ICD-10-CM | POA: Diagnosis not present

## 2022-10-13 ENCOUNTER — Telehealth: Payer: Self-pay

## 2022-10-13 DIAGNOSIS — E875 Hyperkalemia: Secondary | ICD-10-CM

## 2022-10-13 DIAGNOSIS — E1159 Type 2 diabetes mellitus with other circulatory complications: Secondary | ICD-10-CM

## 2022-10-13 DIAGNOSIS — R262 Difficulty in walking, not elsewhere classified: Secondary | ICD-10-CM | POA: Diagnosis not present

## 2022-10-13 DIAGNOSIS — E119 Type 2 diabetes mellitus without complications: Secondary | ICD-10-CM | POA: Diagnosis not present

## 2022-10-13 DIAGNOSIS — M6281 Muscle weakness (generalized): Secondary | ICD-10-CM | POA: Diagnosis not present

## 2022-10-13 LAB — BASIC METABOLIC PANEL
BUN/Creatinine Ratio: 26 (ref 12–28)
BUN: 27 mg/dL (ref 8–27)
CO2: 20 mmol/L (ref 20–29)
Calcium: 10 mg/dL (ref 8.7–10.3)
Chloride: 104 mmol/L (ref 96–106)
Creatinine, Ser: 1.05 mg/dL — ABNORMAL HIGH (ref 0.57–1.00)
Glucose: 108 mg/dL — ABNORMAL HIGH (ref 70–99)
Potassium: 5.5 mmol/L — ABNORMAL HIGH (ref 3.5–5.2)
Sodium: 140 mmol/L (ref 134–144)
eGFR: 53 mL/min/{1.73_m2} — ABNORMAL LOW (ref >59)

## 2022-10-13 NOTE — Telephone Encounter (Signed)
-----   Message from Werner Lean, MD sent at 10/13/2022  8:20 AM EST ----- K increased on low dose aldactone and potassium Stop K supplement and recheck next week

## 2022-10-13 NOTE — Telephone Encounter (Signed)
The patient has been notified of the result and verbalized understanding.  All questions (if any) were answered. Christina Sellers [name], RN 10/13/2022 2:00 PM   Pt will come in for repeat lab on 10/21/22.

## 2022-10-14 ENCOUNTER — Ambulatory Visit
Admission: RE | Admit: 2022-10-14 | Discharge: 2022-10-14 | Disposition: A | Discharge status: Home / Self Care | Payer: Medicare Other | Source: Ambulatory Visit | Attending: Orthopedic Surgery | Admitting: Orthopedic Surgery

## 2022-10-14 DIAGNOSIS — R270 Ataxia, unspecified: Secondary | ICD-10-CM | POA: Diagnosis not present

## 2022-10-14 DIAGNOSIS — M545 Low back pain, unspecified: Secondary | ICD-10-CM | POA: Diagnosis not present

## 2022-10-14 DIAGNOSIS — M48061 Spinal stenosis, lumbar region without neurogenic claudication: Secondary | ICD-10-CM | POA: Diagnosis not present

## 2022-10-19 DIAGNOSIS — M5441 Lumbago with sciatica, right side: Secondary | ICD-10-CM | POA: Diagnosis not present

## 2022-10-21 ENCOUNTER — Ambulatory Visit: Payer: Medicare Other | Attending: Internal Medicine

## 2022-10-21 DIAGNOSIS — E1159 Type 2 diabetes mellitus with other circulatory complications: Secondary | ICD-10-CM | POA: Diagnosis not present

## 2022-10-21 DIAGNOSIS — I152 Hypertension secondary to endocrine disorders: Secondary | ICD-10-CM | POA: Diagnosis not present

## 2022-10-21 DIAGNOSIS — E875 Hyperkalemia: Secondary | ICD-10-CM

## 2022-10-22 DIAGNOSIS — H02401 Unspecified ptosis of right eyelid: Secondary | ICD-10-CM | POA: Diagnosis not present

## 2022-10-22 DIAGNOSIS — H02052 Trichiasis without entropian right lower eyelid: Secondary | ICD-10-CM | POA: Diagnosis not present

## 2022-10-22 LAB — BASIC METABOLIC PANEL
BUN/Creatinine Ratio: 37 — ABNORMAL HIGH (ref 12–28)
BUN: 40 mg/dL — ABNORMAL HIGH (ref 8–27)
CO2: 22 mmol/L (ref 20–29)
Calcium: 9.8 mg/dL (ref 8.7–10.3)
Chloride: 106 mmol/L (ref 96–106)
Creatinine, Ser: 1.09 mg/dL — ABNORMAL HIGH (ref 0.57–1.00)
Glucose: 115 mg/dL — ABNORMAL HIGH (ref 70–99)
Potassium: 4.1 mmol/L (ref 3.5–5.2)
Sodium: 143 mmol/L (ref 134–144)
eGFR: 50 mL/min/{1.73_m2} — ABNORMAL LOW (ref >59)

## 2022-10-25 DIAGNOSIS — M810 Age-related osteoporosis without current pathological fracture: Secondary | ICD-10-CM | POA: Diagnosis not present

## 2022-10-25 DIAGNOSIS — K219 Gastro-esophageal reflux disease without esophagitis: Secondary | ICD-10-CM | POA: Diagnosis not present

## 2022-10-25 DIAGNOSIS — N183 Chronic kidney disease, stage 3 unspecified: Secondary | ICD-10-CM | POA: Diagnosis not present

## 2022-10-25 DIAGNOSIS — I10 Essential (primary) hypertension: Secondary | ICD-10-CM | POA: Diagnosis not present

## 2022-10-25 DIAGNOSIS — E7800 Pure hypercholesterolemia, unspecified: Secondary | ICD-10-CM | POA: Diagnosis not present

## 2022-10-25 DIAGNOSIS — E039 Hypothyroidism, unspecified: Secondary | ICD-10-CM | POA: Diagnosis not present

## 2022-10-25 DIAGNOSIS — J449 Chronic obstructive pulmonary disease, unspecified: Secondary | ICD-10-CM | POA: Diagnosis not present

## 2022-10-26 DIAGNOSIS — R262 Difficulty in walking, not elsewhere classified: Secondary | ICD-10-CM | POA: Diagnosis not present

## 2022-10-26 DIAGNOSIS — M6281 Muscle weakness (generalized): Secondary | ICD-10-CM | POA: Diagnosis not present

## 2022-11-01 DIAGNOSIS — M48062 Spinal stenosis, lumbar region with neurogenic claudication: Secondary | ICD-10-CM | POA: Diagnosis not present

## 2022-11-02 DIAGNOSIS — R262 Difficulty in walking, not elsewhere classified: Secondary | ICD-10-CM | POA: Diagnosis not present

## 2022-11-02 DIAGNOSIS — M6281 Muscle weakness (generalized): Secondary | ICD-10-CM | POA: Diagnosis not present

## 2022-11-09 DIAGNOSIS — R262 Difficulty in walking, not elsewhere classified: Secondary | ICD-10-CM | POA: Diagnosis not present

## 2022-11-09 DIAGNOSIS — M6281 Muscle weakness (generalized): Secondary | ICD-10-CM | POA: Diagnosis not present

## 2022-11-16 DIAGNOSIS — R262 Difficulty in walking, not elsewhere classified: Secondary | ICD-10-CM | POA: Diagnosis not present

## 2022-11-16 DIAGNOSIS — M6281 Muscle weakness (generalized): Secondary | ICD-10-CM | POA: Diagnosis not present

## 2022-11-23 DIAGNOSIS — R262 Difficulty in walking, not elsewhere classified: Secondary | ICD-10-CM | POA: Diagnosis not present

## 2022-11-23 DIAGNOSIS — M6281 Muscle weakness (generalized): Secondary | ICD-10-CM | POA: Diagnosis not present

## 2022-11-26 DIAGNOSIS — R262 Difficulty in walking, not elsewhere classified: Secondary | ICD-10-CM | POA: Diagnosis not present

## 2022-11-26 DIAGNOSIS — M6281 Muscle weakness (generalized): Secondary | ICD-10-CM | POA: Diagnosis not present

## 2022-12-10 DIAGNOSIS — R2681 Unsteadiness on feet: Secondary | ICD-10-CM | POA: Diagnosis not present

## 2022-12-10 DIAGNOSIS — I10 Essential (primary) hypertension: Secondary | ICD-10-CM | POA: Diagnosis not present

## 2022-12-10 DIAGNOSIS — R7303 Prediabetes: Secondary | ICD-10-CM | POA: Diagnosis not present

## 2022-12-10 DIAGNOSIS — R42 Dizziness and giddiness: Secondary | ICD-10-CM | POA: Diagnosis not present

## 2022-12-22 DIAGNOSIS — M6281 Muscle weakness (generalized): Secondary | ICD-10-CM | POA: Diagnosis not present

## 2022-12-22 DIAGNOSIS — R262 Difficulty in walking, not elsewhere classified: Secondary | ICD-10-CM | POA: Diagnosis not present

## 2022-12-24 DIAGNOSIS — N1831 Chronic kidney disease, stage 3a: Secondary | ICD-10-CM | POA: Diagnosis not present

## 2022-12-24 DIAGNOSIS — I130 Hypertensive heart and chronic kidney disease with heart failure and stage 1 through stage 4 chronic kidney disease, or unspecified chronic kidney disease: Secondary | ICD-10-CM | POA: Diagnosis not present

## 2022-12-24 DIAGNOSIS — I5032 Chronic diastolic (congestive) heart failure: Secondary | ICD-10-CM | POA: Diagnosis not present

## 2022-12-24 DIAGNOSIS — I700 Atherosclerosis of aorta: Secondary | ICD-10-CM | POA: Diagnosis not present

## 2022-12-27 DIAGNOSIS — M6281 Muscle weakness (generalized): Secondary | ICD-10-CM | POA: Diagnosis not present

## 2022-12-27 DIAGNOSIS — R262 Difficulty in walking, not elsewhere classified: Secondary | ICD-10-CM | POA: Diagnosis not present

## 2022-12-30 DIAGNOSIS — R262 Difficulty in walking, not elsewhere classified: Secondary | ICD-10-CM | POA: Diagnosis not present

## 2022-12-30 DIAGNOSIS — M6281 Muscle weakness (generalized): Secondary | ICD-10-CM | POA: Diagnosis not present

## 2023-01-04 DIAGNOSIS — R262 Difficulty in walking, not elsewhere classified: Secondary | ICD-10-CM | POA: Diagnosis not present

## 2023-01-04 DIAGNOSIS — M6281 Muscle weakness (generalized): Secondary | ICD-10-CM | POA: Diagnosis not present

## 2023-01-05 DIAGNOSIS — H26491 Other secondary cataract, right eye: Secondary | ICD-10-CM | POA: Diagnosis not present

## 2023-01-05 DIAGNOSIS — Z961 Presence of intraocular lens: Secondary | ICD-10-CM | POA: Diagnosis not present

## 2023-01-05 DIAGNOSIS — H26493 Other secondary cataract, bilateral: Secondary | ICD-10-CM | POA: Diagnosis not present

## 2023-01-07 ENCOUNTER — Ambulatory Visit: Payer: Medicare Other | Admitting: Internal Medicine

## 2023-01-10 DIAGNOSIS — Z5181 Encounter for therapeutic drug level monitoring: Secondary | ICD-10-CM | POA: Diagnosis not present

## 2023-01-14 DIAGNOSIS — R262 Difficulty in walking, not elsewhere classified: Secondary | ICD-10-CM | POA: Diagnosis not present

## 2023-01-14 DIAGNOSIS — M6281 Muscle weakness (generalized): Secondary | ICD-10-CM | POA: Diagnosis not present

## 2023-01-21 ENCOUNTER — Encounter: Payer: Self-pay | Admitting: Internal Medicine

## 2023-01-21 ENCOUNTER — Ambulatory Visit (INDEPENDENT_AMBULATORY_CARE_PROVIDER_SITE_OTHER): Payer: Medicare Other

## 2023-01-21 ENCOUNTER — Ambulatory Visit: Payer: Medicare Other | Attending: Internal Medicine | Admitting: Internal Medicine

## 2023-01-21 VITALS — BP 160/64 | HR 62 | Ht <= 58 in | Wt 134.0 lb

## 2023-01-21 DIAGNOSIS — I152 Hypertension secondary to endocrine disorders: Secondary | ICD-10-CM | POA: Diagnosis not present

## 2023-01-21 DIAGNOSIS — E1159 Type 2 diabetes mellitus with other circulatory complications: Secondary | ICD-10-CM

## 2023-01-21 DIAGNOSIS — I6523 Occlusion and stenosis of bilateral carotid arteries: Secondary | ICD-10-CM

## 2023-01-21 DIAGNOSIS — R002 Palpitations: Secondary | ICD-10-CM | POA: Diagnosis not present

## 2023-01-21 MED ORDER — SPIRONOLACTONE 25 MG PO TABS: 25.0000 mg | ORAL_TABLET | Freq: Every day | ORAL | 3 refills | Status: DC

## 2023-01-21 NOTE — Patient Instructions (Signed)
Medication Instructions:  Your physician has recommended you make the following change in your medication:  INCREASE: spironolactone (Aldactone) to 25 mg by mouth once daily  *If you need a refill on your cardiac medications before your next appointment, please call your pharmacy*   Lab Work: IN 1-2 MONTHS: BMP  If you have labs (blood work) drawn today and your tests are completely normal, you will receive your results only by: [place] (if you have MyChart) OR A paper copy in the mail If you have any lab test that is abnormal or we need to change your treatment, we will call you to review the results.   Testing/Procedures: Your physician has requested that you have a carotid duplex. This test is an ultrasound of the carotid arteries in your neck. It looks at blood flow through these arteries that supply the brain with blood. Allow one hour for this exam. There are no restrictions or special instructions.  Your physician has requested that you wear a heart monitor.    Follow-Up: At [facility], you and your health needs are our priority.  As part of our continuing mission to provide you with exceptional heart care, we have created designated Provider Care Teams.  These Care Teams include your primary Cardiologist (physician) and Advanced Practice Providers (APPs -  Physician Assistants and Nurse Practitioners) who all work together to provide you with the care you need, when you need it.  We recommend signing up for the patient portal called "MyChart".  Sign up information is provided on this After Visit Summary.  MyChart is used to connect with patients for Virtual Visits (Telemedicine).  Patients are able to view lab/test results, encounter notes, upcoming appointments, etc.  Non-urgent messages can be sent to your provider as well.   To learn more about what you can do with MyChart, go to [email or web address].    Your next appointment:   5-6 month(s)  Provider:    Werner Lean, MD     Other Instructions [facility]- Long Term Monitor Instructions  Your physician has requested you wear a ZIO patch monitor for 14 days.  This is a single patch monitor. [employer] supplies one patch monitor per enrollment. Additional stickers are not available. Please do not apply patch if you will be having a Nuclear Stress Test,  Cardiac CT, MRI, or Chest Xray during the period you would be wearing the  monitor. The patch cannot be worn during these tests. You cannot remove and re-apply the  ZIO XT patch monitor.  Your ZIO patch monitor will be mailed 3 day USPS to your address on file. It may take 3-5 days  to receive your monitor after you have been enrolled.  Once you have received your monitor, please review the enclosed instructions. Your monitor  has already been registered assigning a specific monitor serial # to you.  Billing and Patient Assistance Program Information  We have supplied [employer] with any of your insurance information on file for billing purposes. [employer] offers a sliding scale Patient Assistance Program for patients that do not have  insurance, or whose insurance does not completely cover the cost of the ZIO monitor.  You must apply for the Patient Assistance Program to qualify for this discounted rate.  To apply, please call [employer] at [phone], select option 4, select option 2, ask to apply for  Patient Assistance Program. Theodore Demark will ask your household income, and how many people  are in your household. They will quote  your out-of-pocket cost based on that information.  [employer] will also be able to set up a 43-month interest-free payment plan if needed.  Applying the monitor   Shave hair from upper left chest.  Hold abrader disc by orange tab. Rub abrader in 40 strokes over the upper left chest as  indicated in your monitor instructions.  Clean area with 4 enclosed alcohol pads. Let dry.  Apply patch as indicated in monitor  instructions. Patch will be placed under collarbone on left  side of chest with arrow pointing upward.  Rub patch adhesive wings for 2 minutes. Remove white label marked "1". Remove the white  label marked "2". Rub patch adhesive wings for 2 additional minutes.  While looking in a mirror, press and release button in center of patch. A small green light will  flash 3-4 times. This will be your only indicator that the monitor has been turned on.  Do not shower for the first 24 hours. You may shower after the first 24 hours.  Press the button if you feel a symptom. You will hear a small click. Record Date, Time and  Symptom in the Patient Logbook.  When you are ready to remove the patch, follow instructions on the last 2 pages of Patient  Logbook. Stick patch monitor onto the last page of Patient Logbook.  Place Patient Logbook in the blue and white box. Use locking tab on box and tape box closed  securely. The blue and white box has prepaid postage on it. Please place it in the mailbox as  soon as possible. Your physician should have your test results approximately 7 days after the  monitor has been [facility]  Call [facility]at [phone] you have questions regarding  your ZIO XT patch monitor. Call them immediately if you see an orange light blinking on your  monitor.  If your monitor falls off in less than 4 days, contact our Monitor department at [phone]  If your monitor becomes loose or falls off after 4 days call [employer] at [phone]  suggestions on securing your monitor

## 2023-01-21 NOTE — Progress Notes (Signed)
Cardiology Office Note:    Date:  01/21/2023   ID:  [name], DOB [birth year], MRN [id number]  PCP:  Josetta Huddle, MD  Provider Notes:  Hearing Impaired   [place]  Cardiologist:  Werner Lean, MD  Advanced Practice Provider:  No care team member to display Electrophysiologist:  None      CC: Follow up PAD  History of Present Illness:    Christina Sellers is a 82 y.o. female with a hx of PAD (CAS), hx of COPD with Asthma, HLD with T2DM with aortic atherosclerosis, HTN with DM who presents for evaluation 01/21/21. 2022: Had syncope issues including syncope during an office visit.  Seen in the hospital during that admission and her diuretics and BB were stopped due to intolerance. Had right hip surgery 03/30/21.  Has done better of CCB and thiazide 2023: TR improved.  BP high.  Had issues with hydralazine and increased norvasc with Dr. Inda Merlin Dr. Inda Merlin retired.  Now sees Dr. Louis Matte  started low dose MRA.  2024: Started on SGLT2i.  Patient notes that she is doing well.   Her youngest granddaughter who is coming today- she got accept to [employer] There are no interval hospital/ED visit.   Started on SGLT2i and feel better.  She feels happier too.  No chest pain or pressure .  No SOB/DOE and no PND/Orthopnea.  . Leg swelling is much improved from prior.    Ambulatory blood pressure is controlled at home: 150-160.   Past Medical History:  Diagnosis Date   Abnormal Pap smear of vagina    Allergic sinusitis    Anxiety    Atherosclerotic peripheral vascular disease (HCC)    Carotid artery stenosis    Cellulitis    COPD with asthma    not an issue now   Depression    Diabetes mellitus type 2, uncomplicated (HCC)    DJD (degenerative joint disease)    Elevated cholesterol    Elevated MCV    secondary to alchol   GERD (gastroesophageal reflux disease)    not an issue now.   Gout    Hearing difficulty    bilateral hearing  aids   History of recurrent UTIs    Hypertension    loss weight'off meds now"   Hypothyroidism    IBS (irritable bowel syndrome)    Insomnia    Labial fusion    Osteoarthritis of left knee    Osteopenia    Recurrent vaginitis    Sjogren's syndrome ([place])    Syncope 11.7.14   secondary to dehydration and possible hypoglycemia   Tendonitis    Thyroid disease    Transfusion history    age 41 "anemia"    Past Surgical History:  Procedure Laterality Date   CATARACT EXTRACTION, BILATERAL Bilateral    CHOLECYSTECTOMY     open   COLONOSCOPY WITH PROPOFOL N/A 03/23/2016   Procedure: COLONOSCOPY WITH PROPOFOL;  Surgeon: Garlan Fair, MD;  Location: [facility];  Service: Endoscopy;  Laterality: N/A;   EYE SURGERY     FOOT SURGERY Left    retained hardware   KNEE SURGERY Left    scope    TONSILLECTOMY     TOTAL HIP ARTHROPLASTY Right 03/30/2021   Procedure: RIGHTTOTAL HIP ARTHROPLASTY ANTERIOR APPROACH;  Surgeon: Frederik Pear, MD;  Location: [facility];  Service: Orthopedics;  Laterality: Right;    Current Medications: Current Meds  Medication Sig   acetaminophen (TYLENOL) 650 MG CR  tablet 2 tablets in the morning and at bedtime.   amLODipine (NORVASC) 2.5 MG tablet Take 5 mg by mouth daily.   ascorbic acid (VITAMIN C) 500 MG tablet Take 500 mg by mouth daily.   Cholecalciferol (VITAMIN D3) 50 MCG (2000 UT) capsule Take 2,000 mg by mouth daily.   clobetasol ointment (TEMOVATE) 0.05 % Apply to vulva daily x 1 week, then every other day x 1 week, then twice weekly. (Patient taking differently: as needed. Apply to vulva daily x 1 week, then every other day x 1 week, then twice weekly.)   cloNIDine (CATAPRES) 0.1 MG tablet Take 0.2 mg by mouth at bedtime.   Cyanocobalamin (VITAMIN B12) 1000 MCG TBCR daily.   denosumab (PROLIA) 60 MG/ML SOSY injection Inject 60 mg into the skin every 6 (six) months.   diclofenac sodium (VOLTAREN) 1 % GEL Apply 1 application topically 2 (two) times daily  as needed (to painful sites).   empagliflozin (JARDIANCE) 10 MG TABS tablet Take by mouth daily.   finasteride (PROSCAR) 5 MG tablet Take 2.5 mg by mouth daily.   fluticasone (FLONASE) 50 MCG/ACT nasal spray Place 1 spray into both nostrils daily as needed (for seasonal allergies).   furosemide (LASIX) 20 MG tablet Take 1 tablet (20 mg total) by mouth daily.   hydrocortisone 2.5 % cream Apply topically as needed.   hypromellose (SYSTANE OVERNIGHT THERAPY) 0.3 % GEL ophthalmic ointment Place 1 application into both eyes at bedtime as needed for dry eyes.   levothyroxine (SYNTHROID) 75 MCG tablet Take 75 mcg by mouth every morning.   meclizine (ANTIVERT) 25 MG tablet Take 25 mg by mouth as needed for dizziness.   meloxicam (MOBIC) 7.5 MG tablet Take by mouth daily as needed.   Multiple Vitamins-Minerals (MULTIVITAMIN WITH MINERALS) tablet Take 1 tablet by mouth daily.   nystatin cream (MYCOSTATIN) Apply 1 application topically 2 (two) times daily. (Patient taking differently: Apply 1 application  topically as needed for dry skin.)   OVER THE COUNTER MEDICATION Take 2 capsules by mouth daily. Hydro Eye   Polyvinyl Alcohol-Povidone PF (REFRESH) 1.4-0.6 % SOLN as needed.   Psyllium (METAMUCIL PO) Take 1 Scoop by mouth at bedtime as needed (constipation- mix and drink as directed).   RESTASIS 0.05 % ophthalmic emulsion Place 1 drop into both eyes 2 (two) times daily.   rosuvastatin (CRESTOR) 5 MG tablet Take 5 mg by mouth daily.   spironolactone (ALDACTONE) 25 MG tablet Take 1 tablet (25 mg total) by mouth daily.   vitamin E 200 UNIT capsule Take 200 Units by mouth daily.   [DISCONTINUED] CALCIUM 600 1500 (600 Ca) MG TABS tablet daily.   [DISCONTINUED] donepezil (ARICEPT) 5 MG tablet Take 5 mg by mouth at bedtime.   [DISCONTINUED] fluorouracil (EFUDEX) 5 % cream SMARTSIG:sparingly Topical As Directed   [DISCONTINUED] spironolactone (ALDACTONE) 25 MG tablet Take 0.5 tablets (12.5 mg total) by mouth  daily.   [DISCONTINUED] triamcinolone cream (KENALOG) 0.1 % SMARTSIG:sparingly Topical Twice Daily     Allergies:   Tape, Celexa [citalopram hydrobromide], Fluconazole, Green tea leaf ext, Kenalog [triamcinolone], Levaquin [levofloxacin], Losartan potassium-hctz, Magnesium citrate, Methocarbamol, Nitrofurantoin macrocrystal, Scopolamine, Sulfa antibiotics, Tea, and Trimethoprim   Social History   Socioeconomic History   Marital status: Widowed    Spouse name: Not on file   Number of children: Not on file   Years of education: Not on file   Highest education level: Not on file  Occupational History   Not on file  Tobacco Use   Smoking status: Former    Types: Cigarettes    Quit date: 02/21/1982    Years since quitting: 40.9   Smokeless tobacco: Never  Vaping Use   Vaping Use: Never used  Substance and Sexual Activity   Alcohol use: No   Drug use: No   Sexual activity: Never    Birth control/protection: Post-menopausal  Other Topics Concern   Not on file  Social History Narrative   Not on file   Social Determinants of Health   Financial Resource Strain: Not on file  Food Insecurity: Not on file  Transportation Needs: Not on file  Physical Activity: Not on file  Stress: Not on file  Social Connections: Not on file    Social:  Daughter is in [place] and had two kids' she has multiple family members one who graduated Facilities manager school (grandson in [place]); can't come see her grandkids because of her hip pain.  Family History: The patient's family history includes Breast cancer in her sister; Cancer in her brother and father; Diabetes in her maternal aunt.  ROS:   Please see the history of present illness.   All other systems reviewed and are negative.  EKGs/Labs/Other Studies Reviewed:    The following studies were reviewed today:  EKG:   03/24/22: SR 66 01/21/21: SR 65 WNL  Cardiac Studies & Procedures     STRESS TESTS  MYOCARDIAL PERFUSION IMAGING  04/14/2018  Narrative  Nuclear stress EF: 77%.  The left ventricular ejection fraction is hyperdynamic (>65%).  There was no ST segment deviation noted during stress.  The study is normal.  This is a low risk study.  Normal pharmacologic nuclear stress test with no evidence for prior infarct or ischemia. Hyperdynamic LVEF.   ECHOCARDIOGRAM  ECHOCARDIOGRAM COMPLETE 04/08/2022  Narrative ECHOCARDIOGRAM REPORT    Patient Name:   AIYSHA FREIWALD Date of Exam: 04/08/2022 Medical Rec #:  [id number]         Height:       58.0 in Accession #:    [id number]        Weight:       136.0 lb Date of Birth:  [birth year]         BSA:          1.546 m Patient Age:    29 years          BP:           185/70 mmHg Patient Gender: F                 HR:           76 bpm. Exam Location:  [place]  Procedure: 2D Echo, 3D Echo, Color Doppler, Cardiac Doppler and Strain Analysis  Indications:    R06.09 Dyspnea on exertion  History:        Patient has prior history of Echocardiogram examinations, most recent 02/02/2021. PVD and COPD, Signs/Symptoms:Orthostatic hypotension, Syncope and Shortness of Breath; Risk Factors:Hypertension, Diabetes, Sleep Apnea and Former Smoker.  Sonographer:    Basilia J[facility], RDCS Referring Phys: [id number] [facility] A Toma Erichsen  IMPRESSIONS   1. Left ventricular ejection fraction, by estimation, is 60 to 65%. The left ventricle has normal function. The left ventricle has no regional wall motion abnormalities. Left ventricular diastolic parameters are consistent with Grade I diastolic dysfunction (impaired relaxation). The average left ventricular global longitudinal strain is -24.5 %. The global longitudinal strain is normal. 2. Right ventricular systolic function is normal.  The right ventricular size is normal. There is normal pulmonary artery systolic pressure. The estimated right ventricular systolic pressure is 123XX123 mmHg. 3. Left atrial size was moderately  dilated. 4. The mitral valve is normal in structure. No evidence of mitral valve regurgitation. No evidence of mitral stenosis. 5. The aortic valve is tricuspid. Aortic valve regurgitation is not visualized. Aortic valve sclerosis/calcification is present, without any evidence of aortic stenosis. 6. The inferior vena cava is normal in size with greater than 50% respiratory variability, suggesting right atrial pressure of 3 mmHg.  Comparison(s): 02/02/21 EF 65-70. Mild-moderate TR.  FINDINGS Left Ventricle: Left ventricular ejection fraction, by estimation, is 60 to 65%. The left ventricle has normal function. The left ventricle has no regional wall motion abnormalities. The average left ventricular global longitudinal strain is -24.5 %. The global longitudinal strain is normal. 3D left ventricular ejection fraction analysis performed but not reported based on interpreter judgement due to suboptimal tracking. The left ventricular internal cavity size was normal in size. There is no left ventricular hypertrophy. Left ventricular diastolic parameters are consistent with Grade I diastolic dysfunction (impaired relaxation). Normal left ventricular filling pressure.  Right Ventricle: The right ventricular size is normal. No increase in right ventricular wall thickness. Right ventricular systolic function is normal. There is normal pulmonary artery systolic pressure. The tricuspid regurgitant velocity is 2.79 m/s, and with an assumed right atrial pressure of 3 mmHg, the estimated right ventricular systolic pressure is 123XX123 mmHg.  Left Atrium: Left atrial size was moderately dilated.  Right Atrium: Right atrial size was normal in size.  Pericardium: There is no evidence of pericardial effusion.  Mitral Valve: The mitral valve is normal in structure. Mild to moderate mitral annular calcification. No evidence of mitral valve regurgitation. No evidence of mitral valve stenosis.  Tricuspid Valve: The  tricuspid valve is normal in structure. Tricuspid valve regurgitation is trivial. No evidence of tricuspid stenosis.  Aortic Valve: The aortic valve is tricuspid. Aortic valve regurgitation is not visualized. Aortic valve sclerosis/calcification is present, without any evidence of aortic stenosis.  Pulmonic Valve: The pulmonic valve was normal in structure. Pulmonic valve regurgitation is not visualized. No evidence of pulmonic stenosis.  Aorta: The aortic root is normal in size and structure.  Venous: The inferior vena cava is normal in size with greater than 50% respiratory variability, suggesting right atrial pressure of 3 mmHg.  IAS/Shunts: No atrial level shunt detected by color flow Doppler.   LEFT VENTRICLE PLAX 2D LVIDd:         4.40 cm   Diastology LVIDs:         2.90 cm   LV e' medial:    6.96 cm/s LV PW:         0.90 cm   LV E/e' medial:  9.7 LV IVS:        1.00 cm   LV e' lateral:   6.96 cm/s LVOT diam:     2.00 cm   LV E/e' lateral: 9.7 LV SV:         80 LV SV Index:   52        2D Longitudinal Strain LVOT Area:     3.14 cm  2D Strain GLS (A2C):   -23.4 % 2D Strain GLS (A3C):   -26.7 % 2D Strain GLS (A4C):   -23.4 % 2D Strain GLS Avg:     -24.5 %  3D Volume EF: 3D EF:        58 %  LV EDV:       100 ml LV ESV:       42 ml LV SV:        58 ml  RIGHT VENTRICLE             IVC RV Basal diam:  3.50 cm     IVC diam: 1.80 cm RV S prime:     13.40 cm/s TAPSE (M-mode): 2.7 cm RVSP:           34.1 mmHg  LEFT ATRIUM             Index        RIGHT ATRIUM           Index LA diam:        4.00 cm 2.59 cm/m   RA Pressure: 3.00 mmHg LA Vol (A2C):   70.2 ml 45.41 ml/m  RA Area:     14.10 cm LA Vol (A4C):   57.0 ml 36.87 ml/m  RA Volume:   38.60 ml  24.97 ml/m LA Biplane Vol: 63.8 ml 41.27 ml/m AORTIC VALVE LVOT Vmax:   116.00 cm/s LVOT Vmean:  79.100 cm/s LVOT VTI:    0.256 m  AORTA Ao Root diam: 2.70 cm Ao Asc diam:  2.50 cm  MITRAL VALVE               TRICUSPID  VALVE TR Peak grad:   31.1 mmHg MV Decel Time: 197 msec    TR Vmax:        279.00 cm/s MV E velocity: 67.60 cm/s  Estimated RAP:  3.00 mmHg MV A velocity: 96.90 cm/s  RVSP:           34.1 mmHg MV E/A ratio:  0.70 SHUNTS Systemic VTI:  0.26 m Systemic Diam: 2.00 cm  Fransico Him MD Electronically signed by Fransico Him MD Signature Date/Time: 04/08/2022/3:49:05 PM    Final              Carotid Duplex: Date:02/14/21 Results: Summary:  Right Carotid: Velocities in the right ICA are consistent with a 40-59%                 stenosis.   Left Carotid: Velocities in the left ICA are consistent with a 1-39%  stenosis.   Vertebrals:  Right vertebral artery demonstrates antegrade flow. Left  vertebral               artery demonstrates bidirectional flow.  Subclavians: Normal flow hemodynamics were seen in bilateral subclavian               arteries.   LE Dupelx Venous: Date: 02/15/21 Results: Summary:  BILATERAL:  - No evidence of deep vein thrombosis seen in the lower extremities,  bilaterally.  -No evidence of popliteal cyst, bilaterally.  RIGHT:  - No cystic structure found in the popliteal fossa.     LEFT:  - No cystic structure found in the popliteal fossa.    Recent Labs: 04/10/2022: B Natriuretic Peptide 249.4; Hemoglobin 11.5; Platelets 174 09/24/2022: NT-Pro BNP 749 10/21/2022: BUN 40; Creatinine, Ser 1.09; Potassium 4.1; Sodium 143  Recent Lipid Panel    Component Value Date/Time   CHOL 149 08/26/2011 0400   TRIG 195 (H) 08/26/2011 0400   HDL 37 (L) 08/26/2011 0400   CHOLHDL 4.0 08/26/2011 0400   VLDL 39 08/26/2011 0400   LDLCALC 73 08/26/2011 0400    Physical Exam:    VS:  BP (!) 160/64   Pulse  62   Ht 4' 10"$  (1.473 m)   Wt 134 lb (60.8 kg)   SpO2 99%   BMI 28.01 kg/m     Wt Readings from Last 3 Encounters:  01/21/23 134 lb (60.8 kg)  09/24/22 137 lb 3.2 oz (62.2 kg)  09/12/22 138 lb (62.6 kg)    GEN:  Well nourished, well developed in no  distress HEENT: Normal LYMPHATICS: No lymphadenopathy  CARDIAC: RRR no systolic murmur RESPIRATORY:  Clear to auscultation without rales, wheezing or rhonchi  ABDOMEN: Soft, non-tender, non-distended MUSCULOSKELETAL:  trace non pitting bilateral edema NEUROLOGIC:  Alert and oriented X3 PSYCHIATRIC:  Normal affect   ASSESSMENT:    1. Palpitations   2. Bilateral carotid artery stenosis   3. Hypertension associated with diabetes ([place])     PLAN:    Diastolic Dysfunction Diabetes with Hypertension With Orthostatic Hypotension Leg swelling with prior knee replacements Multi-factorial  - in the past had near-syncope with HCTZ and CCB (high dose); had leg swelling with hydralazine - on lasix 20 mg PO daily  - on MRA- increase to 25 mg and labs in 1-2 weeks - continue SGLT2i   HLD with DM Mild CAS on aspirin  Aortic atherosclerosis -LDL goal less than 70 at goal no change in therapy - will get bilateral carotid duplex  Palpitations - have persisted; needs 2 week non live ziopatch placed.  5-6 months with me or APP     Medication Adjustments/Labs and Tests Ordered: Current medicines are reviewed at length with the patient today.  Concerns regarding medicines are outlined above.  Orders Placed This Encounter  Procedures   Basic metabolic panel   LONG TERM MONITOR (3-14 DAYS)   VAS US CAROTID    Meds ordered this encounter  Medications   spironolactone (ALDACTONE) 25 MG tablet    Sig: Take 1 tablet (25 mg total) by mouth daily.    Dispense:  90 tablet    Refill:  3     Patient Instructions  Medication Instructions:  Your physician has recommended you make the following change in your medication:  INCREASE: spironolactone (Aldactone) to 25 mg by mouth once daily  *If you need a refill on your cardiac medications before your next appointment, please call your pharmacy*   Lab Work: IN 1-2 MONTHS: BMP  If you have labs (blood work) drawn today and your tests  are completely normal, you will receive your results only by: [facility] (if you have MyChart) OR A paper copy in the mail If you have any lab test that is abnormal or we need to change your treatment, we will call you to review the results.   Testing/Procedures: Your physician has requested that you have a carotid duplex. This test is an ultrasound of the carotid arteries in your neck. It looks at blood flow through these arteries that supply the brain with blood. Allow one hour for this exam. There are no restrictions or special instructions.  Your physician has requested that you wear a heart monitor.    Follow-Up: At [facility], you and your health needs are our priority.  As part of our continuing mission to provide you with exceptional heart care, we have created designated Provider Care Teams.  These Care Teams include your primary Cardiologist (physician) and Advanced Practice Providers (APPs -  Physician Assistants and Nurse Practitioners) who all work together to provide you with the care you need, when you need it.  We recommend signing up for the  patient portal called "MyChart".  Sign up information is provided on this After Visit Summary.  MyChart is used to connect with patients for Virtual Visits (Telemedicine).  Patients are able to view lab/test results, encounter notes, upcoming appointments, etc.  Non-urgent messages can be sent to your provider as well.   To learn more about what you can do with MyChart, go to [email or web address].    Your next appointment:   5-6 month(s)  Provider:   Werner Lean, MD     Other Instructions [place] [place]- Long Term Monitor Instructions  Your physician has requested you wear a ZIO patch monitor for 14 days.  This is a single patch monitor. [employer] supplies one patch monitor per enrollment. Additional stickers are not available. Please do not apply patch if you will be having a Nuclear Stress Test,  Cardiac CT, MRI,  or Chest Xray during the period you would be wearing the  monitor. The patch cannot be worn during these tests. You cannot remove and re-apply the  ZIO XT patch monitor.  Your ZIO patch monitor will be mailed 3 day USPS to your address on file. It may take 3-5 days  to receive your monitor after you have been enrolled.  Once you have received your monitor, please review the enclosed instructions. Your monitor  has already been registered assigning a specific monitor serial # to you.  Billing and Patient Assistance Program Information  We have supplied [employer] with any of your insurance information on file for billing purposes. [employer] offers a sliding scale Patient Assistance Program for patients that do not have  insurance, or whose insurance does not completely cover the cost of the ZIO monitor.  You must apply for the Patient Assistance Program to qualify for this discounted rate.  To apply, please call [employer] at [phone], select option 4, select option 2, ask to apply for  Patient Assistance Program. Theodore Demark will ask your household income, and how many people  are in your household. They will quote your out-of-pocket cost based on that information.  [employer] will also be able to set up a 33-month interest-free payment plan if needed.  Applying the monitor   Shave hair from upper left chest.  Hold abrader disc by orange tab. Rub abrader in 40 strokes over the upper left chest as  indicated in your monitor instructions.  Clean area with 4 enclosed alcohol pads. Let dry.  Apply patch as indicated in monitor instructions. Patch will be placed under collarbone on left  side of chest with arrow pointing upward.  Rub patch adhesive wings for 2 minutes. Remove white label marked "1". Remove the white  label marked "2". Rub patch adhesive wings for 2 additional minutes.  While looking in a mirror, press and release button in center of patch. A small green light will  flash 3-4 times.  This will be your only indicator that the monitor has been turned on.  Do not shower for the first 24 hours. You may shower after the first 24 hours.  Press the button if you feel a symptom. You will hear a small click. Record Date, Time and  Symptom in the Patient Logbook.  When you are ready to remove the patch, follow instructions on the last 2 pages of Patient  Logbook. Stick patch monitor onto the last page of Patient Logbook.  Place Patient Logbook in the blue and white box. Use locking tab on box and tape box closed  securely. The blue and white  box has prepaid postage on it. Please place it in the mailbox as  soon as possible. Your physician should have your test results approximately 7 days after the  monitor has been mailed back to [facility].  Call [place] at [phone] if you have questions regarding  your ZIO XT patch monitor. Call them immediately if you see an orange light blinking on your  monitor.  If your monitor falls off in less than 4 days, contact our Monitor department at [phone].  If your monitor becomes loose or falls off after 4 days call [employer] at [phone] for  suggestions on securing your monitor     Signed, Werner Lean, MD  01/21/2023 12:32 PM    [place]

## 2023-01-21 NOTE — Progress Notes (Unsigned)
Applied a 14 day Zio XT monitor to patient in the office ?

## 2023-01-25 DIAGNOSIS — M6281 Muscle weakness (generalized): Secondary | ICD-10-CM | POA: Diagnosis not present

## 2023-01-25 DIAGNOSIS — R262 Difficulty in walking, not elsewhere classified: Secondary | ICD-10-CM | POA: Diagnosis not present

## 2023-01-26 DIAGNOSIS — H26492 Other secondary cataract, left eye: Secondary | ICD-10-CM | POA: Diagnosis not present

## 2023-01-28 ENCOUNTER — Other Ambulatory Visit: Payer: Medicare Other

## 2023-02-03 ENCOUNTER — Ambulatory Visit (HOSPITAL_COMMUNITY)
Admission: RE | Admit: 2023-02-03 | Discharge: 2023-02-03 | Disposition: A | Discharge status: Home / Self Care | Payer: Medicare Other | Source: Ambulatory Visit | Attending: Internal Medicine | Admitting: Internal Medicine

## 2023-02-03 DIAGNOSIS — I6523 Occlusion and stenosis of bilateral carotid arteries: Secondary | ICD-10-CM

## 2023-02-08 DIAGNOSIS — R002 Palpitations: Secondary | ICD-10-CM | POA: Diagnosis not present

## 2023-02-09 ENCOUNTER — Ambulatory Visit (INDEPENDENT_AMBULATORY_CARE_PROVIDER_SITE_OTHER): Payer: Medicare Other | Admitting: Nurse Practitioner

## 2023-02-09 ENCOUNTER — Encounter: Payer: Self-pay | Admitting: Nurse Practitioner

## 2023-02-09 VITALS — BP 122/80 | Ht 59.0 in | Wt 136.0 lb

## 2023-02-09 DIAGNOSIS — Z780 Asymptomatic menopausal state: Secondary | ICD-10-CM

## 2023-02-09 DIAGNOSIS — Z01419 Encounter for gynecological examination (general) (routine) without abnormal findings: Secondary | ICD-10-CM | POA: Diagnosis not present

## 2023-02-09 DIAGNOSIS — L900 Lichen sclerosus et atrophicus: Secondary | ICD-10-CM | POA: Diagnosis not present

## 2023-02-09 DIAGNOSIS — N812 Incomplete uterovaginal prolapse: Secondary | ICD-10-CM

## 2023-02-09 MED ORDER — CLOBETASOL PROPIONATE 0.05 % EX OINT: TOPICAL_OINTMENT | CUTANEOUS | 1 refills | Status: DC

## 2023-02-09 NOTE — Progress Notes (Signed)
Christina Sellers 1941/03/16 [id number]   History:  82 y.o. G2P2003 presents for breast and pelvic exam. Postmenopausal. Normal pap history. H/O lichen sclerosus with labial fusion. Has not been using topical steroid cream. Uses A&D ointment. HLD, DM, hypothyroidism, osteoporosis (on Prolia) managed by PCP. Cystocele and 3rd degree uterine prolapse. Saw urogynecology last year and discussed pessary but she was not ready for that. She would like to move forward with this.  Gynecologic History No LMP recorded. Patient is postmenopausal.   Contraception: post menopausal status Sexually active: No  Health Maintenance Last Pap: 10/11/2011. Results were: Normal Last mammogram: 2022. Results were: Normal per patient Last colonoscopy: 03/23/2016 Last Dexa: 2022 per patient  Past medical history, past surgical history, family history and social history were all reviewed and documented in the EPIC chart. Widowed. Son lives local. Twins daughters - one in [place], one in [place].   ROS:  A ROS was performed and pertinent positives and negatives are included.  Exam:  Vitals:   02/09/23 1059  BP: 122/80  Weight: 136 lb (61.7 kg)  Height: '4\' 11"'$  (1.499 m)   Body mass index is 27.47 kg/m.  General appearance:  Normal Thyroid:  Symmetrical, normal in size, without palpable masses or nodularity. Respiratory  Auscultation:  Clear without wheezing or rhonchi Cardiovascular  Auscultation:  Regular rate, without rubs, murmurs or gallops  Edema/varicosities:  Not grossly evident Abdominal  Soft,nontender, without masses, guarding or rebound.  Liver/spleen:  No organomegaly noted  Hernia:  None appreciated  Skin  Inspection:  Grossly normal Breasts: Examined lying and sitting.   Right: Without masses, retractions, nipple discharge or axillary adenopathy.   Left: Without masses, retractions, nipple discharge or axillary adenopathy. Genitourinary   Inguinal/mons:  Normal without inguinal  adenopathy  External genitalia:  Fusion of superior labia, small introitus due to this  BUS/Urethra/Skene's glands:  Normal  Vagina:  Uterovaginal prolapse, small amount of protrusion outside of vaginal opening. Normal appearing with normal color and discharge, no lesions  Cervix:  Normal appearing without discharge or lesions  Uterus:  Normal in size, shape and contour.  Midline and mobile, nontender  Adnexa/parametria:     Rt: Normal in size, without masses or tenderness.   Lt: Normal in size, without masses or tenderness.  Anus and perineum: Normal  Digital rectal exam: Deferred  Patient informed chaperone available to be present for breast and pelvic exam. Patient has requested no chaperone to be present. Patient has been advised what will be completed during breast and pelvic exam.   Assessment/Plan:  82 y.o. G2P2003 for breast and pelvic exam.   Encounter for breast and pelvic examination - Education provided on SBEs, importance of preventative screenings, current guidelines, high calcium diet, regular exercise, and multivitamin daily.  Labs done elsewhere.   Lichen sclerosus - Plan: clobetasol ointment (TEMOVATE) 0.05 % twice weekly. A&D also works for her. Recommend continuing use as needed.   Postmenopausal - no HRT, no bleeding  Incomplete uterovaginal prolapse - Saw UroGyn 04/2022 where they discussed pessary and she wanted to think about it. She is now ready to pursue and plans to call to schedule pessary fitting there. Provided her with office #. She is aware Dr. Wannetta Sender will be out of office for a few months starting in May but she can see NP if she does not want to wait.   Screening for cervical cancer - Normal Pap history. No longer screening per guidelines.   Screening for breast cancer - Normal mammogram  history.  Prefers screenings every 2 years. Normal breast exam today.  Screening for colon cancer - 2017 colonoscopy. Screenings no longer recommended d/t age.    Return in 1 year for medication management.     Tamela Gammon DNP, 12:40 PM 02/09/2023

## 2023-02-14 ENCOUNTER — Telehealth: Payer: Self-pay | Admitting: Internal Medicine

## 2023-02-14 DIAGNOSIS — I152 Hypertension secondary to endocrine disorders: Secondary | ICD-10-CM

## 2023-02-14 NOTE — Telephone Encounter (Signed)
-----   Message from Werner Lean, MD sent at 02/12/2023  3:40 PM EST ----- Results: Rare PACs Plan: Offer coreg 6.25 mg PO BID  Werner Lean, MD

## 2023-02-14 NOTE — Telephone Encounter (Signed)
Patient returned RN's call. 

## 2023-02-14 NOTE — Telephone Encounter (Signed)
The patient has been notified of the result and verbalized understanding.  All questions (if any) were answered. Bedelia Person Shayra Anton, RN 02/14/2023 4:44 PM   Pt does not want to start new medication without speaking with MD first.  Advised pt what PAC's are pt still uncomfortable with starting medication.  Reports is symptomatic at night while lying down. OV scheduled for 03/07/23 per pt request.  Pt reports received spironolactone 25 mg PO QD.  Wants to know why medication was increased.  Advised med increased at [place] on 01/21/23 d/t elevated BP.  Pt expresses will start increased dose today.  Will come in for BMP on 02/28/23.

## 2023-02-21 DIAGNOSIS — L918 Other hypertrophic disorders of the skin: Secondary | ICD-10-CM | POA: Diagnosis not present

## 2023-02-21 DIAGNOSIS — L570 Actinic keratosis: Secondary | ICD-10-CM | POA: Diagnosis not present

## 2023-02-21 DIAGNOSIS — L821 Other seborrheic keratosis: Secondary | ICD-10-CM | POA: Diagnosis not present

## 2023-02-21 DIAGNOSIS — D1801 Hemangioma of skin and subcutaneous tissue: Secondary | ICD-10-CM | POA: Diagnosis not present

## 2023-02-22 ENCOUNTER — Other Ambulatory Visit (HOSPITAL_BASED_OUTPATIENT_CLINIC_OR_DEPARTMENT_OTHER): Payer: Self-pay

## 2023-02-25 DIAGNOSIS — I6523 Occlusion and stenosis of bilateral carotid arteries: Secondary | ICD-10-CM | POA: Diagnosis not present

## 2023-02-25 DIAGNOSIS — N183 Chronic kidney disease, stage 3 unspecified: Secondary | ICD-10-CM | POA: Diagnosis not present

## 2023-02-25 DIAGNOSIS — I130 Hypertensive heart and chronic kidney disease with heart failure and stage 1 through stage 4 chronic kidney disease, or unspecified chronic kidney disease: Secondary | ICD-10-CM | POA: Diagnosis not present

## 2023-02-25 DIAGNOSIS — I5032 Chronic diastolic (congestive) heart failure: Secondary | ICD-10-CM | POA: Diagnosis not present

## 2023-02-25 DIAGNOSIS — Z0000 Encounter for general adult medical examination without abnormal findings: Secondary | ICD-10-CM | POA: Diagnosis not present

## 2023-02-25 DIAGNOSIS — M810 Age-related osteoporosis without current pathological fracture: Secondary | ICD-10-CM | POA: Diagnosis not present

## 2023-02-25 DIAGNOSIS — E039 Hypothyroidism, unspecified: Secondary | ICD-10-CM | POA: Diagnosis not present

## 2023-02-25 DIAGNOSIS — E7800 Pure hypercholesterolemia, unspecified: Secondary | ICD-10-CM | POA: Diagnosis not present

## 2023-02-25 DIAGNOSIS — R7303 Prediabetes: Secondary | ICD-10-CM | POA: Diagnosis not present

## 2023-02-25 DIAGNOSIS — Z9181 History of falling: Secondary | ICD-10-CM | POA: Diagnosis not present

## 2023-02-25 DIAGNOSIS — E559 Vitamin D deficiency, unspecified: Secondary | ICD-10-CM | POA: Diagnosis not present

## 2023-02-28 ENCOUNTER — Ambulatory Visit: Payer: Medicare Other | Attending: Internal Medicine

## 2023-02-28 DIAGNOSIS — I152 Hypertension secondary to endocrine disorders: Secondary | ICD-10-CM

## 2023-02-28 DIAGNOSIS — E1159 Type 2 diabetes mellitus with other circulatory complications: Secondary | ICD-10-CM | POA: Diagnosis not present

## 2023-02-28 LAB — BASIC METABOLIC PANEL
BUN/Creatinine Ratio: 27 (ref 12–28)
BUN: 37 mg/dL — ABNORMAL HIGH (ref 8–27)
CO2: 20 mmol/L (ref 20–29)
Calcium: 10.3 mg/dL (ref 8.7–10.3)
Chloride: 103 mmol/L (ref 96–106)
Creatinine, Ser: 1.38 mg/dL — ABNORMAL HIGH (ref 0.57–1.00)
Glucose: 157 mg/dL — ABNORMAL HIGH (ref 70–99)
Potassium: 4.2 mmol/L (ref 3.5–5.2)
Sodium: 136 mmol/L (ref 134–144)
eGFR: 38 mL/min/{1.73_m2} — ABNORMAL LOW (ref >59)

## 2023-03-07 ENCOUNTER — Ambulatory Visit: Payer: Medicare Other | Attending: Internal Medicine | Admitting: Internal Medicine

## 2023-03-07 ENCOUNTER — Encounter: Payer: Self-pay | Admitting: Internal Medicine

## 2023-03-07 VITALS — BP 148/58 | HR 64 | Ht <= 58 in | Wt 133.0 lb

## 2023-03-07 DIAGNOSIS — E1159 Type 2 diabetes mellitus with other circulatory complications: Secondary | ICD-10-CM

## 2023-03-07 DIAGNOSIS — E1169 Type 2 diabetes mellitus with other specified complication: Secondary | ICD-10-CM

## 2023-03-07 DIAGNOSIS — I152 Hypertension secondary to endocrine disorders: Secondary | ICD-10-CM | POA: Diagnosis not present

## 2023-03-07 DIAGNOSIS — E785 Hyperlipidemia, unspecified: Secondary | ICD-10-CM | POA: Diagnosis not present

## 2023-03-07 DIAGNOSIS — R0609 Other forms of dyspnea: Secondary | ICD-10-CM | POA: Diagnosis not present

## 2023-03-07 DIAGNOSIS — I5032 Chronic diastolic (congestive) heart failure: Secondary | ICD-10-CM | POA: Diagnosis not present

## 2023-03-07 MED ORDER — SPIRONOLACTONE 25 MG PO TABS: 12.5000 mg | ORAL_TABLET | Freq: Every day | ORAL | 3 refills | Status: DC

## 2023-03-07 NOTE — Patient Instructions (Addendum)
Medication Instructions:  Your physician has recommended you make the following change in your medication:  DECREASE: spironolactone to 12.5 mg by mouth once daily (1/2 a pill)  Please notify the office: if you develop swelling or weight gain of 3 pounds in 24 hours or 5 pounds in 1 week IF you start having funny heart beats IF you have issues with your BP  *If you need a refill on your cardiac medications before your next appointment, please call your pharmacy*   Lab Work: IN 2 WEEKS: BMP  If you have labs (blood work) drawn today and your tests are completely normal, you will receive your results only by: [place] (if you have MyChart) OR A paper copy in the mail If you have any lab test that is abnormal or we need to change your treatment, we will call you to review the results.   Testing/Procedures: NONE   Follow-Up:As scheduled At [facility], you and your health needs are our priority.  As part of our continuing mission to provide you with exceptional heart care, we have created designated Provider Care Teams.  These Care Teams include your primary Cardiologist (physician) and Advanced Practice Providers (APPs -  Physician Assistants and Nurse Practitioners) who all work together to provide you with the care you need, when you need it.     Provider:   Werner Lean, MD

## 2023-03-07 NOTE — Progress Notes (Signed)
Cardiology Office Note:    Date:  03/07/2023   ID:  [name], DOB 1941/02/16, MRN [id number]  PCP:  No primary care provider on file.  Provider Notes:  Hearing Impaired   [facility]  Cardiologist:  Christina Lean, MD  Advanced Practice Provider:  No care team member to display Electrophysiologist:  None      CC: Follow up PAD  History of Present Illness:    Christina Sellers is a 82 y.o. female with a hx of PAD (CAS), hx of COPD with Asthma, HLD with T2DM with aortic atherosclerosis, HTN with DM who presents for evaluation 01/21/21. 2022: Had syncope issues including syncope during an office visit.  Seen in the hospital during that admission and her diuretics and BB were stopped due to intolerance. Had right hip surgery 03/30/21.  Has done better of CCB and thiazide 2023: TR improved.  BP high.  Had issues with hydralazine and increased norvasc with Dr. Inda Merlin Dr. Inda Merlin retired.  Now sees Dr. Louis Matte  started low dose MRA.  2024: Started on SGLT2i.  Did not want to start MRA increase; creatinine increased ~ 20% with increase.  Patient notes that she is having some questions.   Patient notes that she is doing 7/10. Since last visit notes that she has lost some weight . There are no interval hospital/ED visit.    No chest pain or pressure .  No SOB/DOE and no PND/Orthopnea. She notes that she is urinating all the time.  It is affect her quality of life.  She notes rare palpitations.   Past Medical History:  Diagnosis Date   Abnormal Pap smear of vagina    Allergic sinusitis    Anxiety    Atherosclerotic peripheral vascular disease    Carotid artery stenosis    Cellulitis    COPD with asthma    not an issue now   Depression    Diabetes mellitus type 2, uncomplicated    DJD (degenerative joint disease)    Elevated cholesterol    Elevated MCV    secondary to alchol   GERD (gastroesophageal reflux disease)    not an issue now.   Gout     Hearing difficulty    bilateral hearing aids   History of recurrent UTIs    Hypertension    loss weight'off meds now"   Hypothyroidism    IBS (irritable bowel syndrome)    Insomnia    Labial fusion    Osteoarthritis of left knee    Osteopenia    Recurrent vaginitis    Sjogren's syndrome    Syncope 11.7.14   secondary to dehydration and possible hypoglycemia   Tendonitis    Thyroid disease    Transfusion history    age 27 "anemia"    Past Surgical History:  Procedure Laterality Date   CATARACT EXTRACTION, BILATERAL Bilateral    CHOLECYSTECTOMY     open   COLONOSCOPY WITH PROPOFOL N/A 03/23/2016   Procedure: COLONOSCOPY WITH PROPOFOL;  Surgeon: Garlan Fair, MD;  Location: [facility];  Service: Endoscopy;  Laterality: N/A;   EYE SURGERY     FOOT SURGERY Left    retained hardware   KNEE SURGERY Left    scope    TONSILLECTOMY     TOTAL HIP ARTHROPLASTY Right 03/30/2021   Procedure: RIGHTTOTAL HIP ARTHROPLASTY ANTERIOR APPROACH;  Surgeon: Frederik Pear, MD;  Location: [facility];  Service: Orthopedics;  Laterality: Right;    Current Medications: Current Meds  Medication Sig   acetaminophen (TYLENOL) 650 MG CR tablet 2 tablets in the morning and at bedtime.   amLODipine (NORVASC) 2.5 MG tablet Take 5 mg by mouth daily.   ascorbic acid (VITAMIN C) 500 MG tablet Take 500 mg by mouth daily.   cetirizine (ZYRTEC ALLERGY) 10 MG tablet as needed for allergies.   Cholecalciferol (VITAMIN D3) 50 MCG (2000 UT) capsule Take 2,000 mg by mouth daily.   clobetasol ointment (TEMOVATE) 0.05 % Apply topically 2 (two) times a week.   cloNIDine (CATAPRES) 0.1 MG tablet Take 0.2 mg by mouth at bedtime.   Cyanocobalamin (VITAMIN B12) 1000 MCG TBCR daily.   denosumab (PROLIA) 60 MG/ML SOSY injection Inject 60 mg into the skin every 6 (six) months.   diclofenac sodium (VOLTAREN) 1 % GEL Apply 1 application topically 2 (two) times daily as needed (to painful sites).   empagliflozin (JARDIANCE) 10  MG TABS tablet Take by mouth daily.   finasteride (PROSCAR) 5 MG tablet Take 2.5 mg by mouth daily.   fluticasone (FLONASE) 50 MCG/ACT nasal spray Place 1 spray into both nostrils daily as needed (for seasonal allergies).   furosemide (LASIX) 20 MG tablet Take 1 tablet (20 mg total) by mouth daily.   hydrocortisone 2.5 % cream Apply topically as needed.   hypromellose (SYSTANE OVERNIGHT THERAPY) 0.3 % GEL ophthalmic ointment Place 1 application into both eyes at bedtime as needed for dry eyes.   levothyroxine (SYNTHROID) 75 MCG tablet Take 75 mcg by mouth every morning.   meclizine (ANTIVERT) 25 MG tablet Take 25 mg by mouth as needed for dizziness.   meloxicam (MOBIC) 7.5 MG tablet Take by mouth daily as needed.   Multiple Vitamins-Minerals (MULTIVITAMIN WITH MINERALS) tablet Take 1 tablet by mouth daily.   nystatin cream (MYCOSTATIN) Apply 1 application topically 2 (two) times daily. (Patient taking differently: Apply 1 application  topically as needed for dry skin.)   OVER THE COUNTER MEDICATION Take 2 capsules by mouth daily. Hydro Eye   Polyvinyl Alcohol-Povidone PF (REFRESH) 1.4-0.6 % SOLN as needed.   Psyllium (METAMUCIL PO) Take 1 Scoop by mouth at bedtime as needed (constipation- mix and drink as directed).   RESTASIS 0.05 % ophthalmic emulsion Place 1 drop into both eyes 2 (two) times daily.   rosuvastatin (CRESTOR) 5 MG tablet Take 5 mg by mouth daily.   vitamin E 200 UNIT capsule Take 200 Units by mouth daily.   [DISCONTINUED] spironolactone (ALDACTONE) 25 MG tablet Take 1 tablet (25 mg total) by mouth daily.     Allergies:   Tape, Celexa [citalopram hydrobromide], Fluconazole, Green tea leaf ext, Hydralazine hcl, Kenalog [triamcinolone], Levaquin [levofloxacin], Losartan potassium-hctz, Magnesium citrate, Methocarbamol, Nitrofurantoin macrocrystal, Scopolamine, Sulfa antibiotics, Tea, and Trimethoprim   Social History   Socioeconomic History   Marital status: Widowed    Spouse  name: Not on file   Number of children: Not on file   Years of education: Not on file   Highest education level: Not on file  Occupational History   Not on file  Tobacco Use   Smoking status: Former    Types: Cigarettes    Quit date: 02/21/1982    Years since quitting: 41.0   Smokeless tobacco: Never  Vaping Use   Vaping Use: Never used  Substance and Sexual Activity   Alcohol use: No   Drug use: No   Sexual activity: Never    Birth control/protection: Post-menopausal  Other Topics Concern   Not on file  Social  History Narrative   Not on file   Social Determinants of Health   Financial Resource Strain: Not on file  Food Insecurity: Not on file  Transportation Needs: Not on file  Physical Activity: Not on file  Stress: Not on file  Social Connections: Not on file    Social:  Daughter is in [place] and had two kids' she has multiple family members one who graduated dental school (grandson in [place]); can't come see her grandkids because of her hip pain.[employer]  Family History: The patient's family history includes Breast cancer in her sister; Cancer in her brother and father; Diabetes in her maternal aunt.  ROS:   Please see the history of present illness.   All other systems reviewed and are negative.  EKGs/Labs/Other Studies Reviewed:    The following studies were reviewed today:  EKG:   03/24/22: SR 66 01/21/21: SR 65 WNL  Cardiac Studies & Procedures     STRESS TESTS  MYOCARDIAL PERFUSION IMAGING 04/14/2018  Narrative  Nuclear stress EF: 77%.  The left ventricular ejection fraction is hyperdynamic (>65%).  There was no ST segment deviation noted during stress.  The study is normal.  This is a low risk study.  Normal pharmacologic nuclear stress test with no evidence for prior infarct or ischemia. Hyperdynamic LVEF.   ECHOCARDIOGRAM  ECHOCARDIOGRAM COMPLETE 04/08/2022  Narrative ECHOCARDIOGRAM REPORT    Patient Name:   TOLULOPE WOOLBERT Date of Exam: 04/08/2022 Medical Rec #:  [id number]         Height:       58.0 in Accession #:    [id number]        Weight:       136.0 lb Date of Birth:  03-Feb-1941         BSA:          1.546 m Patient Age:    31 years          BP:           185/70 mmHg Patient Gender: F                 HR:           76 bpm. Exam Location:  [facility]  Procedure: 2D Echo, 3D Echo, Color Doppler, Cardiac Doppler and Strain Analysis  Indications:    R06.09 Dyspnea on exertion  History:        Patient has prior history of Echocardiogram examinations, most recent 02/02/2021. PVD and COPD, Signs/Symptoms:Orthostatic hypotension, Syncope and Shortness of Breath; Risk Factors:Hypertension, Diabetes, Sleep Apnea and Former Smoker.  Sonographer:    Basilia Jumbo [facility], RDCS Referring Phys: [id number] [employer] A [facility] [employer]  IMPRESSIONS   1. Left ventricular ejection fraction, by estimation, is 60 to 65%. The left ventricle has normal function. The left ventricle has no regional wall motion abnormalities. Left ventricular diastolic parameters are consistent with Grade I diastolic dysfunction (impaired relaxation). The average left ventricular global longitudinal strain is -24.5 %. The global longitudinal strain is normal. 2. Right ventricular systolic function is normal. The right ventricular size is normal. There is normal pulmonary artery systolic pressure. The estimated right ventricular systolic pressure is 123XX123 mmHg. 3. Left atrial size was moderately dilated. 4. The mitral valve is normal in structure. No evidence of mitral valve regurgitation. No evidence of mitral stenosis. 5. The aortic valve is tricuspid. Aortic valve regurgitation is not visualized. Aortic valve sclerosis/calcification is present, without any evidence of aortic stenosis. 6. The inferior vena  cava is normal in size with greater than 50% respiratory variability, suggesting right atrial pressure of 3 mmHg.  Comparison(s): 02/02/21  EF 65-70. Mild-moderate TR.  FINDINGS Left Ventricle: Left ventricular ejection fraction, by estimation, is 60 to 65%. The left ventricle has normal function. The left ventricle has no regional wall motion abnormalities. The average left ventricular global longitudinal strain is -24.5 %. The global longitudinal strain is normal. 3D left ventricular ejection fraction analysis performed but not reported based on interpreter judgement due to suboptimal tracking. The left ventricular internal cavity size was normal in size. There is no left ventricular hypertrophy. Left ventricular diastolic parameters are consistent with Grade I diastolic dysfunction (impaired relaxation). Normal left ventricular filling pressure.  Right Ventricle: The right ventricular size is normal. No increase in right ventricular wall thickness. Right ventricular systolic function is normal. There is normal pulmonary artery systolic pressure. The tricuspid regurgitant velocity is 2.79 m/s, and with an assumed right atrial pressure of 3 mmHg, the estimated right ventricular systolic pressure is 123XX123 mmHg.  Left Atrium: Left atrial size was moderately dilated.  Right Atrium: Right atrial size was normal in size.  Pericardium: There is no evidence of pericardial effusion.  Mitral Valve: The mitral valve is normal in structure. Mild to moderate mitral annular calcification. No evidence of mitral valve regurgitation. No evidence of mitral valve stenosis.  Tricuspid Valve: The tricuspid valve is normal in structure. Tricuspid valve regurgitation is trivial. No evidence of tricuspid stenosis.  Aortic Valve: The aortic valve is tricuspid. Aortic valve regurgitation is not visualized. Aortic valve sclerosis/calcification is present, without any evidence of aortic stenosis.  Pulmonic Valve: The pulmonic valve was normal in structure. Pulmonic valve regurgitation is not visualized. No evidence of pulmonic stenosis.  Aorta: The aortic  root is normal in size and structure.  Venous: The inferior vena cava is normal in size with greater than 50% respiratory variability, suggesting right atrial pressure of 3 mmHg.  IAS/Shunts: No atrial level shunt detected by color flow Doppler.   LEFT VENTRICLE PLAX 2D LVIDd:         4.40 cm   Diastology LVIDs:         2.90 cm   LV e' medial:    6.96 cm/s LV PW:         0.90 cm   LV E/e' medial:  9.7 LV IVS:        1.00 cm   LV e' lateral:   6.96 cm/s LVOT diam:     2.00 cm   LV E/e' lateral: 9.7 LV SV:         80 LV SV Index:   52        2D Longitudinal Strain LVOT Area:     3.14 cm  2D Strain GLS (A2C):   -23.4 % 2D Strain GLS (A3C):   -26.7 % 2D Strain GLS (A4C):   -23.4 % 2D Strain GLS Avg:     -24.5 %  3D Volume EF: 3D EF:        58 % LV EDV:       100 ml LV ESV:       42 ml LV SV:        58 ml  RIGHT VENTRICLE             IVC RV Basal diam:  3.50 cm     IVC diam: 1.80 cm RV S prime:     13.40 cm/s TAPSE (M-mode): 2.7 cm RVSP:  34.1 mmHg  LEFT ATRIUM             Index        RIGHT ATRIUM           Index LA diam:        4.00 cm 2.59 cm/m   RA Pressure: 3.00 mmHg LA Vol (A2C):   70.2 ml 45.41 ml/m  RA Area:     14.10 cm LA Vol (A4C):   57.0 ml 36.87 ml/m  RA Volume:   38.60 ml  24.97 ml/m LA Biplane Vol: 63.8 ml 41.27 ml/m AORTIC VALVE LVOT Vmax:   116.00 cm/s LVOT Vmean:  79.100 cm/s LVOT VTI:    0.256 m  AORTA Ao Root diam: 2.70 cm Ao Asc diam:  2.50 cm  MITRAL VALVE               TRICUSPID VALVE TR Peak grad:   31.1 mmHg MV Decel Time: 197 msec    TR Vmax:        279.00 cm/s MV E velocity: 67.60 cm/s  Estimated RAP:  3.00 mmHg MV A velocity: 96.90 cm/s  RVSP:           34.1 mmHg MV E/A ratio:  0.70 SHUNTS Systemic VTI:  0.26 m Systemic Diam: 2.00 cm  Fransico Him MD Electronically signed by Fransico Him MD Signature Date/Time: 04/08/2022/3:49:05 PM    Final    MONITORS  LONG TERM MONITOR (3-14 DAYS) 02/12/2023  Narrative    Patient had a minimum heart rate of 45 bpm, maximum heart rate of 154 bpm, and average heart rate of 72 bpm.   Predominant underlying rhythm was sinus rhythm.   Four short runs of SVT.   Isolated PACs were rare (<1.0%).   Isolated PVCs were rare (<1.0%).   Triggered and diary events associated with sinus rhythm and rarely PACs.  Rare, asymptomatic paroxsymal SVT.            Carotid Duplex: Date:02/14/21 Results: Summary:  Right Carotid: Velocities in the right ICA are consistent with a 40-59%                 stenosis.   Left Carotid: Velocities in the left ICA are consistent with a 1-39%  stenosis.   Vertebrals:  Right vertebral artery demonstrates antegrade flow. Left  vertebral               artery demonstrates bidirectional flow.  Subclavians: Normal flow hemodynamics were seen in bilateral subclavian               arteries.   LE Duplex Venous: Date: 02/15/21 Results: Summary:  BILATERAL:  - No evidence of deep vein thrombosis seen in the lower extremities,  bilaterally.  -No evidence of popliteal cyst, bilaterally.  RIGHT:  - No cystic structure found in the popliteal fossa.     LEFT:  - No cystic structure found in the popliteal fossa.    Recent Labs: 04/10/2022: B Natriuretic Peptide 249.4; Hemoglobin 11.5; Platelets 174 09/24/2022: NT-Pro BNP 749 02/28/2023: BUN 37; Creatinine, Ser 1.38; Potassium 4.2; Sodium 136  Recent Lipid Panel    Component Value Date/Time   CHOL 149 08/26/2011 0400   TRIG 195 (H) 08/26/2011 0400   HDL 37 (L) 08/26/2011 0400   CHOLHDL 4.0 08/26/2011 0400   VLDL 39 08/26/2011 0400   LDLCALC 73 08/26/2011 0400    Physical Exam:    VS:  BP (!) 148/58   Pulse 64  Ht 4\' 10"  (1.473 m)   Wt 133 lb (60.3 kg)   SpO2 98%   BMI 27.80 kg/m     Wt Readings from Last 3 Encounters:  03/07/23 133 lb (60.3 kg)  02/09/23 136 lb (61.7 kg)  01/21/23 134 lb (60.8 kg)    GEN:  Well nourished, well developed in no distress HEENT:  Normal CARDIAC: RRR systolic murmur RESPIRATORY:  Clear to auscultation without rales, wheezing or rhonchi  ABDOMEN: Soft, non-tender, non-distended MUSCULOSKELETAL:  +1 bilateral edema NEUROLOGIC:  Alert and oriented X3 PSYCHIATRIC:  Normal affect   ASSESSMENT:    1. Hypertension associated with diabetes   2. Chronic heart failure with preserved ejection fraction   3. Hyperlipidemia associated with type 2 diabetes mellitus   4. DOE (dyspnea on exertion)     PLAN:    Diastolic Dysfunction Diabetes with Hypertension Leg swelling with prior knee replacements Multi-factorial  - in the past had near-syncope with HCTZ and CCB (high dose); had leg swelling with hydralazine - on lasix 20 mg PO daily  - she does not wish to continue with the spironolactone, at 25 mg; we will decrease to 12.5 mg PO daily and recheck kidney function - we have discussed that with volume status and CKD that keeping her euvolemic will be more difficulty - continue SGLT2i - if worsening BP Referral to Dr. Oval Linsey for renal denervation eval  PAC P-SVT - asymptomatic now; she will defer BB unless worsening symptoms  HLD with DM Mild CAS on aspirin  Aortic atherosclerosis -LDL goal less than 70 at goal no change in therapy  Keep summer appointment   Time Spent Directly with Patient:   I have spent a total of 40 minutes with the patient reviewing notes, imaging, EKGs, labs and examining the patient as well as establishing an assessment and plan that was discussed personally with the patient.  > 50% of time was spent in direct patient care    Medication Adjustments/Labs and Tests Ordered: Current medicines are reviewed at length with the patient today.  Concerns regarding medicines are outlined above.  No orders of the defined types were placed in this encounter.   No orders of the defined types were placed in this encounter.    Patient Instructions  Medication Instructions:  Your physician has  recommended you make the following change in your medication:  DECREASE: spironolactone to 12.5 mg by mouth once daily  Please notify the office: if you develop swelling or weight gain of 3 pounds in 24 hours or 5 pounds in 1 week IF you start having funny heart beats IF you have issues with your BP  *If you need a refill on your cardiac medications before your next appointment, please call your pharmacy*   Lab Work: IN 2 WEEKS: BMP  If you have labs (blood work) drawn today and your tests are completely normal, you will receive your results only by: [place] (if you have MyChart) OR A paper copy in the mail If you have any lab test that is abnormal or we need to change your treatment, we will call you to review the results.   Testing/Procedures: NONE   Follow-Up:As scheduled At [facility], you and your health needs are our priority.  As part of our continuing mission to provide you with exceptional heart care, we have created designated Provider Care Teams.  These Care Teams include your primary Cardiologist (physician) and Advanced Practice Providers (APPs -  Physician Assistants and Nurse  Practitioners) who all work together to provide you with the care you need, when you need it.     Provider:   Werner Lean, MD       Signed, Christina Lean, MD  03/07/2023 2:24 PM    [place]

## 2023-03-08 DIAGNOSIS — M810 Age-related osteoporosis without current pathological fracture: Secondary | ICD-10-CM | POA: Diagnosis not present

## 2023-03-21 ENCOUNTER — Ambulatory Visit: Payer: Medicare Other | Attending: Internal Medicine

## 2023-03-21 DIAGNOSIS — I152 Hypertension secondary to endocrine disorders: Secondary | ICD-10-CM | POA: Diagnosis not present

## 2023-03-21 DIAGNOSIS — I5032 Chronic diastolic (congestive) heart failure: Secondary | ICD-10-CM

## 2023-03-21 DIAGNOSIS — E1159 Type 2 diabetes mellitus with other circulatory complications: Secondary | ICD-10-CM | POA: Diagnosis not present

## 2023-03-22 LAB — BASIC METABOLIC PANEL WITH GFR
BUN/Creatinine Ratio: 28 (ref 12–28)
BUN: 33 mg/dL — ABNORMAL HIGH (ref 8–27)
CO2: 20 mmol/L (ref 20–29)
Calcium: 9.8 mg/dL (ref 8.7–10.3)
Chloride: 103 mmol/L (ref 96–106)
Creatinine, Ser: 1.19 mg/dL — ABNORMAL HIGH (ref 0.57–1.00)
Glucose: 97 mg/dL (ref 70–99)
Potassium: 3.7 mmol/L (ref 3.5–5.2)
Sodium: 140 mmol/L (ref 134–144)
eGFR: 45 mL/min/{1.73_m2} — ABNORMAL LOW

## 2023-03-25 DIAGNOSIS — E113212 Type 2 diabetes mellitus with mild nonproliferative diabetic retinopathy with macular edema, left eye: Secondary | ICD-10-CM | POA: Diagnosis not present

## 2023-03-25 DIAGNOSIS — H3563 Retinal hemorrhage, bilateral: Secondary | ICD-10-CM | POA: Diagnosis not present

## 2023-03-25 DIAGNOSIS — H353131 Nonexudative age-related macular degeneration, bilateral, early dry stage: Secondary | ICD-10-CM | POA: Diagnosis not present

## 2023-03-25 DIAGNOSIS — H04123 Dry eye syndrome of bilateral lacrimal glands: Secondary | ICD-10-CM | POA: Diagnosis not present

## 2023-03-25 DIAGNOSIS — H524 Presbyopia: Secondary | ICD-10-CM | POA: Diagnosis not present

## 2023-03-30 DIAGNOSIS — R140 Abdominal distension (gaseous): Secondary | ICD-10-CM | POA: Diagnosis not present

## 2023-03-30 DIAGNOSIS — K5909 Other constipation: Secondary | ICD-10-CM | POA: Diagnosis not present

## 2023-03-30 DIAGNOSIS — I10 Essential (primary) hypertension: Secondary | ICD-10-CM | POA: Diagnosis not present

## 2023-04-04 DIAGNOSIS — Z1231 Encounter for screening mammogram for malignant neoplasm of breast: Secondary | ICD-10-CM | POA: Diagnosis not present

## 2023-04-14 ENCOUNTER — Encounter (HOSPITAL_COMMUNITY): Payer: Self-pay

## 2023-04-14 ENCOUNTER — Emergency Department (HOSPITAL_COMMUNITY)
Admission: EM | Admit: 2023-04-14 | Discharge: 2023-04-14 | Disposition: A | Discharge status: Home / Self Care | Payer: Medicare Other | Attending: Emergency Medicine | Admitting: Emergency Medicine

## 2023-04-14 ENCOUNTER — Emergency Department (HOSPITAL_COMMUNITY): Payer: Medicare Other

## 2023-04-14 DIAGNOSIS — R002 Palpitations: Secondary | ICD-10-CM | POA: Insufficient documentation

## 2023-04-14 DIAGNOSIS — I130 Hypertensive heart and chronic kidney disease with heart failure and stage 1 through stage 4 chronic kidney disease, or unspecified chronic kidney disease: Secondary | ICD-10-CM | POA: Diagnosis not present

## 2023-04-14 DIAGNOSIS — N289 Disorder of kidney and ureter, unspecified: Secondary | ICD-10-CM | POA: Diagnosis not present

## 2023-04-14 DIAGNOSIS — N183 Chronic kidney disease, stage 3 unspecified: Secondary | ICD-10-CM | POA: Diagnosis not present

## 2023-04-14 DIAGNOSIS — Z79890 Hormone replacement therapy (postmenopausal): Secondary | ICD-10-CM | POA: Insufficient documentation

## 2023-04-14 DIAGNOSIS — Z7951 Long term (current) use of inhaled steroids: Secondary | ICD-10-CM | POA: Diagnosis not present

## 2023-04-14 DIAGNOSIS — E1122 Type 2 diabetes mellitus with diabetic chronic kidney disease: Secondary | ICD-10-CM | POA: Diagnosis not present

## 2023-04-14 DIAGNOSIS — I2510 Atherosclerotic heart disease of native coronary artery without angina pectoris: Secondary | ICD-10-CM | POA: Insufficient documentation

## 2023-04-14 DIAGNOSIS — Z79899 Other long term (current) drug therapy: Secondary | ICD-10-CM | POA: Diagnosis not present

## 2023-04-14 DIAGNOSIS — E876 Hypokalemia: Secondary | ICD-10-CM | POA: Insufficient documentation

## 2023-04-14 DIAGNOSIS — Z7984 Long term (current) use of oral hypoglycemic drugs: Secondary | ICD-10-CM | POA: Diagnosis not present

## 2023-04-14 DIAGNOSIS — I5032 Chronic diastolic (congestive) heart failure: Secondary | ICD-10-CM | POA: Insufficient documentation

## 2023-04-14 DIAGNOSIS — R6889 Other general symptoms and signs: Secondary | ICD-10-CM | POA: Diagnosis not present

## 2023-04-14 DIAGNOSIS — J449 Chronic obstructive pulmonary disease, unspecified: Secondary | ICD-10-CM | POA: Diagnosis not present

## 2023-04-14 DIAGNOSIS — Z96641 Presence of right artificial hip joint: Secondary | ICD-10-CM | POA: Insufficient documentation

## 2023-04-14 DIAGNOSIS — E039 Hypothyroidism, unspecified: Secondary | ICD-10-CM | POA: Insufficient documentation

## 2023-04-14 DIAGNOSIS — I499 Cardiac arrhythmia, unspecified: Secondary | ICD-10-CM | POA: Diagnosis not present

## 2023-04-14 LAB — CBC WITH DIFFERENTIAL/PLATELET
Abs Immature Granulocytes: 0.03 10*3/uL (ref 0.00–0.07)
Basophils Absolute: 0.1 10*3/uL (ref 0.0–0.1)
Basophils Relative: 1 %
Eosinophils Absolute: 0.1 10*3/uL (ref 0.0–0.5)
Eosinophils Relative: 1 %
HCT: 39.7 % (ref 36.0–46.0)
Hemoglobin: 13.2 g/dL (ref 12.0–15.0)
Immature Granulocytes: 0 %
Lymphocytes Relative: 19 %
Lymphs Abs: 1.9 10*3/uL (ref 0.7–4.0)
MCH: 33.3 pg (ref 26.0–34.0)
MCHC: 33.2 g/dL (ref 30.0–36.0)
MCV: 100.3 fL — ABNORMAL HIGH (ref 80.0–100.0)
Monocytes Absolute: 1 10*3/uL (ref 0.1–1.0)
Monocytes Relative: 10 %
Neutro Abs: 6.6 10*3/uL (ref 1.7–7.7)
Neutrophils Relative %: 69 %
Platelets: 177 10*3/uL (ref 150–400)
RBC: 3.96 MIL/uL (ref 3.87–5.11)
RDW: 13.5 % (ref 11.5–15.5)
WBC: 9.6 10*3/uL (ref 4.0–10.5)
nRBC: 0 % (ref 0.0–0.2)

## 2023-04-14 LAB — COMPREHENSIVE METABOLIC PANEL
ALT: 17 U/L (ref 0–44)
AST: 30 U/L (ref 15–41)
Albumin: 4.3 g/dL (ref 3.5–5.0)
Alkaline Phosphatase: 70 U/L (ref 38–126)
Anion gap: 15 (ref 5–15)
BUN: 49 mg/dL — ABNORMAL HIGH (ref 8–23)
CO2: 19 mmol/L — ABNORMAL LOW (ref 22–32)
Calcium: 9.4 mg/dL (ref 8.9–10.3)
Chloride: 102 mmol/L (ref 98–111)
Creatinine, Ser: 1.26 mg/dL — ABNORMAL HIGH (ref 0.44–1.00)
GFR, Estimated: 42 mL/min — ABNORMAL LOW (ref >60)
Glucose, Bld: 135 mg/dL — ABNORMAL HIGH (ref 70–99)
Potassium: 3.2 mmol/L — ABNORMAL LOW (ref 3.5–5.1)
Sodium: 136 mmol/L (ref 135–145)
Total Bilirubin: 0.9 mg/dL (ref 0.3–1.2)
Total Protein: 7.1 g/dL (ref 6.5–8.1)

## 2023-04-14 LAB — MAGNESIUM: Magnesium: 2.3 mg/dL (ref 1.7–2.4)

## 2023-04-14 LAB — BRAIN NATRIURETIC PEPTIDE: B Natriuretic Peptide: 98.1 pg/mL (ref 0.0–100.0)

## 2023-04-14 MED ORDER — POTASSIUM CHLORIDE ER 10 MEQ PO TBCR: 10.0000 meq | EXTENDED_RELEASE_TABLET | Freq: Every day | ORAL | 0 refills | Status: DC

## 2023-04-14 MED ORDER — POTASSIUM CHLORIDE CRYS ER 20 MEQ PO TBCR
40.0000 meq | EXTENDED_RELEASE_TABLET | Freq: Once | ORAL | Status: AC
  Administered 2023-04-14: 40 meq via ORAL
  Filled 2023-04-14: qty 2

## 2023-04-14 MED ORDER — SODIUM CHLORIDE 0.9 % IV BOLUS
250.0000 mL | Freq: Once | INTRAVENOUS | Status: AC
  Administered 2023-04-14: 250 mL via INTRAVENOUS

## 2023-04-14 NOTE — ED Triage Notes (Signed)
Pt BIB [facility] with multiple complaints. Pt initially called because she was having palpitations. Pt reported to EMS that her stomach was upset and felt like her gait felt unsteady.  Initial BP 212/90 with EMS. Pt reports compliance with all medications.

## 2023-04-14 NOTE — ED Provider Notes (Addendum)
[place] EMERGENCY DEPARTMENT AT [facility] Provider Note  CSN: [id number] Arrival date & time: 04/14/23 0354  Chief Complaint(s) Palpitations and Bloated  HPI Christina Sellers is a 82 y.o. female with a past medical history listed below including hypertension, diabetes, Sjogren's here for palpitations.  Patient reports that she got up to use the restroom this evening and noted that her heart was thumping hard and fast.  This lasted for several minutes prompting a call to EMS.  Patient also reported having abdominal bloating over the past couple days.  She denied any associated chest pain or shortness of breath.  She denies any recent fevers or infections.  No coughing or congestion.  No nausea or vomiting.  No diarrhea.  Patient is on diuretics including Lasix and spironolactone.  And route with EMS, they noted patient had infrequent PVCs.  The history is provided by the patient and the EMS personnel.    Past Medical History Past Medical History:  Diagnosis Date   Abnormal Pap smear of vagina    Allergic sinusitis    Anxiety    Atherosclerotic peripheral vascular disease (HCC)    Carotid artery stenosis    Cellulitis    COPD with asthma    not an issue now   Depression    Diabetes mellitus type 2, uncomplicated (HCC)    DJD (degenerative joint disease)    Elevated cholesterol    Elevated MCV    secondary to alchol   GERD (gastroesophageal reflux disease)    not an issue now.   Gout    Hearing difficulty    bilateral hearing aids   History of recurrent UTIs    Hypertension    loss weight'off meds now"   Hypothyroidism    IBS (irritable bowel syndrome)    Insomnia    Labial fusion    Osteoarthritis of left knee    Osteopenia    Recurrent vaginitis    Sjogren's syndrome (HCC)    Syncope 11.7.14   secondary to dehydration and possible hypoglycemia   Tendonitis    Thyroid disease    Transfusion history    age 61 "anemia"   Patient Active Problem List    Diagnosis Date Noted   Bilateral carotid artery stenosis 01/21/2023   Palpitations 09/24/2022   Nonrheumatic tricuspid valve regurgitation 03/24/2022   Abnormal finding of blood chemistry, unspecified 05/13/2021   Acquired hallux valgus 05/13/2021   Age-related osteoporosis without current pathological fracture 05/13/2021   Allergic rhinitis 05/13/2021   Anxiety disorder 05/13/2021   Aortic valve disorder 05/13/2021   Apnea 05/13/2021   Avascular necrosis of bone of hip (HCC) 05/13/2021   Benign paroxysmal positional vertigo 05/13/2021   Bilateral tinnitus 05/13/2021   Bronchospasm 05/13/2021   Chronic kidney disease, stage 3 unspecified (HCC) 05/13/2021   Chronic obstructive pulmonary disease, unspecified (HCC) 05/13/2021   Constipation 05/13/2021   Costochondritis 05/13/2021   Disequilibrium 05/13/2021   Dysphagia 05/13/2021   DOE (dyspnea on exertion) 05/13/2021   Elevated blood-pressure reading, without diagnosis of hypertension 05/13/2021   Fatigue 05/13/2021   Gastro-esophageal reflux disease without esophagitis 05/13/2021   Generalized hyperhidrosis 05/13/2021   Gout 05/13/2021   Hair loss disorder 05/13/2021   Pain in limb 05/13/2021   Hematochezia 05/13/2021   Hemorrhage of colon due to diverticulosis 05/13/2021   Hypersomnia 05/13/2021   Indigestion 05/13/2021   Labyrinthitis of both ears 05/13/2021   Left flank pain 05/13/2021   Leukocytosis 05/13/2021   Lichen planus 05/13/2021   Localized  swelling, mass and lump, neck 05/13/2021   Major depression, single episode 05/13/2021   Memory impairment 05/13/2021   Myalgia 05/13/2021   Night sweats 05/13/2021   Noninflammatory disorder of vagina 05/13/2021   Obstructive sleep apnea syndrome 05/13/2021   Osteoarthritis of knee 05/13/2021   Other long term (current) drug therapy 05/13/2021   Pharyngeal dysphagia 05/13/2021   Pressure in chest 05/13/2021   Sciatica 05/13/2021   Sjogren's syndrome (HCC) 05/13/2021    Sleep disorder 05/13/2021   Status post hip replacement 05/13/2021   Superficial phlebitis 05/13/2021   Tear film insufficiency 05/13/2021   Urinary tract infectious disease 05/13/2021   Urticaria 05/13/2021   Ventral hernia without obstruction or gangrene 05/13/2021   Vitamin D deficiency 05/13/2021   H/O total hip arthroplasty, right 03/30/2021   Osteoarthritis of right hip 03/25/2021   Orthostatic hypotension 02/27/2021   History of chest pain 02/11/2021   IBS (irritable bowel syndrome) 02/11/2021   Hyponatremia 02/11/2021   Acute cystitis without hematuria    Chronic back pain    Syncope 02/01/2021   E-coli UTI 02/01/2021   Dehydration with hyponatremia 02/01/2021   Nausea and vomiting 02/01/2021   Acute diarrhea 02/01/2021   Chronic heart failure with preserved ejection fraction (HCC) 02/01/2021   Metabolic acidosis 02/01/2021   Hypertension associated with diabetes (HCC) 01/21/2021   Hyperlipidemia associated with type 2 diabetes mellitus (HCC) 01/21/2021   Aortic atherosclerosis (HCC) 01/21/2021   Chronic low back pain 10/05/2019   Type 2 diabetes mellitus with hyperglycemia, without long-term current use of insulin (HCC) 02/21/2019   Hypothyroidism 02/21/2019   Pain of left hip joint 12/08/2018   Dermatochalasis of both eyelids 07/04/2018   Viral labyrinthitis 01/13/2017   Acquired spondylolisthesis 04/30/2014   Degeneration of lumbar intervertebral disc 04/30/2014   Atherosclerotic peripheral vascular disease (HCC)    Osteopenia 11/22/2013   Labial fusion    Home Medication(s) Prior to Admission medications   Medication Sig Start Date End Date Taking? Authorizing Provider  acetaminophen (TYLENOL) 650 MG CR tablet 2 tablets in the morning and at bedtime.    [provider]  amLODipine (NORVASC) 2.5 MG tablet Take 5 mg by mouth daily. 04/13/22   Marden Noble, MD  ascorbic acid (VITAMIN C) 500 MG tablet Take 500 mg by mouth daily.    [provider]   cetirizine (ZYRTEC ALLERGY) 10 MG tablet as needed for allergies.    [provider]  Cholecalciferol (VITAMIN D3) 50 MCG (2000 UT) capsule Take 2,000 mg by mouth daily.    [provider]  clobetasol ointment (TEMOVATE) 0.05 % Apply topically 2 (two) times a week. 02/10/23   Wyline Beady A, NP  cloNIDine (CATAPRES) 0.1 MG tablet Take 0.2 mg by mouth at bedtime. 08/25/22   [provider]  Cyanocobalamin (VITAMIN B12) 1000 MCG TBCR daily.    [provider]  denosumab (PROLIA) 60 MG/ML SOSY injection Inject 60 mg into the skin every 6 (six) months.    [provider]  diclofenac sodium (VOLTAREN) 1 % GEL Apply 1 application topically 2 (two) times daily as needed (to painful sites). 03/29/19   [provider]  empagliflozin (JARDIANCE) 10 MG TABS tablet Take by mouth daily.    [provider]  finasteride (PROSCAR) 5 MG tablet Take 2.5 mg by mouth daily. 02/19/21   [provider]  fluticasone (FLONASE) 50 MCG/ACT nasal spray Place 1 spray into both nostrils daily as needed (for seasonal allergies).    [provider]  furosemide (LASIX) 20 MG tablet Take 1 tablet (20 mg total) by mouth daily. 09/28/22   Chandrasekhar, [place] Dragon A, MD  hydrocortisone 2.5 % cream Apply topically as needed. 01/27/22   [provider]  hypromellose (SYSTANE OVERNIGHT THERAPY) 0.3 % GEL ophthalmic ointment Place 1 application into both eyes at bedtime as needed for dry eyes.    [provider]  levothyroxine (SYNTHROID) 75 MCG tablet Take 75 mcg by mouth every morning. 07/15/21   [provider]  meclizine (ANTIVERT) 25 MG tablet Take 25 mg by mouth as needed for dizziness. 11/30/22   [provider]  meloxicam (MOBIC) 7.5 MG tablet Take by mouth daily as needed. 07/13/22   [provider]  Multiple Vitamins-Minerals (MULTIVITAMIN WITH MINERALS) tablet Take 1 tablet by mouth daily.    [provider]  nystatin cream (MYCOSTATIN) Apply 1 application topically 2 (two) times daily. Patient taking differently: Apply 1 application  topically as needed for dry skin. 04/28/21   Olivia Mackie, NP  OVER THE COUNTER MEDICATION Take 2 capsules by mouth daily. [facility]    [provider]  Polyvinyl Alcohol-Povidone PF (REFRESH) 1.4-0.6 % SOLN as needed.    [provider]  potassium chloride (KLOR-CON) 10 MEQ tablet Take 1 tablet (10 mEq total) by mouth daily for 3 days. 04/16/23 04/19/23  Nira Conn, MD  Psyllium (METAMUCIL PO) Take 1 Scoop by mouth at bedtime as needed (constipation- mix and drink as directed).    [provider]  RESTASIS 0.05 % ophthalmic emulsion Place 1 drop into both eyes 2 (two) times daily.    [provider]  rosuvastatin (CRESTOR) 5 MG tablet Take 5 mg by mouth daily.    [provider]  spironolactone (ALDACTONE) 25 MG tablet Take 0.5 tablets (12.5 mg total) by mouth daily. 03/07/23   [name], MD  vitamin E 200 UNIT capsule Take 200 Units by mouth daily.    [provider]                                                                                                                                    Allergies Tape, Celexa [citalopram hydrobromide], Fluconazole, Green tea leaf ext, Hydralazine hcl, Kenalog [triamcinolone], Levaquin [levofloxacin], Losartan potassium-hctz, Magnesium citrate, Methocarbamol, Nitrofurantoin macrocrystal, Scopolamine, Sulfa antibiotics, Tea, and Trimethoprim  Review of Systems Review of Systems As noted in HPI  Physical Exam Vital Signs  I have reviewed the triage vital signs BP (!) 182/64   Pulse 84   Temp 97.7 F (36.5 C) (Oral)   Resp 11   SpO2 98%   Physical Exam Vitals reviewed.  Constitutional:      General: She is not in acute distress.    Appearance: She is well-developed. She is not diaphoretic.  HENT:     Head: Normocephalic  and atraumatic.     Nose: Nose normal.  Eyes:     General: No scleral icterus.       Right eye: No discharge.        Left eye: No discharge.     Conjunctiva/sclera: Conjunctivae normal.     Pupils: Pupils are equal, round, and reactive to light.  Cardiovascular:     Rate and Rhythm: Normal rate and regular rhythm.     Heart sounds: No murmur heard.    No friction rub. No gallop.  Pulmonary:     Effort: Pulmonary effort is normal. No respiratory distress.     Breath sounds: Normal breath sounds. No stridor. No rales.  Abdominal:     General: There is no distension.     Palpations: Abdomen is soft.     Tenderness: There is no abdominal tenderness.  Musculoskeletal:        General: No tenderness.     Cervical back: Normal range of motion and neck supple.     Right lower leg: No edema.     Left lower leg: No edema.  Skin:    General: Skin is warm and dry.     Findings: No erythema or rash.  Neurological:     Mental Status: She is alert and oriented to person, place, and time.     ED Results and Treatments Labs (all labs ordered are listed, but only abnormal results are displayed) Labs Reviewed  CBC WITH DIFFERENTIAL/PLATELET - Abnormal; Notable for the following components:      Result Value   MCV 100.3 (*)    All other components within normal limits  COMPREHENSIVE METABOLIC PANEL - Abnormal; Notable for the following components:   Potassium 3.2 (*)    CO2 19 (*)    Glucose, Bld 135 (*)    BUN 49 (*)    Creatinine, Ser 1.26 (*)    GFR, Estimated 42 (*)    All other components within normal limits  BRAIN NATRIURETIC PEPTIDE  MAGNESIUM                                                                                                                         EKG  EKG Interpretation  Date/Time:  Thursday Apr 14 2023 06:24:27 EDT Ventricular Rate:  85 PR Interval:  233 QRS Duration: 103 QT Interval:  419 QTC Calculation: 499 R Axis:   -35 Text Interpretation: Sinus  rhythm Prolonged PR interval Inferior infarct, old Consider anterior infarct No significant change was found Confirmed by Drema Pry [phone]) on 04/14/2023 6:35:22 AM       Radiology DG Chest Port 1 View  Result Date: 04/14/2023 CLINICAL DATA:  Palpitations EXAM: PORTABLE CHEST 1 VIEW COMPARISON:  09/12/2022 FINDINGS: Chronic borderline cardiomegaly. Normal mediastinal contours. No acute infiltrate or edema. No effusion or pneumothorax. No acute osseous findings. IMPRESSION: No evidence of active disease. Electronically Signed   By: Tiburcio Pea M.D.   On: 04/14/2023 04:55    Medications Ordered in ED Medications  potassium chloride SA (  KLOR-CON M) CR tablet 40 mEq (40 mEq Oral Given 04/14/23 0650)  sodium chloride 0.9 % bolus 250 mL (250 mLs Intravenous New Bag/Given 04/14/23 0707)                                                                                                                                     Procedures Procedures  (including critical care time)  Medical Decision Making / ED Course  Click here for ABCD2, HEART and other calculators  Medical Decision Making Amount and/or Complexity of Data Reviewed Labs: ordered. Radiology: ordered.  Risk Prescription drug management.    This patient presents to the ED for concern of heart palpitations, this involves an extensive number of treatment options, and is a complaint that carries with it a high risk of complications and morbidity. The differential diagnosis includes but not limited to:  Dysrhythmias.  Will assess for any electrolyte derangements given her use of diuretics.  Will check for renal function. Will assess for anemia. Will also assess for evidence of heart failure. Doubt pulmonary embolism given the resolved symptoms without chest pain.  Doubt ACS.   Initial intervention:  N/A  Work up Interpretation and Management:  Cardiac Monitoring/EKG: Telemetry with normal sinus rhythm, no dysrhythmias or  blocks. EKG without acute ischemic changes, dysrhythmias or blocks.  Laboratory Tests ordered listed below with my independent interpretation: CBC without leukocytosis or anemia CMP with mild hypokalemia at 3.2.  No other significant electrolyte derangements.  Mild renal insufficiency without AKI. BNP within normal limits   Imaging Studies ordered listed below with my independent interpretation: Chest x-ray without evidence of pneumonia, pneumothorax, pulmonary edema pleural effusions    Reassessment: Patient remained hemodynamically stable without any recurrence of her symptoms. She has been up and walking several times w/o issue. She was given oral dose of potassium repletion and small IV fluid bolus. Will recommend short course of potassium repletion and close follow-up with PCP     Final Clinical Impression(s) / ED Diagnoses Final diagnoses:  Palpitations  Hypokalemia   The patient appears reasonably screened and/or stabilized for discharge and I doubt any other medical condition or other [facility] requiring further screening, evaluation, or treatment in the ED at this time. I have discussed the findings, Dx and Tx plan with the patient/family who expressed understanding and agree(s) with the plan. Discharge instructions discussed at length. The patient/family was given strict return precautions who verbalized understanding of the instructions. No further questions at time of discharge.  Disposition: Discharge  Condition: Good  ED Discharge Orders          Ordered    potassium chloride (KLOR-CON) 10 MEQ tablet  Daily        04/14/23 0724              Follow Up: Thana Ates, MD [address] [place] [address] [phone]  Call  to schedule  an appointment for close follow up           This chart was dictated using voice recognition software.  Despite best efforts to proofread,  errors can occur which can change the documentation  meaning.      Nira Conn, MD 04/14/23 [phone]

## 2023-04-15 DIAGNOSIS — R002 Palpitations: Secondary | ICD-10-CM | POA: Diagnosis not present

## 2023-04-18 ENCOUNTER — Ambulatory Visit: Payer: Medicare Other | Attending: Internal Medicine | Admitting: Internal Medicine

## 2023-04-18 ENCOUNTER — Encounter: Payer: Self-pay | Admitting: Internal Medicine

## 2023-04-18 VITALS — BP 184/72 | HR 68 | Ht <= 58 in | Wt 129.0 lb

## 2023-04-18 DIAGNOSIS — I152 Hypertension secondary to endocrine disorders: Secondary | ICD-10-CM

## 2023-04-18 DIAGNOSIS — E1159 Type 2 diabetes mellitus with other circulatory complications: Secondary | ICD-10-CM

## 2023-04-18 DIAGNOSIS — I5032 Chronic diastolic (congestive) heart failure: Secondary | ICD-10-CM

## 2023-04-18 DIAGNOSIS — R002 Palpitations: Secondary | ICD-10-CM | POA: Diagnosis not present

## 2023-04-18 MED ORDER — FUROSEMIDE 20 MG PO TABS: 20.0000 mg | ORAL_TABLET | ORAL | 3 refills | Status: DC

## 2023-04-18 NOTE — Progress Notes (Signed)
Cardiology Office Note:    Date:  04/18/2023   ID:  Christina Sellers, DOB [birth year], MRN [id number]  PCP:  Thana Ates, MD  Provider Notes:  Hearing Impaired   [facility]  Cardiologist:  [name], MD  Advanced Practice Provider:  No care team member to display Electrophysiologist:  None      CC: Follow up palpitations  History of Present Illness:    Christina Sellers is a 82 y.o. female with a hx of PAD (CAS), hx of COPD with Asthma, HLD with T2DM with aortic atherosclerosis, HTN with DM who presents for evaluation 01/21/21. 2022: Had syncope issues including syncope during an office visit.  Seen in the hospital during that admission and her diuretics and BB were stopped due to intolerance. Had right hip surgery 03/30/21.  Has done better of CCB and thiazide 2023: TR improved.  BP high.  Had issues with hydralazine and increased norvasc with Dr. Kevan Ny Dr. Kevan Ny retired.  Now sees Dr. Margaretann Loveless  started low dose MRA.  2024: Started on SGLT2i.  Did not want to start MRA increase; creatinine increased ~ 20% with increase.  Patient notes that she is having some questions.   Patient notes that she has had a return to the ED for palpitations, elevated BP, and low K.   Since starting on K her symptoms has resolved.  No chest pain or pressure. No SOB/DOE.  No PND/Orthopnea.  No weight gain or leg swelling. She notes that her diuretic therapy has negative affected her quality of life.  She notes that her daughter will be driving her to her granddaughter graduation because of frequent diuresis. She notes palpitations has resolves  We have discussed high blood pressure and therapies.  That have had adverse effects.  She is very concerned with pill burden.  She does not feel she can take daily diuretic.  She is confident her BP spike is related to her foot pain.   Past Medical History:  Diagnosis Date   Abnormal Pap smear of vagina    Allergic sinusitis     Anxiety    Atherosclerotic peripheral vascular disease (HCC)    Carotid artery stenosis    Cellulitis    COPD with asthma    not an issue now   Depression    Diabetes mellitus type 2, uncomplicated (HCC)    DJD (degenerative joint disease)    Elevated cholesterol    Elevated MCV    secondary to alchol   GERD (gastroesophageal reflux disease)    not an issue now.   Gout    Hearing difficulty    bilateral hearing aids   History of recurrent UTIs    Hypertension    loss weight'off meds now"   Hypothyroidism    IBS (irritable bowel syndrome)    Insomnia    Labial fusion    Osteoarthritis of left knee    Osteopenia    Recurrent vaginitis    Sjogren's syndrome (HCC)    Syncope 11.7.14   secondary to dehydration and possible hypoglycemia   Tendonitis    Thyroid disease    Transfusion history    age 52 "anemia"    Past Surgical History:  Procedure Laterality Date   CATARACT EXTRACTION, BILATERAL Bilateral    CHOLECYSTECTOMY     open   COLONOSCOPY WITH PROPOFOL N/A 03/23/2016   Procedure: COLONOSCOPY WITH PROPOFOL;  Surgeon: Charolett Bumpers, MD;  Location: [facility];  Service: Endoscopy;  Laterality: N/A;  EYE SURGERY     FOOT SURGERY Left    retained hardware   KNEE SURGERY Left    scope    TONSILLECTOMY     TOTAL HIP ARTHROPLASTY Right 03/30/2021   Procedure: RIGHTTOTAL HIP ARTHROPLASTY ANTERIOR APPROACH;  Surgeon: Gean Birchwood, MD;  Location: [facility];  Service: Orthopedics;  Laterality: Right;    Current Medications: Current Meds  Medication Sig   acetaminophen (TYLENOL) 650 MG CR tablet 2 tablets in the morning and at bedtime.   amLODipine (NORVASC) 2.5 MG tablet Take 5 mg by mouth daily.   ascorbic acid (VITAMIN C) 500 MG tablet Take 500 mg by mouth daily.   cetirizine (ZYRTEC ALLERGY) 10 MG tablet as needed for allergies.   Cholecalciferol (VITAMIN D3) 50 MCG (2000 UT) capsule Take 2,000 mg by mouth daily.   clobetasol ointment (TEMOVATE) 0.05 % Apply  topically 2 (two) times a week.   cloNIDine (CATAPRES) 0.1 MG tablet Take 0.2 mg by mouth at bedtime.   Cyanocobalamin (VITAMIN B12) 1000 MCG TBCR daily.   denosumab (PROLIA) 60 MG/ML SOSY injection Inject 60 mg into the skin every 6 (six) months.   diclofenac sodium (VOLTAREN) 1 % GEL Apply 1 application topically 2 (two) times daily as needed (to painful sites).   empagliflozin (JARDIANCE) 10 MG TABS tablet Take by mouth daily.   finasteride (PROSCAR) 5 MG tablet Take 2.5 mg by mouth daily.   fluticasone (FLONASE) 50 MCG/ACT nasal spray Place 1 spray into both nostrils daily as needed (for seasonal allergies).   furosemide (LASIX) 20 MG tablet Take 1 tablet (20 mg total) by mouth every other day.   hydrocortisone 2.5 % cream Apply topically as needed.   hypromellose (SYSTANE OVERNIGHT THERAPY) 0.3 % GEL ophthalmic ointment Place 1 application into both eyes at bedtime as needed for dry eyes.   levothyroxine (SYNTHROID) 75 MCG tablet Take 75 mcg by mouth every morning.   meclizine (ANTIVERT) 25 MG tablet Take 25 mg by mouth as needed for dizziness.   meloxicam (MOBIC) 7.5 MG tablet Take by mouth daily as needed.   Multiple Vitamins-Minerals (MULTIVITAMIN WITH MINERALS) tablet Take 1 tablet by mouth daily.   nystatin cream (MYCOSTATIN) Apply 1 application topically 2 (two) times daily. (Patient taking differently: Apply 1 application  topically as needed for dry skin.)   OVER THE COUNTER MEDICATION Take 2 capsules by mouth daily. Hydro Eye   Polyvinyl Alcohol-Povidone PF (REFRESH) 1.4-0.6 % SOLN as needed.   potassium chloride (KLOR-CON) 10 MEQ tablet Take 1 tablet (10 mEq total) by mouth daily for 3 days.   Psyllium (METAMUCIL PO) Take 1 Scoop by mouth at bedtime as needed (constipation- mix and drink as directed).   RESTASIS 0.05 % ophthalmic emulsion Place 1 drop into both eyes 2 (two) times daily.   rosuvastatin (CRESTOR) 5 MG tablet Take 5 mg by mouth daily.   spironolactone (ALDACTONE) 25  MG tablet Take 0.5 tablets (12.5 mg total) by mouth daily.   vitamin E 200 UNIT capsule Take 200 Units by mouth daily.   [DISCONTINUED] furosemide (LASIX) 20 MG tablet Take 1 tablet (20 mg total) by mouth daily.     Allergies:   Tape, Celexa [citalopram hydrobromide], Fluconazole, Green tea (camellia sinensis), Green tea leaf ext, Hydralazine hcl, Kenalog [triamcinolone], Levaquin [levofloxacin], Losartan potassium-hctz, Magnesium citrate, Methocarbamol, Nitrofurantoin macrocrystal, Scopolamine, Sulfa antibiotics, Tea, and Trimethoprim   Social History   Socioeconomic History   Marital status: Widowed    Spouse name: Not on file  Number of children: Not on file   Years of education: Not on file   Highest education level: Not on file  Occupational History   Not on file  Tobacco Use   Smoking status: Former    Types: Cigarettes    Quit date: 02/21/1982    Years since quitting: 41.1   Smokeless tobacco: Never  Vaping Use   Vaping Use: Never used  Substance and Sexual Activity   Alcohol use: No   Drug use: No   Sexual activity: Never    Birth control/protection: Post-menopausal  Other Topics Concern   Not on file  Social History Narrative   Not on file   Social Determinants of Health   Financial Resource Strain: Not on file  Food Insecurity: Not on file  Transportation Needs: Not on file  Physical Activity: Not on file  Stress: Not on file  Social Connections: Not on file    Social:  Daughter is in [place] and had two kids' she has multiple family members one who graduated [employer] (grandson in [place]); can't come see her grandkids because of her hip pain.[employer]  Family History: The patient's family history includes Breast cancer in her sister; Cancer in her brother and father; Diabetes in her maternal aunt.  ROS:   Please see the history of present illness.   All other systems reviewed and are negative.  EKGs/Labs/Other Studies Reviewed:    The  following studies were reviewed today:  EKG:   03/24/22: SR 66 01/21/21: SR 65 WNL  Cardiac Studies & Procedures     STRESS TESTS  MYOCARDIAL PERFUSION IMAGING 04/14/2018  Narrative  Nuclear stress EF: 77%.  The left ventricular ejection fraction is hyperdynamic (>65%).  There was no ST segment deviation noted during stress.  The study is normal.  This is a low risk study.  Normal pharmacologic nuclear stress test with no evidence for prior infarct or ischemia. Hyperdynamic LVEF.   ECHOCARDIOGRAM  ECHOCARDIOGRAM COMPLETE 04/08/2022  Narrative ECHOCARDIOGRAM REPORT    Patient Name:   Christina Sellers Date of Exam: 04/08/2022 Medical Rec #:  [id number]         Height:       58.0 in Accession #:    [id number]        Weight:       136.0 lb Date of Birth:  Aug 10, 1941         BSA:          1.546 m Patient Age:    80 years          BP:           185/70 mmHg Patient Gender: F                 HR:           76 bpm. Exam Location:  [address]  Procedure: 2D Echo, 3D Echo, Color Doppler, Cardiac Doppler and Strain Analysis  Indications:    R06.09 Dyspnea on exertion  History:        Patient has prior history of Echocardiogram examinations, most recent 02/02/2021. PVD and COPD, Signs/Symptoms:Orthostatic hypotension, Syncope and Shortness of Breath; Risk Factors:Hypertension, Diabetes, Sleep Apnea and Former Smoker.  Sonographer:    Jorje Guild [facility], RDCS Referring Phys: [id number] [facility] A Alyssabeth Bruster  IMPRESSIONS   1. Left ventricular ejection fraction, by estimation, is 60 to 65%. The left ventricle has normal function. The left ventricle has no regional wall motion abnormalities. Left ventricular diastolic parameters  are consistent with Grade I diastolic dysfunction (impaired relaxation). The average left ventricular global longitudinal strain is -24.5 %. The global longitudinal strain is normal. 2. Right ventricular systolic function is normal. The right ventricular size  is normal. There is normal pulmonary artery systolic pressure. The estimated right ventricular systolic pressure is 34.1 mmHg. 3. Left atrial size was moderately dilated. 4. The mitral valve is normal in structure. No evidence of mitral valve regurgitation. No evidence of mitral stenosis. 5. The aortic valve is tricuspid. Aortic valve regurgitation is not visualized. Aortic valve sclerosis/calcification is present, without any evidence of aortic stenosis. 6. The inferior vena cava is normal in size with greater than 50% respiratory variability, suggesting right atrial pressure of 3 mmHg.  Comparison(s): 02/02/21 EF 65-70. Mild-moderate TR.  FINDINGS Left Ventricle: Left ventricular ejection fraction, by estimation, is 60 to 65%. The left ventricle has normal function. The left ventricle has no regional wall motion abnormalities. The average left ventricular global longitudinal strain is -24.5 %. The global longitudinal strain is normal. 3D left ventricular ejection fraction analysis performed but not reported based on interpreter judgement due to suboptimal tracking. The left ventricular internal cavity size was normal in size. There is no left ventricular hypertrophy. Left ventricular diastolic parameters are consistent with Grade I diastolic dysfunction (impaired relaxation). Normal left ventricular filling pressure.  Right Ventricle: The right ventricular size is normal. No increase in right ventricular wall thickness. Right ventricular systolic function is normal. There is normal pulmonary artery systolic pressure. The tricuspid regurgitant velocity is 2.79 m/s, and with an assumed right atrial pressure of 3 mmHg, the estimated right ventricular systolic pressure is 34.1 mmHg.  Left Atrium: Left atrial size was moderately dilated.  Right Atrium: Right atrial size was normal in size.  Pericardium: There is no evidence of pericardial effusion.  Mitral Valve: The mitral valve is normal in  structure. Mild to moderate mitral annular calcification. No evidence of mitral valve regurgitation. No evidence of mitral valve stenosis.  Tricuspid Valve: The tricuspid valve is normal in structure. Tricuspid valve regurgitation is trivial. No evidence of tricuspid stenosis.  Aortic Valve: The aortic valve is tricuspid. Aortic valve regurgitation is not visualized. Aortic valve sclerosis/calcification is present, without any evidence of aortic stenosis.  Pulmonic Valve: The pulmonic valve was normal in structure. Pulmonic valve regurgitation is not visualized. No evidence of pulmonic stenosis.  Aorta: The aortic root is normal in size and structure.  Venous: The inferior vena cava is normal in size with greater than 50% respiratory variability, suggesting right atrial pressure of 3 mmHg.  IAS/Shunts: No atrial level shunt detected by color flow Doppler.   LEFT VENTRICLE PLAX 2D LVIDd:         4.40 cm   Diastology LVIDs:         2.90 cm   LV e' medial:    6.96 cm/s LV PW:         0.90 cm   LV E/e' medial:  9.7 LV IVS:        1.00 cm   LV e' lateral:   6.96 cm/s LVOT diam:     2.00 cm   LV E/e' lateral: 9.7 LV SV:         80 LV SV Index:   52        2D Longitudinal Strain LVOT Area:     3.14 cm  2D Strain GLS (A2C):   -23.4 % 2D Strain GLS (A3C):   -26.7 % 2D  Strain GLS (A4C):   -23.4 % 2D Strain GLS Avg:     -24.5 %  3D Volume EF: 3D EF:        58 % LV EDV:       100 ml LV ESV:       42 ml LV SV:        58 ml  RIGHT VENTRICLE             IVC RV Basal diam:  3.50 cm     IVC diam: 1.80 cm RV S prime:     13.40 cm/s TAPSE (M-mode): 2.7 cm RVSP:           34.1 mmHg  LEFT ATRIUM             Index        RIGHT ATRIUM           Index LA diam:        4.00 cm 2.59 cm/m   RA Pressure: 3.00 mmHg LA Vol (A2C):   70.2 ml 45.41 ml/m  RA Area:     14.10 cm LA Vol (A4C):   57.0 ml 36.87 ml/m  RA Volume:   38.60 ml  24.97 ml/m LA Biplane Vol: 63.8 ml 41.27 ml/m AORTIC VALVE LVOT  Vmax:   116.00 cm/s LVOT Vmean:  79.100 cm/s LVOT VTI:    0.256 m  AORTA Ao Root diam: 2.70 cm Ao Asc diam:  2.50 cm  MITRAL VALVE               TRICUSPID VALVE TR Peak grad:   31.1 mmHg MV Decel Time: 197 msec    TR Vmax:        279.00 cm/s MV E velocity: 67.60 cm/s  Estimated RAP:  3.00 mmHg MV A velocity: 96.90 cm/s  RVSP:           34.1 mmHg MV E/A ratio:  0.70 SHUNTS Systemic VTI:  0.26 m Systemic Diam: 2.00 cm  Armanda Magic MD Electronically signed by Armanda Magic MD Signature Date/Time: 04/08/2022/3:49:05 PM    Final    MONITORS  LONG TERM MONITOR (3-14 DAYS) 02/12/2023  Narrative   Patient had a minimum heart rate of 45 bpm, maximum heart rate of 154 bpm, and average heart rate of 72 bpm.   Predominant underlying rhythm was sinus rhythm.   Four short runs of SVT.   Isolated PACs were rare (<1.0%).   Isolated PVCs were rare (<1.0%).   Triggered and diary events associated with sinus rhythm and rarely PACs.  Rare, asymptomatic paroxsymal SVT.            Carotid Duplex: Date:02/14/21 Results: Summary:  Right Carotid: Velocities in the right ICA are consistent with a 40-59%                 stenosis.   Left Carotid: Velocities in the left ICA are consistent with a 1-39%  stenosis.   Vertebrals:  Right vertebral artery demonstrates antegrade flow. Left  vertebral               artery demonstrates bidirectional flow.  Subclavians: Normal flow hemodynamics were seen in bilateral subclavian               arteries.   LE Duplex Venous: Date: 02/15/21 Results: Summary:  BILATERAL:  - No evidence of deep vein thrombosis seen in the lower extremities,  bilaterally.  -No evidence of popliteal cyst, bilaterally.  RIGHT:  - No  cystic structure found in the popliteal fossa.     LEFT:  - No cystic structure found in the popliteal fossa.    Recent Labs: 09/24/2022: NT-Pro BNP 749 04/14/2023: ALT 17; B Natriuretic Peptide 98.1; BUN 49; Creatinine, Ser 1.26;  Hemoglobin 13.2; Magnesium 2.3; Platelets 177; Potassium 3.2; Sodium 136  Recent Lipid Panel    Component Value Date/Time   CHOL 149 08/26/2011 0400   TRIG 195 (H) 08/26/2011 0400   HDL 37 (L) 08/26/2011 0400   CHOLHDL 4.0 08/26/2011 0400   VLDL 39 08/26/2011 0400   LDLCALC 73 08/26/2011 0400    Physical Exam:    VS:  BP (!) 184/72   Pulse 68   Ht 4\' 10"  (1.473 m)   Wt 129 lb (58.5 kg)   SpO2 96%   BMI 26.96 kg/m     Wt Readings from Last 3 Encounters:  04/18/23 129 lb (58.5 kg)  03/07/23 133 lb (60.3 kg)  02/09/23 136 lb (61.7 kg)    GEN:  Well nourished, well developed in no distress HEENT: Normal CARDIAC: RRR systolic murmur RESPIRATORY:  Clear to auscultation without rales, wheezing or rhonchi  ABDOMEN: Soft, non-tender, non-distended MUSCULOSKELETAL:  +1 bilateral edema NEUROLOGIC:  Alert and oriented X3 PSYCHIATRIC:  Normal affect   ASSESSMENT:    1. Hypertension associated with diabetes (HCC)   2. Chronic heart failure with preserved ejection fraction (HCC)   3. Palpitations      PLAN:    Diastolic Dysfunction Diabetes with Hypertension Hypokalemia Leg swelling with prior knee replacements - in the past had near-syncope with HCTZ and CCB (high dose); had leg swelling and bruising with hydralazine - she wishes to try q48 lasix; will get BMP and Mg with [place] or PCP; she will monitor for worsening swelling, weight gain, or SOB - she did not wish to continue with the spironolactone, at 25 mg; we decreased to 12.5 mg PO daily  - we again have discussed that with volume status and CKD that keeping her euvolemic will be more difficulty - continue SGLT2i -Referral to Dr. Duke Salvia for renal denervation eval or trial considreation  PAC P-SVT - she is not amenable to retrial of BB at this time; may consider coreg 6.25 mg PO BID; she is amenable to repeat heart monitor  HLD with DM Mild CAS on aspirin  Aortic atherosclerosis -LDL goal less than 70 at goal no  change in therapy  Keep summer appointment       Medication Adjustments/Labs and Tests Ordered: Current medicines are reviewed at length with the patient today.  Concerns regarding medicines are outlined above.  Orders Placed This Encounter  Procedures   Basic metabolic panel   Magnesium   AMB REFERRAL TO ADVANCED HTN CLINIC   LONG TERM MONITOR (3-14 DAYS)    Meds ordered this encounter  Medications   furosemide (LASIX) 20 MG tablet    Sig: Take 1 tablet (20 mg total) by mouth every other day.    Dispense:  90 tablet    Refill:  3     Patient Instructions  Medication Instructions:  Your physician has recommended you make the following change in your medication:  TAKE: furosemide (Lasix) 20 mg by mouth every other day.  *If you need a refill on your cardiac medications before your next appointment, please call your pharmacy*   Lab Work: IN 5-7 DAYS: BMP, Mg  If you have labs (blood work) drawn today and your tests are completely normal, you will  receive your results only by: MyChart Message (if you have MyChart) OR A paper copy in the mail If you have any lab test that is abnormal or we need to change your treatment, we will call you to review the results.   Testing/Procedures: Your physician has referred you to the [facility].  Dr. Duke Salvia  Your physician has requested that you wear a heart monitor.    Follow-Up:As scheduled At [facility], you and your health needs are our priority.  As part of our continuing mission to provide you with exceptional heart care, we have created designated Provider Care Teams.  These Care Teams include your primary Cardiologist (physician) and Advanced Practice Providers (APPs -  Physician Assistants and Nurse Practitioners) who all work together to provide you with the care you need, when you need it.   Provider:   [name], MD     Other Instructions Christena Deem- Long Term Monitor  Instructions  Your physician has requested you wear a ZIO patch monitor for 7 days.  This is a single patch monitor. [employer] supplies one patch monitor per enrollment. Additional stickers are not available. Please do not apply patch if you will be having a Nuclear Stress Test,   Cardiac CT, MRI, or Chest Xray during the period you would be wearing the  monitor. The patch cannot be worn during these tests. You cannot remove and re-apply the  ZIO XT patch monitor.  Your ZIO patch monitor will be mailed 3 day USPS to your address on file. It may take 3-5 days  to receive your monitor after you have been enrolled.  Once you have received your monitor, please review the enclosed instructions. Your monitor  has already been registered assigning a specific monitor serial # to you.  Billing and Patient Assistance Program Information  We have supplied [employer] with any of your insurance information on file for billing purposes. [employer] offers a sliding scale Patient Assistance Program for patients that do not have  insurance, or whose insurance does not completely cover the cost of the ZIO monitor.  You must apply for the Patient Assistance Program to qualify for this discounted rate.  To apply, please call [employer] at [phone], select option 4, select option 2, ask to apply for  Patient Assistance Program. [name] will ask your household income, and how many people  are in your household. They will quote your out-of-pocket cost based on that information.  [employer] will also be able to set up a 78-month, interest-free payment plan if needed.  Applying the monitor   Shave hair from upper left chest.  Hold abrader disc by orange tab. Rub abrader in 40 strokes over the upper left chest as  indicated in your monitor instructions.  Clean area with 4 enclosed alcohol pads. Let dry.  Apply patch as indicated in monitor instructions. Patch will be placed under collarbone on left  side of chest with  arrow pointing upward.  Rub patch adhesive wings for 2 minutes. Remove white label marked "1". Remove the white  label marked "2". Rub patch adhesive wings for 2 additional minutes.  While looking in a mirror, press and release button in center of patch. A small green light will  flash 3-4 times. This will be your only indicator that the monitor has been turned on.  Do not shower for the first 24 hours. You may shower after the first 24 hours.  Press the button if you feel a symptom.  You will hear a small click. Record Date, Time and  Symptom in the Patient Logbook.  When you are ready to remove the patch, follow instructions on the last 2 pages of Patient  Logbook. Stick patch monitor onto the last page of Patient Logbook.  Place Patient Logbook in the blue and white box. Use locking tab on box and tape box closed  securely. The blue and white box has prepaid postage on it. Please place it in the mailbox as  soon as possible. Your physician should have your test results approximately 7 days after the  monitor has been mailed back to [facility].  Call [facility] Customer Care at [phone] if you have questions regarding  your ZIO XT patch monitor. Call them immediately if you see an orange light blinking on your  monitor.  If your monitor falls off in less than 4 days, contact our Monitor department at [phone].  If your monitor becomes loose or falls off after 4 days call [employer] at [phone] for  suggestions on securing your monitor     Signed, [name], MD  04/18/2023 5:50 PM    [facility]

## 2023-04-18 NOTE — Patient Instructions (Signed)
Medication Sellers:  Your physician has recommended you make the following change in your medication:  TAKE: furosemide (Lasix) 20 mg by mouth every other day.  *If you need a refill on your cardiac medications before your next appointment, please call your pharmacy*   Lab Work: IN 5-7 DAYS: BMP, Mg  If you have labs (blood work) drawn today and your tests are completely normal, you will receive your results only by: MyChart Message (if you have MyChart) OR A paper copy in the mail If you have any lab test that is abnormal or we need to change your treatment, we will call you to review the results.   Testing/Procedures: Your physician has referred you to the [facility].  Dr. Duke Sellers  Your physician has requested that you wear a heart monitor.    Follow-Up:As scheduled At [facility], you and your health needs are our priority.  As part of our continuing mission to provide you with exceptional heart care, we have created designated Provider Care Teams.  These Care Teams include your primary Cardiologist (physician) and Advanced Practice Providers (APPs -  Physician Assistants and Nurse Practitioners) who all work together to provide you with the care you need, when you need it.   Provider:   [name], MD     Other Sellers Christina Sellers  Your physician has requested you wear a ZIO patch monitor for 7 days.  This is a single patch monitor. [employer] supplies one patch monitor per enrollment. Additional stickers are not available. Please do not apply patch if you will be having a Nuclear Stress Test,   Cardiac CT, MRI, or Chest Xray during the period you would be wearing the  monitor. The patch cannot be worn during these tests. You cannot remove and re-apply the  ZIO XT patch monitor.  Your ZIO patch monitor will be mailed 3 day USPS to your address on file. It may take 3-5 days  to receive your monitor  after you have been enrolled.  Once you have received your monitor, please review the enclosed Sellers. Your monitor  has already been registered assigning a specific monitor serial # to you.  Billing and Patient Assistance Program Information  We have supplied [employer] with any of your insurance information on file for billing purposes. [employer] offers a sliding scale Patient Assistance Program for patients that do not have  insurance, or whose insurance does not completely cover the cost of the ZIO monitor.  You must apply for the Patient Assistance Program to qualify for this discounted rate.  To apply, please call [employer] at [phone], select option 4, select option 2, ask to apply for  Patient Assistance Program. Christina Sellers will ask your household income, and how many people  are in your household. They will quote your out-of-pocket cost based on that information.  [employer] will also be able to set up a 28-month, interest-free payment plan if needed.  Applying the monitor   Shave hair from upper left chest.  Hold abrader disc by orange tab. Rub abrader in 40 strokes over the upper left chest as  indicated in your monitor Sellers.  Clean area with 4 enclosed alcohol pads. Let dry.  Apply patch as indicated in monitor Sellers. Patch will be placed under collarbone on left  side of chest with arrow pointing upward.  Rub patch adhesive wings for 2 minutes. Remove white label marked "1". Remove the white  label marked "2". Rub  patch adhesive wings for 2 additional minutes.  While looking in a mirror, press and release button in center of patch. A small green light will  flash 3-4 times. This will be your only indicator that the monitor has been turned on.  Do not shower for the first 24 hours. You may shower after the first 24 hours.  Press the button if you feel a symptom. You will hear a small click. Record Date, Time and  Symptom in the Patient Logbook.  When you are  ready to remove the patch, follow Sellers on the last 2 pages of Patient  Logbook. Stick patch monitor onto the last page of Patient Logbook.  Place Patient Logbook in the blue and white box. Use locking tab on box and tape box closed  securely. The blue and white box has prepaid postage on it. Please place it in the mailbox as  soon as possible. Your physician should have your test results approximately 7 days after the  monitor has been mailed back to [facility].  Call [facility] Customer Care at [phone] if you have questions regarding  your ZIO XT patch monitor. Call them immediately if you see an orange light blinking on your  monitor.  If your monitor falls off in less than 4 days, contact our Monitor department at [phone].  If your monitor becomes loose or falls off after 4 days call [employer] at [phone] for  suggestions on securing your monitor

## 2023-04-19 ENCOUNTER — Telehealth: Payer: Self-pay | Admitting: Internal Medicine

## 2023-04-19 ENCOUNTER — Ambulatory Visit: Payer: Medicare Other

## 2023-04-19 DIAGNOSIS — R002 Palpitations: Secondary | ICD-10-CM

## 2023-04-19 DIAGNOSIS — I5032 Chronic diastolic (congestive) heart failure: Secondary | ICD-10-CM

## 2023-04-19 DIAGNOSIS — I152 Hypertension secondary to endocrine disorders: Secondary | ICD-10-CM

## 2023-04-19 NOTE — Telephone Encounter (Signed)
Patient is unable to go to her appt today to have her monitor placed. Patient is requesting to come in on Thursday around noon. Please advise.

## 2023-04-20 DIAGNOSIS — I10 Essential (primary) hypertension: Secondary | ICD-10-CM | POA: Diagnosis not present

## 2023-04-20 DIAGNOSIS — E876 Hypokalemia: Secondary | ICD-10-CM | POA: Diagnosis not present

## 2023-04-20 DIAGNOSIS — R1013 Epigastric pain: Secondary | ICD-10-CM | POA: Diagnosis not present

## 2023-04-20 DIAGNOSIS — K5909 Other constipation: Secondary | ICD-10-CM | POA: Diagnosis not present

## 2023-04-20 DIAGNOSIS — Z0000 Encounter for general adult medical examination without abnormal findings: Secondary | ICD-10-CM | POA: Diagnosis not present

## 2023-04-21 ENCOUNTER — Ambulatory Visit: Payer: Medicare Other | Attending: Internal Medicine

## 2023-04-21 DIAGNOSIS — R002 Palpitations: Secondary | ICD-10-CM | POA: Diagnosis not present

## 2023-04-21 NOTE — Progress Notes (Unsigned)
ZIO XT serial # [id number] from office inventory applied to patient using tincture of benzoin.

## 2023-04-22 DIAGNOSIS — M2012 Hallux valgus (acquired), left foot: Secondary | ICD-10-CM | POA: Diagnosis not present

## 2023-04-22 DIAGNOSIS — M79672 Pain in left foot: Secondary | ICD-10-CM | POA: Diagnosis not present

## 2023-04-22 DIAGNOSIS — L84 Corns and callosities: Secondary | ICD-10-CM | POA: Diagnosis not present

## 2023-04-22 DIAGNOSIS — M7742 Metatarsalgia, left foot: Secondary | ICD-10-CM | POA: Diagnosis not present

## 2023-04-25 ENCOUNTER — Ambulatory Visit: Payer: Medicare Other | Attending: Internal Medicine

## 2023-04-25 DIAGNOSIS — I152 Hypertension secondary to endocrine disorders: Secondary | ICD-10-CM

## 2023-04-25 DIAGNOSIS — I5032 Chronic diastolic (congestive) heart failure: Secondary | ICD-10-CM

## 2023-04-25 DIAGNOSIS — R002 Palpitations: Secondary | ICD-10-CM

## 2023-04-25 DIAGNOSIS — E1159 Type 2 diabetes mellitus with other circulatory complications: Secondary | ICD-10-CM | POA: Diagnosis not present

## 2023-04-26 ENCOUNTER — Ambulatory Visit (INDEPENDENT_AMBULATORY_CARE_PROVIDER_SITE_OTHER): Payer: Medicare Other | Admitting: Nurse Practitioner

## 2023-04-26 VITALS — BP 144/60 | Temp 98.1°F

## 2023-04-26 DIAGNOSIS — N3000 Acute cystitis without hematuria: Secondary | ICD-10-CM | POA: Diagnosis not present

## 2023-04-26 DIAGNOSIS — R300 Dysuria: Secondary | ICD-10-CM

## 2023-04-26 DIAGNOSIS — R5381 Other malaise: Secondary | ICD-10-CM

## 2023-04-26 LAB — BASIC METABOLIC PANEL
BUN/Creatinine Ratio: 34 — ABNORMAL HIGH (ref 12–28)
BUN: 40 mg/dL — ABNORMAL HIGH (ref 8–27)
CO2: 18 mmol/L — ABNORMAL LOW (ref 20–29)
Calcium: 9.7 mg/dL (ref 8.7–10.3)
Chloride: 98 mmol/L (ref 96–106)
Creatinine, Ser: 1.18 mg/dL — ABNORMAL HIGH (ref 0.57–1.00)
Glucose: 99 mg/dL (ref 70–99)
Potassium: 4.5 mmol/L (ref 3.5–5.2)
Sodium: 137 mmol/L (ref 134–144)
eGFR: 46 mL/min/{1.73_m2} — ABNORMAL LOW (ref >59)

## 2023-04-26 LAB — MAGNESIUM: Magnesium: 2.5 mg/dL — ABNORMAL HIGH (ref 1.6–2.3)

## 2023-04-26 NOTE — Progress Notes (Unsigned)
   Acute Office Visit  Subjective:    Patient ID: Christina Sellers, female    DOB: 1941-02-11, 82 y.o.   MRN: [id number]   HPI 82 y.o. presents today for suspected UTI per patient. Mild burning with urination. Denies urgency, frequency or hematuria. Had chills yesterday and just felt bad overall. She did not check temperature. Feels better today. Denies vaginal symptoms.  H/O lichen sclerosus, good management with Clobetasol.   No LMP recorded. Patient is postmenopausal.    Review of Systems  Constitutional:  Positive for chills (Yesterday) and fatigue (Yesterday).  Genitourinary: Negative.        Objective:    Physical Exam Constitutional:      Appearance: Normal appearance.  Genitourinary:    Comments: Hypopigmentation with anterior fusion c/w LS    BP (!) 144/60 (BP Location: Right Arm, Cuff Size: Normal)   Temp 98.1 F (36.7 C)  Wt Readings from Last 3 Encounters:  04/18/23 129 lb (58.5 kg)  03/07/23 133 lb (60.3 kg)  02/09/23 136 lb (61.7 kg)        Patient informed chaperone available to be present for breast and/or pelvic exam. Patient has requested no chaperone to be present. Patient has been advised what will be completed during breast and pelvic exam.   UA  Assessment & Plan:   Problem List Items Addressed This Visit       Genitourinary   Acute cystitis without hematuria - Primary   Relevant Orders   Urinalysis,Complete w/RFL Culture   Other Visit Diagnoses     Malaise       Burning with urination               Olivia Mackie DNP, 4:22 PM 04/26/2023

## 2023-04-28 ENCOUNTER — Telehealth: Payer: Self-pay

## 2023-04-28 LAB — URINALYSIS, COMPLETE W/RFL CULTURE
Bilirubin Urine: NEGATIVE
Hgb urine dipstick: NEGATIVE
Hyaline Cast: NONE SEEN /LPF
Ketones, ur: NEGATIVE
Nitrites, Initial: NEGATIVE
RBC / HPF: NONE SEEN /HPF (ref 0–2)
Specific Gravity, Urine: 1.013 (ref 1.001–1.035)
pH: 5.5 (ref 5.0–8.0)

## 2023-04-28 LAB — URINE CULTURE
MICRO NUMBER:: 14989539
SPECIMEN QUALITY:: ADEQUATE

## 2023-04-28 LAB — CULTURE INDICATED

## 2023-04-28 NOTE — Telephone Encounter (Signed)
Left VM per DPR regarding urine culture being negative. Informed pt to call if any questions

## 2023-05-01 ENCOUNTER — Other Ambulatory Visit: Payer: Self-pay

## 2023-05-01 ENCOUNTER — Encounter (HOSPITAL_COMMUNITY): Payer: Self-pay

## 2023-05-01 ENCOUNTER — Emergency Department (HOSPITAL_COMMUNITY): Payer: Medicare Other

## 2023-05-01 ENCOUNTER — Emergency Department (HOSPITAL_COMMUNITY)
Admission: EM | Admit: 2023-05-01 | Discharge: 2023-05-01 | Disposition: A | Discharge status: Home / Self Care | Payer: Medicare Other | Attending: Emergency Medicine | Admitting: Emergency Medicine

## 2023-05-01 DIAGNOSIS — E119 Type 2 diabetes mellitus without complications: Secondary | ICD-10-CM | POA: Diagnosis not present

## 2023-05-01 DIAGNOSIS — R1110 Vomiting, unspecified: Secondary | ICD-10-CM | POA: Diagnosis not present

## 2023-05-01 DIAGNOSIS — R609 Edema, unspecified: Secondary | ICD-10-CM | POA: Diagnosis not present

## 2023-05-01 DIAGNOSIS — Z743 Need for continuous supervision: Secondary | ICD-10-CM | POA: Diagnosis not present

## 2023-05-01 DIAGNOSIS — I10 Essential (primary) hypertension: Secondary | ICD-10-CM | POA: Diagnosis not present

## 2023-05-01 DIAGNOSIS — Z20822 Contact with and (suspected) exposure to covid-19: Secondary | ICD-10-CM | POA: Diagnosis not present

## 2023-05-01 DIAGNOSIS — R0602 Shortness of breath: Secondary | ICD-10-CM | POA: Diagnosis not present

## 2023-05-01 DIAGNOSIS — R7989 Other specified abnormal findings of blood chemistry: Secondary | ICD-10-CM | POA: Diagnosis not present

## 2023-05-01 DIAGNOSIS — J449 Chronic obstructive pulmonary disease, unspecified: Secondary | ICD-10-CM | POA: Diagnosis not present

## 2023-05-01 DIAGNOSIS — R079 Chest pain, unspecified: Secondary | ICD-10-CM | POA: Diagnosis not present

## 2023-05-01 DIAGNOSIS — Z7984 Long term (current) use of oral hypoglycemic drugs: Secondary | ICD-10-CM | POA: Insufficient documentation

## 2023-05-01 DIAGNOSIS — Z79899 Other long term (current) drug therapy: Secondary | ICD-10-CM | POA: Diagnosis not present

## 2023-05-01 DIAGNOSIS — R0789 Other chest pain: Secondary | ICD-10-CM | POA: Insufficient documentation

## 2023-05-01 DIAGNOSIS — R6889 Other general symptoms and signs: Secondary | ICD-10-CM | POA: Diagnosis not present

## 2023-05-01 LAB — RESP PANEL BY RT-PCR (RSV, FLU A&B, COVID)  RVPGX2
Influenza A by PCR: NEGATIVE
Influenza B by PCR: NEGATIVE
Resp Syncytial Virus by PCR: NEGATIVE
SARS Coronavirus 2 by RT PCR: NEGATIVE

## 2023-05-01 LAB — COMPREHENSIVE METABOLIC PANEL
ALT: 18 U/L (ref 0–44)
AST: 30 U/L (ref 15–41)
Albumin: 4.1 g/dL (ref 3.5–5.0)
Alkaline Phosphatase: 58 U/L (ref 38–126)
Anion gap: 15 (ref 5–15)
BUN: 35 mg/dL — ABNORMAL HIGH (ref 8–23)
CO2: 23 mmol/L (ref 22–32)
Calcium: 9.1 mg/dL (ref 8.9–10.3)
Chloride: 98 mmol/L (ref 98–111)
Creatinine, Ser: 1.19 mg/dL — ABNORMAL HIGH (ref 0.44–1.00)
GFR, Estimated: 45 mL/min — ABNORMAL LOW (ref >60)
Glucose, Bld: 132 mg/dL — ABNORMAL HIGH (ref 70–99)
Potassium: 3.2 mmol/L — ABNORMAL LOW (ref 3.5–5.1)
Sodium: 136 mmol/L (ref 135–145)
Total Bilirubin: 0.8 mg/dL (ref 0.3–1.2)
Total Protein: 7 g/dL (ref 6.5–8.1)

## 2023-05-01 LAB — CBC
HCT: 36.6 % (ref 36.0–46.0)
Hemoglobin: 12.3 g/dL (ref 12.0–15.0)
MCH: 33.6 pg (ref 26.0–34.0)
MCHC: 33.6 g/dL (ref 30.0–36.0)
MCV: 100 fL (ref 80.0–100.0)
Platelets: 150 10*3/uL (ref 150–400)
RBC: 3.66 MIL/uL — ABNORMAL LOW (ref 3.87–5.11)
RDW: 13.6 % (ref 11.5–15.5)
WBC: 10.7 10*3/uL — ABNORMAL HIGH (ref 4.0–10.5)
nRBC: 0 % (ref 0.0–0.2)

## 2023-05-01 LAB — TROPONIN I (HIGH SENSITIVITY)
Troponin I (High Sensitivity): 13 ng/L (ref <18)
Troponin I (High Sensitivity): 14 ng/L (ref <18)

## 2023-05-01 LAB — D-DIMER, QUANTITATIVE: D-Dimer, Quant: 1.08 ug/mL-FEU — ABNORMAL HIGH (ref 0.00–0.50)

## 2023-05-01 LAB — BRAIN NATRIURETIC PEPTIDE: B Natriuretic Peptide: 196 pg/mL — ABNORMAL HIGH (ref 0.0–100.0)

## 2023-05-01 MED ORDER — ALUM & MAG HYDROXIDE-SIMETH 200-200-20 MG/5ML PO SUSP
30.0000 mL | Freq: Once | ORAL | Status: AC
  Administered 2023-05-01: 30 mL via ORAL
  Filled 2023-05-01: qty 30

## 2023-05-01 MED ORDER — PANTOPRAZOLE SODIUM 20 MG PO TBEC: 20.0000 mg | DELAYED_RELEASE_TABLET | Freq: Every day | ORAL | 2 refills | Status: DC

## 2023-05-01 MED ORDER — IOHEXOL 350 MG/ML SOLN
75.0000 mL | Freq: Once | INTRAVENOUS | Status: AC | PRN
  Administered 2023-05-01: 75 mL via INTRAVENOUS

## 2023-05-01 MED ORDER — PREDNISONE 20 MG PO TABS: 40.0000 mg | ORAL_TABLET | Freq: Every day | ORAL | 0 refills | Status: AC

## 2023-05-01 MED ORDER — FAMOTIDINE IN NACL 20-0.9 MG/50ML-% IV SOLN
20.0000 mg | Freq: Once | INTRAVENOUS | Status: AC
  Administered 2023-05-01: 20 mg via INTRAVENOUS
  Filled 2023-05-01: qty 50

## 2023-05-01 MED ORDER — ACETAMINOPHEN 500 MG PO TABS
1000.0000 mg | ORAL_TABLET | ORAL | Status: AC
  Administered 2023-05-01: 1000 mg via ORAL
  Filled 2023-05-01: qty 2

## 2023-05-01 MED ORDER — POTASSIUM CHLORIDE CRYS ER 20 MEQ PO TBCR
40.0000 meq | EXTENDED_RELEASE_TABLET | Freq: Once | ORAL | Status: AC
  Administered 2023-05-01: 40 meq via ORAL
  Filled 2023-05-01: qty 2

## 2023-05-01 MED ORDER — LIDOCAINE VISCOUS HCL 2 % MT SOLN
15.0000 mL | Freq: Once | OROMUCOSAL | Status: AC
  Administered 2023-05-01: 15 mL via ORAL
  Filled 2023-05-01: qty 15

## 2023-05-01 NOTE — ED Provider Notes (Signed)
[place] EMERGENCY DEPARTMENT AT [facility] Provider Note   CSN: [id number] Arrival date & time: 05/01/23  0419     History  Chief Complaint  Patient presents with   Chest Pain    Christina Sellers is a 82 y.o. female.   Chest Pain  Patient is an 82 year old female with a past medical history significant for thyroid disease, depression, IBS, carotid artery stenosis, reflux, difficulty hearing, DM2, anxiety, Sjogren's syndrome, COPD, HTN   She is present emergency room today with complaints of chest pain that is difficult her for her to localize but seems to be sternal seems to radiate up both sides of her neck into her jaw.  She states that it is intermittent and seems to primarily occur when she takes a deep breath she has not had any hemoptysis nausea vomiting or diarrhea.  She states she is not particularly short of breath except for having pain when she breathes.  I do see that she has pleurisy under her diagnosis last however she states that this is a new issue for her.  No history of blood clots in the past.  She has had lower extremity ultrasounds done in the past to evaluate for DVT.  She denies any exertional worsening of her symptoms.  No fevers lightheadedness or dizziness syncope or near syncope.  Patient was transported by EMS and was given full dose aspirin.  No other medications  No recent surgeries, hospitalization, long travel, hemoptysis, estrogen containing OCP, cancer history. No history of PE or VTE.      Home Medications Prior to Admission medications   Medication Sig Start Date End Date Taking? Authorizing Provider  acetaminophen (TYLENOL) 650 MG CR tablet 2 tablets in the morning and at bedtime.    [provider]  amLODipine (NORVASC) 2.5 MG tablet Take 5 mg by mouth daily. 04/13/22   Marden Noble, MD  Apoaequorin (PREVAGEN PO) Take by mouth.    [provider]  ascorbic acid (VITAMIN C) 500 MG tablet Take 500 mg by mouth  daily.    [provider]  cetirizine (ZYRTEC ALLERGY) 10 MG tablet as needed for allergies.    [provider]  Cholecalciferol (VITAMIN D3) 50 MCG (2000 UT) capsule Take 2,000 mg by mouth daily.    [provider]  clobetasol ointment (TEMOVATE) 0.05 % Apply topically 2 (two) times a week. 02/10/23   Wyline Beady A, NP  cloNIDine (CATAPRES) 0.1 MG tablet Take 0.2 mg by mouth at bedtime. 08/25/22   [provider]  Cyanocobalamin (VITAMIN B12) 1000 MCG TBCR daily.    [provider]  denosumab (PROLIA) 60 MG/ML SOSY injection Inject 60 mg into the skin every 6 (six) months.    [provider]  diclofenac sodium (VOLTAREN) 1 % GEL Apply 1 application topically 2 (two) times daily as needed (to painful sites). 03/29/19   [provider]  empagliflozin (JARDIANCE) 10 MG TABS tablet Take by mouth daily.    [provider]  finasteride (PROSCAR) 5 MG tablet Take 2.5 mg by mouth daily. 02/19/21   [provider]  fluticasone (FLONASE) 50 MCG/ACT nasal spray Place 1 spray into both nostrils daily as needed (for seasonal allergies).    [provider]  furosemide (LASIX) 20 MG tablet Take 1 tablet (20 mg total) by mouth every other day. 04/18/23   Chandrasekhar, [place] Dragon A, MD  hydrocortisone 2.5 % cream Apply topically as needed. 01/27/22   [provider]  hypromellose (  SYSTANE OVERNIGHT THERAPY) 0.3 % GEL ophthalmic ointment Place 1 application into both eyes at bedtime as needed for dry eyes.    [provider]  levothyroxine (SYNTHROID) 75 MCG tablet Take 75 mcg by mouth every morning. 07/15/21   [provider]  meclizine (ANTIVERT) 25 MG tablet Take 25 mg by mouth as needed for dizziness. 11/30/22   [provider]  meloxicam (MOBIC) 7.5 MG tablet Take by mouth daily as needed. 07/13/22   [provider]  Multiple Vitamins-Minerals (MULTIVITAMIN WITH MINERALS) tablet Take 1  tablet by mouth daily.    [provider]  nystatin cream (MYCOSTATIN) Apply 1 application topically 2 (two) times daily. Patient taking differently: Apply 1 application  topically as needed for dry skin. 04/28/21   Olivia Mackie, NP  OVER THE COUNTER MEDICATION Take 2 capsules by mouth daily. [facility]    [provider]  Polyvinyl Alcohol-Povidone PF (REFRESH) 1.4-0.6 % SOLN as needed.    [provider]  potassium chloride (KLOR-CON) 10 MEQ tablet Take 1 tablet (10 mEq total) by mouth daily for 3 days. 04/16/23 04/19/23  Nira Conn, MD  Psyllium (METAMUCIL PO) Take 1 Scoop by mouth at bedtime as needed (constipation- mix and drink as directed).    [provider]  RESTASIS 0.05 % ophthalmic emulsion Place 1 drop into both eyes 2 (two) times daily.    [provider]  rosuvastatin (CRESTOR) 5 MG tablet Take 5 mg by mouth daily.    [provider]  spironolactone (ALDACTONE) 25 MG tablet Take 0.5 tablets (12.5 mg total) by mouth daily. 03/07/23   [name], MD  vitamin E 200 UNIT capsule Take 200 Units by mouth daily.    [provider]      Allergies    Tape, Celexa [citalopram hydrobromide], Fluconazole, Green tea (camellia sinensis), Green tea leaf ext, Hydralazine hcl, Kenalog [triamcinolone], Levaquin [levofloxacin], Losartan potassium-hctz, Magnesium citrate, Methocarbamol, Nitrofurantoin macrocrystal, Scopolamine, Sulfa antibiotics, Tea, and Trimethoprim    Review of Systems   Review of Systems  Cardiovascular:  Positive for chest pain.    Physical Exam Updated Vital Signs BP (!) 164/65 (BP Location: Left Arm)   Pulse 74   Temp 97.7 F (36.5 C) (Oral)   Resp 18   Ht 4\' 10"  (1.473 m)   Wt 57.6 kg   SpO2 98%   BMI 26.54 kg/m  Physical Exam Vitals and nursing note reviewed.  Constitutional:      General: She is not in acute distress. HENT:     Head: Normocephalic and atraumatic.      Nose: Nose normal.     Mouth/Throat:     Mouth: Mucous membranes are moist.  Eyes:     General: No scleral icterus. Cardiovascular:     Rate and Rhythm: Normal rate and regular rhythm.     Pulses: Normal pulses.     Heart sounds: Normal heart sounds.  Pulmonary:     Effort: Pulmonary effort is normal. No respiratory distress.     Breath sounds: No wheezing.     Comments: Speaking in full sentences, not tachypneic, SpO2 90% on room air, no crackles or wheezing auscultated. Abdominal:     Palpations: Abdomen is soft.     Tenderness: There is no abdominal tenderness. There is no guarding or rebound.  Musculoskeletal:     Cervical back: Normal range of motion.     Right lower leg: No edema.     Left  lower leg: No edema.     Comments: No lower extremity edema or calf tenderness  Skin:    General: Skin is warm and dry.     Capillary Refill: Capillary refill takes less than 2 seconds.  Neurological:     Mental Status: She is alert. Mental status is at baseline.  Psychiatric:        Mood and Affect: Mood normal.        Behavior: Behavior normal.     ED Results / Procedures / Treatments   Labs (all labs ordered are listed, but only abnormal results are displayed) Labs Reviewed  CBC - Abnormal; Notable for the following components:      Result Value   WBC 10.7 (*)    RBC 3.66 (*)    All other components within normal limits  COMPREHENSIVE METABOLIC PANEL - Abnormal; Notable for the following components:   Potassium 3.2 (*)    Glucose, Bld 132 (*)    BUN 35 (*)    Creatinine, Ser 1.19 (*)    GFR, Estimated 45 (*)    All other components within normal limits  RESP PANEL BY RT-PCR (RSV, FLU A&B, COVID)  RVPGX2  D-DIMER, QUANTITATIVE  BRAIN NATRIURETIC PEPTIDE  TROPONIN I (HIGH SENSITIVITY)  TROPONIN I (HIGH SENSITIVITY)    EKG None  Radiology DG Chest 2 View  Result Date: 05/01/2023 CLINICAL DATA:  Chest pain EXAM: CHEST - 2 VIEW COMPARISON:  04/14/2023 FINDINGS:  Normal heart size and mediastinal contours. No acute infiltrate or edema. No effusion or pneumothorax. No acute osseous findings. Artifact from EKG leads. IMPRESSION: No evidence of active disease. Electronically Signed   By: Tiburcio Pea M.D.   On: 05/01/2023 05:23    Procedures Procedures    Medications Ordered in ED Medications  acetaminophen (TYLENOL) tablet 1,000 mg (has no administration in time range)  potassium chloride SA (KLOR-CON M) CR tablet 40 mEq (has no administration in time range)  famotidine (PEPCID) IVPB 20 mg premix (has no administration in time range)    ED Course/ Medical Decision Making/ A&P                             Medical Decision Making Amount and/or Complexity of Data Reviewed Labs: ordered. Radiology: ordered.   This patient presents to the ED for concern of CP, this involves a number of treatment options, and is a complaint that carries with it a moderate to high risk of complications and morbidity. A differential diagnosis was considered for the patient's symptoms which is discussed below:   The emergent causes of chest pain include: Acute coronary syndrome, tamponade, pericarditis/myocarditis, aortic dissection, pulmonary embolism, tension pneumothorax, pneumonia, and esophageal rupture.    Co morbidities: Discussed in HPI   Brief History:  Patient is an 82 year old female with a past medical history significant for thyroid disease, depression, IBS, carotid artery stenosis, reflux, difficulty hearing, DM2, anxiety, Sjogren's syndrome, COPD, HTN   She is present emergency room today with complaints of chest pain that is difficult her for her to localize but seems to be sternal seems to radiate up both sides of her neck into her jaw.  She states that it is intermittent and seems to primarily occur when she takes a deep breath she has not had any hemoptysis nausea vomiting or diarrhea.  She states she is not particularly short of breath except  for having pain when she breathes.  I do  see that she has pleurisy under her diagnosis last however she states that this is a new issue for her.  No history of blood clots in the past.  She has had lower extremity ultrasounds done in the past to evaluate for DVT.  She denies any exertional worsening of her symptoms.  No fevers lightheadedness or dizziness syncope or near syncope.  Patient was transported by EMS and was given full dose aspirin.  No other medications  No recent surgeries, hospitalization, long travel, hemoptysis, estrogen containing OCP, cancer history. No history of PE or VTE.     EMR reviewed including pt PMHx, past surgical history and past visits to ER.   See HPI for more details   Lab Tests:   I ordered and independently interpreted labs. Labs notable for Troponin x 1 within normal limits, CMP notable for marginal hypokalemia 3.2, CBC with marginal leukocytosis no anemia.  Second troponin, respiratory panel, BNP, D-dimer pending  Imaging Studies:  NAD. I personally reviewed all imaging studies and no acute abnormality found. I agree with radiology interpretation.    Cardiac Monitoring:  The patient was maintained on a cardiac monitor.  I personally viewed and interpreted the cardiac monitored which showed an underlying rhythm of: NSR EKG non-ischemic   Medicines ordered:  I ordered medication including Tylenol for pain   Critical Interventions:     Consults/Attending Physician      Reevaluation:    Social Determinants of Health:      Problem List / ED Course:  Patient is 82 year old female with somewhat atypical sounding chest pain she has pain that seems to be sternal intermittent and seems to be pleuritic.  Apart from age she has no specific risk factors for pulmonary embolism will obtain D-dimer.  Patient has normal kidney function with benefit from CT PE study if D-dimer is elevated with age adjustment.  Troponin x 1 within normal  limits.  Vital signs normal no hypoxia no tachycardia.  EKG unchanged from prior does show Q waves inferiorly.  Pain is nonexertional and only seems to occur when she is lying down.  I have a low suspicion that this represents ACS.  Certainly could be a degree of pleurisy.  She has not been coughing and she is afebrile.  Respiratory 4 Plex is pending.    Dispostion:  Pt care handed off to AM team.  I feel that the patent would benefit from likely discharge home if workup reassuring.    Final Clinical Impression(s) / ED Diagnoses Final diagnoses:  Atypical chest pain    Rx / DC Orders ED Discharge Orders     None         Gailen Shelter, [place] 05/01/23 0454    Marily Memos, MD 05/01/23 [phone]

## 2023-05-01 NOTE — ED Provider Notes (Signed)
  Physical Exam  BP (!) 151/62 (BP Location: Right Arm)   Pulse 73   Temp 98 F (36.7 C) (Oral)   Resp 17   Ht 4\' 10"  (1.473 m)   Wt 57.6 kg   SpO2 99%   BMI 26.54 kg/m   Physical Exam Vitals and nursing note reviewed.  Constitutional:      General: She is not in acute distress.    Appearance: Normal appearance. She is normal weight. She is not ill-appearing.     Comments: Resting comfortably in bed  HENT:     Head: Normocephalic and atraumatic.  Cardiovascular:     Heart sounds: Murmur heard.  Pulmonary:     Effort: Pulmonary effort is normal. No respiratory distress.  Chest:     Chest wall: Tenderness (anterior chest wall) present.  Abdominal:     General: Abdomen is flat.  Musculoskeletal:        General: Normal range of motion.     Cervical back: Neck supple.  Skin:    General: Skin is warm and dry.  Neurological:     Mental Status: She is alert and oriented to person, place, and time.  Psychiatric:        Mood and Affect: Mood normal.        Behavior: Behavior normal.     Procedures  Procedures  ED Course / MDM    Medical Decision Making Amount and/or Complexity of Data Reviewed Labs: ordered. Radiology: ordered.  Risk OTC drugs. Prescription drug management.  Assumed care at shift change from [facility], PA-C. Please see his note for full HPI. In short, 82 year old female with extensive past medical history presents with substernal chest pain. Worse with deep inspiration and lying supine. Plan is to repeat troponin, obtain D dimer and reassess. Will need PE study if positive dimer. Will have attending assess patient.  0800- PE scan negative. Patient describes the pain as sharp. Worse with deep inspiration and lying supine. Has some chest wall TTP. No bruising or rashes. Most likely pleurisy vs costochondritis vs pericarditis. I had a shared decision making conversation with the patient regarding admission for continued monitoring vs discharge with  close cardiology follow up. She agrees to follow up with her cardiologist.  Will send prescription for prednisone to treat her symptoms.  Will also send prescription for Protonix as she reported improvement in her symptoms with GI cocktail in the ED.  She was given strict return precautions.  Stable at discharge.  At this time there does not appear to be any evidence of an acute emergency medical condition and the patient appears stable for discharge with appropriate outpatient follow up. Diagnosis was discussed with patient who verbalizes understanding of care plan and is agreeable to discharge. I have discussed return precautions with patient who verbalizes understanding. Patient encouraged to follow-up with their cardiologist within the next 5 days. All questions answered.  Patient's case discussed with Dr. Durwin Nora who agrees with plan to discharge with follow-up.   Note: Portions of this report may have been transcribed using voice recognition software. Every effort was made to ensure accuracy; however, inadvertent computerized transcription errors may still be present.       Michelle Piper, Cordelia Poche 05/01/23 1610    Gloris Manchester, MD 05/03/23 [phone]

## 2023-05-01 NOTE — Discharge Instructions (Addendum)
You have been seen today for your complaint of chest pain. Your lab work was overall reassuring. Your imaging was reassuring and showed no abnormalities. Your discharge medications include prednisone.  This is a steroid.  Take it as prescribed and for the entire duration of the prescription. Follow up with: Your cardiologist within the next 5 days. Please seek immediate medical care if you develop any of the following symptoms: Your chest pain gets worse. You have a cough that gets worse, or you cough up blood. You have severe pain in your abdomen. You faint. You have sudden, unexplained chest discomfort. You have sudden, unexplained discomfort in your arms, back, neck, or jaw. You have shortness of breath at any time. You suddenly start to sweat, or your skin gets clammy. You feel nausea or you vomit. You suddenly feel lightheaded or dizzy. You have severe weakness, or unexplained weakness or fatigue. Your heart begins to beat quickly, or it feels like it is skipping beats. At this time there does not appear to be the presence of an emergent medical condition, however there is always the potential for conditions to change. Please read and follow the below instructions.  Do not take your medicine if  develop an itchy rash, swelling in your mouth or lips, or difficulty breathing; call 911 and seek immediate emergency medical attention if this occurs.  You may review your lab tests and imaging results in their entirety on your MyChart account.  Please discuss all results of fully with your primary care provider and other specialist at your follow-up visit.  Note: Portions of this text may have been transcribed using voice recognition software. Every effort was made to ensure accuracy; however, inadvertent computerized transcription errors may still be present.

## 2023-05-01 NOTE — ED Notes (Signed)
Patient transported to CT 

## 2023-05-01 NOTE — ED Triage Notes (Signed)
Pt c/o chest tightness x48 hours.  Reports that when she lays back she feels pain that goes up both sides of her neck & up her jaw. States that it hurts when she takes a deep breath. Denies N/V/D, SOB.

## 2023-05-03 DIAGNOSIS — M7502 Adhesive capsulitis of left shoulder: Secondary | ICD-10-CM | POA: Diagnosis not present

## 2023-05-03 DIAGNOSIS — M19012 Primary osteoarthritis, left shoulder: Secondary | ICD-10-CM | POA: Diagnosis not present

## 2023-05-05 DIAGNOSIS — M19012 Primary osteoarthritis, left shoulder: Secondary | ICD-10-CM | POA: Diagnosis not present

## 2023-05-05 DIAGNOSIS — M24812 Other specific joint derangements of left shoulder, not elsewhere classified: Secondary | ICD-10-CM | POA: Diagnosis not present

## 2023-05-05 DIAGNOSIS — M25512 Pain in left shoulder: Secondary | ICD-10-CM | POA: Diagnosis not present

## 2023-05-12 DIAGNOSIS — E1159 Type 2 diabetes mellitus with other circulatory complications: Secondary | ICD-10-CM | POA: Diagnosis not present

## 2023-05-12 DIAGNOSIS — R002 Palpitations: Secondary | ICD-10-CM | POA: Diagnosis not present

## 2023-05-17 DIAGNOSIS — M25612 Stiffness of left shoulder, not elsewhere classified: Secondary | ICD-10-CM | POA: Diagnosis not present

## 2023-05-17 DIAGNOSIS — M6281 Muscle weakness (generalized): Secondary | ICD-10-CM | POA: Diagnosis not present

## 2023-05-23 ENCOUNTER — Telehealth: Payer: Self-pay | Admitting: Internal Medicine

## 2023-05-23 NOTE — Telephone Encounter (Signed)
Pt c/o swelling: STAT is pt has developed SOB within 24 hours  If swelling, where is the swelling located?   Both legs, left foot very swollen  How much weight have you gained and in what time span?   No.  Patient stated she has lost 2 lbs.  Have you gained 3 pounds in a day or 5 pounds in a week?   Do you have a log of your daily weights (if so, list)?   No, generally weighs every day  Are you currently taking a fluid pill?   Yes  Are you currently SOB?   No  Have you traveled recently?   Patient stated she had a flair up with her shoulder and is OK with her breathing.

## 2023-05-23 NOTE — Telephone Encounter (Signed)
-----   Message from [name], MD sent at 05/22/2023  5:51 PM EDT ----- Results:  No malignant arrhythmias Action:  No change in plan

## 2023-05-23 NOTE — Telephone Encounter (Signed)
The patient has been notified of the result and verbalized understanding.  All questions (if any) were answered. Macie Burows, RN 05/23/2023 8:44 AM   Called pt to review test results pt reports the following issue.  Swelling to left foot that began a couple of days ago.  Wants to know if needs to be seen in office. Denies increased salt intake, no deli/ canned meats or take out.  Wears compression socks daily when ankle is pressed denies indentation.  Elevates legs throughout the day when able.  Shoes fit tighter.  Is taking furosemide 20 mg PO every other day as ordered.  Does not check BP reports feels fine.   Advised pt MD not in office today but will send message to be addressed upon return.

## 2023-05-24 DIAGNOSIS — M25512 Pain in left shoulder: Secondary | ICD-10-CM | POA: Diagnosis not present

## 2023-05-24 DIAGNOSIS — M19012 Primary osteoarthritis, left shoulder: Secondary | ICD-10-CM | POA: Diagnosis not present

## 2023-05-25 NOTE — Telephone Encounter (Signed)
[name], MD  At our last visit, she mentioned foot pain. If this is the same foot that caused her pain, this may be the etiology of her swelling  Called pt advised of MD response above.  Pt reports both legs are swollen but are better than before. Pt ate out a couple of days in the last week for birthday.  Advised this may be source of swelling.  Pt will continue to monitor if swelling worsens pt will call back. No further concerns at this time.

## 2023-05-30 DIAGNOSIS — Z79899 Other long term (current) drug therapy: Secondary | ICD-10-CM | POA: Diagnosis not present

## 2023-06-01 DIAGNOSIS — I440 Atrioventricular block, first degree: Secondary | ICD-10-CM | POA: Diagnosis not present

## 2023-06-01 DIAGNOSIS — R0602 Shortness of breath: Secondary | ICD-10-CM | POA: Diagnosis not present

## 2023-06-01 DIAGNOSIS — Z7984 Long term (current) use of oral hypoglycemic drugs: Secondary | ICD-10-CM | POA: Diagnosis not present

## 2023-06-01 DIAGNOSIS — Z87891 Personal history of nicotine dependence: Secondary | ICD-10-CM | POA: Diagnosis not present

## 2023-06-01 DIAGNOSIS — R748 Abnormal levels of other serum enzymes: Secondary | ICD-10-CM | POA: Diagnosis not present

## 2023-06-01 DIAGNOSIS — E876 Hypokalemia: Secondary | ICD-10-CM | POA: Diagnosis not present

## 2023-06-01 DIAGNOSIS — R079 Chest pain, unspecified: Secondary | ICD-10-CM | POA: Diagnosis not present

## 2023-06-01 DIAGNOSIS — I2510 Atherosclerotic heart disease of native coronary artery without angina pectoris: Secondary | ICD-10-CM | POA: Diagnosis not present

## 2023-06-01 DIAGNOSIS — E1122 Type 2 diabetes mellitus with diabetic chronic kidney disease: Secondary | ICD-10-CM | POA: Diagnosis not present

## 2023-06-01 DIAGNOSIS — J90 Pleural effusion, not elsewhere classified: Secondary | ICD-10-CM | POA: Diagnosis not present

## 2023-06-01 DIAGNOSIS — Z8673 Personal history of transient ischemic attack (TIA), and cerebral infarction without residual deficits: Secondary | ICD-10-CM | POA: Diagnosis not present

## 2023-06-01 DIAGNOSIS — D72829 Elevated white blood cell count, unspecified: Secondary | ICD-10-CM | POA: Diagnosis not present

## 2023-06-01 DIAGNOSIS — I2699 Other pulmonary embolism without acute cor pulmonale: Secondary | ICD-10-CM | POA: Diagnosis not present

## 2023-06-01 DIAGNOSIS — I129 Hypertensive chronic kidney disease with stage 1 through stage 4 chronic kidney disease, or unspecified chronic kidney disease: Secondary | ICD-10-CM | POA: Diagnosis not present

## 2023-06-01 DIAGNOSIS — I349 Nonrheumatic mitral valve disorder, unspecified: Secondary | ICD-10-CM | POA: Diagnosis not present

## 2023-06-01 DIAGNOSIS — I10 Essential (primary) hypertension: Secondary | ICD-10-CM | POA: Diagnosis not present

## 2023-06-01 DIAGNOSIS — R0789 Other chest pain: Secondary | ICD-10-CM | POA: Diagnosis not present

## 2023-06-01 DIAGNOSIS — N1831 Chronic kidney disease, stage 3a: Secondary | ICD-10-CM | POA: Diagnosis not present

## 2023-06-01 DIAGNOSIS — E039 Hypothyroidism, unspecified: Secondary | ICD-10-CM | POA: Diagnosis not present

## 2023-06-01 DIAGNOSIS — I480 Paroxysmal atrial fibrillation: Secondary | ICD-10-CM | POA: Diagnosis not present

## 2023-06-01 DIAGNOSIS — R7989 Other specified abnormal findings of blood chemistry: Secondary | ICD-10-CM | POA: Diagnosis not present

## 2023-06-01 DIAGNOSIS — N179 Acute kidney failure, unspecified: Secondary | ICD-10-CM | POA: Diagnosis not present

## 2023-06-01 DIAGNOSIS — E119 Type 2 diabetes mellitus without complications: Secondary | ICD-10-CM | POA: Diagnosis not present

## 2023-06-01 DIAGNOSIS — J439 Emphysema, unspecified: Secondary | ICD-10-CM | POA: Diagnosis not present

## 2023-06-01 DIAGNOSIS — I1310 Hypertensive heart and chronic kidney disease without heart failure, with stage 1 through stage 4 chronic kidney disease, or unspecified chronic kidney disease: Secondary | ICD-10-CM | POA: Diagnosis not present

## 2023-06-02 DIAGNOSIS — N1831 Chronic kidney disease, stage 3a: Secondary | ICD-10-CM | POA: Diagnosis not present

## 2023-06-02 DIAGNOSIS — E119 Type 2 diabetes mellitus without complications: Secondary | ICD-10-CM | POA: Diagnosis not present

## 2023-06-02 DIAGNOSIS — E039 Hypothyroidism, unspecified: Secondary | ICD-10-CM | POA: Diagnosis not present

## 2023-06-02 DIAGNOSIS — R0602 Shortness of breath: Secondary | ICD-10-CM | POA: Diagnosis not present

## 2023-06-02 DIAGNOSIS — R600 Localized edema: Secondary | ICD-10-CM | POA: Diagnosis not present

## 2023-06-02 DIAGNOSIS — E876 Hypokalemia: Secondary | ICD-10-CM | POA: Diagnosis not present

## 2023-06-02 DIAGNOSIS — I349 Nonrheumatic mitral valve disorder, unspecified: Secondary | ICD-10-CM | POA: Diagnosis not present

## 2023-06-02 DIAGNOSIS — I480 Paroxysmal atrial fibrillation: Secondary | ICD-10-CM | POA: Diagnosis not present

## 2023-06-02 DIAGNOSIS — N179 Acute kidney failure, unspecified: Secondary | ICD-10-CM | POA: Diagnosis not present

## 2023-06-02 DIAGNOSIS — R079 Chest pain, unspecified: Secondary | ICD-10-CM | POA: Diagnosis not present

## 2023-06-02 DIAGNOSIS — D72829 Elevated white blood cell count, unspecified: Secondary | ICD-10-CM | POA: Diagnosis not present

## 2023-06-02 DIAGNOSIS — I129 Hypertensive chronic kidney disease with stage 1 through stage 4 chronic kidney disease, or unspecified chronic kidney disease: Secondary | ICD-10-CM | POA: Diagnosis not present

## 2023-06-06 ENCOUNTER — Encounter: Payer: Self-pay | Admitting: Internal Medicine

## 2023-06-06 ENCOUNTER — Ambulatory Visit: Payer: Medicare Other | Attending: Internal Medicine | Admitting: Internal Medicine

## 2023-06-06 ENCOUNTER — Telehealth: Payer: Self-pay | Admitting: Internal Medicine

## 2023-06-06 VITALS — BP 142/60 | HR 67 | Ht <= 58 in | Wt 126.0 lb

## 2023-06-06 DIAGNOSIS — I700 Atherosclerosis of aorta: Secondary | ICD-10-CM

## 2023-06-06 DIAGNOSIS — I6523 Occlusion and stenosis of bilateral carotid arteries: Secondary | ICD-10-CM | POA: Diagnosis not present

## 2023-06-06 DIAGNOSIS — I152 Hypertension secondary to endocrine disorders: Secondary | ICD-10-CM | POA: Diagnosis not present

## 2023-06-06 DIAGNOSIS — E1159 Type 2 diabetes mellitus with other circulatory complications: Secondary | ICD-10-CM | POA: Diagnosis not present

## 2023-06-06 DIAGNOSIS — I359 Nonrheumatic aortic valve disorder, unspecified: Secondary | ICD-10-CM
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
## 2023-06-06 NOTE — Telephone Encounter (Signed)
MD is able to see ED notes via EPIC.

## 2023-06-06 NOTE — Patient Instructions (Signed)
Medication Instructions:  Your physician recommends that you continue on your current medications as directed. Please refer to the Current Medication list given to you today.  *If you need a refill on your cardiac medications before your next appointment, please call your pharmacy*   Lab Work: NONE If you have labs (blood work) drawn today and your tests are completely normal, you will receive your results only by: MyChart Message (if you have MyChart) OR A paper copy in the mail If you have any lab test that is abnormal or we need to change your treatment, we will call you to review the results.   Testing/Procedures: NONE   Follow-Up:As scheduled At [facility], you and your health needs are our priority.  As part of our continuing mission to provide you with exceptional heart care, we have created designated Provider Care Teams.  These Care Teams include your primary Cardiologist (physician) and Advanced Practice Providers (APPs -  Physician Assistants and Nurse Practitioners) who all work together to provide you with the care you need, when you need it.    Provider:   Riley Lam, MD

## 2023-06-06 NOTE — Telephone Encounter (Signed)
Pt states that she would like for nurse to call hospital below to request her ED records for today's visit. Please advise  [facility] - [phone]

## 2023-06-13 DIAGNOSIS — R1013 Epigastric pain: Secondary | ICD-10-CM | POA: Diagnosis not present

## 2023-06-13 DIAGNOSIS — M87059 Idiopathic aseptic necrosis of unspecified femur: Secondary | ICD-10-CM | POA: Diagnosis not present

## 2023-06-13 DIAGNOSIS — E119 Type 2 diabetes mellitus without complications: Secondary | ICD-10-CM | POA: Diagnosis not present

## 2023-06-13 DIAGNOSIS — G319 Degenerative disease of nervous system, unspecified: Secondary | ICD-10-CM | POA: Diagnosis not present

## 2023-06-15 ENCOUNTER — Ambulatory Visit
Admission: RE | Admit: 2023-06-15 | Discharge: 2023-06-15 | Disposition: A | Discharge status: Home / Self Care | Payer: Medicare Other | Source: Ambulatory Visit | Attending: Internal Medicine | Admitting: Internal Medicine

## 2023-06-15 ENCOUNTER — Institutional Professional Consult (permissible substitution) (HOSPITAL_BASED_OUTPATIENT_CLINIC_OR_DEPARTMENT_OTHER): Payer: Medicare Other | Admitting: Cardiovascular Disease

## 2023-06-15 ENCOUNTER — Other Ambulatory Visit: Payer: Self-pay | Admitting: Internal Medicine

## 2023-06-15 DIAGNOSIS — K5730 Diverticulosis of large intestine without perforation or abscess without bleeding: Secondary | ICD-10-CM | POA: Diagnosis not present

## 2023-06-15 DIAGNOSIS — R1084 Generalized abdominal pain: Secondary | ICD-10-CM | POA: Diagnosis not present

## 2023-06-15 DIAGNOSIS — R1032 Left lower quadrant pain: Secondary | ICD-10-CM

## 2023-06-15 DIAGNOSIS — M8708 Idiopathic aseptic necrosis of bone, other site: Secondary | ICD-10-CM | POA: Diagnosis not present

## 2023-06-15 DIAGNOSIS — I700 Atherosclerosis of aorta: Secondary | ICD-10-CM | POA: Diagnosis not present

## 2023-06-15 MED ORDER — IOPAMIDOL (ISOVUE-300) INJECTION 61%
100.0000 mL | Freq: Once | INTRAVENOUS | Status: AC | PRN
  Administered 2023-06-15: 50 mL via INTRAVENOUS

## 2023-06-21 ENCOUNTER — Other Ambulatory Visit: Payer: Self-pay | Admitting: Internal Medicine

## 2023-06-21 ENCOUNTER — Ambulatory Visit
Admission: RE | Admit: 2023-06-21 | Discharge: 2023-06-21 | Disposition: A | Discharge status: Home / Self Care | Payer: Medicare Other | Source: Ambulatory Visit | Attending: Internal Medicine | Admitting: Internal Medicine

## 2023-06-21 DIAGNOSIS — R0602 Shortness of breath: Secondary | ICD-10-CM

## 2023-06-21 DIAGNOSIS — J90 Pleural effusion, not elsewhere classified: Secondary | ICD-10-CM | POA: Diagnosis not present

## 2023-06-21 DIAGNOSIS — R1013 Epigastric pain: Secondary | ICD-10-CM | POA: Diagnosis not present

## 2023-06-21 DIAGNOSIS — I5032 Chronic diastolic (congestive) heart failure: Secondary | ICD-10-CM | POA: Diagnosis not present

## 2023-06-21 DIAGNOSIS — Z79899 Other long term (current) drug therapy: Secondary | ICD-10-CM | POA: Diagnosis not present

## 2023-06-23 DIAGNOSIS — R1084 Generalized abdominal pain: Secondary | ICD-10-CM | POA: Diagnosis not present

## 2023-06-23 DIAGNOSIS — K219 Gastro-esophageal reflux disease without esophagitis: Secondary | ICD-10-CM | POA: Diagnosis not present

## 2023-06-30 ENCOUNTER — Encounter: Payer: Self-pay | Admitting: Internal Medicine

## 2023-06-30 ENCOUNTER — Ambulatory Visit: Payer: Medicare Other | Admitting: Internal Medicine

## 2023-06-30 VITALS — BP 150/70 | HR 76 | Ht <= 58 in | Wt 119.4 lb

## 2023-06-30 DIAGNOSIS — I152 Hypertension secondary to endocrine disorders: Secondary | ICD-10-CM | POA: Diagnosis not present

## 2023-06-30 DIAGNOSIS — I5032 Chronic diastolic (congestive) heart failure: Secondary | ICD-10-CM | POA: Diagnosis not present

## 2023-06-30 DIAGNOSIS — H04123 Dry eye syndrome of bilateral lacrimal glands: Secondary | ICD-10-CM | POA: Diagnosis not present

## 2023-06-30 DIAGNOSIS — E113212 Type 2 diabetes mellitus with mild nonproliferative diabetic retinopathy with macular edema, left eye: Secondary | ICD-10-CM | POA: Diagnosis not present

## 2023-06-30 DIAGNOSIS — Z961 Presence of intraocular lens: Secondary | ICD-10-CM | POA: Diagnosis not present

## 2023-06-30 DIAGNOSIS — I700 Atherosclerosis of aorta: Secondary | ICD-10-CM

## 2023-06-30 DIAGNOSIS — H353131 Nonexudative age-related macular degeneration, bilateral, early dry stage: Secondary | ICD-10-CM | POA: Diagnosis not present

## 2023-06-30 DIAGNOSIS — I471 Supraventricular tachycardia, unspecified: Secondary | ICD-10-CM | POA: Diagnosis not present

## 2023-06-30 DIAGNOSIS — E1159 Type 2 diabetes mellitus with other circulatory complications: Secondary | ICD-10-CM

## 2023-06-30 NOTE — Patient Instructions (Signed)
Medication Instructions:  Your physician recommends that you continue on your current medications as directed. Please refer to the Current Medication list given to you today.   *If you need a refill on your cardiac medications before your next appointment, please call your pharmacy*   Lab Work: BMP, BNP  If you have labs (blood work) drawn today and your tests are completely normal, you will receive your results only by: MyChart Message (if you have MyChart) OR A paper copy in the mail If you have any lab test that is abnormal or we need to change your treatment, we will call you to review the results.   Testing/Procedures: NONE   Follow-Up: At [facility], you and your health needs are our priority.  As part of our continuing mission to provide you with exceptional heart care, we have created designated Provider Care Teams.  These Care Teams include your primary Cardiologist (physician) and Advanced Practice Providers (APPs -  Physician Assistants and Nurse Practitioners) who all work together to provide you with the care you need, when you need it.    Your next appointment:   3 month(s)  Provider:   [name], MD

## 2023-06-30 NOTE — Progress Notes (Signed)
Cardiology Office Note:    Date:  06/30/2023   ID:  Christina Sellers, DOB 12/24/1940, MRN [id number]  PCP:  Thana Ates, MD  Provider Notes:  Hearing Impaired   [facility]  Cardiologist:  [name], MD  Advanced Practice Provider:  No care team member to display Electrophysiologist:  None      CC: Follow up hospistalization  History of Present Illness:    Christina Sellers is a 82 y.o. female with a hx of PAD (CAS), hx of obstructive lung disease, HLD with T2DM with aortic atherosclerosis, HTN with DM who presented for evaluation 01/21/21. 2022: Had syncope issues including syncope during an office visit.  Seen in the hospital during that admission and her diuretics and BB were stopped due to intolerance. Had right hip surgery 03/30/21.  Has done better of CCB and thiazide 2023: TR improved.  BP high.  Had issues with hydralazine and increased norvasc with Dr. Kevan Ny. Dr. Kevan Ny retired.  Moved to Dr. Margaretann Loveless.  I started low dose MRA.  2024: Started on SGLT2i.  Did not want to start MRA increase; creatinine increased ~ 20% with increase.  Has June ED visit for chest pain- thought to be pericarditis.   Had [facility] admission for chest pain.  Negative work up. Reviewed her should pain from the [facility].  Saw Dr. Margaretann Loveless and her diuretic increased.  Patient notes that she is doing fine from her perspective.   Had pleural effusions and restarted lasix. Daughter notes more weakness and farigue. Her daugther from [place] has come to this visit.  She is in a walker today.  No chest pain or pressure .  No SOB/DOE and no PND/Orthopnea.  No weight gain or leg swelling.  No palpitations or syncope .  Now taking all of her medications.   Allergies:   Tape, Celexa [citalopram hydrobromide], Fluconazole, Green tea (camellia sinensis), Green tea leaf ext, Hydralazine hcl, Kenalog [triamcinolone], Levaquin [levofloxacin], Losartan potassium-hctz,  Magnesium citrate, Methocarbamol, Nitrofurantoin macrocrystal, Scopolamine, Sulfa antibiotics, Tea, Tramadol, and Trimethoprim   Social History   Socioeconomic History   Marital status: Widowed    Spouse name: Not on file   Number of children: Not on file   Years of education: Not on file   Highest education level: Not on file  Occupational History   Not on file  Tobacco Use   Smoking status: Former    Current packs/day: 0.00    Types: Cigarettes    Quit date: 02/21/1982    Years since quitting: 41.3   Smokeless tobacco: Never  Vaping Use   Vaping status: Never Used  Substance and Sexual Activity   Alcohol use: No   Drug use: No   Sexual activity: Never    Birth control/protection: Post-menopausal  Other Topics Concern   Not on file  Social History Narrative   Not on file   Social Determinants of Health   Financial Resource Strain: Not on file  Food Insecurity: Not on file  Transportation Needs: Not on file  Physical Activity: Not on file  Stress: Not on file  Social Connections: Not on file    Social:  Daughter is in [place] and had two kids' she has multiple family members one who graduated dental school (grandson in [place]); can't come see her grandkids because of her hip pain. Celebrates Passover.  Daugther came tin 06/2023 visit  Family History: The patient's family history includes Breast cancer in her sister; Cancer in  her brother and father; Diabetes in her maternal aunt.  ROS:   Please see the history of present illness.   EKGs/Labs/Other Studies Reviewed:    The following studies were reviewed today:  Cardiac Studies & Procedures     STRESS TESTS  MYOCARDIAL PERFUSION IMAGING 04/14/2018  Narrative  Nuclear stress EF: 77%.  The left ventricular ejection fraction is hyperdynamic (>65%).  There was no ST segment deviation noted during stress.  The study is normal.  This is a low risk study.  Normal pharmacologic nuclear stress test with no  evidence for prior infarct or ischemia. Hyperdynamic LVEF.   ECHOCARDIOGRAM  ECHOCARDIOGRAM COMPLETE 04/08/2022  Narrative ECHOCARDIOGRAM REPORT    Patient Name:   Christina Sellers Date of Exam: 04/08/2022 Medical Rec #:  [id number]         Height:       58.0 in Accession #:    [id number]        Weight:       136.0 lb Date of Birth:  05-28-1941         BSA:          1.546 m Patient Age:    80 years          BP:           185/70 mmHg Patient Gender: F                 HR:           76 bpm. Exam Location:  [address]  Procedure: 2D Echo, 3D Echo, Color Doppler, Cardiac Doppler and Strain Analysis  Indications:    R06.09 Dyspnea on exertion  History:        Patient has prior history of Echocardiogram examinations, most recent 02/02/2021. PVD and COPD, Signs/Symptoms:Orthostatic hypotension, Syncope and Shortness of Breath; Risk Factors:Hypertension, Diabetes, Sleep Apnea and Former Smoker.  Sonographer:    Jorje Guild [facility], RDCS Referring Phys: [id number] [facility] A Kyser Wandel  IMPRESSIONS   1. Left ventricular ejection fraction, by estimation, is 60 to 65%. The left ventricle has normal function. The left ventricle has no regional wall motion abnormalities. Left ventricular diastolic parameters are consistent with Grade I diastolic dysfunction (impaired relaxation). The average left ventricular global longitudinal strain is -24.5 %. The global longitudinal strain is normal. 2. Right ventricular systolic function is normal. The right ventricular size is normal. There is normal pulmonary artery systolic pressure. The estimated right ventricular systolic pressure is 34.1 mmHg. 3. Left atrial size was moderately dilated. 4. The mitral valve is normal in structure. No evidence of mitral valve regurgitation. No evidence of mitral stenosis. 5. The aortic valve is tricuspid. Aortic valve regurgitation is not visualized. Aortic valve sclerosis/calcification is present, without any evidence of  aortic stenosis. 6. The inferior vena cava is normal in size with greater than 50% respiratory variability, suggesting right atrial pressure of 3 mmHg.  Comparison(s): 02/02/21 EF 65-70. Mild-moderate TR.  FINDINGS Left Ventricle: Left ventricular ejection fraction, by estimation, is 60 to 65%. The left ventricle has normal function. The left ventricle has no regional wall motion abnormalities. The average left ventricular global longitudinal strain is -24.5 %. The global longitudinal strain is normal. 3D left ventricular ejection fraction analysis performed but not reported based on interpreter judgement due to suboptimal tracking. The left ventricular internal cavity size was normal in size. There is no left ventricular hypertrophy. Left ventricular diastolic parameters are consistent with Grade I diastolic dysfunction (impaired relaxation). Normal left ventricular  filling pressure.  Right Ventricle: The right ventricular size is normal. No increase in right ventricular wall thickness. Right ventricular systolic function is normal. There is normal pulmonary artery systolic pressure. The tricuspid regurgitant velocity is 2.79 m/s, and with an assumed right atrial pressure of 3 mmHg, the estimated right ventricular systolic pressure is 34.1 mmHg.  Left Atrium: Left atrial size was moderately dilated.  Right Atrium: Right atrial size was normal in size.  Pericardium: There is no evidence of pericardial effusion.  Mitral Valve: The mitral valve is normal in structure. Mild to moderate mitral annular calcification. No evidence of mitral valve regurgitation. No evidence of mitral valve stenosis.  Tricuspid Valve: The tricuspid valve is normal in structure. Tricuspid valve regurgitation is trivial. No evidence of tricuspid stenosis.  Aortic Valve: The aortic valve is tricuspid. Aortic valve regurgitation is not visualized. Aortic valve sclerosis/calcification is present, without any evidence of  aortic stenosis.  Pulmonic Valve: The pulmonic valve was normal in structure. Pulmonic valve regurgitation is not visualized. No evidence of pulmonic stenosis.  Aorta: The aortic root is normal in size and structure.  Venous: The inferior vena cava is normal in size with greater than 50% respiratory variability, suggesting right atrial pressure of 3 mmHg.  IAS/Shunts: No atrial level shunt detected by color flow Doppler.   LEFT VENTRICLE PLAX 2D LVIDd:         4.40 cm   Diastology LVIDs:         2.90 cm   LV e' medial:    6.96 cm/s LV PW:         0.90 cm   LV E/e' medial:  9.7 LV IVS:        1.00 cm   LV e' lateral:   6.96 cm/s LVOT diam:     2.00 cm   LV E/e' lateral: 9.7 LV SV:         80 LV SV Index:   52        2D Longitudinal Strain LVOT Area:     3.14 cm  2D Strain GLS (A2C):   -23.4 % 2D Strain GLS (A3C):   -26.7 % 2D Strain GLS (A4C):   -23.4 % 2D Strain GLS Avg:     -24.5 %  3D Volume EF: 3D EF:        58 % LV EDV:       100 ml LV ESV:       42 ml LV SV:        58 ml  RIGHT VENTRICLE             IVC RV Basal diam:  3.50 cm     IVC diam: 1.80 cm RV S prime:     13.40 cm/s TAPSE (M-mode): 2.7 cm RVSP:           34.1 mmHg  LEFT ATRIUM             Index        RIGHT ATRIUM           Index LA diam:        4.00 cm 2.59 cm/m   RA Pressure: 3.00 mmHg LA Vol (A2C):   70.2 ml 45.41 ml/m  RA Area:     14.10 cm LA Vol (A4C):   57.0 ml 36.87 ml/m  RA Volume:   38.60 ml  24.97 ml/m LA Biplane Vol: 63.8 ml 41.27 ml/m AORTIC VALVE LVOT Vmax:   116.00 cm/s LVOT Vmean:  79.100 cm/s LVOT VTI:    0.256 m  AORTA Ao Root diam: 2.70 cm Ao Asc diam:  2.50 cm  MITRAL VALVE               TRICUSPID VALVE TR Peak grad:   31.1 mmHg MV Decel Time: 197 msec    TR Vmax:        279.00 cm/s MV E velocity: 67.60 cm/s  Estimated RAP:  3.00 mmHg MV A velocity: 96.90 cm/s  RVSP:           34.1 mmHg MV E/A ratio:  0.70 SHUNTS Systemic VTI:  0.26 m Systemic Diam: 2.00 cm  Armanda Magic MD Electronically signed by Armanda Magic MD Signature Date/Time: 04/08/2022/3:49:05 PM    Final    MONITORS  LONG TERM MONITOR (3-14 DAYS) 05/13/2023  Narrative   Patient had a minimum heart rate of 47 bpm, maximum heart rate of 194 bpm, and average heart rate of 69 bpm.   Predominant underlying rhythm was sinus rhythm.   One 4 beat run on NSVT.  One 11 beat run of slow SVT with artifact.   Isolated PACs were rare (<1.0%).   Isolated PVCs were rare (<1.0%).   Triggered and diary events associated with sinus rhythm.  No malignant arrhythmias.            Carotid Duplex: Date:02/14/21 Results: Summary:  Right Carotid: Velocities in the right ICA are consistent with a 40-59%                 stenosis.   Left Carotid: Velocities in the left ICA are consistent with a 1-39%  stenosis.   Vertebrals:  Right vertebral artery demonstrates antegrade flow. Left  vertebral               artery demonstrates bidirectional flow.  Subclavians: Normal flow hemodynamics were seen in bilateral subclavian               arteries.   LE Duplex Venous: Date: 02/15/21 Results: Summary:  BILATERAL:  - No evidence of deep vein thrombosis seen in the lower extremities,  bilaterally.  -No evidence of popliteal cyst, bilaterally.  RIGHT:  - No cystic structure found in the popliteal fossa.     LEFT:  - No cystic structure found in the popliteal fossa.    Recent Labs: 09/24/2022: NT-Pro BNP 749 04/25/2023: Magnesium 2.5 05/01/2023: ALT 18; B Natriuretic Peptide 196.0; BUN 35; Creatinine, Ser 1.19; Hemoglobin 12.3; Platelets 150; Potassium 3.2; Sodium 136  Recent Lipid Panel    Component Value Date/Time   CHOL 149 08/26/2011 0400   TRIG 195 (H) 08/26/2011 0400   HDL 37 (L) 08/26/2011 0400   CHOLHDL 4.0 08/26/2011 0400   VLDL 39 08/26/2011 0400   LDLCALC 73 08/26/2011 0400    Physical Exam:    VS:  BP (!) 150/70   Pulse 76   Ht 4\' 10"  (1.473 m)   Wt 119 lb 6.4 oz (54.2 kg)    SpO2 93%   BMI 24.95 kg/m     Wt Readings from Last 3 Encounters:  06/30/23 119 lb 6.4 oz (54.2 kg)  06/06/23 126 lb (57.2 kg)  05/01/23 127 lb (57.6 kg)    Gen: no distress, elderly female Neck: No JVD Cardiac: No Rubs or Gallops, systolic murmur, RRR +2 radial pulses Respiratory: Clear to auscultation bilaterally, normal effort, normal  respiratory rate GI: Soft, nontender, non-distended  MS: Non-pitting  edema;  moves all extremities walks  with walker Integument: Skin feels warm Neuro:  At time of evaluation, alert and oriented to person/place/time/situation    ASSESSMENT:    1. Hypertension associated with diabetes (HCC)   2. Chronic heart failure with preserved ejection fraction (HCC)   3. Aortic atherosclerosis (HCC)   4. Paroxysmal SVT (supraventricular tachycardia)     PLAN:    HFpEF Acute on chronic Diabetes with Hypertension - patient had not been amenable to taking medications; with this she has had worsening volume - patient and family are here today  - in the past had near-syncope with HCTZ and CCB (high dose); had leg swelling and bruising with hydralazine - on norvasc 5 mg  - will continue her daily lasix, SGLT2i and MRA; based on her BMP and BNP today will likely increase to MRA to 25 mg PO daily while continuing her current medications - Strongly recommend her to follow up with Dr. Duke Salvia for renal denervation eval or trial consideration   PAC P-SVT - she is not amenable to re-trial of BB at this time; may consider coreg 6.25 mg PO BID; she is amenable to repeat heart monitor; presently asymptomatic - [facility]- I do not have evidence of prior AF   HLD with DM Mild CAS on aspirin  Aortic atherosclerosis -LDL goal less than 70  Keep 07/14/23 f/u with HTN clinic    Medication Adjustments/Labs and Tests Ordered: Current medicines are reviewed at length with the patient today.  Concerns regarding medicines are outlined above.   Orders Placed This Encounter  Procedures   Basic metabolic panel   Pro b natriuretic peptide (BNP)    No orders of the defined types were placed in this encounter.    Patient Instructions  Medication Instructions:  Your physician recommends that you continue on your current medications as directed. Please refer to the Current Medication list given to you today.   *If you need a refill on your cardiac medications before your next appointment, please call your pharmacy*   Lab Work: BMP, BNP  If you have labs (blood work) drawn today and your tests are completely normal, you will receive your results only by: MyChart Message (if you have MyChart) OR A paper copy in the mail If you have any lab test that is abnormal or we need to change your treatment, we will call you to review the results.   Testing/Procedures: NONE   Follow-Up: At [facility], you and your health needs are our priority.  As part of our continuing mission to provide you with exceptional heart care, we have created designated Provider Care Teams.  These Care Teams include your primary Cardiologist (physician) and Advanced Practice Providers (APPs -  Physician Assistants and Nurse Practitioners) who all work together to provide you with the care you need, when you need it.    Your next appointment:   3 month(s)  Provider:   [name], MD        Signed, [name], MD  06/30/2023 11:38 AM    [facility]

## 2023-07-01 LAB — BASIC METABOLIC PANEL: eGFR: 51 mL/min/{1.73_m2} — ABNORMAL LOW (ref >59)

## 2023-07-05 ENCOUNTER — Telehealth: Payer: Self-pay | Admitting: Internal Medicine

## 2023-07-05 NOTE — Telephone Encounter (Signed)
Patient was returning call. Please advise ?

## 2023-07-06 MED ORDER — SPIRONOLACTONE 25 MG PO TABS: 25.0000 mg | ORAL_TABLET | Freq: Every day | ORAL | 3 refills | Status: DC

## 2023-07-06 NOTE — Telephone Encounter (Signed)
Patient is returning call. Transferred to Shamea, RN.  

## 2023-07-06 NOTE — Telephone Encounter (Signed)
Left a message to call back.

## 2023-07-06 NOTE — Addendum Note (Signed)
Addended by: Macie Burows on: 07/06/2023 02:05 PM   Modules accepted: Orders

## 2023-07-06 NOTE — Telephone Encounter (Signed)
Spoke with pt advised of lab results and MD recommendations: Results:  Creatinine has improved, K is low, BNP is elevated  Plan:  Increase to aldactone 25 mg PO daily; at HTN clinic eval, in addition to other lab eval that may be needed, will needs repeat BMP   [name], MD   Reviewed medication increase with pt numerous times.  Pt reports understands will increase from spironolactone 12.5 mg (1/2) tablet to a full tablet.  All questions answered.

## 2023-07-07 DIAGNOSIS — H903 Sensorineural hearing loss, bilateral: Secondary | ICD-10-CM | POA: Diagnosis not present

## 2023-07-12 DIAGNOSIS — A09 Infectious gastroenteritis and colitis, unspecified: Secondary | ICD-10-CM | POA: Diagnosis not present

## 2023-07-12 DIAGNOSIS — R197 Diarrhea, unspecified: Secondary | ICD-10-CM | POA: Diagnosis not present

## 2023-07-14 ENCOUNTER — Telehealth (HOSPITAL_BASED_OUTPATIENT_CLINIC_OR_DEPARTMENT_OTHER): Payer: Self-pay | Admitting: Family

## 2023-07-14 ENCOUNTER — Institutional Professional Consult (permissible substitution) (HOSPITAL_BASED_OUTPATIENT_CLINIC_OR_DEPARTMENT_OTHER): Payer: Medicare Other | Admitting: Cardiovascular Disease

## 2023-07-14 NOTE — Telephone Encounter (Signed)
Called to speak with patient to reschedule the 07/15/23 appointment with Gillian Shields, NP (original appointment was rescheduled by patient and rescheduled incorrectly)---Patient's daughter was also on the line and states the patient cannot wait until October to be seen---she has to keep her appointment on 07/15/23---The patient has been in the ED twice and feels terribel,  The daughter thinks her electrolytes and she cannot wait to be seen.  Will call patient back and see if they can come this afternoon (07/14/23 at 3:10 pm---Spoke with patient @ 8:55 am--she refused the appointment for this afternoon at 3:10 pm

## 2023-07-14 NOTE — Telephone Encounter (Signed)
Fyi, this is an ADV HTN pt. Rescheduled incorrectly and refusing to be rescheduled, okay to keep appointment for tomorrow

## 2023-07-15 ENCOUNTER — Encounter (HOSPITAL_BASED_OUTPATIENT_CLINIC_OR_DEPARTMENT_OTHER): Payer: Self-pay | Admitting: Family

## 2023-07-15 ENCOUNTER — Ambulatory Visit (HOSPITAL_BASED_OUTPATIENT_CLINIC_OR_DEPARTMENT_OTHER): Payer: Medicare Other | Admitting: Family

## 2023-07-15 VITALS — BP 168/78 | HR 69 | Ht <= 58 in | Wt 116.0 lb

## 2023-07-15 DIAGNOSIS — I6523 Occlusion and stenosis of bilateral carotid arteries: Secondary | ICD-10-CM

## 2023-07-15 DIAGNOSIS — I471 Supraventricular tachycardia, unspecified: Secondary | ICD-10-CM

## 2023-07-15 DIAGNOSIS — R0683 Snoring: Secondary | ICD-10-CM

## 2023-07-15 DIAGNOSIS — R2681 Unsteadiness on feet: Secondary | ICD-10-CM | POA: Diagnosis not present

## 2023-07-15 DIAGNOSIS — E785 Hyperlipidemia, unspecified: Secondary | ICD-10-CM

## 2023-07-15 DIAGNOSIS — I10 Essential (primary) hypertension: Secondary | ICD-10-CM | POA: Diagnosis not present

## 2023-07-15 DIAGNOSIS — R5381 Other malaise: Secondary | ICD-10-CM

## 2023-07-15 DIAGNOSIS — F419 Anxiety disorder, unspecified: Secondary | ICD-10-CM

## 2023-07-15 DIAGNOSIS — I5032 Chronic diastolic (congestive) heart failure: Secondary | ICD-10-CM | POA: Diagnosis not present

## 2023-07-15 DIAGNOSIS — I700 Atherosclerosis of aorta: Secondary | ICD-10-CM

## 2023-07-15 MED ORDER — ESCITALOPRAM OXALATE 5 MG PO TABS
5.0000 mg | ORAL_TABLET | Freq: Every day | ORAL | 1 refills | Status: DC
Start: 2023-07-15 — End: 2023-08-19

## 2023-07-15 MED ORDER — FUROSEMIDE 20 MG PO TABS: ORAL_TABLET | ORAL | 3 refills | Status: DC

## 2023-07-15 MED ORDER — CLONIDINE 0.1 MG/24HR TD PTWK
0.1000 mg | MEDICATED_PATCH | TRANSDERMAL | 12 refills | Status: DC
Start: 2023-07-15 — End: 2023-09-22

## 2023-07-15 NOTE — Progress Notes (Unsigned)
[facility]pert[facility] Initial Assessment:    Date:  07/15/2023   ID:  Christina Sellers, DOB Dec 30, 1940, MRN [id number]  PCP:  Thana Ates, MD  Cardiologist:  [name], MD  Nephrologist:  Referring MD: Riley Lam A*   CC: Hypertension  History of Present Illness:    Christina Sellers is a 82 y.o. female with a hx of hypertension, HFpEF, diabetes, aortic atherosclerosis, PAC, SVT carotid stenosis, CKD, HLD here to establish care in the [facility]pert[facility].   Prior cardiac testing includes: Sleep study approx 2020 with no sleep apnea. Renal duplex 03/2020 with no significant RAS. Echocardiogram 5//23 LVEF 60 to 65%, gr1DD, RV normal, LA moderately dilated. Carotid duplex 01/2023 right ICA 40-59% stenosed, left ICA 1 to 39% stenosis. CT abdomen 03/2023 with normal adrenal gland. Monitor 05/2023 predominantly normal sinus rhythm average heart rate 69 bpm, 1 4 beat run of NSVT and one 11 beat run of slow SVT with artifact.  Triggered and diary events associated with NSR. Of noite, adrenal insufficiency previously ruled out with normal ACTH.   Seen by Dr. Raynelle Jan 06/30/2023 recommended to continue Lasix, SGLT2, MRA for diastolic heart failure.  She was not amenable to a repeat-trial of beta-blocker. Recommended to establish with Hypertension Clinic for consideration of research trial vs RDN due to multiple prior intolerances and poor  control of BP causing diastolic heart failure exacerbations.   Christina Sellers was diagnosed with hypertension in her 78s or 57s. It has been difficult to control. Blood pressure checked with arm cuff at home. Though has not been checking recently. she reports tobacco use having quit in 1983.For exercise she has done chair yoga previously but no recent exercise. She has not ben driving recently.  She is having issues with dizziness while walking down the hall. Daughter notes she sometimes holds onto the wall to sit down.  This has been more recent. Has also had diarrhea and low appetite. This generalized inactivity has been over the last month, more bothersome over the last two weeks.   Two weeks ago she was worried she might have had a stroke as she "started feeling differently".   Shares with me she was int he hospital 2 years ago as she was over medicated. She had multiple falls which she attributed to her medications "more than I needed" this has made her understandably hesitant regarding medications. She was told at that time she had a stroke 6 years prior.   Present regimen: Amlodipine 5mg  daily Clonidine 0.2mg  at bedtime  Lasix 20mg  daily vs every other day? Spironolactone 25mg  daily  Previous antihypertensives: HCTZ-near-syncope CCB-near-syncope (high-dose) Hydralazine-leg swelling and bruising Losartan-hydrochlorothiazide - night sweats/fatigue Valsartan - unclear why stopped, last documented 2012  Secondary Causes of Hypertension  Medications/Herbal: OCP, steroids, stimulants, antidepressants, weight loss medication, immune suppressants, NSAIDs, sympathomimetics, alcohol, caffeine, licorice, ginseng, St. John's wort, chemo  Sleep Apnea - *** Renal artery stenosis - 03/2020 duplex no stenosis Hyperaldosteronism - *** Hyper/hypothyroidism - 12/2022 TSH 2.28 Pheochromocytoma: CT 06/2023 normal adrenal gland Coarctation of the aorta -carotid duplex 01/2023 right ICA 40 to 59% stenosed, left ICA 1-39% stenosed.,   Past Medical History:  Diagnosis Date   Abnormal Pap smear of vagina    Allergic sinusitis    Anxiety    Atherosclerotic peripheral vascular disease (HCC)    Carotid artery stenosis    Cellulitis    COPD with asthma    not an issue now   Depression    Diabetes mellitus  type 2, uncomplicated (HCC)    DJD (degenerative joint disease)    Elevated cholesterol    Elevated MCV    secondary to alchol   GERD (gastroesophageal reflux disease)    not an issue now.   Gout    Hearing  difficulty    bilateral hearing aids   History of recurrent UTIs    Hypertension    loss weight'off meds now"   Hypothyroidism    IBS (irritable bowel syndrome)    Insomnia    Labial fusion    Osteoarthritis of left knee    Osteopenia    Recurrent vaginitis    Sjogren's syndrome (HCC)    Syncope 11.7.14   secondary to dehydration and possible hypoglycemia   Tendonitis    Thyroid disease    Transfusion history    age 57 "anemia"    Past Surgical History:  Procedure Laterality Date   CATARACT EXTRACTION, BILATERAL Bilateral    CHOLECYSTECTOMY     open   COLONOSCOPY WITH PROPOFOL N/A 03/23/2016   Procedure: COLONOSCOPY WITH PROPOFOL;  Surgeon: Charolett Bumpers, MD;  Location: [facility];  Service: Endoscopy;  Laterality: N/A;   EYE SURGERY     FOOT SURGERY Left    retained hardware   KNEE SURGERY Left    scope    TONSILLECTOMY     TOTAL HIP ARTHROPLASTY Right 03/30/2021   Procedure: RIGHTTOTAL HIP ARTHROPLASTY ANTERIOR APPROACH;  Surgeon: Gean Birchwood, MD;  Location: [facility];  Service: Orthopedics;  Laterality: Right;    Current Medications: Current Meds  Medication Sig   acetaminophen (TYLENOL) 650 MG CR tablet 2 tablets as needed for pain.   amLODipine (NORVASC) 5 MG tablet Take 5 mg by mouth daily.   ascorbic acid (VITAMIN C) 500 MG tablet Take 500 mg by mouth daily.   cetirizine (ZYRTEC ALLERGY) 10 MG tablet as needed for allergies.   Cholecalciferol (VITAMIN D3) 50 MCG (2000 UT) capsule Take 2,000 mg by mouth daily.   clobetasol ointment (TEMOVATE) 0.05 % Apply topically 2 (two) times a week.   cloNIDine (CATAPRES) 0.1 MG tablet Take 0.2 mg by mouth at bedtime.   cloNIDine (CATAPRES) 0.1 MG tablet Take 0.1 mg by mouth daily.   denosumab (PROLIA) 60 MG/ML SOSY injection Inject 60 mg into the skin every 6 (six) months.   diclofenac sodium (VOLTAREN) 1 % GEL Apply 1 application topically 2 (two) times daily as needed (to painful sites).   empagliflozin (JARDIANCE) 10  MG TABS tablet Take by mouth daily.   fluticasone (FLONASE) 50 MCG/ACT nasal spray Place 1 spray into both nostrils daily as needed (for seasonal allergies).   furosemide (LASIX) 20 MG tablet Take 20 mg by mouth every other day.   hypromellose (SYSTANE OVERNIGHT THERAPY) 0.3 % GEL ophthalmic ointment Place 1 application into both eyes at bedtime as needed for dry eyes.   levothyroxine (SYNTHROID) 75 MCG tablet Take 75 mcg by mouth every morning.   meloxicam (MOBIC) 7.5 MG tablet Take by mouth daily as needed.   pantoprazole (PROTONIX) 40 MG tablet Take 40 mg by mouth daily.   Polyvinyl Alcohol-Povidone PF (REFRESH) 1.4-0.6 % SOLN as needed.   RESTASIS 0.05 % ophthalmic emulsion Place 1 drop into both eyes 2 (two) times daily.   rosuvastatin (CRESTOR) 5 MG tablet Take 5 mg by mouth daily.   spironolactone (ALDACTONE) 25 MG tablet Take 1 tablet (25 mg total) by mouth daily.   sucralfate (CARAFATE) 1 g tablet Take 1 g by  mouth 2 (two) times daily.   vitamin E 200 UNIT capsule Take 200 Units by mouth daily.   [DISCONTINUED] furosemide (LASIX) 20 MG tablet Take 1 tablet (20 mg total) by mouth every other day. (Patient taking differently: Take 20 mg by mouth 2 (two) times daily.)     Allergies:   Tape, Celexa [citalopram hydrobromide], Fluconazole, Green tea (camellia sinensis), Green tea leaf ext, Hydralazine hcl, Kenalog [triamcinolone], Levaquin [levofloxacin], Losartan potassium-hctz, Magnesium citrate, Methocarbamol, Nitrofurantoin macrocrystal, Scopolamine, Sulfa antibiotics, Tea, Tramadol, and Trimethoprim   Social History   Socioeconomic History   Marital status: Widowed    Spouse name: Not on file   Number of children: Not on file   Years of education: Not on file   Highest education level: Not on file  Occupational History   Not on file  Tobacco Use   Smoking status: Former    Current packs/day: 0.00    Types: Cigarettes    Quit date: 02/21/1982    Years since quitting: 41.4    Smokeless tobacco: Never  Vaping Use   Vaping status: Never Used  Substance and Sexual Activity   Alcohol use: No   Drug use: No   Sexual activity: Never    Birth control/protection: Post-menopausal  Other Topics Concern   Not on file  Social History Narrative   Not on file   Social Determinants of Health   Financial Resource Strain: Low Risk  (07/15/2023)   Overall Financial Resource Strain (CARDIA)    Difficulty of Paying Living Expenses: Not hard at all  Food Insecurity: No Food Insecurity (07/15/2023)   Hunger Vital Sign    Worried About Running Out of Food in the Last Year: Never true    Ran Out of Food in the Last Year: Never true  Transportation Needs: No Transportation Needs (07/15/2023)   PRAPARE - Administrator, Civil Service (Medical): No    Lack of Transportation (Non-Medical): No  Physical Activity: Inactive (07/15/2023)   Exercise Vital Sign    Days of Exercise per Week: 0 days    Minutes of Exercise per Session: 0 min  Stress: Not on file  Social Connections: Not on file     Family History: The patient's family history includes Breast cancer in her sister; Cancer in her brother and father; Diabetes in her maternal aunt.  ROS:   Please see the history of present illness.     All other systems reviewed and are negative.  EKGs/Labs/Other Studies Reviewed:         Recent Labs: 04/25/2023: Magnesium 2.5 05/01/2023: ALT 18; B Natriuretic Peptide 196.0; Hemoglobin 12.3; Platelets 150 06/30/2023: BUN 19; Creatinine, Ser 1.08; NT-Pro BNP 1,397; Potassium 3.2; Sodium 136   Recent Lipid Panel    Component Value Date/Time   CHOL 149 08/26/2011 0400   TRIG 195 (H) 08/26/2011 0400   HDL 37 (L) 08/26/2011 0400   CHOLHDL 4.0 08/26/2011 0400   VLDL 39 08/26/2011 0400   LDLCALC 73 08/26/2011 0400    Physical Exam:   VS:  BP (!) 166/73 Comment: right arm  Pulse 69   Ht 4\' 10"  (1.473 m)   Wt 116 lb (52.6 kg)   BMI 24.24 kg/m  , BMI Body mass index is  24.24 kg/m. GENERAL:  Well appearing HEENT: Pupils equal round and reactive, fundi not visualized, oral mucosa unremarkable NECK:  No jugular venous distention, waveform within normal limits, carotid upstroke brisk and symmetric, no bruits, no thyromegaly LYMPHATICS:  No  cervical adenopathy LUNGS:  Clear to auscultation bilaterally HEART:  RRR.  PMI not displaced or sustained,S1 and S2 within normal limits, no S3, no S4, no clicks, no rubs, no murmurs ABD:  Flat, positive bowel sounds normal in frequency in pitch, no bruits, no rebound, no guarding, no midline pulsatile mass, no hepatomegaly, no splenomegaly EXT:  2 plus pulses throughout, no edema, no cyanosis no clubbing SKIN:  No rashes no nodules NEURO:  Cranial nerves II through XII grossly intact, motor grossly intact throughout PSYCH:  Cognitively intact, oriented to person place and time   ASSESSMENT/PLAN:    HTN -   HFpEF - Follows with Dr. Izora Ribas.   Aortic atherosclerosis / HLD, LDL goal <70 - 12/2021 LDL 49.   Hx of TIA -   This visit required 50 minutes of face-to-face time by this provider.   Screening for Secondary Hypertension: { Click here to document screening for secondary causes of HTN  :[id number]}    07/15/2023   10:55 AM  Causes  Renovascular HTN Screened     - Comments 2021 renal duplex no stenosis  Sleep Apnea Screened     - Comments sleep study approx 2020 no OSA  Thyroid Disease Screened     - Comments 12/2022 normal TSH  Hyperaldosteronism Not Screened     - Comments low suspicion benefit in testing as already on Spironolactone for HFpEF  Pheochromocytoma Screened     - Comments 01/2023 CT abd with normal adrenal gland    Relevant Labs/Studies:    Latest Ref Rng & Units 06/30/2023   11:42 AM 05/01/2023    4:42 AM 04/25/2023   12:26 PM  Basic Labs  Sodium 134 - 144 mmol/L 136  136  137   Potassium 3.5 - 5.2 mmol/L 3.2  3.2  4.5   Creatinine 0.57 - 1.00 mg/dL 3.08  6.57  8.46         Latest Ref Rng & Units 02/11/2021    2:47 PM 08/25/2011    4:19 PM  Thyroid   TSH 0.350 - 4.500 uIU/mL 1.151  5.232                    Disposition:    FU with MD/PharmD in {gen number [id number]} {Days to years:10300}     Medication Adjustments/Labs and Tests Ordered: Current medicines are reviewed at length with the patient today.  Concerns regarding medicines are outlined above.  No orders of the defined types were placed in this encounter.  No orders of the defined types were placed in this encounter.    Signed, Alver Sorrow, NP  07/15/2023 10:56 AM    [facility]

## 2023-07-15 NOTE — Patient Instructions (Addendum)
Medication Instructions:  Your physician has recommended you make the following change in your medication:    STOP Clonidine tablet  START Clonidine 0.1mg  patch once per week  START Lexazpro 5mg  every evening  Change: Lasix Monday, Wednesday and Friday    Labwork: Your physician recommends that you return for lab work today: BMP, BNP, direct LDL   Testing/Procedures: Your provider has recommended a home sleep study.    Follow-Up: In 2 months with Hypertension Clinic   Referrals:  If you wish to be referred to home health physical therapy or the PREP exercise program at the [facility] simply let us know.    Special Instructions:   Tips to Measure your Blood Pressure Correctly  Here's what you can do to ensure a correct reading:  Don't drink a caffeinated beverage or smoke during the 30 minutes before the test.  Sit quietly for five minutes before the test begins.  During the measurement, sit in a chair with your feet on the floor and your arm supported so your elbow is at about heart level.  The inflatable part of the cuff should completely cover at least 80% of your upper arm, and the cuff should be placed on bare skin, not over a shirt.  Don't talk during the measurement.  Blood pressure categories  Blood pressure category SYSTOLIC (upper number)  DIASTOLIC (lower number)  Normal Less than 120 mm Hg and Less than 80 mm Hg  Elevated 120-129 mm Hg and Less than 80 mm Hg  High blood pressure: Stage 1 hypertension 130-139 mm Hg or 80-89 mm Hg  High blood pressure: Stage 2 hypertension 140 mm Hg or higher or 90 mm Hg or higher  Hypertensive crisis (consult your doctor immediately) Higher than 180 mm Hg and/or Higher than 120 mm Hg  Source: American Heart Association and American Stroke Association. For more on getting your blood pressure under control, buy Controlling Your Blood Pressure, a Special Health Report from [facility].   Blood Pressure Log   Date    Time  Blood Pressure  Example: Nov 1 9 AM 124/78

## 2023-07-17 ENCOUNTER — Encounter (HOSPITAL_BASED_OUTPATIENT_CLINIC_OR_DEPARTMENT_OTHER): Payer: Self-pay | Admitting: Family

## 2023-07-18 ENCOUNTER — Telehealth: Payer: Self-pay | Admitting: Internal Medicine

## 2023-07-18 ENCOUNTER — Telehealth (HOSPITAL_BASED_OUTPATIENT_CLINIC_OR_DEPARTMENT_OTHER): Payer: Self-pay

## 2023-07-18 DIAGNOSIS — R531 Weakness: Secondary | ICD-10-CM | POA: Diagnosis not present

## 2023-07-18 DIAGNOSIS — R5383 Other fatigue: Secondary | ICD-10-CM | POA: Diagnosis not present

## 2023-07-18 DIAGNOSIS — I10 Essential (primary) hypertension: Secondary | ICD-10-CM

## 2023-07-18 MED ORDER — FUROSEMIDE 20 MG PO TABS
20.0000 mg | ORAL_TABLET | Freq: Every day | ORAL | 3 refills | Status: DC
Start: 2023-07-18 — End: 2024-02-03

## 2023-07-18 NOTE — Telephone Encounter (Signed)
Duplicate encounter. See separate phone note 07/18/23.   Alver Sorrow, NP

## 2023-07-18 NOTE — Telephone Encounter (Signed)
Patient and patient's granddaughter called  to verify the results of the patient's electrolytes. They are concerned the patient's potassium is low. Patient's granddaughter stated the patient is feeling lightheadedness most of the time. Patient's granddaughter stated the patient is having to get up 5x a night and the patient is retaining fluid. Please advise.

## 2023-07-18 NOTE — Telephone Encounter (Signed)
Patient is returning call. See previous encounters.

## 2023-07-18 NOTE — Telephone Encounter (Signed)
Will return call to patient tomorrow in previous encounter.

## 2023-07-18 NOTE — Telephone Encounter (Signed)
Via her lab work 07/15/23 her potassium had normalized to 4.0 which is in the middle of normal. Spironolactone helps her body to retain potassium.  Repeat labs previously recommended for 1-2 weeks after making change to Furosemide will allow to reassess potassium level.  It often takes 7-10 days to know how an individual is responding to medication. If she wishes sooner follow up visit that is fine but needs to be in a Hypertension Clinic slot. We have ordered home health who will be able to evaluate promptly within the 1-2 week time period and help to reduce appointment burden.   If she continues to feel unsteady on her feet, cognitively unclear, difficulty eating she also needs to follow up with her primary care provider. These cannot all be contributed to her hypertension. Unsteadiness is more concerning for low blood pressure - we discussed in clinic need to check BP once per day at least an hour after medications.   Alver Sorrow, NP

## 2023-07-18 NOTE — Telephone Encounter (Addendum)
Called patient and spoke with her and her granddaughter. Results reviewed and medication updated, labs order.   Granddaughter states her husband is a doctor and he states that with any change in clonidine patient needs to be reevaluated within two weeks. They are requesting a sooner follow up appointment, maybe even a week. The granddaughter also states that the patient is very unstable. She states she is unstable on her feet, cognitively unclear, having a hard time eating, very tired.   Advised information would be relayed to provider for review.    ----- Message from Alver Sorrow sent at 07/17/2023  6:24 PM EDT ----- Kidney function overall stable. BNP with mild volume overload. Direct LDL at goal. Recommend increasing Furosemide from every other day to daily. Repeat BMP in 1-2 weeks for monitoring .

## 2023-07-18 NOTE — Telephone Encounter (Signed)
Left message for patient to call back     "Via her lab work 07/15/23 her potassium had normalized to 4.0 which is in the middle of normal. Spironolactone helps her body to retain potassium.  Repeat labs previously recommended for 1-2 weeks after making change to Furosemide will allow to reassess potassium level.   It often takes 7-10 days to know how an individual is responding to medication. If she wishes sooner follow up visit that is fine but needs to be in a Hypertension Clinic slot. We have ordered home health who will be able to evaluate promptly within the 1-2 week time period and help to reduce appointment burden.    If she continues to feel unsteady on her feet, cognitively unclear, difficulty eating she also needs to follow up with her primary care provider. These cannot all be contributed to her hypertension. Unsteadiness is more concerning for low blood pressure - we discussed in clinic need to check BP once per day at least an hour after medications.    Alver Sorrow, NP"

## 2023-07-18 NOTE — Telephone Encounter (Signed)
To go with earlier update

## 2023-07-19 NOTE — Telephone Encounter (Signed)
Returned call to patient, reviewed the following recommendations. Patient feels comfortable getting labs done at her PCP, fax number given for sending lab results over once completed.    "Via her lab work 07/15/23 her potassium had normalized to 4.0 which is in the middle of normal. Spironolactone helps her body to retain potassium.  Repeat labs previously recommended for 1-2 weeks after making change to Furosemide will allow to reassess potassium level.   It often takes 7-10 days to know how an individual is responding to medication. If she wishes sooner follow up visit that is fine but needs to be in a Hypertension Clinic slot. We have ordered home health who will be able to evaluate promptly within the 1-2 week time period and help to reduce appointment burden.    If she continues to feel unsteady on her feet, cognitively unclear, difficulty eating she also needs to follow up with her primary care provider. These cannot all be contributed to her hypertension. Unsteadiness is more concerning for low blood pressure - we discussed in clinic need to check BP once per day at least an hour after medications.    Alver Sorrow, NP"

## 2023-07-20 DIAGNOSIS — N189 Chronic kidney disease, unspecified: Secondary | ICD-10-CM | POA: Diagnosis not present

## 2023-07-20 DIAGNOSIS — E1151 Type 2 diabetes mellitus with diabetic peripheral angiopathy without gangrene: Secondary | ICD-10-CM | POA: Diagnosis not present

## 2023-07-20 DIAGNOSIS — E1122 Type 2 diabetes mellitus with diabetic chronic kidney disease: Secondary | ICD-10-CM | POA: Diagnosis not present

## 2023-07-20 DIAGNOSIS — Z791 Long term (current) use of non-steroidal anti-inflammatories (NSAID): Secondary | ICD-10-CM | POA: Diagnosis not present

## 2023-07-20 DIAGNOSIS — I700 Atherosclerosis of aorta: Secondary | ICD-10-CM | POA: Diagnosis not present

## 2023-07-20 DIAGNOSIS — I6523 Occlusion and stenosis of bilateral carotid arteries: Secondary | ICD-10-CM | POA: Diagnosis not present

## 2023-07-20 DIAGNOSIS — Z87891 Personal history of nicotine dependence: Secondary | ICD-10-CM | POA: Diagnosis not present

## 2023-07-20 DIAGNOSIS — M109 Gout, unspecified: Secondary | ICD-10-CM | POA: Diagnosis not present

## 2023-07-20 DIAGNOSIS — I5032 Chronic diastolic (congestive) heart failure: Secondary | ICD-10-CM | POA: Diagnosis not present

## 2023-07-20 DIAGNOSIS — Z556 Problems related to health literacy: Secondary | ICD-10-CM | POA: Diagnosis not present

## 2023-07-20 DIAGNOSIS — I130 Hypertensive heart and chronic kidney disease with heart failure and stage 1 through stage 4 chronic kidney disease, or unspecified chronic kidney disease: Secondary | ICD-10-CM | POA: Diagnosis not present

## 2023-07-20 DIAGNOSIS — J4489 Other specified chronic obstructive pulmonary disease: Secondary | ICD-10-CM | POA: Diagnosis not present

## 2023-07-20 DIAGNOSIS — E039 Hypothyroidism, unspecified: Secondary | ICD-10-CM | POA: Diagnosis not present

## 2023-07-20 DIAGNOSIS — Z8673 Personal history of transient ischemic attack (TIA), and cerebral infarction without residual deficits: Secondary | ICD-10-CM | POA: Diagnosis not present

## 2023-07-20 DIAGNOSIS — Z7984 Long term (current) use of oral hypoglycemic drugs: Secondary | ICD-10-CM | POA: Diagnosis not present

## 2023-07-20 DIAGNOSIS — E7800 Pure hypercholesterolemia, unspecified: Secondary | ICD-10-CM | POA: Diagnosis not present

## 2023-07-22 ENCOUNTER — Telehealth: Payer: Self-pay

## 2023-07-22 NOTE — Telephone Encounter (Signed)
Called to discuss PREP program referral; she shared that she has some health issues and not able to attend the class at this point; asked her to let her provider know when she feels better so they can send in another referral and get her enrolled in class when she is ready and physically able.

## 2023-07-24 DIAGNOSIS — Z8673 Personal history of transient ischemic attack (TIA), and cerebral infarction without residual deficits: Secondary | ICD-10-CM | POA: Diagnosis not present

## 2023-07-24 DIAGNOSIS — Z556 Problems related to health literacy: Secondary | ICD-10-CM | POA: Diagnosis not present

## 2023-07-24 DIAGNOSIS — Z87891 Personal history of nicotine dependence: Secondary | ICD-10-CM | POA: Diagnosis not present

## 2023-07-24 DIAGNOSIS — I5032 Chronic diastolic (congestive) heart failure: Secondary | ICD-10-CM | POA: Diagnosis not present

## 2023-07-24 DIAGNOSIS — N189 Chronic kidney disease, unspecified: Secondary | ICD-10-CM | POA: Diagnosis not present

## 2023-07-24 DIAGNOSIS — I6523 Occlusion and stenosis of bilateral carotid arteries: Secondary | ICD-10-CM | POA: Diagnosis not present

## 2023-07-24 DIAGNOSIS — E1151 Type 2 diabetes mellitus with diabetic peripheral angiopathy without gangrene: Secondary | ICD-10-CM | POA: Diagnosis not present

## 2023-07-24 DIAGNOSIS — E1122 Type 2 diabetes mellitus with diabetic chronic kidney disease: Secondary | ICD-10-CM | POA: Diagnosis not present

## 2023-07-24 DIAGNOSIS — E039 Hypothyroidism, unspecified: Secondary | ICD-10-CM | POA: Diagnosis not present

## 2023-07-24 DIAGNOSIS — I130 Hypertensive heart and chronic kidney disease with heart failure and stage 1 through stage 4 chronic kidney disease, or unspecified chronic kidney disease: Secondary | ICD-10-CM | POA: Diagnosis not present

## 2023-07-24 DIAGNOSIS — Z7984 Long term (current) use of oral hypoglycemic drugs: Secondary | ICD-10-CM | POA: Diagnosis not present

## 2023-07-24 DIAGNOSIS — E7800 Pure hypercholesterolemia, unspecified: Secondary | ICD-10-CM | POA: Diagnosis not present

## 2023-07-24 DIAGNOSIS — Z791 Long term (current) use of non-steroidal anti-inflammatories (NSAID): Secondary | ICD-10-CM | POA: Diagnosis not present

## 2023-07-24 DIAGNOSIS — J4489 Other specified chronic obstructive pulmonary disease: Secondary | ICD-10-CM | POA: Diagnosis not present

## 2023-07-24 DIAGNOSIS — M109 Gout, unspecified: Secondary | ICD-10-CM | POA: Diagnosis not present

## 2023-07-24 DIAGNOSIS — I700 Atherosclerosis of aorta: Secondary | ICD-10-CM | POA: Diagnosis not present

## 2023-07-26 ENCOUNTER — Other Ambulatory Visit: Payer: Self-pay | Admitting: Gastroenterology

## 2023-07-27 DIAGNOSIS — Z791 Long term (current) use of non-steroidal anti-inflammatories (NSAID): Secondary | ICD-10-CM | POA: Diagnosis not present

## 2023-07-27 DIAGNOSIS — Z7984 Long term (current) use of oral hypoglycemic drugs: Secondary | ICD-10-CM | POA: Diagnosis not present

## 2023-07-27 DIAGNOSIS — Z8673 Personal history of transient ischemic attack (TIA), and cerebral infarction without residual deficits: Secondary | ICD-10-CM | POA: Diagnosis not present

## 2023-07-27 DIAGNOSIS — I5032 Chronic diastolic (congestive) heart failure: Secondary | ICD-10-CM | POA: Diagnosis not present

## 2023-07-27 DIAGNOSIS — E1151 Type 2 diabetes mellitus with diabetic peripheral angiopathy without gangrene: Secondary | ICD-10-CM | POA: Diagnosis not present

## 2023-07-27 DIAGNOSIS — E7800 Pure hypercholesterolemia, unspecified: Secondary | ICD-10-CM | POA: Diagnosis not present

## 2023-07-27 DIAGNOSIS — M109 Gout, unspecified: Secondary | ICD-10-CM | POA: Diagnosis not present

## 2023-07-27 DIAGNOSIS — E039 Hypothyroidism, unspecified: Secondary | ICD-10-CM | POA: Diagnosis not present

## 2023-07-27 DIAGNOSIS — J4489 Other specified chronic obstructive pulmonary disease: Secondary | ICD-10-CM | POA: Diagnosis not present

## 2023-07-27 DIAGNOSIS — E1122 Type 2 diabetes mellitus with diabetic chronic kidney disease: Secondary | ICD-10-CM | POA: Diagnosis not present

## 2023-07-27 DIAGNOSIS — Z556 Problems related to health literacy: Secondary | ICD-10-CM | POA: Diagnosis not present

## 2023-07-27 DIAGNOSIS — I700 Atherosclerosis of aorta: Secondary | ICD-10-CM | POA: Diagnosis not present

## 2023-07-27 DIAGNOSIS — I130 Hypertensive heart and chronic kidney disease with heart failure and stage 1 through stage 4 chronic kidney disease, or unspecified chronic kidney disease: Secondary | ICD-10-CM | POA: Diagnosis not present

## 2023-07-27 DIAGNOSIS — N189 Chronic kidney disease, unspecified: Secondary | ICD-10-CM | POA: Diagnosis not present

## 2023-07-27 DIAGNOSIS — Z87891 Personal history of nicotine dependence: Secondary | ICD-10-CM | POA: Diagnosis not present

## 2023-07-27 DIAGNOSIS — I6523 Occlusion and stenosis of bilateral carotid arteries: Secondary | ICD-10-CM | POA: Diagnosis not present

## 2023-07-28 DIAGNOSIS — M109 Gout, unspecified: Secondary | ICD-10-CM | POA: Diagnosis not present

## 2023-07-28 DIAGNOSIS — I130 Hypertensive heart and chronic kidney disease with heart failure and stage 1 through stage 4 chronic kidney disease, or unspecified chronic kidney disease: Secondary | ICD-10-CM | POA: Diagnosis not present

## 2023-07-28 DIAGNOSIS — N189 Chronic kidney disease, unspecified: Secondary | ICD-10-CM | POA: Diagnosis not present

## 2023-07-28 DIAGNOSIS — E1122 Type 2 diabetes mellitus with diabetic chronic kidney disease: Secondary | ICD-10-CM | POA: Diagnosis not present

## 2023-07-28 DIAGNOSIS — Z791 Long term (current) use of non-steroidal anti-inflammatories (NSAID): Secondary | ICD-10-CM | POA: Diagnosis not present

## 2023-07-28 DIAGNOSIS — E7800 Pure hypercholesterolemia, unspecified: Secondary | ICD-10-CM | POA: Diagnosis not present

## 2023-07-28 DIAGNOSIS — Z556 Problems related to health literacy: Secondary | ICD-10-CM | POA: Diagnosis not present

## 2023-07-28 DIAGNOSIS — I5032 Chronic diastolic (congestive) heart failure: Secondary | ICD-10-CM | POA: Diagnosis not present

## 2023-07-28 DIAGNOSIS — Z87891 Personal history of nicotine dependence: Secondary | ICD-10-CM | POA: Diagnosis not present

## 2023-07-28 DIAGNOSIS — I700 Atherosclerosis of aorta: Secondary | ICD-10-CM | POA: Diagnosis not present

## 2023-07-28 DIAGNOSIS — J4489 Other specified chronic obstructive pulmonary disease: Secondary | ICD-10-CM | POA: Diagnosis not present

## 2023-07-28 DIAGNOSIS — I6523 Occlusion and stenosis of bilateral carotid arteries: Secondary | ICD-10-CM | POA: Diagnosis not present

## 2023-07-28 DIAGNOSIS — E039 Hypothyroidism, unspecified: Secondary | ICD-10-CM | POA: Diagnosis not present

## 2023-07-28 DIAGNOSIS — E1151 Type 2 diabetes mellitus with diabetic peripheral angiopathy without gangrene: Secondary | ICD-10-CM | POA: Diagnosis not present

## 2023-07-28 DIAGNOSIS — Z8673 Personal history of transient ischemic attack (TIA), and cerebral infarction without residual deficits: Secondary | ICD-10-CM | POA: Diagnosis not present

## 2023-07-28 DIAGNOSIS — Z7984 Long term (current) use of oral hypoglycemic drugs: Secondary | ICD-10-CM | POA: Diagnosis not present

## 2023-08-01 DIAGNOSIS — Z791 Long term (current) use of non-steroidal anti-inflammatories (NSAID): Secondary | ICD-10-CM | POA: Diagnosis not present

## 2023-08-01 DIAGNOSIS — Z8673 Personal history of transient ischemic attack (TIA), and cerebral infarction without residual deficits: Secondary | ICD-10-CM | POA: Diagnosis not present

## 2023-08-01 DIAGNOSIS — I130 Hypertensive heart and chronic kidney disease with heart failure and stage 1 through stage 4 chronic kidney disease, or unspecified chronic kidney disease: Secondary | ICD-10-CM | POA: Diagnosis not present

## 2023-08-01 DIAGNOSIS — I700 Atherosclerosis of aorta: Secondary | ICD-10-CM | POA: Diagnosis not present

## 2023-08-01 DIAGNOSIS — E039 Hypothyroidism, unspecified: Secondary | ICD-10-CM | POA: Diagnosis not present

## 2023-08-01 DIAGNOSIS — E7800 Pure hypercholesterolemia, unspecified: Secondary | ICD-10-CM | POA: Diagnosis not present

## 2023-08-01 DIAGNOSIS — I5032 Chronic diastolic (congestive) heart failure: Secondary | ICD-10-CM | POA: Diagnosis not present

## 2023-08-01 DIAGNOSIS — E1151 Type 2 diabetes mellitus with diabetic peripheral angiopathy without gangrene: Secondary | ICD-10-CM | POA: Diagnosis not present

## 2023-08-01 DIAGNOSIS — E1122 Type 2 diabetes mellitus with diabetic chronic kidney disease: Secondary | ICD-10-CM | POA: Diagnosis not present

## 2023-08-01 DIAGNOSIS — M109 Gout, unspecified: Secondary | ICD-10-CM | POA: Diagnosis not present

## 2023-08-01 DIAGNOSIS — I6523 Occlusion and stenosis of bilateral carotid arteries: Secondary | ICD-10-CM | POA: Diagnosis not present

## 2023-08-01 DIAGNOSIS — Z7984 Long term (current) use of oral hypoglycemic drugs: Secondary | ICD-10-CM | POA: Diagnosis not present

## 2023-08-01 DIAGNOSIS — N189 Chronic kidney disease, unspecified: Secondary | ICD-10-CM | POA: Diagnosis not present

## 2023-08-01 DIAGNOSIS — J4489 Other specified chronic obstructive pulmonary disease: Secondary | ICD-10-CM | POA: Diagnosis not present

## 2023-08-01 DIAGNOSIS — Z556 Problems related to health literacy: Secondary | ICD-10-CM | POA: Diagnosis not present

## 2023-08-01 DIAGNOSIS — Z87891 Personal history of nicotine dependence: Secondary | ICD-10-CM | POA: Diagnosis not present

## 2023-08-02 DIAGNOSIS — R413 Other amnesia: Secondary | ICD-10-CM | POA: Diagnosis not present

## 2023-08-02 DIAGNOSIS — Z8673 Personal history of transient ischemic attack (TIA), and cerebral infarction without residual deficits: Secondary | ICD-10-CM | POA: Diagnosis not present

## 2023-08-02 DIAGNOSIS — J4489 Other specified chronic obstructive pulmonary disease: Secondary | ICD-10-CM | POA: Diagnosis not present

## 2023-08-02 DIAGNOSIS — Z7984 Long term (current) use of oral hypoglycemic drugs: Secondary | ICD-10-CM | POA: Diagnosis not present

## 2023-08-02 DIAGNOSIS — E039 Hypothyroidism, unspecified: Secondary | ICD-10-CM | POA: Diagnosis not present

## 2023-08-02 DIAGNOSIS — R5383 Other fatigue: Secondary | ICD-10-CM | POA: Diagnosis not present

## 2023-08-02 DIAGNOSIS — E7800 Pure hypercholesterolemia, unspecified: Secondary | ICD-10-CM | POA: Diagnosis not present

## 2023-08-02 DIAGNOSIS — I130 Hypertensive heart and chronic kidney disease with heart failure and stage 1 through stage 4 chronic kidney disease, or unspecified chronic kidney disease: Secondary | ICD-10-CM | POA: Diagnosis not present

## 2023-08-02 DIAGNOSIS — E1122 Type 2 diabetes mellitus with diabetic chronic kidney disease: Secondary | ICD-10-CM | POA: Diagnosis not present

## 2023-08-02 DIAGNOSIS — N189 Chronic kidney disease, unspecified: Secondary | ICD-10-CM | POA: Diagnosis not present

## 2023-08-02 DIAGNOSIS — I5032 Chronic diastolic (congestive) heart failure: Secondary | ICD-10-CM | POA: Diagnosis not present

## 2023-08-02 DIAGNOSIS — M109 Gout, unspecified: Secondary | ICD-10-CM | POA: Diagnosis not present

## 2023-08-02 DIAGNOSIS — R531 Weakness: Secondary | ICD-10-CM | POA: Diagnosis not present

## 2023-08-02 DIAGNOSIS — Z87891 Personal history of nicotine dependence: Secondary | ICD-10-CM | POA: Diagnosis not present

## 2023-08-02 DIAGNOSIS — E1151 Type 2 diabetes mellitus with diabetic peripheral angiopathy without gangrene: Secondary | ICD-10-CM | POA: Diagnosis not present

## 2023-08-02 DIAGNOSIS — Z791 Long term (current) use of non-steroidal anti-inflammatories (NSAID): Secondary | ICD-10-CM | POA: Diagnosis not present

## 2023-08-02 DIAGNOSIS — I6523 Occlusion and stenosis of bilateral carotid arteries: Secondary | ICD-10-CM | POA: Diagnosis not present

## 2023-08-02 DIAGNOSIS — I700 Atherosclerosis of aorta: Secondary | ICD-10-CM | POA: Diagnosis not present

## 2023-08-02 DIAGNOSIS — Z556 Problems related to health literacy: Secondary | ICD-10-CM | POA: Diagnosis not present

## 2023-08-02 DIAGNOSIS — Z9989 Dependence on other enabling machines and devices: Secondary | ICD-10-CM | POA: Diagnosis not present

## 2023-08-03 ENCOUNTER — Other Ambulatory Visit (HOSPITAL_BASED_OUTPATIENT_CLINIC_OR_DEPARTMENT_OTHER): Payer: Self-pay | Admitting: Internal Medicine

## 2023-08-03 ENCOUNTER — Ambulatory Visit (HOSPITAL_BASED_OUTPATIENT_CLINIC_OR_DEPARTMENT_OTHER)
Admission: RE | Admit: 2023-08-03 | Discharge: 2023-08-03 | Disposition: A | Discharge status: Home / Self Care | Payer: Medicare Other | Source: Ambulatory Visit | Attending: Internal Medicine | Admitting: Internal Medicine

## 2023-08-03 DIAGNOSIS — M109 Gout, unspecified: Secondary | ICD-10-CM | POA: Diagnosis not present

## 2023-08-03 DIAGNOSIS — R531 Weakness: Secondary | ICD-10-CM | POA: Insufficient documentation

## 2023-08-03 DIAGNOSIS — Z87891 Personal history of nicotine dependence: Secondary | ICD-10-CM | POA: Diagnosis not present

## 2023-08-03 DIAGNOSIS — N189 Chronic kidney disease, unspecified: Secondary | ICD-10-CM | POA: Diagnosis not present

## 2023-08-03 DIAGNOSIS — E1122 Type 2 diabetes mellitus with diabetic chronic kidney disease: Secondary | ICD-10-CM | POA: Diagnosis not present

## 2023-08-03 DIAGNOSIS — Z556 Problems related to health literacy: Secondary | ICD-10-CM | POA: Diagnosis not present

## 2023-08-03 DIAGNOSIS — E7800 Pure hypercholesterolemia, unspecified: Secondary | ICD-10-CM | POA: Diagnosis not present

## 2023-08-03 DIAGNOSIS — J4489 Other specified chronic obstructive pulmonary disease: Secondary | ICD-10-CM | POA: Diagnosis not present

## 2023-08-03 DIAGNOSIS — R413 Other amnesia: Secondary | ICD-10-CM | POA: Diagnosis not present

## 2023-08-03 DIAGNOSIS — I130 Hypertensive heart and chronic kidney disease with heart failure and stage 1 through stage 4 chronic kidney disease, or unspecified chronic kidney disease: Secondary | ICD-10-CM | POA: Diagnosis not present

## 2023-08-03 DIAGNOSIS — E1151 Type 2 diabetes mellitus with diabetic peripheral angiopathy without gangrene: Secondary | ICD-10-CM | POA: Diagnosis not present

## 2023-08-03 DIAGNOSIS — R42 Dizziness and giddiness: Secondary | ICD-10-CM | POA: Diagnosis not present

## 2023-08-03 DIAGNOSIS — I700 Atherosclerosis of aorta: Secondary | ICD-10-CM | POA: Diagnosis not present

## 2023-08-03 DIAGNOSIS — Z8673 Personal history of transient ischemic attack (TIA), and cerebral infarction without residual deficits: Secondary | ICD-10-CM | POA: Diagnosis not present

## 2023-08-03 DIAGNOSIS — I6782 Cerebral ischemia: Secondary | ICD-10-CM | POA: Diagnosis not present

## 2023-08-03 DIAGNOSIS — I5032 Chronic diastolic (congestive) heart failure: Secondary | ICD-10-CM | POA: Diagnosis not present

## 2023-08-03 DIAGNOSIS — Z7984 Long term (current) use of oral hypoglycemic drugs: Secondary | ICD-10-CM | POA: Diagnosis not present

## 2023-08-03 DIAGNOSIS — E039 Hypothyroidism, unspecified: Secondary | ICD-10-CM | POA: Diagnosis not present

## 2023-08-03 DIAGNOSIS — Z791 Long term (current) use of non-steroidal anti-inflammatories (NSAID): Secondary | ICD-10-CM | POA: Diagnosis not present

## 2023-08-03 DIAGNOSIS — I6523 Occlusion and stenosis of bilateral carotid arteries: Secondary | ICD-10-CM | POA: Diagnosis not present

## 2023-08-09 ENCOUNTER — Ambulatory Visit (HOSPITAL_COMMUNITY): Admit: 2023-08-09 | Payer: Medicare Other | Admitting: Gastroenterology

## 2023-08-09 ENCOUNTER — Encounter (HOSPITAL_COMMUNITY): Payer: Self-pay

## 2023-08-09 SURGERY — ESOPHAGOGASTRODUODENOSCOPY (EGD) WITH PROPOFOL
Anesthesia: Monitor Anesthesia Care

## 2023-08-10 DIAGNOSIS — J4489 Other specified chronic obstructive pulmonary disease: Secondary | ICD-10-CM | POA: Diagnosis not present

## 2023-08-10 DIAGNOSIS — Z791 Long term (current) use of non-steroidal anti-inflammatories (NSAID): Secondary | ICD-10-CM | POA: Diagnosis not present

## 2023-08-10 DIAGNOSIS — E7800 Pure hypercholesterolemia, unspecified: Secondary | ICD-10-CM | POA: Diagnosis not present

## 2023-08-10 DIAGNOSIS — I6523 Occlusion and stenosis of bilateral carotid arteries: Secondary | ICD-10-CM | POA: Diagnosis not present

## 2023-08-10 DIAGNOSIS — Z87891 Personal history of nicotine dependence: Secondary | ICD-10-CM | POA: Diagnosis not present

## 2023-08-10 DIAGNOSIS — Z7984 Long term (current) use of oral hypoglycemic drugs: Secondary | ICD-10-CM | POA: Diagnosis not present

## 2023-08-10 DIAGNOSIS — I130 Hypertensive heart and chronic kidney disease with heart failure and stage 1 through stage 4 chronic kidney disease, or unspecified chronic kidney disease: Secondary | ICD-10-CM | POA: Diagnosis not present

## 2023-08-10 DIAGNOSIS — N189 Chronic kidney disease, unspecified: Secondary | ICD-10-CM | POA: Diagnosis not present

## 2023-08-10 DIAGNOSIS — E1151 Type 2 diabetes mellitus with diabetic peripheral angiopathy without gangrene: Secondary | ICD-10-CM | POA: Diagnosis not present

## 2023-08-10 DIAGNOSIS — Z8673 Personal history of transient ischemic attack (TIA), and cerebral infarction without residual deficits: Secondary | ICD-10-CM | POA: Diagnosis not present

## 2023-08-10 DIAGNOSIS — I5032 Chronic diastolic (congestive) heart failure: Secondary | ICD-10-CM | POA: Diagnosis not present

## 2023-08-10 DIAGNOSIS — E1122 Type 2 diabetes mellitus with diabetic chronic kidney disease: Secondary | ICD-10-CM | POA: Diagnosis not present

## 2023-08-10 DIAGNOSIS — M109 Gout, unspecified: Secondary | ICD-10-CM | POA: Diagnosis not present

## 2023-08-10 DIAGNOSIS — I700 Atherosclerosis of aorta: Secondary | ICD-10-CM | POA: Diagnosis not present

## 2023-08-10 DIAGNOSIS — E039 Hypothyroidism, unspecified: Secondary | ICD-10-CM | POA: Diagnosis not present

## 2023-08-10 DIAGNOSIS — Z556 Problems related to health literacy: Secondary | ICD-10-CM | POA: Diagnosis not present

## 2023-08-12 DIAGNOSIS — N189 Chronic kidney disease, unspecified: Secondary | ICD-10-CM | POA: Diagnosis not present

## 2023-08-12 DIAGNOSIS — E1122 Type 2 diabetes mellitus with diabetic chronic kidney disease: Secondary | ICD-10-CM | POA: Diagnosis not present

## 2023-08-12 DIAGNOSIS — E1151 Type 2 diabetes mellitus with diabetic peripheral angiopathy without gangrene: Secondary | ICD-10-CM | POA: Diagnosis not present

## 2023-08-12 DIAGNOSIS — I6523 Occlusion and stenosis of bilateral carotid arteries: Secondary | ICD-10-CM | POA: Diagnosis not present

## 2023-08-12 DIAGNOSIS — I700 Atherosclerosis of aorta: Secondary | ICD-10-CM | POA: Diagnosis not present

## 2023-08-12 DIAGNOSIS — Z87891 Personal history of nicotine dependence: Secondary | ICD-10-CM | POA: Diagnosis not present

## 2023-08-12 DIAGNOSIS — E039 Hypothyroidism, unspecified: Secondary | ICD-10-CM | POA: Diagnosis not present

## 2023-08-12 DIAGNOSIS — Z7984 Long term (current) use of oral hypoglycemic drugs: Secondary | ICD-10-CM | POA: Diagnosis not present

## 2023-08-12 DIAGNOSIS — E7800 Pure hypercholesterolemia, unspecified: Secondary | ICD-10-CM | POA: Diagnosis not present

## 2023-08-12 DIAGNOSIS — Z8673 Personal history of transient ischemic attack (TIA), and cerebral infarction without residual deficits: Secondary | ICD-10-CM | POA: Diagnosis not present

## 2023-08-12 DIAGNOSIS — Z791 Long term (current) use of non-steroidal anti-inflammatories (NSAID): Secondary | ICD-10-CM | POA: Diagnosis not present

## 2023-08-12 DIAGNOSIS — M109 Gout, unspecified: Secondary | ICD-10-CM | POA: Diagnosis not present

## 2023-08-12 DIAGNOSIS — I130 Hypertensive heart and chronic kidney disease with heart failure and stage 1 through stage 4 chronic kidney disease, or unspecified chronic kidney disease: Secondary | ICD-10-CM | POA: Diagnosis not present

## 2023-08-12 DIAGNOSIS — I5032 Chronic diastolic (congestive) heart failure: Secondary | ICD-10-CM | POA: Diagnosis not present

## 2023-08-12 DIAGNOSIS — J4489 Other specified chronic obstructive pulmonary disease: Secondary | ICD-10-CM | POA: Diagnosis not present

## 2023-08-12 DIAGNOSIS — Z556 Problems related to health literacy: Secondary | ICD-10-CM | POA: Diagnosis not present

## 2023-08-13 DIAGNOSIS — I700 Atherosclerosis of aorta: Secondary | ICD-10-CM | POA: Diagnosis not present

## 2023-08-13 DIAGNOSIS — Z791 Long term (current) use of non-steroidal anti-inflammatories (NSAID): Secondary | ICD-10-CM | POA: Diagnosis not present

## 2023-08-13 DIAGNOSIS — Z556 Problems related to health literacy: Secondary | ICD-10-CM | POA: Diagnosis not present

## 2023-08-13 DIAGNOSIS — I130 Hypertensive heart and chronic kidney disease with heart failure and stage 1 through stage 4 chronic kidney disease, or unspecified chronic kidney disease: Secondary | ICD-10-CM | POA: Diagnosis not present

## 2023-08-13 DIAGNOSIS — E7800 Pure hypercholesterolemia, unspecified: Secondary | ICD-10-CM | POA: Diagnosis not present

## 2023-08-13 DIAGNOSIS — N189 Chronic kidney disease, unspecified: Secondary | ICD-10-CM | POA: Diagnosis not present

## 2023-08-13 DIAGNOSIS — I6523 Occlusion and stenosis of bilateral carotid arteries: Secondary | ICD-10-CM | POA: Diagnosis not present

## 2023-08-13 DIAGNOSIS — I5032 Chronic diastolic (congestive) heart failure: Secondary | ICD-10-CM | POA: Diagnosis not present

## 2023-08-13 DIAGNOSIS — J4489 Other specified chronic obstructive pulmonary disease: Secondary | ICD-10-CM | POA: Diagnosis not present

## 2023-08-13 DIAGNOSIS — Z7984 Long term (current) use of oral hypoglycemic drugs: Secondary | ICD-10-CM | POA: Diagnosis not present

## 2023-08-13 DIAGNOSIS — Z87891 Personal history of nicotine dependence: Secondary | ICD-10-CM | POA: Diagnosis not present

## 2023-08-13 DIAGNOSIS — E1122 Type 2 diabetes mellitus with diabetic chronic kidney disease: Secondary | ICD-10-CM | POA: Diagnosis not present

## 2023-08-13 DIAGNOSIS — M109 Gout, unspecified: Secondary | ICD-10-CM | POA: Diagnosis not present

## 2023-08-13 DIAGNOSIS — E039 Hypothyroidism, unspecified: Secondary | ICD-10-CM | POA: Diagnosis not present

## 2023-08-13 DIAGNOSIS — Z8673 Personal history of transient ischemic attack (TIA), and cerebral infarction without residual deficits: Secondary | ICD-10-CM | POA: Diagnosis not present

## 2023-08-13 DIAGNOSIS — E1151 Type 2 diabetes mellitus with diabetic peripheral angiopathy without gangrene: Secondary | ICD-10-CM | POA: Diagnosis not present

## 2023-08-15 DIAGNOSIS — I5032 Chronic diastolic (congestive) heart failure: Secondary | ICD-10-CM | POA: Diagnosis not present

## 2023-08-15 DIAGNOSIS — N189 Chronic kidney disease, unspecified: Secondary | ICD-10-CM | POA: Diagnosis not present

## 2023-08-15 DIAGNOSIS — Z791 Long term (current) use of non-steroidal anti-inflammatories (NSAID): Secondary | ICD-10-CM | POA: Diagnosis not present

## 2023-08-15 DIAGNOSIS — E7800 Pure hypercholesterolemia, unspecified: Secondary | ICD-10-CM | POA: Diagnosis not present

## 2023-08-15 DIAGNOSIS — E1122 Type 2 diabetes mellitus with diabetic chronic kidney disease: Secondary | ICD-10-CM | POA: Diagnosis not present

## 2023-08-15 DIAGNOSIS — E1151 Type 2 diabetes mellitus with diabetic peripheral angiopathy without gangrene: Secondary | ICD-10-CM | POA: Diagnosis not present

## 2023-08-15 DIAGNOSIS — Z8673 Personal history of transient ischemic attack (TIA), and cerebral infarction without residual deficits: Secondary | ICD-10-CM | POA: Diagnosis not present

## 2023-08-15 DIAGNOSIS — I130 Hypertensive heart and chronic kidney disease with heart failure and stage 1 through stage 4 chronic kidney disease, or unspecified chronic kidney disease: Secondary | ICD-10-CM | POA: Diagnosis not present

## 2023-08-15 DIAGNOSIS — E039 Hypothyroidism, unspecified: Secondary | ICD-10-CM | POA: Diagnosis not present

## 2023-08-15 DIAGNOSIS — Z556 Problems related to health literacy: Secondary | ICD-10-CM | POA: Diagnosis not present

## 2023-08-15 DIAGNOSIS — I700 Atherosclerosis of aorta: Secondary | ICD-10-CM | POA: Diagnosis not present

## 2023-08-15 DIAGNOSIS — Z87891 Personal history of nicotine dependence: Secondary | ICD-10-CM | POA: Diagnosis not present

## 2023-08-15 DIAGNOSIS — M109 Gout, unspecified: Secondary | ICD-10-CM | POA: Diagnosis not present

## 2023-08-15 DIAGNOSIS — J4489 Other specified chronic obstructive pulmonary disease: Secondary | ICD-10-CM | POA: Diagnosis not present

## 2023-08-15 DIAGNOSIS — I6523 Occlusion and stenosis of bilateral carotid arteries: Secondary | ICD-10-CM | POA: Diagnosis not present

## 2023-08-15 DIAGNOSIS — Z7984 Long term (current) use of oral hypoglycemic drugs: Secondary | ICD-10-CM | POA: Diagnosis not present

## 2023-08-19 ENCOUNTER — Other Ambulatory Visit (HOSPITAL_BASED_OUTPATIENT_CLINIC_OR_DEPARTMENT_OTHER): Payer: Self-pay | Admitting: Family

## 2023-08-19 DIAGNOSIS — F419 Anxiety disorder, unspecified: Secondary | ICD-10-CM

## 2023-08-20 DIAGNOSIS — Z87891 Personal history of nicotine dependence: Secondary | ICD-10-CM | POA: Diagnosis not present

## 2023-08-20 DIAGNOSIS — Z791 Long term (current) use of non-steroidal anti-inflammatories (NSAID): Secondary | ICD-10-CM | POA: Diagnosis not present

## 2023-08-20 DIAGNOSIS — I6523 Occlusion and stenosis of bilateral carotid arteries: Secondary | ICD-10-CM | POA: Diagnosis not present

## 2023-08-20 DIAGNOSIS — I700 Atherosclerosis of aorta: Secondary | ICD-10-CM | POA: Diagnosis not present

## 2023-08-20 DIAGNOSIS — E1122 Type 2 diabetes mellitus with diabetic chronic kidney disease: Secondary | ICD-10-CM | POA: Diagnosis not present

## 2023-08-20 DIAGNOSIS — Z7984 Long term (current) use of oral hypoglycemic drugs: Secondary | ICD-10-CM | POA: Diagnosis not present

## 2023-08-20 DIAGNOSIS — E1151 Type 2 diabetes mellitus with diabetic peripheral angiopathy without gangrene: Secondary | ICD-10-CM | POA: Diagnosis not present

## 2023-08-20 DIAGNOSIS — N189 Chronic kidney disease, unspecified: Secondary | ICD-10-CM | POA: Diagnosis not present

## 2023-08-20 DIAGNOSIS — I5032 Chronic diastolic (congestive) heart failure: Secondary | ICD-10-CM | POA: Diagnosis not present

## 2023-08-20 DIAGNOSIS — Z556 Problems related to health literacy: Secondary | ICD-10-CM | POA: Diagnosis not present

## 2023-08-20 DIAGNOSIS — Z8673 Personal history of transient ischemic attack (TIA), and cerebral infarction without residual deficits: Secondary | ICD-10-CM | POA: Diagnosis not present

## 2023-08-20 DIAGNOSIS — J4489 Other specified chronic obstructive pulmonary disease: Secondary | ICD-10-CM | POA: Diagnosis not present

## 2023-08-20 DIAGNOSIS — E039 Hypothyroidism, unspecified: Secondary | ICD-10-CM | POA: Diagnosis not present

## 2023-08-20 DIAGNOSIS — I130 Hypertensive heart and chronic kidney disease with heart failure and stage 1 through stage 4 chronic kidney disease, or unspecified chronic kidney disease: Secondary | ICD-10-CM | POA: Diagnosis not present

## 2023-08-20 DIAGNOSIS — E7800 Pure hypercholesterolemia, unspecified: Secondary | ICD-10-CM | POA: Diagnosis not present

## 2023-08-20 DIAGNOSIS — M109 Gout, unspecified: Secondary | ICD-10-CM | POA: Diagnosis not present

## 2023-08-24 DIAGNOSIS — I6523 Occlusion and stenosis of bilateral carotid arteries: Secondary | ICD-10-CM | POA: Diagnosis not present

## 2023-08-24 DIAGNOSIS — I130 Hypertensive heart and chronic kidney disease with heart failure and stage 1 through stage 4 chronic kidney disease, or unspecified chronic kidney disease: Secondary | ICD-10-CM | POA: Diagnosis not present

## 2023-08-24 DIAGNOSIS — Z87891 Personal history of nicotine dependence: Secondary | ICD-10-CM | POA: Diagnosis not present

## 2023-08-24 DIAGNOSIS — E039 Hypothyroidism, unspecified: Secondary | ICD-10-CM | POA: Diagnosis not present

## 2023-08-24 DIAGNOSIS — I700 Atherosclerosis of aorta: Secondary | ICD-10-CM | POA: Diagnosis not present

## 2023-08-24 DIAGNOSIS — E1122 Type 2 diabetes mellitus with diabetic chronic kidney disease: Secondary | ICD-10-CM | POA: Diagnosis not present

## 2023-08-24 DIAGNOSIS — E7800 Pure hypercholesterolemia, unspecified: Secondary | ICD-10-CM | POA: Diagnosis not present

## 2023-08-24 DIAGNOSIS — Z556 Problems related to health literacy: Secondary | ICD-10-CM | POA: Diagnosis not present

## 2023-08-24 DIAGNOSIS — I5032 Chronic diastolic (congestive) heart failure: Secondary | ICD-10-CM | POA: Diagnosis not present

## 2023-08-24 DIAGNOSIS — M109 Gout, unspecified: Secondary | ICD-10-CM | POA: Diagnosis not present

## 2023-08-24 DIAGNOSIS — E1151 Type 2 diabetes mellitus with diabetic peripheral angiopathy without gangrene: Secondary | ICD-10-CM | POA: Diagnosis not present

## 2023-08-24 DIAGNOSIS — Z791 Long term (current) use of non-steroidal anti-inflammatories (NSAID): Secondary | ICD-10-CM | POA: Diagnosis not present

## 2023-08-24 DIAGNOSIS — N189 Chronic kidney disease, unspecified: Secondary | ICD-10-CM | POA: Diagnosis not present

## 2023-08-24 DIAGNOSIS — Z8673 Personal history of transient ischemic attack (TIA), and cerebral infarction without residual deficits: Secondary | ICD-10-CM | POA: Diagnosis not present

## 2023-08-24 DIAGNOSIS — J4489 Other specified chronic obstructive pulmonary disease: Secondary | ICD-10-CM | POA: Diagnosis not present

## 2023-08-24 DIAGNOSIS — Z7984 Long term (current) use of oral hypoglycemic drugs: Secondary | ICD-10-CM | POA: Diagnosis not present

## 2023-08-26 DIAGNOSIS — Z8673 Personal history of transient ischemic attack (TIA), and cerebral infarction without residual deficits: Secondary | ICD-10-CM | POA: Diagnosis not present

## 2023-08-26 DIAGNOSIS — E7800 Pure hypercholesterolemia, unspecified: Secondary | ICD-10-CM | POA: Diagnosis not present

## 2023-08-26 DIAGNOSIS — E1151 Type 2 diabetes mellitus with diabetic peripheral angiopathy without gangrene: Secondary | ICD-10-CM | POA: Diagnosis not present

## 2023-08-26 DIAGNOSIS — N189 Chronic kidney disease, unspecified: Secondary | ICD-10-CM | POA: Diagnosis not present

## 2023-08-26 DIAGNOSIS — Z791 Long term (current) use of non-steroidal anti-inflammatories (NSAID): Secondary | ICD-10-CM | POA: Diagnosis not present

## 2023-08-26 DIAGNOSIS — I6523 Occlusion and stenosis of bilateral carotid arteries: Secondary | ICD-10-CM | POA: Diagnosis not present

## 2023-08-26 DIAGNOSIS — Z9989 Dependence on other enabling machines and devices: Secondary | ICD-10-CM | POA: Diagnosis not present

## 2023-08-26 DIAGNOSIS — I130 Hypertensive heart and chronic kidney disease with heart failure and stage 1 through stage 4 chronic kidney disease, or unspecified chronic kidney disease: Secondary | ICD-10-CM | POA: Diagnosis not present

## 2023-08-26 DIAGNOSIS — J4489 Other specified chronic obstructive pulmonary disease: Secondary | ICD-10-CM | POA: Diagnosis not present

## 2023-08-26 DIAGNOSIS — E113212 Type 2 diabetes mellitus with mild nonproliferative diabetic retinopathy with macular edema, left eye: Secondary | ICD-10-CM | POA: Diagnosis not present

## 2023-08-26 DIAGNOSIS — Z556 Problems related to health literacy: Secondary | ICD-10-CM | POA: Diagnosis not present

## 2023-08-26 DIAGNOSIS — Z7984 Long term (current) use of oral hypoglycemic drugs: Secondary | ICD-10-CM | POA: Diagnosis not present

## 2023-08-26 DIAGNOSIS — I700 Atherosclerosis of aorta: Secondary | ICD-10-CM | POA: Diagnosis not present

## 2023-08-26 DIAGNOSIS — R29898 Other symptoms and signs involving the musculoskeletal system: Secondary | ICD-10-CM | POA: Diagnosis not present

## 2023-08-26 DIAGNOSIS — I5032 Chronic diastolic (congestive) heart failure: Secondary | ICD-10-CM | POA: Diagnosis not present

## 2023-08-26 DIAGNOSIS — Z87891 Personal history of nicotine dependence: Secondary | ICD-10-CM | POA: Diagnosis not present

## 2023-08-26 DIAGNOSIS — E039 Hypothyroidism, unspecified: Secondary | ICD-10-CM | POA: Diagnosis not present

## 2023-08-26 DIAGNOSIS — E1122 Type 2 diabetes mellitus with diabetic chronic kidney disease: Secondary | ICD-10-CM | POA: Diagnosis not present

## 2023-08-26 DIAGNOSIS — M109 Gout, unspecified: Secondary | ICD-10-CM | POA: Diagnosis not present

## 2023-08-29 ENCOUNTER — Other Ambulatory Visit: Payer: Self-pay | Admitting: Internal Medicine

## 2023-08-29 DIAGNOSIS — R4189 Other symptoms and signs involving cognitive functions and awareness: Secondary | ICD-10-CM

## 2023-09-03 DIAGNOSIS — N189 Chronic kidney disease, unspecified: Secondary | ICD-10-CM | POA: Diagnosis not present

## 2023-09-03 DIAGNOSIS — Z556 Problems related to health literacy: Secondary | ICD-10-CM | POA: Diagnosis not present

## 2023-09-03 DIAGNOSIS — I5032 Chronic diastolic (congestive) heart failure: Secondary | ICD-10-CM | POA: Diagnosis not present

## 2023-09-03 DIAGNOSIS — Z87891 Personal history of nicotine dependence: Secondary | ICD-10-CM | POA: Diagnosis not present

## 2023-09-03 DIAGNOSIS — Z791 Long term (current) use of non-steroidal anti-inflammatories (NSAID): Secondary | ICD-10-CM | POA: Diagnosis not present

## 2023-09-03 DIAGNOSIS — J4489 Other specified chronic obstructive pulmonary disease: Secondary | ICD-10-CM | POA: Diagnosis not present

## 2023-09-03 DIAGNOSIS — I6523 Occlusion and stenosis of bilateral carotid arteries: Secondary | ICD-10-CM | POA: Diagnosis not present

## 2023-09-03 DIAGNOSIS — E7800 Pure hypercholesterolemia, unspecified: Secondary | ICD-10-CM | POA: Diagnosis not present

## 2023-09-03 DIAGNOSIS — E1151 Type 2 diabetes mellitus with diabetic peripheral angiopathy without gangrene: Secondary | ICD-10-CM | POA: Diagnosis not present

## 2023-09-03 DIAGNOSIS — M109 Gout, unspecified: Secondary | ICD-10-CM | POA: Diagnosis not present

## 2023-09-03 DIAGNOSIS — Z8673 Personal history of transient ischemic attack (TIA), and cerebral infarction without residual deficits: Secondary | ICD-10-CM | POA: Diagnosis not present

## 2023-09-03 DIAGNOSIS — E1122 Type 2 diabetes mellitus with diabetic chronic kidney disease: Secondary | ICD-10-CM | POA: Diagnosis not present

## 2023-09-03 DIAGNOSIS — Z7984 Long term (current) use of oral hypoglycemic drugs: Secondary | ICD-10-CM | POA: Diagnosis not present

## 2023-09-03 DIAGNOSIS — I700 Atherosclerosis of aorta: Secondary | ICD-10-CM | POA: Diagnosis not present

## 2023-09-03 DIAGNOSIS — E039 Hypothyroidism, unspecified: Secondary | ICD-10-CM | POA: Diagnosis not present

## 2023-09-03 DIAGNOSIS — I130 Hypertensive heart and chronic kidney disease with heart failure and stage 1 through stage 4 chronic kidney disease, or unspecified chronic kidney disease: Secondary | ICD-10-CM | POA: Diagnosis not present

## 2023-09-07 DIAGNOSIS — E7800 Pure hypercholesterolemia, unspecified: Secondary | ICD-10-CM | POA: Diagnosis not present

## 2023-09-07 DIAGNOSIS — J4489 Other specified chronic obstructive pulmonary disease: Secondary | ICD-10-CM | POA: Diagnosis not present

## 2023-09-07 DIAGNOSIS — E1151 Type 2 diabetes mellitus with diabetic peripheral angiopathy without gangrene: Secondary | ICD-10-CM | POA: Diagnosis not present

## 2023-09-07 DIAGNOSIS — Z8673 Personal history of transient ischemic attack (TIA), and cerebral infarction without residual deficits: Secondary | ICD-10-CM | POA: Diagnosis not present

## 2023-09-07 DIAGNOSIS — Z7984 Long term (current) use of oral hypoglycemic drugs: Secondary | ICD-10-CM | POA: Diagnosis not present

## 2023-09-07 DIAGNOSIS — I130 Hypertensive heart and chronic kidney disease with heart failure and stage 1 through stage 4 chronic kidney disease, or unspecified chronic kidney disease: Secondary | ICD-10-CM | POA: Diagnosis not present

## 2023-09-07 DIAGNOSIS — E1122 Type 2 diabetes mellitus with diabetic chronic kidney disease: Secondary | ICD-10-CM | POA: Diagnosis not present

## 2023-09-07 DIAGNOSIS — N189 Chronic kidney disease, unspecified: Secondary | ICD-10-CM | POA: Diagnosis not present

## 2023-09-07 DIAGNOSIS — I5032 Chronic diastolic (congestive) heart failure: Secondary | ICD-10-CM | POA: Diagnosis not present

## 2023-09-07 DIAGNOSIS — I6523 Occlusion and stenosis of bilateral carotid arteries: Secondary | ICD-10-CM | POA: Diagnosis not present

## 2023-09-07 DIAGNOSIS — M109 Gout, unspecified: Secondary | ICD-10-CM | POA: Diagnosis not present

## 2023-09-07 DIAGNOSIS — Z87891 Personal history of nicotine dependence: Secondary | ICD-10-CM | POA: Diagnosis not present

## 2023-09-07 DIAGNOSIS — I700 Atherosclerosis of aorta: Secondary | ICD-10-CM | POA: Diagnosis not present

## 2023-09-07 DIAGNOSIS — E039 Hypothyroidism, unspecified: Secondary | ICD-10-CM | POA: Diagnosis not present

## 2023-09-07 DIAGNOSIS — Z556 Problems related to health literacy: Secondary | ICD-10-CM | POA: Diagnosis not present

## 2023-09-07 DIAGNOSIS — Z791 Long term (current) use of non-steroidal anti-inflammatories (NSAID): Secondary | ICD-10-CM | POA: Diagnosis not present

## 2023-09-09 DIAGNOSIS — Z791 Long term (current) use of non-steroidal anti-inflammatories (NSAID): Secondary | ICD-10-CM | POA: Diagnosis not present

## 2023-09-09 DIAGNOSIS — I130 Hypertensive heart and chronic kidney disease with heart failure and stage 1 through stage 4 chronic kidney disease, or unspecified chronic kidney disease: Secondary | ICD-10-CM | POA: Diagnosis not present

## 2023-09-09 DIAGNOSIS — E7800 Pure hypercholesterolemia, unspecified: Secondary | ICD-10-CM | POA: Diagnosis not present

## 2023-09-09 DIAGNOSIS — N189 Chronic kidney disease, unspecified: Secondary | ICD-10-CM | POA: Diagnosis not present

## 2023-09-09 DIAGNOSIS — J4489 Other specified chronic obstructive pulmonary disease: Secondary | ICD-10-CM | POA: Diagnosis not present

## 2023-09-09 DIAGNOSIS — I5032 Chronic diastolic (congestive) heart failure: Secondary | ICD-10-CM | POA: Diagnosis not present

## 2023-09-09 DIAGNOSIS — Z7984 Long term (current) use of oral hypoglycemic drugs: Secondary | ICD-10-CM | POA: Diagnosis not present

## 2023-09-09 DIAGNOSIS — E1151 Type 2 diabetes mellitus with diabetic peripheral angiopathy without gangrene: Secondary | ICD-10-CM | POA: Diagnosis not present

## 2023-09-09 DIAGNOSIS — M109 Gout, unspecified: Secondary | ICD-10-CM | POA: Diagnosis not present

## 2023-09-09 DIAGNOSIS — I700 Atherosclerosis of aorta: Secondary | ICD-10-CM | POA: Diagnosis not present

## 2023-09-09 DIAGNOSIS — Z556 Problems related to health literacy: Secondary | ICD-10-CM | POA: Diagnosis not present

## 2023-09-09 DIAGNOSIS — E039 Hypothyroidism, unspecified: Secondary | ICD-10-CM | POA: Diagnosis not present

## 2023-09-09 DIAGNOSIS — E1122 Type 2 diabetes mellitus with diabetic chronic kidney disease: Secondary | ICD-10-CM | POA: Diagnosis not present

## 2023-09-09 DIAGNOSIS — Z87891 Personal history of nicotine dependence: Secondary | ICD-10-CM | POA: Diagnosis not present

## 2023-09-09 DIAGNOSIS — I6523 Occlusion and stenosis of bilateral carotid arteries: Secondary | ICD-10-CM | POA: Diagnosis not present

## 2023-09-09 DIAGNOSIS — Z8673 Personal history of transient ischemic attack (TIA), and cerebral infarction without residual deficits: Secondary | ICD-10-CM | POA: Diagnosis not present

## 2023-09-15 DIAGNOSIS — Z556 Problems related to health literacy: Secondary | ICD-10-CM | POA: Diagnosis not present

## 2023-09-15 DIAGNOSIS — M109 Gout, unspecified: Secondary | ICD-10-CM | POA: Diagnosis not present

## 2023-09-15 DIAGNOSIS — Z791 Long term (current) use of non-steroidal anti-inflammatories (NSAID): Secondary | ICD-10-CM | POA: Diagnosis not present

## 2023-09-15 DIAGNOSIS — Z7984 Long term (current) use of oral hypoglycemic drugs: Secondary | ICD-10-CM | POA: Diagnosis not present

## 2023-09-15 DIAGNOSIS — E1151 Type 2 diabetes mellitus with diabetic peripheral angiopathy without gangrene: Secondary | ICD-10-CM | POA: Diagnosis not present

## 2023-09-15 DIAGNOSIS — Z87891 Personal history of nicotine dependence: Secondary | ICD-10-CM | POA: Diagnosis not present

## 2023-09-15 DIAGNOSIS — I130 Hypertensive heart and chronic kidney disease with heart failure and stage 1 through stage 4 chronic kidney disease, or unspecified chronic kidney disease: Secondary | ICD-10-CM | POA: Diagnosis not present

## 2023-09-15 DIAGNOSIS — N189 Chronic kidney disease, unspecified: Secondary | ICD-10-CM | POA: Diagnosis not present

## 2023-09-15 DIAGNOSIS — Z8673 Personal history of transient ischemic attack (TIA), and cerebral infarction without residual deficits: Secondary | ICD-10-CM | POA: Diagnosis not present

## 2023-09-15 DIAGNOSIS — E7800 Pure hypercholesterolemia, unspecified: Secondary | ICD-10-CM | POA: Diagnosis not present

## 2023-09-15 DIAGNOSIS — I700 Atherosclerosis of aorta: Secondary | ICD-10-CM | POA: Diagnosis not present

## 2023-09-15 DIAGNOSIS — J4489 Other specified chronic obstructive pulmonary disease: Secondary | ICD-10-CM | POA: Diagnosis not present

## 2023-09-15 DIAGNOSIS — I6523 Occlusion and stenosis of bilateral carotid arteries: Secondary | ICD-10-CM | POA: Diagnosis not present

## 2023-09-15 DIAGNOSIS — E1122 Type 2 diabetes mellitus with diabetic chronic kidney disease: Secondary | ICD-10-CM | POA: Diagnosis not present

## 2023-09-15 DIAGNOSIS — E039 Hypothyroidism, unspecified: Secondary | ICD-10-CM | POA: Diagnosis not present

## 2023-09-15 DIAGNOSIS — I5032 Chronic diastolic (congestive) heart failure: Secondary | ICD-10-CM | POA: Diagnosis not present

## 2023-09-16 DIAGNOSIS — Z791 Long term (current) use of non-steroidal anti-inflammatories (NSAID): Secondary | ICD-10-CM | POA: Diagnosis not present

## 2023-09-16 DIAGNOSIS — Z8673 Personal history of transient ischemic attack (TIA), and cerebral infarction without residual deficits: Secondary | ICD-10-CM | POA: Diagnosis not present

## 2023-09-16 DIAGNOSIS — I700 Atherosclerosis of aorta: Secondary | ICD-10-CM | POA: Diagnosis not present

## 2023-09-16 DIAGNOSIS — Z7984 Long term (current) use of oral hypoglycemic drugs: Secondary | ICD-10-CM | POA: Diagnosis not present

## 2023-09-16 DIAGNOSIS — M109 Gout, unspecified: Secondary | ICD-10-CM | POA: Diagnosis not present

## 2023-09-16 DIAGNOSIS — I5032 Chronic diastolic (congestive) heart failure: Secondary | ICD-10-CM | POA: Diagnosis not present

## 2023-09-16 DIAGNOSIS — J4489 Other specified chronic obstructive pulmonary disease: Secondary | ICD-10-CM | POA: Diagnosis not present

## 2023-09-16 DIAGNOSIS — E1122 Type 2 diabetes mellitus with diabetic chronic kidney disease: Secondary | ICD-10-CM | POA: Diagnosis not present

## 2023-09-16 DIAGNOSIS — N189 Chronic kidney disease, unspecified: Secondary | ICD-10-CM | POA: Diagnosis not present

## 2023-09-16 DIAGNOSIS — E7800 Pure hypercholesterolemia, unspecified: Secondary | ICD-10-CM | POA: Diagnosis not present

## 2023-09-16 DIAGNOSIS — I130 Hypertensive heart and chronic kidney disease with heart failure and stage 1 through stage 4 chronic kidney disease, or unspecified chronic kidney disease: Secondary | ICD-10-CM | POA: Diagnosis not present

## 2023-09-16 DIAGNOSIS — I6523 Occlusion and stenosis of bilateral carotid arteries: Secondary | ICD-10-CM | POA: Diagnosis not present

## 2023-09-16 DIAGNOSIS — E039 Hypothyroidism, unspecified: Secondary | ICD-10-CM | POA: Diagnosis not present

## 2023-09-16 DIAGNOSIS — E1151 Type 2 diabetes mellitus with diabetic peripheral angiopathy without gangrene: Secondary | ICD-10-CM | POA: Diagnosis not present

## 2023-09-16 DIAGNOSIS — Z556 Problems related to health literacy: Secondary | ICD-10-CM | POA: Diagnosis not present

## 2023-09-16 DIAGNOSIS — Z87891 Personal history of nicotine dependence: Secondary | ICD-10-CM | POA: Diagnosis not present

## 2023-09-19 DIAGNOSIS — M810 Age-related osteoporosis without current pathological fracture: Secondary | ICD-10-CM | POA: Diagnosis not present

## 2023-09-22 ENCOUNTER — Encounter (HOSPITAL_BASED_OUTPATIENT_CLINIC_OR_DEPARTMENT_OTHER): Payer: Self-pay | Admitting: Family

## 2023-09-22 ENCOUNTER — Ambulatory Visit (HOSPITAL_BASED_OUTPATIENT_CLINIC_OR_DEPARTMENT_OTHER): Payer: Medicare Other | Admitting: Family

## 2023-09-22 ENCOUNTER — Telehealth (HOSPITAL_BASED_OUTPATIENT_CLINIC_OR_DEPARTMENT_OTHER): Payer: Self-pay | Admitting: Family

## 2023-09-22 VITALS — BP 135/72 | HR 65 | Ht <= 58 in | Wt 111.5 lb

## 2023-09-22 DIAGNOSIS — I10 Essential (primary) hypertension: Secondary | ICD-10-CM | POA: Diagnosis not present

## 2023-09-22 DIAGNOSIS — R5381 Other malaise: Secondary | ICD-10-CM

## 2023-09-22 DIAGNOSIS — R0683 Snoring: Secondary | ICD-10-CM | POA: Diagnosis not present

## 2023-09-22 DIAGNOSIS — I5032 Chronic diastolic (congestive) heart failure: Secondary | ICD-10-CM | POA: Diagnosis not present

## 2023-09-22 NOTE — Progress Notes (Signed)
[facility] Assessment:    Date:  09/22/2023   ID:  Christina Sellers, DOB [birth year], MRN [id number]  PCP:  Thana Ates, MD  Cardiologist:  [name], MD  Nephrologist:  Referring MD: Thana Ates, MD   CC: Hypertension  History of Present Illness:    Christina Sellers is a 82 y.o. female with a hx of hypertension, HFpEF, diabetes, aortic atherosclerosis, PAC, SVT carotid stenosis, CKD, HLD here to follow up in the [facility].   Prior cardiac testing includes: Sleep study approx 2020 with no sleep apnea. Renal duplex 03/2020 with no significant RAS. Echocardiogram 5//23 LVEF 60 to 65%, gr1DD, RV normal, LA moderately dilated. Carotid duplex 01/2023 right ICA 40-59% stenosed, left ICA 1 to 39% stenosis. CT abdomen 03/2023 with normal adrenal gland. Monitor 05/2023 predominantly normal sinus rhythm average heart rate 69 bpm, 1 4 beat run of NSVT and one 11 beat run of slow SVT with artifact.  Triggered and diary events associated with NSR. Of noite, adrenal insufficiency previously ruled out with normal ACTH.   Seen by Dr. Raynelle Jan 06/30/2023 recommended to continue Lasix, SGLT2, MRA for diastolic heart failure.  She was not amenable to a repeat-trial of beta-blocker. Recommended to establish with Hypertension Clinic for consideration of research trial vs RDN due to multiple prior intolerances and poor control of BP causing diastolic heart failure exacerbations.   Initial [facility] visit 07/15/23. Clonidine changed from 0.2mg  QHS to 0.1mg  patch due to concern for rebound hypertension. She has been hesitant regarding medications given prior hyoptension leading to falls. HH RN and PT were ordered. Given information on renal denervation, but was hesitant regarding procedures. Due to snoring, daytime somnolence, sleep study ordered. She was given 30 day course of Lexapro 5mg  as had previously tolerated due to anxiety and  encouraged to follow up with PCP.   Presents today for follow up independently. Completed HH PT/RN. Brings BP log from home with readings routinely 130s/60s. She had two isolated episodes of SBP 150s one on the day a friend passed away and another on the day of the funeral. Anticipate stress contributory. Reports she is no longer taking Clonidine patch nor tablet. No reported adverse effects. Endorses compliance with Amlodipine, Lasix, Spironolactone. Reports no shortness of breath nor dyspnea on exertion. Reports no chest pain, pressure, or tightness. No edema, orthopnea, PND. Reports no palpitations.     Previous antihypertensives: HCTZ-near-syncope CCB-near-syncope (high-dose) Hydralazine-leg swelling and bruising Losartan-hydrochlorothiazide - night sweats/fatigue Valsartan - unclear why stopped, last documented 2012  Past Medical History:  Diagnosis Date   Abnormal Pap smear of vagina    Allergic sinusitis    Anxiety    Atherosclerotic peripheral vascular disease (HCC)    Carotid artery stenosis    Cellulitis    COPD with asthma    not an issue now   Depression    Diabetes mellitus type 2, uncomplicated (HCC)    DJD (degenerative joint disease)    Elevated cholesterol    Elevated MCV    secondary to alchol   GERD (gastroesophageal reflux disease)    not an issue now.   Gout    Hearing difficulty    bilateral hearing aids   History of recurrent UTIs    Hypertension    loss weight'off meds now"   Hypothyroidism    IBS (irritable bowel syndrome)    Insomnia    Labial fusion    Osteoarthritis of left knee    Osteopenia  Recurrent vaginitis    Sjogren's syndrome (HCC)    Syncope 11.7.14   secondary to dehydration and possible hypoglycemia   Tendonitis    Thyroid disease    Transfusion history    age 60 "anemia"    Past Surgical History:  Procedure Laterality Date   CATARACT EXTRACTION, BILATERAL Bilateral    CHOLECYSTECTOMY     open   COLONOSCOPY WITH  PROPOFOL N/A 03/23/2016   Procedure: COLONOSCOPY WITH PROPOFOL;  Surgeon: Charolett Bumpers, MD;  Location: [facility];  Service: Endoscopy;  Laterality: N/A;   EYE SURGERY     FOOT SURGERY Left    retained hardware   KNEE SURGERY Left    scope    TONSILLECTOMY     TOTAL HIP ARTHROPLASTY Right 03/30/2021   Procedure: RIGHTTOTAL HIP ARTHROPLASTY ANTERIOR APPROACH;  Surgeon: Gean Birchwood, MD;  Location: [facility];  Service: Orthopedics;  Laterality: Right;    Current Medications: No outpatient medications have been marked as taking for the 09/22/23 encounter (Appointment) with Alver Sorrow, NP.     Allergies:   Tape, Celexa [citalopram hydrobromide], Fluconazole, Green tea (camellia sinensis), Green tea leaf ext, Hydralazine hcl, Kenalog [triamcinolone], Levaquin [levofloxacin], Losartan potassium-hctz, Magnesium citrate, Methocarbamol, Nitrofurantoin macrocrystal, Scopolamine, Sulfa antibiotics, Tea, Tramadol, and Trimethoprim   Social History   Socioeconomic History   Marital status: Widowed    Spouse name: Not on file   Number of children: Not on file   Years of education: Not on file   Highest education level: Not on file  Occupational History   Not on file  Tobacco Use   Smoking status: Former    Current packs/day: 0.00    Types: Cigarettes    Quit date: 02/21/1982    Years since quitting: 41.6   Smokeless tobacco: Never  Vaping Use   Vaping status: Never Used  Substance and Sexual Activity   Alcohol use: No   Drug use: No   Sexual activity: Never    Birth control/protection: Post-menopausal  Other Topics Concern   Not on file  Social History Narrative   Not on file   Social Determinants of Health   Financial Resource Strain: Low Risk  (07/15/2023)   Overall Financial Resource Strain (CARDIA)    Difficulty of Paying Living Expenses: Not hard at all  Food Insecurity: No Food Insecurity (07/15/2023)   Hunger Vital Sign    Worried About Running Out of Food in the  Last Year: Never true    Ran Out of Food in the Last Year: Never true  Transportation Needs: No Transportation Needs (07/15/2023)   PRAPARE - Administrator, Civil Service (Medical): No    Lack of Transportation (Non-Medical): No  Physical Activity: Inactive (07/15/2023)   Exercise Vital Sign    Days of Exercise per Week: 0 days    Minutes of Exercise per Session: 0 min  Stress: Not on file  Social Connections: Not on file     Family History: The patient's family history includes Breast cancer in her sister; Cancer in her brother and father; Diabetes in her maternal aunt.  ROS:   Please see the history of present illness.     All other systems reviewed and are negative.  EKGs/Labs/Other Studies Reviewed:         Recent Labs: 04/25/2023: Magnesium 2.5 05/01/2023: ALT 18; Hemoglobin 12.3; Platelets 150 06/30/2023: NT-Pro BNP 1,397 07/15/2023: BNP 200.9; BUN 23; Creatinine, Ser 1.29; Potassium 4.0; Sodium 137   Recent Lipid Panel  Component Value Date/Time   CHOL 149 08/26/2011 0400   TRIG 195 (H) 08/26/2011 0400   HDL 37 (L) 08/26/2011 0400   CHOLHDL 4.0 08/26/2011 0400   VLDL 39 08/26/2011 0400   LDLCALC 73 08/26/2011 0400   LDLDIRECT 65 07/15/2023 1321    Physical Exam:   VS:  There were no vitals taken for this visit. , BMI There is no height or weight on file to calculate BMI. GENERAL:  Well appearing HEENT: Pupils equal round and reactive, fundi not visualized, oral mucosa unremarkable NECK:  No jugular venous distention, waveform within normal limits, carotid upstroke brisk and symmetric, no bruits, no thyromegaly LYMPHATICS:  No cervical adenopathy LUNGS:  Clear to auscultation bilaterally HEART:  RRR.  PMI not displaced or sustained,S1 and S2 within normal limits, no S3, no S4, no clicks, no rubs, no murmurs ABD:  Flat, positive bowel sounds normal in frequency in pitch, no bruits, no rebound, no guarding, no midline pulsatile mass, no hepatomegaly, no  splenomegaly EXT:  2 plus pulses throughout, no edema, no cyanosis no clubbing SKIN:  No rashes no nodules NEURO:  Cranial nerves II through XII grossly intact, motor grossly intact throughout PSYCH:  Cognitively intact, oriented to person place and time   ASSESSMENT/PLAN:    HTN - Hesitant regarding medications. Prior intolerances (hydrochlorothiazide, CCB, Hydralazine, Losartan, Valsartan) detailed above.  Reports no longer using Clonidine patch. Unclear why or when discontinued. Clonidine 0.2mg  at bedtime previously stopped, would avoid daily dosing of Clonidine due to propensity for rebound hypertension. BP at home routinely 130s/60s with two isolated SBP 150s on day of a friend passing and funeral. Stress contributory to isolated elevated BP. As BP relatively controlled and to prevent hypotension due to previous dizziness, continue current regimen  Lasix 20mg  daily, Spironolactone 25mg  daily, Amlodipine 5mg  daily. Her intolerance to Losartan/Valsartan is unclear. Could consider Entresto in future if needed for dual HTN/HFpEF benefit.  Previously given information on RDN, procedure presently on hold. Not candidate for Lourena Simmonds trial as she is on Spironolactone.  HFpEF - Follows with Dr. Izora Ribas. Euvolemic and well compensated on exam.  Weight down 6 lbs over last 2 months. Low sodium diet, fluid restriction <2L, and daily weights encouraged. Educated to contact our office for weight gain of 2 lbs overnight or 5 lbs in one week.   Physical deconditioning - Completed to [facility]. Refer to PREP exercise program.  Anxiety - Continue to follow with PCP.   Daytime somnolence / Snoring - . STOP Bang 4. Home Sleep study previously ordered 08/04/23, awaiting scheduling.   Aortic atherosclerosis / Hx of TIA / HLD, LDL goal <70 -  Continue Rosuvastatin 5mg  daily.   Screening for Secondary Hypertension:     07/15/2023   10:55 AM  Causes  Renovascular HTN Screened     - Comments 2021 renal duplex  no stenosis  Sleep Apnea Screened     - Comments sleep study approx 2020 no OSA  Thyroid Disease Screened     - Comments 12/2022 normal TSH  Hyperaldosteronism Not Screened     - Comments low suspicion benefit in testing as already on Spironolactone for HFpEF  Pheochromocytoma Screened     - Comments 01/2023 CT abd with normal adrenal gland    Relevant Labs/Studies:    Latest Ref Rng & Units 07/15/2023    1:21 PM 06/30/2023   11:42 AM 05/01/2023    4:42 AM  Basic Labs  Sodium 134 - 144 mmol/L 137  136  136   Potassium 3.5 - 5.2 mmol/L 4.0  3.2  3.2   Creatinine 0.57 - 1.00 mg/dL 1.61  0.96  0.45        Latest Ref Rng & Units 02/11/2021    2:47 PM 08/25/2011    4:19 PM  Thyroid   TSH 0.350 - 4.500 uIU/mL 1.151  5.232                    Disposition:    FU with Dr. Georgia Duff as scheduled and with [facility] Clinic in 6 months     Medication Adjustments/Labs and Tests Ordered: Current medicines are reviewed at length with the patient today.  Concerns regarding medicines are outlined above.  No orders of the defined types were placed in this encounter.  No orders of the defined types were placed in this encounter.    Signed, Alver Sorrow, NP  09/22/2023 1:06 PM    [facility]

## 2023-09-22 NOTE — Patient Instructions (Signed)
Medication Instructions:  Continue your current medications.     Follow-Up: Follow up with Dr. Ronette Deter as scheduled  AND  In 6 months with [facility] Hypertension Clinic     Special Instructions:

## 2023-09-22 NOTE — Telephone Encounter (Signed)
Called her son after her office visit per his request. Okay per DPR. He 3-way-called in his wife who is Char[employer] fundraiser.   Reviewed BP in clinic 135/72. BP at home overall controlled 130s/60s. Reassurance provided.   In clinic visit she stated she was not taking Clonidine patch, they are uncertain stating "who knows what she takes" and will check her medications and contact us if Clonidine needs to be added back to BP.  Short term memory concerns x 90 days. Would recommend discussion with PCP.   Reviewed diastolic heart failure pathophysiology, medications, lifestyle recommendations and prognosis in detail.  Will refer to PREP exercise program per their request  Alver Sorrow, NP

## 2023-09-27 ENCOUNTER — Telehealth: Payer: Self-pay

## 2023-09-27 NOTE — Telephone Encounter (Signed)
Called re: PREP program referral; left voicemail requesting call back on daughter's cell phone.

## 2023-09-29 ENCOUNTER — Other Ambulatory Visit: Payer: Medicare Other

## 2023-09-29 ENCOUNTER — Encounter: Payer: Self-pay | Admitting: Diagnostic Neuroimaging

## 2023-09-30 ENCOUNTER — Other Ambulatory Visit: Payer: Medicare Other

## 2023-10-01 ENCOUNTER — Other Ambulatory Visit (HOSPITAL_BASED_OUTPATIENT_CLINIC_OR_DEPARTMENT_OTHER): Payer: Self-pay | Admitting: Family

## 2023-10-01 DIAGNOSIS — F419 Anxiety disorder, unspecified: Secondary | ICD-10-CM

## 2023-10-03 ENCOUNTER — Ambulatory Visit
Admission: RE | Admit: 2023-10-03 | Discharge: 2023-10-03 | Disposition: A | Discharge status: Home / Self Care | Payer: Medicare Other | Source: Ambulatory Visit | Attending: Internal Medicine | Admitting: Internal Medicine

## 2023-10-03 DIAGNOSIS — I6782 Cerebral ischemia: Secondary | ICD-10-CM | POA: Diagnosis not present

## 2023-10-03 DIAGNOSIS — G319 Degenerative disease of nervous system, unspecified: Secondary | ICD-10-CM | POA: Diagnosis not present

## 2023-10-03 DIAGNOSIS — R4189 Other symptoms and signs involving cognitive functions and awareness: Secondary | ICD-10-CM

## 2023-10-06 ENCOUNTER — Other Ambulatory Visit (HOSPITAL_BASED_OUTPATIENT_CLINIC_OR_DEPARTMENT_OTHER): Payer: Self-pay | Admitting: Family

## 2023-10-06 DIAGNOSIS — F419 Anxiety disorder, unspecified: Secondary | ICD-10-CM

## 2023-10-14 ENCOUNTER — Ambulatory Visit: Payer: Medicare Other | Attending: Internal Medicine | Admitting: Internal Medicine

## 2023-10-14 ENCOUNTER — Encounter: Payer: Self-pay | Admitting: Internal Medicine

## 2023-10-14 VITALS — BP 122/50 | HR 54 | Ht <= 58 in | Wt 113.0 lb

## 2023-10-14 DIAGNOSIS — E1159 Type 2 diabetes mellitus with other circulatory complications: Secondary | ICD-10-CM

## 2023-10-14 DIAGNOSIS — I6523 Occlusion and stenosis of bilateral carotid arteries: Secondary | ICD-10-CM

## 2023-10-14 DIAGNOSIS — J42 Unspecified chronic bronchitis: Secondary | ICD-10-CM | POA: Diagnosis not present

## 2023-10-14 DIAGNOSIS — I5032 Chronic diastolic (congestive) heart failure: Secondary | ICD-10-CM | POA: Diagnosis not present

## 2023-10-14 DIAGNOSIS — R011 Cardiac murmur, unspecified: Secondary | ICD-10-CM

## 2023-10-14 DIAGNOSIS — I471 Supraventricular tachycardia, unspecified: Secondary | ICD-10-CM

## 2023-10-14 DIAGNOSIS — I152 Hypertension secondary to endocrine disorders: Secondary | ICD-10-CM

## 2023-10-14 DIAGNOSIS — I700 Atherosclerosis of aorta: Secondary | ICD-10-CM | POA: Diagnosis not present

## 2023-10-14 NOTE — Progress Notes (Signed)
Cardiology Office Note:    Date:  10/14/2023   ID:  Christina Sellers, DOB 1941/07/04, MRN [id number]  PCP:  Thana Ates, MD  Provider Notes:  Hearing Impaired   [facility]  Cardiologist:  [name], MD  Advanced Practice Provider:  No care team member to display Electrophysiologist:  None      CC: Follow up hospistalization  History of Present Illness:    Christina Sellers is a 82 y.o. female with a hx of PAD (CAS), hx of obstructive lung disease, HLD with T2DM with aortic atherosclerosis, HTN with DM who presented for evaluation 01/21/21. 2022: Had syncope issues including syncope during an office visit.  Seen in the hospital during that admission and her diuretics and BB were stopped due to intolerance. Had right hip surgery 03/30/21.  Has done better of CCB and thiazide 2023: TR improved.  BP high.  Had issues with hydralazine and increased norvasc with Dr. Kevan Ny. Dr. Kevan Ny retired.  Moved to Dr. Margaretann Loveless.  I started low dose MRA.  2024: Started on SGLT2i.  Did not want to start MRA increase; creatinine increased ~ 20% with increase.  Has June ED visit for chest pain- thought to be pericarditis.   Had [facility] admission for chest pain.  Negative work up. Saw HTN clinic.  BP is worse.   Christina Sellers an 82 year old individual with a complex medical history including hypertension, heart failure with preserved ejection fraction, diabetes, aortic atherosclerosis, hyperlipidemia, CKD stage three, tricuspid regurgitation, and carotid stenosis, has been under my care since 2022.  In the summer of 2024, she underwent multiple evaluations due to medication nonadherence, heart failure exacerbation, hypertension evaluation, and volume overload.  She has started using a walker since 2024, but her strength has reportedly increased. She has minimal lower extremity swelling and is classified as NYHA class I.  She feels back to normal.  I am meeting the  other of her twin daughters today, who are cautiously optimistic about her lack of symptoms.  She has a history of TIA and hyperlipidemia, with her LDL goal being less than 70, which she meets on rosuvastatin. She is asymptomatic of her premature atrial contractions and supraventricular tachycardia. She also has mild carotid artery stenosis and is on aspirin for this indication.  There are concerns about significant memory loss and potential early signs of dementia. The patient's daughters have been more involved in her care, which has improved medication adherence, as have pill packs. Despite these challenges, the patient is currently the most compensated she has been since 2024.   Allergies:   Tape, Celexa [citalopram hydrobromide], Fluconazole, Green tea (camellia sinensis), Green tea leaf ext, Hydralazine hcl, Kenalog [triamcinolone], Levaquin [levofloxacin], Losartan potassium-hctz, Magnesium citrate, Methocarbamol, Nitrofurantoin macrocrystal, Scopolamine, Sulfa antibiotics, Tea, Tramadol, and Trimethoprim   Social History   Socioeconomic History   Marital status: Widowed    Spouse name: Not on file   Number of children: Not on file   Years of education: Not on file   Highest education level: Not on file  Occupational History   Not on file  Tobacco Use   Smoking status: Former    Current packs/day: 0.00    Types: Cigarettes    Quit date: 02/21/1982    Years since quitting: 41.6   Smokeless tobacco: Never  Vaping Use   Vaping status: Never Used  Substance and Sexual Activity   Alcohol use: No   Drug use: No   Sexual activity:  Never    Birth control/protection: Post-menopausal  Other Topics Concern   Not on file  Social History Narrative   Not on file   Social Determinants of Health   Financial Resource Strain: Low Risk  (07/15/2023)   Overall Financial Resource Strain (CARDIA)    Difficulty of Paying Living Expenses: Not hard at all  Food Insecurity: No Food Insecurity  (07/15/2023)   Hunger Vital Sign    Worried About Running Out of Food in the Last Year: Never true    Ran Out of Food in the Last Year: Never true  Transportation Needs: No Transportation Needs (07/15/2023)   PRAPARE - Administrator, Civil Service (Medical): No    Lack of Transportation (Non-Medical): No  Physical Activity: Inactive (07/15/2023)   Exercise Vital Sign    Days of Exercise per Week: 0 days    Minutes of Exercise per Session: 0 min  Stress: Not on file  Social Connections: Not on file    Social:  Daughter is in [place] and had two kids' she has multiple family members one who graduated dental school (grandson in [place]); can't come see her grandkids because of her hip pain. Celebrates Passover.  Daugther came tin 06/2023 visit  Family History: The patient's family history includes Breast cancer in her sister; Cancer in her brother and father; Diabetes in her maternal aunt.  ROS:   Please see the history of present illness.   EKGs/Labs/Other Studies Reviewed:    The following studies were reviewed today:  Cardiac Studies & Procedures     STRESS TESTS  MYOCARDIAL PERFUSION IMAGING 04/14/2018  Narrative  Nuclear stress EF: 77%.  The left ventricular ejection fraction is hyperdynamic (>65%).  There was no ST segment deviation noted during stress.  The study is normal.  This is a low risk study.  Normal pharmacologic nuclear stress test with no evidence for prior infarct or ischemia. Hyperdynamic LVEF.   ECHOCARDIOGRAM  ECHOCARDIOGRAM COMPLETE 04/08/2022  Narrative ECHOCARDIOGRAM REPORT    Patient Name:   Christina Sellers Date of Exam: 04/08/2022 Medical Rec #:  [id number]         Height:       58.0 in Accession #:    [id number]        Weight:       136.0 lb Date of Birth:  1941/01/19         BSA:          1.546 m Patient Age:    80 years          BP:           185/70 mmHg Patient Gender: F                 HR:           76 bpm. Exam Location:   [address]  Procedure: 2D Echo, 3D Echo, Color Doppler, Cardiac Doppler and Strain Analysis  Indications:    R06.09 Dyspnea on exertion  History:        Patient has prior history of Echocardiogram examinations, most recent 02/02/2021. PVD and COPD, Signs/Symptoms:Orthostatic hypotension, Syncope and Shortness of Breath; Risk Factors:Hypertension, Diabetes, Sleep Apnea and Former Smoker.  Sonographer:    Jorje Guild [facility], RDCS Referring Phys: [id number] [facility] A Brenetta Penny  IMPRESSIONS   1. Left ventricular ejection fraction, by estimation, is 60 to 65%. The left ventricle has normal function. The left ventricle has no regional wall motion abnormalities. Left ventricular diastolic parameters  are consistent with Grade I diastolic dysfunction (impaired relaxation). The average left ventricular global longitudinal strain is -24.5 %. The global longitudinal strain is normal. 2. Right ventricular systolic function is normal. The right ventricular size is normal. There is normal pulmonary artery systolic pressure. The estimated right ventricular systolic pressure is 34.1 mmHg. 3. Left atrial size was moderately dilated. 4. The mitral valve is normal in structure. No evidence of mitral valve regurgitation. No evidence of mitral stenosis. 5. The aortic valve is tricuspid. Aortic valve regurgitation is not visualized. Aortic valve sclerosis/calcification is present, without any evidence of aortic stenosis. 6. The inferior vena cava is normal in size with greater than 50% respiratory variability, suggesting right atrial pressure of 3 mmHg.  Comparison(s): 02/02/21 EF 65-70. Mild-moderate TR.  FINDINGS Left Ventricle: Left ventricular ejection fraction, by estimation, is 60 to 65%. The left ventricle has normal function. The left ventricle has no regional wall motion abnormalities. The average left ventricular global longitudinal strain is -24.5 %. The global longitudinal strain is normal. 3D left  ventricular ejection fraction analysis performed but not reported based on interpreter judgement due to suboptimal tracking. The left ventricular internal cavity size was normal in size. There is no left ventricular hypertrophy. Left ventricular diastolic parameters are consistent with Grade I diastolic dysfunction (impaired relaxation). Normal left ventricular filling pressure.  Right Ventricle: The right ventricular size is normal. No increase in right ventricular wall thickness. Right ventricular systolic function is normal. There is normal pulmonary artery systolic pressure. The tricuspid regurgitant velocity is 2.79 m/s, and with an assumed right atrial pressure of 3 mmHg, the estimated right ventricular systolic pressure is 34.1 mmHg.  Left Atrium: Left atrial size was moderately dilated.  Right Atrium: Right atrial size was normal in size.  Pericardium: There is no evidence of pericardial effusion.  Mitral Valve: The mitral valve is normal in structure. Mild to moderate mitral annular calcification. No evidence of mitral valve regurgitation. No evidence of mitral valve stenosis.  Tricuspid Valve: The tricuspid valve is normal in structure. Tricuspid valve regurgitation is trivial. No evidence of tricuspid stenosis.  Aortic Valve: The aortic valve is tricuspid. Aortic valve regurgitation is not visualized. Aortic valve sclerosis/calcification is present, without any evidence of aortic stenosis.  Pulmonic Valve: The pulmonic valve was normal in structure. Pulmonic valve regurgitation is not visualized. No evidence of pulmonic stenosis.  Aorta: The aortic root is normal in size and structure.  Venous: The inferior vena cava is normal in size with greater than 50% respiratory variability, suggesting right atrial pressure of 3 mmHg.  IAS/Shunts: No atrial level shunt detected by color flow Doppler.   LEFT VENTRICLE PLAX 2D LVIDd:         4.40 cm   Diastology LVIDs:         2.90 cm    LV e' medial:    6.96 cm/s LV PW:         0.90 cm   LV E/e' medial:  9.7 LV IVS:        1.00 cm   LV e' lateral:   6.96 cm/s LVOT diam:     2.00 cm   LV E/e' lateral: 9.7 LV SV:         80 LV SV Index:   52        2D Longitudinal Strain LVOT Area:     3.14 cm  2D Strain GLS (A2C):   -23.4 % 2D Strain GLS (A3C):   -26.7 % 2D  Strain GLS (A4C):   -23.4 % 2D Strain GLS Avg:     -24.5 %  3D Volume EF: 3D EF:        58 % LV EDV:       100 ml LV ESV:       42 ml LV SV:        58 ml  RIGHT VENTRICLE             IVC RV Basal diam:  3.50 cm     IVC diam: 1.80 cm RV S prime:     13.40 cm/s TAPSE (M-mode): 2.7 cm RVSP:           34.1 mmHg  LEFT ATRIUM             Index        RIGHT ATRIUM           Index LA diam:        4.00 cm 2.59 cm/m   RA Pressure: 3.00 mmHg LA Vol (A2C):   70.2 ml 45.41 ml/m  RA Area:     14.10 cm LA Vol (A4C):   57.0 ml 36.87 ml/m  RA Volume:   38.60 ml  24.97 ml/m LA Biplane Vol: 63.8 ml 41.27 ml/m AORTIC VALVE LVOT Vmax:   116.00 cm/s LVOT Vmean:  79.100 cm/s LVOT VTI:    0.256 m  AORTA Ao Root diam: 2.70 cm Ao Asc diam:  2.50 cm  MITRAL VALVE               TRICUSPID VALVE TR Peak grad:   31.1 mmHg MV Decel Time: 197 msec    TR Vmax:        279.00 cm/s MV E velocity: 67.60 cm/s  Estimated RAP:  3.00 mmHg MV A velocity: 96.90 cm/s  RVSP:           34.1 mmHg MV E/A ratio:  0.70 SHUNTS Systemic VTI:  0.26 m Systemic Diam: 2.00 cm  Armanda Magic MD Electronically signed by Armanda Magic MD Signature Date/Time: 04/08/2022/3:49:05 PM    Final    MONITORS  LONG TERM MONITOR (3-14 DAYS) 05/13/2023  Narrative   Patient had a minimum heart rate of 47 bpm, maximum heart rate of 194 bpm, and average heart rate of 69 bpm.   Predominant underlying rhythm was sinus rhythm.   One 4 beat run on NSVT.  One 11 beat run of slow SVT with artifact.   Isolated PACs were rare (<1.0%).   Isolated PVCs were rare (<1.0%).   Triggered and diary events associated  with sinus rhythm.  No malignant arrhythmias.            Carotid Duplex: Date:02/14/21 Results: Summary:  Right Carotid: Velocities in the right ICA are consistent with a 40-59%                 stenosis.   Left Carotid: Velocities in the left ICA are consistent with a 1-39%  stenosis.   Vertebrals:  Right vertebral artery demonstrates antegrade flow. Left  vertebral               artery demonstrates bidirectional flow.  Subclavians: Normal flow hemodynamics were seen in bilateral subclavian               arteries.   LE Duplex Venous: Date: 02/15/21 Results: Summary:  BILATERAL:  - No evidence of deep vein thrombosis seen in the lower extremities,  bilaterally.  -No evidence of popliteal  cyst, bilaterally.  RIGHT:  - No cystic structure found in the popliteal fossa.     LEFT:  - No cystic structure found in the popliteal fossa.    Recent Labs: 04/25/2023: Magnesium 2.5 05/01/2023: ALT 18; Hemoglobin 12.3; Platelets 150 06/30/2023: NT-Pro BNP 1,397 07/15/2023: BNP 200.9; BUN 23; Creatinine, Ser 1.29; Potassium 4.0; Sodium 137  Recent Lipid Panel    Component Value Date/Time   CHOL 149 08/26/2011 0400   TRIG 195 (H) 08/26/2011 0400   HDL 37 (L) 08/26/2011 0400   CHOLHDL 4.0 08/26/2011 0400   VLDL 39 08/26/2011 0400   LDLCALC 73 08/26/2011 0400   LDLDIRECT 65 07/15/2023 1321    Physical Exam:    VS:  BP (!) 122/50   Pulse (!) 54   Ht 4\' 10"  (1.473 m)   Wt 113 lb (51.3 kg)   SpO2 95%   BMI 23.62 kg/m     Wt Readings from Last 3 Encounters:  10/14/23 113 lb (51.3 kg)  09/22/23 111 lb 8 oz (50.6 kg)  07/15/23 116 lb (52.6 kg)    Gen: no distress, elderly female Neck: No JVD Cardiac: No Rubs or Gallops, systolic murmur, RRR +2 radial pulses Respiratory: Clear to auscultation bilaterally, normal effort, normal  respiratory rate GI: Soft, nontender, non-distended  MS: trace pitting  edema;  moves all extremities walks with walker Integument: Skin feels  warm Neuro:  At time of evaluation, alert and oriented to person/place/time   ASSESSMENT:    1. Systolic murmur   2. Hypertension associated with diabetes (HCC)   3. Aortic atherosclerosis (HCC)   4. Chronic heart failure with preserved ejection fraction (HCC)   5. Paroxysmal SVT (supraventricular tachycardia) (HCC)   6. Bilateral carotid artery stenosis   7. Chronic bronchitis, unspecified chronic bronchitis type (HCC)      PLAN:    Heart Failure with Preserved Ejection Fraction (HFpEF- Chronic) - Chronic condition with near euvolemic status on current therapy. Asymptomatic with minimal lower extremity swelling and NYHA Class I. Trace pitting edema noted. Improved medication adherence with family support. Advised not to decrease Lasix due to lower extremity swelling and systolic murmur. - Continue spironolactone 25 mg PO daily - Continue Jardiance 10 mg PO daily - Continue Lasix 20 mg PO daily - Order echocardiogram in February for follow-up of tricuspid regurgitation  Hypertension Controlled on current regimen. Intolerances include near syncope with high-dose calcium channel blockers, orthostasis with hydrochlorothiazide, and leg swelling with hydralazine. Currently managed with amlodipine 5 mg, clonidine 0.1 mg BID, MRA, and diuretic. Finerenone not initiated due to concomitant hypertension. - Continue amlodipine 5 mg PO daily - Continue clonidine 0.1 mg PO BID - Continue current MRA and diuretic regimen - has PREP exercise referral placed with Gillian Shields NP  Aortic Atherosclerosis with History of TIA and Hyperlipidemia LDL goal of <70 met on rosuvastatin. Asymptomatic for premature atrial contractions and supraventricular tachycardia. Mild carotid artery stenosis managed with aspirin.  - Continue rosuvastatin - Continue aspirin  PACs and SVT -Beta blockers not started but may consider Coreg 6.25 mg if symptoms or blood pressure worsen.  Chronic Kidney Disease (CKD)  Stage 3A - Well-managed with the above  Obstructive Lung Disease - stable, as per PCP  Early Signs of cognitive deficit - Significant memory loss with potential early dementia. Family concerned about long-term implications. Consider stopping clonidine if cognitive deficit medications are needed due to bradycardia risk. - Monitor cognitive function - Discuss potential medication adjustments if cognitive deficits progress  Anxiety - Managed with Lexapro initiated by Hubbard Hartshorn NP. Positive response to medication. - Continue Lexapro as prescribed by Hubbard Hartshorn NP  General Health Maintenance Encouraged to pursue PREP exercise program for overall health improvement. - Encourage participation in PREP exercise program  Follow-up - Follow up with Gillian Shields NP in early or late December - Follow up with me in the spring  Medication Adjustments/Labs and Tests Ordered: Current medicines are reviewed at length with the patient today.  Concerns regarding medicines are outlined above.  Orders Placed This Encounter  Procedures   ECHOCARDIOGRAM COMPLETE    No orders of the defined types were placed in this encounter.    Patient Instructions  Medication Instructions:  Your physician recommends that you continue on your current medications as directed. Please refer to the Current Medication list given to you today.  *If you need a refill on your cardiac medications before your next appointment, please call your pharmacy*   Lab Work: NONE If you have labs (blood work) drawn today and your tests are completely normal, you will receive your results only by: MyChart Message (if you have MyChart) OR A paper copy in the mail If you have any lab test that is abnormal or we need to change your treatment, we will call you to review the results.   Testing/Procedures: FEB 2025- - Your physician has requested that you have an echocardiogram. Echocardiography is a painless test that  uses sound waves to create images of your heart. It provides your doctor with information about the size and shape of your heart and how well your heart's chambers and valves are working. This procedure takes approximately one hour. There are no restrictions for this procedure. Please do NOT wear cologne, perfume, aftershave, or lotions (deodorant is allowed). Please arrive 15 minutes prior to your appointment time.  Please note: We ask at that you not bring children with you during ultrasound (echo/ vascular) testing. Due to room size and safety concerns, children are not allowed in the ultrasound rooms during exams. Our front office staff cannot provide observation of children in our lobby area while testing is being conducted. An adult accompanying a patient to their appointment will only be allowed in the ultrasound room at the discretion of the ultrasound technician under special circumstances. We apologize for any inconvenience.    Follow-Up: At [facility], you and your health needs are our priority.  As part of our continuing mission to provide you with exceptional heart care, we have created designated Provider Care Teams.  These Care Teams include your primary Cardiologist (physician) and Advanced Practice Providers (APPs -  Physician Assistants and Nurse Practitioners) who all work together to provide you with the care you need, when you need it.  We recommend signing up for the patient portal called "MyChart".  Sign up information is provided on this After Visit Summary.  MyChart is used to connect with patients for Virtual Visits (Telemedicine).  Patients are able to view lab/test results, encounter notes, upcoming appointments, etc.  Non-urgent messages can be sent to your provider as well.   To learn more about what you can do with MyChart, go to [email or web address].    Your next appointment:   Late December or Early January   Provider:   Gillian Shields, NP        Signed, [name], MD  10/14/2023 1:10 PM    [facility]

## 2023-10-14 NOTE — Patient Instructions (Signed)
Medication Instructions:  Your physician recommends that you continue on your current medications as directed. Please refer to the Current Medication list given to you today.  *If you need a refill on your cardiac medications before your next appointment, please call your pharmacy*   Lab Work: NONE If you have labs (blood work) drawn today and your tests are completely normal, you will receive your results only by: MyChart Message (if you have MyChart) OR A paper copy in the mail If you have any lab test that is abnormal or we need to change your treatment, we will call you to review the results.   Testing/Procedures: FEB 2025- - Your physician has requested that you have an echocardiogram. Echocardiography is a painless test that uses sound waves to create images of your heart. It provides your doctor with information about the size and shape of your heart and how well your heart's chambers and valves are working. This procedure takes approximately one hour. There are no restrictions for this procedure. Please do NOT wear cologne, perfume, aftershave, or lotions (deodorant is allowed). Please arrive 15 minutes prior to your appointment time.  Please note: We ask at that you not bring children with you during ultrasound (echo/ vascular) testing. Due to room size and safety concerns, children are not allowed in the ultrasound rooms during exams. Our front office staff cannot provide observation of children in our lobby area while testing is being conducted. An adult accompanying a patient to their appointment will only be allowed in the ultrasound room at the discretion of the ultrasound technician under special circumstances. We apologize for any inconvenience.    Follow-Up: At [facility], you and your health needs are our priority.  As part of our continuing mission to provide you with exceptional heart care, we have created designated Provider Care Teams.  These Care Teams  include your primary Cardiologist (physician) and Advanced Practice Providers (APPs -  Physician Assistants and Nurse Practitioners) who all work together to provide you with the care you need, when you need it.  We recommend signing up for the patient portal called "MyChart".  Sign up information is provided on this After Visit Summary.  MyChart is used to connect with patients for Virtual Visits (Telemedicine).  Patients are able to view lab/test results, encounter notes, upcoming appointments, etc.  Non-urgent messages can be sent to your provider as well.   To learn more about what you can do with MyChart, go to [email or web address].    Your next appointment:   Late December or Early January   Provider:   Gillian Shields, NP

## 2023-10-17 ENCOUNTER — Telehealth: Payer: Self-pay

## 2023-10-17 NOTE — Telephone Encounter (Signed)
**Note De-Identified Latoria Dry Obfuscation** Procedure code: [id number] Description Home sleep test (HST) with type III portable monitor, unattended; minimum of 4 channels: 2 respiratory movement/airflow, 1 ECG/heart rate and 1 oxygen saturation Inquiry summary Notification/Prior Authorization not required for this service.

## 2023-10-20 ENCOUNTER — Ambulatory Visit (HOSPITAL_BASED_OUTPATIENT_CLINIC_OR_DEPARTMENT_OTHER): Payer: Medicare Other | Admitting: Physical Therapy

## 2023-10-25 ENCOUNTER — Ambulatory Visit (HOSPITAL_BASED_OUTPATIENT_CLINIC_OR_DEPARTMENT_OTHER): Payer: Medicare Other | Attending: Family | Admitting: Cardiology

## 2023-10-25 DIAGNOSIS — L821 Other seborrheic keratosis: Secondary | ICD-10-CM | POA: Diagnosis not present

## 2023-10-25 DIAGNOSIS — L570 Actinic keratosis: Secondary | ICD-10-CM | POA: Diagnosis not present

## 2023-10-25 DIAGNOSIS — D692 Other nonthrombocytopenic purpura: Secondary | ICD-10-CM | POA: Diagnosis not present

## 2023-10-25 DIAGNOSIS — L853 Xerosis cutis: Secondary | ICD-10-CM | POA: Diagnosis not present

## 2023-10-27 ENCOUNTER — Ambulatory Visit (HOSPITAL_BASED_OUTPATIENT_CLINIC_OR_DEPARTMENT_OTHER): Payer: Medicare Other | Attending: Internal Medicine | Admitting: Physical Therapy

## 2023-10-27 ENCOUNTER — Encounter (HOSPITAL_BASED_OUTPATIENT_CLINIC_OR_DEPARTMENT_OTHER): Payer: Self-pay | Admitting: Physical Therapy

## 2023-10-27 ENCOUNTER — Other Ambulatory Visit: Payer: Self-pay

## 2023-10-27 DIAGNOSIS — R29898 Other symptoms and signs involving the musculoskeletal system: Secondary | ICD-10-CM | POA: Insufficient documentation

## 2023-10-27 DIAGNOSIS — R2689 Other abnormalities of gait and mobility: Secondary | ICD-10-CM | POA: Insufficient documentation

## 2023-10-27 DIAGNOSIS — M6281 Muscle weakness (generalized): Secondary | ICD-10-CM | POA: Diagnosis not present

## 2023-10-27 NOTE — Therapy (Signed)
OUTPATIENT PHYSICAL THERAPY LOWER EXTREMITY EVALUATION   Patient Name: Christina Sellers MRN: [id number] DOB:Feb 11, 1941, 82 y.o., female Today's Date: 10/28/2023  END OF SESSION:  PT End of Session - 10/27/23 1400     Visit Number 1    Number of Visits 16    Date for PT Re-Evaluation 12/23/23    PT Start Time 1345    PT Stop Time 1428    PT Time Calculation (min) 43 min    Activity Tolerance Patient tolerated treatment well    Behavior During Therapy WFL for tasks assessed/performed             Past Medical History:  Diagnosis Date   Abnormal Pap smear of vagina    Allergic sinusitis    Anxiety    Atherosclerotic peripheral vascular disease (HCC)    Carotid artery stenosis    Cellulitis    COPD with asthma (HCC)    not an issue now   Depression    Diabetes mellitus type 2, uncomplicated (HCC)    DJD (degenerative joint disease)    Elevated cholesterol    Elevated MCV    secondary to alchol   GERD (gastroesophageal reflux disease)    not an issue now.   Gout    Hearing difficulty    bilateral hearing aids   History of recurrent UTIs    Hypertension    loss weight'off meds now"   Hypothyroidism    IBS (irritable bowel syndrome)    Insomnia    Labial fusion    Osteoarthritis of left knee    Osteopenia    Recurrent vaginitis    Sjogren's syndrome (HCC)    Syncope 11.7.14   secondary to dehydration and possible hypoglycemia   Tendonitis    Thyroid disease    Transfusion history    age 89 "anemia"   Past Surgical History:  Procedure Laterality Date   CATARACT EXTRACTION, BILATERAL Bilateral    CHOLECYSTECTOMY     open   COLONOSCOPY WITH PROPOFOL N/A 03/23/2016   Procedure: COLONOSCOPY WITH PROPOFOL;  Surgeon: Charolett Bumpers, MD;  Location: [facility];  Service: Endoscopy;  Laterality: N/A;   EYE SURGERY     FOOT SURGERY Left    retained hardware   KNEE SURGERY Left    scope    TONSILLECTOMY     TOTAL HIP ARTHROPLASTY Right 03/30/2021    Procedure: RIGHTTOTAL HIP ARTHROPLASTY ANTERIOR APPROACH;  Surgeon: Gean Birchwood, MD;  Location: [facility];  Service: Orthopedics;  Laterality: Right;   Patient Active Problem List   Diagnosis Date Noted   Paroxysmal SVT (supraventricular tachycardia) (HCC) 06/30/2023   Bilateral carotid artery stenosis 01/21/2023   Abnormal finding of blood chemistry, unspecified 05/13/2021   Acquired hallux valgus 05/13/2021   Age-related osteoporosis without current pathological fracture 05/13/2021   Allergic rhinitis 05/13/2021   Anxiety disorder 05/13/2021   Apnea 05/13/2021   Avascular necrosis of bone of hip (HCC) 05/13/2021   Benign paroxysmal positional vertigo 05/13/2021   Bilateral tinnitus 05/13/2021   Bronchospasm 05/13/2021   Chronic kidney disease, stage 3 unspecified (HCC) 05/13/2021   Chronic obstructive pulmonary disease, unspecified (HCC) 05/13/2021   Constipation 05/13/2021   Costochondritis 05/13/2021   Disequilibrium 05/13/2021   Dysphagia 05/13/2021   DOE (dyspnea on exertion) 05/13/2021   Elevated blood-pressure reading, without diagnosis of hypertension 05/13/2021   Fatigue 05/13/2021   Gastro-esophageal reflux disease without esophagitis 05/13/2021   Generalized hyperhidrosis 05/13/2021   Gout 05/13/2021   Hair loss disorder 05/13/2021  Hematochezia 05/13/2021   Hemorrhage of colon due to diverticulosis 05/13/2021   Hypersomnia 05/13/2021   Indigestion 05/13/2021   Labyrinthitis of both ears 05/13/2021   Left flank pain 05/13/2021   Leukocytosis 05/13/2021   Lichen planus 05/13/2021   Localized swelling, mass and lump, neck 05/13/2021   Major depression, single episode 05/13/2021   Memory impairment 05/13/2021   Myalgia 05/13/2021   Night sweats 05/13/2021   Noninflammatory disorder of vagina 05/13/2021   Obstructive sleep apnea syndrome 05/13/2021   Osteoarthritis of knee 05/13/2021   Other long term (current) drug therapy 05/13/2021   Pharyngeal dysphagia  05/13/2021   Sciatica 05/13/2021   Sjogren's syndrome (HCC) 05/13/2021   Sleep disorder 05/13/2021   Status post hip replacement 05/13/2021   Superficial phlebitis 05/13/2021   Tear film insufficiency 05/13/2021   Urinary tract infectious disease 05/13/2021   Urticaria 05/13/2021   Ventral hernia without obstruction or gangrene 05/13/2021   Vitamin D deficiency 05/13/2021   H/O total hip arthroplasty, right 03/30/2021   Osteoarthritis of right hip 03/25/2021   Orthostatic hypotension 02/27/2021   IBS (irritable bowel syndrome) 02/11/2021   Hyponatremia 02/11/2021   Acute cystitis without hematuria    Chronic back pain    E-coli UTI 02/01/2021   Dehydration with hyponatremia 02/01/2021   Chronic heart failure with preserved ejection fraction (HCC) 02/01/2021   Hypertension associated with diabetes (HCC) 01/21/2021   Hyperlipidemia associated with type 2 diabetes mellitus (HCC) 01/21/2021   Aortic atherosclerosis (HCC) 01/21/2021   Chronic low back pain 10/05/2019   Type 2 diabetes mellitus with hyperglycemia, without long-term current use of insulin (HCC) 02/21/2019   Hypothyroidism 02/21/2019   Pain of left hip joint 12/08/2018   Dermatochalasis of both eyelids 07/04/2018   Viral labyrinthitis 01/13/2017   Acquired spondylolisthesis 04/30/2014   Degeneration of lumbar intervertebral disc 04/30/2014   Osteopenia 11/22/2013   Labial fusion     PCP: Hillard Danker MD  REFERRING PROVIDER: Hillard Danker MD   REFERRING DIAG:  Diagnosis  R29.898 (ICD-10-CM) - Other symptoms and signs involving the musculoskeletal system    THERAPY DIAG:  Other abnormalities of gait and mobility  Muscle weakness (generalized)  Rationale for Evaluation and Treatment: Rehabilitation  ONSET DATE: long standing muscle weakness   SUBJECTIVE:   SUBJECTIVE STATEMENT: The patient has a long history of muscle weakness in her lower extremities. She reports she just uses's the walker when she is out.  She reports she is not really sure why she is at physical therapy, but she feels like maybe she could be more steady.   PERTINENT HISTORY: Anxiety, PVD, HTN, DJD, depression, left foot surgery, left knee OA, hard of hearing, left knee scope; right THA, Osteopenia PAIN:  Patient not having pain at this time  PRECAUTIONS: None  RED FLAGS: None   WEIGHT BEARING RESTRICTIONS: No  FALLS:  Has patient fallen in last 6 months? No  LIVING ENVIRONMENT: Steps inside her house. Fine going up. Has to go slow going down  OCCUPATION:  Retired.   Hobbies: none   PLOF: Independent with household mobility with device  PATIENT GOALS: " to get a little more steady"  NEXT MD VISIT:  Nothing scheduled   OBJECTIVE:  Note: Objective measures were completed at Evaluation unless otherwise noted.  DIAGNOSTIC FINDINGS:    PATIENT SURVEYS:  FOTO 51 % 59 expected at 10 visits   COGNITION: Overall cognitive status: Within functional limits for tasks assessed     SENSATION: WFL   POSTURE:  No Significant postural limitations  PALPATION: No TTP    LOWER EXTREMITY MMT:  MMT Right eval Left eval  Hip flexion 12.4 13.8  Hip extension    Hip abduction 8.4 8.6  Hip adduction    Hip internal rotation    Hip external rotation    Knee flexion    Knee extension 23.1 16.1  Ankle dorsiflexion    Ankle plantarflexion    Ankle inversion    Ankle eversion     (Blank rows = not tested)   FUNCTIONAL TESTS:  5x sit to stand 18 sec  GAIT: Mild forward flexion on the walker; decreased hip flexion    Balance:   Narrow base SBA  NArrow base eyes closed min a   Tandem min a bialteral   TODAY'S TREATMENT:                                                                                                                              DATE:   Access Code: [email or web address] URL: [email or web address] Date: 10/28/2023 Prepared by: Lorayne Bender  Exercises - Seated Knee Extension with  Resistance  - 1 x daily - 7 x weekly - 3 sets - 10 reps - Seated March with Resistance  - 1 x daily - 7 x weekly - 3 sets - 10 reps - Seated Hip Abduction with Resistance  - 1 x daily - 7 x weekly - 3 sets - 10 reps  PATIENT EDUCATION:  Education details: HEP, symptom management, benefits of exercises, reviewed tests and measures  Person educated: Patient Education method: Explanation, Demonstration, Tactile cues, Verbal cues, and Handouts Education comprehension: verbalized understanding, returned demonstration, verbal cues required, tactile cues required, and needs further education  HOME EXERCISE PROGRAM: Access Code: [email or web address] URL: [email or web address] Date: 10/28/2023 Prepared by: Lorayne Bender  Exercises - Seated Knee Extension with Resistance  - 1 x daily - 7 x weekly - 3 sets - 10 reps - Seated March with Resistance  - 1 x daily - 7 x weekly - 3 sets - 10 reps - Seated Hip Abduction with Resistance  - 1 x daily - 7 x weekly - 3 sets - 10 reps  ASSESSMENT:  CLINICAL IMPRESSION: Patient is an 82 year old female who presents with decreased stability with gait. She presents with decreased strength in bilateral LE, a mild antalgic gait, decreased balance, and a decreased 5x sit to stand test time. She would benefit from skilled therapy to improve her ability to perfrom daily tasks.  OBJECTIVE IMPAIRMENTS: Abnormal gait, decreased activity tolerance, decreased balance, decreased knowledge of use of DME, difficulty walking, and decreased strength.   ACTIVITY LIMITATIONS: carrying, lifting, standing, squatting, transfers, and locomotion level  PARTICIPATION LIMITATIONS: meal prep, cleaning, laundry, driving, shopping, and yard work  PERSONAL FACTORS: Age, Past/current experiences, and 1-2 comorbidities: right hip replacement, multiple   are also affecting patient's functional outcome.   REHAB POTENTIAL:  good   CLINICAL DECISION MAKING: Evolving/moderate  complexity  declining general mobility   EVALUATION COMPLEXITY: Moderate   GOALS: Goals reviewed with patient? Yes  SHORT TERM GOALS: Target date: 11/25/2023   Patient wil increase gross bilateral LE strength by 5 lbs  Baseline: Goal status: INITIAL  2.  Patient will reduce 5x sit to stand time by 6 seconds Baseline:  Goal status: INITIAL  3.  Patient will be independent and complaint with base HEP  Baseline:  Goal status: INITIAL   LONG TERM GOALS: Target date: 12/23/2023    Patient will rpeort decreased fatigue mbaulting in the community  Baseline:  Goal status: INITIAL  2.  Patient will go down steps with self reported improved stability   Baseline:  Goal status: INITIAL  3.  Patient will be comfortable with gym program if she chooses to use her gym membership  Baseline:  Goal status: INITIAL   PLAN:  PT FREQUENCY: 1-2x/week  PT DURATION: 8 weeks  PLAN FOR NEXT SESSION: consider stair training; review seated series she was given. Consider supine series or standing series of exercises. Consider progression out into the gym.      Dessie Coma, PT 10/28/2023, 8:23 AM

## 2023-10-28 ENCOUNTER — Encounter (HOSPITAL_BASED_OUTPATIENT_CLINIC_OR_DEPARTMENT_OTHER): Payer: Self-pay | Admitting: Physical Therapy

## 2023-11-02 ENCOUNTER — Ambulatory Visit: Payer: Medicare Other | Admitting: Diagnostic Neuroimaging

## 2023-11-16 ENCOUNTER — Telehealth: Payer: Self-pay | Admitting: Neurology

## 2023-11-16 ENCOUNTER — Encounter (HOSPITAL_BASED_OUTPATIENT_CLINIC_OR_DEPARTMENT_OTHER): Payer: Self-pay

## 2023-11-16 ENCOUNTER — Encounter: Payer: Self-pay | Admitting: Neurology

## 2023-11-16 ENCOUNTER — Ambulatory Visit (HOSPITAL_BASED_OUTPATIENT_CLINIC_OR_DEPARTMENT_OTHER): Payer: Medicare Other | Attending: Internal Medicine

## 2023-11-16 DIAGNOSIS — R2689 Other abnormalities of gait and mobility: Secondary | ICD-10-CM | POA: Insufficient documentation

## 2023-11-16 DIAGNOSIS — M6281 Muscle weakness (generalized): Secondary | ICD-10-CM | POA: Insufficient documentation

## 2023-11-16 NOTE — Therapy (Signed)
OUTPATIENT PHYSICAL THERAPY LOWER EXTREMITY TREATMENT   Patient Name: Christina Sellers MRN: [id number] DOB:[birth year], 82 y.o., female Today's Date: 11/16/2023  END OF SESSION:    Past Medical History:  Diagnosis Date   Abnormal Pap smear of vagina    Allergic sinusitis    Anxiety    Atherosclerotic peripheral vascular disease (HCC)    Carotid artery stenosis    Cellulitis    COPD with asthma (HCC)    not an issue now   Depression    Diabetes mellitus type 2, uncomplicated (HCC)    DJD (degenerative joint disease)    Elevated cholesterol    Elevated MCV    secondary to alchol   GERD (gastroesophageal reflux disease)    not an issue now.   Gout    Hearing difficulty    bilateral hearing aids   History of recurrent UTIs    Hypertension    loss weight'off meds now"   Hypothyroidism    IBS (irritable bowel syndrome)    Insomnia    Labial fusion    Osteoarthritis of left knee    Osteopenia    Recurrent vaginitis    Sjogren's syndrome (HCC)    Syncope 11.7.14   secondary to dehydration and possible hypoglycemia   Tendonitis    Thyroid disease    Transfusion history    age 60 "anemia"   Past Surgical History:  Procedure Laterality Date   CATARACT EXTRACTION, BILATERAL Bilateral    CHOLECYSTECTOMY     open   COLONOSCOPY WITH PROPOFOL N/A 03/23/2016   Procedure: COLONOSCOPY WITH PROPOFOL;  Surgeon: Charolett Bumpers, MD;  Location: [facility];  Service: Endoscopy;  Laterality: N/A;   EYE SURGERY     FOOT SURGERY Left    retained hardware   KNEE SURGERY Left    scope    TONSILLECTOMY     TOTAL HIP ARTHROPLASTY Right 03/30/2021   Procedure: RIGHTTOTAL HIP ARTHROPLASTY ANTERIOR APPROACH;  Surgeon: Gean Birchwood, MD;  Location: [facility];  Service: Orthopedics;  Laterality: Right;   Patient Active Problem List   Diagnosis Date Noted   Paroxysmal SVT (supraventricular tachycardia) (HCC) 06/30/2023   Bilateral carotid artery stenosis 01/21/2023   Abnormal finding of  blood chemistry, unspecified 05/13/2021   Acquired hallux valgus 05/13/2021   Age-related osteoporosis without current pathological fracture 05/13/2021   Allergic rhinitis 05/13/2021   Anxiety disorder 05/13/2021   Apnea 05/13/2021   Avascular necrosis of bone of hip (HCC) 05/13/2021   Benign paroxysmal positional vertigo 05/13/2021   Bilateral tinnitus 05/13/2021   Bronchospasm 05/13/2021   Chronic kidney disease, stage 3 unspecified (HCC) 05/13/2021   Chronic obstructive pulmonary disease, unspecified (HCC) 05/13/2021   Constipation 05/13/2021   Costochondritis 05/13/2021   Disequilibrium 05/13/2021   Dysphagia 05/13/2021   DOE (dyspnea on exertion) 05/13/2021   Elevated blood-pressure reading, without diagnosis of hypertension 05/13/2021   Fatigue 05/13/2021   Gastro-esophageal reflux disease without esophagitis 05/13/2021   Generalized hyperhidrosis 05/13/2021   Gout 05/13/2021   Hair loss disorder 05/13/2021   Hematochezia 05/13/2021   Hemorrhage of colon due to diverticulosis 05/13/2021   Hypersomnia 05/13/2021   Indigestion 05/13/2021   Labyrinthitis of both ears 05/13/2021   Left flank pain 05/13/2021   Leukocytosis 05/13/2021   Lichen planus 05/13/2021   Localized swelling, mass and lump, neck 05/13/2021   Major depression, single episode 05/13/2021   Memory impairment 05/13/2021   Myalgia 05/13/2021   Night sweats 05/13/2021   Noninflammatory disorder of vagina 05/13/2021  Obstructive sleep apnea syndrome 05/13/2021   Osteoarthritis of knee 05/13/2021   Other long term (current) drug therapy 05/13/2021   Pharyngeal dysphagia 05/13/2021   Sciatica 05/13/2021   Sjogren's syndrome (HCC) 05/13/2021   Sleep disorder 05/13/2021   Status post hip replacement 05/13/2021   Superficial phlebitis 05/13/2021   Tear film insufficiency 05/13/2021   Urinary tract infectious disease 05/13/2021   Urticaria 05/13/2021   Ventral hernia without obstruction or gangrene  05/13/2021   Vitamin D deficiency 05/13/2021   H/O total hip arthroplasty, right 03/30/2021   Osteoarthritis of right hip 03/25/2021   Orthostatic hypotension 02/27/2021   IBS (irritable bowel syndrome) 02/11/2021   Hyponatremia 02/11/2021   Acute cystitis without hematuria    Chronic back pain    E-coli UTI 02/01/2021   Dehydration with hyponatremia 02/01/2021   Chronic heart failure with preserved ejection fraction (HCC) 02/01/2021   Hypertension associated with diabetes (HCC) 01/21/2021   Hyperlipidemia associated with type 2 diabetes mellitus (HCC) 01/21/2021   Aortic atherosclerosis (HCC) 01/21/2021   Chronic low back pain 10/05/2019   Type 2 diabetes mellitus with hyperglycemia, without long-term current use of insulin (HCC) 02/21/2019   Hypothyroidism 02/21/2019   Pain of left hip joint 12/08/2018   Dermatochalasis of both eyelids 07/04/2018   Viral labyrinthitis 01/13/2017   Acquired spondylolisthesis 04/30/2014   Degeneration of lumbar intervertebral disc 04/30/2014   Osteopenia 11/22/2013   Labial fusion     PCP: Hillard Danker MD  REFERRING PROVIDER: Hillard Danker MD   REFERRING DIAG:  Diagnosis  R29.898 (ICD-10-CM) - Other symptoms and signs involving the musculoskeletal system    THERAPY DIAG:  No diagnosis found.  Rationale for Evaluation and Treatment: Rehabilitation  ONSET DATE: long standing muscle weakness   SUBJECTIVE:   SUBJECTIVE STATEMENT: Pt denies falls recently. She reports she has been doing well. "I don't do a lot at home."  PERTINENT HISTORY: Anxiety, PVD, HTN, DJD, depression, left foot surgery, left knee OA, hard of hearing, left knee scope; right THA, Osteopenia PAIN:  Patient not having pain at this time  PRECAUTIONS: None  RED FLAGS: None   WEIGHT BEARING RESTRICTIONS: No  FALLS:  Has patient fallen in last 6 months? No  LIVING ENVIRONMENT: Steps inside her house. Fine going up. Has to go slow going down  OCCUPATION:  Retired.    Hobbies: none   PLOF: Independent with household mobility with device  PATIENT GOALS: " to get a little more steady"  NEXT MD VISIT:  Nothing scheduled   OBJECTIVE:  Note: Objective measures were completed at Evaluation unless otherwise noted.  DIAGNOSTIC FINDINGS:    PATIENT SURVEYS:  FOTO 51 % 59 expected at 10 visits   COGNITION: Overall cognitive status: Within functional limits for tasks assessed     SENSATION: WFL   POSTURE: No Significant postural limitations  PALPATION: No TTP    LOWER EXTREMITY MMT:  MMT Right eval Left eval  Hip flexion 12.4 13.8  Hip extension    Hip abduction 8.4 8.6  Hip adduction    Hip internal rotation    Hip external rotation    Knee flexion    Knee extension 23.1 16.1  Ankle dorsiflexion    Ankle plantarflexion    Ankle inversion    Ankle eversion     (Blank rows = not tested)   FUNCTIONAL TESTS:  5x sit to stand 18 sec  GAIT: Mild forward flexion on the walker; decreased hip flexion    Balance:  Narrow base SBA  NArrow base eyes closed min a   Tandem min a bialteral   TODAY'S TREATMENT:                                                                                                                              DATE:    12/11: Seated clam GTB (4" step under feet) x30 Seated marching 2# 2x10 (4" step under feet) Standing hip 3 way 2x10ea Standing HR/TR x20 Hurdle step overs -one at a time 4 hurdles with CGA (gait belt) x4  - then step over step without rail- 2hurdles CGA on gait belt Romberg stance on airex 2x 30sec Standing marching on airex 2x10 SBA Standing march no UE support x10 CGA Sit to stands 2x10 from chair Gait training no AD, cues for step length and heel contact    Eval: Access Code: [email or web address] URL: [email or web address] Date: 10/28/2023 Prepared by: Lorayne Bender  Exercises - Seated Knee Extension with Resistance  - 1 x daily - 7 x weekly - 3 sets - 10 reps - Seated  March with Resistance  - 1 x daily - 7 x weekly - 3 sets - 10 reps - Seated Hip Abduction with Resistance  - 1 x daily - 7 x weekly - 3 sets - 10 reps  PATIENT EDUCATION:  Education details: HEP, symptom management, benefits of exercises, reviewed tests and measures  Person educated: Patient Education method: Explanation, Demonstration, Tactile cues, Verbal cues, and Handouts Education comprehension: verbalized understanding, returned demonstration, verbal cues required, tactile cues required, and needs further education  HOME EXERCISE PROGRAM: Access Code: [email or web address] URL: [email or web address] Date: 10/28/2023 Prepared by: Lorayne Bender  Exercises - Seated Knee Extension with Resistance  - 1 x daily - 7 x weekly - 3 sets - 10 reps - Seated March with Resistance  - 1 x daily - 7 x weekly - 3 sets - 10 reps - Seated Hip Abduction with Resistance  - 1 x daily - 7 x weekly - 3 sets - 10 reps  ASSESSMENT:  CLINICAL IMPRESSION: Worked on balance challenges for both dynamic and static stability. Challenged by hurdle step overs. Requires CGA with gait belt when not using UE support on railing. Worked on gait training with pt with cues required for increased step length and heel contact. Pt demonstrates a shuffling pattern. Pt will continue to benefit from PT for gait quality and improved balance/NMC.     OBJECTIVE IMPAIRMENTS: Abnormal gait, decreased activity tolerance, decreased balance, decreased knowledge of use of DME, difficulty walking, and decreased strength.   ACTIVITY LIMITATIONS: carrying, lifting, standing, squatting, transfers, and locomotion level  PARTICIPATION LIMITATIONS: meal prep, cleaning, laundry, driving, shopping, and yard work  PERSONAL FACTORS: Age, Past/current experiences, and 1-2 comorbidities: right hip replacement, multiple   are also affecting patient's functional outcome.   REHAB POTENTIAL:  good   CLINICAL DECISION MAKING: Evolving/moderate  complexity declining general mobility   EVALUATION COMPLEXITY: Moderate  GOALS: Goals reviewed with patient? Yes  SHORT TERM GOALS: Target date: 11/25/2023   Patient wil increase gross bilateral LE strength by 5 lbs  Baseline: Goal status: INITIAL  2.  Patient will reduce 5x sit to stand time by 6 seconds Baseline:  Goal status: INITIAL  3.  Patient will be independent and complaint with base HEP  Baseline:  Goal status: INITIAL   LONG TERM GOALS: Target date: 12/23/2023    Patient will rpeort decreased fatigue mbaulting in the community  Baseline:  Goal status: INITIAL  2.  Patient will go down steps with self reported improved stability   Baseline:  Goal status: INITIAL  3.  Patient will be comfortable with gym program if she chooses to use her gym membership  Baseline:  Goal status: INITIAL   PLAN:  PT FREQUENCY: 1-2x/week  PT DURATION: 8 weeks  PLAN FOR NEXT SESSION: consider stair training; review seated series she was given. Consider supine series or standing series of exercises. Consider progression out into the gym.      Donnel Saxon Hashir Deleeuw, PTA 11/16/2023, 1:03 PM

## 2023-11-16 NOTE — Telephone Encounter (Signed)
LVM and sent letter in mail informing pt of need to reschedule 11/28/23 appt - MD out

## 2023-11-22 NOTE — Telephone Encounter (Signed)
This is the second time we have cancelled pt's appt due to MD being out of office, if pt calls back to r/s please work in as soon as possible, can route to referrals if needed. Do not push her out to next available, thank you

## 2023-11-23 ENCOUNTER — Telehealth: Payer: Self-pay | Admitting: Neurology

## 2023-11-23 NOTE — Telephone Encounter (Signed)
Pt's daughter called to r/s canceled appt due to provider. Next available is 02/2024. She is requesting a sooner appt if possible due to [place] and she would be the one bringing pt. Requesting call back

## 2023-11-24 ENCOUNTER — Ambulatory Visit (HOSPITAL_BASED_OUTPATIENT_CLINIC_OR_DEPARTMENT_OTHER): Payer: Medicare Other | Admitting: Physical Therapy

## 2023-11-24 DIAGNOSIS — R2689 Other abnormalities of gait and mobility: Secondary | ICD-10-CM | POA: Diagnosis not present

## 2023-11-24 DIAGNOSIS — M6281 Muscle weakness (generalized): Secondary | ICD-10-CM | POA: Diagnosis not present

## 2023-11-24 NOTE — Telephone Encounter (Signed)
Returned call to pt daughter who stated that she needed appt 11/27/23-12/04/23 and needs an appt during that time frame as [place]. Would not object to any other provider besides Dr. Terrace Arabia. She was prevuously scheduled w/[facility] as well and it was cancelled by provider. Pt has been cancelled by provider twice.

## 2023-11-24 NOTE — Telephone Encounter (Signed)
Waiting on opening to come available, no appointments open during that time frame at this time, as soon as something comes open I will reach out

## 2023-11-24 NOTE — Therapy (Signed)
OUTPATIENT PHYSICAL THERAPY LOWER EXTREMITY TREATMENT   Patient Name: Christina Sellers MRN: [id number] DOB:1941/02/11, 82 y.o., female Today's Date: 11/25/2023  END OF SESSION:  PT End of Session - 11/25/23 0841     Visit Number 3    Number of Visits 16    Date for PT Re-Evaluation 12/23/23    PT Start Time 1515    PT Stop Time 1558    PT Time Calculation (min) 43 min    Activity Tolerance Patient tolerated treatment well    Behavior During Therapy WFL for tasks assessed/performed              Past Medical History:  Diagnosis Date   Abnormal Pap smear of vagina    Allergic sinusitis    Anxiety    Atherosclerotic peripheral vascular disease (HCC)    Carotid artery stenosis    Cellulitis    COPD with asthma (HCC)    not an issue now   Depression    Diabetes mellitus type 2, uncomplicated (HCC)    DJD (degenerative joint disease)    Elevated cholesterol    Elevated MCV    secondary to alchol   GERD (gastroesophageal reflux disease)    not an issue now.   Gout    Hearing difficulty    bilateral hearing aids   History of recurrent UTIs    Hypertension    loss weight'off meds now"   Hypothyroidism    IBS (irritable bowel syndrome)    Insomnia    Labial fusion    Osteoarthritis of left knee    Osteopenia    Recurrent vaginitis    Sjogren's syndrome (HCC)    Syncope 11.7.14   secondary to dehydration and possible hypoglycemia   Tendonitis    Thyroid disease    Transfusion history    age 35 "anemia"   Past Surgical History:  Procedure Laterality Date   CATARACT EXTRACTION, BILATERAL Bilateral    CHOLECYSTECTOMY     open   COLONOSCOPY WITH PROPOFOL N/A 03/23/2016   Procedure: COLONOSCOPY WITH PROPOFOL;  Surgeon: Charolett Bumpers, MD;  Location: [facility];  Service: Endoscopy;  Laterality: N/A;   EYE SURGERY     FOOT SURGERY Left    retained hardware   KNEE SURGERY Left    scope    TONSILLECTOMY     TOTAL HIP ARTHROPLASTY Right 03/30/2021    Procedure: RIGHTTOTAL HIP ARTHROPLASTY ANTERIOR APPROACH;  Surgeon: Gean Birchwood, MD;  Location: [facility];  Service: Orthopedics;  Laterality: Right;   Patient Active Problem List   Diagnosis Date Noted   Paroxysmal SVT (supraventricular tachycardia) (HCC) 06/30/2023   Bilateral carotid artery stenosis 01/21/2023   Abnormal finding of blood chemistry, unspecified 05/13/2021   Acquired hallux valgus 05/13/2021   Age-related osteoporosis without current pathological fracture 05/13/2021   Allergic rhinitis 05/13/2021   Anxiety disorder 05/13/2021   Apnea 05/13/2021   Avascular necrosis of bone of hip (HCC) 05/13/2021   Benign paroxysmal positional vertigo 05/13/2021   Bilateral tinnitus 05/13/2021   Bronchospasm 05/13/2021   Chronic kidney disease, stage 3 unspecified (HCC) 05/13/2021   Chronic obstructive pulmonary disease, unspecified (HCC) 05/13/2021   Constipation 05/13/2021   Costochondritis 05/13/2021   Disequilibrium 05/13/2021   Dysphagia 05/13/2021   DOE (dyspnea on exertion) 05/13/2021   Elevated blood-pressure reading, without diagnosis of hypertension 05/13/2021   Fatigue 05/13/2021   Gastro-esophageal reflux disease without esophagitis 05/13/2021   Generalized hyperhidrosis 05/13/2021   Gout 05/13/2021   Hair loss disorder 05/13/2021  Hematochezia 05/13/2021   Hemorrhage of colon due to diverticulosis 05/13/2021   Hypersomnia 05/13/2021   Indigestion 05/13/2021   Labyrinthitis of both ears 05/13/2021   Left flank pain 05/13/2021   Leukocytosis 05/13/2021   Lichen planus 05/13/2021   Localized swelling, mass and lump, neck 05/13/2021   Major depression, single episode 05/13/2021   Memory impairment 05/13/2021   Myalgia 05/13/2021   Night sweats 05/13/2021   Noninflammatory disorder of vagina 05/13/2021   Obstructive sleep apnea syndrome 05/13/2021   Osteoarthritis of knee 05/13/2021   Other long term (current) drug therapy 05/13/2021   Pharyngeal dysphagia  05/13/2021   Sciatica 05/13/2021   Sjogren's syndrome (HCC) 05/13/2021   Sleep disorder 05/13/2021   Status post hip replacement 05/13/2021   Superficial phlebitis 05/13/2021   Tear film insufficiency 05/13/2021   Urinary tract infectious disease 05/13/2021   Urticaria 05/13/2021   Ventral hernia without obstruction or gangrene 05/13/2021   Vitamin D deficiency 05/13/2021   H/O total hip arthroplasty, right 03/30/2021   Osteoarthritis of right hip 03/25/2021   Orthostatic hypotension 02/27/2021   IBS (irritable bowel syndrome) 02/11/2021   Hyponatremia 02/11/2021   Acute cystitis without hematuria    Chronic back pain    E-coli UTI 02/01/2021   Dehydration with hyponatremia 02/01/2021   Chronic heart failure with preserved ejection fraction (HCC) 02/01/2021   Hypertension associated with diabetes (HCC) 01/21/2021   Hyperlipidemia associated with type 2 diabetes mellitus (HCC) 01/21/2021   Aortic atherosclerosis (HCC) 01/21/2021   Chronic low back pain 10/05/2019   Type 2 diabetes mellitus with hyperglycemia, without long-term current use of insulin (HCC) 02/21/2019   Hypothyroidism 02/21/2019   Pain of left hip joint 12/08/2018   Dermatochalasis of both eyelids 07/04/2018   Viral labyrinthitis 01/13/2017   Acquired spondylolisthesis 04/30/2014   Degeneration of lumbar intervertebral disc 04/30/2014   Osteopenia 11/22/2013   Labial fusion     PCP: Hillard Danker MD  REFERRING PROVIDER: Hillard Danker MD   REFERRING DIAG:  Diagnosis  R29.898 (ICD-10-CM) - Other symptoms and signs involving the musculoskeletal system    THERAPY DIAG:  Other abnormalities of gait and mobility  Muscle weakness (generalized)  Rationale for Evaluation and Treatment: Rehabilitation  ONSET DATE: long standing muscle weakness   SUBJECTIVE:   SUBJECTIVE STATEMENT: The patient reports she has right hip and left knee pain at times.  She otherwise reports she is doing well.  She has had no  significant problems today. PERTINENT HISTORY: Anxiety, PVD, HTN, DJD, depression, left foot surgery, left knee OA, hard of hearing, left knee scope; right THA, Osteopenia PAIN:  Patient not having pain at this time  PRECAUTIONS: None  RED FLAGS: None   WEIGHT BEARING RESTRICTIONS: No  FALLS:  Has patient fallen in last 6 months? No  LIVING ENVIRONMENT: Steps inside her house. Fine going up. Has to go slow going down  OCCUPATION:  Retired.   Hobbies: none   PLOF: Independent with household mobility with device  PATIENT GOALS: " to get a little more steady"  NEXT MD VISIT:  Nothing scheduled   OBJECTIVE:  Note: Objective measures were completed at Evaluation unless otherwise noted.  DIAGNOSTIC FINDINGS:    PATIENT SURVEYS:  FOTO 51 % 59 expected at 10 visits   COGNITION: Overall cognitive status: Within functional limits for tasks assessed     SENSATION: WFL   POSTURE: No Significant postural limitations  PALPATION: No TTP    LOWER EXTREMITY MMT:  MMT Right eval Left eval  Hip flexion 12.4 13.8  Hip extension    Hip abduction 8.4 8.6  Hip adduction    Hip internal rotation    Hip external rotation    Knee flexion    Knee extension 23.1 16.1  Ankle dorsiflexion    Ankle plantarflexion    Ankle inversion    Ankle eversion     (Blank rows = not tested)   FUNCTIONAL TESTS:  5x sit to stand 18 sec  GAIT: Mild forward flexion on the walker; decreased hip flexion    Balance:   Narrow base SBA  NArrow base eyes closed min a   Tandem min a bialteral   TODAY'S TREATMENT:                                                                                                                              DATE:    12/19 Nu-step 4 min   Seated clam GTB (4" step under feet) x30 Seated marching 2# 2x10 (4" step under feet)  Airex: Narrow base eyes closed 2 x 30 seconds Tandem stance 2 x 30 seconds each leg forward Heel toe rock 2 x 15 March 2 x  10 All with contact-guard assist  2 inch step up 2 x 10 each leg Hurdle step lateral 2 x 10 each direction   12/11: Seated clam GTB (4" step under feet) x30 Seated marching 2# 2x10 (4" step under feet) Standing hip 3 way 2x10ea Standing HR/TR x20 Hurdle step overs -one at a time 4 hurdles with CGA (gait belt) x4  - then step over step without rail- 2hurdles CGA on gait belt Romberg stance on airex 2x 30sec Standing marching on airex 2x10 SBA Standing march no UE support x10 CGA Sit to stands 2x10 from chair Gait training no AD, cues for step length and heel contact    Eval: Access Code: [email or web address] URL: [email or web address] Date: 10/28/2023 Prepared by: Lorayne Bender  Exercises - Seated Knee Extension with Resistance  - 1 x daily - 7 x weekly - 3 sets - 10 reps - Seated March with Resistance  - 1 x daily - 7 x weekly - 3 sets - 10 reps - Seated Hip Abduction with Resistance  - 1 x daily - 7 x weekly - 3 sets - 10 reps  PATIENT EDUCATION:  Education details: HEP, symptom management, benefits of exercises, reviewed tests and measures  Person educated: Patient Education method: Explanation, Demonstration, Tactile cues, Verbal cues, and Handouts Education comprehension: verbalized understanding, returned demonstration, verbal cues required, tactile cues required, and needs further education  HOME EXERCISE PROGRAM: Access Code: [email or web address] URL: [email or web address] Date: 10/28/2023 Prepared by: Lorayne Bender  Exercises - Seated Knee Extension with Resistance  - 1 x daily - 7 x weekly - 3 sets - 10 reps - Seated March with Resistance  - 1 x daily - 7 x weekly - 3 sets - 10 reps - Seated Hip Abduction with Resistance  -  1 x daily - 7 x weekly - 3 sets - 10 reps  ASSESSMENT:  CLINICAL IMPRESSION: Patient continues to require contact-guard assist for balance exercises.  She tolerated treatment well.  At times she reported minor pain in her hip.   There is nothing significant and the pain did not last.  Will continue to focus on balance and lower extremity strengthening exercises.  OBJECTIVE IMPAIRMENTS: Abnormal gait, decreased activity tolerance, decreased balance, decreased knowledge of use of DME, difficulty walking, and decreased strength.   ACTIVITY LIMITATIONS: carrying, lifting, standing, squatting, transfers, and locomotion level  PARTICIPATION LIMITATIONS: meal prep, cleaning, laundry, driving, shopping, and yard work  PERSONAL FACTORS: Age, Past/current experiences, and 1-2 comorbidities: right hip replacement, multiple   are also affecting patient's functional outcome.   REHAB POTENTIAL:  good   CLINICAL DECISION MAKING: Evolving/moderate complexity declining general mobility   EVALUATION COMPLEXITY: Moderate   GOALS: Goals reviewed with patient? Yes  SHORT TERM GOALS: Target date: 11/25/2023   Patient wil increase gross bilateral LE strength by 5 lbs  Baseline: Goal status: INITIAL  2.  Patient will reduce 5x sit to stand time by 6 seconds Baseline:  Goal status: INITIAL  3.  Patient will be independent and complaint with base HEP  Baseline:  Goal status: INITIAL   LONG TERM GOALS: Target date: 12/23/2023    Patient will rpeort decreased fatigue mbaulting in the community  Baseline:  Goal status: INITIAL  2.  Patient will go down steps with self reported improved stability   Baseline:  Goal status: INITIAL  3.  Patient will be comfortable with gym program if she chooses to use her gym membership  Baseline:  Goal status: INITIAL   PLAN:  PT FREQUENCY: 1-2x/week  PT DURATION: 8 weeks  PLAN FOR NEXT SESSION: consider stair training; review seated series she was given. Consider supine series or standing series of exercises. Consider progression out into the gym.      Dessie Coma, PT 11/25/2023, 8:42 AM

## 2023-11-25 ENCOUNTER — Encounter (HOSPITAL_BASED_OUTPATIENT_CLINIC_OR_DEPARTMENT_OTHER): Payer: Self-pay | Admitting: Physical Therapy

## 2023-11-28 ENCOUNTER — Ambulatory Visit: Payer: Medicare Other | Admitting: Neurology

## 2023-11-28 NOTE — Telephone Encounter (Signed)
Pt's daughter Lear Ng upset because pt's appt were canceled twice due to provider. Requested sooner appt but none available during the time frame needed. Requesting to speak with office manager. Sent message to [facility]

## 2023-11-29 ENCOUNTER — Ambulatory Visit (INDEPENDENT_AMBULATORY_CARE_PROVIDER_SITE_OTHER): Payer: Medicare Other | Admitting: Neurology

## 2023-11-29 ENCOUNTER — Encounter: Payer: Self-pay | Admitting: Neurology

## 2023-11-29 ENCOUNTER — Telehealth: Payer: Self-pay | Admitting: Neurology

## 2023-11-29 VITALS — BP 158/59 | HR 60 | Ht <= 58 in | Wt 113.0 lb

## 2023-11-29 DIAGNOSIS — G308 Other Alzheimer's disease: Secondary | ICD-10-CM

## 2023-11-29 DIAGNOSIS — R413 Other amnesia: Secondary | ICD-10-CM | POA: Diagnosis not present

## 2023-11-29 DIAGNOSIS — F02A0 Dementia in other diseases classified elsewhere, mild, without behavioral disturbance, psychotic disturbance, mood disturbance, and anxiety: Secondary | ICD-10-CM

## 2023-11-29 DIAGNOSIS — F0280 Dementia in other diseases classified elsewhere without behavioral disturbance: Secondary | ICD-10-CM | POA: Diagnosis not present

## 2023-11-29 DIAGNOSIS — G309 Alzheimer's disease, unspecified: Secondary | ICD-10-CM

## 2023-11-29 MED ORDER — MEMANTINE HCL 10 MG PO TABS: 10.0000 mg | ORAL_TABLET | Freq: Two times a day (BID) | ORAL | 3 refills | Status: DC

## 2023-11-29 MED ORDER — MEMANTINE HCL 28 X 5 MG & 21 X 10 MG PO TABS
ORAL_TABLET | ORAL | 12 refills | Status: DC
Start: 2023-11-29 — End: 2024-01-04

## 2023-11-29 NOTE — Patient Instructions (Signed)
I had a long discussion with the patient and her daughter regarding her progressive memory and cognitive impairment over the last 6 months which is likely due to Alzheimer's dementia.  I recommended further evaluation with checking memory panel labs, EEG, apoprotein he levels and amyloid PET scan to assess Alzheimer's risk.  Trial of Namenda starter pack and if tolerated increase to 10 mg twice daily.  We also discussed memory compensation strategies and encouraged her to increase participation in cognitively challenging activities like solving crossword puzzles, playing bridge and sudoku.  Return for follow-up in 6 months or call earlier if necessary.  Memantine( Namenda ) Tablets What is this medication? MEMANTINE (MEM an teen) treats memory loss and confusion (dementia) in people who have Alzheimer disease. It works by improving attention, memory, and the ability to engage in daily activities. It is not a cure for dementia or Alzheimer disease. This medicine may be used for other purposes; ask your health care provider or pharmacist if you have questions. COMMON BRAND NAME(S): Namenda What should I tell my care team before I take this medication? They need to know if you have any of these conditions: Kidney disease Liver disease Seizures Trouble passing urine An unusual or allergic reaction to memantine, other medications, foods, dyes, or preservatives Pregnant or trying to get pregnant Breast-feeding How should I use this medication? Take this medication by mouth with water. Follow the directions on the prescription label. You may take this medication with or without food. Take your doses at regular intervals. Do not take your medication more often than directed. Continue to take your medication even if you feel better. Do not stop taking except on the advice of your care team. Talk to your care team about the use of this medication in children. Special care may be needed Overdosage: If you  think you have taken too much of this medicine contact a poison control center or emergency room at once. NOTE: This medicine is only for you. Do not share this medicine with others. What if I miss a dose? If you miss a dose, take it as soon as you can. If it is almost time for your next dose, take only that dose. Do not take double or extra doses. If you do not take your medication for several days, contact your care team. Your dose may need to be changed. What may interact with this medication? Acetazolamide Amantadine Cimetidine Dextromethorphan Dofetilide Hydrochlorothiazide Ketamine Metformin Methazolamide Quinidine Ranitidine Sodium bicarbonate Triamterene This list may not describe all possible interactions. Give your health care provider a list of all the medicines, herbs, non-prescription drugs, or dietary supplements you use. Also tell them if you smoke, drink alcohol, or use illegal drugs. Some items may interact with your medicine. What should I watch for while using this medication? Visit your care team for regular checks on your progress. Check with your care team if there is no improvement in your symptoms or if they get worse. This medication may affect your coordination, reaction time, or judgment. Do not drive or operate machinery until you know how this medication affects you. Sit up or stand slowly to reduce the risk of dizzy or fainting spells. Drinking alcohol with this medication can increase the risk of these side effects. What side effects may I notice from receiving this medication? Side effects that you should report to your care team as soon as possible: Allergic reactions--skin rash, itching, hives, swelling of the face, lips, tongue, or throat Side effects  that usually do not require medical attention (report to your care team if they continue or are bothersome): Confusion Constipation Diarrhea Dizziness Headache This list may not describe all possible  side effects. Call your doctor for medical advice about side effects. You may report side effects to FDA at 1-800-FDA-1088. Where should I keep my medication? Keep out of the reach of children. Store at room temperature between 15 degrees and 30 degrees C (59 degrees and 86 degrees F). Throw away any unused medication after the expiration date. NOTE: This sheet is a summary. It may not cover all possible information. If you have questions about this medicine, talk to your doctor, pharmacist, or health care provider.  2024 Elsevier/Gold Standard (2021-12-15 00:00:00)

## 2023-11-29 NOTE — Telephone Encounter (Signed)
Pt needed to be scheduled for 6 months. Pt daughter states she lives in [place] and would like to follow-up asap and would like few months notice of appt. When informed Christina Sellers is booked out until September pt daughter requested a message be sent to Dr. Pearlean Sellers and asked to be worked in for 6 month follow up.

## 2023-11-29 NOTE — Progress Notes (Addendum)
[facility] [address] [place]. [place] [address] [phone]       OFFICE CONSULT NOTE  [name] Date of Birth:  1940-12-31 Medical Record Number:  [id number]   Referring MD: Hillard Danker  Reason for Referral: Dementia  HPI: [name] is a pleasant 83 year old Caucasian lady seen today for initial office consultation visit for dementia.  History is obtained from the patient and her daughter is accompanying her as well as review of electronic medical records.  I personally reviewed pertinent available imaging films in PACS.  She has past medical history of hypothyroidism, hypertension, COPD, anxiety, vitamin D deficiency, sciatica, gout, peripheral vascular disease, depression, heart disease, vascular necrosis of the hip and chronic kidney disease and prediabetes and hyperlipidemia.  Patient has had memory loss and cognitive impairment for the last 6 months as per the daughter which seems to be progressive.  She is living at home but has required increasing support and now has 24-hour caregivers.  She has poor short-term memory and cannot remember recent information.  She also gets confused easily.  She is quite disoriented at times.  She does have sundowning and is more confused at nighttime.  She is able to ambulate independently with a walker.  She does not drive.  She needs help with changing her close and taking a shower.  She can feed herself.  She has not exhibited any anger or violent behavior.  She does not have any delusions or hallucinations.  She does have a half sister who had severe dementia.  She has not had any lab work for reversible causes of memory loss or tried any dementia medications.  MRI scan of the brain on 10/03/2023 personally reviewed shows age-appropriate changes of small vessel disease and mild degree of generalized cerebral atrophy.  Lab work from 07/18/2023 at primary care physician's office shows normal iron studies, CBC and  blood chemistries.  She has no prior history of strokes, TIAs, seizures significant head injury with loss of consciousness.  ROS:   14 system review of systems is positive for memory loss, cognitive impairment, gait difficulty, sundowning, confusion and all other systems negative  PMH:  Past Medical History:  Diagnosis Date   Abnormal Pap smear of vagina    Allergic sinusitis    Anxiety    Atherosclerotic peripheral vascular disease (HCC)    Carotid artery stenosis    Cellulitis    COPD with asthma (HCC)    not an issue now   Depression    Diabetes mellitus type 2, uncomplicated (HCC)    DJD (degenerative joint disease)    Elevated cholesterol    Elevated MCV    secondary to alchol   GERD (gastroesophageal reflux disease)    not an issue now.   Gout    Hearing difficulty    bilateral hearing aids   History of recurrent UTIs    Hypertension    loss weight'off meds now"   Hypothyroidism    IBS (irritable bowel syndrome)    Insomnia    Labial fusion    Osteoarthritis of left knee    Osteopenia    Recurrent vaginitis    Sjogren's syndrome (HCC)    Syncope 11.7.14   secondary to dehydration and possible hypoglycemia   Tendonitis    Thyroid disease    Transfusion history    age 20 "anemia"    Social History:  Social History   Socioeconomic History   Marital status: Widowed  Spouse name: Not on file   Number of children: Not on file   Years of education: Not on file   Highest education level: Not on file  Occupational History   Not on file  Tobacco Use   Smoking status: Former    Current packs/day: 0.00    Types: Cigarettes    Quit date: 02/21/1982    Years since quitting: 41.7   Smokeless tobacco: Never  Vaping Use   Vaping status: Never Used  Substance and Sexual Activity   Alcohol use: No   Drug use: No   Sexual activity: Never    Birth control/protection: Post-menopausal  Other Topics Concern   Not on file  Social History Narrative   Not on file    Social Drivers of Health   Financial Resource Strain: Low Risk  (07/15/2023)   Overall Financial Resource Strain (CARDIA)    Difficulty of Paying Living Expenses: Not hard at all  Food Insecurity: No Food Insecurity (07/15/2023)   Hunger Vital Sign    Worried About Running Out of Food in the Last Year: Never true    Ran Out of Food in the Last Year: Never true  Transportation Needs: No Transportation Needs (07/15/2023)   PRAPARE - Administrator, Civil Service (Medical): No    Lack of Transportation (Non-Medical): No  Physical Activity: Inactive (07/15/2023)   Exercise Vital Sign    Days of Exercise per Week: 0 days    Minutes of Exercise per Session: 0 min  Stress: Not on file  Social Connections: Not on file  Intimate Partner Violence: Not on file    Medications:   Current Outpatient Medications on File Prior to Visit  Medication Sig Dispense Refill   acetaminophen (TYLENOL) 650 MG CR tablet 2 tablets as needed for pain.     amLODipine (NORVASC) 5 MG tablet Take 5 mg by mouth daily.     ascorbic acid (VITAMIN C) 500 MG tablet Take 500 mg by mouth daily.     cetirizine (ZYRTEC ALLERGY) 10 MG tablet as needed for allergies.     Cholecalciferol (VITAMIN D3) 50 MCG (2000 UT) capsule Take 2,000 mg by mouth daily.     clobetasol ointment (TEMOVATE) 0.05 % Apply topically 2 (two) times a week. 30 g 1   cloNIDine (CATAPRES) 0.1 MG tablet Take 0.1 mg by mouth 2 (two) times daily.     denosumab (PROLIA) 60 MG/ML SOSY injection Inject 60 mg into the skin every 6 (six) months.     diclofenac sodium (VOLTAREN) 1 % GEL Apply 1 application topically 2 (two) times daily as needed (to painful sites).     empagliflozin (JARDIANCE) 10 MG TABS tablet Take by mouth daily.     escitalopram (LEXAPRO) 5 MG tablet Take 5 mg by mouth daily.     fluticasone (FLONASE) 50 MCG/ACT nasal spray Place 1 spray into both nostrils daily as needed (for seasonal allergies).     furosemide (LASIX) 20 MG  tablet Take 1 tablet (20 mg total) by mouth daily. 90 tablet 3   levothyroxine (SYNTHROID) 75 MCG tablet Take 75 mcg by mouth every morning.     meloxicam (MOBIC) 7.5 MG tablet Take by mouth daily as needed.     Polyvinyl Alcohol-Povidone PF (REFRESH) 1.4-0.6 % SOLN as needed.     RESTASIS 0.05 % ophthalmic emulsion Place 1 drop into both eyes 2 (two) times daily.     rosuvastatin (CRESTOR) 5 MG tablet Take 5 mg by  mouth daily.     spironolactone (ALDACTONE) 25 MG tablet Take 1 tablet (25 mg total) by mouth daily. 90 tablet 3   sucralfate (CARAFATE) 1 g tablet Take 1 g by mouth 2 (two) times daily.     vitamin E 200 UNIT capsule Take 200 Units by mouth daily.     No current facility-administered medications on file prior to visit.    Allergies:   Allergies  Allergen Reactions   Tape Other (See Comments)    SKIN IS VERY THIN- TAPE TEARS VERY EASILY!!   Celexa [Citalopram Hydrobromide] Other (See Comments)    Nightmares    Fluconazole Other (See Comments)    Unknown reaction   Green Tea (Camellia Sinensis)     Other Reaction(s): mouth ulcers   Green Tea Leaf Ext Other (See Comments)    Mouth ulcers   Hydralazine Hcl     Other Reaction(s): easy bruising   Kenalog [Triamcinolone] Other (See Comments)    Spinal injection caused sweats   Levaquin [Levofloxacin] Hives   Losartan Potassium-Hctz Other (See Comments)    Night sweats/ fatigue   Magnesium Citrate Other (See Comments)   Methocarbamol Other (See Comments)    500 mg is too much- can tolerate a lesser dose   Nitrofurantoin Macrocrystal Other (See Comments)    Possible vertigo   Scopolamine Other (See Comments)    Memory change   Sulfa Antibiotics Other (See Comments)    Reaction not recalled by the patient   Tea     Other reaction(s): Not available   Tramadol     Makes pt feel loopy   Trimethoprim Hives    Physical Exam General: Frail elderly Caucasian lady seated, in no evident distress Head: head normocephalic  and atraumatic.   Neck: supple with no carotid or supraclavicular bruits Cardiovascular: regular rate and rhythm, no murmurs Musculoskeletal: no deformity Skin:  no rash/petichiae Vascular:  Normal pulses all extremities  Neurologic Exam Mental Status: Awake and fully alert. Oriented to place and time. Recent and remote memory poor attention span, concentration and fund of knowledge diminished. Mood and affect appropriate.  Mini-Mental status exam score 20/30.  With deficits in recall, attention, comprehension, orientation.  Clock drawing 3/4.  Able to name only 4 animals which can walk on 4 legs. Cranial Nerves: Fundoscopic exam reveals sharp disc margins. Pupils equal, briskly reactive to light. Extraocular movements full without nystagmus. Visual fields full to confrontation. Hearing significantly impaired bilaterally.. Facial sensation intact. Face, tongue, palate moves normally and symmetrically.  Motor: Normal bulk and tone. Normal strength in all tested extremity muscles. Sensory.: intact to touch , pinprick , position and vibratory sensation.  Coordination: Rapid alternating movements normal in all extremities. Finger-to-nose and heel-to-shin performed accurately bilaterally. Gait and Station: Arises from chair without difficulty. Stance is slightly stooped.  Uses a walker.  Gait demonstrates slight apraxia while turning..  Tandem walking not tested.   Reflexes: 1+ and symmetric. Toes downgoing.        11/29/2023   11:04 AM  MMSE - Mini Mental State Exam  Orientation to time 3  Orientation to Place 5  Registration 3  Attention/ Calculation 0  Recall 1  Language- name 2 objects 2  Language- repeat 1  Language- follow 3 step command 2  Language- read & follow direction 1  Write a sentence 1  Copy design 1  Total score 20     ASSESSMENT: 82 year old pleasant Caucasian lady with 15-month history of progressive cognitive decline and  memory loss likely due to Alzheimer's  dementia.     PLAN:I had a long discussion with the patient and her daughter regarding her progressive memory and cognitive impairment over the last 6 months which is likely due to Alzheimer's dementia.  I recommended further evaluation with checking memory panel labs, EEG, apoprotein E profile and amyloid PET scan to assess Alzheimer's risk.  Since patient is living at home and has good caregiver support starting treatment at this point may perhaps keep her in the present living situation for a few years at least.  Trial of Namenda starter pack and if tolerated increase to 10 mg twice daily.  We also discussed memory compensation strategies and encouraged her to increase participation in cognitively challenging activities like solving crossword puzzles, playing bridge and sudoku.  Return for follow-up in 6 months or call earlier if necessary. Greater than 50% time during this 45-minute consultation visit was spent in counseling and coordination of care about her memory loss and cognitive impairment and discussion about evaluation and treatment and answering questions. Delia Heady, MD Note: This document was prepared with digital dictation and possible smart phrase technology. Any transcriptional errors that result from this process are unintentional.

## 2023-12-01 ENCOUNTER — Telehealth: Payer: Self-pay | Admitting: Neurology

## 2023-12-01 NOTE — Telephone Encounter (Signed)
Pt's daughter called wanting to speak to the RN regarding the notes of the last OV. She states that some things in the notes are incorrect and she would like to discuss and also discuss when the tests that were recommended will take place. Please advise.

## 2023-12-02 ENCOUNTER — Ambulatory Visit (HOSPITAL_BASED_OUTPATIENT_CLINIC_OR_DEPARTMENT_OTHER): Payer: Medicare Other | Admitting: Physical Therapy

## 2023-12-02 ENCOUNTER — Other Ambulatory Visit: Payer: Self-pay | Admitting: Neurology

## 2023-12-02 DIAGNOSIS — Z23 Encounter for immunization: Secondary | ICD-10-CM | POA: Diagnosis not present

## 2023-12-02 DIAGNOSIS — R634 Abnormal weight loss: Secondary | ICD-10-CM | POA: Diagnosis not present

## 2023-12-02 DIAGNOSIS — E113212 Type 2 diabetes mellitus with mild nonproliferative diabetic retinopathy with macular edema, left eye: Secondary | ICD-10-CM | POA: Diagnosis not present

## 2023-12-02 DIAGNOSIS — R29898 Other symptoms and signs involving the musculoskeletal system: Secondary | ICD-10-CM | POA: Diagnosis not present

## 2023-12-02 MED ORDER — FOLIC ACID 1 MG PO TABS: 1.0000 mg | ORAL_TABLET | Freq: Every day | ORAL | 3 refills | Status: DC

## 2023-12-02 NOTE — Progress Notes (Signed)
Kindly inform the patient that lab work for reversible causes of memory loss shows that her thyroid is underactive and she needs to see her primary care physician Dr. Margaretann Loveless for replacement.  Also homocystine levels are elevated which can affect her memory I am going to prescribe folic acid 1 mg tablet to be taken daily which will help bring this down

## 2023-12-05 NOTE — Telephone Encounter (Signed)
Called the daughter back. There was no answer and Mailbox was full. Will try messaging on mychart and/or attempt to call back at later time.   **If daughter calls back, please offer sooner apt June 11 that was provided in another phone note from where she called. Also need to advise her of lab work results that were completed.  "Per Dr Pearlean Brownie in his plan of care, he is ordering a Pet Amyloid scan which looks to see if she has the Alzhiemer's protein markers that are shown to be present on PET amyloid scan.  I had a long discussion with the patient and her daughter regarding her progressive memory and cognitive impairment over the last 6 months which is likely due to Alzheimer's dementia.  I recommended further evaluation with checking memory panel labs, EEG, apoprotein he levels and amyloid PET scan to assess Alzheimer's risk.  Trial of Namenda starter pack and if tolerated increase to 10 mg twice daily.  We also discussed memory compensation strategies and encouraged her to increase participation in cognitively challenging activities like solving crossword puzzles, playing bridge and sudoku.  Return for follow-up in 6 months or call earlier if necessary.

## 2023-12-05 NOTE — Telephone Encounter (Signed)
Spoke to [employer] and was informed that pt's daughter has been called and VM has been left to return call. Please see the note on 12/26 for more information.

## 2023-12-06 ENCOUNTER — Telehealth: Payer: Self-pay

## 2023-12-06 NOTE — Telephone Encounter (Signed)
-----   Message from Eather Popp sent at 12/02/2023  8:16 AM EST ----- Kindly inform the patient that lab work for reversible causes of memory loss shows that her thyroid  is underactive and she needs to see her primary care physician Dr. Dwight for replacement.  Also homocystine levels are elevated which can affect her memory I am going to prescribe folic acid  1 mg tablet to be taken daily which will help bring this down

## 2023-12-06 NOTE — Telephone Encounter (Signed)
 I contacted pt daughter, per DPR, informed her that lab work for reversible causes of memory loss shows that her thyroid  is underactive and she needs to see her primary care physician Dr. Dwight for replacement.  Also homocystine levels are elevated which can affect her memory, Dr Rosemarie has prescribe folic acid  1 mg tablet to be taken daily which will help bring this down.  Informed me that she has already been in contact with primary care.  Mother is scheduled to have labs and a visit next week.  She verbally understood results and was appreciative for the call.

## 2023-12-08 ENCOUNTER — Encounter (HOSPITAL_BASED_OUTPATIENT_CLINIC_OR_DEPARTMENT_OTHER): Payer: Self-pay

## 2023-12-08 ENCOUNTER — Ambulatory Visit (HOSPITAL_BASED_OUTPATIENT_CLINIC_OR_DEPARTMENT_OTHER): Payer: Medicare Other | Attending: Internal Medicine

## 2023-12-08 DIAGNOSIS — R2689 Other abnormalities of gait and mobility: Secondary | ICD-10-CM | POA: Insufficient documentation

## 2023-12-08 DIAGNOSIS — M6281 Muscle weakness (generalized): Secondary | ICD-10-CM | POA: Insufficient documentation

## 2023-12-08 NOTE — Therapy (Signed)
 OUTPATIENT PHYSICAL THERAPY LOWER EXTREMITY TREATMENT   Patient Name: Christina Sellers MRN: [id number] DOB:[birth year], 83 y.o., female Today's Date: 12/08/2023  END OF SESSION:  PT End of Session - 12/08/23 1430     Visit Number 4    Number of Visits 16    Date for PT Re-Evaluation 12/23/23    PT Start Time 1433    PT Stop Time 1515    PT Time Calculation (min) 42 min    Equipment Utilized During Treatment Gait belt    Activity Tolerance Patient tolerated treatment well    Behavior During Therapy WFL for tasks assessed/performed               Past Medical History:  Diagnosis Date   Abnormal Pap smear of vagina    Allergic sinusitis    Anxiety    Atherosclerotic peripheral vascular disease (HCC)    Carotid artery stenosis    Cellulitis    COPD with asthma (HCC)    not an issue now   Depression    Diabetes mellitus type 2, uncomplicated (HCC)    DJD (degenerative joint disease)    Elevated cholesterol    Elevated MCV    secondary to alchol   GERD (gastroesophageal reflux disease)    not an issue now.   Gout    Hearing difficulty    bilateral hearing aids   History of recurrent UTIs    Hypertension    loss weight'off meds now   Hypothyroidism    IBS (irritable bowel syndrome)    Insomnia    Labial fusion    Osteoarthritis of left knee    Osteopenia    Recurrent vaginitis    Sjogren's syndrome (HCC)    Syncope 11.7.14   secondary to dehydration and possible hypoglycemia   Tendonitis    Thyroid  disease    Transfusion history    age 28 anemia   Past Surgical History:  Procedure Laterality Date   CATARACT EXTRACTION, BILATERAL Bilateral    CHOLECYSTECTOMY     open   COLONOSCOPY WITH PROPOFOL  N/A 03/23/2016   Procedure: COLONOSCOPY WITH PROPOFOL ;  Surgeon: Gladis MARLA Louder, MD;  Location: [facility];  Service: Endoscopy;  Laterality: N/A;   EYE SURGERY     FOOT SURGERY Left    retained hardware   KNEE SURGERY Left    scope    TONSILLECTOMY      TOTAL HIP ARTHROPLASTY Right 03/30/2021   Procedure: RIGHTTOTAL HIP ARTHROPLASTY ANTERIOR APPROACH;  Surgeon: Liam Lerner, MD;  Location: [facility];  Service: Orthopedics;  Laterality: Right;   Patient Active Problem List   Diagnosis Date Noted   Paroxysmal SVT (supraventricular tachycardia) (HCC) 06/30/2023   Bilateral carotid artery stenosis 01/21/2023   Abnormal finding of blood chemistry, unspecified 05/13/2021   Acquired hallux valgus 05/13/2021   Age-related osteoporosis without current pathological fracture 05/13/2021   Allergic rhinitis 05/13/2021   Anxiety disorder 05/13/2021   Apnea 05/13/2021   Avascular necrosis of bone of hip (HCC) 05/13/2021   Benign paroxysmal positional vertigo 05/13/2021   Bilateral tinnitus 05/13/2021   Bronchospasm 05/13/2021   Chronic kidney disease, stage 3 unspecified (HCC) 05/13/2021   Chronic obstructive pulmonary disease, unspecified (HCC) 05/13/2021   Constipation 05/13/2021   Costochondritis 05/13/2021   Disequilibrium 05/13/2021   Dysphagia 05/13/2021   DOE (dyspnea on exertion) 05/13/2021   Elevated blood-pressure reading, without diagnosis of hypertension 05/13/2021   Fatigue 05/13/2021   Gastro-esophageal reflux disease without esophagitis 05/13/2021   Generalized hyperhidrosis 05/13/2021  Gout 05/13/2021   Hair loss disorder 05/13/2021   Hematochezia 05/13/2021   Hemorrhage of colon due to diverticulosis 05/13/2021   Hypersomnia 05/13/2021   Indigestion 05/13/2021   Labyrinthitis of both ears 05/13/2021   Left flank pain 05/13/2021   Leukocytosis 05/13/2021   Lichen planus 05/13/2021   Localized swelling, mass and lump, neck 05/13/2021   Major depression, single episode 05/13/2021   Memory impairment 05/13/2021   Myalgia 05/13/2021   Night sweats 05/13/2021   Noninflammatory disorder of vagina 05/13/2021   Obstructive sleep apnea syndrome 05/13/2021   Osteoarthritis of knee 05/13/2021   Other long term (current) drug  therapy 05/13/2021   Pharyngeal dysphagia 05/13/2021   Sciatica 05/13/2021   Sjogren's syndrome (HCC) 05/13/2021   Sleep disorder 05/13/2021   Status post hip replacement 05/13/2021   Superficial phlebitis 05/13/2021   Tear film insufficiency 05/13/2021   Urinary tract infectious disease 05/13/2021   Urticaria 05/13/2021   Ventral hernia without obstruction or gangrene 05/13/2021   Vitamin D  deficiency 05/13/2021   H/O total hip arthroplasty, right 03/30/2021   Osteoarthritis of right hip 03/25/2021   Orthostatic hypotension 02/27/2021   IBS (irritable bowel syndrome) 02/11/2021   Hyponatremia 02/11/2021   Acute cystitis without hematuria    Chronic back pain    E-coli UTI 02/01/2021   Dehydration with hyponatremia 02/01/2021   Chronic heart failure with preserved ejection fraction (HCC) 02/01/2021   Hypertension associated with diabetes (HCC) 01/21/2021   Hyperlipidemia associated with type 2 diabetes mellitus (HCC) 01/21/2021   Aortic atherosclerosis (HCC) 01/21/2021   Chronic low back pain 10/05/2019   Type 2 diabetes mellitus with hyperglycemia, without long-term current use of insulin  (HCC) 02/21/2019   Hypothyroidism 02/21/2019   Pain of left hip joint 12/08/2018   Dermatochalasis of both eyelids 07/04/2018   Viral labyrinthitis 01/13/2017   Acquired spondylolisthesis 04/30/2014   Degeneration of lumbar intervertebral disc 04/30/2014   Osteopenia 11/22/2013   Labial fusion     PCP: Trula Brim MD  REFERRING PROVIDER: Trula Brim MD   REFERRING DIAG:  Diagnosis  R29.898 (ICD-10-CM) - Other symptoms and signs involving the musculoskeletal system    THERAPY DIAG:  Other abnormalities of gait and mobility  Muscle weakness (generalized)  Rationale for Evaluation and Treatment: Rehabilitation  ONSET DATE: long standing muscle weakness   SUBJECTIVE:   SUBJECTIVE STATEMENT: The patient reports she has right hip and left knee pain at times.  She otherwise reports  she is doing well.  She has had no significant problems today. PERTINENT HISTORY: Anxiety, PVD, HTN, DJD, depression, left foot surgery, left knee OA, hard of hearing, left knee scope; right THA, Osteopenia PAIN:  Patient not having pain at this time  PRECAUTIONS: None  RED FLAGS: None   WEIGHT BEARING RESTRICTIONS: No  FALLS:  Has patient fallen in last 6 months? No  LIVING ENVIRONMENT: Steps inside her house. Fine going up. Has to go slow going down  OCCUPATION:  Retired.   Hobbies: none   PLOF: Independent with household mobility with device  PATIENT GOALS:  to get a little more steady  NEXT MD VISIT:  Nothing scheduled   OBJECTIVE:  Note: Objective measures were completed at Evaluation unless otherwise noted.  DIAGNOSTIC FINDINGS:    PATIENT SURVEYS:  FOTO 51 % 59 expected at 10 visits   COGNITION: Overall cognitive status: Within functional limits for tasks assessed     SENSATION: WFL   POSTURE: No Significant postural limitations  PALPATION: No TTP  LOWER EXTREMITY MMT:  MMT Right eval Left eval  Hip flexion 12.4 13.8  Hip extension    Hip abduction 8.4 8.6  Hip adduction    Hip internal rotation    Hip external rotation    Knee flexion    Knee extension 23.1 16.1  Ankle dorsiflexion    Ankle plantarflexion    Ankle inversion    Ankle eversion     (Blank rows = not tested)   FUNCTIONAL TESTS:  5x sit to stand 18 sec  GAIT: Mild forward flexion on the walker; decreased hip flexion    Balance:   Narrow base SBA  NArrow base eyes closed min a   Tandem min a bialteral   TODAY'S TREATMENT:                                                                                                                              DATE:     1/2: Seated clam GTB x30 Seated marching 2# 2x10 (4 step under feet) Standing hip 3 way 2x10ea Standing HR/TR x20 Standing marching on floor 2x10 Hurdle step overs -one at a time 4 hurdles with CGA  (gait belt) x4  - then step over step without rail- 4hurdles CGA on gait belt -lateral hurdle step overs x4 hurdles x2 laps with CGA and use of rail Romberg stance on airex 2x 30sec Standing marching on airex 2x10 SBA FA on airex with EC x30seconds Sit to stands 2x10 from chair (careful of L knee pain) Gait training no AD, cues for step length and heel contact 227ft with CGA    12/19 Nu-step 4 min   Seated clam GTB (4 step under feet) x30 Seated marching 2# 2x10 (4 step under feet)  Airex: Narrow base eyes closed 2 x 30 seconds Tandem stance 2 x 30 seconds each leg forward Heel toe rock 2 x 15 March 2 x 10 All with contact-guard assist  2 inch step up 2 x 10 each leg Hurdle step lateral 2 x 10 each direction   12/11: Seated clam GTB (4 step under feet) x30 Seated marching 2# 2x10 (4 step under feet) Standing hip 3 way 2x10ea Standing HR/TR x20 Hurdle step overs -one at a time 4 hurdles with CGA (gait belt) x4  - then step over step without rail- 2hurdles CGA on gait belt Romberg stance on airex 2x 30sec Standing marching on airex 2x10 SBA Standing march no UE support x10 CGA Sit to stands 2x10 from chair Gait training no AD, cues for step length and heel contact    Eval: Access Code: [email or web address] URL: [email or web address] Date: 10/28/2023 Prepared by: Alm Don  Exercises - Seated Knee Extension with Resistance  - 1 x daily - 7 x weekly - 3 sets - 10 reps - Seated March with Resistance  - 1 x daily - 7 x weekly - 3 sets - 10 reps - Seated Hip Abduction with Resistance  - 1  x daily - 7 x weekly - 3 sets - 10 reps  PATIENT EDUCATION:  Education details: HEP, symptom management, benefits of exercises, reviewed tests and measures  Person educated: Patient Education method: Explanation, Demonstration, Tactile cues, Verbal cues, and Handouts Education comprehension: verbalized understanding, returned demonstration, verbal cues required, tactile  cues required, and needs further education  HOME EXERCISE PROGRAM: Access Code: [email or web address] URL: [email or web address] Date: 10/28/2023 Prepared by: Alm Don  Exercises - Seated Knee Extension with Resistance  - 1 x daily - 7 x weekly - 3 sets - 10 reps - Seated March with Resistance  - 1 x daily - 7 x weekly - 3 sets - 10 reps - Seated Hip Abduction with Resistance  - 1 x daily - 7 x weekly - 3 sets - 10 reps  ASSESSMENT:  CLINICAL IMPRESSION: Pt able to ambulate from waiting area into treatment area without AD, though she does have FWW with her. Increased distance with gait training in clinic, requiring occasional cues for increased step length. She did fatigue by end of 212ft and reported an increase in low back discomfort. Requires CGA with balance focused tasks. Pt demonstrates apprehension with airex pad exercises, though she performs well with light CGA.  OBJECTIVE IMPAIRMENTS: Abnormal gait, decreased activity tolerance, decreased balance, decreased knowledge of use of DME, difficulty walking, and decreased strength.   ACTIVITY LIMITATIONS: carrying, lifting, standing, squatting, transfers, and locomotion level  PARTICIPATION LIMITATIONS: meal prep, cleaning, laundry, driving, shopping, and yard work  PERSONAL FACTORS: Age, Past/current experiences, and 1-2 comorbidities: right hip replacement, multiple   are also affecting patient's functional outcome.   REHAB POTENTIAL:  good   CLINICAL DECISION MAKING: Evolving/moderate complexity declining general mobility   EVALUATION COMPLEXITY: Moderate   GOALS: Goals reviewed with patient? Yes  SHORT TERM GOALS: Target date: 11/25/2023   Patient wil increase gross bilateral LE strength by 5 lbs  Baseline: Goal status: INITIAL  2.  Patient will reduce 5x sit to stand time by 6 seconds Baseline:  Goal status: INITIAL  3.  Patient will be independent and complaint with base HEP  Baseline:  Goal status:  INITIAL   LONG TERM GOALS: Target date: 12/23/2023    Patient will rpeort decreased fatigue mbaulting in the community  Baseline:  Goal status: INITIAL  2.  Patient will go down steps with self reported improved stability   Baseline:  Goal status: INITIAL  3.  Patient will be comfortable with gym program if she chooses to use her gym membership  Baseline:  Goal status: INITIAL   PLAN:  PT FREQUENCY: 1-2x/week  PT DURATION: 8 weeks  PLAN FOR NEXT SESSION: consider stair training; review seated series she was given. Consider supine series or standing series of exercises. Consider progression out into the gym.      Asberry BRAVO Alleta Avery, PTA 12/08/2023, 4:28 PM

## 2023-12-13 ENCOUNTER — Ambulatory Visit (HOSPITAL_BASED_OUTPATIENT_CLINIC_OR_DEPARTMENT_OTHER): Payer: Medicare Other | Admitting: Family

## 2023-12-13 ENCOUNTER — Telehealth: Payer: Self-pay | Admitting: Neurology

## 2023-12-13 NOTE — Telephone Encounter (Signed)
 [employer]are Berkley Harvey: [id number] exp. 01/27/24 sent to [facility] [phone]

## 2023-12-14 LAB — APOE ALZHEIMER'S RISK

## 2023-12-14 LAB — DEMENTIA PANEL
Homocysteine: 26.3 umol/L — ABNORMAL HIGH (ref 0.0–21.3)
RPR Ser Ql: NONREACTIVE
TSH: 6.88 u[IU]/mL — ABNORMAL HIGH (ref 0.450–4.500)
Vitamin B-12: 504 pg/mL (ref 232–1245)

## 2023-12-15 ENCOUNTER — Encounter (HOSPITAL_BASED_OUTPATIENT_CLINIC_OR_DEPARTMENT_OTHER): Payer: Self-pay | Admitting: Physical Therapy

## 2023-12-15 ENCOUNTER — Ambulatory Visit (HOSPITAL_BASED_OUTPATIENT_CLINIC_OR_DEPARTMENT_OTHER): Payer: Medicare Other | Admitting: Physical Therapy

## 2023-12-15 DIAGNOSIS — R2689 Other abnormalities of gait and mobility: Secondary | ICD-10-CM | POA: Diagnosis not present

## 2023-12-15 DIAGNOSIS — M6281 Muscle weakness (generalized): Secondary | ICD-10-CM | POA: Diagnosis not present

## 2023-12-15 NOTE — Therapy (Signed)
 OUTPATIENT PHYSICAL THERAPY LOWER EXTREMITY TREATMENT   Patient Name: Christina Sellers MRN: [id number] DOB:08-20-1941, 83 y.o., female Today's Date: 12/15/2023  END OF SESSION:  PT End of Session - 12/15/23 1534     Visit Number 5    Number of Visits 16    Date for PT Re-Evaluation 12/23/23    PT Start Time 1300    PT Stop Time 1343    PT Time Calculation (min) 43 min    Activity Tolerance Patient tolerated treatment well    Behavior During Therapy WFL for tasks assessed/performed                Past Medical History:  Diagnosis Date   Abnormal Pap smear of vagina    Allergic sinusitis    Anxiety    Atherosclerotic peripheral vascular disease (HCC)    Carotid artery stenosis    Cellulitis    COPD with asthma (HCC)    not an issue now   Depression    Diabetes mellitus type 2, uncomplicated (HCC)    DJD (degenerative joint disease)    Elevated cholesterol    Elevated MCV    secondary to alchol   GERD (gastroesophageal reflux disease)    not an issue now.   Gout    Hearing difficulty    bilateral hearing aids   History of recurrent UTIs    Hypertension    loss weight'off meds now   Hypothyroidism    IBS (irritable bowel syndrome)    Insomnia    Labial fusion    Osteoarthritis of left knee    Osteopenia    Recurrent vaginitis    Sjogren's syndrome (HCC)    Syncope 11.7.14   secondary to dehydration and possible hypoglycemia   Tendonitis    Thyroid  disease    Transfusion history    age 49 anemia   Past Surgical History:  Procedure Laterality Date   CATARACT EXTRACTION, BILATERAL Bilateral    CHOLECYSTECTOMY     open   COLONOSCOPY WITH PROPOFOL  N/A 03/23/2016   Procedure: COLONOSCOPY WITH PROPOFOL ;  Surgeon: Gladis MARLA Louder, MD;  Location: [facility];  Service: Endoscopy;  Laterality: N/A;   EYE SURGERY     FOOT SURGERY Left    retained hardware   KNEE SURGERY Left    scope    TONSILLECTOMY     TOTAL HIP ARTHROPLASTY Right 03/30/2021    Procedure: RIGHTTOTAL HIP ARTHROPLASTY ANTERIOR APPROACH;  Surgeon: Liam Lerner, MD;  Location: [facility];  Service: Orthopedics;  Laterality: Right;   Patient Active Problem List   Diagnosis Date Noted   Paroxysmal SVT (supraventricular tachycardia) (HCC) 06/30/2023   Bilateral carotid artery stenosis 01/21/2023   Abnormal finding of blood chemistry, unspecified 05/13/2021   Acquired hallux valgus 05/13/2021   Age-related osteoporosis without current pathological fracture 05/13/2021   Allergic rhinitis 05/13/2021   Anxiety disorder 05/13/2021   Apnea 05/13/2021   Avascular necrosis of bone of hip (HCC) 05/13/2021   Benign paroxysmal positional vertigo 05/13/2021   Bilateral tinnitus 05/13/2021   Bronchospasm 05/13/2021   Chronic kidney disease, stage 3 unspecified (HCC) 05/13/2021   Chronic obstructive pulmonary disease, unspecified (HCC) 05/13/2021   Constipation 05/13/2021   Costochondritis 05/13/2021   Disequilibrium 05/13/2021   Dysphagia 05/13/2021   DOE (dyspnea on exertion) 05/13/2021   Elevated blood-pressure reading, without diagnosis of hypertension 05/13/2021   Fatigue 05/13/2021   Gastro-esophageal reflux disease without esophagitis 05/13/2021   Generalized hyperhidrosis 05/13/2021   Gout 05/13/2021   Hair loss  disorder 05/13/2021   Hematochezia 05/13/2021   Hemorrhage of colon due to diverticulosis 05/13/2021   Hypersomnia 05/13/2021   Indigestion 05/13/2021   Labyrinthitis of both ears 05/13/2021   Left flank pain 05/13/2021   Leukocytosis 05/13/2021   Lichen planus 05/13/2021   Localized swelling, mass and lump, neck 05/13/2021   Major depression, single episode 05/13/2021   Memory impairment 05/13/2021   Myalgia 05/13/2021   Night sweats 05/13/2021   Noninflammatory disorder of vagina 05/13/2021   Obstructive sleep apnea syndrome 05/13/2021   Osteoarthritis of knee 05/13/2021   Other long term (current) drug therapy 05/13/2021   Pharyngeal dysphagia  05/13/2021   Sciatica 05/13/2021   Sjogren's syndrome (HCC) 05/13/2021   Sleep disorder 05/13/2021   Status post hip replacement 05/13/2021   Superficial phlebitis 05/13/2021   Tear film insufficiency 05/13/2021   Urinary tract infectious disease 05/13/2021   Urticaria 05/13/2021   Ventral hernia without obstruction or gangrene 05/13/2021   Vitamin D  deficiency 05/13/2021   H/O total hip arthroplasty, right 03/30/2021   Osteoarthritis of right hip 03/25/2021   Orthostatic hypotension 02/27/2021   IBS (irritable bowel syndrome) 02/11/2021   Hyponatremia 02/11/2021   Acute cystitis without hematuria    Chronic back pain    E-coli UTI 02/01/2021   Dehydration with hyponatremia 02/01/2021   Chronic heart failure with preserved ejection fraction (HCC) 02/01/2021   Hypertension associated with diabetes (HCC) 01/21/2021   Hyperlipidemia associated with type 2 diabetes mellitus (HCC) 01/21/2021   Aortic atherosclerosis (HCC) 01/21/2021   Chronic low back pain 10/05/2019   Type 2 diabetes mellitus with hyperglycemia, without long-term current use of insulin  (HCC) 02/21/2019   Hypothyroidism 02/21/2019   Pain of left hip joint 12/08/2018   Dermatochalasis of both eyelids 07/04/2018   Viral labyrinthitis 01/13/2017   Acquired spondylolisthesis 04/30/2014   Degeneration of lumbar intervertebral disc 04/30/2014   Osteopenia 11/22/2013   Labial fusion     PCP: Trula Brim MD  REFERRING PROVIDER: Trula Brim MD   REFERRING DIAG:  Diagnosis  R29.898 (ICD-10-CM) - Other symptoms and signs involving the musculoskeletal system    THERAPY DIAG:  Other abnormalities of gait and mobility  Muscle weakness (generalized)  Rationale for Evaluation and Treatment: Rehabilitation  ONSET DATE: long standing muscle weakness   SUBJECTIVE:   SUBJECTIVE STATEMENT: Reports she is doing well today.  She reports she is just cold.  She reports no significant pain in her knees or back.    PERTINENT  HISTORY: Anxiety, PVD, HTN, DJD, depression, left foot surgery, left knee OA, hard of hearing, left knee scope; right THA, Osteopenia PAIN:  Patient not having pain at this time  PRECAUTIONS: None  RED FLAGS: None   WEIGHT BEARING RESTRICTIONS: No  FALLS:  Has patient fallen in last 6 months? No  LIVING ENVIRONMENT: Steps inside her house. Fine going up. Has to go slow going down  OCCUPATION:  Retired.   Hobbies: none   PLOF: Independent with household mobility with device  PATIENT GOALS:  to get a little more steady  NEXT MD VISIT:  Nothing scheduled   OBJECTIVE:  Note: Objective measures were completed at Evaluation unless otherwise noted.  DIAGNOSTIC FINDINGS:    PATIENT SURVEYS:  FOTO 51 % 59 expected at 10 visits   COGNITION: Overall cognitive status: Within functional limits for tasks assessed     SENSATION: WFL   POSTURE: No Significant postural limitations  PALPATION: No TTP    LOWER EXTREMITY MMT:  MMT Right eval  Left eval  Hip flexion 12.4 13.8  Hip extension    Hip abduction 8.4 8.6  Hip adduction    Hip internal rotation    Hip external rotation    Knee flexion    Knee extension 23.1 16.1  Ankle dorsiflexion    Ankle plantarflexion    Ankle inversion    Ankle eversion     (Blank rows = not tested)   FUNCTIONAL TESTS:  5x sit to stand 18 sec  GAIT: Mild forward flexion on the walker; decreased hip flexion    Balance:   Narrow base SBA  NArrow base eyes closed min a   Tandem min a bialteral   TODAY'S TREATMENT:                                                                                                                              DATE:    1/9  Seated punch 3 x 10 1 pound Seated flexion shoulder 3 x 10 1 pound Biceps curl 3 x 10 2 pounds  Seated clam GTB 3x10 Seated ball squeeze 3 x 10    Standing hip 3 way 2x10ea Standing HR/TR x20 Standing marching on floor 2x10  Air-ex:  Step on with contact-guard  assist x 10 each leg Lateral step arm with contact-guard assist x 10 each leg  Heel raise/toe raise on Airex 2 x 15  Hurdle sidestep 2 x 10 lateral Hurdle step over 3 hurdles 3 laps  1/2: Seated clam GTB x30 Seated marching 2# 2x10 (4 step under feet) Standing hip 3 way 2x10ea Standing HR/TR x20 Standing marching on floor 2x10 Hurdle step overs -one at a time 4 hurdles with CGA (gait belt) x4  - then step over step without rail- 4hurdles CGA on gait belt -lateral hurdle step overs x4 hurdles x2 laps with CGA and use of rail Romberg stance on airex 2x 30sec Standing marching on airex 2x10 SBA FA on airex with EC x30seconds Sit to stands 2x10 from chair (careful of L knee pain) Gait training no AD, cues for step length and heel contact 2101ft with CGA    12/19 Nu-step 4 min   Seated clam GTB (4 step under feet) x30 Seated marching 2# 2x10 (4 step under feet)  Airex: Narrow base eyes closed 2 x 30 seconds Tandem stance 2 x 30 seconds each leg forward Heel toe rock 2 x 15 March 2 x 10 All with contact-guard assist  2 inch step up 2 x 10 each leg Hurdle step lateral 2 x 10 each direction   12/11: Seated clam GTB (4 step under feet) x30 Seated marching 2# 2x10 (4 step under feet) Standing hip 3 way 2x10ea Standing HR/TR x20 Hurdle step overs -one at a time 4 hurdles with CGA (gait belt) x4  - then step over step without rail- 2hurdles CGA on gait belt Romberg stance on airex 2x 30sec Standing marching on airex 2x10 SBA Standing march  no UE support x10 CGA Sit to stands 2x10 from chair Gait training no AD, cues for step length and heel contact    Eval: Access Code: [email or web address] URL: [email or web address] Date: 10/28/2023 Prepared by: Alm Don  Exercises - Seated Knee Extension with Resistance  - 1 x daily - 7 x weekly - 3 sets - 10 reps - Seated March with Resistance  - 1 x daily - 7 x weekly - 3 sets - 10 reps - Seated Hip Abduction with  Resistance  - 1 x daily - 7 x weekly - 3 sets - 10 reps  PATIENT EDUCATION:  Education details: HEP, symptom management, benefits of exercises, reviewed tests and measures  Person educated: Patient Education method: Explanation, Demonstration, Tactile cues, Verbal cues, and Handouts Education comprehension: verbalized understanding, returned demonstration, verbal cues required, tactile cues required, and needs further education  HOME EXERCISE PROGRAM: Access Code: [email or web address] URL: [email or web address] Date: 10/28/2023 Prepared by: Alm Don  Exercises - Seated Knee Extension with Resistance  - 1 x daily - 7 x weekly - 3 sets - 10 reps - Seated March with Resistance  - 1 x daily - 7 x weekly - 3 sets - 10 reps - Seated Hip Abduction with Resistance  - 1 x daily - 7 x weekly - 3 sets - 10 reps  ASSESSMENT:  CLINICAL IMPRESSION: Therapy continues to focus on balance exercises and exercises in which she has to lift her feet up.  She did well with hurdles today.  She required contact-guard with balance exercises.  We also worked on upper extremity series of exercises.  Therapy will continue to progress as tolerated. OBJECTIVE IMPAIRMENTS: Abnormal gait, decreased activity tolerance, decreased balance, decreased knowledge of use of DME, difficulty walking, and decreased strength.   ACTIVITY LIMITATIONS: carrying, lifting, standing, squatting, transfers, and locomotion level  PARTICIPATION LIMITATIONS: meal prep, cleaning, laundry, driving, shopping, and yard work  PERSONAL FACTORS: Age, Past/current experiences, and 1-2 comorbidities: right hip replacement, multiple   are also affecting patient's functional outcome.   REHAB POTENTIAL:  good   CLINICAL DECISION MAKING: Evolving/moderate complexity declining general mobility   EVALUATION COMPLEXITY: Moderate   GOALS: Goals reviewed with patient? Yes  SHORT TERM GOALS: Target date: 11/25/2023   Patient wil increase  gross bilateral LE strength by 5 lbs  Baseline: Goal status: INITIAL  2.  Patient will reduce 5x sit to stand time by 6 seconds Baseline:  Goal status: INITIAL  3.  Patient will be independent and complaint with base HEP  Baseline:  Goal status: INITIAL   LONG TERM GOALS: Target date: 12/23/2023    Patient will rpeort decreased fatigue mbaulting in the community  Baseline:  Goal status: INITIAL  2.  Patient will go down steps with self reported improved stability   Baseline:  Goal status: INITIAL  3.  Patient will be comfortable with gym program if she chooses to use her gym membership  Baseline:  Goal status: INITIAL   PLAN:  PT FREQUENCY: 1-2x/week  PT DURATION: 8 weeks  PLAN FOR NEXT SESSION: consider stair training; review seated series she was given. Consider supine series or standing series of exercises. Consider progression out into the gym.      Alm JINNY Don, PT 12/15/2023, 9:29 PM

## 2023-12-19 ENCOUNTER — Telehealth: Payer: Self-pay | Admitting: *Deleted

## 2023-12-19 NOTE — Telephone Encounter (Signed)
-----   Message from Delia Heady sent at 12/18/2023 10:47 AM EST ----- Kindly inform the patient that blood test for Alzheimer's shows that she is only at average risk and not at high risk

## 2023-12-19 NOTE — Telephone Encounter (Signed)
 Left message for patient daughter [name] to call.

## 2023-12-22 NOTE — Telephone Encounter (Signed)
-----   Message from Delia Heady sent at 12/18/2023 10:47 AM EST ----- Kindly inform the patient that blood test for Alzheimer's shows that she is only at average risk and not at high risk

## 2023-12-22 NOTE — Telephone Encounter (Signed)
LVM for a call back to discuss labwork results

## 2023-12-29 ENCOUNTER — Encounter (HOSPITAL_COMMUNITY): Payer: Medicare Other

## 2023-12-29 ENCOUNTER — Telehealth: Payer: Self-pay | Admitting: Neurology

## 2023-12-29 NOTE — Telephone Encounter (Signed)
Pt's daughter has called to report that before seeking a refill on memantine (NAMENDA TITRATION PAK) tablet pack she wants Dr Pearlean Brownie to be aware that pt's blood pressure has been up and she would like to discuss before refilling the medication.

## 2023-12-29 NOTE — Telephone Encounter (Addendum)
Spoke to Daughter (checked DPR) gave lab work results  Per Dr Pearlean Brownie please continue with Memantine . Keep record of BP and consult PCP about BP . Daughter expressed understanding and thanked me for calling Per Daughter request sent lab results to PCP Daughter aware to call PCP Thyroid is underactive and she needs to see her primary care physician Dr. Margaretann Loveless for replacement.  Also homocystine levels are elevated which can affect her memory I am going to prescribe folic acid 1 mg tablet to be taken daily which will help bring this down

## 2024-01-02 DIAGNOSIS — R7989 Other specified abnormal findings of blood chemistry: Secondary | ICD-10-CM | POA: Diagnosis not present

## 2024-01-02 DIAGNOSIS — R946 Abnormal results of thyroid function studies: Secondary | ICD-10-CM | POA: Diagnosis not present

## 2024-01-02 DIAGNOSIS — R42 Dizziness and giddiness: Secondary | ICD-10-CM | POA: Diagnosis not present

## 2024-01-04 ENCOUNTER — Telehealth: Payer: Self-pay | Admitting: Neurology

## 2024-01-04 NOTE — Telephone Encounter (Signed)
Pt's daughter called wanting to speak to the RN regarding test results. She is not understanding certain parts of the results.

## 2024-01-04 NOTE — Telephone Encounter (Signed)
Called the daughter back. Was able to review her questions and concerns.  She is scheduled next week for the PET amyloid scan. Informed we would call once we have those results. She has requested information in regard to memory puzzles and diet changes that we recommend to help with memory. I will email this information to her at [email or web address]

## 2024-01-09 ENCOUNTER — Ambulatory Visit (HOSPITAL_BASED_OUTPATIENT_CLINIC_OR_DEPARTMENT_OTHER): Payer: Medicare Other | Attending: Internal Medicine

## 2024-01-10 ENCOUNTER — Encounter (HOSPITAL_COMMUNITY)
Admission: RE | Admit: 2024-01-10 | Discharge: 2024-01-10 | Disposition: A | Discharge status: Home / Self Care | Payer: Medicare Other | Source: Ambulatory Visit | Attending: Neurology | Admitting: Neurology

## 2024-01-10 DIAGNOSIS — G309 Alzheimer's disease, unspecified: Secondary | ICD-10-CM | POA: Diagnosis not present

## 2024-01-10 MED ORDER — FLORBETAPIR F 18 500-1900 MBQ/ML IV SOLN
10.1800 | Freq: Once | INTRAVENOUS | Status: AC
  Administered 2024-01-10: 10.18 via INTRAVENOUS

## 2024-01-12 ENCOUNTER — Ambulatory Visit (HOSPITAL_BASED_OUTPATIENT_CLINIC_OR_DEPARTMENT_OTHER): Payer: Medicare Other

## 2024-01-16 ENCOUNTER — Encounter (HOSPITAL_BASED_OUTPATIENT_CLINIC_OR_DEPARTMENT_OTHER): Payer: Self-pay

## 2024-01-16 ENCOUNTER — Ambulatory Visit (HOSPITAL_BASED_OUTPATIENT_CLINIC_OR_DEPARTMENT_OTHER): Payer: Medicare Other | Attending: Internal Medicine

## 2024-01-16 DIAGNOSIS — R2689 Other abnormalities of gait and mobility: Secondary | ICD-10-CM | POA: Insufficient documentation

## 2024-01-16 DIAGNOSIS — M6281 Muscle weakness (generalized): Secondary | ICD-10-CM | POA: Insufficient documentation

## 2024-01-16 NOTE — Therapy (Addendum)
OUTPATIENT PHYSICAL THERAPY LOWER EXTREMITY TREATMENT Progress Note Reporting Period 10/27/2023 to 01/16/2024  See note below for Objective Data and Assessment of Progress/Goals.      Patient Name: Christina Sellers MRN: [id number] DOB:13-Feb-1941, 83 y.o., female Today's Date: 01/16/2024  END OF SESSION:  PT End of Session - 01/16/24 1356     Visit Number 6    Number of Visits 16    Date for PT Re-Evaluation 12/23/23    PT Start Time 1348    PT Stop Time 1432    PT Time Calculation (min) 44 min    Activity Tolerance Patient tolerated treatment well    Behavior During Therapy WFL for tasks assessed/performed                 Past Medical History:  Diagnosis Date   Abnormal Pap smear of vagina    Allergic sinusitis    Anxiety    Atherosclerotic peripheral vascular disease (HCC)    Carotid artery stenosis    Cellulitis    COPD with asthma (HCC)    not an issue now   Depression    Diabetes mellitus type 2, uncomplicated (HCC)    DJD (degenerative joint disease)    Elevated cholesterol    Elevated MCV    secondary to alchol   GERD (gastroesophageal reflux disease)    not an issue now.   Gout    Hearing difficulty    bilateral hearing aids   History of recurrent UTIs    Hypertension    loss weight'off meds now"   Hypothyroidism    IBS (irritable bowel syndrome)    Insomnia    Labial fusion    Osteoarthritis of left knee    Osteopenia    Recurrent vaginitis    Sjogren's syndrome (HCC)    Syncope 11.7.14   secondary to dehydration and possible hypoglycemia   Tendonitis    Thyroid disease    Transfusion history    age 89 "anemia"   Past Surgical History:  Procedure Laterality Date   CATARACT EXTRACTION, BILATERAL Bilateral    CHOLECYSTECTOMY     open   COLONOSCOPY WITH PROPOFOL N/A 03/23/2016   Procedure: COLONOSCOPY WITH PROPOFOL;  Surgeon: Charolett Bumpers, MD;  Location: [facility];  Service: Endoscopy;  Laterality: N/A;   EYE SURGERY     FOOT  SURGERY Left    retained hardware   KNEE SURGERY Left    scope    TONSILLECTOMY     TOTAL HIP ARTHROPLASTY Right 03/30/2021   Procedure: RIGHTTOTAL HIP ARTHROPLASTY ANTERIOR APPROACH;  Surgeon: Gean Birchwood, MD;  Location: [facility];  Service: Orthopedics;  Laterality: Right;   Patient Active Problem List   Diagnosis Date Noted   Paroxysmal SVT (supraventricular tachycardia) (HCC) 06/30/2023   Bilateral carotid artery stenosis 01/21/2023   Abnormal finding of blood chemistry, unspecified 05/13/2021   Acquired hallux valgus 05/13/2021   Age-related osteoporosis without current pathological fracture 05/13/2021   Allergic rhinitis 05/13/2021   Anxiety disorder 05/13/2021   Apnea 05/13/2021   Avascular necrosis of bone of hip (HCC) 05/13/2021   Benign paroxysmal positional vertigo 05/13/2021   Bilateral tinnitus 05/13/2021   Bronchospasm 05/13/2021   Chronic kidney disease, stage 3 unspecified (HCC) 05/13/2021   Chronic obstructive pulmonary disease, unspecified (HCC) 05/13/2021   Constipation 05/13/2021   Costochondritis 05/13/2021   Disequilibrium 05/13/2021   Dysphagia 05/13/2021   DOE (dyspnea on exertion) 05/13/2021   Elevated blood-pressure reading, without diagnosis of hypertension 05/13/2021   Fatigue  05/13/2021   Gastro-esophageal reflux disease without esophagitis 05/13/2021   Generalized hyperhidrosis 05/13/2021   Gout 05/13/2021   Hair loss disorder 05/13/2021   Hematochezia 05/13/2021   Hemorrhage of colon due to diverticulosis 05/13/2021   Hypersomnia 05/13/2021   Indigestion 05/13/2021   Labyrinthitis of both ears 05/13/2021   Left flank pain 05/13/2021   Leukocytosis 05/13/2021   Lichen planus 05/13/2021   Localized swelling, mass and lump, neck 05/13/2021   Major depression, single episode 05/13/2021   Memory impairment 05/13/2021   Myalgia 05/13/2021   Night sweats 05/13/2021   Noninflammatory disorder of vagina 05/13/2021   Obstructive sleep apnea syndrome  05/13/2021   Osteoarthritis of knee 05/13/2021   Other long term (current) drug therapy 05/13/2021   Pharyngeal dysphagia 05/13/2021   Sciatica 05/13/2021   Sjogren's syndrome (HCC) 05/13/2021   Sleep disorder 05/13/2021   Status post hip replacement 05/13/2021   Superficial phlebitis 05/13/2021   Tear film insufficiency 05/13/2021   Urinary tract infectious disease 05/13/2021   Urticaria 05/13/2021   Ventral hernia without obstruction or gangrene 05/13/2021   Vitamin D deficiency 05/13/2021   H/O total hip arthroplasty, right 03/30/2021   Osteoarthritis of right hip 03/25/2021   Orthostatic hypotension 02/27/2021   IBS (irritable bowel syndrome) 02/11/2021   Hyponatremia 02/11/2021   Acute cystitis without hematuria    Chronic back pain    E-coli UTI 02/01/2021   Dehydration with hyponatremia 02/01/2021   Chronic heart failure with preserved ejection fraction (HCC) 02/01/2021   Hypertension associated with diabetes (HCC) 01/21/2021   Hyperlipidemia associated with type 2 diabetes mellitus (HCC) 01/21/2021   Aortic atherosclerosis (HCC) 01/21/2021   Chronic low back pain 10/05/2019   Type 2 diabetes mellitus with hyperglycemia, without long-term current use of insulin (HCC) 02/21/2019   Hypothyroidism 02/21/2019   Pain of left hip joint 12/08/2018   Dermatochalasis of both eyelids 07/04/2018   Viral labyrinthitis 01/13/2017   Acquired spondylolisthesis 04/30/2014   Degeneration of lumbar intervertebral disc 04/30/2014   Osteopenia 11/22/2013   Labial fusion     PCP: Hillard Danker MD  REFERRING PROVIDER: Hillard Danker MD   REFERRING DIAG:  Diagnosis  R29.898 (ICD-10-CM) - Other symptoms and signs involving the musculoskeletal system    THERAPY DIAG:  Other abnormalities of gait and mobility  Muscle weakness (generalized)  Rationale for Evaluation and Treatment: Rehabilitation  ONSET DATE: long standing muscle weakness   SUBJECTIVE:   SUBJECTIVE STATEMENT: Pt  reports she had some dizziness last week and had  to cancel. No dizziness since. Denies recent falls.    PERTINENT HISTORY: Anxiety, PVD, HTN, DJD, depression, left foot surgery, left knee OA, hard of hearing, left knee scope; right THA, Osteopenia PAIN:  Patient not having pain at this time  PRECAUTIONS: None  RED FLAGS: None   WEIGHT BEARING RESTRICTIONS: No  FALLS:  Has patient fallen in last 6 months? No  LIVING ENVIRONMENT: Steps inside her house. Fine going up. Has to go slow going down  OCCUPATION:  Retired.   Hobbies: none   PLOF: Independent with household mobility with device  PATIENT GOALS: " to get a little more steady"  NEXT MD VISIT:  Nothing scheduled   OBJECTIVE:  Note: Objective measures were completed at Evaluation unless otherwise noted.  DIAGNOSTIC FINDINGS:    PATIENT SURVEYS:  FOTO 51 % 59 expected at 10 visits  FOTO:61% visit 6 COGNITION: Overall cognitive status: Within functional limits for tasks assessed     SENSATION: [facility]  POSTURE: No Significant postural limitations  PALPATION: No TTP    LOWER EXTREMITY MMT:  MMT Right eval Left eval Right 2/10 Left  2/10  Hip flexion 12.4 13.8 21.8 32.9  Hip extension      Hip abduction 8.4 8.6 23.3 21.8  Hip adduction      Hip internal rotation      Hip external rotation      Knee flexion      Knee extension 23.1 16.1 24.1 23.5  Ankle dorsiflexion      Ankle plantarflexion      Ankle inversion      Ankle eversion       (Blank rows = not tested)   FUNCTIONAL TESTS:  5x sit to stand 18 sec  5xsit to stand 14.65sec (some unsteadiness and utilized back of legs on plinth to stabilize) GAIT: Mild forward flexion on the walker; decreased hip flexion    Balance:   Narrow base SBA  NArrow base eyes closed min a   Tandem min a bialteral   TODAY'S TREATMENT:                                                                                                                               DATE:     2/10: Goal review FOTO:61% (met goal) 5xSTS Updated MMT Seated clams GTB x30 Standing march 2x20 Heel/toe raises 2x20 Side stepping at rail x3laps  1/9  Seated punch 3 x 10 1 pound Seated flexion shoulder 3 x 10 1 pound Biceps curl 3 x 10 2 pounds  Seated clam GTB 3x10 Seated ball squeeze 3 x 10    Standing hip 3 way 2x10ea Standing HR/TR x20 Standing marching on floor 2x10  Air-ex:  Step on with contact-guard assist x 10 each leg Lateral step arm with contact-guard assist x 10 each leg  Heel raise/toe raise on Airex 2 x 15  Hurdle sidestep 2 x 10 lateral Hurdle step over 3 hurdles 3 laps  1/2: Seated clam GTB x30 Seated marching 2# 2x10 (4" step under feet) Standing hip 3 way 2x10ea Standing HR/TR x20 Standing marching on floor 2x10 Hurdle step overs -one at a time 4 hurdles with CGA (gait belt) x4  - then step over step without rail- 4hurdles CGA on gait belt -lateral hurdle step overs x4 hurdles x2 laps with CGA and use of rail Romberg stance on airex 2x 30sec Standing marching on airex 2x10 SBA FA on airex with EC x30seconds Sit to stands 2x10 from chair (careful of L knee pain) Gait training no AD, cues for step length and heel contact 236ft with CGA    12/19 Nu-step 4 min   Seated clam GTB (4" step under feet) x30 Seated marching 2# 2x10 (4" step under feet)  Airex: Narrow base eyes closed 2 x 30 seconds Tandem stance 2 x 30 seconds each leg forward Heel toe rock 2 x 15 March 2 x 10 All  with contact-guard assist  2 inch step up 2 x 10 each leg Hurdle step lateral 2 x 10 each direction   12/11: Seated clam GTB (4" step under feet) x30 Seated marching 2# 2x10 (4" step under feet) Standing hip 3 way 2x10ea Standing HR/TR x20 Hurdle step overs -one at a time 4 hurdles with CGA (gait belt) x4  - then step over step without rail- 2hurdles CGA on gait belt Romberg stance on airex 2x 30sec Standing marching on airex 2x10  SBA Standing march no UE support x10 CGA Sit to stands 2x10 from chair Gait training no AD, cues for step length and heel contact    Eval: Access Code: [email or web address] URL: [email or web address] Date: 10/28/2023 Prepared by: Lorayne Bender  Exercises - Seated Knee Extension with Resistance  - 1 x daily - 7 x weekly - 3 sets - 10 reps - Seated March with Resistance  - 1 x daily - 7 x weekly - 3 sets - 10 reps - Seated Hip Abduction with Resistance  - 1 x daily - 7 x weekly - 3 sets - 10 reps  PATIENT EDUCATION:  Education details: HEP, symptom management, benefits of exercises, reviewed tests and measures  Person educated: Patient Education method: Explanation, Demonstration, Tactile cues, Verbal cues, and Handouts Education comprehension: verbalized understanding, returned demonstration, verbal cues required, tactile cues required, and needs further education  HOME EXERCISE PROGRAM: Access Code: [email or web address] URL: [email or web address] Date: 10/28/2023 Prepared by: Lorayne Bender  Exercises - Seated Knee Extension with Resistance  - 1 x daily - 7 x weekly - 3 sets - 10 reps - Seated March with Resistance  - 1 x daily - 7 x weekly - 3 sets - 10 reps - Seated Hip Abduction with Resistance  - 1 x daily - 7 x weekly - 3 sets - 10 reps  ASSESSMENT:  CLINICAL IMPRESSION: Pt has attended 6 visits of PT thus far and is making steady progress towards goals. Strength has improved significantly from IE. Improvement also noted with 5xSTS, though some compensatory tactics observed. Pt does continue to rely on FWW outside of home for ambulation.  Due to her progress thus far, pt will benefit from 3 additional weeks of PT to solidify HEP and hopefully transition to personal training program.      OBJECTIVE IMPAIRMENTS: Abnormal gait, decreased activity tolerance, decreased balance, decreased knowledge of use of DME, difficulty walking, and decreased strength.   ACTIVITY  LIMITATIONS: carrying, lifting, standing, squatting, transfers, and locomotion level  PARTICIPATION LIMITATIONS: meal prep, cleaning, laundry, driving, shopping, and yard work  PERSONAL FACTORS: Age, Past/current experiences, and 1-2 comorbidities: right hip replacement, multiple   are also affecting patient's functional outcome.   REHAB POTENTIAL:  good   CLINICAL DECISION MAKING: Evolving/moderate complexity declining general mobility   EVALUATION COMPLEXITY: Moderate   GOALS: Goals reviewed with patient? Yes  SHORT TERM GOALS: Target date: 11/25/2023   Patient wil increase gross bilateral LE strength by 5 lbs  Baseline: Goal status: MET 2/10  2.  Patient will reduce 5x sit to stand time by 6 seconds Baseline:  Goal status: INITIAL  3.  Patient will be independent and complaint with base HEP  Baseline:  Goal status: MET 2/10   LONG TERM GOALS: Target date: 12/23/2023    Patient will rpeort decreased fatigue mbaulting in the community  Baseline:  Goal status: IN PROGRESS (has not walked enough to tell 2/10)  2.  Patient will go down steps with self reported improved  stability   Baseline:  Goal status: IN PROGRESS (limited by L knee, otherwise I'm fine)  3.  Patient will be comfortable with gym program if she chooses to use her gym membership  Baseline:  Goal status: NOT MET (discussed gym membership and personal training with pt 2/10)   PLAN:  PT FREQUENCY: 1-2x/week  PT DURATION: 3 weeks  PLAN FOR NEXT SESSION: consider stair training; review seated series she was given. Consider supine series or standing series of exercises. Consider progression out into the gym.   Therapist reviewed progress note and agrees with POC and progression of activity   Lorayne Bender PT DPT   Lenore Manner, PTA 01/16/2024, 3:13 PM

## 2024-01-17 ENCOUNTER — Ambulatory Visit (HOSPITAL_COMMUNITY): Payer: Medicare Other | Attending: Internal Medicine

## 2024-01-17 DIAGNOSIS — I361 Nonrheumatic tricuspid (valve) insufficiency: Secondary | ICD-10-CM

## 2024-01-17 DIAGNOSIS — R011 Cardiac murmur, unspecified: Secondary | ICD-10-CM | POA: Diagnosis not present

## 2024-01-17 LAB — ECHOCARDIOGRAM COMPLETE
Area-P 1/2: 2.68 cm2
S' Lateral: 2.4 cm

## 2024-01-19 ENCOUNTER — Encounter (HOSPITAL_BASED_OUTPATIENT_CLINIC_OR_DEPARTMENT_OTHER): Payer: Self-pay | Admitting: Family

## 2024-01-19 ENCOUNTER — Ambulatory Visit (HOSPITAL_BASED_OUTPATIENT_CLINIC_OR_DEPARTMENT_OTHER): Payer: Medicare Other | Admitting: Physical Therapy

## 2024-01-19 ENCOUNTER — Ambulatory Visit (HOSPITAL_BASED_OUTPATIENT_CLINIC_OR_DEPARTMENT_OTHER): Payer: Medicare Other | Admitting: Family

## 2024-01-19 ENCOUNTER — Encounter (HOSPITAL_BASED_OUTPATIENT_CLINIC_OR_DEPARTMENT_OTHER): Payer: Self-pay | Admitting: Physical Therapy

## 2024-01-19 VITALS — BP 158/65 | HR 63 | Ht 60.0 in | Wt 111.0 lb

## 2024-01-19 DIAGNOSIS — I700 Atherosclerosis of aorta: Secondary | ICD-10-CM

## 2024-01-19 DIAGNOSIS — M6281 Muscle weakness (generalized): Secondary | ICD-10-CM

## 2024-01-19 DIAGNOSIS — I152 Hypertension secondary to endocrine disorders: Secondary | ICD-10-CM | POA: Diagnosis not present

## 2024-01-19 DIAGNOSIS — I5032 Chronic diastolic (congestive) heart failure: Secondary | ICD-10-CM

## 2024-01-19 DIAGNOSIS — I361 Nonrheumatic tricuspid (valve) insufficiency: Secondary | ICD-10-CM | POA: Diagnosis not present

## 2024-01-19 DIAGNOSIS — R2689 Other abnormalities of gait and mobility: Secondary | ICD-10-CM | POA: Diagnosis not present

## 2024-01-19 DIAGNOSIS — E1159 Type 2 diabetes mellitus with other circulatory complications: Secondary | ICD-10-CM | POA: Diagnosis not present

## 2024-01-19 NOTE — Progress Notes (Signed)
Advanced Hypertension Clinic Assessment:    Date:  01/19/2024   ID:  Christina Sellers, DOB [birth year], MRN [id number]  PCP:  Thana Ates, MD  Cardiologist:  [name], MD  Nephrologist:  Referring MD: Thana Ates, MD   CC: Hypertension  History of Present Illness:    Christina Sellers is a 84 y.o. female with a hx of hypertension, HFpEF, diabetes, aortic atherosclerosis, PAC, SVT carotid stenosis, CKD, HLD here to follow up in the [facility] Hypertension [facility].   Prior cardiac testing includes: Sleep study approx 2020 with no sleep apnea. Renal duplex 03/2020 with no significant RAS. Echocardiogram 5//23 LVEF 60 to 65%, gr1DD, RV normal, LA moderately dilated. Carotid duplex 01/2023 right ICA 40-59% stenosed, left ICA 1 to 39% stenosis. CT abdomen 03/2023 with normal adrenal gland. Monitor 05/2023 predominantly normal sinus rhythm average heart rate 69 bpm, 1 4 beat run of NSVT and one 11 beat run of slow SVT with artifact.  Triggered and diary events associated with NSR. Of noite, adrenal insufficiency previously ruled out with normal ACTH.   Seen by Dr. Raynelle Jan 06/30/2023 recommended to establish with Hypertension Clinic for consideration of research trial vs RDN due to multiple prior intolerances and poor control of BP causing diastolic heart failure exacerbations.   Initial [facility] visit 07/15/23. Clonidine changed from 0.2mg  QHS to 0.1mg  patch due to concern for rebound hypertension. She had been hesitant regarding medications given prior hyoptension leading to falls. Given information on renal denervation, but was hesitant regarding procedures. Due to snoring, daytime somnolence, sleep study ordered but not yet completed. She was given 30 day course of Lexapro 5mg  as had previously tolerated due to anxiety and encouraged to follow up with PCP.   At visit 09/22/2023 she was no longer using clonidine patch, unclear why or when discontinue, however BP  at home routinely 130s over 60s.  She was recommended to continue current regimen p.o. clonidine 0.1 mg twice daily, Lasix 20 mg daily, spironolactone 25 mg daily, amlodipine 5 mg daily.  She was referred to PREP exercise program. She saw Dr. Raynelle Jan 10/14/2023 with BP 122/50.  Echo for monitoring of TR performed 01/17/2024 normal LVEF 60 to 65%, no RWMA, grade 1 diastolic dysfunction, RV normal, mildly elevated PASP, moderate TR.  Since last clinic visit she has been seen by Dr. Pearlean Brownie of neurology due to progressive memory and cognitive impairment likely due to Alzheimer's dementia and started on Namenda.  Presents today for follow up wit her son. Her daughter is present via phone. Feeling overall well. Reports home BP readings are at goal <140/90, daughter plans to enroll in MyChart and send readings. Does note caregivers have reported lightheadedness.  We discussed this could be related to relative bradycardia and will plan to stop Clonidine. Reports no edema, chest pain, dyspnea. Reviewed echocardiogram.   Previous antihypertensives: HCTZ-near-syncope CCB-near-syncope (high-dose) Hydralazine-leg swelling and bruising Losartan-hydrochlorothiazide - night sweats/fatigue Valsartan - unclear why stopped, last documented 2012  Past Medical History:  Diagnosis Date   Abnormal Pap smear of vagina    Allergic sinusitis    Anxiety    Atherosclerotic peripheral vascular disease (HCC)    Carotid artery stenosis    Cellulitis    COPD with asthma (HCC)    not an issue now   Depression    Diabetes mellitus type 2, uncomplicated (HCC)    DJD (degenerative joint disease)    Elevated cholesterol    Elevated MCV    secondary to  alchol   GERD (gastroesophageal reflux disease)    not an issue now.   Gout    Hearing difficulty    bilateral hearing aids   History of recurrent UTIs    Hypertension    loss weight'off meds now"   Hypothyroidism    IBS (irritable bowel syndrome)    Insomnia     Labial fusion    Osteoarthritis of left knee    Osteopenia    Recurrent vaginitis    Sjogren's syndrome (HCC)    Syncope 11.7.14   secondary to dehydration and possible hypoglycemia   Tendonitis    Thyroid disease    Transfusion history    age 67 "anemia"    Past Surgical History:  Procedure Laterality Date   CATARACT EXTRACTION, BILATERAL Bilateral    CHOLECYSTECTOMY     open   COLONOSCOPY WITH PROPOFOL N/A 03/23/2016   Procedure: COLONOSCOPY WITH PROPOFOL;  Surgeon: Charolett Bumpers, MD;  Location: [facility];  Service: Endoscopy;  Laterality: N/A;   EYE SURGERY     FOOT SURGERY Left    retained hardware   KNEE SURGERY Left    scope    TONSILLECTOMY     TOTAL HIP ARTHROPLASTY Right 03/30/2021   Procedure: RIGHTTOTAL HIP ARTHROPLASTY ANTERIOR APPROACH;  Surgeon: Gean Birchwood, MD;  Location: [facility];  Service: Orthopedics;  Laterality: Right;    Current Medications: Current Meds  Medication Sig   acetaminophen (TYLENOL) 650 MG CR tablet 2 tablets as needed for pain.   amLODipine (NORVASC) 5 MG tablet Take 5 mg by mouth daily.   ascorbic acid (VITAMIN C) 500 MG tablet Take 500 mg by mouth daily.   cetirizine (ZYRTEC ALLERGY) 10 MG tablet as needed for allergies.   Cholecalciferol (VITAMIN D3) 50 MCG (2000 UT) capsule Take 2,000 mg by mouth daily.   clobetasol ointment (TEMOVATE) 0.05 % Apply topically 2 (two) times a week.   denosumab (PROLIA) 60 MG/ML SOSY injection Inject 60 mg into the skin every 6 (six) months.   diclofenac sodium (VOLTAREN) 1 % GEL Apply 1 application topically 2 (two) times daily as needed (to painful sites).   empagliflozin (JARDIANCE) 10 MG TABS tablet Take by mouth daily.   escitalopram (LEXAPRO) 5 MG tablet Take 5 mg by mouth daily.   fluticasone (FLONASE) 50 MCG/ACT nasal spray Place 1 spray into both nostrils daily as needed (for seasonal allergies).   folic acid (FOLVITE) 1 MG tablet Take 1 tablet (1 mg total) by mouth daily.   furosemide  (LASIX) 20 MG tablet Take 1 tablet (20 mg total) by mouth daily.   levothyroxine (SYNTHROID) 75 MCG tablet Take 75 mcg by mouth every morning.   meloxicam (MOBIC) 7.5 MG tablet Take by mouth daily as needed.   memantine (NAMENDA) 10 MG tablet Take 1 tablet (10 mg total) by mouth 2 (two) times daily.   Polyvinyl Alcohol-Povidone PF (REFRESH) 1.4-0.6 % SOLN as needed.   RESTASIS 0.05 % ophthalmic emulsion Place 1 drop into both eyes 2 (two) times daily.   rosuvastatin (CRESTOR) 5 MG tablet Take 5 mg by mouth daily.   spironolactone (ALDACTONE) 25 MG tablet Take 1 tablet (25 mg total) by mouth daily.   sucralfate (CARAFATE) 1 g tablet Take 1 g by mouth 2 (two) times daily.   vitamin E 200 UNIT capsule Take 200 Units by mouth daily.   [DISCONTINUED] cloNIDine (CATAPRES) 0.1 MG tablet Take 0.1 mg by mouth 2 (two) times daily.     Allergies:  Tape, Celexa [citalopram hydrobromide], Fluconazole, Green tea (camellia sinensis), Green tea leaf ext, Hydralazine hcl, Kenalog [triamcinolone], Levaquin [levofloxacin], Losartan potassium-hctz, Magnesium citrate, Methocarbamol, Nitrofurantoin macrocrystal, Scopolamine, Sulfa antibiotics, Tea, Tramadol, and Trimethoprim   Social History   Socioeconomic History   Marital status: Widowed    Spouse name: Not on file   Number of children: Not on file   Years of education: Not on file   Highest education level: Not on file  Occupational History   Not on file  Tobacco Use   Smoking status: Former    Current packs/day: 0.00    Types: Cigarettes    Quit date: 02/21/1982    Years since quitting: 41.9   Smokeless tobacco: Never  Vaping Use   Vaping status: Never Used  Substance and Sexual Activity   Alcohol use: No   Drug use: No   Sexual activity: Never    Birth control/protection: Post-menopausal  Other Topics Concern   Not on file  Social History Narrative   Not on file   Social Drivers of Health   Financial Resource Strain: Low Risk   (07/15/2023)   Overall Financial Resource Strain (CARDIA)    Difficulty of Paying Living Expenses: Not hard at all  Food Insecurity: No Food Insecurity (07/15/2023)   Hunger Vital Sign    Worried About Running Out of Food in the Last Year: Never true    Ran Out of Food in the Last Year: Never true  Transportation Needs: No Transportation Needs (07/15/2023)   PRAPARE - Administrator, Civil Service (Medical): No    Lack of Transportation (Non-Medical): No  Physical Activity: Inactive (07/15/2023)   Exercise Vital Sign    Days of Exercise per Week: 0 days    Minutes of Exercise per Session: 0 min  Stress: Not on file  Social Connections: Not on file     Family History: The patient's family history includes Breast cancer in her sister; Cancer in her brother and father; Diabetes in her maternal aunt.  ROS:   Please see the history of present illness.     All other systems reviewed and are negative.  EKGs/Labs/Other Studies Reviewed:         Recent Labs: 04/25/2023: Magnesium 2.5 05/01/2023: ALT 18; Hemoglobin 12.3; Platelets 150 06/30/2023: NT-Pro BNP 1,397 07/15/2023: BNP 200.9; BUN 23; Creatinine, Ser 1.29; Potassium 4.0; Sodium 137 11/29/2023: TSH 6.880   Recent Lipid Panel    Component Value Date/Time   CHOL 149 08/26/2011 0400   TRIG 195 (H) 08/26/2011 0400   HDL 37 (L) 08/26/2011 0400   CHOLHDL 4.0 08/26/2011 0400   VLDL 39 08/26/2011 0400   LDLCALC 73 08/26/2011 0400   LDLDIRECT 65 07/15/2023 1321    Physical Exam:   VS:  BP (!) 158/65   Pulse 63   Ht 5' (1.524 m)   Wt 111 lb (50.3 kg)   SpO2 97%   BMI 21.68 kg/m  , BMI Body mass index is 21.68 kg/m. GENERAL:  Well appearing HEENT: Pupils equal round and reactive, fundi not visualized, oral mucosa unremarkable NECK:  No jugular venous distention, waveform within normal limits, carotid upstroke brisk and symmetric, no bruits, no thyromegaly LYMPHATICS:  No cervical adenopathy LUNGS:  Clear to auscultation  bilaterally HEART:  RRR.  PMI not displaced or sustained,S1 and S2 within normal limits, no S3, no S4, no clicks, no rubs, no murmurs ABD:  Flat, positive bowel sounds normal in frequency in pitch, no bruits, no rebound,  no guarding, no midline pulsatile mass, no hepatomegaly, no splenomegaly EXT:  2 plus pulses throughout, no edema, no cyanosis no clubbing SKIN:  No rashes no nodules NEURO:  Cranial nerves II through XII grossly intact, motor grossly intact throughout PSYCH:  Cognitively intact, oriented to person place and time   ASSESSMENT/PLAN:    HTN - Known element of whitecoat hypertension.  Continue amlodipine 5 mg daily, Lasix 20 mg daily, spironolactone 25 mg daily.  We will stop clonidine 0.1 mg twice daily due to bradycardia and concern this is contributing to her lightheadedness.  She will monitor BP at home and we will call her daughter in 1 week to check in.  If BP routinely >140/80 at home, will consider increasing Amlodipine dose vs addition of ARB. However, to avoid polypharmacy as her volume status is stable adjustment of her current regimen may be preferred. Her prior intolerance to Losartan/Valsartan is unclear. Could consider Entresto in future if needed for dual HTN/HFpEF benefit.  Previously declined RDN (procedure on hold at this time). Not candidate for Lourena Simmonds trial as she is on Spironolactone. Sleep study ordered previously but not completed. As BP reasonably controlled at home, would defer further testing. With her Alzheimer's I anticipate CPAP would be difficult.  HFpEF / TR - Follows with Dr. Izora Ribas. GDMT Jardiance 10 mg daily, furosemide 20 mg daily, spironolactone 25 mg daily.  Defer beta-blocker due to baseline bradycardia.  Euvolemic and well compensated on exam. Reviewed recent echocardiogram with normal LVEF, moderate TR. Low sodium diet, fluid restriction <2L, and daily weights encouraged.  Educated to contact our office for weight gain of 2 lbs overnight  or 5 lbs in one week.   Anxiety - Continue to follow with PCP. Does note improvement on Lexapro 5mg  daily.   Aortic atherosclerosis / Hx of TIA / HLD, LDL goal <70 -  Continue Rosuvastatin 5mg  daily.   Screening for Secondary Hypertension:     07/15/2023   10:55 AM  Causes  Renovascular HTN Screened     - Comments 2021 renal duplex no stenosis  Sleep Apnea Screened     - Comments sleep study approx 2020 no OSA  Thyroid Disease Screened     - Comments 12/2022 normal TSH  Hyperaldosteronism Not Screened     - Comments low suspicion benefit in testing as already on Spironolactone for HFpEF  Pheochromocytoma Screened     - Comments 01/2023 CT abd with normal adrenal gland    Relevant Labs/Studies:    Latest Ref Rng & Units 07/15/2023    1:21 PM 06/30/2023   11:42 AM 05/01/2023    4:42 AM  Basic Labs  Sodium 134 - 144 mmol/L 137  136  136   Potassium 3.5 - 5.2 mmol/L 4.0  3.2  3.2   Creatinine 0.57 - 1.00 mg/dL 1.61  0.96  0.45        Latest Ref Rng & Units 11/29/2023   12:09 PM 02/11/2021    2:47 PM  Thyroid   TSH 0.450 - 4.500 uIU/mL 6.880  1.151                    Disposition:    FU with Dr. Raynelle Jan in May and with advanced Hypertension Clinic in 6 months     Medication Adjustments/Labs and Tests Ordered: Current medicines are reviewed at length with the patient today.  Concerns regarding medicines are outlined above.  No orders of the defined types were placed in this encounter.  No orders of the defined types were placed in this encounter.    Signed, Alver Sorrow, NP  01/19/2024 4:22 PM    [facility]

## 2024-01-19 NOTE — Therapy (Signed)
OUTPATIENT PHYSICAL THERAPY LOWER EXTREMITY TREATMENT Progress Note Reporting Period 10/27/2023 to 01/16/2024  See note below for Objective Data and Assessment of Progress/Goals.      Patient Name: Christina Sellers MRN: [id number] DOB:[birth year], 83 y.o., female Today's Date: 01/19/2024  END OF SESSION:  PT End of Session - 01/19/24 1240     Visit Number 7    Number of Visits 16    Date for PT Re-Evaluation 12/23/23    PT Start Time 1235    PT Stop Time 1315    PT Time Calculation (min) 40 min    Activity Tolerance Patient tolerated treatment well    Behavior During Therapy WFL for tasks assessed/performed                 Past Medical History:  Diagnosis Date   Abnormal Pap smear of vagina    Allergic sinusitis    Anxiety    Atherosclerotic peripheral vascular disease (HCC)    Carotid artery stenosis    Cellulitis    COPD with asthma (HCC)    not an issue now   Depression    Diabetes mellitus type 2, uncomplicated (HCC)    DJD (degenerative joint disease)    Elevated cholesterol    Elevated MCV    secondary to alchol   GERD (gastroesophageal reflux disease)    not an issue now.   Gout    Hearing difficulty    bilateral hearing aids   History of recurrent UTIs    Hypertension    loss weight'off meds now"   Hypothyroidism    IBS (irritable bowel syndrome)    Insomnia    Labial fusion    Osteoarthritis of left knee    Osteopenia    Recurrent vaginitis    Sjogren's syndrome (HCC)    Syncope 11.7.14   secondary to dehydration and possible hypoglycemia   Tendonitis    Thyroid disease    Transfusion history    age 3 "anemia"   Past Surgical History:  Procedure Laterality Date   CATARACT EXTRACTION, BILATERAL Bilateral    CHOLECYSTECTOMY     open   COLONOSCOPY WITH PROPOFOL N/A 03/23/2016   Procedure: COLONOSCOPY WITH PROPOFOL;  Surgeon: Charolett Bumpers, MD;  Location: [facility];  Service: Endoscopy;  Laterality: N/A;   EYE SURGERY     FOOT  SURGERY Left    retained hardware   KNEE SURGERY Left    scope    TONSILLECTOMY     TOTAL HIP ARTHROPLASTY Right 03/30/2021   Procedure: RIGHTTOTAL HIP ARTHROPLASTY ANTERIOR APPROACH;  Surgeon: Gean Birchwood, MD;  Location: [facility];  Service: Orthopedics;  Laterality: Right;   Patient Active Problem List   Diagnosis Date Noted   Paroxysmal SVT (supraventricular tachycardia) (HCC) 06/30/2023   Bilateral carotid artery stenosis 01/21/2023   Abnormal finding of blood chemistry, unspecified 05/13/2021   Acquired hallux valgus 05/13/2021   Age-related osteoporosis without current pathological fracture 05/13/2021   Allergic rhinitis 05/13/2021   Anxiety disorder 05/13/2021   Apnea 05/13/2021   Avascular necrosis of bone of hip (HCC) 05/13/2021   Benign paroxysmal positional vertigo 05/13/2021   Bilateral tinnitus 05/13/2021   Bronchospasm 05/13/2021   Chronic kidney disease, stage 3 unspecified (HCC) 05/13/2021   Chronic obstructive pulmonary disease, unspecified (HCC) 05/13/2021   Constipation 05/13/2021   Costochondritis 05/13/2021   Disequilibrium 05/13/2021   Dysphagia 05/13/2021   DOE (dyspnea on exertion) 05/13/2021   Elevated blood-pressure reading, without diagnosis of hypertension 05/13/2021   Fatigue  05/13/2021   Gastro-esophageal reflux disease without esophagitis 05/13/2021   Generalized hyperhidrosis 05/13/2021   Gout 05/13/2021   Hair loss disorder 05/13/2021   Hematochezia 05/13/2021   Hemorrhage of colon due to diverticulosis 05/13/2021   Hypersomnia 05/13/2021   Indigestion 05/13/2021   Labyrinthitis of both ears 05/13/2021   Left flank pain 05/13/2021   Leukocytosis 05/13/2021   Lichen planus 05/13/2021   Localized swelling, mass and lump, neck 05/13/2021   Major depression, single episode 05/13/2021   Memory impairment 05/13/2021   Myalgia 05/13/2021   Night sweats 05/13/2021   Noninflammatory disorder of vagina 05/13/2021   Obstructive sleep apnea syndrome  05/13/2021   Osteoarthritis of knee 05/13/2021   Other long term (current) drug therapy 05/13/2021   Pharyngeal dysphagia 05/13/2021   Sciatica 05/13/2021   Sjogren's syndrome (HCC) 05/13/2021   Sleep disorder 05/13/2021   Status post hip replacement 05/13/2021   Superficial phlebitis 05/13/2021   Tear film insufficiency 05/13/2021   Urinary tract infectious disease 05/13/2021   Urticaria 05/13/2021   Ventral hernia without obstruction or gangrene 05/13/2021   Vitamin D deficiency 05/13/2021   H/O total hip arthroplasty, right 03/30/2021   Osteoarthritis of right hip 03/25/2021   Orthostatic hypotension 02/27/2021   IBS (irritable bowel syndrome) 02/11/2021   Hyponatremia 02/11/2021   Acute cystitis without hematuria    Chronic back pain    E-coli UTI 02/01/2021   Dehydration with hyponatremia 02/01/2021   Chronic heart failure with preserved ejection fraction (HCC) 02/01/2021   Hypertension associated with diabetes (HCC) 01/21/2021   Hyperlipidemia associated with type 2 diabetes mellitus (HCC) 01/21/2021   Aortic atherosclerosis (HCC) 01/21/2021   Chronic low back pain 10/05/2019   Type 2 diabetes mellitus with hyperglycemia, without long-term current use of insulin (HCC) 02/21/2019   Hypothyroidism 02/21/2019   Pain of left hip joint 12/08/2018   Dermatochalasis of both eyelids 07/04/2018   Viral labyrinthitis 01/13/2017   Acquired spondylolisthesis 04/30/2014   Degeneration of lumbar intervertebral disc 04/30/2014   Osteopenia 11/22/2013   Labial fusion     PCP: Hillard Danker MD  REFERRING PROVIDER: Hillard Danker MD   REFERRING DIAG:  Diagnosis  R29.898 (ICD-10-CM) - Other symptoms and signs involving the musculoskeletal system    THERAPY DIAG:  No diagnosis found.  Rationale for Evaluation and Treatment: Rehabilitation  ONSET DATE: long standing muscle weakness   SUBJECTIVE:   SUBJECTIVE STATEMENT: Pt reports she had some dizziness last week and had  to  cancel. No dizziness since. Denies recent falls.    PERTINENT HISTORY: Anxiety, PVD, HTN, DJD, depression, left foot surgery, left knee OA, hard of hearing, left knee scope; right THA, Osteopenia PAIN:  Patient not having pain at this time  PRECAUTIONS: None  RED FLAGS: None   WEIGHT BEARING RESTRICTIONS: No  FALLS:  Has patient fallen in last 6 months? No  LIVING ENVIRONMENT: Steps inside her house. Fine going up. Has to go slow going down  OCCUPATION:  Retired.   Hobbies: none   PLOF: Independent with household mobility with device  PATIENT GOALS: " to get a little more steady"  NEXT MD VISIT:  Nothing scheduled   OBJECTIVE:  Note: Objective measures were completed at Evaluation unless otherwise noted.  DIAGNOSTIC FINDINGS:    PATIENT SURVEYS:  FOTO 51 % 59 expected at 10 visits  FOTO:61% visit 6 COGNITION: Overall cognitive status: Within functional limits for tasks assessed     SENSATION: WFL   POSTURE: No Significant postural limitations  PALPATION:  No TTP    LOWER EXTREMITY MMT:  MMT Right eval Left eval Right 2/10 Left  2/10  Hip flexion 12.4 13.8 21.8 32.9  Hip extension      Hip abduction 8.4 8.6 23.3 21.8  Hip adduction      Hip internal rotation      Hip external rotation      Knee flexion      Knee extension 23.1 16.1 24.1 23.5  Ankle dorsiflexion      Ankle plantarflexion      Ankle inversion      Ankle eversion       (Blank rows = not tested)   FUNCTIONAL TESTS:  5x sit to stand 18 sec  5xsit to stand 14.65sec (some unsteadiness and utilized back of legs on plinth to stabilize) GAIT: Mild forward flexion on the walker; decreased hip flexion    Balance:   Narrow base SBA  NArrow base eyes closed min a   Tandem min a bialteral   TODAY'S TREATMENT:                                                                                                                              DATE:   2/13 Manual: All PROM performed with  distraction to reduce pain and improve movement  There-ex:  Seated march grene band 2x15  Seated hip adcution 3x15 green band   There-Act Sit to stand 5x 3 sets    Self Care  Nuro-Re-ed  Air:ex:  Narrow base eyes open and closed 2x30 seconds each  Step on and off air-ex x20 fwd and lateral each leg Heel/toe raise 2x20  March on air-ex 2x15 each leg   2/10: Goal review FOTO:61% (met goal) 5xSTS Updated MMT Seated clams GTB x30 Standing march 2x20 Heel/toe raises 2x20 Side stepping at rail x3laps  1/9  Seated punch 3 x 10 1 pound Seated flexion shoulder 3 x 10 1 pound Biceps curl 3 x 10 2 pounds  Seated clam GTB 3x10 Seated ball squeeze 3 x 10    Standing hip 3 way 2x10ea Standing HR/TR x20 Standing marching on floor 2x10  Air-ex:  Step on with contact-guard assist x 10 each leg Lateral step arm with contact-guard assist x 10 each leg  Heel raise/toe raise on Airex 2 x 15  Hurdle sidestep 2 x 10 lateral Hurdle step over 3 hurdles 3 laps  1/2: Seated clam GTB x30 Seated marching 2# 2x10 (4" step under feet) Standing hip 3 way 2x10ea Standing HR/TR x20 Standing marching on floor 2x10 Hurdle step overs -one at a time 4 hurdles with CGA (gait belt) x4  - then step over step without rail- 4hurdles CGA on gait belt -lateral hurdle step overs x4 hurdles x2 laps with CGA and use of rail Romberg stance on airex 2x 30sec Standing marching on airex 2x10 SBA FA on airex with EC x30seconds Sit to stands 2x10 from chair (careful of L  knee pain) Gait training no AD, cues for step length and heel contact 244ft with CGA    12/19 Nu-step 4 min   Seated clam GTB (4" step under feet) x30 Seated marching 2# 2x10 (4" step under feet)  Airex: Narrow base eyes closed 2 x 30 seconds Tandem stance 2 x 30 seconds each leg forward Heel toe rock 2 x 15 March 2 x 10 All with contact-guard assist  2 inch step up 2 x 10 each leg Hurdle step lateral 2 x 10 each  direction   12/11: Seated clam GTB (4" step under feet) x30 Seated marching 2# 2x10 (4" step under feet) Standing hip 3 way 2x10ea Standing HR/TR x20 Hurdle step overs -one at a time 4 hurdles with CGA (gait belt) x4  - then step over step without rail- 2hurdles CGA on gait belt Romberg stance on airex 2x 30sec Standing marching on airex 2x10 SBA Standing march no UE support x10 CGA Sit to stands 2x10 from chair Gait training no AD, cues for step length and heel contact    Eval: Access Code: [email or web address] URL: [email or web address] Date: 10/28/2023 Prepared by: Lorayne Bender  Exercises - Seated Knee Extension with Resistance  - 1 x daily - 7 x weekly - 3 sets - 10 reps - Seated March with Resistance  - 1 x daily - 7 x weekly - 3 sets - 10 reps - Seated Hip Abduction with Resistance  - 1 x daily - 7 x weekly - 3 sets - 10 reps  PATIENT EDUCATION:  Education details: HEP, symptom management, benefits of exercises, reviewed tests and measures  Person educated: Patient Education method: Explanation, Demonstration, Tactile cues, Verbal cues, and Handouts Education comprehension: verbalized understanding, returned demonstration, verbal cues required, tactile cues required, and needs further education  HOME EXERCISE PROGRAM: Access Code: [email or web address] URL: [email or web address] Date: 10/28/2023 Prepared by: Lorayne Bender  Exercises - Seated Knee Extension with Resistance  - 1 x daily - 7 x weekly - 3 sets - 10 reps - Seated March with Resistance  - 1 x daily - 7 x weekly - 3 sets - 10 reps - Seated Hip Abduction with Resistance  - 1 x daily - 7 x weekly - 3 sets - 10 reps  ASSESSMENT:  CLINICAL IMPRESSION: Pt has attended 6 visits of PT thus far and is making steady progress towards goals. Strength has improved significantly from IE. Improvement also noted with 5xSTS, though some compensatory tactics observed. Pt does continue to rely on FWW outside of home  for ambulation.  Due to her progress thus far, pt will benefit from 3 additional weeks of PT to solidify HEP and hopefully transition to personal training program.      OBJECTIVE IMPAIRMENTS: Abnormal gait, decreased activity tolerance, decreased balance, decreased knowledge of use of DME, difficulty walking, and decreased strength.   ACTIVITY LIMITATIONS: carrying, lifting, standing, squatting, transfers, and locomotion level  PARTICIPATION LIMITATIONS: meal prep, cleaning, laundry, driving, shopping, and yard work  PERSONAL FACTORS: Age, Past/current experiences, and 1-2 comorbidities: right hip replacement, multiple   are also affecting patient's functional outcome.   REHAB POTENTIAL:  good   CLINICAL DECISION MAKING: Evolving/moderate complexity declining general mobility   EVALUATION COMPLEXITY: Moderate   GOALS: Goals reviewed with patient? Yes  SHORT TERM GOALS: Target date: 11/25/2023   Patient wil increase gross bilateral LE strength by 5 lbs  Baseline: Goal status: MET 2/10  2.  Patient will reduce 5x sit to stand time by 6  seconds Baseline:  Goal status: INITIAL  3.  Patient will be independent and complaint with base HEP  Baseline:  Goal status: MET 2/10   LONG TERM GOALS: Target date: 12/23/2023    Patient will rpeort decreased fatigue mbaulting in the community  Baseline:  Goal status: IN PROGRESS (has not walked enough to tell 2/10)  2.  Patient will go down steps with self reported improved stability   Baseline:  Goal status: IN PROGRESS (limited by L knee, otherwise I'm fine)  3.  Patient will be comfortable with gym program if she chooses to use her gym membership  Baseline:  Goal status: NOT MET (discussed gym membership and personal training with pt 2/10)   PLAN:  PT FREQUENCY: 1-2x/week  PT DURATION: 3 weeks  PLAN FOR NEXT SESSION: consider stair training; review seated series she was given. Consider supine series or standing series of  exercises. Consider progression out into the gym.      Dessie Coma, PT 01/19/2024, 12:41 PM

## 2024-01-19 NOTE — Patient Instructions (Addendum)
Medication Instructions:  Your physician has recommended you make the following change in your medication:   Stop: Clonidine    Follow-Up: Please follow up with Christina Sellers   &   6 months with [facility] with Christina Sellers or Christina Shields, NP    Special Instructions:  OUR BP GOAL FOR YOU IS 140/90   Tips to Measure your Blood Pressure Correctly   Here's what you can do to ensure a correct reading:  Don't drink a caffeinated beverage or smoke during the 30 minutes before the test.  Sit quietly for five minutes before the test begins.  During the measurement, sit in a chair with your feet on the floor and your arm supported so your elbow is at about heart level.  The inflatable part of the cuff should completely cover at least 80% of your upper arm, and the cuff should be placed on bare skin, not over a shirt.  Don't talk during the measurement.  Have your blood pressure measured twice, with a brief break in between. If the readings are different by 5 points or more, have it done a third time.  In 2017, new guidelines from the American Heart Association, the [employer] of [employer]iology, and nine other health organizations lowered the diagnosis of high blood pressure to 130/80 mm Hg or higher for all adults. The guidelines also redefined the various blood pressure categories to now include normal, elevated, Stage 1 hypertension, Stage 2 hypertension, and hypertensive crisis (see "Blood pressure categories").  Blood pressure categories  Blood pressure category SYSTOLIC (upper number)  DIASTOLIC (lower number)  Normal Less than 120 mm Hg and Less than 80 mm Hg  Elevated 120-129 mm Hg and Less than 80 mm Hg  High blood pressure: Stage 1 hypertension 130-139 mm Hg or 80-89 mm Hg  High blood pressure: Stage 2 hypertension 140 mm Hg or higher or 90 mm Hg or higher  Hypertensive crisis (consult your doctor immediately) Higher than 180 mm Hg and/or Higher than 120 mm Hg   Source: American Heart Association and American Stroke Association. For more on getting your blood pressure under control, buy Controlling Your Blood Pressure, a Special Health Report from [facility].   Blood Pressure Log   Date   Time  Blood Pressure  Example: Nov 1 9 AM 124/78

## 2024-01-20 ENCOUNTER — Encounter (HOSPITAL_BASED_OUTPATIENT_CLINIC_OR_DEPARTMENT_OTHER): Payer: Self-pay | Admitting: Physical Therapy

## 2024-01-23 ENCOUNTER — Ambulatory Visit (HOSPITAL_BASED_OUTPATIENT_CLINIC_OR_DEPARTMENT_OTHER): Payer: Medicare Other

## 2024-01-23 NOTE — Telephone Encounter (Addendum)
Spoke to daughter (checked DPR) Made pt aware Casey,RN sent  information in regard to memory puzzles and diet changes that we recommend to help with memory. I will email this information to her at [email or web address]  Daughter wanted an f/u appointment to discuss progression of Alzheimer's disease made appointment 04/2024 Made daughter aware that pt must be present to proceed with f/u appointment . Daughter expressed understanding and thanked me for calling . Placed pt on wait list

## 2024-01-23 NOTE — Telephone Encounter (Signed)
Spoke to daughter and stated:  brain amyloid PET scan suggests she has increased risk for Alzheimer's dementia.   Pt daughter voiced gratitude and understanding. Daughter stated that she spoke to Sharon previously who stated that she had some recommendations of brain activitives to help keep her memory active I will route to simone personally to see what she recommends

## 2024-01-26 ENCOUNTER — Ambulatory Visit (HOSPITAL_BASED_OUTPATIENT_CLINIC_OR_DEPARTMENT_OTHER): Payer: Medicare Other | Admitting: Physical Therapy

## 2024-01-26 ENCOUNTER — Telehealth (HOSPITAL_BASED_OUTPATIENT_CLINIC_OR_DEPARTMENT_OTHER): Payer: Self-pay

## 2024-01-26 NOTE — Telephone Encounter (Addendum)
Call attempted, no answer, VM full     ----- Message from Nurse Lendon Collar sent at 01/19/2024  3:08 PM EST ----- Bp check in

## 2024-01-27 NOTE — Telephone Encounter (Signed)
2nd call attempt,   Spoke with daughter (ok per DPR), daughter states they have been keeping a log, but they are out right now and do not have it. She states they will call Monday!

## 2024-01-29 ENCOUNTER — Emergency Department (HOSPITAL_COMMUNITY): Payer: Medicare Other

## 2024-01-29 ENCOUNTER — Encounter (HOSPITAL_COMMUNITY): Payer: Self-pay | Admitting: Internal Medicine

## 2024-01-29 ENCOUNTER — Inpatient Hospital Stay (HOSPITAL_COMMUNITY)
Admission: EM | Admit: 2024-01-29 | Discharge: 2024-02-03 | DRG: 963 | Disposition: A | Discharge status: Home Health | Payer: Medicare Other | Attending: Internal Medicine | Admitting: Internal Medicine

## 2024-01-29 ENCOUNTER — Other Ambulatory Visit: Payer: Self-pay

## 2024-01-29 DIAGNOSIS — E1122 Type 2 diabetes mellitus with diabetic chronic kidney disease: Secondary | ICD-10-CM | POA: Diagnosis present

## 2024-01-29 DIAGNOSIS — E039 Hypothyroidism, unspecified: Secondary | ICD-10-CM | POA: Diagnosis present

## 2024-01-29 DIAGNOSIS — E1151 Type 2 diabetes mellitus with diabetic peripheral angiopathy without gangrene: Secondary | ICD-10-CM | POA: Diagnosis present

## 2024-01-29 DIAGNOSIS — E119 Type 2 diabetes mellitus without complications: Secondary | ICD-10-CM | POA: Diagnosis not present

## 2024-01-29 DIAGNOSIS — I6523 Occlusion and stenosis of bilateral carotid arteries: Secondary | ICD-10-CM | POA: Diagnosis present

## 2024-01-29 DIAGNOSIS — I5032 Chronic diastolic (congestive) heart failure: Secondary | ICD-10-CM | POA: Diagnosis not present

## 2024-01-29 DIAGNOSIS — S299XXA Unspecified injury of thorax, initial encounter: Secondary | ICD-10-CM | POA: Diagnosis present

## 2024-01-29 DIAGNOSIS — M810 Age-related osteoporosis without current pathological fracture: Secondary | ICD-10-CM | POA: Diagnosis present

## 2024-01-29 DIAGNOSIS — J449 Chronic obstructive pulmonary disease, unspecified: Secondary | ICD-10-CM | POA: Diagnosis present

## 2024-01-29 DIAGNOSIS — Z801 Family history of malignant neoplasm of trachea, bronchus and lung: Secondary | ICD-10-CM

## 2024-01-29 DIAGNOSIS — F0394 Unspecified dementia, unspecified severity, with anxiety: Secondary | ICD-10-CM | POA: Diagnosis present

## 2024-01-29 DIAGNOSIS — E1165 Type 2 diabetes mellitus with hyperglycemia: Secondary | ICD-10-CM | POA: Diagnosis present

## 2024-01-29 DIAGNOSIS — I471 Supraventricular tachycardia, unspecified: Secondary | ICD-10-CM | POA: Diagnosis present

## 2024-01-29 DIAGNOSIS — R7401 Elevation of levels of liver transaminase levels: Secondary | ICD-10-CM | POA: Diagnosis not present

## 2024-01-29 DIAGNOSIS — S2241XA Multiple fractures of ribs, right side, initial encounter for closed fracture: Secondary | ICD-10-CM | POA: Diagnosis not present

## 2024-01-29 DIAGNOSIS — J4489 Other specified chronic obstructive pulmonary disease: Secondary | ICD-10-CM | POA: Diagnosis present

## 2024-01-29 DIAGNOSIS — R0781 Pleurodynia: Secondary | ICD-10-CM | POA: Diagnosis not present

## 2024-01-29 DIAGNOSIS — G4733 Obstructive sleep apnea (adult) (pediatric): Secondary | ICD-10-CM | POA: Diagnosis present

## 2024-01-29 DIAGNOSIS — W19XXXA Unspecified fall, initial encounter: Principal | ICD-10-CM | POA: Diagnosis present

## 2024-01-29 DIAGNOSIS — Z803 Family history of malignant neoplasm of breast: Secondary | ICD-10-CM

## 2024-01-29 DIAGNOSIS — Z7189 Other specified counseling: Secondary | ICD-10-CM

## 2024-01-29 DIAGNOSIS — I4719 Other supraventricular tachycardia: Secondary | ICD-10-CM | POA: Diagnosis not present

## 2024-01-29 DIAGNOSIS — Z96641 Presence of right artificial hip joint: Secondary | ICD-10-CM | POA: Diagnosis present

## 2024-01-29 DIAGNOSIS — J939 Pneumothorax, unspecified: Secondary | ICD-10-CM | POA: Diagnosis present

## 2024-01-29 DIAGNOSIS — S270XXA Traumatic pneumothorax, initial encounter: Principal | ICD-10-CM | POA: Diagnosis present

## 2024-01-29 DIAGNOSIS — D6489 Other specified anemias: Secondary | ICD-10-CM | POA: Diagnosis not present

## 2024-01-29 DIAGNOSIS — J9811 Atelectasis: Secondary | ICD-10-CM | POA: Diagnosis not present

## 2024-01-29 DIAGNOSIS — S065XAA Traumatic subdural hemorrhage with loss of consciousness status unknown, initial encounter: Secondary | ICD-10-CM | POA: Diagnosis not present

## 2024-01-29 DIAGNOSIS — G9341 Metabolic encephalopathy: Secondary | ICD-10-CM | POA: Diagnosis not present

## 2024-01-29 DIAGNOSIS — M87852 Other osteonecrosis, left femur: Secondary | ICD-10-CM | POA: Diagnosis not present

## 2024-01-29 DIAGNOSIS — M545 Low back pain, unspecified: Secondary | ICD-10-CM | POA: Diagnosis present

## 2024-01-29 DIAGNOSIS — D631 Anemia in chronic kidney disease: Secondary | ICD-10-CM | POA: Diagnosis not present

## 2024-01-29 DIAGNOSIS — W1830XA Fall on same level, unspecified, initial encounter: Secondary | ICD-10-CM | POA: Diagnosis present

## 2024-01-29 DIAGNOSIS — Z66 Do not resuscitate: Secondary | ICD-10-CM | POA: Diagnosis not present

## 2024-01-29 DIAGNOSIS — Z79899 Other long term (current) drug therapy: Secondary | ICD-10-CM

## 2024-01-29 DIAGNOSIS — F0393 Unspecified dementia, unspecified severity, with mood disturbance: Secondary | ICD-10-CM | POA: Diagnosis present

## 2024-01-29 DIAGNOSIS — M109 Gout, unspecified: Secondary | ICD-10-CM | POA: Diagnosis present

## 2024-01-29 DIAGNOSIS — I700 Atherosclerosis of aorta: Secondary | ICD-10-CM | POA: Diagnosis not present

## 2024-01-29 DIAGNOSIS — N183 Chronic kidney disease, stage 3 unspecified: Secondary | ICD-10-CM | POA: Diagnosis present

## 2024-01-29 DIAGNOSIS — Z91048 Other nonmedicinal substance allergy status: Secondary | ICD-10-CM

## 2024-01-29 DIAGNOSIS — Y92009 Unspecified place in unspecified non-institutional (private) residence as the place of occurrence of the external cause: Secondary | ICD-10-CM | POA: Diagnosis not present

## 2024-01-29 DIAGNOSIS — N178 Other acute kidney failure: Secondary | ICD-10-CM | POA: Diagnosis not present

## 2024-01-29 DIAGNOSIS — I10 Essential (primary) hypertension: Secondary | ICD-10-CM | POA: Diagnosis not present

## 2024-01-29 DIAGNOSIS — N1831 Chronic kidney disease, stage 3a: Secondary | ICD-10-CM | POA: Diagnosis not present

## 2024-01-29 DIAGNOSIS — E7800 Pure hypercholesterolemia, unspecified: Secondary | ICD-10-CM | POA: Diagnosis present

## 2024-01-29 DIAGNOSIS — Z87891 Personal history of nicotine dependence: Secondary | ICD-10-CM

## 2024-01-29 DIAGNOSIS — Z87440 Personal history of urinary (tract) infections: Secondary | ICD-10-CM

## 2024-01-29 DIAGNOSIS — N179 Acute kidney failure, unspecified: Secondary | ICD-10-CM | POA: Diagnosis present

## 2024-01-29 DIAGNOSIS — M1712 Unilateral primary osteoarthritis, left knee: Secondary | ICD-10-CM | POA: Diagnosis present

## 2024-01-29 DIAGNOSIS — Z743 Need for continuous supervision: Secondary | ICD-10-CM | POA: Diagnosis not present

## 2024-01-29 DIAGNOSIS — S8011XA Contusion of right lower leg, initial encounter: Secondary | ICD-10-CM | POA: Diagnosis present

## 2024-01-29 DIAGNOSIS — G9389 Other specified disorders of brain: Secondary | ICD-10-CM | POA: Diagnosis not present

## 2024-01-29 DIAGNOSIS — R9082 White matter disease, unspecified: Secondary | ICD-10-CM | POA: Diagnosis not present

## 2024-01-29 DIAGNOSIS — Z043 Encounter for examination and observation following other accident: Secondary | ICD-10-CM | POA: Diagnosis not present

## 2024-01-29 DIAGNOSIS — K869 Disease of pancreas, unspecified: Secondary | ICD-10-CM | POA: Diagnosis present

## 2024-01-29 DIAGNOSIS — S2231XA Fracture of one rib, right side, initial encounter for closed fracture: Secondary | ICD-10-CM | POA: Diagnosis not present

## 2024-01-29 DIAGNOSIS — I6782 Cerebral ischemia: Secondary | ICD-10-CM | POA: Diagnosis not present

## 2024-01-29 DIAGNOSIS — R569 Unspecified convulsions: Secondary | ICD-10-CM | POA: Diagnosis not present

## 2024-01-29 DIAGNOSIS — M3500 Sicca syndrome, unspecified: Secondary | ICD-10-CM | POA: Diagnosis present

## 2024-01-29 DIAGNOSIS — M4313 Spondylolisthesis, cervicothoracic region: Secondary | ICD-10-CM | POA: Diagnosis not present

## 2024-01-29 DIAGNOSIS — R918 Other nonspecific abnormal finding of lung field: Secondary | ICD-10-CM | POA: Diagnosis not present

## 2024-01-29 DIAGNOSIS — N189 Chronic kidney disease, unspecified: Secondary | ICD-10-CM | POA: Diagnosis not present

## 2024-01-29 DIAGNOSIS — Z881 Allergy status to other antibiotic agents status: Secondary | ICD-10-CM

## 2024-01-29 DIAGNOSIS — K5730 Diverticulosis of large intestine without perforation or abscess without bleeding: Secondary | ICD-10-CM | POA: Diagnosis not present

## 2024-01-29 DIAGNOSIS — Z808 Family history of malignant neoplasm of other organs or systems: Secondary | ICD-10-CM

## 2024-01-29 DIAGNOSIS — H9190 Unspecified hearing loss, unspecified ear: Secondary | ICD-10-CM | POA: Diagnosis present

## 2024-01-29 DIAGNOSIS — R6889 Other general symptoms and signs: Secondary | ICD-10-CM | POA: Diagnosis not present

## 2024-01-29 DIAGNOSIS — I130 Hypertensive heart and chronic kidney disease with heart failure and stage 1 through stage 4 chronic kidney disease, or unspecified chronic kidney disease: Secondary | ICD-10-CM | POA: Diagnosis not present

## 2024-01-29 DIAGNOSIS — Z79890 Hormone replacement therapy (postmenopausal): Secondary | ICD-10-CM

## 2024-01-29 DIAGNOSIS — Z833 Family history of diabetes mellitus: Secondary | ICD-10-CM

## 2024-01-29 DIAGNOSIS — N1832 Chronic kidney disease, stage 3b: Secondary | ICD-10-CM | POA: Diagnosis present

## 2024-01-29 DIAGNOSIS — K5900 Constipation, unspecified: Secondary | ICD-10-CM | POA: Diagnosis present

## 2024-01-29 DIAGNOSIS — G8929 Other chronic pain: Secondary | ICD-10-CM | POA: Diagnosis present

## 2024-01-29 DIAGNOSIS — M549 Dorsalgia, unspecified: Secondary | ICD-10-CM | POA: Diagnosis not present

## 2024-01-29 DIAGNOSIS — Z885 Allergy status to narcotic agent status: Secondary | ICD-10-CM

## 2024-01-29 DIAGNOSIS — Z7984 Long term (current) use of oral hypoglycemic drugs: Secondary | ICD-10-CM

## 2024-01-29 DIAGNOSIS — Z888 Allergy status to other drugs, medicaments and biological substances status: Secondary | ICD-10-CM

## 2024-01-29 DIAGNOSIS — Z882 Allergy status to sulfonamides status: Secondary | ICD-10-CM

## 2024-01-29 DIAGNOSIS — I6529 Occlusion and stenosis of unspecified carotid artery: Secondary | ICD-10-CM | POA: Diagnosis not present

## 2024-01-29 DIAGNOSIS — F03918 Unspecified dementia, unspecified severity, with other behavioral disturbance: Secondary | ICD-10-CM | POA: Diagnosis present

## 2024-01-29 DIAGNOSIS — K219 Gastro-esophageal reflux disease without esophagitis: Secondary | ICD-10-CM | POA: Diagnosis present

## 2024-01-29 DIAGNOSIS — R109 Unspecified abdominal pain: Secondary | ICD-10-CM | POA: Diagnosis present

## 2024-01-29 DIAGNOSIS — Z974 Presence of external hearing-aid: Secondary | ICD-10-CM

## 2024-01-29 DIAGNOSIS — J329 Chronic sinusitis, unspecified: Secondary | ICD-10-CM | POA: Diagnosis not present

## 2024-01-29 DIAGNOSIS — F419 Anxiety disorder, unspecified: Secondary | ICD-10-CM | POA: Diagnosis present

## 2024-01-29 DIAGNOSIS — G319 Degenerative disease of nervous system, unspecified: Secondary | ICD-10-CM | POA: Diagnosis not present

## 2024-01-29 DIAGNOSIS — I517 Cardiomegaly: Secondary | ICD-10-CM | POA: Diagnosis not present

## 2024-01-29 DIAGNOSIS — S2249XA Multiple fractures of ribs, unspecified side, initial encounter for closed fracture: Secondary | ICD-10-CM | POA: Diagnosis present

## 2024-01-29 LAB — CBC WITH DIFFERENTIAL/PLATELET
Abs Immature Granulocytes: 0.11 10*3/uL — ABNORMAL HIGH (ref 0.00–0.07)
Basophils Absolute: 0.1 10*3/uL (ref 0.0–0.1)
Basophils Relative: 1 %
Eosinophils Absolute: 0.1 10*3/uL (ref 0.0–0.5)
Eosinophils Relative: 1 %
HCT: 44.5 % (ref 36.0–46.0)
Hemoglobin: 14.4 g/dL (ref 12.0–15.0)
Immature Granulocytes: 1 %
Lymphocytes Relative: 9 %
Lymphs Abs: 1.2 10*3/uL (ref 0.7–4.0)
MCH: 31 pg (ref 26.0–34.0)
MCHC: 32.4 g/dL (ref 30.0–36.0)
MCV: 95.7 fL (ref 80.0–100.0)
Monocytes Absolute: 0.8 10*3/uL (ref 0.1–1.0)
Monocytes Relative: 6 %
Neutro Abs: 11 10*3/uL — ABNORMAL HIGH (ref 1.7–7.7)
Neutrophils Relative %: 82 %
Platelets: 202 10*3/uL (ref 150–400)
RBC: 4.65 MIL/uL (ref 3.87–5.11)
RDW: 13.4 % (ref 11.5–15.5)
WBC: 13.3 10*3/uL — ABNORMAL HIGH (ref 4.0–10.5)
nRBC: 0 % (ref 0.0–0.2)

## 2024-01-29 LAB — COMPREHENSIVE METABOLIC PANEL
ALT: 15 U/L (ref 0–44)
AST: 26 U/L (ref 15–41)
Albumin: 4.8 g/dL (ref 3.5–5.0)
Alkaline Phosphatase: 89 U/L (ref 38–126)
Anion gap: 17 — ABNORMAL HIGH (ref 5–15)
BUN: 40 mg/dL — ABNORMAL HIGH (ref 8–23)
CO2: 17 mmol/L — ABNORMAL LOW (ref 22–32)
Calcium: 9.7 mg/dL (ref 8.9–10.3)
Chloride: 101 mmol/L (ref 98–111)
Creatinine, Ser: 2.31 mg/dL — ABNORMAL HIGH (ref 0.44–1.00)
GFR, Estimated: 20 mL/min — ABNORMAL LOW (ref >60)
Glucose, Bld: 167 mg/dL — ABNORMAL HIGH (ref 70–99)
Potassium: 3.1 mmol/L — ABNORMAL LOW (ref 3.5–5.1)
Sodium: 135 mmol/L (ref 135–145)
Total Bilirubin: 1.2 mg/dL (ref 0.0–1.2)
Total Protein: 8.1 g/dL (ref 6.5–8.1)

## 2024-01-29 LAB — CBG MONITORING, ED: Glucose-Capillary: 138 mg/dL — ABNORMAL HIGH (ref 70–99)

## 2024-01-29 LAB — HEMOGLOBIN A1C
Hgb A1c MFr Bld: 6.1 % — ABNORMAL HIGH (ref 4.8–5.6)
Mean Plasma Glucose: 128.37 mg/dL

## 2024-01-29 LAB — MAGNESIUM: Magnesium: 2.1 mg/dL (ref 1.7–2.4)

## 2024-01-29 LAB — LIPASE, BLOOD: Lipase: 32 U/L (ref 11–51)

## 2024-01-29 LAB — CK: Total CK: 77 U/L (ref 38–234)

## 2024-01-29 LAB — PHOSPHORUS: Phosphorus: 3.3 mg/dL (ref 2.5–4.6)

## 2024-01-29 LAB — GLUCOSE, CAPILLARY: Glucose-Capillary: 132 mg/dL — ABNORMAL HIGH (ref 70–99)

## 2024-01-29 MED ORDER — PANTOPRAZOLE SODIUM 40 MG PO TBEC
40.0000 mg | DELAYED_RELEASE_TABLET | Freq: Two times a day (BID) | ORAL | Status: DC
  Administered 2024-01-29 – 2024-02-03 (×8): 40 mg via ORAL
  Filled 2024-01-29 (×9): qty 1

## 2024-01-29 MED ORDER — POTASSIUM CHLORIDE 10 MEQ/100ML IV SOLN
10.0000 meq | INTRAVENOUS | Status: AC
  Administered 2024-01-29 (×2): 10 meq via INTRAVENOUS
  Filled 2024-01-29 (×2): qty 100

## 2024-01-29 MED ORDER — LEVOTHYROXINE SODIUM 75 MCG PO TABS
75.0000 ug | ORAL_TABLET | Freq: Every morning | ORAL | Status: DC
  Administered 2024-01-29 – 2024-02-03 (×6): 75 ug via ORAL
  Filled 2024-01-29 (×6): qty 1

## 2024-01-29 MED ORDER — MORPHINE SULFATE (PF) 2 MG/ML IV SOLN
2.0000 mg | Freq: Once | INTRAVENOUS | Status: AC
  Administered 2024-01-29: 2 mg via INTRAVENOUS
  Filled 2024-01-29: qty 1

## 2024-01-29 MED ORDER — ONDANSETRON HCL 4 MG/2ML IJ SOLN
4.0000 mg | Freq: Four times a day (QID) | INTRAMUSCULAR | Status: DC | PRN
  Administered 2024-02-02: 4 mg via INTRAVENOUS
  Filled 2024-01-29: qty 2

## 2024-01-29 MED ORDER — POTASSIUM CHLORIDE CRYS ER 10 MEQ PO TBCR
10.0000 meq | EXTENDED_RELEASE_TABLET | Freq: Once | ORAL | Status: AC
  Administered 2024-01-29: 10 meq via ORAL
  Filled 2024-01-29: qty 1

## 2024-01-29 MED ORDER — AMLODIPINE BESYLATE 5 MG PO TABS
5.0000 mg | ORAL_TABLET | Freq: Two times a day (BID) | ORAL | Status: DC
  Administered 2024-01-29 – 2024-02-03 (×10): 5 mg via ORAL
  Filled 2024-01-29 (×6): qty 1
  Filled 2024-01-29: qty 2
  Filled 2024-01-29 (×5): qty 1

## 2024-01-29 MED ORDER — MEMANTINE HCL 5 MG PO TABS
10.0000 mg | ORAL_TABLET | Freq: Two times a day (BID) | ORAL | Status: DC
Start: 2024-01-29 — End: 2024-01-29

## 2024-01-29 MED ORDER — OXYCODONE HCL 5 MG PO TABS
2.5000 mg | ORAL_TABLET | ORAL | Status: DC | PRN
  Administered 2024-01-30 – 2024-02-01 (×3): 5 mg via ORAL
  Filled 2024-01-29 (×3): qty 1

## 2024-01-29 MED ORDER — HYDROMORPHONE HCL 1 MG/ML IJ SOLN
0.5000 mg | INTRAMUSCULAR | Status: DC | PRN
  Administered 2024-01-29 – 2024-02-01 (×4): 0.5 mg via INTRAVENOUS
  Filled 2024-01-29 (×4): qty 1

## 2024-01-29 MED ORDER — SUCRALFATE 1 G PO TABS
1.0000 g | ORAL_TABLET | Freq: Two times a day (BID) | ORAL | Status: DC
  Administered 2024-01-29 – 2024-02-03 (×10): 1 g via ORAL
  Filled 2024-01-29 (×11): qty 1

## 2024-01-29 MED ORDER — ONDANSETRON HCL 4 MG PO TABS
4.0000 mg | ORAL_TABLET | Freq: Four times a day (QID) | ORAL | Status: DC | PRN
Start: 2024-01-29 — End: 2024-02-03

## 2024-01-29 MED ORDER — SODIUM CHLORIDE 0.9 % IV BOLUS
500.0000 mL | Freq: Once | INTRAVENOUS | Status: AC
  Administered 2024-01-29: 500 mL via INTRAVENOUS

## 2024-01-29 MED ORDER — METHOCARBAMOL 500 MG PO TABS
250.0000 mg | ORAL_TABLET | Freq: Four times a day (QID) | ORAL | Status: DC | PRN
  Administered 2024-01-31 – 2024-02-03 (×6): 250 mg via ORAL
  Filled 2024-01-29 (×7): qty 1

## 2024-01-29 MED ORDER — LORAZEPAM 2 MG/ML IJ SOLN
1.0000 mg | Freq: Once | INTRAMUSCULAR | Status: AC
  Administered 2024-01-29: 1 mg via INTRAVENOUS
  Filled 2024-01-29: qty 1

## 2024-01-29 MED ORDER — ACETAMINOPHEN 325 MG PO TABS
650.0000 mg | ORAL_TABLET | Freq: Four times a day (QID) | ORAL | Status: DC
  Administered 2024-01-30 – 2024-02-03 (×16): 650 mg via ORAL
  Filled 2024-01-29 (×18): qty 2

## 2024-01-29 MED ORDER — SODIUM CHLORIDE 0.9 % IV SOLN: INTRAVENOUS | Status: AC

## 2024-01-29 MED ORDER — ACETAMINOPHEN 500 MG PO TABS
1000.0000 mg | ORAL_TABLET | Freq: Four times a day (QID) | ORAL | Status: DC
  Administered 2024-01-29: 1000 mg via ORAL
  Filled 2024-01-29: qty 2

## 2024-01-29 MED ORDER — MEMANTINE HCL 10 MG PO TABS
5.0000 mg | ORAL_TABLET | Freq: Two times a day (BID) | ORAL | Status: DC
  Administered 2024-01-29 – 2024-02-03 (×10): 5 mg via ORAL
  Filled 2024-01-29 (×11): qty 1

## 2024-01-29 MED ORDER — ONDANSETRON HCL 4 MG/2ML IJ SOLN
4.0000 mg | Freq: Once | INTRAMUSCULAR | Status: AC
  Administered 2024-01-29: 4 mg via INTRAVENOUS
  Filled 2024-01-29: qty 2

## 2024-01-29 MED ORDER — ESCITALOPRAM OXALATE 10 MG PO TABS
10.0000 mg | ORAL_TABLET | Freq: Every day | ORAL | Status: DC
  Administered 2024-01-30 – 2024-02-02 (×4): 10 mg via ORAL
  Filled 2024-01-29 (×5): qty 1

## 2024-01-29 MED ORDER — CYCLOSPORINE 0.05 % OP EMUL
1.0000 [drp] | Freq: Two times a day (BID) | OPHTHALMIC | Status: DC
  Administered 2024-01-30 – 2024-02-03 (×9): 1 [drp] via OPHTHALMIC
  Filled 2024-01-29 (×11): qty 30

## 2024-01-29 NOTE — Consult Note (Addendum)
 Surgical Evaluation Requesting provider: Alona Bene MD  Chief Complaint: fall  HPI: 83 year old woman with multiple medical problems as listed below including heart failure, COPD, T2DM, likely CKD and osteopenia who presented by EMS around 3:00 this morning after a fall at home, where she lives with a 24-hour caregiver.  She was walking to the bathroom when she fell and struck her right side.  Denies loss of consciousness or head trauma, but did report a couple episodes of emesis prior to arriving and does not recall the event.  No anticoagulants.    Reports pain in the right side of her chest which is exacerbated by moving or trying to take a deep breath but otherwise has been stable since presentation, saturating 98% on room air, no tachycardia or tachypnea.  She has undergone a CT of the head, C-spine, and chest abdomen pelvis which I have reviewed demonstrating right sided rib fractures and pneumothorax.  Trauma consultation requested.  Allergies  Allergen Reactions   Tape Other (See Comments)    SKIN IS VERY THIN- TAPE TEARS VERY EASILY!!   Celexa [Citalopram Hydrobromide] Other (See Comments)    Nightmares    Fluconazole Other (See Comments)    Unknown reaction   Green Tea (Camellia Sinensis)     Other Reaction(s): mouth ulcers   Green Tea Leaf Ext Other (See Comments)    Mouth ulcers   Hydralazine Hcl     Other Reaction(s): easy bruising   Kenalog [Triamcinolone] Other (See Comments)    Spinal injection caused sweats   Levaquin [Levofloxacin] Hives   Losartan Potassium-Hctz Other (See Comments)    Night sweats/ fatigue   Magnesium Citrate Other (See Comments)   Methocarbamol Other (See Comments)    500 mg is too much- can tolerate a lesser dose   Nitrofurantoin Macrocrystal Other (See Comments)    Possible vertigo   Scopolamine Other (See Comments)    Memory change   Sulfa Antibiotics Other (See Comments)    Reaction not recalled by the patient   Tea     Other  reaction(s): Not available   Tramadol     Makes pt feel loopy   Trimethoprim Hives    Past Medical History:  Diagnosis Date   Abnormal Pap smear of vagina    Allergic sinusitis    Anxiety    Atherosclerotic peripheral vascular disease (HCC)    Carotid artery stenosis    Cellulitis    COPD with asthma (HCC)    not an issue now   Depression    Diabetes mellitus type 2, uncomplicated (HCC)    DJD (degenerative joint disease)    Elevated cholesterol    Elevated MCV    secondary to alchol   GERD (gastroesophageal reflux disease)    not an issue now.   Gout    Hearing difficulty    bilateral hearing aids   History of recurrent UTIs    Hypertension    loss weight'off meds now"   Hypothyroidism    IBS (irritable bowel syndrome)    Insomnia    Labial fusion    Osteoarthritis of left knee    Osteopenia    Recurrent vaginitis    Sjogren's syndrome (HCC)    Syncope 11.7.14   secondary to dehydration and possible hypoglycemia   Tendonitis    Thyroid disease    Transfusion history    age 50 "anemia"    Past Surgical History:  Procedure Laterality Date   CATARACT EXTRACTION, BILATERAL Bilateral  CHOLECYSTECTOMY     open   COLONOSCOPY WITH PROPOFOL N/A 03/23/2016   Procedure: COLONOSCOPY WITH PROPOFOL;  Surgeon: Charolett Bumpers, MD;  Location: [facility];  Service: Endoscopy;  Laterality: N/A;   EYE SURGERY     FOOT SURGERY Left    retained hardware   KNEE SURGERY Left    scope    TONSILLECTOMY     TOTAL HIP ARTHROPLASTY Right 03/30/2021   Procedure: RIGHTTOTAL HIP ARTHROPLASTY ANTERIOR APPROACH;  Surgeon: Gean Birchwood, MD;  Location: [facility];  Service: Orthopedics;  Laterality: Right;    Family History  Problem Relation Age of Onset   Diabetes Maternal Aunt    Cancer Father        Oral cancer   Breast cancer Sister        Age 43's   Cancer Brother        Lung cancer    Social History   Socioeconomic History   Marital status: Widowed    Spouse name: Not  on file   Number of children: Not on file   Years of education: Not on file   Highest education level: Not on file  Occupational History   Not on file  Tobacco Use   Smoking status: Former    Current packs/day: 0.00    Types: Cigarettes    Quit date: 02/21/1982    Years since quitting: 41.9   Smokeless tobacco: Never  Vaping Use   Vaping status: Never Used  Substance and Sexual Activity   Alcohol use: No   Drug use: No   Sexual activity: Never    Birth control/protection: Post-menopausal  Other Topics Concern   Not on file  Social History Narrative   Not on file   Social Drivers of Health   Financial Resource Strain: Low Risk  (07/15/2023)   Overall Financial Resource Strain (CARDIA)    Difficulty of Paying Living Expenses: Not hard at all  Food Insecurity: No Food Insecurity (07/15/2023)   Hunger Vital Sign    Worried About Running Out of Food in the Last Year: Never true    Ran Out of Food in the Last Year: Never true  Transportation Needs: No Transportation Needs (07/15/2023)   PRAPARE - Administrator, Civil Service (Medical): No    Lack of Transportation (Non-Medical): No  Physical Activity: Inactive (07/15/2023)   Exercise Vital Sign    Days of Exercise per Week: 0 days    Minutes of Exercise per Session: 0 min  Stress: Not on file  Social Connections: Not on file    No current facility-administered medications on file prior to encounter.   Current Outpatient Medications on File Prior to Encounter  Medication Sig Dispense Refill   acetaminophen (TYLENOL) 650 MG CR tablet 2 tablets as needed for pain.     amLODipine (NORVASC) 5 MG tablet Take 5 mg by mouth daily.     ascorbic acid (VITAMIN C) 500 MG tablet Take 500 mg by mouth daily.     cetirizine (ZYRTEC ALLERGY) 10 MG tablet as needed for allergies.     Cholecalciferol (VITAMIN D3) 50 MCG (2000 UT) capsule Take 2,000 mg by mouth daily.     clobetasol ointment (TEMOVATE) 0.05 % Apply topically 2 (two)  times a week. 30 g 1   denosumab (PROLIA) 60 MG/ML SOSY injection Inject 60 mg into the skin every 6 (six) months.     diclofenac sodium (VOLTAREN) 1 % GEL Apply 1 application topically 2 (two)  times daily as needed (to painful sites).     empagliflozin (JARDIANCE) 10 MG TABS tablet Take by mouth daily.     escitalopram (LEXAPRO) 5 MG tablet Take 5 mg by mouth daily.     fluticasone (FLONASE) 50 MCG/ACT nasal spray Place 1 spray into both nostrils daily as needed (for seasonal allergies).     folic acid (FOLVITE) 1 MG tablet Take 1 tablet (1 mg total) by mouth daily. 100 tablet 3   furosemide (LASIX) 20 MG tablet Take 1 tablet (20 mg total) by mouth daily. 90 tablet 3   levothyroxine (SYNTHROID) 75 MCG tablet Take 75 mcg by mouth every morning.     meloxicam (MOBIC) 7.5 MG tablet Take by mouth daily as needed.     memantine (NAMENDA) 10 MG tablet Take 1 tablet (10 mg total) by mouth 2 (two) times daily. 60 tablet 3   Polyvinyl Alcohol-Povidone PF (REFRESH) 1.4-0.6 % SOLN as needed.     RESTASIS 0.05 % ophthalmic emulsion Place 1 drop into both eyes 2 (two) times daily.     rosuvastatin (CRESTOR) 5 MG tablet Take 5 mg by mouth daily.     spironolactone (ALDACTONE) 25 MG tablet Take 1 tablet (25 mg total) by mouth daily. 90 tablet 3   sucralfate (CARAFATE) 1 g tablet Take 1 g by mouth 2 (two) times daily.     vitamin E 200 UNIT capsule Take 200 Units by mouth daily.      Review of Systems: a complete, 10pt review of systems was completed with pertinent positives and negatives as documented in the HPI  Physical Exam: Vitals:   01/29/24 0315 01/29/24 0500  BP: (!) 165/56 (!) 159/60  Pulse: 88 81  Resp: (!) 22 13  Temp: (!) 97.5 F (36.4 C)   SpO2: 95% 98%   Gen: Alert, calm, frail appearing Eyes: lids and conjunctivae normal, no icterus. Pupils equally round and reactive to light.  Neck: supple without mass or thyromegaly.  Trachea midline, no crepitus or hematoma.  No midline C-spine  tenderness. Chest: respiratory effort is normal.  Tenderness without ecchymosis or hematoma to the right lateral chest wall; breath sounds equal.  Cardiovascular: RRR Gastrointestinal: soft, nondistended, nontender. No mass, hepatomegaly or splenomegaly.  Well-healed right subcostal scar.  No hernia. Muscoloskeletal: no clubbing or cyanosis of the fingers.  Strength is symmetrical throughout.  Range of motion of bilateral upper and lower extremities normal without pain, crepitation or contracture. Neuro: cranial nerves grossly intact.  Sensation intact to light touch diffusely. Skin: warm and dry      Latest Ref Rng & Units 01/29/2024    3:28 AM 05/01/2023    4:42 AM 04/14/2023    4:12 AM  CBC  WBC 4.0 - 10.5 K/uL 13.3  10.7  9.6   Hemoglobin 12.0 - 15.0 g/dL 16.1  09.6  04.5   Hematocrit 36.0 - 46.0 % 44.5  36.6  39.7   Platelets 150 - 400 K/uL 202  150  177        Latest Ref Rng & Units 01/29/2024    3:28 AM 07/15/2023    1:21 PM 06/30/2023   11:42 AM  CMP  Glucose 70 - 99 mg/dL 409  811  914   BUN 8 - 23 mg/dL 40  23  19   Creatinine 0.44 - 1.00 mg/dL 7.82  9.56  2.13   Sodium 135 - 145 mmol/L 135  137  136   Potassium 3.5 - 5.1 mmol/L  3.1  4.0  3.2   Chloride 98 - 111 mmol/L 101  96  95   CO2 22 - 32 mmol/L 17  23  25    Calcium 8.9 - 10.3 mg/dL 9.7  9.6  9.8   Total Protein 6.5 - 8.1 g/dL 8.1     Total Bilirubin 0.0 - 1.2 mg/dL 1.2     Alkaline Phos 38 - 126 U/L 89     AST 15 - 41 U/L 26     ALT 0 - 44 U/L 15       Lab Results  Component Value Date   INR 1.0 03/23/2021   INR 1.0 02/01/2021    Imaging: CT Cervical Spine Wo Contrast Result Date: 01/29/2024 CLINICAL DATA:  83 year old female status post fall while walking to bathroom. EXAM: CT CERVICAL SPINE WITHOUT CONTRAST TECHNIQUE: Multidetector CT imaging of the cervical spine was performed without intravenous contrast. Multiplanar CT image reconstructions were also generated. RADIATION DOSE REDUCTION: This exam was  performed according to the departmental dose-optimization program which includes automated exposure control, adjustment of the mA and/or kV according to patient size and/or use of iterative reconstruction technique. COMPARISON:  Cervical spine CT 09/12/2022. FINDINGS: Alignment: Stable cervical lordosis since 2023. Stable mild degenerative chronic anterolisthesis at C7-T1. Maintained posterior element alignment. Skull base and vertebrae: Bone mineralization is within normal limits for age. Visualized skull base is intact. No atlanto-occipital dissociation. C1 and C2 appear intact and aligned. No acute osseous abnormality identified. Soft tissues and spinal canal: No prevertebral fluid or swelling. No visible canal hematoma. Bulky calcified cervical carotid atherosclerosis but otherwise negative visible noncontrast neck soft tissues. Disc levels:  Stable mild for age cervical spine degeneration. Upper chest: Visible upper thoracic levels appear grossly intact. Mild apical lung scarring. IMPRESSION: 1. No acute traumatic injury identified in the cervical spine. 2. Stable cervical spine degeneration, generally mild for age. 3. Bulky calcified cervical carotid atherosclerosis. Electronically Signed   By: Odessa Fleming M.D.   On: 01/29/2024 07:02   CT Head Wo Contrast Result Date: 01/29/2024 CLINICAL DATA:  83 year old female status post fall while walking to bathroom. EXAM: CT HEAD WITHOUT CONTRAST TECHNIQUE: Contiguous axial images were obtained from the base of the skull through the vertex without intravenous contrast. RADIATION DOSE REDUCTION: This exam was performed according to the departmental dose-optimization program which includes automated exposure control, adjustment of the mA and/or kV according to patient size and/or use of iterative reconstruction technique. COMPARISON:  Brain MRI 10/03/2023.  Head CT 08/03/2023. FINDINGS: Brain: Stable cerebral volume. No midline shift, ventriculomegaly, mass effect,  evidence of mass lesion, intracranial hemorrhage or evidence of cortically based acute infarction. Patchy and confluent chronic cerebral white matter hypodensity appears stable. Stable gray-white matter differentiation throughout the brain. Stable punctate dystrophic calcifications in the posterior hemispheres. Vascular: Calcified atherosclerosis at the skull base. No suspicious intracranial vascular hyperdensity. Skull: Stable.  No acute osseous abnormality identified. Sinuses/Orbits: Visualized paranasal sinuses and mastoids are stable and well aerated. Other: No acute orbit or scalp soft tissue injury identified. IMPRESSION: 1. No acute intracranial abnormality or acute traumatic injury identified. 2. Stable non contrast CT appearance of chronic white matter disease. Electronically Signed   By: Odessa Fleming M.D.   On: 01/29/2024 07:00     A/P: 83 year old s/p fall  -Right 7-9 rib fx + R ptx: multimodal pain control, aggressive pulmonary toilet; chest xray this afternoon. Low threshold for chest tubeplacement if develops hypoxia.  Trauma service will follow, recommend medical  admission at [facility].  Incidental findings low-density 8 to 9 mm pancreatic tail lesion-follow-up with PCP for MRI in 6 months; diverticulosis, avascular necrosis of left femoral head, aortic atherosclerosis, bulky calcified carotid atherosclerosis, cervical spine degeneration mild for age,,\ stable chronic white matter disease   Addendum: The x-ray that was ordered to be done this afternoon was instead done at 8 AM.  I have reviewed this and it shows a small apical pneumothorax.  O2 sats appear to still be excellent.  Chest x-ray ordered for tomorrow morning.  Patient Active Problem List   Diagnosis Date Noted   Paroxysmal SVT (supraventricular tachycardia) (HCC) 06/30/2023   Bilateral carotid artery stenosis 01/21/2023   Abnormal finding of blood chemistry, unspecified 05/13/2021   Acquired hallux valgus 05/13/2021    Age-related osteoporosis without current pathological fracture 05/13/2021   Allergic rhinitis 05/13/2021   Anxiety disorder 05/13/2021   Apnea 05/13/2021   Avascular necrosis of bone of hip (HCC) 05/13/2021   Benign paroxysmal positional vertigo 05/13/2021   Bilateral tinnitus 05/13/2021   Bronchospasm 05/13/2021   Chronic kidney disease, stage 3 unspecified (HCC) 05/13/2021   Chronic obstructive pulmonary disease, unspecified (HCC) 05/13/2021   Constipation 05/13/2021   Costochondritis 05/13/2021   Disequilibrium 05/13/2021   Dysphagia 05/13/2021   DOE (dyspnea on exertion) 05/13/2021   Elevated blood-pressure reading, without diagnosis of hypertension 05/13/2021   Fatigue 05/13/2021   Gastro-esophageal reflux disease without esophagitis 05/13/2021   Generalized hyperhidrosis 05/13/2021   Gout 05/13/2021   Hair loss disorder 05/13/2021   Hematochezia 05/13/2021   Hemorrhage of colon due to diverticulosis 05/13/2021   Hypersomnia 05/13/2021   Indigestion 05/13/2021   Labyrinthitis of both ears 05/13/2021   Left flank pain 05/13/2021   Leukocytosis 05/13/2021   Lichen planus 05/13/2021   Localized swelling, mass and lump, neck 05/13/2021   Major depression, single episode 05/13/2021   Memory impairment 05/13/2021   Myalgia 05/13/2021   Night sweats 05/13/2021   Noninflammatory disorder of vagina 05/13/2021   Obstructive sleep apnea syndrome 05/13/2021   Osteoarthritis of knee 05/13/2021   Other long term (current) drug therapy 05/13/2021   Pharyngeal dysphagia 05/13/2021   Sciatica 05/13/2021   Sjogren's syndrome (HCC) 05/13/2021   Sleep disorder 05/13/2021   Status post hip replacement 05/13/2021   Superficial phlebitis 05/13/2021   Tear film insufficiency 05/13/2021   Urinary tract infectious disease 05/13/2021   Urticaria 05/13/2021   Ventral hernia without obstruction or gangrene 05/13/2021   Vitamin D deficiency 05/13/2021   H/O total hip arthroplasty, right  03/30/2021   Osteoarthritis of right hip 03/25/2021   Orthostatic hypotension 02/27/2021   IBS (irritable bowel syndrome) 02/11/2021   Hyponatremia 02/11/2021   Acute cystitis without hematuria    Chronic back pain    E-coli UTI 02/01/2021   Dehydration with hyponatremia 02/01/2021   Chronic heart failure with preserved ejection fraction (HCC) 02/01/2021   Hypertension associated with diabetes (HCC) 01/21/2021   Hyperlipidemia associated with type 2 diabetes mellitus (HCC) 01/21/2021   Aortic atherosclerosis (HCC) 01/21/2021   Chronic low back pain 10/05/2019   Type 2 diabetes mellitus with hyperglycemia, without long-term current use of insulin (HCC) 02/21/2019   Hypothyroidism 02/21/2019   Pain of left hip joint 12/08/2018   Dermatochalasis of both eyelids 07/04/2018   Viral labyrinthitis 01/13/2017   Acquired spondylolisthesis 04/30/2014   Degeneration of lumbar intervertebral disc 04/30/2014   Osteopenia 11/22/2013   Labial fusion        Phylliss Blakes, MD [place] [facility]  See AMION to contact appropriate on-call provider

## 2024-01-29 NOTE — ED Notes (Signed)
 Patient's son, Rosalynn Sergent, has requested that his sister (patient's daughter), Dr. Jola Baptist, not be given any information on patient's medical status or updates.

## 2024-01-29 NOTE — ED Notes (Signed)
 Pt took a long time to swallow meds and water, coughed a lot then vomited. MD notified, RN to keep pt NPO until swallow eval can be completed by SLP.

## 2024-01-29 NOTE — ED Notes (Signed)
 Lab called to add on labs

## 2024-01-29 NOTE — ED Provider Notes (Signed)
 Emergency Department Provider Note   I have reviewed the triage vital signs and the nursing notes.   HISTORY  Chief Complaint Flank Pain   HPI Christina Sellers is a 83 y.o. female past history reviewed below presents to the emergency department with pain after a fall today.  She lives at home with a 24-hour caregiver.  Her son is at bedside to provide some additional history although was not present during the fall.  Patient does not recall the event specifically but by caregiver report she was ambulating to the bathroom when she fell striking her right side.  Patient tells me that most of her pain is on the right side and she feels like "bones are moving" and causing pain with movement. Denies head trauma but history is less reliable. EMS was called. Patient had two episodes of emesis PTA. No pain or nausea meds given.   Level 5 caveat: memory impairment    Past Medical History:  Diagnosis Date   Abnormal Pap smear of vagina    Allergic sinusitis    Anxiety    Atherosclerotic peripheral vascular disease (HCC)    Carotid artery stenosis    Cellulitis    COPD with asthma (HCC)    not an issue now   Depression    Diabetes mellitus type 2, uncomplicated (HCC)    DJD (degenerative joint disease)    Elevated cholesterol    Elevated MCV    secondary to alchol   GERD (gastroesophageal reflux disease)    not an issue now.   Gout    Hearing difficulty    bilateral hearing aids   History of recurrent UTIs    Hypertension    loss weight'off meds now"   Hypothyroidism    IBS (irritable bowel syndrome)    Insomnia    Labial fusion    Osteoarthritis of left knee    Osteopenia    Recurrent vaginitis    Sjogren's syndrome (HCC)    Syncope 10/12/2013   secondary to dehydration and possible hypoglycemia   Tendonitis    Thyroid disease    Transfusion history    age 70 "anemia"   Vitamin D deficiency 05/13/2021    Review of Systems  Constitutional: No  fever/chills Cardiovascular: Right side chest wall pain.  Respiratory: Denies shortness of breath. Gastrointestinal: Right flank/abdominal pain. Positive vomiting x 2 PTA.  Neurological: Negative for headaches.  ____________________________________________   PHYSICAL EXAM:  VITAL SIGNS: ED Triage Vitals  Encounter Vitals Group     BP 01/29/24 0315 (!) 165/56     Pulse Rate 01/29/24 0315 88     Resp 01/29/24 0315 (!) 22     Temp 01/29/24 0315 (!) 97.5 F (36.4 C)     Temp Source 01/29/24 0315 Oral     SpO2 01/29/24 0315 95 %   Constitutional: Alert. Well appearing and in no acute distress. Eyes: Conjunctivae are normal. PERRL. Head: Atraumatic. Nose: No congestion/rhinnorhea. Mouth/Throat: Mucous membranes are moist.  Oropharynx non-erythematous. Neck: No stridor.  No cervical spine tenderness to palpation. Cardiovascular: Normal rate, regular rhythm. Good peripheral circulation. Grossly normal heart sounds. Tenderness to palpation over the right chest wall without crepitus.  Respiratory: Normal respiratory effort.  No retractions. Lungs CTAB. Gastrointestinal: Soft with some mild right sided tenderness. No distention.  Musculoskeletal: Normal ROM of the bilateral hips.  Neurologic:  Normal speech and language. No gross focal neurologic deficits are appreciated.  Skin:  Skin is warm, dry and intact. No rash noted.  ____________________________________________   LABS (all labs ordered are listed, but only abnormal results are displayed)  Labs Reviewed  COMPREHENSIVE METABOLIC PANEL - Abnormal; Notable for the following components:      Result Value   Potassium 3.1 (*)    CO2 17 (*)    Glucose, Bld 167 (*)    BUN 40 (*)    Creatinine, Ser 2.31 (*)    GFR, Estimated 20 (*)    Anion gap 17 (*)    All other components within normal limits  CBC WITH DIFFERENTIAL/PLATELET - Abnormal; Notable for the following components:   WBC 13.3 (*)    Neutro Abs 11.0 (*)    Abs  Immature Granulocytes 0.11 (*)    All other components within normal limits  URINALYSIS, ROUTINE W REFLEX MICROSCOPIC - Abnormal; Notable for the following components:   Glucose, UA >=500 (*)    Protein, ur >=300 (*)    Bacteria, UA RARE (*)    All other components within normal limits  HEMOGLOBIN A1C - Abnormal; Notable for the following components:   Hgb A1c MFr Bld 6.1 (*)    All other components within normal limits  CBC - Abnormal; Notable for the following components:   RBC 3.20 (*)    Hemoglobin 10.1 (*)    HCT 30.3 (*)    All other components within normal limits  COMPREHENSIVE METABOLIC PANEL - Abnormal; Notable for the following components:   CO2 19 (*)    Glucose, Bld 112 (*)    BUN 27 (*)    Creatinine, Ser 1.84 (*)    Calcium 8.1 (*)    Total Protein 5.6 (*)    Albumin 3.2 (*)    AST 150 (*)    ALT 116 (*)    Alkaline Phosphatase 150 (*)    GFR, Estimated 27 (*)    All other components within normal limits  GLUCOSE, CAPILLARY - Abnormal; Notable for the following components:   Glucose-Capillary 132 (*)    All other components within normal limits  GLUCOSE, CAPILLARY - Abnormal; Notable for the following components:   Glucose-Capillary 108 (*)    All other components within normal limits  GLUCOSE, CAPILLARY - Abnormal; Notable for the following components:   Glucose-Capillary 160 (*)    All other components within normal limits  GLUCOSE, CAPILLARY - Abnormal; Notable for the following components:   Glucose-Capillary 138 (*)    All other components within normal limits  CBC - Abnormal; Notable for the following components:   RBC 3.56 (*)    Hemoglobin 11.3 (*)    HCT 34.0 (*)    All other components within normal limits  COMPREHENSIVE METABOLIC PANEL - Abnormal; Notable for the following components:   BUN 25 (*)    Creatinine, Ser 1.71 (*)    Total Protein 6.0 (*)    Albumin 3.2 (*)    AST 61 (*)    ALT 79 (*)    Alkaline Phosphatase 128 (*)    GFR,  Estimated 29 (*)    All other components within normal limits  GLUCOSE, CAPILLARY - Abnormal; Notable for the following components:   Glucose-Capillary 137 (*)    All other components within normal limits  GLUCOSE, CAPILLARY - Abnormal; Notable for the following components:   Glucose-Capillary 124 (*)    All other components within normal limits  GLUCOSE, CAPILLARY - Abnormal; Notable for the following components:   Glucose-Capillary 172 (*)    All other components within normal limits  GLUCOSE, CAPILLARY - Abnormal; Notable for the following components:   Glucose-Capillary 133 (*)    All other components within normal limits  COMPREHENSIVE METABOLIC PANEL - Abnormal; Notable for the following components:   Glucose, Bld 108 (*)    BUN 27 (*)    Creatinine, Ser 1.76 (*)    Total Protein 5.2 (*)    Albumin 2.7 (*)    ALT 46 (*)    GFR, Estimated 28 (*)    All other components within normal limits  CBC - Abnormal; Notable for the following components:   RBC 3.22 (*)    Hemoglobin 10.2 (*)    HCT 30.6 (*)    All other components within normal limits  GLUCOSE, CAPILLARY - Abnormal; Notable for the following components:   Glucose-Capillary 126 (*)    All other components within normal limits  GLUCOSE, CAPILLARY - Abnormal; Notable for the following components:   Glucose-Capillary 105 (*)    All other components within normal limits  GLUCOSE, CAPILLARY - Abnormal; Notable for the following components:   Glucose-Capillary 158 (*)    All other components within normal limits  CBG MONITORING, ED - Abnormal; Notable for the following components:   Glucose-Capillary 138 (*)    All other components within normal limits  LIPASE, BLOOD  CK  MAGNESIUM  PHOSPHORUS  CK  VITAMIN D 25 HYDROXY (VIT D DEFICIENCY, FRACTURES)  TSH  CK  MAGNESIUM  MAGNESIUM   ____________________________________________  EKG   EKG Interpretation Date/Time:  Sunday January 29 2024 03:19:33  EST Ventricular Rate:  86 PR Interval:  113 QRS Duration:  101 QT Interval:  477 QTC Calculation: 571 R Axis:   -48  Text Interpretation: Sinus rhythm Borderline short PR interval Left atrial enlargement LAD, consider left anterior fascicular block Abnormal R-wave progression, late transition Borderline repolarization abnormality Prolonged QT interval No significant change since prior 3/24 Confirmed by Meridee Score [phone]) on 01/30/2024 10:18:31 AM        ____________________________________________   PROCEDURES  Procedure(s) performed:   Procedures  CRITICAL CARE Performed by: Maia Plan Total critical care time: 35 minutes Critical care time was exclusive of separately billable procedures and treating other patients. Critical care was necessary to treat or prevent imminent or life-threatening deterioration. Critical care was time spent personally by me on the following activities: development of treatment plan with patient and/or surrogate as well as nursing, discussions with consultants, evaluation of patient's response to treatment, examination of patient, obtaining history from patient or surrogate, ordering and performing treatments and interventions, ordering and review of laboratory studies, ordering and review of radiographic studies, pulse oximetry and re-evaluation of patient's condition.  Alona Bene, MD Emergency Medicine  ____________________________________________   INITIAL IMPRESSION / ASSESSMENT AND PLAN / ED COURSE  Pertinent labs & imaging results that were available during my care of the patient were reviewed by me and considered in my medical decision making (see chart for details).   This patient is Presenting for Evaluation of fall, which does require a range of treatment options, and is a complaint that involves a high risk of morbidity and mortality.  The Differential Diagnoses include head injury, rib fractures, contusion, PNX, intra-abdominal  hemorrhage, etc.  Critical Interventions-    Medications  HYDROmorphone (DILAUDID) injection 0.5 mg (0.5 mg Intravenous Given 01/31/24 1804)  methocarbamol (ROBAXIN) tablet 250 mg (250 mg Oral Given 02/01/24 0917)  amLODipine (NORVASC) tablet 5 mg (5 mg Oral Given 02/01/24 0924)  escitalopram (LEXAPRO) tablet 10  mg (10 mg Oral Given 01/31/24 2159)  levothyroxine (SYNTHROID) tablet 75 mcg (75 mcg Oral Given 02/01/24 0534)  pantoprazole (PROTONIX) EC tablet 40 mg (40 mg Oral Not Given 02/01/24 0919)  cycloSPORINE (RESTASIS) 0.05 % ophthalmic emulsion 1 drop (1 drop Both Eyes Given 02/01/24 0916)  sucralfate (CARAFATE) tablet 1 g (1 g Oral Given 02/01/24 0917)  memantine (NAMENDA) tablet 5 mg (5 mg Oral Given 02/01/24 0918)  acetaminophen (TYLENOL) tablet 650 mg (650 mg Oral Given 02/01/24 0917)  ondansetron (ZOFRAN) tablet 4 mg (has no administration in time range)    Or  ondansetron (ZOFRAN) injection 4 mg (has no administration in time range)  0.9 %  sodium chloride infusion (0 mLs Intravenous Stopped 01/30/24 0505)  carvedilol (COREG) tablet 6.25 mg (6.25 mg Oral Given 02/01/24 0917)  feeding supplement (ENSURE ENLIVE / ENSURE PLUS) liquid 237 mL (237 mLs Oral Given 01/31/24 1430)  senna-docusate (Senokot-S) tablet 1 tablet (has no administration in time range)  oxyCODONE (Oxy IR/ROXICODONE) immediate release tablet 2.5 mg (has no administration in time range)  ondansetron (ZOFRAN) injection 4 mg (4 mg Intravenous Given 01/29/24 0350)  morphine (PF) 2 MG/ML injection 2 mg (2 mg Intravenous Given 01/29/24 0353)  sodium chloride 0.9 % bolus 500 mL (0 mLs Intravenous Stopped 01/29/24 0553)  morphine (PF) 2 MG/ML injection 2 mg (2 mg Intravenous Given 01/29/24 0804)  potassium chloride (KLOR-CON M) CR tablet 10 mEq (10 mEq Oral Given 01/29/24 1109)  potassium chloride 10 mEq in 100 mL IVPB (0 mEq Intravenous Stopped 01/29/24 1909)  LORazepam (ATIVAN) injection 1 mg (1 mg Intravenous Given 01/29/24 2155)   pantoprazole (PROTONIX) injection 40 mg (40 mg Intravenous Given 01/31/24 1452)  lactated ringers bolus 250 mL (0 mLs Intravenous Stopped 01/31/24 1956)    Reassessment after intervention:  pain well controlled.    I did obtain Additional Historical Information from EMS and son at bedside.   Clinical Laboratory Tests Ordered, included Creatinine and LFts elevated. No leukocytosis.   Radiologic Tests Ordered, included CT chest, abd, pelvis. . I independently interpreted the images and agree with radiology interpretation.   Cardiac Monitor Tracing which shows NSR.    Social Determinants of Health Risk lives at home with 24 hr caregiver.   Consult complete with Trauma surgery. Plan for consultation. No chest tube without SOB or hypoxemia.   TRH. Plan to admit to [facility].   Medical Decision Making: Summary:  Patient presents emergency department for evaluation of pain after what appears to have been a mechanical fall.  History is somewhat limited.  Patient does have some right-sided tenderness in the chest and abdomen.  Plan for CT imaging broadly given somewhat spotty history along with screening blood work. Low-dose morphine and zofran ordered for pain/nausea.   Reevaluation with update and discussion with patient and family. Discussed CT results. Plan for admit. They are in agreement.   Patient's presentation is most consistent with acute presentation with potential threat to life or bodily function.   Disposition: admit  ____________________________________________  FINAL CLINICAL IMPRESSION(S) / ED DIAGNOSES  Final diagnoses:  Fall, initial encounter  Closed fracture of multiple ribs of right side, initial encounter  Traumatic pneumothorax, initial encounter     Note:  This document was prepared using Dragon voice recognition software and may include unintentional dictation errors.  Alona Bene, MD, [facility] Emergency Medicine    Mavryk Pino, Arlyss Repress, MD 02/01/24 [id number]

## 2024-01-29 NOTE — Evaluation (Signed)
 Clinical/Bedside Swallow Evaluation Patient Details  Name: Christina Sellers MRN: [id number] Date of Birth: [birth year]  Today's Date: 01/29/2024 Time: SLP Start Time (ACUTE ONLY): 1630 SLP Stop Time (ACUTE ONLY): 1645 SLP Time Calculation (min) (ACUTE ONLY): 15 min  Past Medical History:  Past Medical History:  Diagnosis Date   Abnormal Pap smear of vagina    Allergic sinusitis    Anxiety    Atherosclerotic peripheral vascular disease (HCC)    Carotid artery stenosis    Cellulitis    COPD with asthma (HCC)    not an issue now   Depression    Diabetes mellitus type 2, uncomplicated (HCC)    DJD (degenerative joint disease)    Elevated cholesterol    Elevated MCV    secondary to alchol   GERD (gastroesophageal reflux disease)    not an issue now.   Gout    Hearing difficulty    bilateral hearing aids   History of recurrent UTIs    Hypertension    loss weight'off meds now"   Hypothyroidism    IBS (irritable bowel syndrome)    Insomnia    Labial fusion    Osteoarthritis of left knee    Osteopenia    Recurrent vaginitis    Sjogren's syndrome (HCC)    Syncope 10/12/2013   secondary to dehydration and possible hypoglycemia   Tendonitis    Thyroid disease    Transfusion history    age 83 "anemia"   Vitamin D deficiency 05/13/2021   Past Surgical History:  Past Surgical History:  Procedure Laterality Date   CATARACT EXTRACTION, BILATERAL Bilateral    CHOLECYSTECTOMY     open   COLONOSCOPY WITH PROPOFOL N/A 03/23/2016   Procedure: COLONOSCOPY WITH PROPOFOL;  Surgeon: Charolett Bumpers, MD;  Location: [facility] ENDOSCOPY;  Service: Endoscopy;  Laterality: N/A;   EYE SURGERY     FOOT SURGERY Left    retained hardware   KNEE SURGERY Left    scope    TONSILLECTOMY     TOTAL HIP ARTHROPLASTY Right 03/30/2021   Procedure: RIGHTTOTAL HIP ARTHROPLASTY ANTERIOR APPROACH;  Surgeon: Gean Birchwood, MD;  Location: [facility] ORS;  Service: Orthopedics;  Laterality: Right;   HPI:  Patient is an  83 y.o. female with PMH including but not limited to:CHF, GERD, dysphagia, CKD stage III, COPD, anxiety, MDD, memory impairment, hearing impairment (bilateral hearing aids), DM-2, DJD, IBS. She [facility] ED on 01/29/2024 after fall while ambulating to the bathroom resulting in her hitting her lower back. She denied hitting her head. She had two episodes of vomiting. SLP swallow evaluation ordered after RN reported patient took a prolonged amount of time swallowing medications with water, coughed a lot and then vomited.    Assessment / Plan / Recommendation  Clinical Impression  Patient did not present with any clinical s/s of dysphagia as per this BSE with PO's of thin liquids (water, juice) and puree solids (applesauce). Swallow initiation appeared timely and no overt s/s aspiration observed. Patient does have h/o GERD. Son reports that patient has difficulty with swallowing medications and it takes her a long time to do so. He told SLP that he was giving her medications to her quickly and this resulted in her vomiting them back up. Although patient has a h/o GERD, son told SLP that she was eating filet mignon and vegetables the previous night. SLP is recommending to restart patient on regular texture solids, thin liquids and agree with RN's recommendation to try medications  crushed in puree. SLP will f/u at least one time to ensure diet toleration but suspect that patient's swallow function is at her baseline. SLP Visit Diagnosis: Dysphagia, unspecified (R13.10)    Aspiration Risk  Mild aspiration risk    Diet Recommendation Regular;Thin liquid    Liquid Administration via: Cup;Straw Medication Administration: Crushed with puree Supervision: Patient able to self feed;Intermittent supervision to cue for compensatory strategies Compensations: Small sips/bites;Slow rate Postural Changes: Seated upright at 90 degrees;Remain upright for at least 30 minutes after po intake    Other   Recommendations Oral Care Recommendations: Oral care BID    Recommendations for follow up therapy are one component of a multi-disciplinary discharge planning process, led by the attending physician.  Recommendations may be updated based on patient status, additional functional criteria and insurance authorization.  Follow up Recommendations No SLP follow up      Assistance Recommended at Discharge    Functional Status Assessment Patient has had a recent decline in their functional status and demonstrates the ability to make significant improvements in function in a reasonable and predictable amount of time.  Frequency and Duration min 1 x/week  1 week       Prognosis Prognosis for improved oropharyngeal function: Good      Swallow Study   General Date of Onset: 01/29/24 HPI: Patient is an 83 y.o. female with PMH including but not limited to:CHF, GERD, dysphagia, CKD stage III, COPD, anxiety, MDD, memory impairment, hearing impairment (bilateral hearing aids), DM-2, DJD, IBS. She presented [facility] ED on 01/29/2024 after fall while ambulating to the bathroom resulting in her hitting her lower back. She denied hitting her head. She had two episodes of vomiting. SLP swallow evaluation ordered after RN reported patient took a prolonged amount of time swallowing medications with water, coughed a lot and then vomited. Type of Study: Bedside Swallow Evaluation Previous Swallow Assessment: none found Diet Prior to this Study: NPO Temperature Spikes Noted: No Respiratory Status: Room air History of Recent Intubation: No Behavior/Cognition: Alert;Cooperative;Pleasant mood Oral Cavity Assessment: Within Functional Limits Oral Care Completed by SLP: No Oral Cavity - Dentition: Adequate natural dentition Vision: Functional for self-feeding Self-Feeding Abilities: Able to feed self;Needs set up Patient Positioning: Upright in bed Baseline Vocal Quality: Normal Volitional Cough: Cognitively  unable to elicit Volitional Swallow: Unable to elicit    Oral/Motor/Sensory Function Overall Oral Motor/Sensory Function: Within functional limits   Ice Chips     Thin Liquid Thin Liquid: Within functional limits Presentation: Straw;Self Fed    Nectar Thick     Honey Thick     Puree Puree: Within functional limits Presentation: Spoon   Solid     Solid: Not tested     Angela Nevin, MA, CCC-SLP Speech Therapy

## 2024-01-29 NOTE — ED Triage Notes (Signed)
 Pt BIB [facility] from home. Pt fell and hit her lower back while ambulating to bathroom. Pt reports feeling bones move around. Denies LOC. Denies hitting head. Pt had 2 episodes of vomiting. No thinners. Hx CHF, osteoporosis.  140/58 100HR 91RA 98% on 2L  148CBG 97.2T

## 2024-01-29 NOTE — H&P (Signed)
 History and Physical    Patient: Christina Sellers ZOX:[id number] DOB: 1941-11-20 DOA: 01/29/2024 DOS: the patient was seen and examined on 01/29/2024 PCP: Thana Ates, MD  Patient coming from: Home  Chief Complaint:  Chief Complaint  Patient presents with   Flank Pain   HPI: Christina Sellers is a 83 y.o. female with medical history significant of abnormal Pap smear, allergic sinusitis, anxiety, peripheral vascular disease, carotid artery stenosis, cellulitis, COPD with asthma, depression, type 2 diabetes, DJD, hyperlipidemia macrocytosis due to alcohol abuse, GERD, gout, impaired hearing, history UTIs, hypertension, hypothyroidism, IBS, labial flexion, osteoarthritis of the left knee who had a fall around 0300 this morning at home while she was walking to the bathroom and hitting her right side of her chest on the fall.  She lives at with a 24-hour caregiver.  No headache, no LOC, back or abdominal pain at the moment.  No diarrhea, melena or hematochezia.  She gets occasionally constipated.  No flank pain, dysuria, frequency or hematuria.  Lab work: CBC showed a white count of 13.3, hemoglobin 14.4 g/dL platelets 811.  Lipase 32 units/L.  Total CK, magnesium and phosphorus were normal.  CMP showed a potassium of 3.1 and CO2 of 17 mmol/L with an anion gap of 17.  Glucose under 67, BUN 40 and creatinine 2.31, the rest of the electrolytes and LFTs were normal.  Imaging: CT head without contrast with no acute intracranial abnormality.  CT cervical spine with no acute traumatic injury.  Stable cervical spine degeneration.  Bulky calcified cervical carotid atherosclerosis.  CT abdomen/pelvis small to moderate right pneumothorax with patchy airspace opacity in the dependent right lung base, consistent with atelectasis.  Acute fractures of the right 7 through 9 ribs.  No evidence of acute traumatic injury in the abdomen or pelvis.  8 to 9 mm low-density lesion in the pancreatic tail approaches water  density.  MRI with and without contrast in 6 months recommended.  Left colonic diverticulosis without diverticulitis.  Avascular necrosis of the left femoral head changes.  Aortic atherosclerosis.  Portable 1 view chest radiograph showing right apical pneumothorax.  Lower right rib cage.   ED course: Initial vital signs were temperature 97.5 F, pulse 88, respiration 22, BP 165/156 mmHg and O2 sat 95% on room air.  Morphine 2 mg IVP x 1, ondansetron 4 mg IVP x 1, KCl 10 mEq p.o. x 1 and normal saline 500 mL bolus.  Review of Systems: As mentioned in the history of present illness. All other systems reviewed and are negative. Past Medical History:  Diagnosis Date   Abnormal Pap smear of vagina    Allergic sinusitis    Anxiety    Atherosclerotic peripheral vascular disease (HCC)    Carotid artery stenosis    Cellulitis    COPD with asthma (HCC)    not an issue now   Depression    Diabetes mellitus type 2, uncomplicated (HCC)    DJD (degenerative joint disease)    Elevated cholesterol    Elevated MCV    secondary to alchol   GERD (gastroesophageal reflux disease)    not an issue now.   Gout    Hearing difficulty    bilateral hearing aids   History of recurrent UTIs    Hypertension    loss weight'off meds now"   Hypothyroidism    IBS (irritable bowel syndrome)    Insomnia    Labial fusion    Osteoarthritis of left knee    Osteopenia  Recurrent vaginitis    Sjogren's syndrome (HCC)    Syncope 11.7.14   secondary to dehydration and possible hypoglycemia   Tendonitis    Thyroid disease    Transfusion history    age 1 "anemia"   Past Surgical History:  Procedure Laterality Date   CATARACT EXTRACTION, BILATERAL Bilateral    CHOLECYSTECTOMY     open   COLONOSCOPY WITH PROPOFOL N/A 03/23/2016   Procedure: COLONOSCOPY WITH PROPOFOL;  Surgeon: Charolett Bumpers, MD;  Location: [facility];  Service: Endoscopy;  Laterality: N/A;   EYE SURGERY     FOOT SURGERY Left    retained  hardware   KNEE SURGERY Left    scope    TONSILLECTOMY     TOTAL HIP ARTHROPLASTY Right 03/30/2021   Procedure: RIGHTTOTAL HIP ARTHROPLASTY ANTERIOR APPROACH;  Surgeon: Gean Birchwood, MD;  Location: [facility];  Service: Orthopedics;  Laterality: Right;   Social History:  reports that she quit smoking about 41 years ago. Her smoking use included cigarettes. She has never used smokeless tobacco. She reports that she does not drink alcohol and does not use drugs.  Allergies  Allergen Reactions   Tape Other (See Comments)    SKIN IS VERY THIN- TAPE TEARS VERY EASILY!!   Celexa [Citalopram Hydrobromide] Other (See Comments)    Nightmares    Fluconazole Other (See Comments)    Unknown reaction   Green Tea (Camellia Sinensis)     Other Reaction(s): mouth ulcers   Green Tea Leaf Ext Other (See Comments)    Mouth ulcers   Hydralazine Hcl     Other Reaction(s): easy bruising   Kenalog [Triamcinolone] Other (See Comments)    Spinal injection caused sweats   Levaquin [Levofloxacin] Hives   Losartan Potassium-Hctz Other (See Comments)    Night sweats/ fatigue   Magnesium Citrate Other (See Comments)   Methocarbamol Other (See Comments)    500 mg is too much- can tolerate a lesser dose   Nitrofurantoin Macrocrystal Other (See Comments)    Possible vertigo   Scopolamine Other (See Comments)    Memory change   Sulfa Antibiotics Other (See Comments)    Reaction not recalled by the patient   Tea     Other reaction(s): Not available   Tramadol     Makes pt feel loopy   Trimethoprim Hives    Family History  Problem Relation Age of Onset   Diabetes Maternal Aunt    Cancer Father        Oral cancer   Breast cancer Sister        Age 98's   Cancer Brother        Lung cancer    Prior to Admission medications   Medication Sig Start Date End Date Taking? Authorizing Provider  acetaminophen (TYLENOL) 650 MG CR tablet Take 650-1,300 mg by mouth every 8 (eight) hours as needed for pain.   Yes  [provider]  amLODipine (NORVASC) 5 MG tablet Take 5 mg by mouth 2 (two) times daily.   Yes [provider]  denosumab (PROLIA) 60 MG/ML SOSY injection Inject 60 mg into the skin every 6 (six) months.   Yes [provider]  diclofenac sodium (VOLTAREN) 1 % GEL Apply 1 application topically 2 (two) times daily as needed (to painful sites). 03/29/19  Yes [provider]  empagliflozin (JARDIANCE) 10 MG TABS tablet Take 10 mg by mouth daily.   Yes [provider]  escitalopram (LEXAPRO) 10 MG tablet Take  10 mg by mouth at bedtime. 10/12/23  Yes [provider]  folic acid (FOLVITE) 1 MG tablet Take 1 tablet (1 mg total) by mouth daily. Patient taking differently: Take 1 mg by mouth at bedtime. 12/02/23 12/01/24 Yes Micki Riley, MD  furosemide (LASIX) 20 MG tablet Take 1 tablet (20 mg total) by mouth daily. 07/18/23  Yes Alver Sorrow, NP  levothyroxine (SYNTHROID) 75 MCG tablet Take 75 mcg by mouth every morning. 07/15/21  Yes [provider]  memantine (NAMENDA) 10 MG tablet Take 1 tablet (10 mg total) by mouth 2 (two) times daily. 11/29/23  Yes Micki Riley, MD  pantoprazole (PROTONIX) 40 MG tablet Take 40 mg by mouth 2 (two) times daily. 01/24/24  Yes [provider]  Polyvinyl Alcohol-Povidone PF (REFRESH) 1.4-0.6 % SOLN Place 1 Application into both eyes as needed (Dry eye).   Yes [provider]  RESTASIS 0.05 % ophthalmic emulsion Place 1 drop into both eyes 2 (two) times daily.   Yes [provider]  rosuvastatin (CRESTOR) 5 MG tablet Take 5 mg by mouth daily.   Yes [provider]  spironolactone (ALDACTONE) 25 MG tablet Take 1 tablet (25 mg total) by mouth daily. 07/06/23  Yes Chandrasekhar, Mahesh A, MD  sucralfate (CARAFATE) 1 g tablet Take 1 g by mouth 2 (two) times daily.   Yes [provider]    Physical Exam: Vitals:   01/29/24 0315 01/29/24 0500  BP: (!) 165/56 (!)  159/60  Pulse: 88 81  Resp: (!) 22 13  Temp: (!) 97.5 F (36.4 C)   TempSrc: Oral   SpO2: 95% 98%   Physical Exam Vitals and nursing note reviewed.  Constitutional:      General: She is awake. She is not in acute distress.    Appearance: Normal appearance. She is ill-appearing.     Interventions: Nasal cannula in place.  HENT:     Head: Normocephalic.     Nose: No rhinorrhea.     Mouth/Throat:     Mouth: Mucous membranes are dry.  Eyes:     General: No scleral icterus.    Pupils: Pupils are equal, round, and reactive to light.  Neck:     Vascular: No JVD.  Cardiovascular:     Rate and Rhythm: Normal rate and regular rhythm.     Heart sounds: S1 normal and S2 normal.  Pulmonary:     Effort: Pulmonary effort is normal.     Breath sounds: Examination of the right-lower field reveals decreased breath sounds. Examination of the left-lower field reveals decreased breath sounds. Decreased breath sounds present. No wheezing, rhonchi or rales.  Abdominal:     General: Bowel sounds are normal. There is no distension.     Palpations: Abdomen is soft.     Tenderness: There is no abdominal tenderness. There is no guarding.  Musculoskeletal:     Cervical back: Neck supple.     Right lower leg: No edema.     Left lower leg: No edema.  Skin:    General: Skin is warm and dry.  Neurological:     General: No focal deficit present.     Mental Status: She is alert and oriented to person, place, and time.  Psychiatric:        Mood and Affect: Mood normal.        Behavior: Behavior normal. Behavior is cooperative.     Data Reviewed:  Results are pending, will review when available.  Assessment and Plan: Principal Problem:   Fall With resulting:   Chest wall trauma With: Pneumothorax and multiple rib fractures. Observation/MedSurg. Continue IV fluids. Keep n.p.o. for now. Analgesics as needed. Antiemetics as needed. Pantoprazole 40 mg IVP daily. Follow CBC, CMP and lipase in  AM.  Active Problems:   Type 2 diabetes mellitus with hyperglycemia,  without long-term current use of insulin (HCC) Carbohydrate modified diet. CBG monitoring with RI SS. Check hemoglobin A1c.    Hypothyroidism Continue levothyroxine 75 mcg p.o. daily.    Chronic low back pain Analgesic or muscle relaxant as needed.    Aortic atherosclerosis (HCC)   Bilateral carotid artery stenosis Might benefit from therapy. Might benefit from lifestyle modifications. Follow-up with primary care provider. Follow-up with PCP or vascular as an outpatient.    Anxiety disorder Continue escitalopram 10 mg p.o. daily.    Chronic kidney disease, stage 3 unspecified (HCC) Monitor renal function electrolytes.    Chronic obstructive pulmonary disease, unspecified (HCC) Bronchodilators as needed.    Gastro-esophageal reflux disease without esophagitis On pantoprazole 40 mg p.o. twice daily.    Obstructive sleep apnea syndrome No CPAP at bedtime.    Paroxysmal SVT (supraventricular tachycardia) (HCC) Keep electrolytes optimized. Not on rate control meds.   Advance Care Planning:   Code Status: Full Code   Consults: General surgery/trauma surgery.  Family Communication: Her son was at bedside.  Severity of Illness: The appropriate patient status for this patient is INPATIENT. Inpatient status is judged to be reasonable and necessary in order to provide the required intensity of service to ensure the patient's safety. The patient's presenting symptoms, physical exam findings, and initial radiographic and laboratory data in the context of their chronic comorbidities is felt to place them at high risk for further clinical deterioration. Furthermore, it is not anticipated that the patient will be medically stable for discharge from the hospital within 2 midnights of admission.   * I certify that at the point of admission it is my clinical judgment that the patient will require inpatient hospital  care spanning beyond 2 midnights from the point of admission due to high intensity of service, high risk for further deterioration and high frequency of surveillance required.*  Author: Bobette Mo, MD 01/29/2024 8:03 AM  For on call review [email or web address].   This document was prepared using Dragon voice recognition software and may contain some unintended transcription errors.

## 2024-01-30 ENCOUNTER — Inpatient Hospital Stay (HOSPITAL_COMMUNITY): Payer: Medicare Other

## 2024-01-30 DIAGNOSIS — I6523 Occlusion and stenosis of bilateral carotid arteries: Secondary | ICD-10-CM

## 2024-01-30 DIAGNOSIS — S299XXA Unspecified injury of thorax, initial encounter: Secondary | ICD-10-CM | POA: Diagnosis not present

## 2024-01-30 DIAGNOSIS — N189 Chronic kidney disease, unspecified: Secondary | ICD-10-CM

## 2024-01-30 DIAGNOSIS — Z7189 Other specified counseling: Secondary | ICD-10-CM

## 2024-01-30 DIAGNOSIS — E1165 Type 2 diabetes mellitus with hyperglycemia: Secondary | ICD-10-CM

## 2024-01-30 DIAGNOSIS — W19XXXA Unspecified fall, initial encounter: Secondary | ICD-10-CM

## 2024-01-30 DIAGNOSIS — N179 Acute kidney failure, unspecified: Secondary | ICD-10-CM

## 2024-01-30 DIAGNOSIS — S2241XA Multiple fractures of ribs, right side, initial encounter for closed fracture: Secondary | ICD-10-CM

## 2024-01-30 LAB — URINALYSIS, ROUTINE W REFLEX MICROSCOPIC
Bilirubin Urine: NEGATIVE
Glucose, UA: >=500 mg/dL — AB
Hgb urine dipstick: NEGATIVE
Ketones, ur: NEGATIVE mg/dL
Leukocytes,Ua: NEGATIVE
Nitrite: NEGATIVE
Protein, ur: >=300 mg/dL — AB
Specific Gravity, Urine: 1.016 (ref 1.005–1.030)
pH: 6 (ref 5.0–8.0)

## 2024-01-30 LAB — CBC
HCT: 30.3 % — ABNORMAL LOW (ref 36.0–46.0)
Hemoglobin: 10.1 g/dL — ABNORMAL LOW (ref 12.0–15.0)
MCH: 31.6 pg (ref 26.0–34.0)
MCHC: 33.3 g/dL (ref 30.0–36.0)
MCV: 94.7 fL (ref 80.0–100.0)
Platelets: 154 10*3/uL (ref 150–400)
RBC: 3.2 MIL/uL — ABNORMAL LOW (ref 3.87–5.11)
RDW: 13.5 % (ref 11.5–15.5)
WBC: 7.4 10*3/uL (ref 4.0–10.5)
nRBC: 0 % (ref 0.0–0.2)

## 2024-01-30 LAB — COMPREHENSIVE METABOLIC PANEL
ALT: 116 U/L — ABNORMAL HIGH (ref 0–44)
AST: 150 U/L — ABNORMAL HIGH (ref 15–41)
Albumin: 3.2 g/dL — ABNORMAL LOW (ref 3.5–5.0)
Alkaline Phosphatase: 150 U/L — ABNORMAL HIGH (ref 38–126)
Anion gap: 9 (ref 5–15)
BUN: 27 mg/dL — ABNORMAL HIGH (ref 8–23)
CO2: 19 mmol/L — ABNORMAL LOW (ref 22–32)
Calcium: 8.1 mg/dL — ABNORMAL LOW (ref 8.9–10.3)
Chloride: 109 mmol/L (ref 98–111)
Creatinine, Ser: 1.84 mg/dL — ABNORMAL HIGH (ref 0.44–1.00)
GFR, Estimated: 27 mL/min — ABNORMAL LOW (ref >60)
Glucose, Bld: 112 mg/dL — ABNORMAL HIGH (ref 70–99)
Potassium: 3.9 mmol/L (ref 3.5–5.1)
Sodium: 137 mmol/L (ref 135–145)
Total Bilirubin: 0.8 mg/dL (ref 0.0–1.2)
Total Protein: 5.6 g/dL — ABNORMAL LOW (ref 6.5–8.1)

## 2024-01-30 LAB — GLUCOSE, CAPILLARY
Glucose-Capillary: 108 mg/dL — ABNORMAL HIGH (ref 70–99)
Glucose-Capillary: 160 mg/dL — ABNORMAL HIGH (ref 70–99)
Glucose-Capillary: 138 mg/dL — ABNORMAL HIGH (ref 70–99)
Glucose-Capillary: 137 mg/dL — ABNORMAL HIGH (ref 70–99)

## 2024-01-30 LAB — TSH: TSH: 3.081 u[IU]/mL (ref 0.350–4.500)

## 2024-01-30 LAB — CK: Total CK: 154 U/L (ref 38–234)

## 2024-01-30 LAB — VITAMIN D 25 HYDROXY (VIT D DEFICIENCY, FRACTURES): Vit D, 25-Hydroxy: 36.68 ng/mL (ref 30–100)

## 2024-01-30 NOTE — Progress Notes (Addendum)
 Subjective: Patient very hard of hearing.  Admits to mild SOB.  Some chest pain as expected.  Hasn't mobilized yet.  Voiding well with purewick.  Ate some yesterday and hungry this morning.  Housekeeper at the bedside.  ROS: See above, otherwise other systems negative  Objective: Vital signs in last 24 hours: Temp:  [97.8 F (36.6 C)-98.5 F (36.9 C)] 98.5 F (36.9 C) (02/24 0415) Pulse Rate:  [72-81] 80 (02/24 0415) Resp:  [11-19] 13 (02/24 0415) BP: (153-169)/(58-70) 156/58 (02/24 0415) SpO2:  [95 %-100 %] 95 % (02/24 0415) Weight:  [50.2 kg] 50.2 kg (02/23 2002)    Intake/Output from previous day: 02/23 0701 - 02/24 0700 In: 1135.8 [P.O.:50; I.V.:885.8; IV Piggyback:200] Out: 300 [Urine:300] Intake/Output this shift: No intake/output data recorded.  PE: Gen: NAD HEENT: PERRL.  Trachea midline.  [place] in place on 3L, says between 95-100% Heart: regular Lungs: CTAB, mild chest wall tenderness as expected Abd: soft, NT, ND GU: purewick in place, clear yellow urine Ext: MAEs with no deficits Psych: difficult to ask many questions, but is alert and seems oriented.  Lab Results:  Recent Labs    01/29/24 0328 01/30/24 0407  WBC 13.3* 7.4  HGB 14.4 10.1*  HCT 44.5 30.3*  PLT 202 154   BMET Recent Labs    01/29/24 0328 01/30/24 0407  NA 135 137  K 3.1* 3.9  CL 101 109  CO2 17* 19*  GLUCOSE 167* 112*  BUN 40* 27*  CREATININE 2.31* 1.84*  CALCIUM 9.7 8.1*   PT/INR No results for input(s): "LABPROT", "INR" in the last 72 hours. CMP     Component Value Date/Time   NA 137 01/30/2024 0407   NA 137 07/15/2023 1321   K 3.9 01/30/2024 0407   CL 109 01/30/2024 0407   CO2 19 (L) 01/30/2024 0407   GLUCOSE 112 (H) 01/30/2024 0407   BUN 27 (H) 01/30/2024 0407   BUN 23 07/15/2023 1321   CREATININE 1.84 (H) 01/30/2024 0407   CALCIUM 8.1 (L) 01/30/2024 0407   PROT 5.6 (L) 01/30/2024 0407   ALBUMIN 3.2 (L) 01/30/2024 0407   AST 150 (H) 01/30/2024 0407   ALT  116 (H) 01/30/2024 0407   ALKPHOS 150 (H) 01/30/2024 0407   BILITOT 0.8 01/30/2024 0407   GFRNONAA 27 (L) 01/30/2024 0407   GFRAA 55 (L) 10/12/2013 1945   Lipase     Component Value Date/Time   LIPASE 32 01/29/2024 0328       Studies/Results: DG CHEST PORT 1 VIEW Result Date: 01/30/2024 CLINICAL DATA:  83 year old female status post fall with right pneumothorax, right rib fractures. EXAM: PORTABLE CHEST 1 VIEW COMPARISON:  Portable chest 01/29/2024 and earlier. FINDINGS: Portable AP semi upright view at 0544 hours. Small right apical pneumothorax persists. Lower lung volumes, increased confluent right lung base opacity. Stable underlying cardiomegaly. Left lung appears stable. Stable cholecystectomy clips. Nonobstructed visible bowel gas pattern. Right rib fractures redemonstrated. Stable visualized osseous structures. IMPRESSION: 1. Persistent small right apical pneumothorax. Increasing right lung base opacity which could be increasing atelectasis or small pleural effusion. 2. Right rib fractures. Stable cardiomegaly. Left lung appears negative. Electronically Signed   By: Odessa Fleming M.D.   On: 01/30/2024 06:01   CT CHEST ABDOMEN PELVIS WO CONTRAST Result Date: 01/29/2024 CLINICAL DATA:  Patient fell wall walking.  Blunt poly trauma. EXAM: CT CHEST, ABDOMEN AND PELVIS WITHOUT CONTRAST TECHNIQUE: Multidetector CT imaging of the chest, abdomen and pelvis was performed  following the standard protocol without IV contrast. RADIATION DOSE REDUCTION: This exam was performed according to the departmental dose-optimization program which includes automated exposure control, adjustment of the mA and/or kV according to patient size and/or use of iterative reconstruction technique. COMPARISON:  Abdomen and pelvis CT 06/15/2023 FINDINGS: CT CHEST FINDINGS Cardiovascular: The heart size is normal. No substantial pericardial effusion. Coronary artery calcification is evident. Mild atherosclerotic calcification is  noted in the wall of the thoracic aorta. Mediastinum/Nodes: No mediastinal lymphadenopathy. No evidence for gross hilar lymphadenopathy although assessment is limited by the lack of intravenous contrast on the current study. Tiny hiatal hernia. The esophagus has normal imaging features. There is no axillary lymphadenopathy. Lungs/Pleura: Small to moderate right pneumothorax noted with patchy airspace opacity in the dependent right lung base, consistent with atelectasis. There is trace atelectasis dependently in the left base. No substantial pleural effusion. Musculoskeletal: Acute fractures noted lateral right seventh and eighth ribs with 1 shaft with of displacement of the lateral right eighth rib fracture. There is a second fracture in the paraspinal right eighth rib and an acute fracture is noted in the posterior right ninth rib. No evidence for an acute fracture involving the thoracic spine. CT ABDOMEN PELVIS FINDINGS Hepatobiliary: No suspicious focal abnormality in the liver on this study without intravenous contrast. Cholecystectomy. No intrahepatic or extrahepatic biliary dilation. Pancreas: Pancreatic parenchyma is diffusely atrophic. 8-9 mm low-density lesion identified in the pancreatic tail on image 50 of series 34. This lesion approaches water density. Spleen: No splenomegaly. No suspicious focal mass lesion. Adrenals/Urinary Tract: No adrenal nodule or mass. Kidneys unremarkable. No evidence for hydroureter. The urinary bladder appears normal for the degree of distention. Stomach/Bowel: Tiny hiatal hernia. Stomach otherwise unremarkable. Duodenum is normally positioned as is the ligament of Treitz. No small bowel wall thickening. No small bowel dilatation. The terminal ileum is normal. The appendix is normal. No gross colonic mass. No colonic wall thickening. Diverticular changes are noted in the left colon without evidence of diverticulitis. Moderate stool volume. Vascular/Lymphatic: There is advanced  atherosclerotic calcification of the abdominal aorta without aneurysm. There is no gastrohepatic or hepatoduodenal ligament lymphadenopathy. No retroperitoneal or mesenteric lymphadenopathy. No pelvic sidewall lymphadenopathy. Reproductive: The uterus is unremarkable.  There is no adnexal mass. Other: No intraperitoneal free fluid. Musculoskeletal: No evidence for lumbar spine fracture. No sacral fracture. Superior and inferior pubic rami are intact. Status post right hip replacement. Changes of avascular necrosis noted left femoral head. IMPRESSION: 1. Small to moderate right pneumothorax with patchy airspace opacity in the dependent right lung base, consistent with atelectasis. 2. Acute fractures of the right seventh through ninth ribs. 3. No evidence for acute traumatic injury in the abdomen or pelvis. 4. 8-9 mm low-density lesion in the pancreatic tail approaches water density. Follow-up MRI abdomen without and with contrast recommended in 6 months to reassess and to establish baseline for follow-up imaging recommendations. 5. Left colonic diverticulosis without diverticulitis. 6. Changes of avascular necrosis left femoral head. 7.  Aortic Atherosclerosis (ICD10-I70.0). I called the findings of right-sided pneumothorax and right rib fractures directly to Dr. Jacqulyn Bath in the emergency department at approximately 0702 hours. Electronically Signed   By: Kennith Center M.D.   On: 01/29/2024 09:14   DG Chest Port 1 View Result Date: 01/29/2024 CLINICAL DATA:  Known rib fracture.  Severe pain. EXAM: PORTABLE CHEST 1 VIEW COMPARISON:  06/21/2023.  CT scan earlier same day. FINDINGS: Right apical pneumothorax evident. The cardio pericardial silhouette is enlarged. Interstitial markings  are diffusely coarsened with chronic features. Bones are diffusely demineralized. Lower right rib fracture noted. IMPRESSION: 1. Right apical pneumothorax. 2. Lower right rib fracture. Electronically Signed   By: Kennith Center M.D.   On:  01/29/2024 08:20   CT Cervical Spine Wo Contrast Result Date: 01/29/2024 CLINICAL DATA:  83 year old female status post fall while walking to bathroom. EXAM: CT CERVICAL SPINE WITHOUT CONTRAST TECHNIQUE: Multidetector CT imaging of the cervical spine was performed without intravenous contrast. Multiplanar CT image reconstructions were also generated. RADIATION DOSE REDUCTION: This exam was performed according to the departmental dose-optimization program which includes automated exposure control, adjustment of the mA and/or kV according to patient size and/or use of iterative reconstruction technique. COMPARISON:  Cervical spine CT 09/12/2022. FINDINGS: Alignment: Stable cervical lordosis since 2023. Stable mild degenerative chronic anterolisthesis at C7-T1. Maintained posterior element alignment. Skull base and vertebrae: Bone mineralization is within normal limits for age. Visualized skull base is intact. No atlanto-occipital dissociation. C1 and C2 appear intact and aligned. No acute osseous abnormality identified. Soft tissues and spinal canal: No prevertebral fluid or swelling. No visible canal hematoma. Bulky calcified cervical carotid atherosclerosis but otherwise negative visible noncontrast neck soft tissues. Disc levels:  Stable mild for age cervical spine degeneration. Upper chest: Visible upper thoracic levels appear grossly intact. Mild apical lung scarring. IMPRESSION: 1. No acute traumatic injury identified in the cervical spine. 2. Stable cervical spine degeneration, generally mild for age. 3. Bulky calcified cervical carotid atherosclerosis. Electronically Signed   By: Odessa Fleming M.D.   On: 01/29/2024 07:02   CT Head Wo Contrast Result Date: 01/29/2024 CLINICAL DATA:  83 year old female status post fall while walking to bathroom. EXAM: CT HEAD WITHOUT CONTRAST TECHNIQUE: Contiguous axial images were obtained from the base of the skull through the vertex without intravenous contrast. RADIATION  DOSE REDUCTION: This exam was performed according to the departmental dose-optimization program which includes automated exposure control, adjustment of the mA and/or kV according to patient size and/or use of iterative reconstruction technique. COMPARISON:  Brain MRI 10/03/2023.  Head CT 08/03/2023. FINDINGS: Brain: Stable cerebral volume. No midline shift, ventriculomegaly, mass effect, evidence of mass lesion, intracranial hemorrhage or evidence of cortically based acute infarction. Patchy and confluent chronic cerebral white matter hypodensity appears stable. Stable gray-white matter differentiation throughout the brain. Stable punctate dystrophic calcifications in the posterior hemispheres. Vascular: Calcified atherosclerosis at the skull base. No suspicious intracranial vascular hyperdensity. Skull: Stable.  No acute osseous abnormality identified. Sinuses/Orbits: Visualized paranasal sinuses and mastoids are stable and well aerated. Other: No acute orbit or scalp soft tissue injury identified. IMPRESSION: 1. No acute intracranial abnormality or acute traumatic injury identified. 2. Stable non contrast CT appearance of chronic white matter disease. Electronically Signed   By: Odessa Fleming M.D.   On: 01/29/2024 07:00    Anti-infectives: Anti-infectives (From admission, onward)    None        Assessment/Plan Fall Right 7-9 rib fxs with apical PTX - repeat CXR today shows overall stability, RLL opacity c/w atelectasis.  Mobilize, aggressive pulm toilet, IS, pain control.  Repeat CXR one more day  9mm pancreatic tail lesion - outpatient MRI per PCP in 6 months Elevation of LFTs - unclear etiology.  Was normal yesterday.  No hepatic injury noted on re-review of her CT scan.  CMET in am to follow. AKI - per medicine Anemia - monitor.  10, appears close to baseline from other lab values previously Other medical problems per primary service.  Discussed plans with primary service at the bedside.   FEN -  HH diet VTE - per primary service, may have from our standpoint ID - none currently needed  I reviewed hospitalist notes, last 24 h vitals and pain scores, last 48 h intake and output, last 24 h labs and trends, and last 24 h imaging results.   LOS: 1 day    Letha Cape , [facility] Surgery 01/30/2024, 9:54 AM Please see Amion for pager number during day hours 7:00am-4:30pm or 7:00am -11:30am on weekends

## 2024-01-30 NOTE — Progress Notes (Signed)
 Patient agitated. Oriented to place and situation when asked. Patient constantly removes nasal cannula, trying to get out of bed and refused to take bedtime oral medications. Reports mild pain on right chest. Refused meds. Daughter at bedside. Dr made aware. IV Ativan 1mg  given. Pt then settled and slept

## 2024-01-30 NOTE — Progress Notes (Signed)
 Speech Language Pathology Treatment: Dysphagia  Patient Details Name: Kambrey Hagger MRN: [id number] DOB: [birth year] Today's Date: 01/30/2024 Time: 4098-1191 SLP Time Calculation (min) (ACUTE ONLY): 8 min  Assessment / Plan / Recommendation Clinical Impression  Pt seen for follow up after swallow assessment yesterday visitor/family member reported she consumed her breakfast this morning without difficulty. She has a history of difficulty swallowing meds per assessment yesterday (with water) and family member/visitor also stated she has a sensitive stomach and if doesn't have anything in stomach and tries to take meds she will typically vomit. She consumed regular texture, thin liquids without delays in mastication or residue and there were no s/s aspiration with sips water via straw. Recommend she continue with regular texture, thin liquids and continue to crush meds (family member/visitor) stated this has helped. No further ST is needed at this time and pt family/visitor in agreement.    HPI HPI: Patient is an 83 y.o. female with PMH including but not limited to:CHF, GERD, dysphagia, CKD stage III, COPD, anxiety, MDD, memory impairment, hearing impairment (bilateral hearing aids), DM-2, DJD, IBS. She presented to the [facility] ED on 01/29/2024 after fall while ambulating to the bathroom resulting in her hitting her lower back. She denied hitting her head. She had two episodes of vomiting. SLP swallow evaluation ordered after RN reported patient took a prolonged amount of time swallowing medications with water, coughed a lot and then vomited.      SLP Plan  All goals met;Consult other service (comment)      Recommendations for follow up therapy are one component of a multi-disciplinary discharge planning process, led by the attending physician.  Recommendations may be updated based on patient status, additional functional criteria and insurance authorization.    Recommendations  Diet  recommendations: Regular;Thin liquid Liquids provided via: Straw;Cup Medication Administration: Crushed with puree Supervision: Patient able to self feed Compensations: Small sips/bites;Slow rate Postural Changes and/or Swallow Maneuvers: Seated upright 90 degrees                  Oral care BID   None Dysphagia, unspecified (R13.10)     All goals met;Consult other service (comment)     Royce Macadamia  01/30/2024, 11:29 AM

## 2024-01-30 NOTE — Progress Notes (Signed)
 The son Genevie Cheshire had asked to speak with the manager of the unit around 1145 to discuss medical POA and only him and his sister [name] be given any medical information.  At this time he stated that him and [name] are POA's of his mother-I have asked him to provide me with the document, he state he would email to me today. He stated that his other sister Cathlean Cower is coming into town and they don't want medical information shared with her. He stated those were his mother's wishes prior to her getting confused and she did not have POA like him and his sister. He tried to discuss who had financial POA and I told him that did not concern [place] on medical POA. At that time I told him I with share this with Dr. Alanda Slim. At 1630 Verlon Au came to the desk asking to be placed as a contact along with her siblings. I shared what information Genevie Cheshire shared with me about medical POA. Verlon Au stated this was not true. I then called Genevie Cheshire back and he shared that the only Medical POA he had from 2022 had all 3 children listed on it (he did not provide me with a copy). I made him aware that if that was the only medical POA we could not keep information from his sister with the patient having dementia/confusion it could not be changed at this time. [name] his sister was on the phone at this time and stated understanding as well. They were not happy but understood. I made Verlon Au aware of the conversation with Genevie Cheshire and [name]. I told her I would make sure she was on the contact list and make sure Dr. Alanda Slim was updated. At 1715 Verlon Au called me demanding a copy of the HIPAA form with her (Leslie's) name on it and a print out of the contact list. I made her aware that I did not have a signed HIPAA form for this hospitalization and with her mother being confused I could not have her sign one. She stated her mother was not confused and stated that the patient wanted to speak with Dr. Alanda Slim. I made Dr. Alanda Slim aware. He stated he called the room and spoke with  Verlon Au and she stated she had no questions, but her mother did. She put her mom on the phone and Dr. Alanda Slim asked her did she have any questions and she stated no. Emelda Brothers BSN, Charity fundraiser, Facilities manager

## 2024-01-30 NOTE — Progress Notes (Addendum)
 PROGRESS NOTE  Christina Sellers ZOX:[id number] DOB: 21-Oct-1941   PCP: Thana Ates, MD  Patient is from: Home.  Independently ambulates at baseline.  Has 24/7 home care.  DOA: 01/29/2024 LOS: 1  Chief complaints Chief Complaint  Patient presents with   Flank Pain     Brief Narrative / Interim history: 83 year old F with PMH of dementia, osteoporosis, COPD/asthma, DM-2, PVD, carotid artery stenosis, gout, osteoarthritis, DJD, HTN, hypothyroidism and diminished hearing brought to ED by EMS after she fell and hit her lower back on right side while ambulating to the bathroom, and admitted with chest wall trauma with multiple ribs fractures (7th through 9th) and small traumatic right-sided pneumothorax.    In ED, she was hypertensive.  Other vital stable.  WBC 13.3.  CK normal.  K3.1.  CO2 17.  AG 17.  Glucose 67. Cr 2.31 (baseline 1-1.2).  Otherwise CMP and CBC without significant finding.  CT head and CT cervical spine without acute finding but stable degenerative disease, bulky calcified cervical carotid artery sclerosis.  CT chest/abdomen/pelvis showed moderate right pneumothorax with patchy airspace opacity in the dependent right lung base and acute fracture of right 7th through 9th ribs, 8 to 9 mm low-density water density lesion in pancreatic tail, left femoral AVN and aortic atherosclerosis.    Trauma surgery consulted and following.  Subsequent x-ray with improved pneumothorax.   Subjective: Seen and examined earlier this morning.  Patient had an episode of agitation/confusion for which she received Ativan last night.  She is awake and alert this morning.  She is oriented to self, city and state but she thinks she is at [employer] shop.  She is not oriented to time.  She reports right-sided chest pain.  She rates her pain 6/10.  Caregiver at bedside.  Objective: Vitals:   01/29/24 2002 01/29/24 2006 01/30/24 0415 01/30/24 0750  BP:  (!) 161/63 (!) 156/58   Pulse:   80   Resp:  12 13    Temp:   98.5 F (36.9 C)   TempSrc:   Axillary Oral  SpO2:  96% 95%   Weight: 50.2 kg     Height: 5' (1.524 m)       Examination:  GENERAL: Appears frail.  No apparent distress. HEENT: MMM.  Vision grossly intact.  Diminished hearing but better with his hearing aid. NECK: Supple.  No apparent JVD.  RESP:  No IWOB.  Fair aeration bilaterally. CVS:  RRR. Heart sounds normal.  ABD/GI/GU: BS+. Abd soft, NTND.  MSK/EXT:  Moves extremities. No apparent deformity. No edema.  SKIN: Small bruise on right leg. NEURO: Awake and alert.  Oriented to self, person, city and state but not time.  She thinks she is at beauty shop.  No apparent focal neuro deficit. PSYCH: Calm. Normal affect.   Procedures:  None  Microbiology summarized: None  Assessment and plan: Accidental fall at home: [place] while walking to the bathroom.  Patient denies prodromes but not a great historian Chest wall trauma due to fall at home Multiple rib fractures with right-sided pneumothorax due to fall at home -Imaging showed right 7th through 9th rib fracture with atelectasis. -Subsequent x-ray with improved pneumothorax. -Appreciate help by trauma surgery-Mobilize, aggressive pulm toilet, IS, pain control. Repeat CXR in the morning -Scheduled Tylenol with as needed oxycodone and Dilaudid for pain control -Fall precaution, PT/OT  AKI on CKD-3A: Baseline Cr 1.1-1.3.  Patient with dementia or Lasix, Aldactone and Jardiance which could increase risk of dehydration.  No  obstruction noted on CT.  CK normal.  AKI improving Recent Labs    02/28/23 1510 03/21/23 1137 04/14/23 0412 04/25/23 1226 05/01/23 0442 06/30/23 1142 07/15/23 1321 01/29/24 0328 01/30/24 0407  BUN 37* 33* 49* 40* 35* 19 23 40* 27*  CREATININE 1.38* 1.19* 1.26* 1.18* 1.19* 1.08* 1.29* 2.31* 1.84*  -History intake and output -Hold nephrotoxic meds including Lasix, Aldactone and Jardiance -Continue monitoring  NIDDM-2 with hyperglycemia and  hyperlipidemia: A1c 6.1%.  On Jardiance and Lipitor at home. Recent Labs  Lab 01/29/24 1207 01/29/24 2105 01/30/24 0756  GLUCAP 138* 132* 108*  -Hold home Jardiance due to risk of dehydration -Hold Lipitor due to elevated liver enzymes  Dementia with behavioral disturbance: Had an episode of agitation/confusion overnight.  Improved with Ativan.  Currently awake and alert but only oriented to self, city and state but not time.  She thinks she is at beauty shop.  CT head without acute finding.  No focal neurodeficit on exam. -Continue home memantine -Reorientation and delirium precaution  Elevated liver enzymes: Pattern consistent with rhabdo alcohol but CK within normal.  Does not drink alcohol.  She is on Lipitor at home. -Continue monitoring closely.  Will stop Tylenol if it continues to rise -Hold statin   Essential hypertension: BP slightly elevated partly due to pain. -Pain control as above -Continue home amlodipine -Hold diuretics.  Chronic back pain/osteoarthritis/degenerative disc disease -Pain control and PT/OT   Hypothyroidism -Continue home Synthroid -Check TSH  Bilateral carotid artery atherosclerosis Aortic atherosclerosis -Continue statin.   Mood disorder: Stable. -Continue home Lexapro  GERD -Continue home Protonix  History of osteoporosis: Seems like she is on Prolia injection every 6 months.  Pancreatic lesion: CT abdomen and pelvis showed 8 to 9 mm water density pancreatic tail lesion.  -Follow-up MRI abdomen in 6 months recommended   Body mass index is 21.61 kg/m.          DVT prophylaxis:  SCDs Start: 01/29/24 1308  Code Status: Full code. Family Communication: None at bedside. Level of care: Progressive Status is: Inpatient Remains inpatient appropriate because: Fall at home, multiple rib fractures and pneumothorax   Final disposition: To be determined Consultants:  Trauma surgery  55 minutes with more than 50% spent in reviewing  records, counseling patient/family and coordinating care.   Sch Meds:  Scheduled Meds:  acetaminophen  650 mg Oral Q6H   amLODipine  5 mg Oral BID   cycloSPORINE  1 drop Both Eyes BID   escitalopram  10 mg Oral QHS   levothyroxine  75 mcg Oral q morning   memantine  5 mg Oral BID   pantoprazole  40 mg Oral BID   sucralfate  1 g Oral BID   Continuous Infusions: PRN Meds:.HYDROmorphone (DILAUDID) injection, methocarbamol, ondansetron **OR** ondansetron (ZOFRAN) IV, oxyCODONE  Antimicrobials: Anti-infectives (From admission, onward)    None        I have personally reviewed the following labs and images: CBC: Recent Labs  Lab 01/29/24 0328 01/30/24 0407  WBC 13.3* 7.4  NEUTROABS 11.0*  --   HGB 14.4 10.1*  HCT 44.5 30.3*  MCV 95.7 94.7  PLT 202 154   BMP &GFR Recent Labs  Lab 01/29/24 0328 01/30/24 0407  NA 135 137  K 3.1* 3.9  CL 101 109  CO2 17* 19*  GLUCOSE 167* 112*  BUN 40* 27*  CREATININE 2.31* 1.84*  CALCIUM 9.7 8.1*  MG 2.1  --   PHOS 3.3  --  Estimated Creatinine Clearance: 16.3 mL/min (A) (by C-G formula based on SCr of 1.84 mg/dL (H)). Liver & Pancreas: Recent Labs  Lab 01/29/24 0328 01/30/24 0407  AST 26 150*  ALT 15 116*  ALKPHOS 89 150*  BILITOT 1.2 0.8  PROT 8.1 5.6*  ALBUMIN 4.8 3.2*   Recent Labs  Lab 01/29/24 0328  LIPASE 32   No results for input(s): "AMMONIA" in the last 168 hours. Diabetic: Recent Labs    01/29/24 2048  HGBA1C 6.1*   Recent Labs  Lab 01/29/24 1207 01/29/24 2105 01/30/24 0756  GLUCAP 138* 132* 108*   Cardiac Enzymes: Recent Labs  Lab 01/29/24 0328 01/30/24 0404  CKTOTAL 77 154   Recent Labs    06/30/23 1142  PROBNP 1,397*   Coagulation Profile: No results for input(s): "INR", "PROTIME" in the last 168 hours. Thyroid Function Tests: No results for input(s): "TSH", "T4TOTAL", "FREET4", "T3FREE", "THYROIDAB" in the last 72 hours. Lipid Profile: No results for input(s): "CHOL",  "HDL", "LDLCALC", "TRIG", "CHOLHDL", "LDLDIRECT" in the last 72 hours. Anemia Panel: No results for input(s): "VITAMINB12", "FOLATE", "FERRITIN", "TIBC", "IRON", "RETICCTPCT" in the last 72 hours. Urine analysis:    Component Value Date/Time   COLORURINE YELLOW 01/29/2024 0312   APPEARANCEUR CLEAR 01/29/2024 0312   LABSPEC 1.016 01/29/2024 0312   PHURINE 6.0 01/29/2024 0312   GLUCOSEU >=500 (A) 01/29/2024 0312   HGBUR NEGATIVE 01/29/2024 0312   BILIRUBINUR NEGATIVE 01/29/2024 0312   KETONESUR NEGATIVE 01/29/2024 0312   PROTEINUR >=300 (A) 01/29/2024 0312   UROBILINOGEN 0.2 01/22/2015 1100   NITRITE NEGATIVE 01/29/2024 0312   LEUKOCYTESUR NEGATIVE 01/29/2024 0312   Sepsis Labs: Invalid input(s): "PROCALCITONIN", "LACTICIDVEN"  Microbiology: No results found for this or any previous visit (from the past 240 hours).  Radiology Studies: DG CHEST PORT 1 VIEW Result Date: 01/30/2024 CLINICAL DATA:  83 year old female status post fall with right pneumothorax, right rib fractures. EXAM: PORTABLE CHEST 1 VIEW COMPARISON:  Portable chest 01/29/2024 and earlier. FINDINGS: Portable AP semi upright view at 0544 hours. Small right apical pneumothorax persists. Lower lung volumes, increased confluent right lung base opacity. Stable underlying cardiomegaly. Left lung appears stable. Stable cholecystectomy clips. Nonobstructed visible bowel gas pattern. Right rib fractures redemonstrated. Stable visualized osseous structures. IMPRESSION: 1. Persistent small right apical pneumothorax. Increasing right lung base opacity which could be increasing atelectasis or small pleural effusion. 2. Right rib fractures. Stable cardiomegaly. Left lung appears negative. Electronically Signed   By: Odessa Fleming M.D.   On: 01/30/2024 06:01      Mikaylee Arseneau T. Genesis Paget [facility]  If 7PM-7AM, please contact night-coverage [email or web address] 01/30/2024, 11:25 AM

## 2024-01-30 NOTE — Plan of Care (Signed)

## 2024-01-30 NOTE — Progress Notes (Signed)
 Mobility Specialist Progress Note;   01/30/24 1035  Mobility  Activity Transferred from bed to chair  Level of Assistance Minimal assist, patient does 75% or more  Assistive Device Front wheel walker  Distance Ambulated (ft) 3 ft  Activity Response Tolerated fair  Mobility Referral Yes  Mobility visit 1 Mobility  Mobility Specialist Start Time (ACUTE ONLY) 1035  Mobility Specialist Stop Time (ACUTE ONLY) 1100  Mobility Specialist Time Calculation (min) (ACUTE ONLY) 25 min   Pt agreeable to mobility. Required MinA for bed mobility and MinG assist to transfer from bed to chair. Pt limited by RUE pain, however was given pain meds prior. VSS on 1LO2. Pt left comfortably in chair with all needs met, alarm on. Caregiver in room.   Caesar Bookman Mobility Specialist Please contact via SecureChat or [employer] [phone]

## 2024-01-30 NOTE — Evaluation (Signed)
 Occupational Therapy Evaluation Patient Details Name: Christina Sellers MRN: [id number] DOB: 1941/04/04 Today's Date: 01/30/2024   History of Present Illness   Pt is an 83 y/o F presenting to ED on 2/23 after fall hitting lower back at home. CT chest, wtih R 7-9 rib fxs and PNX. CT head and C spine negative. PMH includes heart failure, COPD, DM2, CKD, osteopenia     Clinical Impressions Pt reports ind at baseline with ADLs/functional mobility, lives alone but reports she has a 'housekeeper 9a-5p M-F' who assists with household tasks, per chart review pt has 24/7 caregiver, but will need to confirm. Pt limited by R side pain this session, needing min-mod A for ADLs, min A for transfers with RW. Pt desatting to high 70s with ambulation, incr to 3L, though with poor wave pleth, SpO2 returned to mid 90s once seated, returned to 1L O2. RN notified. Pt presenting with impairments listed below, will follow acutely. Recommend HHOT at d/c if pt has 24/7 assist at home, if not will benefit from postacute rehab at d/c.     If plan is discharge home, recommend the following:   A little help with walking and/or transfers;A little help with bathing/dressing/bathroom;Assistance with cooking/housework;Direct supervision/assist for financial management;Direct supervision/assist for medications management;Assist for transportation;Help with stairs or ramp for entrance     Functional Status Assessment   Patient has had a recent decline in their functional status and demonstrates the ability to make significant improvements in function in a reasonable and predictable amount of time.     Equipment Recommendations   BSC/3in1     Recommendations for Other Services   PT consult     Precautions/Restrictions   Precautions Precautions: Fall Precaution/Restrictions Comments: watch O2 Restrictions Weight Bearing Restrictions Per Provider Order: No     Mobility Bed Mobility                General bed mobility comments: OOB in chair upon arrival and departure    Transfers Overall transfer level: Needs assistance Equipment used: Rolling walker (2 wheels) Transfers: Sit to/from Stand Sit to Stand: Min assist                  Balance Overall balance assessment: Needs assistance Sitting-balance support: Feet supported Sitting balance-Leahy Scale: Good     Standing balance support: During functional activity, Reliant on assistive device for balance Standing balance-Leahy Scale: Poor Standing balance comment: reliant on RW support                           ADL either performed or assessed with clinical judgement   ADL Overall ADL's : Needs assistance/impaired Eating/Feeding: Set up;Sitting   Grooming: Minimal assistance;Sitting;Standing   Upper Body Bathing: Minimal assistance   Lower Body Bathing: Moderate assistance   Upper Body Dressing : Minimal assistance   Lower Body Dressing: Moderate assistance   Toilet Transfer: Minimal assistance;Ambulation;Regular Toilet;Rolling walker (2 wheels)   Toileting- Clothing Manipulation and Hygiene: Moderate assistance       Functional mobility during ADLs: Minimal assistance;Rolling walker (2 wheels)       Vision   Vision Assessment?: No apparent visual deficits     Perception Perception: Within Functional Limits       Praxis Praxis: WFL       Pertinent Vitals/Pain Pain Assessment Faces Pain Scale: Hurts even more Pain Location: R side Pain Descriptors / Indicators: Discomfort Pain Intervention(s): Limited activity within patient's tolerance, Monitored during session,  Repositioned     Extremity/Trunk Assessment Upper Extremity Assessment Upper Extremity Assessment: Generalized weakness (mild limitations of RUE 2/2 rib pain)   Lower Extremity Assessment Lower Extremity Assessment: Defer to PT evaluation   Cervical / Trunk Assessment Cervical / Trunk Assessment: Normal    Communication Communication Communication: Impaired Factors Affecting Communication: Hearing impaired   Cognition Arousal: Alert Behavior During Therapy: WFL for tasks assessed/performed Cognition: No family/caregiver present to determine baseline             OT - Cognition Comments: pt HOH, follows commands with incr time, repetition. Pt aware of deficits, states "i feel like i have a lot of broken ribs"                 Following commands: Intact       Cueing  General Comments   Cueing Techniques: Verbal cues;Gestural cues      Exercises Exercises: Other exercises Other Exercises Other Exercises: IS pulling 600 x5 Other Exercises: educated on bracing with pillow when couging/sneezing/mobilizing as needed   Shoulder Instructions      Home Living Family/patient expects to be discharged to:: Private residence Living Arrangements: Alone Available Help at Discharge: Other (Comment) (housekeeper 9a-5pm M-F) Type of Home: House Home Access: Stairs to enter [employer] of Steps: 3 Entrance Stairs-Rails: Left Home Layout: One level     Bathroom Shower/Tub: Producer, television/film/video: Standard     Home Equipment: Agricultural consultant (2 wheels);Shower seat   Additional Comments: per chart review, pt has 24/7 home care, pt cannot confirm      Prior Functioning/Environment Prior Level of Function : Needs assist;Driving             Mobility Comments: RW PRN ADLs Comments: manages own meds, ind with ADLs,  housekeeper assists with household tasks    OT Problem List: Decreased strength;Decreased range of motion;Decreased activity tolerance;Impaired balance (sitting and/or standing);Decreased coordination;Decreased safety awareness;Cardiopulmonary status limiting activity   OT Treatment/Interventions: Self-care/ADL training;Therapeutic exercise;Energy conservation;DME and/or AE instruction;Therapeutic activities;Balance training;Patient/family  education      OT Goals(Current goals can be found in the care plan section)   Acute Rehab OT Goals Patient Stated Goal: none stated OT Goal Formulation: With patient Time For Goal Achievement: 02/13/24 Potential to Achieve Goals: Good ADL Goals Pt Will Perform Upper Body Dressing: with supervision;sitting Pt Will Perform Lower Body Dressing: with supervision;sit to/from stand;sitting/lateral leans Pt Will Transfer to Toilet: with supervision;ambulating;regular height toilet Pt Will Perform Tub/Shower Transfer: with supervision;ambulating;Shower transfer;shower seat Additional ADL Goal #1: Pt will tolerate OOB activity x10 min with SpO2 90% in order to improve activity tolerance for ADLs.   OT Frequency:  Min 1X/week    Co-evaluation              AM-PAC OT "6 Clicks" Daily Activity     Outcome Measure Help from another person eating meals?: A Little Help from another person taking care of personal grooming?: A Little Help from another person toileting, which includes using toliet, bedpan, or urinal?: A Little Help from another person bathing (including washing, rinsing, drying)?: A Lot Help from another person to put on and taking off regular upper body clothing?: A Little Help from another person to put on and taking off regular lower body clothing?: A Lot 6 Click Score: 16   End of Session Equipment Utilized During Treatment: Gait belt;Rolling walker (2 wheels);Oxygen Nurse Communication: Mobility status (O2 desat)  Activity Tolerance: Patient tolerated treatment well Patient left:  in chair;with call bell/phone within reach;with chair alarm set  OT Visit Diagnosis: Unsteadiness on feet (R26.81);Other abnormalities of gait and mobility (R26.89);Muscle weakness (generalized) (M62.81);History of falling (Z91.81)                Time: 1610-9604 OT Time Calculation (min): 35 min Charges:  OT General Charges $OT Visit: 1 Visit OT Evaluation $OT Eval Moderate Complexity:  1 Mod OT Treatments $Self Care/Home Management : 8-22 mins  Carver Fila, OTD, OTR/L SecureChat Preferred Acute Rehab [phone]   Carver Fila Koonce 01/30/2024, 12:40 PM

## 2024-01-30 NOTE — IPAL (Signed)
  Interdisciplinary Goals of Care Family Meeting   Date carried out: 01/30/2024  Location of the meeting: Phone conference  Member's involved: Physician and Family Member or next of kin Genevie Cheshire, son and [name], daughter  Durable Power of Pensions consultant or acting medical decision maker: Genevie Cheshire and [name] Discussed patient's condition including rib fractures, cataracts, AKI and other finding with patient's son and daughter over the phone.  We also had extensive goals of care discussion with focus on CODE STATUS.  Patient has underlying dementia to comprehend or understand such complex conversation.  Genevie Cheshire and [name] claims to [employer] and they would be faxing over paperwork.  Per Genevie Cheshire and [name], she had another daughter by the name Cathlean Cower that they did not want to be involved in patient's care per patient's wish past.  We have discussed pros and cons of CPR in light of his comorbidity including but not limited to advanced age, dementia, osteoporosis, hypertension.  With age and comorbidity, chance of successful CPR is very low.  It also comes with cost including significant pain, distress, rib fracture, anoxic brain injury, poor quality of life and increased risk of cardiopulmonary arrest afterward.  Genevie Cheshire and [name] voiced understanding this and appreciated the honest discussion.  They believe DNR/DNI is to the mother's best interest.  However, they would like to discuss this a little more and call us back to let us about the decision.  For now, she remains full code until we hear back from [place].  Discussion: We discussed goals of care for [employer] .    Code status:   Code Status: Full Code   Disposition: Continue current acute care  Time spent for the meeting: 30 minutes    Almon Hercules, MD  01/30/2024, 1:01 PM

## 2024-01-30 NOTE — TOC Initial Note (Addendum)
 Transition of Care [facility]) - Initial/Assessment Note    Patient Details  Name: Christina Sellers MRN: [id number] Date of Birth: 09-01-1941  Transition of Care [facility]) CM/SW Contact:    Gala Lewandowsky, RN Phone Number: 01/30/2024, 1:46 PM  Clinical Narrative: Patient presented s/p fall. Daughter Verlon Au was at the bedside during the visit. PTA patient was from home with 24/7 supervision. Daughter states patient goes to [facility] 2 x week for physical therapy. Patient has DME rolling walker. Awaiting PT/OT consult for disposition needs. Case Manager will continue to follow for additional transition of care needs.                   Expected Discharge Plan: Home w Home Health Services Barriers to Discharge: Continued Medical Work up   Patient Goals and CMS Choice Patient states their goals for this hospitalization and ongoing recovery are:: plan to return home with 24/7 supervision.   Expected Discharge Plan and Services In-house Referral: NA Discharge Planning Services: CM Consult Post Acute Care Choice:  (personal care aides) Living arrangements for the past 2 months: Single Family Home   Prior Living Arrangements/Services Living arrangements for the past 2 months: Single Family Home Lives with:: Other (Comment) (24/7 caregivers) Patient language and need for interpreter reviewed:: Yes        Need for Family Participation in Patient Care: Yes (Comment) Care giver support system in place?: Yes (comment)   Criminal Activity/Legal Involvement Pertinent to Current Situation/Hospitalization: No - Comment as needed  Activities of Daily Living   ADL Screening (condition at time of admission) Independently performs ADLs?: Yes (appropriate for developmental age) Is the patient deaf or have difficulty hearing?: Yes Does the patient have difficulty seeing, even when wearing glasses/contacts?: No Does the patient have difficulty concentrating, remembering, or making  decisions?: No  Permission Sought/Granted Permission sought to share information with : Case Manager, Family Supports   Emotional Assessment Appearance:: Appears stated age Attitude/Demeanor/Rapport: Unable to Assess Affect (typically observed): Unable to Assess Orientation: : Oriented to Self Alcohol / Substance Use: Not Applicable Psych Involvement: No (comment)  Admission diagnosis:  Chest wall trauma [S29.9XXA] Traumatic pneumothorax, initial encounter [S27.0XXA] Fall, initial encounter [id number]] Closed fracture of multiple ribs of right side, initial encounter [S22.41XA] Patient Active Problem List   Diagnosis Date Noted   Advance care planning 01/30/2024   Chest wall trauma 01/29/2024   Pneumothorax 01/29/2024   Rib fractures 01/29/2024   Paroxysmal SVT (supraventricular tachycardia) (HCC) 06/30/2023   Bilateral carotid artery stenosis 01/21/2023   Abnormal finding of blood chemistry, unspecified 05/13/2021   Acquired hallux valgus 05/13/2021   Age-related osteoporosis without current pathological fracture 05/13/2021   Allergic rhinitis 05/13/2021   Anxiety disorder 05/13/2021   Apnea 05/13/2021   Avascular necrosis of bone of hip (HCC) 05/13/2021   Benign paroxysmal positional vertigo 05/13/2021   Bilateral tinnitus 05/13/2021   Bronchospasm 05/13/2021   Chronic kidney disease, stage 3 unspecified (HCC) 05/13/2021   Chronic obstructive pulmonary disease, unspecified (HCC) 05/13/2021   Constipation 05/13/2021   Costochondritis 05/13/2021   Disequilibrium 05/13/2021   Dysphagia 05/13/2021   DOE (dyspnea on exertion) 05/13/2021   Elevated blood-pressure reading, without diagnosis of hypertension 05/13/2021   Fall 05/13/2021   Fatigue 05/13/2021   Gastro-esophageal reflux disease without esophagitis 05/13/2021   Generalized hyperhidrosis 05/13/2021   Gout 05/13/2021   Hair loss disorder 05/13/2021   Hematochezia 05/13/2021   Hemorrhage of colon due to  diverticulosis 05/13/2021   Hypersomnia 05/13/2021  Indigestion 05/13/2021   Labyrinthitis of both ears 05/13/2021   Left flank pain 05/13/2021   Leukocytosis 05/13/2021   Lichen planus 05/13/2021   Localized swelling, mass and lump, neck 05/13/2021   Major depression, single episode 05/13/2021   Memory impairment 05/13/2021   Myalgia 05/13/2021   Night sweats 05/13/2021   Noninflammatory disorder of vagina 05/13/2021   Obstructive sleep apnea syndrome 05/13/2021   Osteoarthritis of knee 05/13/2021   Other long term (current) drug therapy 05/13/2021   Pharyngeal dysphagia 05/13/2021   Sciatica 05/13/2021   Sjogren's syndrome (HCC) 05/13/2021   Sleep disorder 05/13/2021   Status post hip replacement 05/13/2021   Superficial phlebitis 05/13/2021   Tear film insufficiency 05/13/2021   Urinary tract infectious disease 05/13/2021   Urticaria 05/13/2021   Ventral hernia without obstruction or gangrene 05/13/2021   Vitamin D deficiency 05/13/2021   H/O total hip arthroplasty, right 03/30/2021   Osteoarthritis of right hip 03/25/2021   Orthostatic hypotension 02/27/2021   IBS (irritable bowel syndrome) 02/11/2021   Hyponatremia 02/11/2021   Acute cystitis without hematuria    Chronic back pain    E-coli UTI 02/01/2021   Dehydration with hyponatremia 02/01/2021   Chronic heart failure with preserved ejection fraction (HCC) 02/01/2021   Hypertension associated with diabetes (HCC) 01/21/2021   Hyperlipidemia associated with type 2 diabetes mellitus (HCC) 01/21/2021   Aortic atherosclerosis (HCC) 01/21/2021   Chronic low back pain 10/05/2019   Type 2 diabetes mellitus with hyperglycemia, without long-term current use of insulin (HCC) 02/21/2019   Hypothyroidism 02/21/2019   Pain of left hip joint 12/08/2018   Dermatochalasis of both eyelids 07/04/2018   Viral labyrinthitis 01/13/2017   Acquired spondylolisthesis 04/30/2014   Degeneration of lumbar intervertebral disc 04/30/2014    Osteopenia 11/22/2013   Labial fusion    PCP:  Thana Ates, MD Pharmacy:   [facility] [place], [place] - [address] [facility] [address] [place] [address] Phone: [phone] Fax: [phone]  Social Drivers of Health (SDOH) Social History: SDOH Screenings   Food Insecurity: No Food Insecurity (01/29/2024)  Housing: Low Risk  (01/29/2024)  Transportation Needs: No Transportation Needs (01/29/2024)  Utilities: Not At Risk (07/15/2023)  Alcohol Screen: Low Risk  (07/15/2023)  Financial Resource Strain: Low Risk  (07/15/2023)  Physical Activity: Inactive (07/15/2023)  Tobacco Use: Medium Risk (01/29/2024)   Readmission Risk Interventions     No data to display

## 2024-01-31 ENCOUNTER — Ambulatory Visit (HOSPITAL_BASED_OUTPATIENT_CLINIC_OR_DEPARTMENT_OTHER): Payer: Medicare Other | Admitting: Physical Therapy

## 2024-01-31 ENCOUNTER — Inpatient Hospital Stay (HOSPITAL_COMMUNITY): Payer: Medicare Other

## 2024-01-31 DIAGNOSIS — W19XXXA Unspecified fall, initial encounter: Secondary | ICD-10-CM | POA: Diagnosis not present

## 2024-01-31 DIAGNOSIS — S299XXA Unspecified injury of thorax, initial encounter: Secondary | ICD-10-CM | POA: Diagnosis not present

## 2024-01-31 DIAGNOSIS — E1165 Type 2 diabetes mellitus with hyperglycemia: Secondary | ICD-10-CM | POA: Diagnosis not present

## 2024-01-31 DIAGNOSIS — I6523 Occlusion and stenosis of bilateral carotid arteries: Secondary | ICD-10-CM | POA: Diagnosis not present

## 2024-01-31 LAB — CBC
HCT: 34 % — ABNORMAL LOW (ref 36.0–46.0)
Hemoglobin: 11.3 g/dL — ABNORMAL LOW (ref 12.0–15.0)
MCH: 31.7 pg (ref 26.0–34.0)
MCHC: 33.2 g/dL (ref 30.0–36.0)
MCV: 95.5 fL (ref 80.0–100.0)
Platelets: 171 10*3/uL (ref 150–400)
RBC: 3.56 MIL/uL — ABNORMAL LOW (ref 3.87–5.11)
RDW: 13.6 % (ref 11.5–15.5)
WBC: 8.9 10*3/uL (ref 4.0–10.5)
nRBC: 0 % (ref 0.0–0.2)

## 2024-01-31 LAB — COMPREHENSIVE METABOLIC PANEL
ALT: 79 U/L — ABNORMAL HIGH (ref 0–44)
AST: 61 U/L — ABNORMAL HIGH (ref 15–41)
Albumin: 3.2 g/dL — ABNORMAL LOW (ref 3.5–5.0)
Alkaline Phosphatase: 128 U/L — ABNORMAL HIGH (ref 38–126)
Anion gap: 9 (ref 5–15)
BUN: 25 mg/dL — ABNORMAL HIGH (ref 8–23)
CO2: 22 mmol/L (ref 22–32)
Calcium: 8.9 mg/dL (ref 8.9–10.3)
Chloride: 109 mmol/L (ref 98–111)
Creatinine, Ser: 1.71 mg/dL — ABNORMAL HIGH (ref 0.44–1.00)
GFR, Estimated: 29 mL/min — ABNORMAL LOW (ref >60)
Glucose, Bld: 88 mg/dL (ref 70–99)
Potassium: 4.1 mmol/L (ref 3.5–5.1)
Sodium: 140 mmol/L (ref 135–145)
Total Bilirubin: 0.8 mg/dL (ref 0.0–1.2)
Total Protein: 6 g/dL — ABNORMAL LOW (ref 6.5–8.1)

## 2024-01-31 LAB — CK: Total CK: 83 U/L (ref 38–234)

## 2024-01-31 LAB — GLUCOSE, CAPILLARY
Glucose-Capillary: 124 mg/dL — ABNORMAL HIGH (ref 70–99)
Glucose-Capillary: 172 mg/dL — ABNORMAL HIGH (ref 70–99)
Glucose-Capillary: 133 mg/dL — ABNORMAL HIGH (ref 70–99)
Glucose-Capillary: 126 mg/dL — ABNORMAL HIGH (ref 70–99)

## 2024-01-31 LAB — MAGNESIUM: Magnesium: 2.1 mg/dL (ref 1.7–2.4)

## 2024-01-31 MED ORDER — LACTATED RINGERS IV BOLUS
250.0000 mL | INTRAVENOUS | Status: AC
  Administered 2024-01-31 (×2): 250 mL via INTRAVENOUS

## 2024-01-31 MED ORDER — ENSURE ENLIVE PO LIQD
237.0000 mL | Freq: Two times a day (BID) | ORAL | Status: DC
  Administered 2024-01-31 – 2024-02-03 (×4): 237 mL via ORAL

## 2024-01-31 MED ORDER — SENNOSIDES-DOCUSATE SODIUM 8.6-50 MG PO TABS: 1.0000 | ORAL_TABLET | Freq: Two times a day (BID) | ORAL | Status: DC | PRN

## 2024-01-31 MED ORDER — CARVEDILOL 6.25 MG PO TABS
6.2500 mg | ORAL_TABLET | Freq: Two times a day (BID) | ORAL | Status: DC
  Administered 2024-01-31 – 2024-02-03 (×6): 6.25 mg via ORAL
  Filled 2024-01-31 (×7): qty 1

## 2024-01-31 MED ORDER — PANTOPRAZOLE SODIUM 40 MG IV SOLR
40.0000 mg | Freq: Once | INTRAVENOUS | Status: AC
  Administered 2024-01-31: 40 mg via INTRAVENOUS
  Filled 2024-01-31: qty 10

## 2024-01-31 NOTE — Progress Notes (Signed)
 PROGRESS NOTE  Christina Sellers ZOX:[id number] DOB: 1941/06/18   PCP: Thana Ates, MD  Patient is from: Home.  Independently ambulates at baseline.  Has 24/7 home care.  DOA: 01/29/2024 LOS: 2  Chief complaints Chief Complaint  Patient presents with   Flank Pain     Brief Narrative / Interim history: 83 year old F with PMH of dementia, osteoporosis, COPD/asthma, DM-2, PVD, carotid artery stenosis, gout, osteoarthritis, DJD, HTN, hypothyroidism and diminished hearing brought to ED by EMS after she fell and hit her lower back on right side while ambulating to the bathroom, and admitted with chest wall trauma with multiple ribs fractures (7th through 9th) and small traumatic right-sided pneumothorax.    In ED, she was hypertensive.  Other vital stable.  WBC 13.3.  CK normal.  K3.1.  CO2 17.  AG 17.  Glucose 67. Cr 2.31 (baseline 1-1.2).  Otherwise CMP and CBC without significant finding.  CT head and CT cervical spine without acute finding but stable degenerative disease, bulky calcified cervical carotid artery sclerosis.  CT chest/abdomen/pelvis showed moderate right pneumothorax with patchy airspace opacity in the dependent right lung base and acute fracture of right 7th through 9th ribs, 8 to 9 mm low-density water density lesion in pancreatic tail, left femoral AVN and aortic atherosclerosis.    Trauma surgery consulted and following.  Subsequent x-ray with improved pneumothorax.  Trauma surgery available as needed.  Likely discharge home in the next 24 to 48 hours if pain under control and liberated off oxygen   Subjective: Seen and examined earlier this morning.  Reports significant pain in her right chest.  She rates her pain 9/10 although she was sleeping through when I walked in.  She says it hurts when she takes deep breath.  No other complaints.  She is oriented x 4 with use of calendar on the wall.  When asked why she was in the hospital, she said "I jumped of [address]" which  she later describes as "I fell and broke my ribs".  She also tells me that he had children including Christina Sellers, Christina Sellers and Christina Sellers can make medical decision when she is not able to.  Personal caregiver in the room at bedside during my visit.  Met with patient's daughter, Christina Sellers later in the day.  We have gone over finding.  Also discussed about CODE STATUS. Christina Sellers stated that her mother has expressed her desire to be DNR and DNI in the past.     Objective: Vitals:   01/30/24 1928 01/31/24 0455 01/31/24 0743 01/31/24 1113  BP: (!) 153/55 (!) 181/64 (!) 158/55 (!) 145/56  Pulse:  77 71   Resp:   14   Temp: 98.4 F (36.9 C) 98.2 F (36.8 C) 98.2 F (36.8 C)   TempSrc: Oral Oral Oral   SpO2:   94%   Weight:      Height:        Examination:  GENERAL: Appears frail.  No apparent distress. HEENT: MMM.  Vision grossly intact.  Diminished hearing but better with his hearing aid. NECK: Supple.  No apparent JVD.  RESP:  No IWOB.  Fair aeration bilaterally. CVS:  RRR. Heart sounds normal.  ABD/GI/GU: BS+. Abd soft, NTND.  MSK/EXT:  Moves extremities. No apparent deformity. No edema.  SKIN: Small bruise on right leg. NEURO: Sleepy but wakes to voice.  Oriented x 4 with use of calendar on the wall.  No focal neurodeficit. PSYCH: Calm. Normal affect.   Procedures:  None  Microbiology summarized: None  Assessment and plan: Accidental fall at home: [place] while walking to the bathroom.  Patient denies prodromes but not a great historian Chest wall trauma due to fall at home Multiple rib fractures with right-sided pneumothorax due to fall at home -Imaging showed right 7th through 9th rib fracture with atelectasis. -Subsequent x-ray with improved pneumothorax. -Appreciate help by trauma surgery-Mobilize, aggressive pulm toilet, IS, pain control.  -Scheduled Tylenol with as needed oxycodone and Dilaudid for pain control -Fall precaution, PT/OT -Encourage incentive spirometry.  Wean off  oxygen  AKI on CKD-3A: Baseline Cr 1.1-1.3.  Patient with dementia or Lasix, Aldactone and Jardiance which could increase risk of dehydration.  No obstruction noted on CT.  CK normal.  AKI improving Recent Labs    02/28/23 1510 03/21/23 1137 04/14/23 0412 04/25/23 1226 05/01/23 0442 06/30/23 1142 07/15/23 1321 01/29/24 0328 01/30/24 0407 01/31/24 0248  BUN 37* 33* 49* 40* 35* 19 23 40* 27* 25*  CREATININE 1.38* 1.19* 1.26* 1.18* 1.19* 1.08* 1.29* 2.31* 1.84* 1.71*  -Strict intake and output. -Hold nephrotoxic meds including Lasix, Aldactone and Jardiance -Will give a small bolus of fluids today -Renal function in the morning  NIDDM-2 with hyperglycemia and hyperlipidemia: A1c 6.1%.  On Jardiance and Lipitor at home. Recent Labs  Lab 01/30/24 1155 01/30/24 1603 01/30/24 2153 01/31/24 0740 01/31/24 1123  GLUCAP 160* 138* 137* 124* 172*  -Hold home Jardiance due to risk of dehydration -Hold Lipitor due to elevated liver enzymes  Dementia with behavioral disturbance: Had an episode of agitation/confusion the night of 2/23-24. Currently awake and oriented x 4 with use of calendar.  Throwing some humors.  CT head without acute finding.  No focal neurodeficit on exam. -Continue home memantine -Reorientation and delirium precaution  Elevated liver enzymes: Pattern consistent with rhabdo alcohol but CK within normal.  Does not drink alcohol.  She is on Lipitor at home.  Improved. -Continue monitoring closely.  Will stop Tylenol if it continues to rise -Continue holding statin.  Essential hypertension: BP slightly elevated partly due to pain. -Pain control as above -Continue home amlodipine -Added low-dose Coreg. -Continue holding diuretics.  Chronic back pain/osteoarthritis/degenerative disc disease -Pain control and PT/OT   Hypothyroidism: TSH normal. -Continue home Synthroid  Bilateral carotid artery atherosclerosis Aortic atherosclerosis -Continue statin.   Mood  disorder: Stable. -Continue home Lexapro  GERD -Continue home Protonix  Dysphagia?  Initially evaluated by SLP and cleared for regular diet.  RN reports difficulty swallowing pills today -Reconsulted SLP  History of osteoporosis: Seems like she is on Prolia injection every 6 months.  Pancreatic lesion: CT abdomen and pelvis showed 8 to 9 mm water density pancreatic tail lesion.  -Follow-up MRI abdomen in 6 months recommended  Advance care planning: See discussion with patient's children, Christina Sellers and Lisa from yesterday.  Also talked to her other daughter, Christina Sellers at bedside today. Christina Sellers stated that her mother had expressed her desire to be DNR and DNI in the past. I have shared this with Christina Sellers and Christina Sellers over the phone. They are still undecided about CODE STATUS, asked for more time. Body mass index is 21.61 kg/m.          DVT prophylaxis:  SCDs Start: 01/29/24 1610  Code Status: Full code. Family Communication: Updated patient's daughter, Christina Sellers at bedside. Also updated Christina Sellers and Christina Sellers over the phone Level of care: Progressive Status is: Inpatient Remains inpatient appropriate because: Fall at home, multiple rib fractures and pneumothorax   Final disposition: Likely home  in the next 24 to 48 hours. Consultants:  Trauma surgery  55 minutes with more than 50% spent in reviewing records, counseling patient/family and coordinating care.   Sch Meds:  Scheduled Meds:  acetaminophen  650 mg Oral Q6H   amLODipine  5 mg Oral BID   carvedilol  6.25 mg Oral BID WC   cycloSPORINE  1 drop Both Eyes BID   escitalopram  10 mg Oral QHS   levothyroxine  75 mcg Oral q morning   memantine  5 mg Oral BID   pantoprazole  40 mg Oral BID   sucralfate  1 g Oral BID   Continuous Infusions: PRN Meds:.HYDROmorphone (DILAUDID) injection, methocarbamol, ondansetron **OR** ondansetron (ZOFRAN) IV, oxyCODONE  Antimicrobials: Anti-infectives (From admission, onward)    None        I have  personally reviewed the following labs and images: CBC: Recent Labs  Lab 01/29/24 0328 01/30/24 0407 01/31/24 0248  WBC 13.3* 7.4 8.9  NEUTROABS 11.0*  --   --   HGB 14.4 10.1* 11.3*  HCT 44.5 30.3* 34.0*  MCV 95.7 94.7 95.5  PLT 202 154 171   BMP &GFR Recent Labs  Lab 01/29/24 0328 01/30/24 0407 01/31/24 0248  NA 135 137 140  K 3.1* 3.9 4.1  CL 101 109 109  CO2 17* 19* 22  GLUCOSE 167* 112* 88  BUN 40* 27* 25*  CREATININE 2.31* 1.84* 1.71*  CALCIUM 9.7 8.1* 8.9  MG 2.1  --  2.1  PHOS 3.3  --   --    Estimated Creatinine Clearance: 17.6 mL/min (A) (by C-G formula based on SCr of 1.71 mg/dL (H)). Liver & Pancreas: Recent Labs  Lab 01/29/24 0328 01/30/24 0407 01/31/24 0248  AST 26 150* 61*  ALT 15 116* 79*  ALKPHOS 89 150* 128*  BILITOT 1.2 0.8 0.8  PROT 8.1 5.6* 6.0*  ALBUMIN 4.8 3.2* 3.2*   Recent Labs  Lab 01/29/24 0328  LIPASE 32   No results for input(s): "AMMONIA" in the last 168 hours. Diabetic: Recent Labs    01/29/24 2048  HGBA1C 6.1*   Recent Labs  Lab 01/30/24 1155 01/30/24 1603 01/30/24 2153 01/31/24 0740 01/31/24 1123  GLUCAP 160* 138* 137* 124* 172*   Cardiac Enzymes: Recent Labs  Lab 01/29/24 0328 01/30/24 0404 01/31/24 0248  CKTOTAL 77 154 83   Recent Labs    06/30/23 1142  PROBNP 1,397*   Coagulation Profile: No results for input(s): "INR", "PROTIME" in the last 168 hours. Thyroid Function Tests: Recent Labs    01/30/24 1239  TSH 3.081   Lipid Profile: No results for input(s): "CHOL", "HDL", "LDLCALC", "TRIG", "CHOLHDL", "LDLDIRECT" in the last 72 hours. Anemia Panel: No results for input(s): "VITAMINB12", "FOLATE", "FERRITIN", "TIBC", "IRON", "RETICCTPCT" in the last 72 hours. Urine analysis:    Component Value Date/Time   COLORURINE YELLOW 01/29/2024 0312   APPEARANCEUR CLEAR 01/29/2024 0312   LABSPEC 1.016 01/29/2024 0312   PHURINE 6.0 01/29/2024 0312   GLUCOSEU >=500 (A) 01/29/2024 0312   HGBUR  NEGATIVE 01/29/2024 0312   BILIRUBINUR NEGATIVE 01/29/2024 0312   KETONESUR NEGATIVE 01/29/2024 0312   PROTEINUR >=300 (A) 01/29/2024 0312   UROBILINOGEN 0.2 01/22/2015 1100   NITRITE NEGATIVE 01/29/2024 0312   LEUKOCYTESUR NEGATIVE 01/29/2024 0312   Sepsis Labs: Invalid input(s): "PROCALCITONIN", "LACTICIDVEN"  Microbiology: No results found for this or any previous visit (from the past 240 hours).  Radiology Studies: DG CHEST PORT 1 VIEW Result Date: 01/31/2024 CLINICAL DATA:  [id number]  Pneumothorax, right [id number] EXAM: PORTABLE CHEST 1 VIEW COMPARISON:  01/30/2024 FINDINGS: Stable cardiomegaly. Trace right apical pneumothorax, slightly improved. Persistent right basilar opacity with possible small effusion. Left lung remains clear. IMPRESSION: 1. Trace right apical pneumothorax, slightly improved. 2. Persistent right basilar opacity with possible small effusion. Electronically Signed   By: Duanne Guess D.O.   On: 01/31/2024 09:47      Chianna Spirito T. Dennie Moltz [facility]  If 7PM-7AM, please contact night-coverage [email or web address] 01/31/2024, 1:28 PM

## 2024-01-31 NOTE — Progress Notes (Signed)
 Subjective: Admits to some chest pain today and still feels a little SOB, but is sating 92% on 1L   Objective: Vital signs in last 24 hours: Temp:  [98.2 F (36.8 C)-98.9 F (37.2 C)] 98.2 F (36.8 C) (02/25 0743) Pulse Rate:  [71-79] 71 (02/25 0743) Resp:  [13-14] 14 (02/25 0743) BP: (153-181)/(55-64) 158/55 (02/25 0743) SpO2:  [94 %] 94 % (02/25 0743)    Intake/Output from previous day: 02/24 0701 - 02/25 0700 In: -  Out: 900 [Urine:900] Intake/Output this shift: No intake/output data recorded.  PE: Gen: NAD HEENT: PERRL.   Heart: regular Lungs: CTAB, mild chest wall tenderness as expected Abd: soft, NT, ND Psych: difficult to ask many questions due to [facility], but is alert   Lab Results:  Recent Labs    01/30/24 0407 01/31/24 0248  WBC 7.4 8.9  HGB 10.1* 11.3*  HCT 30.3* 34.0*  PLT 154 171   BMET Recent Labs    01/30/24 0407 01/31/24 0248  NA 137 140  K 3.9 4.1  CL 109 109  CO2 19* 22  GLUCOSE 112* 88  BUN 27* 25*  CREATININE 1.84* 1.71*  CALCIUM 8.1* 8.9   PT/INR No results for input(s): "LABPROT", "INR" in the last 72 hours. CMP     Component Value Date/Time   NA 140 01/31/2024 0248   NA 137 07/15/2023 1321   K 4.1 01/31/2024 0248   CL 109 01/31/2024 0248   CO2 22 01/31/2024 0248   GLUCOSE 88 01/31/2024 0248   BUN 25 (H) 01/31/2024 0248   BUN 23 07/15/2023 1321   CREATININE 1.71 (H) 01/31/2024 0248   CALCIUM 8.9 01/31/2024 0248   PROT 6.0 (L) 01/31/2024 0248   ALBUMIN 3.2 (L) 01/31/2024 0248   AST 61 (H) 01/31/2024 0248   ALT 79 (H) 01/31/2024 0248   ALKPHOS 128 (H) 01/31/2024 0248   BILITOT 0.8 01/31/2024 0248   GFRNONAA 29 (L) 01/31/2024 0248   GFRAA 55 (L) 10/12/2013 1945   Lipase     Component Value Date/Time   LIPASE 32 01/29/2024 0328       Studies/Results: DG CHEST PORT 1 VIEW Result Date: 01/31/2024 CLINICAL DATA:  [id number] Pneumothorax, right [id number] EXAM: PORTABLE CHEST 1 VIEW COMPARISON:  01/30/2024 FINDINGS:  Stable cardiomegaly. Trace right apical pneumothorax, slightly improved. Persistent right basilar opacity with possible small effusion. Left lung remains clear. IMPRESSION: 1. Trace right apical pneumothorax, slightly improved. 2. Persistent right basilar opacity with possible small effusion. Electronically Signed   By: Duanne Guess D.O.   On: 01/31/2024 09:47   DG CHEST PORT 1 VIEW Result Date: 01/30/2024 CLINICAL DATA:  83 year old female status post fall with right pneumothorax, right rib fractures. EXAM: PORTABLE CHEST 1 VIEW COMPARISON:  Portable chest 01/29/2024 and earlier. FINDINGS: Portable AP semi upright view at 0544 hours. Small right apical pneumothorax persists. Lower lung volumes, increased confluent right lung base opacity. Stable underlying cardiomegaly. Left lung appears stable. Stable cholecystectomy clips. Nonobstructed visible bowel gas pattern. Right rib fractures redemonstrated. Stable visualized osseous structures. IMPRESSION: 1. Persistent small right apical pneumothorax. Increasing right lung base opacity which could be increasing atelectasis or small pleural effusion. 2. Right rib fractures. Stable cardiomegaly. Left lung appears negative. Electronically Signed   By: Odessa Fleming M.D.   On: 01/30/2024 06:01    Anti-infectives: Anti-infectives (From admission, onward)    None        Assessment/Plan Fall Right 7-9 rib fxs with apical PTX -  repeat CXR today shows overall stability and improvement of her PTX now to trace status, RLL opacity c/w atelectasis.  Mobilize, aggressive pulm toilet, IS, pain control.  No further needs for repeat CXR at this time. 9mm pancreatic tail lesion - outpatient MRI per PCP in 6 months Elevation of LFTs - unclear etiology. Improving today. AKI - per medicine Anemia - monitor, stable Other medical problems per primary service.    No other traumatic needs at this time.  The PTX is stable and actually improving.  No further CXR follow up  warranted pending worsening in clinic status.  We will be available as needed  FEN - HH diet VTE - per primary service, may have from our standpoint ID - none currently needed  I reviewed hospitalist notes, last 24 h vitals and pain scores, last 48 h intake and output, last 24 h labs and trends, and last 24 h imaging results.   LOS: 2 days    Letha Cape , [facility] Surgery 01/31/2024, 10:21 AM Please see Amion for pager number during day hours 7:00am-4:30pm or 7:00am -11:30am on weekends

## 2024-01-31 NOTE — Plan of Care (Signed)
 Patient has their protein shakes at bedside, attempting to get more PO intake, family at bedside encouraging patient to eat.  Will continue to monitor patient.

## 2024-01-31 NOTE — TOC CAGE-AID Note (Signed)
 Transition of Care [facility]) - CAGE-AID Screening   Patient Details  Name: Christina Sellers MRN: [id number] Date of Birth: Nov 26, 1941  Transition of Care [facility]) CM/SW Contact:    Janora Norlander, RN Phone Number: [phone] 01/31/2024, 3:08 PM   Clinical Narrative: Pt currently in the hospital after sustaining rib fractures due to a fall.  Pt has been confused therefore I am unable to adequately complete screening.  However, pt has a hx of alcoholism in the past and has had agitation resulting in the need for ativan.  Will be on standby.  No resources given at this time.     CAGE-AID Screening: Substance Abuse Screening unable to be completed due to: : Patient unable to participate

## 2024-01-31 NOTE — Evaluation (Signed)
 Physical Therapy Evaluation Patient Details Name: Christina Sellers MRN: [id number] DOB: [birth year] Today's Date: 01/31/2024  History of Present Illness  Pt is an 83 y/o F presenting to ED on 2/23 after fall hitting lower back at home. CT chest, wtih R 7-9 rib fxs and PNX. CT head and C spine negative. PMH includes heart failure, COPD, DM2, CKD, osteopenia.   Clinical Impression  Pt admitted with above diagnosis. PTA, pt was independent with functional mobility using a RW and independent with ADLs. She resides alone in a one story house with 3 STE, but has 24/7 assistance provided through a scheduled housekeeper and two different personal care attendants. Pt currently with functional limitations due to the deficits listed below (see PT Problem List). Pt required min-modA for supine>sit and CGA for OOB using RW. Pt's primary limiting factor is pain. Pt will benefit from acute skilled PT to increase her independence and safety with mobility to allow d/c Home with HHPT.       If plan is discharge home, recommend the following: A little help with walking and/or transfers;A little help with bathing/dressing/bathroom;Assistance with cooking/housework;Direct supervision/assist for medications management;Direct supervision/assist for financial management;Assist for transportation;Help with stairs or ramp for entrance;Supervision due to cognitive status   Can travel by private vehicle        Equipment Recommendations None recommended by PT (Pt already has DME)  Recommendations for Other Services       Functional Status Assessment Patient has had a recent decline in their functional status and demonstrates the ability to make significant improvements in function in a reasonable and predictable amount of time.     Precautions / Restrictions Precautions Precautions: Fall Recall of Precautions/Restrictions: Impaired Precaution/Restrictions Comments: watch O2 Restrictions Weight Bearing Restrictions  Per Provider Order: No      Mobility  Bed Mobility Overal bed mobility: Needs Assistance Bed Mobility: Supine to Sit     Supine to sit: Min assist, Mod assist     General bed mobility comments: Cued pt on sequencing to sit on EOB. She required minA to bring trunk upright and modA to scoot fwd via bedpad until feet supported.    Transfers Overall transfer level: Needs assistance Equipment used: Rolling walker (2 wheels) Transfers: Sit to/from Stand Sit to Stand: Contact guard assist           General transfer comment: STS from lowest bed height. Pt demonstrated proper hand placement and was able to power up into standing.    Ambulation/Gait Ambulation/Gait assistance: Contact guard assist Gait Distance (Feet): 20 Feet Assistive device: Rolling walker (2 wheels) Gait Pattern/deviations: Step-to pattern, Narrow base of support, Trunk flexed, Decreased stride length Gait velocity: Decreased Gait velocity interpretation: <1.31 ft/sec, indicative of household ambulator   General Gait Details: Pt took small steps with RW a little too far infront of her. Cued pt to maintain body inside RW and push through BUE to increase upright posture. She reported inability to adjust given R-sided pain. Pt was able to navigate around the edge of her bed to recliner chair.  Stairs            Wheelchair Mobility     Tilt Bed    Modified Rankin (Stroke Patients Only)       Balance Overall balance assessment: Needs assistance Sitting-balance support: Feet supported, No upper extremity supported Sitting balance-Leahy Scale: Fair Sitting balance - Comments: Pt sat EOB with CGA at trunk.   Standing balance support: During functional activity, Reliant on assistive device  for balance, Bilateral upper extremity supported Standing balance-Leahy Scale: Poor Standing balance comment: Pt is dependent on RW for support when OOB.                             Pertinent  Vitals/Pain Pain Assessment Pain Assessment: 0-10 Pain Score: 9  Pain Location: R side Pain Descriptors / Indicators: Aching, Discomfort Pain Intervention(s): Monitored during session, Limited activity within patient's tolerance    Home Living Family/patient expects to be discharged to:: Private residence Living Arrangements: Alone Available Help at Discharge: Personal care attendant;Other (Comment);Available 24 hours/day;Family;Available PRN/intermittently (Housekeeper and two individuals from a medical agency [employer] and [employer] are able to provide 24/7 coverage. Genevie Cheshire the pt's son is the only local child and can assist prn.) Type of Home: House Home Access: Stairs to enter Entrance Stairs-Rails: Left Entrance Stairs-Number of Steps: 3   Home Layout: One level Home Equipment: Agricultural consultant (2 wheels);Shower seat;Hand held shower head;Grab bars - toilet;Grab bars - tub/shower      Prior Function Prior Level of Function : History of Falls (last six months);Independent/Modified Independent             Mobility Comments: Indep with RW. ADLs Comments: Indep with ADLs, but asks for supervision during showers d/t fear of falling. Relies on support for transportation. Bubble packs for her medications.     Extremity/Trunk Assessment   Upper Extremity Assessment Upper Extremity Assessment: Defer to OT evaluation    Lower Extremity Assessment Lower Extremity Assessment: Generalized weakness    Cervical / Trunk Assessment Cervical / Trunk Assessment: Normal  Communication   Communication Communication: Impaired Factors Affecting Communication: Hearing impaired    Cognition Arousal: Alert Behavior During Therapy: WFL for tasks assessed/performed   PT - Cognitive impairments: History of cognitive impairments (Dementia)                       PT - Cognition Comments: Pt A,Ox4. She requires extra time for command following d/t HOH. Following commands: Intact        Cueing Cueing Techniques: Verbal cues, Gestural cues     General Comments General comments (skin integrity, edema, etc.): VSS on 1L O2 via [place].    Exercises     Assessment/Plan    PT Assessment Patient needs continued PT services  PT Problem List Decreased strength;Decreased activity tolerance;Decreased balance;Decreased mobility;Pain       PT Treatment Interventions DME instruction;Gait training;Stair training;Functional mobility training;Therapeutic exercise;Therapeutic activities;Balance training;Neuromuscular re-education;Patient/family education    PT Goals (Current goals can be found in the Care Plan section)  Acute Rehab PT Goals Patient Stated Goal: Return Home PT Goal Formulation: With patient Time For Goal Achievement: 02/14/24 Potential to Achieve Goals: Good    Frequency Min 1X/week     Co-evaluation               AM-PAC PT "6 Clicks" Mobility  Outcome Measure Help needed turning from your back to your side while in a flat bed without using bedrails?: A Lot Help needed moving from lying on your back to sitting on the side of a flat bed without using bedrails?: A Lot Help needed moving to and from a bed to a chair (including a wheelchair)?: A Little Help needed standing up from a chair using your arms (e.g., wheelchair or bedside chair)?: A Little Help needed to walk in hospital room?: A Little Help needed climbing 3-5 steps with  a railing? : A Lot 6 Click Score: 15    End of Session Equipment Utilized During Treatment: Gait belt;Oxygen Activity Tolerance: Patient limited by pain Patient left: in chair;with call bell/phone within reach;with chair alarm set;with family/visitor present Nurse Communication: Mobility status PT Visit Diagnosis: Muscle weakness (generalized) (M62.81);History of falling (Z91.81);Unsteadiness on feet (R26.81);Difficulty in walking, not elsewhere classified (R26.2);Pain Pain - Right/Left: Right Pain - part of body:   (Flank)    Time: 1610-9604 PT Time Calculation (min) (ACUTE ONLY): 37 min   Charges:   PT Evaluation $PT Eval Moderate Complexity: 1 Mod PT Treatments $Gait Training: 8-22 mins PT General Charges $$ ACUTE PT VISIT: 1 Visit         Cheri Guppy, PT, DPT Acute Rehabilitation Services Office: [phone] Secure Chat Preferred  Richardson Chiquito 01/31/2024, 11:58 AM

## 2024-02-01 DIAGNOSIS — W19XXXA Unspecified fall, initial encounter: Secondary | ICD-10-CM | POA: Diagnosis not present

## 2024-02-01 DIAGNOSIS — S270XXA Traumatic pneumothorax, initial encounter: Secondary | ICD-10-CM | POA: Diagnosis not present

## 2024-02-01 DIAGNOSIS — S299XXA Unspecified injury of thorax, initial encounter: Secondary | ICD-10-CM | POA: Diagnosis not present

## 2024-02-01 DIAGNOSIS — S2241XA Multiple fractures of ribs, right side, initial encounter for closed fracture: Secondary | ICD-10-CM | POA: Diagnosis not present

## 2024-02-01 LAB — COMPREHENSIVE METABOLIC PANEL
ALT: 46 U/L — ABNORMAL HIGH (ref 0–44)
AST: 29 U/L (ref 15–41)
Albumin: 2.7 g/dL — ABNORMAL LOW (ref 3.5–5.0)
Alkaline Phosphatase: 115 U/L (ref 38–126)
Anion gap: 7 (ref 5–15)
BUN: 27 mg/dL — ABNORMAL HIGH (ref 8–23)
CO2: 24 mmol/L (ref 22–32)
Calcium: 8.9 mg/dL (ref 8.9–10.3)
Chloride: 105 mmol/L (ref 98–111)
Creatinine, Ser: 1.76 mg/dL — ABNORMAL HIGH (ref 0.44–1.00)
GFR, Estimated: 28 mL/min — ABNORMAL LOW (ref >60)
Glucose, Bld: 108 mg/dL — ABNORMAL HIGH (ref 70–99)
Potassium: 3.7 mmol/L (ref 3.5–5.1)
Sodium: 136 mmol/L (ref 135–145)
Total Bilirubin: 0.6 mg/dL (ref 0.0–1.2)
Total Protein: 5.2 g/dL — ABNORMAL LOW (ref 6.5–8.1)

## 2024-02-01 LAB — GLUCOSE, CAPILLARY
Glucose-Capillary: 105 mg/dL — ABNORMAL HIGH (ref 70–99)
Glucose-Capillary: 158 mg/dL — ABNORMAL HIGH (ref 70–99)
Glucose-Capillary: 125 mg/dL — ABNORMAL HIGH (ref 70–99)
Glucose-Capillary: 172 mg/dL — ABNORMAL HIGH (ref 70–99)

## 2024-02-01 LAB — CBC
HCT: 30.6 % — ABNORMAL LOW (ref 36.0–46.0)
Hemoglobin: 10.2 g/dL — ABNORMAL LOW (ref 12.0–15.0)
MCH: 31.7 pg (ref 26.0–34.0)
MCHC: 33.3 g/dL (ref 30.0–36.0)
MCV: 95 fL (ref 80.0–100.0)
Platelets: 161 10*3/uL (ref 150–400)
RBC: 3.22 MIL/uL — ABNORMAL LOW (ref 3.87–5.11)
RDW: 13.6 % (ref 11.5–15.5)
WBC: 7.9 10*3/uL (ref 4.0–10.5)
nRBC: 0 % (ref 0.0–0.2)

## 2024-02-01 LAB — MAGNESIUM: Magnesium: 2.1 mg/dL (ref 1.7–2.4)

## 2024-02-01 MED ORDER — OXYCODONE HCL 5 MG PO TABS
2.5000 mg | ORAL_TABLET | ORAL | Status: DC | PRN
  Administered 2024-02-01 – 2024-02-02 (×3): 2.5 mg via ORAL
  Filled 2024-02-01 (×3): qty 1

## 2024-02-01 NOTE — Progress Notes (Signed)
 Notifed Dr. Isidoro Donning, of the patient's son who is POA  wanted to speak to her regarding her plan care. They are considering rehab and rational of care. Also, notified her that the caregiver in the room is not POA, just a sitter.

## 2024-02-01 NOTE — Progress Notes (Signed)
 [facility]                                                                              Christina Sellers, is a 83 y.o. female, DOB - 06/23/1941, PXT:[id number] Admit date - 01/29/2024    Outpatient Primary MD for the patient is Thana Ates, MD  LOS - 3  days  Chief Complaint  Patient presents with   Flank Pain       Brief summary   Patient is a 83 year old female with dementia, osteoporosis, COPD/asthma, DM-2, PVD, carotid artery stenosis, gout, osteoarthritis, DJD, HTN, hypothyroidism and diminished hearing brought to ED by EMS after she fell and hit her lower back on right side while ambulating to the bathroom, and admitted with chest wall trauma with multiple ribs fractures (7th through 9th) and small traumatic right-sided pneumothorax.     In ED, she was hypertensive.  Other vital stable.  WBC 13.3.  CK normal.  K3.1.  CO2 17.  AG 17.  Glucose 67. Cr 2.31 (baseline 1-1.2).  Otherwise CMP and CBC without significant finding.  CT head and CT cervical spine without acute finding but stable degenerative disease, bulky calcified cervical carotid artery sclerosis.  CT chest/abdomen/pelvis showed moderate right pneumothorax with patchy airspace opacity in the dependent right lung base and acute fracture of right 7th through 9th ribs, 8 to 9 mm low-density water density lesion in pancreatic tail, left femoral AVN and aortic atherosclerosis.     Trauma surgery following.  Subsequent x-ray with improved pneumothorax. Likely discharge home in the next 24 to 48 hours if pain under control and liberated off oxygen   Assessment & Plan    Mechanical fall at home  Chest wall trauma due to fall Multiple right 7 -9 rib fractures with right-sided pneumothorax due to fall - Fell while walking to the bathroom, no prodromal symptoms or syncope. -CT chest abdomen pelvis showed small to moderate right pneumothorax with patchy airspace opacity in the dependent right lung base  consistent with atelectasis.  Acute fractures of the right 7th through 9th ribs.  I -Subsequent x-ray with improved pneumothorax. -Trauma surgery following, continue aggressive pulmonary toilet, I-S, pain control, mobilize -Scheduled Tylenol with as needed oxycodone and Dilaudid for pain control -Fall precaution, PT/OT -Wean O2 as awaited   AKI superimposed on CKD stage IIIa -Baseline creatinine 1.1-1.3.  No obstruction noted on CT, CK normal -Hold Lasix, Aldactone, Jardiance -Creatinine plateaued at 2.3 -Improving, 1.7, continue to hold nephrotoxic meds  Diabetes mellitus type 2, NIDDM with hyperglycemia -Hemoglobin A1c 6.1 CBG (last 3)  Recent Labs    01/31/24 1632 01/31/24 2110 02/01/24 0716  GLUCAP 133* 126* 105*  -CBGs controlled, on heart healthy diet.   Hyperlipidemia -Hold Lipitor due to transaminitis  Dementia with behavioral disturbance:  - Had an episode of agitation/confusion the night of 2/23-24.  - CT head without acute finding.   -Continue memantine, delirium precautions  -No new FND's  -Decrease narcotics, noted to be somewhat tired and sleepy this morning.   Transaminitis, acute  -Improving, continue to hold statin  Essential hypertension:  - BP slightly elevated partly due to  pain. -Continue amlodipine, Coreg,  - continue to hold diuretics  Chronic back pain/osteoarthritis/degenerative disc disease -Pain control and PT/OT   Hypothyroidism: TSH normal. -Continue Synthroid   Bilateral carotid artery atherosclerosis Aortic atherosclerosis -Continue statin.   Mood disorder: Stable. -Continue home Lexapro   GERD -Continue home Protonix   Dysphagia? -Tolerating heart healthy diet, seen by SLP   History of osteoporosis: Seems like she is on Prolia injection every 6 months.   Pancreatic lesion:  -CT abdomen and pelvis showed 8 to 9 mm water density pancreatic tail lesion.  -Follow-up MRI abdomen in 6 months recommended   Estimated body  mass index is 21.61 kg/m as calculated from the following:   Height as of this encounter: 5' (1.524 m).   Weight as of this encounter: 50.2 kg.  Code Status: Full code DVT Prophylaxis:  SCDs Start: 01/29/24 1610   Level of Care: Level of care: Telemetry Medical Family Communication: Updated patient's son, Genevie Cheshire and daughter [name] on the phone.  I also called the patient's daughter Verlon Au however went to the voicemail. Disposition Plan:      Remains inpatient appropriate: Plan to DC home tomorrow if appears close to her baseline.   Procedures:    Consultants:     Antimicrobials:   Anti-infectives (From admission, onward)    None          Medications  acetaminophen  650 mg Oral Q6H   amLODipine  5 mg Oral BID   carvedilol  6.25 mg Oral BID WC   cycloSPORINE  1 drop Both Eyes BID   escitalopram  10 mg Oral QHS   feeding supplement  237 mL Oral BID BM   levothyroxine  75 mcg Oral q morning   memantine  5 mg Oral BID   pantoprazole  40 mg Oral BID   sucralfate  1 g Oral BID      Subjective:   Christina Sellers was seen and examined today.  Sitting up in the chair, somewhat lethargic, fatigued and irritable.  Caregiver at the bedside.  Received oxycodone 5 mg at 5:40 AM this morning .  No nausea vomiting, acute chest pain or shortness of breath.   Objective:   Vitals:   01/31/24 2018 01/31/24 2159 02/01/24 0430 02/01/24 0718  BP: (!) 145/61 (!) 142/50 (!) 163/59 (!) 156/59  Pulse: 66  (!) 59 (!) 58  Resp:    17  Temp:   98.2 F (36.8 C) 98 F (36.7 C)  TempSrc:   Oral Oral  SpO2:   93% 94%  Weight:      Height:        Intake/Output Summary (Last 24 hours) at 02/01/2024 1143 Last data filed at 02/01/2024 0913 Gross per 24 hour  Intake 600 ml  Output 450 ml  Net 150 ml     Wt Readings from Last 3 Encounters:  01/29/24 50.2 kg  01/19/24 50.3 kg  11/29/23 51.3 kg     Exam General: Alert and oriented to self, person/caregiver. Cardiovascular: S1  S2 auscultated,  RRR Respiratory: Clear to auscultation bilaterally, no wheezing Gastrointestinal: Soft, nontender, nondistended, + bowel sounds Ext: no pedal edema bilaterally Neuro: moving all 4 extremities spontaneously, sitting up in the chair Psych: somewhat lethargic, has dementia    Data Reviewed:  I have personally reviewed following labs    CBC Lab Results  Component Value Date   WBC 7.9 02/01/2024   RBC 3.22 (L) 02/01/2024   HGB 10.2 (L) 02/01/2024  HCT 30.6 (L) 02/01/2024   MCV 95.0 02/01/2024   MCH 31.7 02/01/2024   PLT 161 02/01/2024   MCHC 33.3 02/01/2024   RDW 13.6 02/01/2024   LYMPHSABS 1.2 01/29/2024   MONOABS 0.8 01/29/2024   EOSABS 0.1 01/29/2024   BASOSABS 0.1 01/29/2024     Last metabolic panel Lab Results  Component Value Date   NA 136 02/01/2024   K 3.7 02/01/2024   CL 105 02/01/2024   CO2 24 02/01/2024   BUN 27 (H) 02/01/2024   CREATININE 1.76 (H) 02/01/2024   GLUCOSE 108 (H) 02/01/2024   GFRNONAA 28 (L) 02/01/2024   GFRAA 55 (L) 10/12/2013   CALCIUM 8.9 02/01/2024   PHOS 3.3 01/29/2024   PROT 5.2 (L) 02/01/2024   ALBUMIN 2.7 (L) 02/01/2024   BILITOT 0.6 02/01/2024   ALKPHOS 115 02/01/2024   AST 29 02/01/2024   ALT 46 (H) 02/01/2024   ANIONGAP 7 02/01/2024    CBG (last 3)  Recent Labs    01/31/24 1632 01/31/24 2110 02/01/24 0716  GLUCAP 133* 126* 105*      Coagulation Profile: No results for input(s): "INR", "PROTIME" in the last 168 hours.   Radiology Studies: I have personally reviewed the imaging studies  DG CHEST PORT 1 VIEW Result Date: 01/31/2024 CLINICAL DATA:  [id number] Pneumothorax, right [id number] EXAM: PORTABLE CHEST 1 VIEW COMPARISON:  01/30/2024 FINDINGS: Stable cardiomegaly. Trace right apical pneumothorax, slightly improved. Persistent right basilar opacity with possible small effusion. Left lung remains clear. IMPRESSION: 1. Trace right apical pneumothorax, slightly improved. 2. Persistent right basilar opacity  with possible small effusion. Electronically Signed   By: Duanne Guess D.O.   On: 01/31/2024 09:47       Ova Gillentine M.D. [facility] 02/01/2024, 11:43 AM  Available via Epic secure chat 7am-7pm After 7 pm, please refer to night coverage provider listed on amion.

## 2024-02-01 NOTE — Progress Notes (Signed)
 Mobility Specialist Progress Note;   02/01/24 1420  Mobility  Activity  (Bed level exercises)  Level of Assistance Contact guard assist, steadying assist  Assistive Device None  Range of Motion/Exercises Active Assistive;Right leg;Left leg  Activity Response Tolerated well  Mobility Referral Yes  Mobility visit 1 Mobility  Mobility Specialist Start Time (ACUTE ONLY) 1420  Mobility Specialist Stop Time (ACUTE ONLY) 1435  Mobility Specialist Time Calculation (min) (ACUTE ONLY) 15 min   Pt has sat up in chair today and requested to stay in bed d/t pain in rib area, agreeable to bed level exercises while daughter present in room. Actively assisted pt in bed level exercises that she can perform herself while in bed. VSS on 2LO2 while in room. Pt left comfortably in bed with all needs met. Daughter in room.   Caesar Bookman Mobility Specialist Please contact via SecureChat or [employer] [phone]

## 2024-02-01 NOTE — TOC Transition Note (Signed)
 Transition of Care [facility]) - Discharge Note   Patient Details  Name: Christina Sellers MRN: [id number] Date of Birth: 10/16/1941  Transition of Care [facility]) CM/SW Contact:  Gala Lewandowsky, RN Phone Number: 02/01/2024, 1:49 PM   Clinical Narrative: Case Manager spoke with patient and daughter Verlon Au at the bedside regarding home health services. Daughter states that the patient has used [facility] in the past and wants to use them again-patient is agreeable. Referral submitted to [facility] for PT/OT and start of care to begin within 24-48 hours post transition home. Patient will continue with 24/7 supervision via caregivers. Per MD, plan to transition home on 02-02-24. No further needs identified at this time.        Final next level of care: Home w Home Health Services Barriers to Discharge: No Barriers Identified   Patient Goals and CMS Choice Patient states their goals for this hospitalization and ongoing recovery are:: plan to return home with 24/7 supervision.  Discharge Plan and Services Additional resources added to the After Visit Summary for   In-house Referral: NA Discharge Planning Services: CM Consult Post Acute Care Choice: Home Health             HH Arranged: PT, OT [facility] Agency: [facility] ([facility] Date HH Agency Contacted: 02/01/24 Time HH Agency Contacted: 1330 Representative spoke with at [facility] Agency: Adele Dan  Social Drivers of Health (SDOH) Interventions SDOH Screenings   Food Insecurity: No Food Insecurity (01/29/2024)  Housing: Low Risk  (01/29/2024)  Transportation Needs: No Transportation Needs (01/29/2024)  Utilities: Not At Risk (07/15/2023)  Alcohol Screen: Low Risk  (07/15/2023)  Financial Resource Strain: Low Risk  (07/15/2023)  Physical Activity: Inactive (07/15/2023)  Tobacco Use: Medium Risk (01/29/2024)   Readmission Risk Interventions     No data to display

## 2024-02-01 NOTE — Plan of Care (Signed)
 Will continue to monitor.

## 2024-02-01 NOTE — Progress Notes (Signed)
 SLP Cancellation Note  Patient Details Name: Christina Sellers MRN: [id number] DOB: 1941/11/23   Cancelled treatment:       Reason Eval/Treat Not Completed: Other (comment)  New BSE ordered for this patient. SLP presented to room where patient was in bed (was drowsy from recent pain medication), her son and her daughter Verlon Au). Verlon Au reported that patient has been doing well with having her medications crushed in puree and she feels this should continue during hospital stay and at home. She denied any change in swallow function or any new concerns for dysphagia. Patient's son indicated that patient's caregivers at home have intermittently crushed her medication in purees since at least last admission four weeks ago. He also denies any changes or new concerns in patient's swallowing. Patient unable to safely take PO's at this time as she is very drowsy. She had no complaints, but she has dementia and is very HOH. SLP to s/o at this time.    Angela Nevin, MA, CCC-SLP Speech Therapy

## 2024-02-01 NOTE — Progress Notes (Signed)
 Occupational Therapy Treatment Patient Details Name: Christina Sellers MRN: [id number] DOB: 09/14/1941 Today's Date: 02/01/2024   History of present illness Pt is an 83 y/o F presenting to ED on 2/23 after fall hitting lower back at home. CT chest, wtih R 7-9 rib fxs and PNX. CT head and C spine negative. PMH includes heart failure, COPD, DM2, CKD, osteopenia   OT comments  Patient received in supine with caregiver/private sitter present. Patient able to get to EOB with increased time and CGA. Patient was min assist to donn gown to cover back and to transfer to recliner with RW. Patient performed grooming seated in recliner before being setup for breakfast. Acute OT to continue to follow to address established goals to facilitate safe DC.       If plan is discharge home, recommend the following:  A little help with walking and/or transfers;A little help with bathing/dressing/bathroom;Assistance with cooking/housework;Direct supervision/assist for financial management;Direct supervision/assist for medications management;Assist for transportation;Help with stairs or ramp for entrance   Equipment Recommendations  BSC/3in1    Recommendations for Other Services      Precautions / Restrictions Precautions Precautions: Fall Recall of Precautions/Restrictions: Impaired Precaution/Restrictions Comments: watch O2 Restrictions Weight Bearing Restrictions Per Provider Order: No       Mobility Bed Mobility Overal bed mobility: Needs Assistance Bed Mobility: Supine to Sit     Supine to sit: Contact guard, Used rails     General bed mobility comments: increased time and verbal cues with CGA with trunk    Transfers Overall transfer level: Needs assistance Equipment used: Rolling walker (2 wheels) Transfers: Sit to/from Stand, Bed to chair/wheelchair/BSC Sit to Stand: Contact guard assist, From elevated surface     Step pivot transfers: Min assist     General transfer comment: cues  for hand placement and min assist for walker management     Balance Overall balance assessment: Needs assistance Sitting-balance support: Feet supported, No upper extremity supported Sitting balance-Leahy Scale: Fair Sitting balance - Comments: supervision EOB   Standing balance support: During functional activity, Reliant on assistive device for balance, Bilateral upper extremity supported Standing balance-Leahy Scale: Poor Standing balance comment: reliant on BUE support                           ADL either performed or assessed with clinical judgement   ADL Overall ADL's : Needs assistance/impaired Eating/Feeding: Set up;Sitting   Grooming: Wash/dry hands;Wash/dry face;Set up;Sitting           Upper Body Dressing : Minimal assistance Upper Body Dressing Details (indicate cue type and reason): donn gown for back     Toilet Transfer: Minimal assistance;Rolling walker (2 wheels) Toilet Transfer Details (indicate cue type and reason): simulated to recliner with cues for hand placement         Functional mobility during ADLs: Minimal assistance;Rolling walker (2 wheels)      Extremity/Trunk Assessment              Vision       Perception     Praxis     Communication Communication Communication: Impaired Factors Affecting Communication: Hearing impaired   Cognition Arousal: Alert Behavior During Therapy: WFL for tasks assessed/performed               OT - Cognition Comments: HOH, follows directions with increased time                 Following commands: Intact  Cueing   Cueing Techniques: Verbal cues, Gestural cues  Exercises      Shoulder Instructions       General Comments 2 liters O2 SpO2 95% BP 156/59(86) Caregiver, private sitter present    Pertinent Vitals/ Pain       Pain Assessment Pain Assessment: Faces Faces Pain Scale: Hurts little more Pain Location: R side Pain Descriptors / Indicators:  Discomfort, Guarding Pain Intervention(s): Monitored during session, Repositioned  Home Living                                          Prior Functioning/Environment              Frequency  Min 1X/week        Progress Toward Goals  OT Goals(current goals can now be found in the care plan section)  Progress towards OT goals: Progressing toward goals  Acute Rehab OT Goals Patient Stated Goal: none stated OT Goal Formulation: With patient Time For Goal Achievement: 02/13/24 Potential to Achieve Goals: Good ADL Goals Pt Will Perform Upper Body Dressing: with supervision;sitting Pt Will Perform Lower Body Dressing: with supervision;sit to/from stand;sitting/lateral leans Pt Will Transfer to Toilet: with supervision;ambulating;regular height toilet Pt Will Perform Tub/Shower Transfer: with supervision;ambulating;Shower transfer;shower seat Additional ADL Goal #1: Pt will tolerate OOB activity x10 min with SpO2 90% in order to improve activity tolerance for ADLs.  Plan      Co-evaluation                 AM-PAC OT "6 Clicks" Daily Activity     Outcome Measure   Help from another person eating meals?: A Little Help from another person taking care of personal grooming?: A Little Help from another person toileting, which includes using toliet, bedpan, or urinal?: A Little Help from another person bathing (including washing, rinsing, drying)?: A Lot Help from another person to put on and taking off regular upper body clothing?: A Little Help from another person to put on and taking off regular lower body clothing?: A Lot 6 Click Score: 16    End of Session Equipment Utilized During Treatment: Gait belt;Rolling walker (2 wheels);Oxygen  OT Visit Diagnosis: Unsteadiness on feet (R26.81);Other abnormalities of gait and mobility (R26.89);Muscle weakness (generalized) (M62.81);History of falling (Z91.81)   Activity Tolerance Patient tolerated treatment  well   Patient Left in chair;with call bell/phone within reach;with nursing/sitter in room   Nurse Communication Mobility status        Time: 1478-2956 OT Time Calculation (min): 25 min  Charges: OT General Charges $OT Visit: 1 Visit OT Treatments $Self Care/Home Management : 8-22 mins $Therapeutic Activity: 8-22 mins  Alfonse Flavors, OTA Acute Rehabilitation Services  Office [phone]   Dewain Penning 02/01/2024, 8:23 AM

## 2024-02-02 ENCOUNTER — Ambulatory Visit (HOSPITAL_BASED_OUTPATIENT_CLINIC_OR_DEPARTMENT_OTHER): Payer: Medicare Other

## 2024-02-02 DIAGNOSIS — S299XXA Unspecified injury of thorax, initial encounter: Secondary | ICD-10-CM | POA: Diagnosis not present

## 2024-02-02 DIAGNOSIS — S270XXA Traumatic pneumothorax, initial encounter: Secondary | ICD-10-CM | POA: Diagnosis not present

## 2024-02-02 DIAGNOSIS — W19XXXA Unspecified fall, initial encounter: Secondary | ICD-10-CM | POA: Diagnosis not present

## 2024-02-02 DIAGNOSIS — S2241XA Multiple fractures of ribs, right side, initial encounter for closed fracture: Secondary | ICD-10-CM | POA: Diagnosis not present

## 2024-02-02 LAB — GLUCOSE, CAPILLARY
Glucose-Capillary: 121 mg/dL — ABNORMAL HIGH (ref 70–99)
Glucose-Capillary: 245 mg/dL — ABNORMAL HIGH (ref 70–99)
Glucose-Capillary: 156 mg/dL — ABNORMAL HIGH (ref 70–99)
Glucose-Capillary: 127 mg/dL — ABNORMAL HIGH (ref 70–99)

## 2024-02-02 MED ORDER — OXYCODONE HCL 5 MG PO TABS
2.5000 mg | ORAL_TABLET | Freq: Four times a day (QID) | ORAL | Status: DC | PRN
  Administered 2024-02-02 (×2): 2.5 mg via ORAL
  Filled 2024-02-02 (×2): qty 1

## 2024-02-02 MED ORDER — POLYETHYLENE GLYCOL 3350 17 G PO PACK
17.0000 g | PACK | Freq: Every day | ORAL | Status: DC
  Administered 2024-02-02 – 2024-02-03 (×2): 17 g via ORAL
  Filled 2024-02-02 (×2): qty 1

## 2024-02-02 NOTE — Progress Notes (Addendum)
 [facility]                                                                              Christina Sellers, is a 83 y.o. female, DOB - Nov 09, 1941, [id number] Admit date - 01/29/2024    Outpatient Primary MD for the patient is Thana Ates, MD  LOS - 4  days  Chief Complaint  Patient presents with   Flank Pain       Brief summary   Patient is a 83 year old female with dementia, osteoporosis, COPD/asthma, DM-2, PVD, carotid artery stenosis, gout, osteoarthritis, DJD, HTN, hypothyroidism and diminished hearing brought to ED by EMS after she fell and hit her lower back on right side while ambulating to the bathroom, and admitted with chest wall trauma with multiple ribs fractures (7th through 9th) and small traumatic right-sided pneumothorax.     In ED, she was hypertensive.  Other vital stable.  WBC 13.3.  CK normal.  K3.1.  CO2 17.  AG 17.  Glucose 67. Cr 2.31 (baseline 1-1.2).  Otherwise CMP and CBC without significant finding.  CT head and CT cervical spine without acute finding but stable degenerative disease, bulky calcified cervical carotid artery sclerosis.  CT chest/abdomen/pelvis showed moderate right pneumothorax with patchy airspace opacity in the dependent right lung base and acute fracture of right 7th through 9th ribs, 8 to 9 mm low-density water density lesion in pancreatic tail, left femoral AVN and aortic atherosclerosis.     Trauma surgery following.  Subsequent x-ray with improved pneumothorax. Likely discharge home in the next 24 to 48 hours if pain under control and liberated off oxygen   Assessment & Plan    Mechanical fall at home  Chest wall trauma due to fall Multiple right 7 -9 rib fractures with right-sided pneumothorax due to fall - Fell while walking to the bathroom, no prodromal symptoms or syncope. -CT chest abdomen pelvis showed small to moderate right pneumothorax with patchy airspace opacity in the dependent right lung base  consistent with atelectasis.  Acute fractures of the right 7th through 9th ribs.   -Subsequent x-ray with improved pneumothorax. -Trauma surgery following, continue aggressive pulmonary toilet, I-S, pain control, mobilize -Continue scheduled Tylenol and Robaxin as needed. -  Will hold off on oxycodone, dose was decreased to 2.5 mg yesterday however still appears to be lethargic this morning, had received 2.5 mg oxycodone at 5:37 AM -Fall precaution, PT/OT -Wean O2 as awaited   AKI superimposed on CKD stage IIIa -Baseline creatinine 1.1-1.3.  No obstruction noted on CT, CK normal -Hold Lasix, Aldactone, Jardiance -Creatinine plateaued at 2.3 -Improving, check bmet  Diabetes mellitus type 2, NIDDM with hyperglycemia -Hemoglobin A1c 6.1 CBG (last 3)  Recent Labs    02/01/24 1645 02/01/24 2113 02/02/24 0805  GLUCAP 125* 172* 121*  -CBGs controlled, on heart healthy diet.   Hyperlipidemia -Hold Lipitor due to transaminitis  Dementia with behavioral disturbance:  - Had an episode of agitation/confusion the night of 2/23-24.  - CT head without acute finding.   -Continue memantine, delirium precautions  -No new FND's  -Decreased oxycodone yesterday however still somewhat lethargic this morning.  Transaminitis, acute  -Improving, continue to hold statin -LFTs in a.m.  Essential hypertension:  - BP slightly elevated partly due to pain. -Continue amlodipine, Coreg,  - continue to hold diuretics  Chronic back pain/osteoarthritis/degenerative disc disease -Pain control and PT/OT   Hypothyroidism: TSH normal. -Continue Synthroid   Bilateral carotid artery atherosclerosis Aortic atherosclerosis -Continue statin.   Mood disorder: Stable. -Continue home Lexapro   GERD -Continue home Protonix   Dysphagia? -Tolerating heart healthy diet, seen by SLP   History of osteoporosis: Seems like she is on Prolia injection every 6 months.   Pancreatic lesion:  -CT abdomen  and pelvis showed 8 to 9 mm water density pancreatic tail lesion.  -Follow-up MRI abdomen in 6 months recommended   Estimated body mass index is 21.61 kg/m as calculated from the following:   Height as of this encounter: 5' (1.524 m).   Weight as of this encounter: 50.2 kg.  Code Status: Full code DVT Prophylaxis:  SCDs Start: 01/29/24 9604   Level of Care: Level of care: Telemetry Medical Family Communication: Updated patient's son, Genevie Cheshire and daughter [name] on the phone and updated patient's daughter, Verlon Au on the phone today. Disposition Plan:      Remains inpatient appropriate: Plan to DC home in next 24 to 48 hours if continues to improve   Procedures:    Consultants:     Antimicrobials:   Anti-infectives (From admission, onward)    None          Medications  acetaminophen  650 mg Oral Q6H   amLODipine  5 mg Oral BID   carvedilol  6.25 mg Oral BID WC   cycloSPORINE  1 drop Both Eyes BID   escitalopram  10 mg Oral QHS   feeding supplement  237 mL Oral BID BM   levothyroxine  75 mcg Oral q morning   memantine  5 mg Oral BID   pantoprazole  40 mg Oral BID   sucralfate  1 g Oral BID      Subjective:   Christina Sellers was seen and examined today.  Resting in the bed, appears to be comfortable with no acute pain issues however noted to be lethargic.  Had received oxycodone 2.5 mg this morning.     Objective:   Vitals:   02/01/24 1947 02/01/24 2205 02/02/24 0530 02/02/24 0748  BP: (!) 159/53 (!) 157/55 (!) 159/68 (!) 161/66  Pulse: 68  61 (!) 58  Resp: 18  12 15   Temp: 98.2 F (36.8 C)  98.4 F (36.9 C)   TempSrc: Oral  Oral   SpO2: 92%  94% 92%  Weight:      Height:        Intake/Output Summary (Last 24 hours) at 02/02/2024 1255 Last data filed at 02/02/2024 1045 Gross per 24 hour  Intake 120 ml  Output 900 ml  Net -780 ml     Wt Readings from Last 3 Encounters:  01/29/24 50.2 kg  01/19/24 50.3 kg  11/29/23 51.3 kg    Physical  Exam General: Somewhat lethargic but easily arousable and responsive to verbal commands and questions.  Appears comfortable Cardiovascular: S1 S2 clear, RRR.  Respiratory: CTAB, no wheezing Gastrointestinal: Soft, nontender, nondistended, NBS Ext: no pedal edema bilaterally Neuro: no new deficits Psych: somewhat lethargic  Data Reviewed:  I have personally reviewed following labs    CBC Lab Results  Component Value Date   WBC 7.9 02/01/2024   RBC 3.22 (L) 02/01/2024   HGB 10.2 (  L) 02/01/2024   HCT 30.6 (L) 02/01/2024   MCV 95.0 02/01/2024   MCH 31.7 02/01/2024   PLT 161 02/01/2024   MCHC 33.3 02/01/2024   RDW 13.6 02/01/2024   LYMPHSABS 1.2 01/29/2024   MONOABS 0.8 01/29/2024   EOSABS 0.1 01/29/2024   BASOSABS 0.1 01/29/2024     Last metabolic panel Lab Results  Component Value Date   NA 136 02/01/2024   K 3.7 02/01/2024   CL 105 02/01/2024   CO2 24 02/01/2024   BUN 27 (H) 02/01/2024   CREATININE 1.76 (H) 02/01/2024   GLUCOSE 108 (H) 02/01/2024   GFRNONAA 28 (L) 02/01/2024   GFRAA 55 (L) 10/12/2013   CALCIUM 8.9 02/01/2024   PHOS 3.3 01/29/2024   PROT 5.2 (L) 02/01/2024   ALBUMIN 2.7 (L) 02/01/2024   BILITOT 0.6 02/01/2024   ALKPHOS 115 02/01/2024   AST 29 02/01/2024   ALT 46 (H) 02/01/2024   ANIONGAP 7 02/01/2024    CBG (last 3)  Recent Labs    02/01/24 1645 02/01/24 2113 02/02/24 0805  GLUCAP 125* 172* 121*      Coagulation Profile: No results for input(s): "INR", "PROTIME" in the last 168 hours.   Radiology Studies: I have personally reviewed the imaging studies  No results found.      Thad Ranger M.D. [facility] 02/02/2024, 12:55 PM  Available via Epic secure chat 7am-7pm After 7 pm, please refer to night coverage provider listed on amion.

## 2024-02-02 NOTE — Progress Notes (Signed)
 Physical Therapy Treatment Patient Details Name: Christina Sellers MRN: [id number] DOB: [birth year] Today's Date: 02/02/2024   History of Present Illness Pt is an 83 y/o F presenting to ED on 2/23 after fall hitting lower back at home. CT chest, wtih R 7-9 rib fxs and PNX. CT head and C spine negative. PMH includes heart failure, COPD, DM2, CKD, osteopenia    PT Comments  Pt supine in bed with daughter present, agreeable to PT session. She ambulated to/from the bathroom using RW with CGA and performed multiple STS from various surface heights with minA. Attempted to titrate O2 to RA but pt desaturated to 85% with activity requiring 1L O2 via [place]. Her primary limiting factor is R flank pain. At the end of the session pt became nauseous and experienced emesis, RN notified. Will continue to follow acutely and advance appropriately.    If plan is discharge home, recommend the following: A little help with walking and/or transfers;A little help with bathing/dressing/bathroom;Assistance with cooking/housework;Direct supervision/assist for medications management;Direct supervision/assist for financial management;Assist for transportation;Help with stairs or ramp for entrance;Supervision due to cognitive status   Can travel by private vehicle        Equipment Recommendations  None recommended by PT    Recommendations for Other Services       Precautions / Restrictions Precautions Precautions: Fall Recall of Precautions/Restrictions: Impaired Precaution/Restrictions Comments: watch O2 Restrictions Weight Bearing Restrictions Per Provider Order: No     Mobility  Bed Mobility Overal bed mobility: Needs Assistance Bed Mobility: Supine to Sit, Sit to Supine     Supine to sit: Min assist, HOB elevated, Used rails Sit to supine: HOB elevated, Mod assist   General bed mobility comments: Pt took increased time to sit up on R side of bed, minA via bed pad to scoot pt to EOB. Fatigued at end of  session required modA to return to bed and reposition using bedpad and bed features.    Transfers Overall transfer level: Needs assistance Equipment used: Rolling walker (2 wheels) Transfers: Sit to/from Stand, Bed to chair/wheelchair/BSC Sit to Stand: Min assist   Step pivot transfers: Min assist       General transfer comment: STS from lowest bed height, commode, and recliner chair x5 reps. Pt demonstrated proper hand placement on RW. She required minA to power up. Chair>bed transfer to L, pt took short step and required VC for proper sequencing. Good eccentric control with sitting.    Ambulation/Gait Ambulation/Gait assistance: Contact guard assist Gait Distance (Feet): 15 Feet (1x15, seated rest, 1x10, seated rest 1x3) Assistive device: Rolling walker (2 wheels) Gait Pattern/deviations: Step-to pattern, Narrow base of support, Trunk flexed, Decreased stride length Gait velocity: Decreased Gait velocity interpretation: <1.31 ft/sec, indicative of household ambulator   General Gait Details: Pt took short small steps with minimal floot clearence, increased fwd flex, and RW too far in front on her. VC/TC to correct posture and positioning, no adjustments observed. She ambulated at a slow and cautious speed.   Stairs             Wheelchair Mobility     Tilt Bed    Modified Rankin (Stroke Patients Only)       Balance Overall balance assessment: Needs assistance Sitting-balance support: Feet supported, No upper extremity supported Sitting balance-Leahy Scale: Fair Sitting balance - Comments: Pt sat EOB with supervision. On the commode pt reached outside her BOS to get toilet pattern and address pericare.   Standing balance support: During functional activity,  Reliant on assistive device for balance, Bilateral upper extremity supported Standing balance-Leahy Scale: Poor Standing balance comment: Pt is dependent on RW in standing. Required PT to doff/don panties.                             Communication Communication Communication: Impaired Factors Affecting Communication: Hearing impaired  Cognition Arousal: Alert Behavior During Therapy: WFL for tasks assessed/performed   PT - Cognitive impairments: History of cognitive impairments ([place])                         Following commands: Impaired Following commands impaired: Follows one step commands with increased time    Cueing Cueing Techniques: Verbal cues  Exercises      General Comments General comments (skin integrity, edema, etc.): Pt greeted on RA as she had just blown her nose. At rest she began to desat to 87% which could be recovered with PLB. During activity she desaturated to 85% that required 1L O2 via [place] to maintain SpO2 >90%. Once in recliner chair able to decrease to 0.5L with SpO2 at 97%. Following repositioning in bed pt reported feeling nauseous and began throwing up. Called for nursing and they took over care for the pt.      Pertinent Vitals/Pain Pain Assessment Pain Assessment: 0-10 Pain Score: 8  Pain Location: R side Pain Descriptors / Indicators: Discomfort, Guarding, Tender Pain Intervention(s): Monitored during session, Limited activity within patient's tolerance    Home Living                          Prior Function            PT Goals (current goals can now be found in the care plan section) Acute Rehab PT Goals Patient Stated Goal: Return Home Progress towards PT goals: Progressing toward goals    Frequency    Min 1X/week      PT Plan      Co-evaluation              AM-PAC PT "6 Clicks" Mobility   Outcome Measure  Help needed turning from your back to your side while in a flat bed without using bedrails?: A Lot Help needed moving from lying on your back to sitting on the side of a flat bed without using bedrails?: A Lot Help needed moving to and from a bed to a chair (including a wheelchair)?: A  Little Help needed standing up from a chair using your arms (e.g., wheelchair or bedside chair)?: A Little Help needed to walk in hospital room?: A Little Help needed climbing 3-5 steps with a railing? : A Lot 6 Click Score: 15    End of Session Equipment Utilized During Treatment: Gait belt;Oxygen Activity Tolerance: Patient limited by pain Patient left: in bed;with call bell/phone within reach;with nursing/sitter in room;with family/visitor present Nurse Communication: Mobility status;Other (comment) (Pt's emesis) PT Visit Diagnosis: Muscle weakness (generalized) (M62.81);History of falling (Z91.81);Unsteadiness on feet (R26.81);Difficulty in walking, not elsewhere classified (R26.2);Pain Pain - Right/Left: Right Pain - part of body: Arm;Shoulder (Flank)     Time: 1610-9604 PT Time Calculation (min) (ACUTE ONLY): 43 min  Charges:    $Gait Training: 23-37 mins $Therapeutic Activity: 8-22 mins PT General Charges $$ ACUTE PT VISIT: 1 Visit  Cheri Guppy, PT, DPT Acute Rehabilitation Services Office: [phone] Secure Chat Preferred  Richardson Chiquito 02/02/2024, 3:20 PM

## 2024-02-02 NOTE — Progress Notes (Signed)
 SATURATION QUALIFICATIONS: (This note is used to comply with regulatory documentation for home oxygen)  Patient Saturations on Room Air at Rest = 88%  Patient Saturations on Room Air while Ambulating = 85%  Patient Saturations on 1 Liters of oxygen while Ambulating = 90%  Please briefly explain why patient needs home oxygen: Pt desaturated with activity while on RA unable to recover with PLB strategies. She currently requires supplemental oxygen to maintain SpO2 >88%.   Cheri Guppy, PT, DPT Acute Rehabilitation Services Office: [phone] Secure Chat Preferred

## 2024-02-02 NOTE — Care Management Important Message (Signed)
 Important Message  Patient Details  Name: Suan Pyeatt MRN: [id number] Date of Birth: [birth year]   Important Message Given:  Yes - Medicare IM     Renie Ora 02/02/2024, 11:05 AM

## 2024-02-02 NOTE — Plan of Care (Signed)
 Will continue to monitor patient.

## 2024-02-02 NOTE — Plan of Care (Signed)

## 2024-02-03 ENCOUNTER — Inpatient Hospital Stay (HOSPITAL_COMMUNITY): Payer: Medicare Other

## 2024-02-03 ENCOUNTER — Other Ambulatory Visit (HOSPITAL_COMMUNITY): Payer: Self-pay

## 2024-02-03 DIAGNOSIS — S299XXA Unspecified injury of thorax, initial encounter: Secondary | ICD-10-CM | POA: Diagnosis not present

## 2024-02-03 DIAGNOSIS — I10 Essential (primary) hypertension: Secondary | ICD-10-CM | POA: Diagnosis not present

## 2024-02-03 DIAGNOSIS — S2241XA Multiple fractures of ribs, right side, initial encounter for closed fracture: Secondary | ICD-10-CM | POA: Diagnosis not present

## 2024-02-03 DIAGNOSIS — R569 Unspecified convulsions: Secondary | ICD-10-CM | POA: Diagnosis not present

## 2024-02-03 DIAGNOSIS — W19XXXA Unspecified fall, initial encounter: Secondary | ICD-10-CM | POA: Diagnosis not present

## 2024-02-03 LAB — COMPREHENSIVE METABOLIC PANEL
ALT: 30 U/L (ref 0–44)
AST: 21 U/L (ref 15–41)
Albumin: 3.1 g/dL — ABNORMAL LOW (ref 3.5–5.0)
Alkaline Phosphatase: 114 U/L (ref 38–126)
Anion gap: 10 (ref 5–15)
BUN: 27 mg/dL — ABNORMAL HIGH (ref 8–23)
CO2: 23 mmol/L (ref 22–32)
Calcium: 10 mg/dL (ref 8.9–10.3)
Chloride: 104 mmol/L (ref 98–111)
Creatinine, Ser: 1.6 mg/dL — ABNORMAL HIGH (ref 0.44–1.00)
GFR, Estimated: 32 mL/min — ABNORMAL LOW (ref >60)
Glucose, Bld: 109 mg/dL — ABNORMAL HIGH (ref 70–99)
Potassium: 4 mmol/L (ref 3.5–5.1)
Sodium: 137 mmol/L (ref 135–145)
Total Bilirubin: 0.7 mg/dL (ref 0.0–1.2)
Total Protein: 6.5 g/dL (ref 6.5–8.1)

## 2024-02-03 LAB — CBC
HCT: 40.8 % (ref 36.0–46.0)
Hemoglobin: 13.6 g/dL (ref 12.0–15.0)
MCH: 31.2 pg (ref 26.0–34.0)
MCHC: 33.3 g/dL (ref 30.0–36.0)
MCV: 93.6 fL (ref 80.0–100.0)
Platelets: 215 10*3/uL (ref 150–400)
RBC: 4.36 MIL/uL (ref 3.87–5.11)
RDW: 13.7 % (ref 11.5–15.5)
WBC: 8.4 10*3/uL (ref 4.0–10.5)
nRBC: 0 % (ref 0.0–0.2)

## 2024-02-03 LAB — GLUCOSE, CAPILLARY: Glucose-Capillary: 120 mg/dL — ABNORMAL HIGH (ref 70–99)

## 2024-02-03 MED ORDER — METHOCARBAMOL 500 MG PO TABS
250.0000 mg | ORAL_TABLET | Freq: Four times a day (QID) | ORAL | 0 refills | Status: DC | PRN
  Filled 2024-02-03: qty 30, 15d supply, fill #0

## 2024-02-03 MED ORDER — FUROSEMIDE 20 MG PO TABS: 20.0000 mg | ORAL_TABLET | Freq: Every day | ORAL | Status: DC

## 2024-02-03 MED ORDER — SENNOSIDES-DOCUSATE SODIUM 8.6-50 MG PO TABS
1.0000 | ORAL_TABLET | Freq: Two times a day (BID) | ORAL | 0 refills | Status: DC | PRN
  Filled 2024-02-03: qty 60, 30d supply, fill #0

## 2024-02-03 MED ORDER — ONDANSETRON HCL 4 MG PO TABS
4.0000 mg | ORAL_TABLET | Freq: Three times a day (TID) | ORAL | 0 refills | Status: DC | PRN
  Filled 2024-02-03: qty 30, 10d supply, fill #0

## 2024-02-03 MED ORDER — OXYCODONE HCL 5 MG PO TABS
2.5000 mg | ORAL_TABLET | Freq: Four times a day (QID) | ORAL | 0 refills | Status: DC | PRN
  Filled 2024-02-03: qty 14, 7d supply, fill #0

## 2024-02-03 MED ORDER — POLYETHYLENE GLYCOL 3350 17 GM/SCOOP PO POWD
17.0000 g | Freq: Every day | ORAL | 0 refills | Status: DC
  Filled 2024-02-03: qty 238, 14d supply, fill #0

## 2024-02-03 NOTE — Progress Notes (Signed)
 Per Nurse, Pt is now back from MRI. EEG enroute

## 2024-02-03 NOTE — Progress Notes (Signed)
 CT head reviewed, very tiny fluid collection on the right, maybe SDH? No neurosurgical intervention is needed. Could repeat head CT in AM, not even sure this was the cause of her "not acting" right given that it is so small and not seen on her original CT head.

## 2024-02-03 NOTE — Consult Note (Signed)
 Reason for Consult: Subdural hematoma Referring Physician: [facility] hospital[facility]  Christina Sellers is an 83 y.o. female.  HPI: 83 year old female who sustained a fall couple days ago was admitted with rib fractures had some mental status changes today per the son underwent MRI scan that showed a small subdural hematoma  Past Medical History:  Diagnosis Date   Abnormal Pap smear of vagina    Allergic sinusitis    Anxiety    Atherosclerotic peripheral vascular disease (HCC)    Carotid artery stenosis    Cellulitis    COPD with asthma (HCC)    not an issue now   Depression    Diabetes mellitus type 2, uncomplicated (HCC)    DJD (degenerative joint disease)    Elevated cholesterol    Elevated MCV    secondary to alchol   GERD (gastroesophageal reflux disease)    not an issue now.   Gout    Hearing difficulty    bilateral hearing aids   History of recurrent UTIs    Hypertension    loss weight'off meds now"   Hypothyroidism    IBS (irritable bowel syndrome)    Insomnia    Labial fusion    Osteoarthritis of left knee    Osteopenia    Recurrent vaginitis    Sjogren's syndrome (HCC)    Syncope 10/12/2013   secondary to dehydration and possible hypoglycemia   Tendonitis    Thyroid disease    Transfusion history    age 54 "anemia"   Vitamin D deficiency 05/13/2021    Past Surgical History:  Procedure Laterality Date   CATARACT EXTRACTION, BILATERAL Bilateral    CHOLECYSTECTOMY     open   COLONOSCOPY WITH PROPOFOL N/A 03/23/2016   Procedure: COLONOSCOPY WITH PROPOFOL;  Surgeon: Charolett Bumpers, MD;  Location: [facility];  Service: Endoscopy;  Laterality: N/A;   EYE SURGERY     FOOT SURGERY Left    retained hardware   KNEE SURGERY Left    scope    TONSILLECTOMY     TOTAL HIP ARTHROPLASTY Right 03/30/2021   Procedure: RIGHTTOTAL HIP ARTHROPLASTY ANTERIOR APPROACH;  Surgeon: Gean Birchwood, MD;  Location: [facility];  Service: Orthopedics;  Laterality: Right;    Family  History  Problem Relation Age of Onset   Diabetes Maternal Aunt    Cancer Father        Oral cancer   Breast cancer Sister        Age 19's   Cancer Brother        Lung cancer    Social History:  reports that she quit smoking about 41 years ago. Her smoking use included cigarettes. She has never used smokeless tobacco. She reports that she does not drink alcohol and does not use drugs.  Allergies:  Allergies  Allergen Reactions   Tape Other (See Comments)    SKIN IS VERY THIN- TAPE TEARS VERY EASILY!!   Celexa [Citalopram Hydrobromide] Other (See Comments)    Nightmares    Fluconazole Other (See Comments)    Unknown reaction   Green Tea (Camellia Sinensis)     Other Reaction(s): mouth ulcers   Green Tea Leaf Ext Other (See Comments)    Mouth ulcers   Hydralazine Hcl     Other Reaction(s): easy bruising   Kenalog [Triamcinolone] Other (See Comments)    Spinal injection caused sweats   Levaquin [Levofloxacin] Hives   Losartan Potassium-Hctz Other (See Comments)    Night sweats/ fatigue   Magnesium Citrate  Other (See Comments)   Methocarbamol Other (See Comments)    500 mg is too much- can tolerate a lesser dose   Nitrofurantoin Macrocrystal Other (See Comments)    Possible vertigo   Scopolamine Other (See Comments)    Memory change   Sulfa Antibiotics Other (See Comments)    Reaction not recalled by the patient   Tea     Other reaction(s): Not available   Tramadol     Makes pt feel loopy   Trimethoprim Hives    Medications: I have reviewed the patient's current medications.  Results for orders placed or performed during the hospital encounter of 01/29/24 (from the past 48 hours)  Glucose, capillary     Status: Abnormal   Collection Time: 02/01/24  4:45 PM  Result Value Ref Range   Glucose-Capillary 125 (H) 70 - 99 mg/dL    Comment: Glucose reference range applies only to samples taken after fasting for at least 8 hours.  Glucose, capillary     Status: Abnormal    Collection Time: 02/01/24  9:13 PM  Result Value Ref Range   Glucose-Capillary 172 (H) 70 - 99 mg/dL    Comment: Glucose reference range applies only to samples taken after fasting for at least 8 hours.  Glucose, capillary     Status: Abnormal   Collection Time: 02/02/24  8:05 AM  Result Value Ref Range   Glucose-Capillary 121 (H) 70 - 99 mg/dL    Comment: Glucose reference range applies only to samples taken after fasting for at least 8 hours.  Glucose, capillary     Status: Abnormal   Collection Time: 02/02/24 12:56 PM  Result Value Ref Range   Glucose-Capillary 245 (H) 70 - 99 mg/dL    Comment: Glucose reference range applies only to samples taken after fasting for at least 8 hours.  Glucose, capillary     Status: Abnormal   Collection Time: 02/02/24  4:10 PM  Result Value Ref Range   Glucose-Capillary 156 (H) 70 - 99 mg/dL    Comment: Glucose reference range applies only to samples taken after fasting for at least 8 hours.  Glucose, capillary     Status: Abnormal   Collection Time: 02/02/24  9:04 PM  Result Value Ref Range   Glucose-Capillary 127 (H) 70 - 99 mg/dL    Comment: Glucose reference range applies only to samples taken after fasting for at least 8 hours.  CBC     Status: None   Collection Time: 02/03/24  4:32 AM  Result Value Ref Range   WBC 8.4 4.0 - 10.5 K/uL   RBC 4.36 3.87 - 5.11 MIL/uL   Hemoglobin 13.6 12.0 - 15.0 g/dL    Comment: REPEATED TO VERIFY   HCT 40.8 36.0 - 46.0 %   MCV 93.6 80.0 - 100.0 fL   MCH 31.2 26.0 - 34.0 pg   MCHC 33.3 30.0 - 36.0 g/dL   RDW 40.9 81.1 - 91.4 %   Platelets 215 150 - 400 K/uL   nRBC 0.0 0.0 - 0.2 %    Comment: Performed at [facility], [address] [place], [place] [address]  Comprehensive metabolic panel     Status: Abnormal   Collection Time: 02/03/24  4:32 AM  Result Value Ref Range   Sodium 137 135 - 145 mmol/L   Potassium 4.0 3.5 - 5.1 mmol/L   Chloride 104 98 - 111 mmol/L   CO2 23 22 - 32 mmol/L    Glucose,  Bld 109 (H) 70 - 99 mg/dL    Comment: Glucose reference range applies only to samples taken after fasting for at least 8 hours.   BUN 27 (H) 8 - 23 mg/dL   Creatinine, Ser 1.61 (H) 0.44 - 1.00 mg/dL   Calcium 09.6 8.9 - 04.5 mg/dL   Total Protein 6.5 6.5 - 8.1 g/dL   Albumin 3.1 (L) 3.5 - 5.0 g/dL   AST 21 15 - 41 U/L   ALT 30 0 - 44 U/L   Alkaline Phosphatase 114 38 - 126 U/L   Total Bilirubin 0.7 0.0 - 1.2 mg/dL   GFR, Estimated 32 (L) >60 mL/min    Comment: (NOTE) Calculated using the CKD-EPI Creatinine Equation (2021)    Anion gap 10 5 - 15    Comment: Performed at [facility], [address], [place], [place] [address]  Glucose, capillary     Status: Abnormal   Collection Time: 02/03/24  7:56 AM  Result Value Ref Range   Glucose-Capillary 120 (H) 70 - 99 mg/dL    Comment: Glucose reference range applies only to samples taken after fasting for at least 8 hours.    EEG adult Result Date: 02/03/2024 Christina Quest, MD     02/03/2024 11:50 AM Patient Name: Christina Sellers MRN: [id number] Epilepsy Attending: Charlsie Sellers Referring Physician/Provider: Cathren Harsh, MD Date: 02/03/2024 Duration: 28.01 mins Patient history: 83yo F with ams. EEG to evaluate for seizure Level of alertness: Awake, asleep AEDs during EEG study: None Technical aspects: This EEG study was done with scalp electrodes positioned according to the 10-20 International system of electrode placement. Electrical activity was reviewed with band pass filter of 1-70Hz , sensitivity of 7 uV/mm, display speed of 46mm/sec with a 60Hz  notched filter applied as appropriate. EEG data were recorded continuously and digitally stored.  Video monitoring was available and reviewed as appropriate. Description: The posterior dominant rhythm consists of 7.5 Hz activity of moderate voltage (25-35 uV) seen predominantly in posterior head regions, symmetric and reactive to eye opening and eye closing. Sleep was  characterized by vertex waves, sleep spindles (12 to 14 Hz), maximal frontocentral region. EEG showed intermittent generalized 3 to 6 Hz theta-delta slowing. Hyperventilation and photic stimulation were not performed.   ABNORMALITY - Intermittent slow, generalized IMPRESSION: This study is suggestive of mild diffuse encephalopathy. No seizures or epileptiform discharges were seen throughout the recording. Christina Sellers   MR BRAIN WO CONTRAST Result Date: 02/03/2024 CLINICAL DATA:  Provided history: Mental status change, unknown cause. Seizure. EXAM: MRI HEAD WITHOUT CONTRAST TECHNIQUE: Multiplanar, multiecho pulse sequences of the brain and surrounding structures were obtained without intravenous contrast. COMPARISON:  Head CT 01/29/2024.  Brain MRI 10/03/2023. FINDINGS: Intermittently motion degraded examination. Most notably, the axial FLAIR sequence is moderate to severely motion degraded. Within this limitation, findings are as follows. Brain: Mild-to-moderate generalized cerebral atrophy. Mild cerebellar atrophy. Commensurate prominence of the ventricles and sulci. Thin subdural collection overlying the right cerebral hemisphere (measuring up to 2-3 mm in thickness) (for instance as seen on series 6, image 12). Multifocal T2 FLAIR hyperintense signal abnormality within the cerebral white matter, nonspecific but compatible with advanced chronic small vessel ischemic disease. To a lesser degree, chronic small vessel ischemic changes are also present within the pons. Small chronic infarct within the right cerebellar hemisphere. No cortical encephalomalacia is identified. No appreciable hippocampal size or signal asymmetry. There is no acute infarct. No evidence of an intracranial mass. No midline  shift. Vascular: Maintained flow voids within the proximal large arterial vessels. Skull and upper cervical spine: No focal worrisome marrow lesion. Sinuses/Orbits: No mass or acute finding within the imaged orbits.  Prior bilateral ocular lens replacement. Small fluid level within the left sphenoid sinus. Minimal mucosal thickening scattered within bilateral ethmoid air cells. Other: Right mastoid effusion. Impression #2 were called by telephone at the time of interpretation on 02/03/2024 at 10:25 am to provider Christina Sellers , who verbally acknowledged these results. IMPRESSION: 1. Intermittently motion degraded exam. 2. Thin subdural collection (likely subdural hematoma) overlying the right cerebral hemisphere, measuring up to 2-3 mm in thickness. 3. Advanced chronic small vessel ischemic disease, similar to the prior brain MRI of 10/03/2023. 4. Small chronic infarct within the right cerebellar hemisphere, unchanged. 5. Mild-to-moderate generalized cerebral atrophy. 6. Mild cerebellar atrophy. 7. Paranasal sinus disease as described. 8. Right mastoid effusion. Electronically Signed   By: Jackey Loge D.O.   On: 02/03/2024 10:26    Review of Systems  Unable to perform ROS: Mental status change   Blood pressure (!) 158/64, pulse (!) 56, temperature 98 F (36.7 C), temperature source Oral, resp. rate 16, height 5' (1.524 m), weight 50.2 kg, SpO2 96%. Physical Exam Neurological:     Comments: Patient is awake and confused oriented only x 1 not complaining of anything except for back pain     Assessment/Plan: 83 year old female admitted after a fall with rib fractures and MRI scan showing small subdural fluid collection this measures less than 2 mm certainly not causing any mass effect she has extensive cortical atrophy and ventriculomegaly ex vacuo there is no neurosurgical intervention needed here.  Would temporarily avoid anticoagulation for the next 72 hours probably was present on admission CT but the sensitivity of the MRI picked it up,a CT missed it.  Regardless it is clinically insignificant  Christina Sellers 02/03/2024, 12:57 PM

## 2024-02-03 NOTE — TOC Progression Note (Signed)
 Transition of Care [facility]) - Progression Note    Patient Details  Name: Christina Sellers MRN: [id number] Date of Birth: 1941-11-12  [facility] of [facility]) CM/SW Contact  Graves-Bigelow, Lamar Laundry, RN Phone Number: 02/03/2024, 12:40 PM  Clinical Narrative:  Case Manager has spoken with [facility] and the Liaison is  aware to call the daughter [name] to schedule visits up front. No further needs identified at this time.   Expected Discharge Plan: Home w Home Health Services Barriers to Discharge: No Barriers Identified  Expected Discharge Plan and Services In-house Referral: NA Discharge Planning Services: CM Consult Post Acute Care Choice: Home Health Living arrangements for the past 2 months: Single Family Home    HH Arranged: PT, OT HH Agency: [facility] ([facility]) Date HH Agency Contacted: 02/01/24 Time HH Agency Contacted: 1330 Representative spoke with at [facility] Agency: Adele Dan   Social Determinants of Health (SDOH) Interventions SDOH Screenings   Food Insecurity: No Food Insecurity (01/29/2024)  Housing: Low Risk  (01/29/2024)  Transportation Needs: No Transportation Needs (01/29/2024)  Utilities: Not At Risk (07/15/2023)  Alcohol Screen: Low Risk  (07/15/2023)  Financial Resource Strain: Low Risk  (07/15/2023)  Physical Activity: Inactive (07/15/2023)  Tobacco Use: Medium Risk (01/29/2024)    Readmission Risk Interventions     No data to display

## 2024-02-03 NOTE — Discharge Summary (Addendum)
 Physician Discharge Summary   Patient: Christina Sellers MRN: [id number] DOB: 1940-12-12  Admit date:     01/29/2024  Discharge date: 02/03/24  Discharge Physician: Thad Ranger, MD    PCP: Thana Ates, MD   Recommendations at discharge:   Resume Lasix on 02/05/2024 Hold spironolactone, Jardiance, obtain BMET in 1 week.  Patient recommended to check with cardiology when to resume these medications Home health PT OT, RN arranged by [facility] Fall precautions  Discharge Diagnoses:     Mechanical fall at home   Chest wall trauma with multiple rib fractures   Right-sided pneumothorax due to fall at home   Small subdural hematoma   Acute metabolic encephalopathy-improving   Type 2 diabetes mellitus with hyperglycemia, without long-term current use of insulin (HCC)   Hypothyroidism   Chronic low back pain   Aortic atherosclerosis (HCC)   Anxiety disorder   Chronic kidney disease, stage 3 unspecified (HCC)   Chronic obstructive pulmonary disease, unspecified (HCC)   Gastro-esophageal reflux disease without esophagitis   Obstructive sleep apnea syndrome   Bilateral carotid artery stenosis   Paroxysmal SVT (supraventricular tachycardia) (HCC) Generalized debility   Hospital Course:  Patient is a 83 year old female with dementia, osteoporosis, COPD/asthma, DM-2, PVD, carotid artery stenosis, gout, osteoarthritis, DJD, HTN, hypothyroidism and diminished hearing brought to ED by EMS after she fell and hit her lower back on right side while ambulating to the bathroom, and admitted with chest wall trauma with multiple ribs fractures (7th through 9th) and small traumatic right-sided pneumothorax.     In ED, she was hypertensive.  Other vital stable.  WBC 13.3.  CK normal.  K3.1.  CO2 17.  AG 17.  Glucose 67. Cr 2.31 (baseline 1-1.2).  Otherwise CMP and CBC without significant finding.  CT head and CT cervical spine without acute finding but stable degenerative disease, bulky calcified cervical  carotid artery sclerosis.  CT chest/abdomen/pelvis showed moderate right pneumothorax with patchy airspace opacity in the dependent right lung base and acute fracture of right 7th through 9th ribs, 8 to 9 mm low-density water density lesion in pancreatic tail, left femoral AVN and aortic atherosclerosis.     Trauma surgery following.  Subsequent x-ray with improved pneumothorax.   Assessment and Plan:  Mechanical fall at home  Chest wall trauma due to fall Multiple right 7 -9 rib fractures with right-sided pneumothorax due to fall - Fell while walking to the bathroom, no prodromal symptoms or syncope. -CT chest abdomen pelvis showed small to moderate right pneumothorax with patchy airspace opacity in the dependent right lung base consistent with atelectasis.  Acute fractures of the right 7th through 9th ribs.   -Subsequent x-ray with improved pneumothorax. -Trauma surgery was consulted and closely followed the patient.  Recommended aggressive pulmonary toilet, I-S, pain control, mobilize.  -Continue Tylenol, Robaxin, oxycodone 2.5 mg every 6 hours as needed for severe pain, recommended to have oxycodone prior to physical therapy so she can work well with physical therapy at home. -Fall precaution, PT/OT   Acute metabolic encephalopathy, small subdural hematoma -There was a concern for seizure-like activity per patient's son hence EEG done, negative for any seizure or epileptiform discharges -MRI of the brain was done which showed small subdural hematoma, 2 mm, not causing any mass effect.  She has extensive cortical atrophy and ventriculomegaly ex vacuo.  Seen by Dr. Wynetta Emery, recommended no surgical intervention needed.  Patient is not on any anticoagulation or antiplatelet agent.  Probably was present at admission CT however  sensitivity of the MRI picked it up and is clinically insignificant. -She is fairly alert and oriented, no new FND's, appears closer to baseline.  Per son, he feels  comfortable at this point to discharge home, has 24/7 supervision and caregivers at home.  Home health PT will be arranged   AKI superimposed on CKD stage IIIa -Baseline creatinine 1.1-1.3.  No obstruction noted on CT, CK normal -Lasix, Aldactone, Jardiance held -Creatinine plateaued at 2.3 -Creatinine improving to 1.6, patient can resume Lasix on 02/05/2024 however continue to hold Jardiance and Aldactone and discussed with cardiology/PCP outpatient when to resume.   -Recommend follow-up BMET in 1 week    Diabetes mellitus type 2, NIDDM  -Hemoglobin A1c 6.1 -Currently holding Jardiance, continue diet control     Hyperlipidemia -Transaminitis has improved, resume Lipitor   Dementia with behavioral disturbance:  - Had an episode of agitation/confusion the night of 2/23-24.  - CT head without acute finding.   -Continue memantine, delirium precautions       Transaminitis, acute  -Resolved, statin was held during hospitalization.   Essential hypertension:  -Continue amlodipine, resume Lasix in 2 days   Chronic back pain/osteoarthritis/degenerative disc disease -Pain control and PT/OT   Hypothyroidism: TSH normal. -Continue Synthroid   Bilateral carotid artery atherosclerosis Aortic atherosclerosis -Continue statin.   Mood disorder: Stable. -Continue home Lexapro   GERD -Continue home Protonix   Dysphagia? -Tolerating heart healthy diet, seen by SLP   History of osteoporosis: Seems like she is on Prolia injection every 6 months.   Pancreatic lesion:  -CT abdomen and pelvis showed 8 to 9 mm water density pancreatic tail lesion.  -Follow-up MRI abdomen in 6 months recommended   Estimated body mass index is 21.61 kg/m as calculated from the following:   Height as of this encounter: 5' (1.524 m).   Weight as of this encounter: 50.2 kg.   Management plan discussed with patient's son and daughter Verlon Au on the phone this morning during patient encounter.  Also  discussed again regarding the discharge planning with patient's son, Genevie Cheshire who requested for discharge home as patient has 24/7 supervision at home and they feel comfortable at this point that patient will do well.     Pain control - [employer] Controlled Substance Reporting System database was reviewed. and patient was instructed, not to drive, operate heavy machinery, perform activities at heights, swimming or participation in water activities or provide baby-sitting services while on Pain, Sleep and Anxiety Medications; until their outpatient Physician has advised to do so again. Also recommended to not to take more than prescribed Pain, Sleep and Anxiety Medications.  Consultants: Trauma surgery, neurosurgery Procedures performed: MRI, EEG Disposition: Home Diet recommendation:  Discharge Diet Orders (From admission, onward)     Start     Ordered   02/03/24 0000  Diet - low sodium heart healthy        02/03/24 1323            DISCHARGE MEDICATION: Allergies as of 02/03/2024       Reactions   Tape Other (See Comments)   SKIN IS VERY THIN- TAPE TEARS VERY EASILY!!   Celexa [citalopram Hydrobromide] Other (See Comments)   Nightmares   Fluconazole Other (See Comments)   Unknown reaction   Green Tea (camellia Sinensis)    Other Reaction(s): mouth ulcers   Green Tea Leaf Ext Other (See Comments)   Mouth ulcers   Hydralazine Hcl    Other Reaction(s): easy bruising  Kenalog [triamcinolone] Other (See Comments)   Spinal injection caused sweats   Levaquin [levofloxacin] Hives   Losartan Potassium-hctz Other (See Comments)   Night sweats/ fatigue   Magnesium Citrate Other (See Comments)   Methocarbamol Other (See Comments)   500 mg is too much- can tolerate a lesser dose   Nitrofurantoin Macrocrystal Other (See Comments)   Possible vertigo   Scopolamine Other (See Comments)   Memory change   Sulfa Antibiotics Other (See Comments)   Reaction not recalled by the patient    Tea    Other reaction(s): Not available   Tramadol    Makes pt feel loopy   Trimethoprim Hives        Medication List     PAUSE taking these medications    Jardiance 10 MG Tabs tablet Wait to take this until your doctor or other care provider tells you to start again. Generic drug: empagliflozin Take 10 mg by mouth daily.   spironolactone 25 MG tablet Wait to take this until your doctor or other care provider tells you to start again. Commonly known as: ALDACTONE Take 1 tablet (25 mg total) by mouth daily.       TAKE these medications    acetaminophen 650 MG CR tablet Commonly known as: TYLENOL Take 650-1,300 mg by mouth every 8 (eight) hours as needed for pain.   amLODipine 5 MG tablet Commonly known as: NORVASC Take 5 mg by mouth 2 (two) times daily.   denosumab 60 MG/ML Sosy injection Commonly known as: PROLIA Inject 60 mg into the skin every 6 (six) months.   diclofenac sodium 1 % Gel Commonly known as: VOLTAREN Apply 1 application topically 2 (two) times daily as needed (to painful sites).   escitalopram 10 MG tablet Commonly known as: LEXAPRO Take 10 mg by mouth at bedtime.   folic acid 1 MG tablet Commonly known as: FOLVITE Take 1 tablet (1 mg total) by mouth daily. What changed: when to take this   furosemide 20 MG tablet Commonly known as: LASIX Take 1 tablet (20 mg total) by mouth daily. Start taking on: February 05, 2024 What changed: These instructions start on February 05, 2024. If you are unsure what to do until then, ask your doctor or other care provider.   levothyroxine 75 MCG tablet Commonly known as: SYNTHROID Take 75 mcg by mouth every morning.   memantine 10 MG tablet Commonly known as: Namenda Take 1 tablet (10 mg total) by mouth 2 (two) times daily.   methocarbamol 500 MG tablet Commonly known as: ROBAXIN Take 0.5 tablets (250 mg total) by mouth every 6 (six) hours as needed for muscle spasms (pain).   ondansetron 4 MG  tablet Commonly known as: ZOFRAN Take 1 tablet (4 mg total) by mouth every 8 (eight) hours as needed for nausea or vomiting.   oxyCODONE 5 MG immediate release tablet Commonly known as: Oxy IR/ROXICODONE Take 0.5 tablets (2.5 mg total) by mouth every 6 (six) hours as needed for severe pain (pain score 7-10) or breakthrough pain.   pantoprazole 40 MG tablet Commonly known as: PROTONIX Take 40 mg by mouth 2 (two) times daily.   polyethylene glycol 17 g packet Commonly known as: MIRALAX / GLYCOLAX Take 17 g by mouth daily. Also available over-the-counter Start taking on: February 04, 2024   Refresh 1.4-0.6 % Soln Generic drug: Polyvinyl Alcohol-Povidone PF Place 1 Application into both eyes as needed (Dry eye).   Restasis 0.05 % ophthalmic emulsion Generic drug: cycloSPORINE  Place 1 drop into both eyes 2 (two) times daily.   rosuvastatin 5 MG tablet Commonly known as: CRESTOR Take 5 mg by mouth daily.   senna-docusate 8.6-50 MG tablet Commonly known as: Senokot-S Take 1 tablet by mouth 2 (two) times daily as needed for moderate constipation.   sucralfate 1 g tablet Commonly known as: CARAFATE Take 1 g by mouth 2 (two) times daily.        Follow-up Information     Thana Ates, MD. Schedule an appointment as soon as possible for a visit in 2 week(s).   Specialty: Internal Medicine Why: for hospital follow-up, obtain labs BMET Contact information: [address] [place] [address] [phone]         [name], MD. Schedule an appointment as soon as possible for a visit in 2 week(s).   Specialty: Cardiology Why: for hospital follow-up Contact information: [address] [place] [address] [phone]         Donalee Citrin, MD. Schedule an appointment as soon as possible for a visit in 2 week(s).   Specialty: Neurosurgery Why: for hospital follow-up Contact information: [address] [place]  [place] [address] [phone]                Discharge Exam: Ceasar Mons Weights   01/29/24 2002  Weight: 50.2 kg   S: Seen this morning.  Initially son at the bedside was concerned about mental status changes, ?  Seizure-like activity.  Patient was seen shortly thereafter and was alert and oriented, no focal neurological deficits on exam, close to her baseline mental status.  No chest pain, shortness of breath, fevers or chills.  BP (!) 158/64 (BP Location: Left Arm)   Pulse (!) 56   Temp 98 F (36.7 C) (Oral)   Resp 16   Ht 5' (1.524 m)   Wt 50.2 kg   SpO2 96%   BMI 21.61 kg/m   Physical Exam General: Alert and oriented to self, person, place and time, NAD Cardiovascular: S1 S2 clear, RRR.  Respiratory: CTAB, no wheezing, rales or rhonchi Gastrointestinal: Soft, nontender, nondistended, NBS Ext: no pedal edema bilaterally Neuro: no new deficits    Condition at discharge: fair  The results of significant diagnostics from this hospitalization (including imaging, microbiology, ancillary and laboratory) are listed below for reference.   Imaging Studies: EEG adult Result Date: 02/03/2024 Charlsie Quest, MD     02/03/2024 11:50 AM Patient Name: Monia Timmers MRN: [id number] Epilepsy Attending: Charlsie Quest Referring Physician/Provider: Cathren Harsh, MD Date: 02/03/2024 Duration: 28.01 mins Patient history: 83yo F with ams. EEG to evaluate for seizure Level of alertness: Awake, asleep AEDs during EEG study: None Technical aspects: This EEG study was done with scalp electrodes positioned according to the 10-20 International system of electrode placement. Electrical activity was reviewed with band pass filter of 1-70Hz , sensitivity of 7 uV/mm, display speed of 3mm/sec with a 60Hz  notched filter applied as appropriate. EEG data were recorded continuously and digitally stored.  Video monitoring was available and reviewed as appropriate. Description: The posterior dominant rhythm  consists of 7.5 Hz activity of moderate voltage (25-35 uV) seen predominantly in posterior head regions, symmetric and reactive to eye opening and eye closing. Sleep was characterized by vertex waves, sleep spindles (12 to 14 Hz), maximal frontocentral region. EEG showed intermittent generalized 3 to 6 Hz theta-delta slowing. Hyperventilation and photic stimulation were not performed.  ABNORMALITY - Intermittent slow, generalized IMPRESSION: This study is suggestive of mild diffuse encephalopathy. No seizures or epileptiform discharges were seen throughout the recording. Charlsie Quest   MR BRAIN WO CONTRAST Result Date: 02/03/2024 CLINICAL DATA:  Provided history: Mental status change, unknown cause. Seizure. EXAM: MRI HEAD WITHOUT CONTRAST TECHNIQUE: Multiplanar, multiecho pulse sequences of the brain and surrounding structures were obtained without intravenous contrast. COMPARISON:  Head CT 01/29/2024.  Brain MRI 10/03/2023. FINDINGS: Intermittently motion degraded examination. Most notably, the axial FLAIR sequence is moderate to severely motion degraded. Within this limitation, findings are as follows. Brain: Mild-to-moderate generalized cerebral atrophy. Mild cerebellar atrophy. Commensurate prominence of the ventricles and sulci. Thin subdural collection overlying the right cerebral hemisphere (measuring up to 2-3 mm in thickness) (for instance as seen on series 6, image 12). Multifocal T2 FLAIR hyperintense signal abnormality within the cerebral white matter, nonspecific but compatible with advanced chronic small vessel ischemic disease. To a lesser degree, chronic small vessel ischemic changes are also present within the pons. Small chronic infarct within the right cerebellar hemisphere. No cortical encephalomalacia is identified. No appreciable hippocampal size or signal asymmetry. There is no acute infarct. No evidence of an intracranial mass. No midline shift. Vascular: Maintained flow voids within  the proximal large arterial vessels. Skull and upper cervical spine: No focal worrisome marrow lesion. Sinuses/Orbits: No mass or acute finding within the imaged orbits. Prior bilateral ocular lens replacement. Small fluid level within the left sphenoid sinus. Minimal mucosal thickening scattered within bilateral ethmoid air cells. Other: Right mastoid effusion. Impression #2 were called by telephone at the time of interpretation on 02/03/2024 at 10:25 am to provider Haide Klinker , who verbally acknowledged these results. IMPRESSION: 1. Intermittently motion degraded exam. 2. Thin subdural collection (likely subdural hematoma) overlying the right cerebral hemisphere, measuring up to 2-3 mm in thickness. 3. Advanced chronic small vessel ischemic disease, similar to the prior brain MRI of 10/03/2023. 4. Small chronic infarct within the right cerebellar hemisphere, unchanged. 5. Mild-to-moderate generalized cerebral atrophy. 6. Mild cerebellar atrophy. 7. Paranasal sinus disease as described. 8. Right mastoid effusion. Electronically Signed   By: Jackey Loge D.O.   On: 02/03/2024 10:26   DG CHEST PORT 1 VIEW Result Date: 01/31/2024 CLINICAL DATA:  [id number] Pneumothorax, right [id number] EXAM: PORTABLE CHEST 1 VIEW COMPARISON:  01/30/2024 FINDINGS: Stable cardiomegaly. Trace right apical pneumothorax, slightly improved. Persistent right basilar opacity with possible small effusion. Left lung remains clear. IMPRESSION: 1. Trace right apical pneumothorax, slightly improved. 2. Persistent right basilar opacity with possible small effusion. Electronically Signed   By: Duanne Guess D.O.   On: 01/31/2024 09:47   DG CHEST PORT 1 VIEW Result Date: 01/30/2024 CLINICAL DATA:  83 year old female status post fall with right pneumothorax, right rib fractures. EXAM: PORTABLE CHEST 1 VIEW COMPARISON:  Portable chest 01/29/2024 and earlier. FINDINGS: Portable AP semi upright view at 0544 hours. Small right apical pneumothorax  persists. Lower lung volumes, increased confluent right lung base opacity. Stable underlying cardiomegaly. Left lung appears stable. Stable cholecystectomy clips. Nonobstructed visible bowel gas pattern. Right rib fractures redemonstrated. Stable visualized osseous structures. IMPRESSION: 1. Persistent small right apical pneumothorax. Increasing right lung base opacity which could be increasing atelectasis or small pleural effusion. 2. Right rib fractures. Stable cardiomegaly. Left lung appears negative. Electronically Signed   By: Odessa Fleming M.D.   On: 01/30/2024 06:01   CT CHEST ABDOMEN PELVIS WO CONTRAST Result Date: 01/29/2024 CLINICAL DATA:  Patient fell wall walking.  Blunt poly trauma. EXAM: CT CHEST, ABDOMEN AND PELVIS WITHOUT CONTRAST TECHNIQUE: Multidetector CT imaging of the chest, abdomen and pelvis was performed following the standard protocol without IV contrast. RADIATION DOSE REDUCTION: This exam was performed according to the departmental dose-optimization program which includes automated exposure control, adjustment of the mA and/or kV according to patient size and/or use of iterative reconstruction technique. COMPARISON:  Abdomen and pelvis CT 06/15/2023 FINDINGS: CT CHEST FINDINGS Cardiovascular: The heart size is normal. No substantial pericardial effusion. Coronary artery calcification is evident. Mild atherosclerotic calcification is noted in the wall of the thoracic aorta. Mediastinum/Nodes: No mediastinal lymphadenopathy. No evidence for gross hilar lymphadenopathy although assessment is limited by the lack of intravenous contrast on the current study. Tiny hiatal hernia. The esophagus has normal imaging features. There is no axillary lymphadenopathy. Lungs/Pleura: Small to moderate right pneumothorax noted with patchy airspace opacity in the dependent right lung base, consistent with atelectasis. There is trace atelectasis dependently in the left base. No substantial pleural effusion.  Musculoskeletal: Acute fractures noted lateral right seventh and eighth ribs with 1 shaft with of displacement of the lateral right eighth rib fracture. There is a second fracture in the paraspinal right eighth rib and an acute fracture is noted in the posterior right ninth rib. No evidence for an acute fracture involving the thoracic spine. CT ABDOMEN PELVIS FINDINGS Hepatobiliary: No suspicious focal abnormality in the liver on this study without intravenous contrast. Cholecystectomy. No intrahepatic or extrahepatic biliary dilation. Pancreas: Pancreatic parenchyma is diffusely atrophic. 8-9 mm low-density lesion identified in the pancreatic tail on image 50 of series 34. This lesion approaches water density. Spleen: No splenomegaly. No suspicious focal mass lesion. Adrenals/Urinary Tract: No adrenal nodule or mass. Kidneys unremarkable. No evidence for hydroureter. The urinary bladder appears normal for the degree of distention. Stomach/Bowel: Tiny hiatal hernia. Stomach otherwise unremarkable. Duodenum is normally positioned as is the ligament of Treitz. No small bowel wall thickening. No small bowel dilatation. The terminal ileum is normal. The appendix is normal. No gross colonic mass. No colonic wall thickening. Diverticular changes are noted in the left colon without evidence of diverticulitis. Moderate stool volume. Vascular/Lymphatic: There is advanced atherosclerotic calcification of the abdominal aorta without aneurysm. There is no gastrohepatic or hepatoduodenal ligament lymphadenopathy. No retroperitoneal or mesenteric lymphadenopathy. No pelvic sidewall lymphadenopathy. Reproductive: The uterus is unremarkable.  There is no adnexal mass. Other: No intraperitoneal free fluid. Musculoskeletal: No evidence for lumbar spine fracture. No sacral fracture. Superior and inferior pubic rami are intact. Status post right hip replacement. Changes of avascular necrosis noted left femoral head. IMPRESSION: 1.  Small to moderate right pneumothorax with patchy airspace opacity in the dependent right lung base, consistent with atelectasis. 2. Acute fractures of the right seventh through ninth ribs. 3. No evidence for acute traumatic injury in the abdomen or pelvis. 4. 8-9 mm low-density lesion in the pancreatic tail approaches water density. Follow-up MRI abdomen without and with contrast recommended in 6 months to reassess and to establish baseline for follow-up imaging recommendations. 5. Left colonic diverticulosis without diverticulitis. 6. Changes of avascular necrosis left femoral head. 7.  Aortic Atherosclerosis (ICD10-I70.0). I called the findings of right-sided pneumothorax and right rib fractures directly to Dr. Jacqulyn Bath in the emergency department at approximately 0702 hours. Electronically Signed   By: Kennith Center M.D.   On: 01/29/2024 09:14   DG Chest Port 1 View Result Date: 01/29/2024 CLINICAL DATA:  Known rib fracture.  Severe pain. EXAM: PORTABLE CHEST 1  VIEW COMPARISON:  06/21/2023.  CT scan earlier same day. FINDINGS: Right apical pneumothorax evident. The cardio pericardial silhouette is enlarged. Interstitial markings are diffusely coarsened with chronic features. Bones are diffusely demineralized. Lower right rib fracture noted. IMPRESSION: 1. Right apical pneumothorax. 2. Lower right rib fracture. Electronically Signed   By: Kennith Center M.D.   On: 01/29/2024 08:20   CT Cervical Spine Wo Contrast Result Date: 01/29/2024 CLINICAL DATA:  83 year old female status post fall while walking to bathroom. EXAM: CT CERVICAL SPINE WITHOUT CONTRAST TECHNIQUE: Multidetector CT imaging of the cervical spine was performed without intravenous contrast. Multiplanar CT image reconstructions were also generated. RADIATION DOSE REDUCTION: This exam was performed according to the departmental dose-optimization program which includes automated exposure control, adjustment of the mA and/or kV according to patient size  and/or use of iterative reconstruction technique. COMPARISON:  Cervical spine CT 09/12/2022. FINDINGS: Alignment: Stable cervical lordosis since 2023. Stable mild degenerative chronic anterolisthesis at C7-T1. Maintained posterior element alignment. Skull base and vertebrae: Bone mineralization is within normal limits for age. Visualized skull base is intact. No atlanto-occipital dissociation. C1 and C2 appear intact and aligned. No acute osseous abnormality identified. Soft tissues and spinal canal: No prevertebral fluid or swelling. No visible canal hematoma. Bulky calcified cervical carotid atherosclerosis but otherwise negative visible noncontrast neck soft tissues. Disc levels:  Stable mild for age cervical spine degeneration. Upper chest: Visible upper thoracic levels appear grossly intact. Mild apical lung scarring. IMPRESSION: 1. No acute traumatic injury identified in the cervical spine. 2. Stable cervical spine degeneration, generally mild for age. 3. Bulky calcified cervical carotid atherosclerosis. Electronically Signed   By: Odessa Fleming M.D.   On: 01/29/2024 07:02   CT Head Wo Contrast Result Date: 01/29/2024 CLINICAL DATA:  83 year old female status post fall while walking to bathroom. EXAM: CT HEAD WITHOUT CONTRAST TECHNIQUE: Contiguous axial images were obtained from the base of the skull through the vertex without intravenous contrast. RADIATION DOSE REDUCTION: This exam was performed according to the departmental dose-optimization program which includes automated exposure control, adjustment of the mA and/or kV according to patient size and/or use of iterative reconstruction technique. COMPARISON:  Brain MRI 10/03/2023.  Head CT 08/03/2023. FINDINGS: Brain: Stable cerebral volume. No midline shift, ventriculomegaly, mass effect, evidence of mass lesion, intracranial hemorrhage or evidence of cortically based acute infarction. Patchy and confluent chronic cerebral white matter hypodensity appears  stable. Stable gray-white matter differentiation throughout the brain. Stable punctate dystrophic calcifications in the posterior hemispheres. Vascular: Calcified atherosclerosis at the skull base. No suspicious intracranial vascular hyperdensity. Skull: Stable.  No acute osseous abnormality identified. Sinuses/Orbits: Visualized paranasal sinuses and mastoids are stable and well aerated. Other: No acute orbit or scalp soft tissue injury identified. IMPRESSION: 1. No acute intracranial abnormality or acute traumatic injury identified. 2. Stable non contrast CT appearance of chronic white matter disease. Electronically Signed   By: Odessa Fleming M.D.   On: 01/29/2024 07:00   ECHOCARDIOGRAM COMPLETE Result Date: 01/17/2024    ECHOCARDIOGRAM REPORT   Patient Name:   GAILYA TAUER Date of Exam: 01/17/2024 Medical Rec #:  [id number]         Height:       58.0 in Accession #:    [id number]        Weight:       113.0 lb Date of Birth:  14-Aug-1941         BSA:          1.429 m Patient Age:  82 years          BP:           133/63 mmHg Patient Gender: F                 HR:           58 bpm. Exam Location:  [address] Procedure: 2D Echo, 3D Echo, Cardiac Doppler and Color Doppler Indications:    R01.1 Murmur  History:        Patient has prior history of Echocardiogram examinations, most                 recent 04/08/2022. PAD and CKD; Risk Factors:Hypertension,                 Diabetes and HLD.  Sonographer:    Clearence Ped RCS Referring Phys: [id number] MAHESH A CHANDRASEKHAR IMPRESSIONS  1. Left ventricular ejection fraction, by estimation, is 60 to 65%. The left ventricle has normal function. The left ventricle has no regional wall motion abnormalities. There is moderate concentric left ventricular hypertrophy. Left ventricular diastolic parameters are consistent with Grade I diastolic dysfunction (impaired relaxation).  2. Right ventricular systolic function is normal. The right ventricular size is normal. There is mildly  elevated pulmonary artery systolic pressure.  3. Left atrial size was severely dilated.  4. Right atrial size was moderately dilated.  5. The mitral valve is grossly normal. No evidence of mitral valve regurgitation.  6. Tricuspid valve regurgitation is moderate.  7. The aortic valve was not well visualized. Aortic valve regurgitation is not visualized.  8. The inferior vena cava is normal in size with <50% respiratory variability, suggesting right atrial pressure of 8 mmHg. FINDINGS  Left Ventricle: Left ventricular ejection fraction, by estimation, is 60 to 65%. The left ventricle has normal function. The left ventricle has no regional wall motion abnormalities. The left ventricular internal cavity size was normal in size. There is  moderate concentric left ventricular hypertrophy. Left ventricular diastolic parameters are consistent with Grade I diastolic dysfunction (impaired relaxation). Right Ventricle: The right ventricular size is normal. Right ventricular systolic function is normal. There is mildly elevated pulmonary artery systolic pressure. The tricuspid regurgitant velocity is 2.86 m/s, and with an assumed right atrial pressure of 8 mmHg, the estimated right ventricular systolic pressure is 40.7 mmHg. Left Atrium: Left atrial size was severely dilated. Right Atrium: Right atrial size was moderately dilated. Pericardium: There is no evidence of pericardial effusion. Mitral Valve: The mitral valve is grossly normal. Mild mitral annular calcification. No evidence of mitral valve regurgitation. Tricuspid Valve: Tricuspid valve regurgitation is moderate. Aortic Valve: The aortic valve was not well visualized. Aortic valve regurgitation is not visualized. Pulmonic Valve: Pulmonic valve regurgitation is trivial. Aorta: The aortic root and ascending aorta are structurally normal, with no evidence of dilitation. Venous: The inferior vena cava is normal in size with less than 50% respiratory variability,  suggesting right atrial pressure of 8 mmHg. IAS/Shunts: The interatrial septum was not well visualized.  LEFT VENTRICLE PLAX 2D LVIDd:         3.70 cm   Diastology LVIDs:         2.40 cm   LV e' medial:    5.00 cm/s LV PW:         1.50 cm   LV E/e' medial:  18.3 LV IVS:        1.40 cm   LV e' lateral:   11.20 cm/s LVOT diam:  1.80 cm   LV E/e' lateral: 8.2 LV SV:         61 LV SV Index:   42 LVOT Area:     2.54 cm                           3D Volume EF:                          3D EF:        71 %                          LV EDV:       94 ml                          LV ESV:       28 ml                          LV SV:        66 ml RIGHT VENTRICLE             IVC RV Basal diam:  3.40 cm     IVC diam: 1.10 cm RV S prime:     11.60 cm/s TAPSE (M-mode): 1.9 cm RVSP:           35.7 mmHg LEFT ATRIUM             Index        RIGHT ATRIUM           Index LA diam:        4.60 cm 3.22 cm/m   RA Pressure: 3.00 mmHg LA Vol (A2C):   77.4 ml 54.17 ml/m  RA Area:     19.10 cm LA Vol (A4C):   68.3 ml 47.80 ml/m  RA Volume:   54.30 ml  38.01 ml/m LA Biplane Vol: 74.8 ml 52.35 ml/m  AORTIC VALVE LVOT Vmax:   92.60 cm/s LVOT Vmean:  57.200 cm/s LVOT VTI:    0.238 m  AORTA Ao Root diam: 2.10 cm Ao Asc diam:  2.50 cm MITRAL VALVE                TRICUSPID VALVE MV Area (PHT):              TR Peak grad:   32.7 mmHg MV Decel Time:              TR Vmax:        286.00 cm/s MV E velocity: 91.70 cm/s   Estimated RAP:  3.00 mmHg MV A velocity: 125.00 cm/s  RVSP:           35.7 mmHg MV E/A ratio:  0.73                             SHUNTS                             Systemic VTI:  0.24 m                             Systemic Diam: 1.80 cm Carolan Clines Electronically signed by Carolan Clines Signature Date/Time: 01/17/2024/2:13:07  PM    Final    NM PET Brain Amyloid Result Date: 01/11/2024 CLINICAL DATA:  83 year old female with dementia. Non vascular etiology suspected. EXAM: NUCLEAR MEDICINE PET BRAIN AMYLOID TECHNIQUE: 10.2 mCi F-18  Florbetapir was injected intravenously. Full-ring PET imaging was performed from the vertex to skull base. CT data was obtained and used for attenuation correction and anatomic localization. COMPARISON:  None available. FINDINGS: There is diffusely increased florbetapir uptake seen in the cortical cerebral gray matter of the temporal, frontal, occipital, and parietal lobes, with these regions showing clear loss of the normal gray-white contrast. The cerebellum has no evidence of abnormal uptake. 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 A positive scan demonstrates the presence of AD pathology. Note: Florbetapir is a radiopharmaceutical indicated for Positron Emission Tomography (PET) imaging of beta-amyloid neuritic plaques in the brains of adult patients with cognitive impairment being evaluated for suspected Alzheimer's disease (AD). A positive scan indicates moderate to frequent plaques, which demonstrates the presence of AD pathology. A negative scan indicates sparse or no plaques, which is inconsistent with a diagnosis of AD. Florbetapir is an adjunct to other diagnostic evaluations. Electronically Signed   By: Genevive Bi M.D.   On: 01/11/2024 16:15    Microbiology: Results for orders placed or performed during the hospital encounter of 05/01/23  Resp panel by RT-PCR (RSV, Flu A&B, Covid) Anterior Nasal Swab     Status: None   Collection Time: 05/01/23  6:08 AM   Specimen: Anterior Nasal Swab  Result Value Ref Range Status   SARS Coronavirus 2 by RT PCR NEGATIVE NEGATIVE Final   Influenza A by PCR NEGATIVE NEGATIVE Final   Influenza B by PCR NEGATIVE NEGATIVE Final    Comment: (NOTE) The Xpert Xpress SARS-CoV-2/FLU/RSV plus assay is intended as an aid in the diagnosis of influenza from Nasopharyngeal swab specimens and should not be used as a sole basis for treatment. Nasal washings  and aspirates are unacceptable for Xpert Xpress SARS-CoV-2/FLU/RSV testing.  Fact Sheet for Patients: BloggerCourse.com  Fact Sheet for Healthcare Providers: SeriousBroker.it  This test is not yet approved or cleared by the [place] FDA and has been authorized for detection and/or diagnosis of SARS-CoV-2 by FDA under an Emergency Use Authorization (EUA). This EUA will remain in effect (meaning this test can be used) for the duration of the COVID-19 declaration under Section 564(b)(1) of the Act, 21 U.S.C. section 360bbb-3(b)(1), unless the authorization is terminated or revoked.     Resp Syncytial Virus by PCR NEGATIVE NEGATIVE Final    Comment: (NOTE) Fact Sheet for Patients: BloggerCourse.com  Fact Sheet for Healthcare Providers: SeriousBroker.it  This test is not yet approved or cleared by the [place] FDA and has been authorized for detection and/or diagnosis of SARS-CoV-2 by FDA under an Emergency Use Authorization (EUA). This EUA will remain in effect (meaning this test can be used) for the duration of the COVID-19 declaration under Section 564(b)(1) of the Act, 21 U.S.C. section 360bbb-3(b)(1), unless the authorization is terminated or revoked.  Performed at [facility], [address], [place], [place] [address]     Labs: CBC: Recent Labs  Lab 01/29/24 0328 01/30/24 0407 01/31/24 0248 02/01/24 0453 02/03/24 0432  WBC 13.3* 7.4 8.9 7.9 8.4  NEUTROABS 11.0*  --   --   --   --   HGB 14.4 10.1* 11.3* 10.2* 13.6  HCT 44.5  30.3* 34.0* 30.6* 40.8  MCV 95.7 94.7 95.5 95.0 93.6  PLT 202 154 171 161 215   Basic Metabolic Panel: Recent Labs  Lab 01/29/24 0328 01/30/24 0407 01/31/24 0248 02/01/24 0453 02/03/24 0432  NA 135 137 140 136 137  K 3.1* 3.9 4.1 3.7 4.0  CL 101 109 109 105 104  CO2 17* 19* 22 24 23   GLUCOSE 167* 112* 88 108* 109*   BUN 40* 27* 25* 27* 27*  CREATININE 2.31* 1.84* 1.71* 1.76* 1.60*  CALCIUM 9.7 8.1* 8.9 8.9 10.0  MG 2.1  --  2.1 2.1  --   PHOS 3.3  --   --   --   --    Liver Function Tests: Recent Labs  Lab 01/29/24 0328 01/30/24 0407 01/31/24 0248 02/01/24 0453 02/03/24 0432  AST 26 150* 61* 29 21  ALT 15 116* 79* 46* 30  ALKPHOS 89 150* 128* 115 114  BILITOT 1.2 0.8 0.8 0.6 0.7  PROT 8.1 5.6* 6.0* 5.2* 6.5  ALBUMIN 4.8 3.2* 3.2* 2.7* 3.1*   CBG: Recent Labs  Lab 02/02/24 0805 02/02/24 1256 02/02/24 1610 02/02/24 2104 02/03/24 0756  GLUCAP 121* 245* 156* 127* 120*    Discharge time spent: greater than 30 minutes.  Signed: Thad Ranger, MD [facility] 02/03/2024

## 2024-02-03 NOTE — TOC Transition Note (Signed)
 Transition of Care [facility]) - Discharge Note   Patient Details  Name: Christina Sellers MRN: [id number] Date of Birth: Jun 11, 1941  [facility] CM/SW Contact:  Gala Lewandowsky, RN Phone Number: 02/03/2024, 2:04 PM   Clinical Narrative: Patient will transition home today via private vehicle. [facility] is aware that the patient will transition home.   Final next level of care: Home w Home Health Services Barriers to Discharge: No Barriers Identified   Patient Goals and CMS Choice Patient states their goals for this hospitalization and ongoing recovery are:: plan to return home with 24/7 supervision.   Discharge Plan and Services Additional resources added to the After Visit Summary for   In-house Referral: NA Discharge Planning Services: CM Consult Post Acute Care Choice: Home Health             HH Arranged: PT, OT [facility] Agency: [facility] Date HH Agency Contacted: 02/01/24 Time HH Agency Contacted: 1330 Representative spoke with at [facility] Agency: Adele Dan  Social Drivers of Health (SDOH) Interventions SDOH Screenings   Food Insecurity: No Food Insecurity (01/29/2024)  Housing: Low Risk  (01/29/2024)  Transportation Needs: No Transportation Needs (01/29/2024)  Utilities: Not At Risk (07/15/2023)  Alcohol Screen: Low Risk  (07/15/2023)  Financial Resource Strain: Low Risk  (07/15/2023)  Physical Activity: Inactive (07/15/2023)  Tobacco Use: Medium Risk (01/29/2024)   Readmission Risk Interventions     No data to display

## 2024-02-03 NOTE — Progress Notes (Signed)
 EEG complete - results pending

## 2024-02-03 NOTE — Progress Notes (Signed)
 Pt is going to MRI. Will try back for EEG as schedule permits

## 2024-02-03 NOTE — Procedures (Signed)
 Patient Name: Sanvika Cuttino  MRN: [id number]  Epilepsy Attending: Charlsie Quest  Referring Physician/Provider: Cathren Harsh, MD  Date: 02/03/2024 Duration: 28.01 mins  Patient history: 83yo F with ams. EEG to evaluate for seizure  Level of alertness: Awake, asleep  AEDs during EEG study: None  Technical aspects: This EEG study was done with scalp electrodes positioned according to the 10-20 International system of electrode placement. Electrical activity was reviewed with band pass filter of 1-70Hz , sensitivity of 7 uV/mm, display speed of 75mm/sec with a 60Hz  notched filter applied as appropriate. EEG data were recorded continuously and digitally stored.  Video monitoring was available and reviewed as appropriate.  Description: The posterior dominant rhythm consists of 7.5 Hz activity of moderate voltage (25-35 uV) seen predominantly in posterior head regions, symmetric and reactive to eye opening and eye closing. Sleep was characterized by vertex waves, sleep spindles (12 to 14 Hz), maximal frontocentral region. EEG showed intermittent generalized 3 to 6 Hz theta-delta slowing. Hyperventilation and photic stimulation were not performed.     ABNORMALITY - Intermittent slow, generalized  IMPRESSION: This study is suggestive of mild diffuse encephalopathy. No seizures or epileptiform discharges were seen throughout the recording.  Christina Sellers

## 2024-02-06 DIAGNOSIS — J9383 Other pneumothorax: Secondary | ICD-10-CM | POA: Diagnosis not present

## 2024-02-09 DIAGNOSIS — E1151 Type 2 diabetes mellitus with diabetic peripheral angiopathy without gangrene: Secondary | ICD-10-CM | POA: Diagnosis not present

## 2024-02-09 DIAGNOSIS — M51369 Other intervertebral disc degeneration, lumbar region without mention of lumbar back pain or lower extremity pain: Secondary | ICD-10-CM | POA: Diagnosis not present

## 2024-02-09 DIAGNOSIS — R1313 Dysphagia, pharyngeal phase: Secondary | ICD-10-CM | POA: Diagnosis not present

## 2024-02-09 DIAGNOSIS — E1159 Type 2 diabetes mellitus with other circulatory complications: Secondary | ICD-10-CM | POA: Diagnosis not present

## 2024-02-09 DIAGNOSIS — M3500 Sicca syndrome, unspecified: Secondary | ICD-10-CM | POA: Diagnosis not present

## 2024-02-09 DIAGNOSIS — E1169 Type 2 diabetes mellitus with other specified complication: Secondary | ICD-10-CM | POA: Diagnosis not present

## 2024-02-09 DIAGNOSIS — J4489 Other specified chronic obstructive pulmonary disease: Secondary | ICD-10-CM | POA: Diagnosis not present

## 2024-02-09 DIAGNOSIS — K589 Irritable bowel syndrome without diarrhea: Secondary | ICD-10-CM | POA: Diagnosis not present

## 2024-02-09 DIAGNOSIS — G4700 Insomnia, unspecified: Secondary | ICD-10-CM | POA: Diagnosis not present

## 2024-02-09 DIAGNOSIS — H9193 Unspecified hearing loss, bilateral: Secondary | ICD-10-CM | POA: Diagnosis not present

## 2024-02-09 DIAGNOSIS — M800AXD Age-related osteoporosis with current pathological fracture, other site, subsequent encounter for fracture with routine healing: Secondary | ICD-10-CM | POA: Diagnosis not present

## 2024-02-09 DIAGNOSIS — J939 Pneumothorax, unspecified: Secondary | ICD-10-CM | POA: Diagnosis not present

## 2024-02-09 DIAGNOSIS — I471 Supraventricular tachycardia, unspecified: Secondary | ICD-10-CM | POA: Diagnosis not present

## 2024-02-09 DIAGNOSIS — E1165 Type 2 diabetes mellitus with hyperglycemia: Secondary | ICD-10-CM | POA: Diagnosis not present

## 2024-02-09 DIAGNOSIS — K219 Gastro-esophageal reflux disease without esophagitis: Secondary | ICD-10-CM | POA: Diagnosis not present

## 2024-02-09 DIAGNOSIS — M170 Bilateral primary osteoarthritis of knee: Secondary | ICD-10-CM | POA: Diagnosis not present

## 2024-02-09 DIAGNOSIS — N183 Chronic kidney disease, stage 3 unspecified: Secondary | ICD-10-CM | POA: Diagnosis not present

## 2024-02-09 DIAGNOSIS — I152 Hypertension secondary to endocrine disorders: Secondary | ICD-10-CM | POA: Diagnosis not present

## 2024-02-09 DIAGNOSIS — E785 Hyperlipidemia, unspecified: Secondary | ICD-10-CM | POA: Diagnosis not present

## 2024-02-09 DIAGNOSIS — E1122 Type 2 diabetes mellitus with diabetic chronic kidney disease: Secondary | ICD-10-CM | POA: Diagnosis not present

## 2024-02-09 DIAGNOSIS — E039 Hypothyroidism, unspecified: Secondary | ICD-10-CM | POA: Diagnosis not present

## 2024-02-15 DIAGNOSIS — S065XAD Traumatic subdural hemorrhage with loss of consciousness status unknown, subsequent encounter: Secondary | ICD-10-CM | POA: Diagnosis not present

## 2024-02-15 DIAGNOSIS — S2241XD Multiple fractures of ribs, right side, subsequent encounter for fracture with routine healing: Secondary | ICD-10-CM | POA: Diagnosis not present

## 2024-02-15 DIAGNOSIS — I5032 Chronic diastolic (congestive) heart failure: Secondary | ICD-10-CM | POA: Diagnosis not present

## 2024-02-15 DIAGNOSIS — N1831 Chronic kidney disease, stage 3a: Secondary | ICD-10-CM | POA: Diagnosis not present

## 2024-02-16 DIAGNOSIS — E1169 Type 2 diabetes mellitus with other specified complication: Secondary | ICD-10-CM | POA: Diagnosis not present

## 2024-02-16 DIAGNOSIS — E785 Hyperlipidemia, unspecified: Secondary | ICD-10-CM | POA: Diagnosis not present

## 2024-02-16 DIAGNOSIS — R1313 Dysphagia, pharyngeal phase: Secondary | ICD-10-CM | POA: Diagnosis not present

## 2024-02-16 DIAGNOSIS — H9193 Unspecified hearing loss, bilateral: Secondary | ICD-10-CM | POA: Diagnosis not present

## 2024-02-16 DIAGNOSIS — M3500 Sicca syndrome, unspecified: Secondary | ICD-10-CM | POA: Diagnosis not present

## 2024-02-16 DIAGNOSIS — K219 Gastro-esophageal reflux disease without esophagitis: Secondary | ICD-10-CM | POA: Diagnosis not present

## 2024-02-16 DIAGNOSIS — E1159 Type 2 diabetes mellitus with other circulatory complications: Secondary | ICD-10-CM | POA: Diagnosis not present

## 2024-02-16 DIAGNOSIS — G4700 Insomnia, unspecified: Secondary | ICD-10-CM | POA: Diagnosis not present

## 2024-02-16 DIAGNOSIS — E1122 Type 2 diabetes mellitus with diabetic chronic kidney disease: Secondary | ICD-10-CM | POA: Diagnosis not present

## 2024-02-16 DIAGNOSIS — I471 Supraventricular tachycardia, unspecified: Secondary | ICD-10-CM | POA: Diagnosis not present

## 2024-02-16 DIAGNOSIS — E1151 Type 2 diabetes mellitus with diabetic peripheral angiopathy without gangrene: Secondary | ICD-10-CM | POA: Diagnosis not present

## 2024-02-16 DIAGNOSIS — K589 Irritable bowel syndrome without diarrhea: Secondary | ICD-10-CM | POA: Diagnosis not present

## 2024-02-16 DIAGNOSIS — J939 Pneumothorax, unspecified: Secondary | ICD-10-CM | POA: Diagnosis not present

## 2024-02-16 DIAGNOSIS — M51369 Other intervertebral disc degeneration, lumbar region without mention of lumbar back pain or lower extremity pain: Secondary | ICD-10-CM | POA: Diagnosis not present

## 2024-02-16 DIAGNOSIS — J4489 Other specified chronic obstructive pulmonary disease: Secondary | ICD-10-CM | POA: Diagnosis not present

## 2024-02-16 DIAGNOSIS — N183 Chronic kidney disease, stage 3 unspecified: Secondary | ICD-10-CM | POA: Diagnosis not present

## 2024-02-16 DIAGNOSIS — E1165 Type 2 diabetes mellitus with hyperglycemia: Secondary | ICD-10-CM | POA: Diagnosis not present

## 2024-02-16 DIAGNOSIS — E039 Hypothyroidism, unspecified: Secondary | ICD-10-CM | POA: Diagnosis not present

## 2024-02-16 DIAGNOSIS — M800AXD Age-related osteoporosis with current pathological fracture, other site, subsequent encounter for fracture with routine healing: Secondary | ICD-10-CM | POA: Diagnosis not present

## 2024-02-16 DIAGNOSIS — M170 Bilateral primary osteoarthritis of knee: Secondary | ICD-10-CM | POA: Diagnosis not present

## 2024-02-16 DIAGNOSIS — I152 Hypertension secondary to endocrine disorders: Secondary | ICD-10-CM | POA: Diagnosis not present

## 2024-02-17 DIAGNOSIS — G309 Alzheimer's disease, unspecified: Secondary | ICD-10-CM | POA: Diagnosis not present

## 2024-02-17 DIAGNOSIS — R110 Nausea: Secondary | ICD-10-CM | POA: Diagnosis not present

## 2024-02-23 DIAGNOSIS — M800AXD Age-related osteoporosis with current pathological fracture, other site, subsequent encounter for fracture with routine healing: Secondary | ICD-10-CM | POA: Diagnosis not present

## 2024-02-23 DIAGNOSIS — E785 Hyperlipidemia, unspecified: Secondary | ICD-10-CM | POA: Diagnosis not present

## 2024-02-23 DIAGNOSIS — K219 Gastro-esophageal reflux disease without esophagitis: Secondary | ICD-10-CM | POA: Diagnosis not present

## 2024-02-23 DIAGNOSIS — I152 Hypertension secondary to endocrine disorders: Secondary | ICD-10-CM | POA: Diagnosis not present

## 2024-02-23 DIAGNOSIS — E039 Hypothyroidism, unspecified: Secondary | ICD-10-CM | POA: Diagnosis not present

## 2024-02-23 DIAGNOSIS — E1169 Type 2 diabetes mellitus with other specified complication: Secondary | ICD-10-CM | POA: Diagnosis not present

## 2024-02-23 DIAGNOSIS — I471 Supraventricular tachycardia, unspecified: Secondary | ICD-10-CM | POA: Diagnosis not present

## 2024-02-23 DIAGNOSIS — J4489 Other specified chronic obstructive pulmonary disease: Secondary | ICD-10-CM | POA: Diagnosis not present

## 2024-02-23 DIAGNOSIS — J939 Pneumothorax, unspecified: Secondary | ICD-10-CM | POA: Diagnosis not present

## 2024-02-23 DIAGNOSIS — E1122 Type 2 diabetes mellitus with diabetic chronic kidney disease: Secondary | ICD-10-CM | POA: Diagnosis not present

## 2024-02-23 DIAGNOSIS — M51369 Other intervertebral disc degeneration, lumbar region without mention of lumbar back pain or lower extremity pain: Secondary | ICD-10-CM | POA: Diagnosis not present

## 2024-02-23 DIAGNOSIS — K589 Irritable bowel syndrome without diarrhea: Secondary | ICD-10-CM | POA: Diagnosis not present

## 2024-02-23 DIAGNOSIS — M170 Bilateral primary osteoarthritis of knee: Secondary | ICD-10-CM | POA: Diagnosis not present

## 2024-02-23 DIAGNOSIS — M3500 Sicca syndrome, unspecified: Secondary | ICD-10-CM | POA: Diagnosis not present

## 2024-02-23 DIAGNOSIS — E1159 Type 2 diabetes mellitus with other circulatory complications: Secondary | ICD-10-CM | POA: Diagnosis not present

## 2024-02-23 DIAGNOSIS — R1313 Dysphagia, pharyngeal phase: Secondary | ICD-10-CM | POA: Diagnosis not present

## 2024-02-23 DIAGNOSIS — E1165 Type 2 diabetes mellitus with hyperglycemia: Secondary | ICD-10-CM | POA: Diagnosis not present

## 2024-02-23 DIAGNOSIS — G4700 Insomnia, unspecified: Secondary | ICD-10-CM | POA: Diagnosis not present

## 2024-02-23 DIAGNOSIS — N183 Chronic kidney disease, stage 3 unspecified: Secondary | ICD-10-CM | POA: Diagnosis not present

## 2024-02-23 DIAGNOSIS — H9193 Unspecified hearing loss, bilateral: Secondary | ICD-10-CM | POA: Diagnosis not present

## 2024-02-23 DIAGNOSIS — E1151 Type 2 diabetes mellitus with diabetic peripheral angiopathy without gangrene: Secondary | ICD-10-CM | POA: Diagnosis not present

## 2024-02-27 DIAGNOSIS — H9193 Unspecified hearing loss, bilateral: Secondary | ICD-10-CM | POA: Diagnosis not present

## 2024-02-27 DIAGNOSIS — K219 Gastro-esophageal reflux disease without esophagitis: Secondary | ICD-10-CM | POA: Diagnosis not present

## 2024-02-27 DIAGNOSIS — N183 Chronic kidney disease, stage 3 unspecified: Secondary | ICD-10-CM | POA: Diagnosis not present

## 2024-02-27 DIAGNOSIS — M170 Bilateral primary osteoarthritis of knee: Secondary | ICD-10-CM | POA: Diagnosis not present

## 2024-02-27 DIAGNOSIS — I152 Hypertension secondary to endocrine disorders: Secondary | ICD-10-CM | POA: Diagnosis not present

## 2024-02-27 DIAGNOSIS — E039 Hypothyroidism, unspecified: Secondary | ICD-10-CM | POA: Diagnosis not present

## 2024-02-27 DIAGNOSIS — J939 Pneumothorax, unspecified: Secondary | ICD-10-CM | POA: Diagnosis not present

## 2024-02-27 DIAGNOSIS — G4700 Insomnia, unspecified: Secondary | ICD-10-CM | POA: Diagnosis not present

## 2024-02-27 DIAGNOSIS — M800AXD Age-related osteoporosis with current pathological fracture, other site, subsequent encounter for fracture with routine healing: Secondary | ICD-10-CM | POA: Diagnosis not present

## 2024-02-27 DIAGNOSIS — E1159 Type 2 diabetes mellitus with other circulatory complications: Secondary | ICD-10-CM | POA: Diagnosis not present

## 2024-02-27 DIAGNOSIS — J4489 Other specified chronic obstructive pulmonary disease: Secondary | ICD-10-CM | POA: Diagnosis not present

## 2024-02-27 DIAGNOSIS — M3500 Sicca syndrome, unspecified: Secondary | ICD-10-CM | POA: Diagnosis not present

## 2024-02-27 DIAGNOSIS — E1122 Type 2 diabetes mellitus with diabetic chronic kidney disease: Secondary | ICD-10-CM | POA: Diagnosis not present

## 2024-02-27 DIAGNOSIS — E785 Hyperlipidemia, unspecified: Secondary | ICD-10-CM | POA: Diagnosis not present

## 2024-02-27 DIAGNOSIS — E1151 Type 2 diabetes mellitus with diabetic peripheral angiopathy without gangrene: Secondary | ICD-10-CM | POA: Diagnosis not present

## 2024-02-27 DIAGNOSIS — E1169 Type 2 diabetes mellitus with other specified complication: Secondary | ICD-10-CM | POA: Diagnosis not present

## 2024-02-27 DIAGNOSIS — R1313 Dysphagia, pharyngeal phase: Secondary | ICD-10-CM | POA: Diagnosis not present

## 2024-02-27 DIAGNOSIS — I471 Supraventricular tachycardia, unspecified: Secondary | ICD-10-CM | POA: Diagnosis not present

## 2024-02-27 DIAGNOSIS — M51369 Other intervertebral disc degeneration, lumbar region without mention of lumbar back pain or lower extremity pain: Secondary | ICD-10-CM | POA: Diagnosis not present

## 2024-02-27 DIAGNOSIS — K589 Irritable bowel syndrome without diarrhea: Secondary | ICD-10-CM | POA: Diagnosis not present

## 2024-02-27 DIAGNOSIS — E1165 Type 2 diabetes mellitus with hyperglycemia: Secondary | ICD-10-CM | POA: Diagnosis not present

## 2024-02-29 DIAGNOSIS — S2241XD Multiple fractures of ribs, right side, subsequent encounter for fracture with routine healing: Secondary | ICD-10-CM | POA: Diagnosis not present

## 2024-02-29 DIAGNOSIS — E113212 Type 2 diabetes mellitus with mild nonproliferative diabetic retinopathy with macular edema, left eye: Secondary | ICD-10-CM | POA: Diagnosis not present

## 2024-02-29 DIAGNOSIS — R946 Abnormal results of thyroid function studies: Secondary | ICD-10-CM | POA: Diagnosis not present

## 2024-02-29 DIAGNOSIS — R29898 Other symptoms and signs involving the musculoskeletal system: Secondary | ICD-10-CM | POA: Diagnosis not present

## 2024-02-29 DIAGNOSIS — N1831 Chronic kidney disease, stage 3a: Secondary | ICD-10-CM | POA: Diagnosis not present

## 2024-02-29 DIAGNOSIS — Z79899 Other long term (current) drug therapy: Secondary | ICD-10-CM | POA: Diagnosis not present

## 2024-02-29 DIAGNOSIS — L219 Seborrheic dermatitis, unspecified: Secondary | ICD-10-CM | POA: Diagnosis not present

## 2024-02-29 DIAGNOSIS — I5032 Chronic diastolic (congestive) heart failure: Secondary | ICD-10-CM | POA: Diagnosis not present

## 2024-02-29 DIAGNOSIS — Z0000 Encounter for general adult medical examination without abnormal findings: Secondary | ICD-10-CM | POA: Diagnosis not present

## 2024-03-05 DIAGNOSIS — M3500 Sicca syndrome, unspecified: Secondary | ICD-10-CM | POA: Diagnosis not present

## 2024-03-05 DIAGNOSIS — E1169 Type 2 diabetes mellitus with other specified complication: Secondary | ICD-10-CM | POA: Diagnosis not present

## 2024-03-05 DIAGNOSIS — E1151 Type 2 diabetes mellitus with diabetic peripheral angiopathy without gangrene: Secondary | ICD-10-CM | POA: Diagnosis not present

## 2024-03-05 DIAGNOSIS — R1313 Dysphagia, pharyngeal phase: Secondary | ICD-10-CM | POA: Diagnosis not present

## 2024-03-05 DIAGNOSIS — E039 Hypothyroidism, unspecified: Secondary | ICD-10-CM | POA: Diagnosis not present

## 2024-03-05 DIAGNOSIS — H9193 Unspecified hearing loss, bilateral: Secondary | ICD-10-CM | POA: Diagnosis not present

## 2024-03-05 DIAGNOSIS — E1165 Type 2 diabetes mellitus with hyperglycemia: Secondary | ICD-10-CM | POA: Diagnosis not present

## 2024-03-05 DIAGNOSIS — E1159 Type 2 diabetes mellitus with other circulatory complications: Secondary | ICD-10-CM | POA: Diagnosis not present

## 2024-03-05 DIAGNOSIS — I152 Hypertension secondary to endocrine disorders: Secondary | ICD-10-CM | POA: Diagnosis not present

## 2024-03-05 DIAGNOSIS — M51369 Other intervertebral disc degeneration, lumbar region without mention of lumbar back pain or lower extremity pain: Secondary | ICD-10-CM | POA: Diagnosis not present

## 2024-03-05 DIAGNOSIS — N183 Chronic kidney disease, stage 3 unspecified: Secondary | ICD-10-CM | POA: Diagnosis not present

## 2024-03-05 DIAGNOSIS — G4700 Insomnia, unspecified: Secondary | ICD-10-CM | POA: Diagnosis not present

## 2024-03-05 DIAGNOSIS — J4489 Other specified chronic obstructive pulmonary disease: Secondary | ICD-10-CM | POA: Diagnosis not present

## 2024-03-05 DIAGNOSIS — E1122 Type 2 diabetes mellitus with diabetic chronic kidney disease: Secondary | ICD-10-CM | POA: Diagnosis not present

## 2024-03-05 DIAGNOSIS — M800AXD Age-related osteoporosis with current pathological fracture, other site, subsequent encounter for fracture with routine healing: Secondary | ICD-10-CM | POA: Diagnosis not present

## 2024-03-05 DIAGNOSIS — E785 Hyperlipidemia, unspecified: Secondary | ICD-10-CM | POA: Diagnosis not present

## 2024-03-05 DIAGNOSIS — J939 Pneumothorax, unspecified: Secondary | ICD-10-CM | POA: Diagnosis not present

## 2024-03-05 DIAGNOSIS — I471 Supraventricular tachycardia, unspecified: Secondary | ICD-10-CM | POA: Diagnosis not present

## 2024-03-05 DIAGNOSIS — M170 Bilateral primary osteoarthritis of knee: Secondary | ICD-10-CM | POA: Diagnosis not present

## 2024-03-05 DIAGNOSIS — K219 Gastro-esophageal reflux disease without esophagitis: Secondary | ICD-10-CM | POA: Diagnosis not present

## 2024-03-05 DIAGNOSIS — K589 Irritable bowel syndrome without diarrhea: Secondary | ICD-10-CM | POA: Diagnosis not present

## 2024-03-09 DIAGNOSIS — K589 Irritable bowel syndrome without diarrhea: Secondary | ICD-10-CM | POA: Diagnosis not present

## 2024-03-09 DIAGNOSIS — H9193 Unspecified hearing loss, bilateral: Secondary | ICD-10-CM | POA: Diagnosis not present

## 2024-03-09 DIAGNOSIS — E1165 Type 2 diabetes mellitus with hyperglycemia: Secondary | ICD-10-CM | POA: Diagnosis not present

## 2024-03-09 DIAGNOSIS — N183 Chronic kidney disease, stage 3 unspecified: Secondary | ICD-10-CM | POA: Diagnosis not present

## 2024-03-09 DIAGNOSIS — M170 Bilateral primary osteoarthritis of knee: Secondary | ICD-10-CM | POA: Diagnosis not present

## 2024-03-09 DIAGNOSIS — M51369 Other intervertebral disc degeneration, lumbar region without mention of lumbar back pain or lower extremity pain: Secondary | ICD-10-CM | POA: Diagnosis not present

## 2024-03-09 DIAGNOSIS — E039 Hypothyroidism, unspecified: Secondary | ICD-10-CM | POA: Diagnosis not present

## 2024-03-09 DIAGNOSIS — E1151 Type 2 diabetes mellitus with diabetic peripheral angiopathy without gangrene: Secondary | ICD-10-CM | POA: Diagnosis not present

## 2024-03-09 DIAGNOSIS — E785 Hyperlipidemia, unspecified: Secondary | ICD-10-CM | POA: Diagnosis not present

## 2024-03-09 DIAGNOSIS — J4489 Other specified chronic obstructive pulmonary disease: Secondary | ICD-10-CM | POA: Diagnosis not present

## 2024-03-09 DIAGNOSIS — R1313 Dysphagia, pharyngeal phase: Secondary | ICD-10-CM | POA: Diagnosis not present

## 2024-03-09 DIAGNOSIS — G4700 Insomnia, unspecified: Secondary | ICD-10-CM | POA: Diagnosis not present

## 2024-03-09 DIAGNOSIS — M3500 Sicca syndrome, unspecified: Secondary | ICD-10-CM | POA: Diagnosis not present

## 2024-03-09 DIAGNOSIS — M800AXD Age-related osteoporosis with current pathological fracture, other site, subsequent encounter for fracture with routine healing: Secondary | ICD-10-CM | POA: Diagnosis not present

## 2024-03-09 DIAGNOSIS — E1169 Type 2 diabetes mellitus with other specified complication: Secondary | ICD-10-CM | POA: Diagnosis not present

## 2024-03-09 DIAGNOSIS — E1159 Type 2 diabetes mellitus with other circulatory complications: Secondary | ICD-10-CM | POA: Diagnosis not present

## 2024-03-09 DIAGNOSIS — I152 Hypertension secondary to endocrine disorders: Secondary | ICD-10-CM | POA: Diagnosis not present

## 2024-03-09 DIAGNOSIS — J939 Pneumothorax, unspecified: Secondary | ICD-10-CM | POA: Diagnosis not present

## 2024-03-09 DIAGNOSIS — E1122 Type 2 diabetes mellitus with diabetic chronic kidney disease: Secondary | ICD-10-CM | POA: Diagnosis not present

## 2024-03-09 DIAGNOSIS — I471 Supraventricular tachycardia, unspecified: Secondary | ICD-10-CM | POA: Diagnosis not present

## 2024-03-09 DIAGNOSIS — K219 Gastro-esophageal reflux disease without esophagitis: Secondary | ICD-10-CM | POA: Diagnosis not present

## 2024-03-10 ENCOUNTER — Other Ambulatory Visit: Payer: Self-pay | Admitting: Neurology

## 2024-03-15 DIAGNOSIS — J939 Pneumothorax, unspecified: Secondary | ICD-10-CM | POA: Diagnosis not present

## 2024-03-15 DIAGNOSIS — J4489 Other specified chronic obstructive pulmonary disease: Secondary | ICD-10-CM | POA: Diagnosis not present

## 2024-03-15 DIAGNOSIS — N183 Chronic kidney disease, stage 3 unspecified: Secondary | ICD-10-CM | POA: Diagnosis not present

## 2024-03-15 DIAGNOSIS — H9193 Unspecified hearing loss, bilateral: Secondary | ICD-10-CM | POA: Diagnosis not present

## 2024-03-15 DIAGNOSIS — E1122 Type 2 diabetes mellitus with diabetic chronic kidney disease: Secondary | ICD-10-CM | POA: Diagnosis not present

## 2024-03-15 DIAGNOSIS — I152 Hypertension secondary to endocrine disorders: Secondary | ICD-10-CM | POA: Diagnosis not present

## 2024-03-15 DIAGNOSIS — M800AXD Age-related osteoporosis with current pathological fracture, other site, subsequent encounter for fracture with routine healing: Secondary | ICD-10-CM | POA: Diagnosis not present

## 2024-03-15 DIAGNOSIS — E039 Hypothyroidism, unspecified: Secondary | ICD-10-CM | POA: Diagnosis not present

## 2024-03-15 DIAGNOSIS — E1169 Type 2 diabetes mellitus with other specified complication: Secondary | ICD-10-CM | POA: Diagnosis not present

## 2024-03-15 DIAGNOSIS — M3500 Sicca syndrome, unspecified: Secondary | ICD-10-CM | POA: Diagnosis not present

## 2024-03-15 DIAGNOSIS — M51369 Other intervertebral disc degeneration, lumbar region without mention of lumbar back pain or lower extremity pain: Secondary | ICD-10-CM | POA: Diagnosis not present

## 2024-03-15 DIAGNOSIS — K589 Irritable bowel syndrome without diarrhea: Secondary | ICD-10-CM | POA: Diagnosis not present

## 2024-03-15 DIAGNOSIS — E1159 Type 2 diabetes mellitus with other circulatory complications: Secondary | ICD-10-CM | POA: Diagnosis not present

## 2024-03-15 DIAGNOSIS — M170 Bilateral primary osteoarthritis of knee: Secondary | ICD-10-CM | POA: Diagnosis not present

## 2024-03-15 DIAGNOSIS — E1151 Type 2 diabetes mellitus with diabetic peripheral angiopathy without gangrene: Secondary | ICD-10-CM | POA: Diagnosis not present

## 2024-03-15 DIAGNOSIS — K219 Gastro-esophageal reflux disease without esophagitis: Secondary | ICD-10-CM | POA: Diagnosis not present

## 2024-03-15 DIAGNOSIS — E785 Hyperlipidemia, unspecified: Secondary | ICD-10-CM | POA: Diagnosis not present

## 2024-03-15 DIAGNOSIS — G4700 Insomnia, unspecified: Secondary | ICD-10-CM | POA: Diagnosis not present

## 2024-03-15 DIAGNOSIS — E1165 Type 2 diabetes mellitus with hyperglycemia: Secondary | ICD-10-CM | POA: Diagnosis not present

## 2024-03-15 DIAGNOSIS — I471 Supraventricular tachycardia, unspecified: Secondary | ICD-10-CM | POA: Diagnosis not present

## 2024-03-15 DIAGNOSIS — R1313 Dysphagia, pharyngeal phase: Secondary | ICD-10-CM | POA: Diagnosis not present

## 2024-03-16 DIAGNOSIS — M800AXD Age-related osteoporosis with current pathological fracture, other site, subsequent encounter for fracture with routine healing: Secondary | ICD-10-CM | POA: Diagnosis not present

## 2024-03-16 DIAGNOSIS — E785 Hyperlipidemia, unspecified: Secondary | ICD-10-CM | POA: Diagnosis not present

## 2024-03-16 DIAGNOSIS — M170 Bilateral primary osteoarthritis of knee: Secondary | ICD-10-CM | POA: Diagnosis not present

## 2024-03-16 DIAGNOSIS — M51369 Other intervertebral disc degeneration, lumbar region without mention of lumbar back pain or lower extremity pain: Secondary | ICD-10-CM | POA: Diagnosis not present

## 2024-03-16 DIAGNOSIS — K589 Irritable bowel syndrome without diarrhea: Secondary | ICD-10-CM | POA: Diagnosis not present

## 2024-03-16 DIAGNOSIS — E1159 Type 2 diabetes mellitus with other circulatory complications: Secondary | ICD-10-CM | POA: Diagnosis not present

## 2024-03-16 DIAGNOSIS — K219 Gastro-esophageal reflux disease without esophagitis: Secondary | ICD-10-CM | POA: Diagnosis not present

## 2024-03-16 DIAGNOSIS — E1165 Type 2 diabetes mellitus with hyperglycemia: Secondary | ICD-10-CM | POA: Diagnosis not present

## 2024-03-16 DIAGNOSIS — E1151 Type 2 diabetes mellitus with diabetic peripheral angiopathy without gangrene: Secondary | ICD-10-CM | POA: Diagnosis not present

## 2024-03-16 DIAGNOSIS — E039 Hypothyroidism, unspecified: Secondary | ICD-10-CM | POA: Diagnosis not present

## 2024-03-16 DIAGNOSIS — I152 Hypertension secondary to endocrine disorders: Secondary | ICD-10-CM | POA: Diagnosis not present

## 2024-03-16 DIAGNOSIS — E1169 Type 2 diabetes mellitus with other specified complication: Secondary | ICD-10-CM | POA: Diagnosis not present

## 2024-03-16 DIAGNOSIS — H9193 Unspecified hearing loss, bilateral: Secondary | ICD-10-CM | POA: Diagnosis not present

## 2024-03-16 DIAGNOSIS — J4489 Other specified chronic obstructive pulmonary disease: Secondary | ICD-10-CM | POA: Diagnosis not present

## 2024-03-16 DIAGNOSIS — I471 Supraventricular tachycardia, unspecified: Secondary | ICD-10-CM | POA: Diagnosis not present

## 2024-03-16 DIAGNOSIS — J939 Pneumothorax, unspecified: Secondary | ICD-10-CM | POA: Diagnosis not present

## 2024-03-16 DIAGNOSIS — N183 Chronic kidney disease, stage 3 unspecified: Secondary | ICD-10-CM | POA: Diagnosis not present

## 2024-03-16 DIAGNOSIS — M3500 Sicca syndrome, unspecified: Secondary | ICD-10-CM | POA: Diagnosis not present

## 2024-03-16 DIAGNOSIS — G4700 Insomnia, unspecified: Secondary | ICD-10-CM | POA: Diagnosis not present

## 2024-03-16 DIAGNOSIS — R1313 Dysphagia, pharyngeal phase: Secondary | ICD-10-CM | POA: Diagnosis not present

## 2024-03-16 DIAGNOSIS — E1122 Type 2 diabetes mellitus with diabetic chronic kidney disease: Secondary | ICD-10-CM | POA: Diagnosis not present

## 2024-03-22 DIAGNOSIS — R1313 Dysphagia, pharyngeal phase: Secondary | ICD-10-CM | POA: Diagnosis not present

## 2024-03-22 DIAGNOSIS — E1165 Type 2 diabetes mellitus with hyperglycemia: Secondary | ICD-10-CM | POA: Diagnosis not present

## 2024-03-22 DIAGNOSIS — E1169 Type 2 diabetes mellitus with other specified complication: Secondary | ICD-10-CM | POA: Diagnosis not present

## 2024-03-22 DIAGNOSIS — N183 Chronic kidney disease, stage 3 unspecified: Secondary | ICD-10-CM | POA: Diagnosis not present

## 2024-03-22 DIAGNOSIS — M800AXD Age-related osteoporosis with current pathological fracture, other site, subsequent encounter for fracture with routine healing: Secondary | ICD-10-CM | POA: Diagnosis not present

## 2024-03-22 DIAGNOSIS — K589 Irritable bowel syndrome without diarrhea: Secondary | ICD-10-CM | POA: Diagnosis not present

## 2024-03-22 DIAGNOSIS — M51369 Other intervertebral disc degeneration, lumbar region without mention of lumbar back pain or lower extremity pain: Secondary | ICD-10-CM | POA: Diagnosis not present

## 2024-03-22 DIAGNOSIS — E1151 Type 2 diabetes mellitus with diabetic peripheral angiopathy without gangrene: Secondary | ICD-10-CM | POA: Diagnosis not present

## 2024-03-22 DIAGNOSIS — M3500 Sicca syndrome, unspecified: Secondary | ICD-10-CM | POA: Diagnosis not present

## 2024-03-22 DIAGNOSIS — H9193 Unspecified hearing loss, bilateral: Secondary | ICD-10-CM | POA: Diagnosis not present

## 2024-03-22 DIAGNOSIS — I471 Supraventricular tachycardia, unspecified: Secondary | ICD-10-CM | POA: Diagnosis not present

## 2024-03-22 DIAGNOSIS — E1159 Type 2 diabetes mellitus with other circulatory complications: Secondary | ICD-10-CM | POA: Diagnosis not present

## 2024-03-22 DIAGNOSIS — K219 Gastro-esophageal reflux disease without esophagitis: Secondary | ICD-10-CM | POA: Diagnosis not present

## 2024-03-22 DIAGNOSIS — E785 Hyperlipidemia, unspecified: Secondary | ICD-10-CM | POA: Diagnosis not present

## 2024-03-22 DIAGNOSIS — J939 Pneumothorax, unspecified: Secondary | ICD-10-CM | POA: Diagnosis not present

## 2024-03-22 DIAGNOSIS — E039 Hypothyroidism, unspecified: Secondary | ICD-10-CM | POA: Diagnosis not present

## 2024-03-22 DIAGNOSIS — G4700 Insomnia, unspecified: Secondary | ICD-10-CM | POA: Diagnosis not present

## 2024-03-22 DIAGNOSIS — M170 Bilateral primary osteoarthritis of knee: Secondary | ICD-10-CM | POA: Diagnosis not present

## 2024-03-22 DIAGNOSIS — E1122 Type 2 diabetes mellitus with diabetic chronic kidney disease: Secondary | ICD-10-CM | POA: Diagnosis not present

## 2024-03-22 DIAGNOSIS — I152 Hypertension secondary to endocrine disorders: Secondary | ICD-10-CM | POA: Diagnosis not present

## 2024-03-22 DIAGNOSIS — J4489 Other specified chronic obstructive pulmonary disease: Secondary | ICD-10-CM | POA: Diagnosis not present

## 2024-03-27 DIAGNOSIS — M810 Age-related osteoporosis without current pathological fracture: Secondary | ICD-10-CM | POA: Diagnosis not present

## 2024-03-28 DIAGNOSIS — E1165 Type 2 diabetes mellitus with hyperglycemia: Secondary | ICD-10-CM | POA: Diagnosis not present

## 2024-03-28 DIAGNOSIS — I471 Supraventricular tachycardia, unspecified: Secondary | ICD-10-CM | POA: Diagnosis not present

## 2024-03-28 DIAGNOSIS — E1159 Type 2 diabetes mellitus with other circulatory complications: Secondary | ICD-10-CM | POA: Diagnosis not present

## 2024-03-28 DIAGNOSIS — E039 Hypothyroidism, unspecified: Secondary | ICD-10-CM | POA: Diagnosis not present

## 2024-03-28 DIAGNOSIS — K589 Irritable bowel syndrome without diarrhea: Secondary | ICD-10-CM | POA: Diagnosis not present

## 2024-03-28 DIAGNOSIS — E1151 Type 2 diabetes mellitus with diabetic peripheral angiopathy without gangrene: Secondary | ICD-10-CM | POA: Diagnosis not present

## 2024-03-28 DIAGNOSIS — M3500 Sicca syndrome, unspecified: Secondary | ICD-10-CM | POA: Diagnosis not present

## 2024-03-28 DIAGNOSIS — R1313 Dysphagia, pharyngeal phase: Secondary | ICD-10-CM | POA: Diagnosis not present

## 2024-03-28 DIAGNOSIS — E1169 Type 2 diabetes mellitus with other specified complication: Secondary | ICD-10-CM | POA: Diagnosis not present

## 2024-03-28 DIAGNOSIS — H9193 Unspecified hearing loss, bilateral: Secondary | ICD-10-CM | POA: Diagnosis not present

## 2024-03-28 DIAGNOSIS — E785 Hyperlipidemia, unspecified: Secondary | ICD-10-CM | POA: Diagnosis not present

## 2024-03-28 DIAGNOSIS — M170 Bilateral primary osteoarthritis of knee: Secondary | ICD-10-CM | POA: Diagnosis not present

## 2024-03-28 DIAGNOSIS — J939 Pneumothorax, unspecified: Secondary | ICD-10-CM | POA: Diagnosis not present

## 2024-03-28 DIAGNOSIS — I152 Hypertension secondary to endocrine disorders: Secondary | ICD-10-CM | POA: Diagnosis not present

## 2024-03-28 DIAGNOSIS — G4700 Insomnia, unspecified: Secondary | ICD-10-CM | POA: Diagnosis not present

## 2024-03-28 DIAGNOSIS — M800AXD Age-related osteoporosis with current pathological fracture, other site, subsequent encounter for fracture with routine healing: Secondary | ICD-10-CM | POA: Diagnosis not present

## 2024-03-28 DIAGNOSIS — J4489 Other specified chronic obstructive pulmonary disease: Secondary | ICD-10-CM | POA: Diagnosis not present

## 2024-03-28 DIAGNOSIS — M51369 Other intervertebral disc degeneration, lumbar region without mention of lumbar back pain or lower extremity pain: Secondary | ICD-10-CM | POA: Diagnosis not present

## 2024-03-28 DIAGNOSIS — Z79899 Other long term (current) drug therapy: Secondary | ICD-10-CM | POA: Diagnosis not present

## 2024-03-28 DIAGNOSIS — E1122 Type 2 diabetes mellitus with diabetic chronic kidney disease: Secondary | ICD-10-CM | POA: Diagnosis not present

## 2024-03-28 DIAGNOSIS — K219 Gastro-esophageal reflux disease without esophagitis: Secondary | ICD-10-CM | POA: Diagnosis not present

## 2024-03-28 DIAGNOSIS — N183 Chronic kidney disease, stage 3 unspecified: Secondary | ICD-10-CM | POA: Diagnosis not present

## 2024-03-29 DIAGNOSIS — K589 Irritable bowel syndrome without diarrhea: Secondary | ICD-10-CM | POA: Diagnosis not present

## 2024-03-29 DIAGNOSIS — E1151 Type 2 diabetes mellitus with diabetic peripheral angiopathy without gangrene: Secondary | ICD-10-CM | POA: Diagnosis not present

## 2024-03-29 DIAGNOSIS — J939 Pneumothorax, unspecified: Secondary | ICD-10-CM | POA: Diagnosis not present

## 2024-03-29 DIAGNOSIS — E039 Hypothyroidism, unspecified: Secondary | ICD-10-CM | POA: Diagnosis not present

## 2024-03-29 DIAGNOSIS — M3500 Sicca syndrome, unspecified: Secondary | ICD-10-CM | POA: Diagnosis not present

## 2024-03-29 DIAGNOSIS — E1165 Type 2 diabetes mellitus with hyperglycemia: Secondary | ICD-10-CM | POA: Diagnosis not present

## 2024-03-29 DIAGNOSIS — M170 Bilateral primary osteoarthritis of knee: Secondary | ICD-10-CM | POA: Diagnosis not present

## 2024-03-29 DIAGNOSIS — E785 Hyperlipidemia, unspecified: Secondary | ICD-10-CM | POA: Diagnosis not present

## 2024-03-29 DIAGNOSIS — I471 Supraventricular tachycardia, unspecified: Secondary | ICD-10-CM | POA: Diagnosis not present

## 2024-03-29 DIAGNOSIS — R1313 Dysphagia, pharyngeal phase: Secondary | ICD-10-CM | POA: Diagnosis not present

## 2024-03-29 DIAGNOSIS — G4700 Insomnia, unspecified: Secondary | ICD-10-CM | POA: Diagnosis not present

## 2024-03-29 DIAGNOSIS — K219 Gastro-esophageal reflux disease without esophagitis: Secondary | ICD-10-CM | POA: Diagnosis not present

## 2024-03-29 DIAGNOSIS — I152 Hypertension secondary to endocrine disorders: Secondary | ICD-10-CM | POA: Diagnosis not present

## 2024-03-29 DIAGNOSIS — M51369 Other intervertebral disc degeneration, lumbar region without mention of lumbar back pain or lower extremity pain: Secondary | ICD-10-CM | POA: Diagnosis not present

## 2024-03-29 DIAGNOSIS — M800AXD Age-related osteoporosis with current pathological fracture, other site, subsequent encounter for fracture with routine healing: Secondary | ICD-10-CM | POA: Diagnosis not present

## 2024-03-29 DIAGNOSIS — H9193 Unspecified hearing loss, bilateral: Secondary | ICD-10-CM | POA: Diagnosis not present

## 2024-03-29 DIAGNOSIS — E1159 Type 2 diabetes mellitus with other circulatory complications: Secondary | ICD-10-CM | POA: Diagnosis not present

## 2024-03-29 DIAGNOSIS — E1122 Type 2 diabetes mellitus with diabetic chronic kidney disease: Secondary | ICD-10-CM | POA: Diagnosis not present

## 2024-03-29 DIAGNOSIS — E1169 Type 2 diabetes mellitus with other specified complication: Secondary | ICD-10-CM | POA: Diagnosis not present

## 2024-03-29 DIAGNOSIS — N183 Chronic kidney disease, stage 3 unspecified: Secondary | ICD-10-CM | POA: Diagnosis not present

## 2024-03-29 DIAGNOSIS — J4489 Other specified chronic obstructive pulmonary disease: Secondary | ICD-10-CM | POA: Diagnosis not present

## 2024-04-02 DIAGNOSIS — J939 Pneumothorax, unspecified: Secondary | ICD-10-CM | POA: Diagnosis not present

## 2024-04-02 DIAGNOSIS — E1159 Type 2 diabetes mellitus with other circulatory complications: Secondary | ICD-10-CM | POA: Diagnosis not present

## 2024-04-02 DIAGNOSIS — E1169 Type 2 diabetes mellitus with other specified complication: Secondary | ICD-10-CM | POA: Diagnosis not present

## 2024-04-02 DIAGNOSIS — N183 Chronic kidney disease, stage 3 unspecified: Secondary | ICD-10-CM | POA: Diagnosis not present

## 2024-04-02 DIAGNOSIS — H9193 Unspecified hearing loss, bilateral: Secondary | ICD-10-CM | POA: Diagnosis not present

## 2024-04-02 DIAGNOSIS — E1151 Type 2 diabetes mellitus with diabetic peripheral angiopathy without gangrene: Secondary | ICD-10-CM | POA: Diagnosis not present

## 2024-04-02 DIAGNOSIS — R1313 Dysphagia, pharyngeal phase: Secondary | ICD-10-CM | POA: Diagnosis not present

## 2024-04-02 DIAGNOSIS — M3500 Sicca syndrome, unspecified: Secondary | ICD-10-CM | POA: Diagnosis not present

## 2024-04-02 DIAGNOSIS — E1122 Type 2 diabetes mellitus with diabetic chronic kidney disease: Secondary | ICD-10-CM | POA: Diagnosis not present

## 2024-04-02 DIAGNOSIS — E039 Hypothyroidism, unspecified: Secondary | ICD-10-CM | POA: Diagnosis not present

## 2024-04-02 DIAGNOSIS — G4700 Insomnia, unspecified: Secondary | ICD-10-CM | POA: Diagnosis not present

## 2024-04-02 DIAGNOSIS — M51369 Other intervertebral disc degeneration, lumbar region without mention of lumbar back pain or lower extremity pain: Secondary | ICD-10-CM | POA: Diagnosis not present

## 2024-04-02 DIAGNOSIS — E785 Hyperlipidemia, unspecified: Secondary | ICD-10-CM | POA: Diagnosis not present

## 2024-04-02 DIAGNOSIS — I152 Hypertension secondary to endocrine disorders: Secondary | ICD-10-CM | POA: Diagnosis not present

## 2024-04-02 DIAGNOSIS — J4489 Other specified chronic obstructive pulmonary disease: Secondary | ICD-10-CM | POA: Diagnosis not present

## 2024-04-02 DIAGNOSIS — E1165 Type 2 diabetes mellitus with hyperglycemia: Secondary | ICD-10-CM | POA: Diagnosis not present

## 2024-04-02 DIAGNOSIS — K589 Irritable bowel syndrome without diarrhea: Secondary | ICD-10-CM | POA: Diagnosis not present

## 2024-04-02 DIAGNOSIS — K219 Gastro-esophageal reflux disease without esophagitis: Secondary | ICD-10-CM | POA: Diagnosis not present

## 2024-04-02 DIAGNOSIS — M170 Bilateral primary osteoarthritis of knee: Secondary | ICD-10-CM | POA: Diagnosis not present

## 2024-04-02 DIAGNOSIS — M800AXD Age-related osteoporosis with current pathological fracture, other site, subsequent encounter for fracture with routine healing: Secondary | ICD-10-CM | POA: Diagnosis not present

## 2024-04-02 DIAGNOSIS — I471 Supraventricular tachycardia, unspecified: Secondary | ICD-10-CM | POA: Diagnosis not present

## 2024-04-04 DIAGNOSIS — I8311 Varicose veins of right lower extremity with inflammation: Secondary | ICD-10-CM | POA: Diagnosis not present

## 2024-04-04 DIAGNOSIS — L218 Other seborrheic dermatitis: Secondary | ICD-10-CM | POA: Diagnosis not present

## 2024-04-04 DIAGNOSIS — D1801 Hemangioma of skin and subcutaneous tissue: Secondary | ICD-10-CM | POA: Diagnosis not present

## 2024-04-04 DIAGNOSIS — L853 Xerosis cutis: Secondary | ICD-10-CM | POA: Diagnosis not present

## 2024-04-04 DIAGNOSIS — D225 Melanocytic nevi of trunk: Secondary | ICD-10-CM | POA: Diagnosis not present

## 2024-04-04 DIAGNOSIS — L814 Other melanin hyperpigmentation: Secondary | ICD-10-CM | POA: Diagnosis not present

## 2024-04-04 DIAGNOSIS — L821 Other seborrheic keratosis: Secondary | ICD-10-CM | POA: Diagnosis not present

## 2024-04-04 DIAGNOSIS — I8312 Varicose veins of left lower extremity with inflammation: Secondary | ICD-10-CM | POA: Diagnosis not present

## 2024-04-04 DIAGNOSIS — I872 Venous insufficiency (chronic) (peripheral): Secondary | ICD-10-CM | POA: Diagnosis not present

## 2024-04-05 DIAGNOSIS — J4489 Other specified chronic obstructive pulmonary disease: Secondary | ICD-10-CM | POA: Diagnosis not present

## 2024-04-05 DIAGNOSIS — E1159 Type 2 diabetes mellitus with other circulatory complications: Secondary | ICD-10-CM | POA: Diagnosis not present

## 2024-04-05 DIAGNOSIS — J939 Pneumothorax, unspecified: Secondary | ICD-10-CM | POA: Diagnosis not present

## 2024-04-05 DIAGNOSIS — E1151 Type 2 diabetes mellitus with diabetic peripheral angiopathy without gangrene: Secondary | ICD-10-CM | POA: Diagnosis not present

## 2024-04-05 DIAGNOSIS — E039 Hypothyroidism, unspecified: Secondary | ICD-10-CM | POA: Diagnosis not present

## 2024-04-05 DIAGNOSIS — K219 Gastro-esophageal reflux disease without esophagitis: Secondary | ICD-10-CM | POA: Diagnosis not present

## 2024-04-05 DIAGNOSIS — M51369 Other intervertebral disc degeneration, lumbar region without mention of lumbar back pain or lower extremity pain: Secondary | ICD-10-CM | POA: Diagnosis not present

## 2024-04-05 DIAGNOSIS — E1169 Type 2 diabetes mellitus with other specified complication: Secondary | ICD-10-CM | POA: Diagnosis not present

## 2024-04-05 DIAGNOSIS — M170 Bilateral primary osteoarthritis of knee: Secondary | ICD-10-CM | POA: Diagnosis not present

## 2024-04-05 DIAGNOSIS — H9193 Unspecified hearing loss, bilateral: Secondary | ICD-10-CM | POA: Diagnosis not present

## 2024-04-05 DIAGNOSIS — E1122 Type 2 diabetes mellitus with diabetic chronic kidney disease: Secondary | ICD-10-CM | POA: Diagnosis not present

## 2024-04-05 DIAGNOSIS — R1313 Dysphagia, pharyngeal phase: Secondary | ICD-10-CM | POA: Diagnosis not present

## 2024-04-05 DIAGNOSIS — I152 Hypertension secondary to endocrine disorders: Secondary | ICD-10-CM | POA: Diagnosis not present

## 2024-04-05 DIAGNOSIS — E1165 Type 2 diabetes mellitus with hyperglycemia: Secondary | ICD-10-CM | POA: Diagnosis not present

## 2024-04-05 DIAGNOSIS — K589 Irritable bowel syndrome without diarrhea: Secondary | ICD-10-CM | POA: Diagnosis not present

## 2024-04-05 DIAGNOSIS — M800AXD Age-related osteoporosis with current pathological fracture, other site, subsequent encounter for fracture with routine healing: Secondary | ICD-10-CM | POA: Diagnosis not present

## 2024-04-05 DIAGNOSIS — M3500 Sicca syndrome, unspecified: Secondary | ICD-10-CM | POA: Diagnosis not present

## 2024-04-05 DIAGNOSIS — N183 Chronic kidney disease, stage 3 unspecified: Secondary | ICD-10-CM | POA: Diagnosis not present

## 2024-04-05 DIAGNOSIS — E785 Hyperlipidemia, unspecified: Secondary | ICD-10-CM | POA: Diagnosis not present

## 2024-04-05 DIAGNOSIS — G4700 Insomnia, unspecified: Secondary | ICD-10-CM | POA: Diagnosis not present

## 2024-04-05 DIAGNOSIS — I471 Supraventricular tachycardia, unspecified: Secondary | ICD-10-CM | POA: Diagnosis not present

## 2024-04-19 ENCOUNTER — Ambulatory Visit (INDEPENDENT_AMBULATORY_CARE_PROVIDER_SITE_OTHER): Payer: Medicare Other | Admitting: Neurology

## 2024-04-19 VITALS — BP 153/66 | HR 81 | Ht <= 58 in | Wt 110.0 lb

## 2024-04-19 DIAGNOSIS — F02B0 Dementia in other diseases classified elsewhere, moderate, without behavioral disturbance, psychotic disturbance, mood disturbance, and anxiety: Secondary | ICD-10-CM | POA: Diagnosis not present

## 2024-04-19 DIAGNOSIS — G301 Alzheimer's disease with late onset: Secondary | ICD-10-CM | POA: Diagnosis not present

## 2024-04-19 DIAGNOSIS — R799 Abnormal finding of blood chemistry, unspecified: Secondary | ICD-10-CM | POA: Diagnosis not present

## 2024-04-19 DIAGNOSIS — Z79899 Other long term (current) drug therapy: Secondary | ICD-10-CM | POA: Diagnosis not present

## 2024-04-19 NOTE — Progress Notes (Signed)
 [facility] [address] [place]. [place] [address] [phone]       OFFICE FOLLOW-UP VISIT NOTE  Ms. [name] Date of Birth:  10/12/1941 Medical Record Number:  [id number]   Referring MD: Tressia Fry  Reason for Referral: Dementia  HPI: Initial visit 11/25/2023 Christina Sellers is a pleasant 83 year old Caucasian lady seen today for initial office consultation visit for dementia.  History is obtained from the patient and her daughter is accompanying her as well as review of electronic medical records.  I personally reviewed pertinent available imaging films in PACS.  She has past medical history of hypothyroidism, hypertension, COPD, anxiety, vitamin D  deficiency, sciatica, gout, peripheral vascular disease, depression, heart disease, vascular necrosis of the hip and chronic kidney disease and prediabetes and hyperlipidemia.  Patient has had memory loss and cognitive impairment for the last 6 months as per the daughter which seems to be progressive.  She is living at home but has required increasing support and now has 24-hour caregivers.  She has poor short-term memory and cannot remember recent information.  She also gets confused easily.  She is quite disoriented at times.  She does have sundowning and is more confused at nighttime.  She is able to ambulate independently with a walker.  She does not drive.  She needs help with changing her close and taking a shower.  She can feed herself.  She has not exhibited any anger or violent behavior.  She does not have any delusions or hallucinations.  She does have a half sister who had severe dementia.  She has not had any lab work for reversible causes of memory loss or tried any dementia medications.  MRI scan of the brain on 10/03/2023 personally reviewed shows age-appropriate changes of small vessel disease and mild degree of generalized cerebral atrophy.  Lab work from 07/18/2023 at primary care physician's office shows  normal iron studies, CBC and blood chemistries.  She has no prior history of strokes, TIAs, seizures significant head injury with loss of consciousness. Update 04/19/2024 : She returns for follow-up after last visit 5 months ago.  She is accompanied by her son.  They have not noticed any side effects after starting Namenda  which she is tolerating 10 mg twice daily quite well however did not notice significant cognitive or behavioral improvement as yet.  She continues to have significant short-term memory and cognitive difficulties.  She has 24-hour caregiver support at home.  She is able to ambulate independently.  She has not had any significant delusions hallucinations or unsafe behavior.  She can walk quite well and has not had any falls or injuries.  She did undergo lab work at last visit and vitamin B12 and RPR were normal.  TSH was slightly elevated at 6.58.  She is presently on Synthroid  75 mcg a day with and not sure if primary care physician has increased her dose following this lab result.  Homocystine was also elevated at 26.4 and patient was started on folic acid  1 mg daily which she has been taking but has not yet had for repeat labs checked.  Apoprotein he was negative with patient having 2 copies of E3 allele.  However PET amyloid scan was positive suggesting Alzheimer's related pathology in the brain.  She had an MRI scan of the brain on 02/03/2024 which showed thin 2 to 3 mm tear of subdural hematoma on the right convexity.  On cognitive testing today she did poorly and scored 15/30 and her  son informs me that she had similar score in primary care physician's office a few weeks ago as well. ROS:   14 system review of systems is positive for memory loss, cognitive impairment, gait difficulty, sundowning, confusion and all other systems negative  PMH:  Past Medical History:  Diagnosis Date   Abnormal Pap smear of vagina    Allergic sinusitis    Anxiety    Atherosclerotic peripheral vascular  disease (HCC)    Carotid artery stenosis    Cellulitis    COPD with asthma (HCC)    not an issue now   Depression    Diabetes mellitus type 2, uncomplicated (HCC)    DJD (degenerative joint disease)    Elevated cholesterol    Elevated MCV    secondary to alchol   GERD (gastroesophageal reflux disease)    not an issue now.   Gout    Hearing difficulty    bilateral hearing aids   History of recurrent UTIs    Hypertension    loss weight'off meds now"   Hypothyroidism    IBS (irritable bowel syndrome)    Insomnia    Labial fusion    Osteoarthritis of left knee    Osteopenia    Recurrent vaginitis    Sjogren's syndrome (HCC)    Syncope 10/12/2013   secondary to dehydration and possible hypoglycemia   Tendonitis    Thyroid  disease    Transfusion history    age 71 "anemia"   Vitamin D  deficiency 05/13/2021    Social History:  Social History   Socioeconomic History   Marital status: Widowed    Spouse name: Not on file   Number of children: Not on file   Years of education: Not on file   Highest education level: Not on file  Occupational History   Not on file  Tobacco Use   Smoking status: Former    Current packs/day: 0.00    Types: Cigarettes    Quit date: 02/21/1982    Years since quitting: 42.1   Smokeless tobacco: Never  Vaping Use   Vaping status: Never Used  Substance and Sexual Activity   Alcohol use: No   Drug use: No   Sexual activity: Never    Birth control/protection: Post-menopausal  Other Topics Concern   Not on file  Social History Narrative   Not on file   Social Drivers of Health   Financial Resource Strain: Low Risk  (07/15/2023)   Overall Financial Resource Strain (CARDIA)    Difficulty of Paying Living Expenses: Not hard at all  Food Insecurity: No Food Insecurity (01/29/2024)   Hunger Vital Sign    Worried About Running Out of Food in the Last Year: Never true    Ran Out of Food in the Last Year: Never true  Transportation Needs: No  Transportation Needs (01/29/2024)   PRAPARE - Transportation    Lack of Transportation (Medical): No    Lack of Transportation (Non-Medical): No  Physical Activity: Inactive (07/15/2023)   Exercise Vital Sign    Days of Exercise per Week: 0 days    Minutes of Exercise per Session: 0 min  Stress: Not on file  Social Connections: Not on file  Intimate Partner Violence: Not At Risk (01/29/2024)   Humiliation, Afraid, Rape, and Kick questionnaire    Fear of Current or Ex-Partner: No    Emotionally Abused: No    Physically Abused: No    Sexually Abused: No    Medications:   Current  Outpatient Medications on File Prior to Visit  Medication Sig Dispense Refill   acetaminophen  (TYLENOL ) 650 MG CR tablet Take 650-1,300 mg by mouth every 8 (eight) hours as needed for pain.     amLODipine  (NORVASC ) 5 MG tablet Take 5 mg by mouth 2 (two) times daily.     denosumab  (PROLIA ) 60 MG/ML SOSY injection Inject 60 mg into the skin every 6 (six) months.     diclofenac  sodium (VOLTAREN ) 1 % GEL Apply 1 application topically 2 (two) times daily as needed (to painful sites).     [Paused] empagliflozin (JARDIANCE) 10 MG TABS tablet Take 10 mg by mouth daily.     escitalopram  (LEXAPRO ) 10 MG tablet Take 10 mg by mouth at bedtime.     folic acid  (FOLVITE ) 1 MG tablet Take 1 tablet (1 mg total) by mouth daily. (Patient taking differently: Take 1 mg by mouth at bedtime.) 100 tablet 3   furosemide  (LASIX ) 20 MG tablet Take 1 tablet (20 mg total) by mouth daily.     levothyroxine  (SYNTHROID ) 75 MCG tablet Take 75 mcg by mouth every morning.     memantine  (NAMENDA ) 10 MG tablet TAKE ONE TABLET BY MOUTH TWICE DAILY 60 tablet 3   methocarbamol  (ROBAXIN ) 500 MG tablet Take 0.5 tablets (250 mg total) by mouth every 6 (six) hours as needed for muscle spasms (pain). 30 tablet 0   ondansetron  (ZOFRAN ) 4 MG tablet Take 1 tablet (4 mg total) by mouth every 8 (eight) hours as needed for nausea or vomiting. 30 tablet 0    oxyCODONE  (OXY IR/ROXICODONE ) 5 MG immediate release tablet Take 0.5 tablets (2.5 mg total) by mouth every 6 (six) hours as needed for severe pain (pain score 7-10) or breakthrough pain. 14 tablet 0   pantoprazole  (PROTONIX ) 40 MG tablet Take 40 mg by mouth 2 (two) times daily.     polyethylene glycol powder (GLYCOLAX /MIRALAX ) 17 GM/SCOOP powder Mix as directed and take 17 g by mouth daily. 476 g 0   Polyvinyl Alcohol-Povidone PF (REFRESH) 1.4-0.6 % SOLN Place 1 Application into both eyes as needed (Dry eye).     RESTASIS  0.05 % ophthalmic emulsion Place 1 drop into both eyes 2 (two) times daily.     rosuvastatin  (CRESTOR ) 5 MG tablet Take 5 mg by mouth daily.     senna-docusate (SENOKOT-S) 8.6-50 MG tablet Take 1 tablet by mouth 2 (two) times daily as needed for moderate constipation. 60 tablet 0   [Paused] spironolactone  (ALDACTONE ) 25 MG tablet Take 1 tablet (25 mg total) by mouth daily. 90 tablet 3   sucralfate  (CARAFATE ) 1 g tablet Take 1 g by mouth 2 (two) times daily.     No current facility-administered medications on file prior to visit.    Allergies:   Allergies  Allergen Reactions   Tape Other (See Comments)    SKIN IS VERY THIN- TAPE TEARS VERY EASILY!!   Celexa [Citalopram Hydrobromide] Other (See Comments)    Nightmares    Fluconazole Other (See Comments)    Unknown reaction   Green Tea (Camellia Sinensis)     Other Reaction(s): mouth ulcers   Green Tea Leaf Ext Other (See Comments)    Mouth ulcers   Hydralazine  Hcl     Other Reaction(s): easy bruising   Kenalog  [Triamcinolone ] Other (See Comments)    Spinal injection caused sweats   Levaquin [Levofloxacin] Hives   Losartan Potassium-Hctz Other (See Comments)    Night sweats/ fatigue   Magnesium  Citrate Other (See  Comments)   Methocarbamol  Other (See Comments)    500 mg is too much- can tolerate a lesser dose   Nitrofurantoin  Macrocrystal Other (See Comments)    Possible vertigo   Scopolamine Other (See Comments)     Memory change   Sulfa Antibiotics Other (See Comments)    Reaction not recalled by the patient   Tea     Other reaction(s): Not available   Tramadol      Makes pt feel loopy   Trimethoprim Hives    Physical Exam General: Frail elderly Caucasian lady seated, in no evident distress Head: head normocephalic and atraumatic.   Neck: supple with no carotid or supraclavicular bruits Cardiovascular: regular rate and rhythm, no murmurs Musculoskeletal: no deformity Skin:  no rash/petichiae Vascular:  Normal pulses all extremities  Neurologic Exam Mental Status: Awake and fully alert. Oriented to place and time. Recent and remote memory poor attention span, concentration and fund of knowledge diminished. Mood and affect appropriate.  Mini-Mental status exam score  15/30 ( last visit 20/30.)  With deficits in recall, attention, comprehension, orientation.  Clock drawing 3/4.  Able to name only 4 animals which can walk on 4 legs. Cranial Nerves: Fundoscopic exam reveals not done. Pupils equal, briskly reactive to light. Extraocular movements full without nystagmus. Visual fields full to confrontation. Hearing significantly impaired bilaterally.. Facial sensation intact. Face, tongue, palate moves normally and symmetrically.  Motor: Normal bulk and tone. Normal strength in all tested extremity muscles. Sensory.: intact to touch , pinprick , position and vibratory sensation.  Coordination: Rapid alternating movements normal in all extremities. Finger-to-nose and heel-to-shin performed accurately bilaterally. Gait and Station: Arises from chair without difficulty. Stance is slightly stooped.  Uses a walker.  Gait demonstrates slight apraxia while turning..  Tandem walking not tested.   Reflexes: 1+ and symmetric. Toes downgoing.        04/19/2024    1:22 PM 11/29/2023   11:04 AM  MMSE - Mini Mental State Exam  Orientation to time 0 3  Orientation to Place 4 5  Registration 3 3  Attention/  Calculation 2 0  Attention/Calculation-comments couldnt do numbers   Recall 0 1  Language- name 2 objects 2 2  Language- repeat 1 1  Language- follow 3 step command 2 2  Language- read & follow direction 0 1  Write a sentence 1 1  Copy design 0 1  Total score 15 20     ASSESSMENT: 83 year old pleasant Caucasian lady with 1 year history of progressive cognitive decline and memory loss likely due to  moderate Alzheimer's dementia.     PLAN:I had a long discussion with the patient and her son as well as daughter about her Alzheimer's dementia which appears to have progressed now to moderate range and she is no longer eligible for newer therapies like Kisunla or lequembi. I discussed results of lab results and imaging studies and answered questions.  I recommend she continue Namenda  10 mg twice daily continue 24-hour caregiver support at home.  Repeat homocystine level today and if suboptimal may need to increase dose of folic acid .  Return for follow-up in the future in 6 months or call earlier if necessary. Greater than 50% time during this 45-minute   visit was spent in counseling and coordination of care about her memory loss and cognitive impairment and discussion about evaluation and treatment and answering questions. Ardella Beaver, MD Note: This document was prepared with digital dictation and possible smart phrase technology. Any transcriptional errors  that result from this process are unintentional.

## 2024-04-19 NOTE — Patient Instructions (Signed)
 I had a long discussion with the patient and her son as well as daughter about her Alzheimer's dementia which appears to have progressed now to moderate range and she is no longer eligible for newer therapies like Kisunla or lequembi. I discussed results of lab results and imaging studies and answered questions.  I recommend she continue Namenda  10 mg twice daily continue 24-hour caregiver support at home.  Repeat homocystine level today and if suboptimal may need to increase dose of folic acid .  Return for follow-up in the future in 6 months or call earlier if necessary.

## 2024-04-20 DIAGNOSIS — K589 Irritable bowel syndrome without diarrhea: Secondary | ICD-10-CM | POA: Diagnosis not present

## 2024-04-20 DIAGNOSIS — E1151 Type 2 diabetes mellitus with diabetic peripheral angiopathy without gangrene: Secondary | ICD-10-CM | POA: Diagnosis not present

## 2024-04-20 DIAGNOSIS — M800AXD Age-related osteoporosis with current pathological fracture, other site, subsequent encounter for fracture with routine healing: Secondary | ICD-10-CM | POA: Diagnosis not present

## 2024-04-20 DIAGNOSIS — K219 Gastro-esophageal reflux disease without esophagitis: Secondary | ICD-10-CM | POA: Diagnosis not present

## 2024-04-20 DIAGNOSIS — E1169 Type 2 diabetes mellitus with other specified complication: Secondary | ICD-10-CM | POA: Diagnosis not present

## 2024-04-20 DIAGNOSIS — I700 Atherosclerosis of aorta: Secondary | ICD-10-CM | POA: Diagnosis not present

## 2024-04-20 DIAGNOSIS — E785 Hyperlipidemia, unspecified: Secondary | ICD-10-CM | POA: Diagnosis not present

## 2024-04-20 DIAGNOSIS — I152 Hypertension secondary to endocrine disorders: Secondary | ICD-10-CM | POA: Diagnosis not present

## 2024-04-20 DIAGNOSIS — E039 Hypothyroidism, unspecified: Secondary | ICD-10-CM | POA: Diagnosis not present

## 2024-04-20 DIAGNOSIS — J449 Chronic obstructive pulmonary disease, unspecified: Secondary | ICD-10-CM | POA: Diagnosis not present

## 2024-04-20 DIAGNOSIS — E1159 Type 2 diabetes mellitus with other circulatory complications: Secondary | ICD-10-CM | POA: Diagnosis not present

## 2024-04-20 DIAGNOSIS — M170 Bilateral primary osteoarthritis of knee: Secondary | ICD-10-CM | POA: Diagnosis not present

## 2024-04-20 DIAGNOSIS — N183 Chronic kidney disease, stage 3 unspecified: Secondary | ICD-10-CM | POA: Diagnosis not present

## 2024-04-20 DIAGNOSIS — G4700 Insomnia, unspecified: Secondary | ICD-10-CM | POA: Diagnosis not present

## 2024-04-20 DIAGNOSIS — E1122 Type 2 diabetes mellitus with diabetic chronic kidney disease: Secondary | ICD-10-CM | POA: Diagnosis not present

## 2024-04-20 DIAGNOSIS — R1313 Dysphagia, pharyngeal phase: Secondary | ICD-10-CM | POA: Diagnosis not present

## 2024-04-20 DIAGNOSIS — J939 Pneumothorax, unspecified: Secondary | ICD-10-CM | POA: Diagnosis not present

## 2024-04-20 DIAGNOSIS — M3500 Sicca syndrome, unspecified: Secondary | ICD-10-CM | POA: Diagnosis not present

## 2024-04-20 DIAGNOSIS — I471 Supraventricular tachycardia, unspecified: Secondary | ICD-10-CM | POA: Diagnosis not present

## 2024-04-20 DIAGNOSIS — M779 Enthesopathy, unspecified: Secondary | ICD-10-CM | POA: Diagnosis not present

## 2024-04-20 DIAGNOSIS — H9193 Unspecified hearing loss, bilateral: Secondary | ICD-10-CM | POA: Diagnosis not present

## 2024-04-21 ENCOUNTER — Other Ambulatory Visit: Payer: Self-pay | Admitting: Neurology

## 2024-04-21 ENCOUNTER — Ambulatory Visit: Payer: Self-pay | Admitting: Neurology

## 2024-04-21 LAB — HOMOCYSTEINE: Homocysteine: 28.9 umol/L — ABNORMAL HIGH (ref 0.0–21.3)

## 2024-04-21 MED ORDER — CEREFOLIN 6-1-50-5 MG PO TABS: 1.0000 | ORAL_TABLET | Freq: Every day | ORAL | 3 refills | Status: DC

## 2024-04-21 NOTE — Progress Notes (Signed)
 Kindly inform the patient that repeat homocystine level does not seem to have come down with folic acid  1 mg and she may be resistant to it.  I am going to prescribe Cerefolin nac which is a prescription high-dose vitamin which is available from the mail-order pharmacy in [place] .  She will need to call that pharmacy and make payment arrangements to get this medication.  Approximate cost is under $180 for 3 months if she can afford it

## 2024-04-23 NOTE — Telephone Encounter (Signed)
 Spoke w/Pt dtr regarding lab results and provider recommendation for high dose vitamin Cerefolin nac. Dtr asked if provide sent message to PCP - confirmed to dtr. message was sent to PCP. Dtr stated she will check w/[facility] pharmacy for vitamin and let us  know if she wants to use the mail order pharmacy provider recommended.

## 2024-04-23 NOTE — Telephone Encounter (Signed)
-----   Message from Ardella Beaver sent at 04/21/2024  3:36 PM EDT ----- Louann Rous inform the patient that repeat homocystine level does not seem to have come down with folic acid  1 mg and she may be resistant to it.  I am going to prescribe Cerefolin nac which is a prescription high-dose vitamin which is available from the mail-order pharmacy in [place] .  She will need to call that pharmacy and make payment arrangements to get this medication.  Approximate cost is under $180 for 3 months if she can afford it

## 2024-04-26 ENCOUNTER — Ambulatory Visit: Payer: Medicare Other | Attending: Internal Medicine | Admitting: Internal Medicine

## 2024-04-26 VITALS — BP 158/60 | HR 74 | Ht <= 58 in | Wt 112.8 lb

## 2024-04-26 DIAGNOSIS — I700 Atherosclerosis of aorta: Secondary | ICD-10-CM

## 2024-04-26 DIAGNOSIS — I10 Essential (primary) hypertension: Secondary | ICD-10-CM

## 2024-04-26 DIAGNOSIS — I471 Supraventricular tachycardia, unspecified: Secondary | ICD-10-CM | POA: Diagnosis not present

## 2024-04-26 DIAGNOSIS — I6523 Occlusion and stenosis of bilateral carotid arteries: Secondary | ICD-10-CM | POA: Diagnosis not present

## 2024-04-26 DIAGNOSIS — I5032 Chronic diastolic (congestive) heart failure: Secondary | ICD-10-CM | POA: Diagnosis not present

## 2024-04-26 MED ORDER — FUROSEMIDE 20 MG PO TABS: 20.0000 mg | ORAL_TABLET | Freq: Every day | ORAL | 3 refills | Status: DC

## 2024-04-26 NOTE — Progress Notes (Signed)
 Cardiology Office Note:  .    Date:  04/26/2024  ID:  Christina Sellers, DOB Jun 01, 1941, MRN [id number] PCP: Tena Feeling, MD  [facility] Providers Cardiologist:  Jann Melody, MD     CC: HFpEF Follow up   History of Present Illness: Aaron Aas    Antoniette Peake is a 83 y.o. female with syncope and tricuspid regurgitation who presents for cardiovascular follow-up. She is accompanied by her son, Nadean August.  She has a history of syncope, first noted in 2022, which led to her initial consultation. Her syncope episodes have been managed with calcium  channel blockers and thiazide diuretics. She has been under continuous care since then, including during her right hip surgery.  She has tricuspid regurgitation and experiences labile blood pressure, which has been challenging to control. She has been on and off SGLT2 inhibitors as part of her treatment regimen. No current issues with chest pain, breathing, or significant swelling. She wears compression socks three to four days a week to manage swelling and props her feet up when resting. She is currently taking Lasix  20 mg daily.  In 2024, she experienced significant chest pain while [place] the beach, which was a notable event in her medical history. Since late 2024, there has been a significant worsening in her mini mental status exam, indicating cognitive decline.  She values family support highly, which is an important aspect of her care.  Patient's son, Nadean August, has elected to video record the visit with the use of: [employer] iPhone.  Relevant histories: .  Social   She no receives 24/7 care.  Son Nadean August helps (met today) with Grandson who became a Education officer, community.  Twin Daughters Gurney Lefort (lives in [place] and had two kids) and [place].  Celebrates Passover.  Prior to the [place] and [place].  2022:  Out of the hospital after hip surgical.  Has done better of CCB and thiazide 2023: TR improved.  BP high.  Had issues with hydralazine  and increased  norvasc  with Dr. Ayesha Lente. Dr. Ayesha Lente retired.  Moved to Dr. Candi Chafe.  I started low dose MRA.  2024: Started on SGLT2i.  Did not want to start MRA increase; creatinine increased ~ 20% with increase.  Has June ED visit for chest pain- thought to be pericarditis.   Had [facility] admission for chest pain.  Negative work up. Saw HTN clinic.  BP was optimized 2025: MCD progressed to Dementia.  Saw [name].  ROS: As per HPI.   Studies Reviewed: .     Cardiac Studies & Procedures   ______________________________________________________________________________________________   STRESS TESTS  MYOCARDIAL PERFUSION IMAGING 04/14/2018  Narrative  Nuclear stress EF: 77%.  The left ventricular ejection fraction is hyperdynamic (>65%).  There was no ST segment deviation noted during stress.  The study is normal.  This is a low risk study.  Normal pharmacologic nuclear stress test with no evidence for prior infarct or ischemia. Hyperdynamic LVEF.   ECHOCARDIOGRAM  ECHOCARDIOGRAM COMPLETE 01/17/2024  Narrative ECHOCARDIOGRAM REPORT    Patient Name:   Christina Sellers Date of Exam: 01/17/2024 Medical Rec #:  [id number]         Height:       58.0 in Accession #:    [id number]        Weight:       113.0 lb Date of Birth:  09-21-1941         BSA:          1.429 m Patient Age:  82 years          BP:           133/63 mmHg Patient Gender: F                 HR:           58 bpm. Exam Location:  [address]  Procedure: 2D Echo, 3D Echo, Cardiac Doppler and Color Doppler  Indications:    R01.1 Murmur  History:        Patient has prior history of Echocardiogram examinations, most recent 04/08/2022. PAD and CKD; Risk Factors:Hypertension, Diabetes and HLD.  Sonographer:    Juventino Oppenheim RCS Referring Phys: [id number] Djibril Glogowski A Lorell Thibodaux  IMPRESSIONS   1. Left ventricular ejection fraction, by estimation, is 60 to 65%. The left ventricle has normal function. The left ventricle  has no regional wall motion abnormalities. There is moderate concentric left ventricular hypertrophy. Left ventricular diastolic parameters are consistent with Grade I diastolic dysfunction (impaired relaxation). 2. Right ventricular systolic function is normal. The right ventricular size is normal. There is mildly elevated pulmonary artery systolic pressure. 3. Left atrial size was severely dilated. 4. Right atrial size was moderately dilated. 5. The mitral valve is grossly normal. No evidence of mitral valve regurgitation. 6. Tricuspid valve regurgitation is moderate. 7. The aortic valve was not well visualized. Aortic valve regurgitation is not visualized. 8. The inferior vena cava is normal in size with <50% respiratory variability, suggesting right atrial pressure of 8 mmHg.  FINDINGS Left Ventricle: Left ventricular ejection fraction, by estimation, is 60 to 65%. The left ventricle has normal function. The left ventricle has no regional wall motion abnormalities. The left ventricular internal cavity size was normal in size. There is moderate concentric left ventricular hypertrophy. Left ventricular diastolic parameters are consistent with Grade I diastolic dysfunction (impaired relaxation).  Right Ventricle: The right ventricular size is normal. Right ventricular systolic function is normal. There is mildly elevated pulmonary artery systolic pressure. The tricuspid regurgitant velocity is 2.86 m/s, and with an assumed right atrial pressure of 8 mmHg, the estimated right ventricular systolic pressure is 40.7 mmHg.  Left Atrium: Left atrial size was severely dilated.  Right Atrium: Right atrial size was moderately dilated.  Pericardium: There is no evidence of pericardial effusion.  Mitral Valve: The mitral valve is grossly normal. Mild mitral annular calcification. No evidence of mitral valve regurgitation.  Tricuspid Valve: Tricuspid valve regurgitation is moderate.  Aortic Valve:  The aortic valve was not well visualized. Aortic valve regurgitation is not visualized.  Pulmonic Valve: Pulmonic valve regurgitation is trivial.  Aorta: The aortic root and ascending aorta are structurally normal, with no evidence of dilitation.  Venous: The inferior vena cava is normal in size with less than 50% respiratory variability, suggesting right atrial pressure of 8 mmHg.  IAS/Shunts: The interatrial septum was not well visualized.   LEFT VENTRICLE PLAX 2D LVIDd:         3.70 cm   Diastology LVIDs:         2.40 cm   LV e' medial:    5.00 cm/s LV PW:         1.50 cm   LV E/e' medial:  18.3 LV IVS:        1.40 cm   LV e' lateral:   11.20 cm/s LVOT diam:     1.80 cm   LV E/e' lateral: 8.2 LV SV:         61 LV  SV Index:   42 LVOT Area:     2.54 cm  3D Volume EF: 3D EF:        71 % LV EDV:       94 ml LV ESV:       28 ml LV SV:        66 ml  RIGHT VENTRICLE             IVC RV Basal diam:  3.40 cm     IVC diam: 1.10 cm RV S prime:     11.60 cm/s TAPSE (M-mode): 1.9 cm RVSP:           35.7 mmHg  LEFT ATRIUM             Index        RIGHT ATRIUM           Index LA diam:        4.60 cm 3.22 cm/m   RA Pressure: 3.00 mmHg LA Vol (A2C):   77.4 ml 54.17 ml/m  RA Area:     19.10 cm LA Vol (A4C):   68.3 ml 47.80 ml/m  RA Volume:   54.30 ml  38.01 ml/m LA Biplane Vol: 74.8 ml 52.35 ml/m AORTIC VALVE LVOT Vmax:   92.60 cm/s LVOT Vmean:  57.200 cm/s LVOT VTI:    0.238 m  AORTA Ao Root diam: 2.10 cm Ao Asc diam:  2.50 cm  MITRAL VALVE                TRICUSPID VALVE MV Area (PHT):              TR Peak grad:   32.7 mmHg MV Decel Time:              TR Vmax:        286.00 cm/s MV E velocity: 91.70 cm/s   Estimated RAP:  3.00 mmHg MV A velocity: 125.00 cm/s  RVSP:           35.7 mmHg MV E/A ratio:  0.73 SHUNTS Systemic VTI:  0.24 m Systemic Diam: 1.80 cm  Alois Arnt Electronically signed by Alois Arnt Signature Date/Time: 01/17/2024/2:13:07 PM    Final     MONITORS  LONG TERM MONITOR (3-14 DAYS) 05/13/2023  Narrative   Patient had a minimum heart rate of 47 bpm, maximum heart rate of 194 bpm, and average heart rate of 69 bpm.   Predominant underlying rhythm was sinus rhythm.   One 4 beat run on NSVT.  One 11 beat run of slow SVT with artifact.   Isolated PACs were rare (<1.0%).   Isolated PVCs were rare (<1.0%).   Triggered and diary events associated with sinus rhythm.  No malignant arrhythmias.       ______________________________________________________________________________________________      Physical Exam:    VS:  BP (!) 158/60 (BP Location: Left Arm)   Pulse 74   Ht 4\' 10"  (1.473 m)   Wt 112 lb 12.8 oz (51.2 kg)   SpO2 95%   BMI 23.58 kg/m    Wt Readings from Last 3 Encounters:  04/26/24 112 lb 12.8 oz (51.2 kg)  04/19/24 110 lb (49.9 kg)  01/29/24 110 lb 10.7 oz (50.2 kg)    Gen: no distress   Neck: No JVD Cardiac: No Rubs or Gallops, holosystolic Murmur, RRR +2radial pulses Respiratory: Clear to auscultation bilaterally, normal effort, normal  respiratory rate GI: Soft, nontender, non-distended  MS: with compression stockings no edema;  moves all extremities Integument: Skin feels warm Neuro:  At time of evaluation, alert and oriented to person/place/time/situation  Psych: Normal affect, patient feels ok   ASSESSMENT AND PLAN: .    Tricuspid regurgitation HFprEF - Chronic tricuspid regurgitation with a murmur. No procedural intervention planned due to natural history. Focus on medication management to reduce symptoms and risk of heart failure hospitalization. - Continue current medication regimen for symptom management. - Provide additional 20 mg of Lasix  as needed for dyspnea or edema.  Labile blood pressure Labile blood pressure with current slight elevation. Focus on symptom management rather than numerical values. -if hypertensive urgency, will increase norvasc  to 7.5 dose   Aortic  Atherosclerosis with History of TIA and Hyperlipidemia LDL goal of <70 met on rosuvastatin . Mild carotid artery stenosis managed with aspirin .  - Continue rosuvastatin    PACs and SVT - asymptomatic, no therapy  Dementia Progressive dementia with emphasis on routine, comfort, nutrition, and fall risk management. 24/7 care implemented since August is effective.  Eventually GOC (DNR in particular) will need to be discussed.  I am not sure she is ready to discuss this just yet.  Longitudinal care: The evaluation and management services provided today reflect the complexity inherent in caring for this patient, including the ongoing longitudinal relationship and management of multiple chronic conditions and/or the need for care coordination. The visit required a comprehensive assessment and management plan tailored to the patient's unique needs Time was spent addressing not only the acute concerns but also the broader context of the patient's health, including preventive care, chronic disease management, and care coordination as appropriate.  Complex longitudinal is necessary for conditions including: complex HFpEF and HTN that has changed as AE have decreased but dementia has worsened   Gloriann Larger, MD FASE [facility] Cardiologist [facility]  [address] [place], [address] [place], [place] [address] [phone]  3:06 PM

## 2024-04-26 NOTE — Patient Instructions (Signed)
 Medication Instructions:  Your physician has recommended you make the following change in your medication:  You may take an additional furosemide  (Lasix ) 20 mg daily as needed  *If you need a refill on your cardiac medications before your next appointment, please call your pharmacy*  Lab Work: NONE  If you have labs (blood work) drawn today and your tests are completely normal, you will receive your results only by: MyChart Message (if you have MyChart) OR A paper copy in the mail If you have any lab test that is abnormal or we need to change your treatment, we will call you to review the results.  Testing/Procedures: NONE  Follow-Up: At [facility], you and your health needs are our priority.  As part of our continuing mission to provide you with exceptional heart care, our providers are all part of one team.  This team includes your primary Cardiologist (physician) and Advanced Practice Providers or APPs (Physician Assistants and Nurse Practitioners) who all work together to provide you with the care you need, when you need it.  Your next appointment:   4-5 month(s)  Provider:   Jann Melody, MD

## 2024-05-01 ENCOUNTER — Emergency Department (HOSPITAL_COMMUNITY)

## 2024-05-01 ENCOUNTER — Other Ambulatory Visit: Payer: Self-pay

## 2024-05-01 ENCOUNTER — Encounter (HOSPITAL_COMMUNITY): Payer: Self-pay

## 2024-05-01 ENCOUNTER — Observation Stay (HOSPITAL_COMMUNITY)
Admission: EM | Admit: 2024-05-01 | Discharge: 2024-05-03 | Disposition: A | Discharge status: Home / Self Care | Attending: Internal Medicine | Admitting: Internal Medicine

## 2024-05-01 DIAGNOSIS — I110 Hypertensive heart disease with heart failure: Secondary | ICD-10-CM | POA: Diagnosis not present

## 2024-05-01 DIAGNOSIS — Z87891 Personal history of nicotine dependence: Secondary | ICD-10-CM | POA: Diagnosis not present

## 2024-05-01 DIAGNOSIS — I5033 Acute on chronic diastolic (congestive) heart failure: Principal | ICD-10-CM | POA: Insufficient documentation

## 2024-05-01 DIAGNOSIS — Z8673 Personal history of transient ischemic attack (TIA), and cerebral infarction without residual deficits: Secondary | ICD-10-CM | POA: Diagnosis not present

## 2024-05-01 DIAGNOSIS — J449 Chronic obstructive pulmonary disease, unspecified: Secondary | ICD-10-CM | POA: Diagnosis not present

## 2024-05-01 DIAGNOSIS — J90 Pleural effusion, not elsewhere classified: Secondary | ICD-10-CM | POA: Diagnosis not present

## 2024-05-01 DIAGNOSIS — N1832 Chronic kidney disease, stage 3b: Secondary | ICD-10-CM | POA: Insufficient documentation

## 2024-05-01 DIAGNOSIS — F0280 Dementia in other diseases classified elsewhere without behavioral disturbance: Secondary | ICD-10-CM | POA: Diagnosis not present

## 2024-05-01 DIAGNOSIS — R0602 Shortness of breath: Secondary | ICD-10-CM | POA: Diagnosis not present

## 2024-05-01 DIAGNOSIS — Z79899 Other long term (current) drug therapy: Secondary | ICD-10-CM | POA: Diagnosis not present

## 2024-05-01 DIAGNOSIS — Z96641 Presence of right artificial hip joint: Secondary | ICD-10-CM | POA: Insufficient documentation

## 2024-05-01 DIAGNOSIS — E039 Hypothyroidism, unspecified: Secondary | ICD-10-CM | POA: Diagnosis not present

## 2024-05-01 DIAGNOSIS — I509 Heart failure, unspecified: Principal | ICD-10-CM

## 2024-05-01 DIAGNOSIS — I130 Hypertensive heart and chronic kidney disease with heart failure and stage 1 through stage 4 chronic kidney disease, or unspecified chronic kidney disease: Secondary | ICD-10-CM | POA: Insufficient documentation

## 2024-05-01 DIAGNOSIS — S2241XA Multiple fractures of ribs, right side, initial encounter for closed fracture: Secondary | ICD-10-CM | POA: Diagnosis not present

## 2024-05-01 DIAGNOSIS — E785 Hyperlipidemia, unspecified: Secondary | ICD-10-CM | POA: Insufficient documentation

## 2024-05-01 DIAGNOSIS — I10 Essential (primary) hypertension: Secondary | ICD-10-CM | POA: Diagnosis present

## 2024-05-01 DIAGNOSIS — G301 Alzheimer's disease with late onset: Secondary | ICD-10-CM | POA: Insufficient documentation

## 2024-05-01 DIAGNOSIS — E1122 Type 2 diabetes mellitus with diabetic chronic kidney disease: Secondary | ICD-10-CM | POA: Diagnosis not present

## 2024-05-01 DIAGNOSIS — F02B0 Dementia in other diseases classified elsewhere, moderate, without behavioral disturbance, psychotic disturbance, mood disturbance, and anxiety: Secondary | ICD-10-CM | POA: Diagnosis present

## 2024-05-01 DIAGNOSIS — E876 Hypokalemia: Secondary | ICD-10-CM | POA: Diagnosis not present

## 2024-05-01 DIAGNOSIS — R918 Other nonspecific abnormal finding of lung field: Secondary | ICD-10-CM | POA: Diagnosis not present

## 2024-05-01 LAB — CBC
HCT: 31.7 % — ABNORMAL LOW (ref 36.0–46.0)
Hemoglobin: 10.3 g/dL — ABNORMAL LOW (ref 12.0–15.0)
MCH: 31.3 pg (ref 26.0–34.0)
MCHC: 32.5 g/dL (ref 30.0–36.0)
MCV: 96.4 fL (ref 80.0–100.0)
Platelets: 148 10*3/uL — ABNORMAL LOW (ref 150–400)
RBC: 3.29 MIL/uL — ABNORMAL LOW (ref 3.87–5.11)
RDW: 16 % — ABNORMAL HIGH (ref 11.5–15.5)
WBC: 10.2 10*3/uL (ref 4.0–10.5)
nRBC: 0 % (ref 0.0–0.2)

## 2024-05-01 LAB — BASIC METABOLIC PANEL WITH GFR
Anion gap: 11 (ref 5–15)
BUN: 31 mg/dL — ABNORMAL HIGH (ref 8–23)
CO2: 16 mmol/L — ABNORMAL LOW (ref 22–32)
Calcium: 9.4 mg/dL (ref 8.9–10.3)
Chloride: 107 mmol/L (ref 98–111)
Creatinine, Ser: 1.46 mg/dL — ABNORMAL HIGH (ref 0.44–1.00)
GFR, Estimated: 35 mL/min — ABNORMAL LOW (ref >60)
Glucose, Bld: 146 mg/dL — ABNORMAL HIGH (ref 70–99)
Potassium: 3.5 mmol/L (ref 3.5–5.1)
Sodium: 134 mmol/L — ABNORMAL LOW (ref 135–145)

## 2024-05-01 LAB — BRAIN NATRIURETIC PEPTIDE: B Natriuretic Peptide: 418.5 pg/mL — ABNORMAL HIGH (ref 0.0–100.0)

## 2024-05-01 MED ORDER — ONDANSETRON HCL 4 MG/2ML IJ SOLN
4.0000 mg | Freq: Four times a day (QID) | INTRAMUSCULAR | Status: DC | PRN
  Administered 2024-05-03: 4 mg via INTRAVENOUS
  Filled 2024-05-01: qty 2

## 2024-05-01 MED ORDER — CYCLOSPORINE 0.05 % OP EMUL
1.0000 [drp] | Freq: Two times a day (BID) | OPHTHALMIC | Status: DC
  Administered 2024-05-02 – 2024-05-03 (×3): 1 [drp] via OPHTHALMIC
  Filled 2024-05-01 (×4): qty 30

## 2024-05-01 MED ORDER — ROSUVASTATIN CALCIUM 5 MG PO TABS
5.0000 mg | ORAL_TABLET | Freq: Every day | ORAL | Status: DC
  Administered 2024-05-02 – 2024-05-03 (×2): 5 mg via ORAL
  Filled 2024-05-01 (×2): qty 1

## 2024-05-01 MED ORDER — BISACODYL 10 MG RE SUPP: 10.0000 mg | Freq: Every day | RECTAL | Status: DC | PRN

## 2024-05-01 MED ORDER — HEPARIN SODIUM (PORCINE) 5000 UNIT/ML IJ SOLN
5000.0000 [IU] | Freq: Three times a day (TID) | INTRAMUSCULAR | Status: DC
  Administered 2024-05-02 – 2024-05-03 (×4): 5000 [IU] via SUBCUTANEOUS
  Filled 2024-05-01 (×4): qty 1

## 2024-05-01 MED ORDER — ACETAMINOPHEN 325 MG PO TABS: 650.0000 mg | ORAL_TABLET | Freq: Four times a day (QID) | ORAL | Status: DC | PRN

## 2024-05-01 MED ORDER — FUROSEMIDE 10 MG/ML IJ SOLN
40.0000 mg | Freq: Two times a day (BID) | INTRAMUSCULAR | Status: DC
Start: 2024-05-02 — End: 2024-05-02
  Administered 2024-05-02: 40 mg via INTRAVENOUS
  Filled 2024-05-01: qty 4

## 2024-05-01 MED ORDER — SODIUM CHLORIDE 0.9% FLUSH
3.0000 mL | Freq: Two times a day (BID) | INTRAVENOUS | Status: DC
  Administered 2024-05-01 – 2024-05-03 (×4): 3 mL via INTRAVENOUS

## 2024-05-01 MED ORDER — PANTOPRAZOLE SODIUM 40 MG PO TBEC
40.0000 mg | DELAYED_RELEASE_TABLET | Freq: Two times a day (BID) | ORAL | Status: DC
  Administered 2024-05-02 – 2024-05-03 (×4): 40 mg via ORAL
  Filled 2024-05-01 (×4): qty 1

## 2024-05-01 MED ORDER — MEMANTINE HCL 10 MG PO TABS
10.0000 mg | ORAL_TABLET | Freq: Two times a day (BID) | ORAL | Status: DC
  Administered 2024-05-02 – 2024-05-03 (×4): 10 mg via ORAL
  Filled 2024-05-01 (×4): qty 1

## 2024-05-01 MED ORDER — SENNOSIDES-DOCUSATE SODIUM 8.6-50 MG PO TABS: 1.0000 | ORAL_TABLET | Freq: Every evening | ORAL | Status: DC | PRN

## 2024-05-01 MED ORDER — FUROSEMIDE 10 MG/ML IJ SOLN
40.0000 mg | Freq: Once | INTRAMUSCULAR | Status: AC
  Administered 2024-05-01: 40 mg via INTRAVENOUS
  Filled 2024-05-01: qty 4

## 2024-05-01 MED ORDER — ONDANSETRON HCL 4 MG PO TABS: 4.0000 mg | ORAL_TABLET | Freq: Four times a day (QID) | ORAL | Status: DC | PRN

## 2024-05-01 MED ORDER — AMLODIPINE BESYLATE 5 MG PO TABS
5.0000 mg | ORAL_TABLET | Freq: Two times a day (BID) | ORAL | Status: DC
  Administered 2024-05-02 (×2): 5 mg via ORAL
  Filled 2024-05-01 (×2): qty 1

## 2024-05-01 MED ORDER — POTASSIUM CHLORIDE CRYS ER 20 MEQ PO TBCR
20.0000 meq | EXTENDED_RELEASE_TABLET | Freq: Every day | ORAL | Status: DC
  Administered 2024-05-01: 20 meq via ORAL
  Filled 2024-05-01: qty 1

## 2024-05-01 MED ORDER — LEVOTHYROXINE SODIUM 75 MCG PO TABS
75.0000 ug | ORAL_TABLET | Freq: Every day | ORAL | Status: DC
  Administered 2024-05-02 – 2024-05-03 (×2): 75 ug via ORAL
  Filled 2024-05-01 (×2): qty 1

## 2024-05-01 MED ORDER — ACETAMINOPHEN 650 MG RE SUPP: 650.0000 mg | Freq: Four times a day (QID) | RECTAL | Status: DC | PRN

## 2024-05-01 NOTE — ED Triage Notes (Signed)
 Pt's son reports that pt was seen at Urgent Care and told to come here for lab work. Pt has history of CHF and has had increased swelling in her legs, as well as, SOB.

## 2024-05-01 NOTE — ED Provider Notes (Signed)
 [place] EMERGENCY DEPARTMENT AT [facility] Provider Note   CSN: [id number] Arrival date & time: 05/01/24  1920     History Chief Complaint  Patient presents with   Shortness of Breath    Christina Sellers is a 83 y.o. female COPD, diabetes, hypertension, CHF who presents to the emergency department today with shortness of breath is been progressively worsening over the last couple of days.  Patient gets 24/7 care secondary to dementia.  Son is at the bedside and provides all the history.  He states that she was seen evaluated last week by her PCP who noticed that she was having some increased leg swelling.  She does take 20 mg of Lasix  daily and after seeing her he wanted to upped her to 40 Lasix  daily.  Secondary to shortness of breath she was sent here for further evaluation.  Patient denies any fever, cough, congestion.  Per chart review, patient did have a normal EF back in February on her echo.   Shortness of Breath      Home Medications Prior to Admission medications   Medication Sig Start Date End Date Taking? Authorizing Provider  acetaminophen  (TYLENOL ) 650 MG CR tablet Take 650-1,300 mg by mouth every 8 (eight) hours as needed for pain.    [provider]  amLODipine  (NORVASC ) 5 MG tablet Take 5 mg by mouth 2 (two) times daily.    [provider]  denosumab  (PROLIA ) 60 MG/ML SOSY injection Inject 60 mg into the skin every 6 (six) months.    [provider]  diclofenac  sodium (VOLTAREN ) 1 % GEL Apply 1 application topically 2 (two) times daily as needed (to painful sites). 03/29/19   [provider]  empagliflozin (JARDIANCE) 10 MG TABS tablet Take 10 mg by mouth daily.    [provider]  escitalopram  (LEXAPRO ) 10 MG tablet Take 10 mg by mouth at bedtime. 10/12/23   [provider]  furosemide  (LASIX ) 20 MG tablet Take 1 tablet (20 mg total) by mouth daily. 04/26/24   Chandrasekhar, Arneta Beverage A, MD   L-Methylfolate-B12-B6-B2 (CEREFOLIN) 05-06-49-5 MG TABS Take 1 tablet by mouth daily. 04/21/24   Lisabeth Rider, MD  levothyroxine  (SYNTHROID ) 75 MCG tablet Take 75 mcg by mouth every morning. 07/15/21   [provider]  memantine  (NAMENDA ) 10 MG tablet TAKE ONE TABLET BY MOUTH TWICE DAILY 03/14/24   Sethi, Pramod S, MD  methocarbamol  (ROBAXIN ) 500 MG tablet Take 0.5 tablets (250 mg total) by mouth every 6 (six) hours as needed for muscle spasms (pain). 02/03/24   Rai, Hurman Maiden, MD  ondansetron  (ZOFRAN ) 4 MG tablet Take 1 tablet (4 mg total) by mouth every 8 (eight) hours as needed for nausea or vomiting. 02/03/24   Rai, Hurman Maiden, MD  oxyCODONE  (OXY IR/ROXICODONE ) 5 MG immediate release tablet Take 0.5 tablets (2.5 mg total) by mouth every 6 (six) hours as needed for severe pain (pain score 7-10) or breakthrough pain. 02/03/24   Rai, Hurman Maiden, MD  pantoprazole  (PROTONIX ) 40 MG tablet Take 40 mg by mouth 2 (two) times daily. 01/24/24   [provider]  polyethylene glycol powder (GLYCOLAX /MIRALAX ) 17 GM/SCOOP powder Mix as directed and take 17 g by mouth daily. 02/04/24   Rai, Hurman Maiden, MD  Polyvinyl Alcohol-Povidone PF (REFRESH) 1.4-0.6 % SOLN Place 1 Application into both eyes as needed (Dry eye).    [provider]  RESTASIS  0.05 % ophthalmic emulsion Place 1 drop into both eyes 2 (two) times  daily.    [provider]  rosuvastatin  (CRESTOR ) 5 MG tablet Take 5 mg by mouth daily.    [provider]  senna-docusate (SENOKOT-S) 8.6-50 MG tablet Take 1 tablet by mouth 2 (two) times daily as needed for moderate constipation. 02/03/24   Rai, Hurman Maiden, MD  spironolactone  (ALDACTONE ) 25 MG tablet Take 1 tablet (25 mg total) by mouth daily. 07/06/23   Chandrasekhar, Caretha Chapel, MD  sucralfate  (CARAFATE ) 1 g tablet Take 1 g by mouth 2 (two) times daily.    [provider]      Allergies    Tape, Celexa [citalopram hydrobromide], Fluconazole, Green tea  (camellia sinensis), Green tea leaf ext, Hydralazine  hcl, Kenalog  [triamcinolone ], Levaquin [levofloxacin], Losartan potassium-hctz, Magnesium  citrate, Methocarbamol , Nitrofurantoin  macrocrystal, Scopolamine, Sulfa antibiotics, Tea, Tramadol , and Trimethoprim    Review of Systems   Review of Systems  Respiratory:  Positive for shortness of breath.   All other systems reviewed and are negative.   Physical Exam Updated Vital Signs BP (!) 166/75 (BP Location: Left Arm)   Pulse 83   Temp (!) 97.4 F (36.3 C) (Oral)   Resp 18   Ht 4\' 10"  (1.473 m)   Wt 51.2 kg   SpO2 96%   BMI 23.58 kg/m  Physical Exam Vitals and nursing note reviewed.  Constitutional:      General: She is not in acute distress.    Appearance: Normal appearance.  HENT:     Head: Normocephalic and atraumatic.  Eyes:     General:        Right eye: No discharge.        Left eye: No discharge.  Cardiovascular:     Rate and Rhythm: Normal rate and regular rhythm.     Heart sounds: Murmur heard.     Systolic murmur is present with a grade of 3/6.     Comments:    Pulmonary:     Comments: Normal effort.  Decreased breath sounds bilaterally in the lower bases. Abdominal:     General: Abdomen is flat. Bowel sounds are normal. There is no distension.     Tenderness: There is no abdominal tenderness. There is no guarding or rebound.  Musculoskeletal:        General: Normal range of motion.     Cervical back: Neck supple.     Right lower leg: 2+ Edema present.     Left lower leg: 2+ Edema present.  Skin:    General: Skin is warm and dry.     Findings: No rash.  Neurological:     General: No focal deficit present.     Mental Status: She is alert.  Psychiatric:        Mood and Affect: Mood normal.        Behavior: Behavior normal.     ED Results / Procedures / Treatments   Labs (all labs ordered are listed, but only abnormal results are displayed) Labs Reviewed  BASIC METABOLIC PANEL WITH GFR - Abnormal;  Notable for the following components:      Result Value   Sodium 134 (*)    CO2 16 (*)    Glucose, Bld 146 (*)    BUN 31 (*)    Creatinine, Ser 1.46 (*)    GFR, Estimated 35 (*)    All other components within normal limits  CBC - Abnormal; Notable for the following components:   RBC 3.29 (*)    Hemoglobin 10.3 (*)    HCT  31.7 (*)    RDW 16.0 (*)    Platelets 148 (*)    All other components within normal limits  BRAIN NATRIURETIC PEPTIDE - Abnormal; Notable for the following components:   B Natriuretic Peptide 418.5 (*)    All other components within normal limits    EKG None  Radiology DG Chest 2 View Result Date: 05/01/2024 CLINICAL DATA:  Shortness of breath EXAM: CHEST - 2 VIEW COMPARISON:  05/01/2024, 02/02/2024 non FINDINGS: Cardiomegaly with small right greater than left pleural effusions. Similar right basilar opacity. Multiple right-sided rib fractures. IMPRESSION: Cardiomegaly with small right greater than left pleural effusions. Similar right basilar opacity which may be due to atelectasis or pneumonia. Electronically Signed   By: Esmeralda Hedge M.D.   On: 05/01/2024 21:35    Procedures Procedures    Medications Ordered in ED Medications  furosemide  (LASIX ) injection 40 mg (has no administration in time range)    ED Course/ Medical Decision Making/ A&P Clinical Course as of 05/01/24 2219  Tue May 01, 2024  2213 I spoke with Dr. Lydia Sams with [facility] hospitalist who agrees to admit the patient. [CF]  2215 CBC(!) Anemia seems to be at the patient's baseline.  [CF]  2216 BNP (Order if Patient has history of Heart Failure)(!) Meets criteria for CHF. [CF]  2216 Basic metabolic panel(!) Mild hyponatremia. Creatinine seems to be patient's baseline.  [CF]  2217 DG Chest 2 View I personally ordered and interpreted the study.  There is some evidence of pleural effusions bilaterally.  I do agree with the radiologist interpretation. [CF]    Clinical Course User Index [CF]  Darletta Ehrich, PA-C   {   Click here for ABCD2, HEART and other calculators  Medical Decision Making Christina Sellers is a 83 y.o. female patient who presents to the emergency department today for further evaluation of shortness of breath.  Given that the patient does appear to be clinically volume overloaded.  Chest x-ray does reveal that the patient does have pleural effusions.  Given the patient's age and overall risk factors I do feel the patient would benefit from further evaluation inside the hospital.  Patient is overall well-appearing.  Kidney function appears to be at the patient's baseline.  No clinical signs of anemia.  She is stable for admission.   Amount and/or Complexity of Data Reviewed Labs: ordered. Decision-making details documented in ED Course. Radiology: ordered. Decision-making details documented in ED Course.  Risk Prescription drug management. Decision regarding hospitalization.    Final Clinical Impression(s) / ED Diagnoses Final diagnoses:  Acute on chronic congestive heart failure, unspecified heart failure type [facility])    Rx / DC Orders ED Discharge Orders     None         Olympia Bianchi 05/01/24 2219    Ninetta Basket, MD 05/03/24 [phone]

## 2024-05-01 NOTE — Hospital Course (Signed)
 Christina Sellers is a 83 y.o. female with medical history significant for Alzheimer's dementia, chronic HFpEF (EF 60-65%), moderate TR, pSVT, CKD stage IIIb, history of CVA, HTN, HLD, hypothyroidism who is admitted with acute on chronic HFpEF.

## 2024-05-01 NOTE — H&P (Signed)
 History and Physical    Davis Vannatter NWG:[id number] DOB: May 11, 1941 DOA: 05/01/2024  PCP: Tena Feeling, MD  Patient coming from: Home  I have personally briefly reviewed patient's old medical records in [facility]  Chief Complaint: Shortness of breath  HPI: Christina Sellers is a 83 y.o. female with medical history significant for Alzheimer's dementia, chronic HFpEF (EF 60-65%), moderate TR, pSVT, CKD stage IIIb, history of CVA, HTN, HLD, hypothyroidism who presented to the ED for evaluation of shortness of breath.  Patient reports developing new shortness of breath yesterday.  Dyspnea occurs with activity.  She says she is not having much dyspnea when resting.  She denies cough, chest pain, nausea, vomiting, abdominal pain.  She says that she has some chronic swelling to her lower extremities which was slightly increased from baseline.  She saw her cardiologist, Dr. Paulita Boss, on 5/22 who recommended increase her in her home Lasix  20 mg daily to 40 mg due to increasing lower extremity swelling.  Patient had not taken increased dose of the Lasix  until today.  ED Course  Labs/Imaging on admission: I have personally reviewed following labs and imaging studies.  Initial vitals showed BP 166/75, pulse 83, RR 18, temp 97.4 F, SpO2 96% on room air.  Labs show WBC 10.2, hemoglobin 10.3, platelets 148, sodium 134, potassium 3.5, bicarb 16, BUN 31, creatinine 1.46, serum glucose 146, BNP 418.5.  2 view chest x-ray showed cardiomegaly with small right greater than left pleural effusions.  Small right basilar opacity likely due to atelectasis.  Patient was given IV Lasix  40 mg.  The hospitalist service was consulted to admit.  Review of Systems: All systems reviewed and are negative except as documented in history of present illness above.   Past Medical History:  Diagnosis Date   Abnormal Pap smear of vagina    Allergic sinusitis    Anxiety    Atherosclerotic peripheral  vascular disease (HCC)    Carotid artery stenosis    Cellulitis    COPD with asthma (HCC)    not an issue now   Depression    Diabetes mellitus type 2, uncomplicated (HCC)    DJD (degenerative joint disease)    Elevated cholesterol    Elevated MCV    secondary to alchol   GERD (gastroesophageal reflux disease)    not an issue now.   Gout    Hearing difficulty    bilateral hearing aids   History of recurrent UTIs    Hypertension    loss weight'off meds now"   Hypothyroidism    IBS (irritable bowel syndrome)    Insomnia    Labial fusion    Osteoarthritis of left knee    Osteopenia    Recurrent vaginitis    Sjogren's syndrome (HCC)    Syncope 10/12/2013   secondary to dehydration and possible hypoglycemia   Tendonitis    Thyroid  disease    Transfusion history    age 65 "anemia"   Vitamin D  deficiency 05/13/2021    Past Surgical History:  Procedure Laterality Date   CATARACT EXTRACTION, BILATERAL Bilateral    CHOLECYSTECTOMY     open   COLONOSCOPY WITH PROPOFOL  N/A 03/23/2016   Procedure: COLONOSCOPY WITH PROPOFOL ;  Surgeon: Garrett Kallman, MD;  Location: [facility];  Service: Endoscopy;  Laterality: N/A;   EYE SURGERY     FOOT SURGERY Left    retained hardware   KNEE SURGERY Left    scope    TONSILLECTOMY  TOTAL HIP ARTHROPLASTY Right 03/30/2021   Procedure: RIGHTTOTAL HIP ARTHROPLASTY ANTERIOR APPROACH;  Surgeon: Wendolyn Hamburger, MD;  Location: [facility];  Service: Orthopedics;  Laterality: Right;    Social History: Social History   Tobacco Use   Smoking status: Former    Current packs/day: 0.00    Types: Cigarettes    Quit date: 02/21/1982    Years since quitting: 42.2   Smokeless tobacco: Never  Vaping Use   Vaping status: Never Used  Substance Use Topics   Alcohol use: No   Drug use: No   Allergies  Allergen Reactions   Tape Other (See Comments)    SKIN IS VERY THIN- TAPE TEARS VERY EASILY!!   Celexa [Citalopram Hydrobromide] Other (See  Comments)    Nightmares    Fluconazole Other (See Comments)    Unknown reaction   Green Tea (Camellia Sinensis)     Other Reaction(s): mouth ulcers   Green Tea Leaf Ext Other (See Comments)    Mouth ulcers   Hydralazine  Hcl     Other Reaction(s): easy bruising   Kenalog  [Triamcinolone ] Other (See Comments)    Spinal injection caused sweats   Levaquin [Levofloxacin] Hives   Losartan Potassium-Hctz Other (See Comments)    Night sweats/ fatigue   Magnesium  Citrate Other (See Comments)   Methocarbamol  Other (See Comments)    500 mg is too much- can tolerate a lesser dose   Nitrofurantoin  Macrocrystal Other (See Comments)    Possible vertigo   Scopolamine Other (See Comments)    Memory change   Sulfa Antibiotics Other (See Comments)    Reaction not recalled by the patient   Tea     Other reaction(s): Not available   Tramadol      Makes pt feel loopy   Trimethoprim Hives    Family History  Problem Relation Age of Onset   Diabetes Maternal Aunt    Cancer Father        Oral cancer   Breast cancer Sister        Age 77's   Cancer Brother        Lung cancer     Prior to Admission medications   Medication Sig Start Date End Date Taking? Authorizing Provider  acetaminophen  (TYLENOL ) 650 MG CR tablet Take 650-1,300 mg by mouth every 8 (eight) hours as needed for pain.    [provider]  amLODipine  (NORVASC ) 5 MG tablet Take 5 mg by mouth 2 (two) times daily.    [provider]  denosumab  (PROLIA ) 60 MG/ML SOSY injection Inject 60 mg into the skin every 6 (six) months.    [provider]  diclofenac  sodium (VOLTAREN ) 1 % GEL Apply 1 application topically 2 (two) times daily as needed (to painful sites). 03/29/19   [provider]  empagliflozin (JARDIANCE) 10 MG TABS tablet Take 10 mg by mouth daily.    [provider]  escitalopram  (LEXAPRO ) 10 MG tablet Take 10 mg by mouth at bedtime. 10/12/23   [provider]  furosemide   (LASIX ) 20 MG tablet Take 1 tablet (20 mg total) by mouth daily. 04/26/24   Chandrasekhar, Arneta Beverage A, MD  L-Methylfolate-B12-B6-B2 (CEREFOLIN) 05-06-49-5 MG TABS Take 1 tablet by mouth daily. 04/21/24   Lisabeth Rider, MD  levothyroxine  (SYNTHROID ) 75 MCG tablet Take 75 mcg by mouth every morning. 07/15/21   [provider]  memantine  (NAMENDA ) 10 MG tablet TAKE ONE TABLET BY MOUTH TWICE DAILY 03/14/24   Lisabeth Rider, MD  methocarbamol  (  ROBAXIN ) 500 MG tablet Take 0.5 tablets (250 mg total) by mouth every 6 (six) hours as needed for muscle spasms (pain). 02/03/24   Rai, Hurman Maiden, MD  ondansetron  (ZOFRAN ) 4 MG tablet Take 1 tablet (4 mg total) by mouth every 8 (eight) hours as needed for nausea or vomiting. 02/03/24   Rai, Ripudeep K, MD  oxyCODONE  (OXY IR/ROXICODONE ) 5 MG immediate release tablet Take 0.5 tablets (2.5 mg total) by mouth every 6 (six) hours as needed for severe pain (pain score 7-10) or breakthrough pain. 02/03/24   Rai, Hurman Maiden, MD  pantoprazole  (PROTONIX ) 40 MG tablet Take 40 mg by mouth 2 (two) times daily. 01/24/24   [provider]  polyethylene glycol powder (GLYCOLAX /MIRALAX ) 17 GM/SCOOP powder Mix as directed and take 17 g by mouth daily. 02/04/24   Rai, Hurman Maiden, MD  Polyvinyl Alcohol-Povidone PF (REFRESH) 1.4-0.6 % SOLN Place 1 Application into both eyes as needed (Dry eye).    [provider]  RESTASIS  0.05 % ophthalmic emulsion Place 1 drop into both eyes 2 (two) times daily.    [provider]  rosuvastatin  (CRESTOR ) 5 MG tablet Take 5 mg by mouth daily.    [provider]  senna-docusate (SENOKOT-S) 8.6-50 MG tablet Take 1 tablet by mouth 2 (two) times daily as needed for moderate constipation. 02/03/24   Rai, Hurman Maiden, MD  spironolactone  (ALDACTONE ) 25 MG tablet Take 1 tablet (25 mg total) by mouth daily. 07/06/23   Chandrasekhar, Caretha Chapel, MD  sucralfate  (CARAFATE ) 1 g tablet Take 1 g by mouth 2 (two) times daily.    [provider]    Physical Exam: Vitals:   05/01/24 2223 05/01/24 2227 05/01/24 2300 05/01/24 2340  BP: (!) 188/69  (!) 163/92 (!) 170/69  Pulse: 82 79 80 81  Resp: (!) 22  17 19   Temp:   97.6 F (36.4 C) 97.7 F (36.5 C)  TempSrc:   Oral Oral  SpO2: 92% 99% 100% 99%  Weight:      Height:       Constitutional: Elderly woman resting in bed with head elevated.  NAD, calm, comfortable Eyes: EOMI, ectropion bilateral lower eyelids ENMT: Mucous membranes are moist. Posterior pharynx clear of any exudate or lesions.Normal dentition.  Neck: normal, supple, no masses. Respiratory: Faint bibasilar inspiratory crackles.  Normal respiratory effort. No accessory muscle use.  Cardiovascular: Regular rate and rhythm, no murmurs / rubs / gallops.  +1 bilateral lower extremity edema. 2+ pedal pulses. Abdomen: no tenderness, no masses palpated. Musculoskeletal: no clubbing / cyanosis. No joint deformity upper and lower extremities. Good ROM, no contractures. Normal muscle tone.  Skin: no rashes, lesions, ulcers. No induration Neurologic: Sensation intact. Strength 5/5 in all 4.  Psychiatric: Alert and oriented x 3. Normal mood.   EKG: Personally reviewed. Sinus rhythm, rate 75, no acute ischemic changes.  Motion artifact throughout.  Assessment/Plan Principal Problem:   Acute on chronic heart failure with preserved ejection fraction (HFpEF) (HCC) Active Problems:   Chronic kidney disease, stage 3b (HCC)   Hypothyroidism   Essential hypertension   Moderate late onset Alzheimer's dementia without behavioral disturbance, psychotic disturbance, mood disturbance, or anxiety (HCC)   Christina Sellers is a 83 y.o. female with medical history significant for Alzheimer's dementia, chronic HFpEF (EF 60-65%), moderate TR, pSVT, CKD stage IIIb, history of CVA, HTN, HLD, hypothyroidism who is admitted with acute on chronic HFpEF.  Assessment and Plan: Acute on chronic HFpEF: Presenting with DOE,  elevated  BNP, increasing lower extremity edema, small bilateral pleural effusions on CXR.  TTE on 01/17/2024 showed EF 60-65%, G1DD, moderate TR. - Continue IV Lasix  40 mg daily - Strict I/O's and daily weights - Previously on Jardiance and spironolactone , looks like these have been held the last 3 months due to history of AKI  CKD stage IIIb: Renal function stable.  Monitor with IV diuresis.  Hypertension: Continue amlodipine .  Hypothyroidism: Continue Synthroid .  Hyperlipidemia: Continue rosuvastatin .  Alzheimer's dementia: Mentating well and conversing appropriately.  Continue Namenda .  Delirium and fall precautions.   DVT prophylaxis: heparin injection 5,000 Units Start: 05/02/24 0600 Code Status: Full code, confirmed with patient on admission.  MOST form reviewed. Family Communication: Not present on admission Disposition Plan: Pending clinical progress Consults called: None Severity of Illness: The appropriate patient status for this patient is OBSERVATION. Observation status is judged to be reasonable and necessary in order to provide the required intensity of service to ensure the patient's safety. The patient's presenting symptoms, physical exam findings, and initial radiographic and laboratory data in the context of their medical condition is felt to place them at decreased risk for further clinical deterioration. Furthermore, it is anticipated that the patient will be medically stable for discharge from the hospital within 2 midnights of admission.   Christina Gores MD [facility]  If 7PM-7AM, please contact night-coverage [email or web address]  05/01/2024, 11:56 PM

## 2024-05-02 DIAGNOSIS — I5033 Acute on chronic diastolic (congestive) heart failure: Secondary | ICD-10-CM | POA: Diagnosis not present

## 2024-05-02 LAB — BASIC METABOLIC PANEL WITH GFR
Anion gap: 9 (ref 5–15)
Anion gap: 10 (ref 5–15)
BUN: 29 mg/dL — ABNORMAL HIGH (ref 8–23)
BUN: 27 mg/dL — ABNORMAL HIGH (ref 8–23)
CO2: 18 mmol/L — ABNORMAL LOW (ref 22–32)
CO2: 18 mmol/L — ABNORMAL LOW (ref 22–32)
Calcium: 8.7 mg/dL — ABNORMAL LOW (ref 8.9–10.3)
Calcium: 9 mg/dL (ref 8.9–10.3)
Chloride: 111 mmol/L (ref 98–111)
Chloride: 110 mmol/L (ref 98–111)
Creatinine, Ser: 1.74 mg/dL — ABNORMAL HIGH (ref 0.44–1.00)
Creatinine, Ser: 1.55 mg/dL — ABNORMAL HIGH (ref 0.44–1.00)
GFR, Estimated: 29 mL/min — ABNORMAL LOW (ref >60)
GFR, Estimated: 33 mL/min — ABNORMAL LOW (ref >60)
Glucose, Bld: 98 mg/dL (ref 70–99)
Glucose, Bld: 104 mg/dL — ABNORMAL HIGH (ref 70–99)
Potassium: 2.8 mmol/L — ABNORMAL LOW (ref 3.5–5.1)
Potassium: 3.7 mmol/L (ref 3.5–5.1)
Sodium: 138 mmol/L (ref 135–145)
Sodium: 138 mmol/L (ref 135–145)

## 2024-05-02 LAB — CBC
HCT: 26 % — ABNORMAL LOW (ref 36.0–46.0)
Hemoglobin: 8.7 g/dL — ABNORMAL LOW (ref 12.0–15.0)
MCH: 32.1 pg (ref 26.0–34.0)
MCHC: 33.5 g/dL (ref 30.0–36.0)
MCV: 95.9 fL (ref 80.0–100.0)
Platelets: 108 10*3/uL — ABNORMAL LOW (ref 150–400)
RBC: 2.71 MIL/uL — ABNORMAL LOW (ref 3.87–5.11)
RDW: 15.9 % — ABNORMAL HIGH (ref 11.5–15.5)
WBC: 6.3 10*3/uL (ref 4.0–10.5)
nRBC: 0 % (ref 0.0–0.2)

## 2024-05-02 LAB — MAGNESIUM: Magnesium: 1.7 mg/dL (ref 1.7–2.4)

## 2024-05-02 MED ORDER — MAGNESIUM SULFATE 2 GM/50ML IV SOLN
2.0000 g | Freq: Once | INTRAVENOUS | Status: AC
  Administered 2024-05-02: 2 g via INTRAVENOUS
  Filled 2024-05-02: qty 50

## 2024-05-02 MED ORDER — AMLODIPINE BESYLATE 5 MG PO TABS
7.5000 mg | ORAL_TABLET | Freq: Two times a day (BID) | ORAL | Status: DC
  Administered 2024-05-02 – 2024-05-03 (×2): 7.5 mg via ORAL
  Filled 2024-05-02 (×2): qty 2

## 2024-05-02 MED ORDER — POTASSIUM CHLORIDE CRYS ER 20 MEQ PO TBCR
40.0000 meq | EXTENDED_RELEASE_TABLET | Freq: Once | ORAL | Status: AC
  Administered 2024-05-02: 40 meq via ORAL
  Filled 2024-05-02: qty 2

## 2024-05-02 MED ORDER — POTASSIUM CHLORIDE CRYS ER 20 MEQ PO TBCR
40.0000 meq | EXTENDED_RELEASE_TABLET | ORAL | Status: AC
  Administered 2024-05-02 (×3): 40 meq via ORAL
  Filled 2024-05-02 (×3): qty 2

## 2024-05-02 NOTE — Progress Notes (Signed)
 PROGRESS NOTE    Christina Sellers  VWU:[id number] DOB: 01/31/1941 DOA: 05/01/2024 PCP: Tena Feeling, MD    Brief Narrative:  Christina Sellers is a 83 y.o. female with medical history significant for Alzheimer's dementia, chronic HFpEF (EF 60-65%), moderate TR, pSVT, CKD stage IIIb, history of CVA, HTN, HLD, hypothyroidism who is admitted with acute on chronic HFpEF.    Assessment and Plan: Acute on chronic HFpEF: Presenting with DOE, elevated BNP, increasing lower extremity edema, small bilateral pleural effusions on CXR.  TTE on 01/17/2024 showed EF 60-65%, G1DD, moderate TR. - Continue IV Lasix  40 mg daily - Strict I/O's and daily weights - Previously on Jardiance and spironolactone , looks like these have been held the last 3 months due to history of AKI (was supposed to be addressed at outpatient cards visit)   CKD stage IIIb: -trend Cr   Hypertension: - amlodipine -- increase dose based on cards note due to high BP   Hypothyroidism: - Synthroid .   Hyperlipidemia: - rosuvastatin .   Alzheimer's dementia: Mentating well and conversing appropriately.  Continue Namenda .  Delirium and fall precautions.   Hypokalemia/hypomagnesemia -replete    DVT prophylaxis: heparin  injection 5,000 Units Start: 05/02/24 0600    Code Status: Full Code   Disposition Plan:  Level of care: Telemetry Status is: Observation     Consultants:  none   Subjective: Denies nausea vomiting  Objective: Vitals:   05/02/24 0734 05/02/24 0756 05/02/24 1000 05/02/24 1227  BP: (!) 147/70   (!) 176/62  Pulse: 73   88  Resp: 20   20  Temp: 98.6 F (37 C)   98 F (36.7 C)  TempSrc: Oral   Oral  SpO2: 98%  100% 96%  Weight:  51.9 kg    Height:       No intake or output data in the 24 hours ending 05/02/24 1319 Filed Weights   05/01/24 2150 05/02/24 0756  Weight: 51.2 kg 51.9 kg    Examination:   General: Appearance:    Well developed, well nourished female in no acute distress      Lungs:     respirations unlabored  Heart:    Normal heart rate   MS:   All extremities are intact.    Neurologic:   Pleasant and cooperative       Data Reviewed: I have personally reviewed following labs and imaging studies  CBC: Recent Labs  Lab 05/01/24 2001 05/02/24 0511  WBC 10.2 6.3  HGB 10.3* 8.7*  HCT 31.7* 26.0*  MCV 96.4 95.9  PLT 148* 108*   Basic Metabolic Panel: Recent Labs  Lab 05/01/24 2001 05/02/24 0511  NA 134* 138  K 3.5 2.8*  CL 107 111  CO2 16* 18*  GLUCOSE 146* 98  BUN 31* 29*  CREATININE 1.46* 1.74*  CALCIUM  9.4 8.7*  MG  --  1.7   GFR: Estimated Creatinine Clearance: 17.2 mL/min (A) (by C-G formula based on SCr of 1.74 mg/dL (H)). Liver Function Tests: No results for input(s): "AST", "ALT", "ALKPHOS", "BILITOT", "PROT", "ALBUMIN " in the last 168 hours. No results for input(s): "LIPASE", "AMYLASE" in the last 168 hours. No results for input(s): "AMMONIA" in the last 168 hours. Coagulation Profile: No results for input(s): "INR", "PROTIME" in the last 168 hours. Cardiac Enzymes: No results for input(s): "CKTOTAL", "CKMB", "CKMBINDEX", "TROPONINI" in the last 168 hours. BNP (last 3 results) Recent Labs    06/30/23 1142  PROBNP 1,397*   HbA1C: No results for input(s): "HGBA1C" in  the last 72 hours. CBG: No results for input(s): "GLUCAP" in the last 168 hours. Lipid Profile: No results for input(s): "CHOL", "HDL", "LDLCALC", "TRIG", "CHOLHDL", "LDLDIRECT" in the last 72 hours. Thyroid  Function Tests: No results for input(s): "TSH", "T4TOTAL", "FREET4", "T3FREE", "THYROIDAB" in the last 72 hours. Anemia Panel: No results for input(s): "VITAMINB12", "FOLATE", "FERRITIN", "TIBC", "IRON", "RETICCTPCT" in the last 72 hours. Sepsis Labs: No results for input(s): "PROCALCITON", "LATICACIDVEN" in the last 168 hours.  No results found for this or any previous visit (from the past 240 hours).       Radiology Studies: DG Chest 2  View Result Date: 05/01/2024 CLINICAL DATA:  Shortness of breath EXAM: CHEST - 2 VIEW COMPARISON:  05/01/2024, 02/02/2024 non FINDINGS: Cardiomegaly with small right greater than left pleural effusions. Similar right basilar opacity. Multiple right-sided rib fractures. IMPRESSION: Cardiomegaly with small right greater than left pleural effusions. Similar right basilar opacity which may be due to atelectasis or pneumonia. Electronically Signed   By: Esmeralda Hedge M.D.   On: 05/01/2024 21:35        Scheduled Meds:  amLODipine   7.5 mg Oral BID   cycloSPORINE   1 drop Both Eyes BID   heparin  5,000 Units Subcutaneous Q8H   levothyroxine   75 mcg Oral Q0600   memantine   10 mg Oral BID   pantoprazole   40 mg Oral BID   potassium chloride   40 mEq Oral Q4H   rosuvastatin   5 mg Oral Daily   sodium chloride  flush  3 mL Intravenous Q12H   Continuous Infusions:   LOS: 0 days    Time spent: 45 minutes spent on chart review, discussion with nursing staff, consultants, updating family and interview/physical exam; more than 50% of that time was spent in counseling and/or coordination of care.    Enrigue Harvard, DO [facility] Available via Epic secure chat 7am-7pm After these hours, please refer to coverage provider listed on [email or web address] 05/02/2024, 1:19 PM

## 2024-05-02 NOTE — Evaluation (Signed)
 Physical Therapy Evaluation Patient Details Name: Christina Sellers MRN: [id number] DOB: November 16, 1941 Today's Date: 05/02/2024   SATURATION QUALIFICATIONS: (This note is used to comply with regulatory documentation for home oxygen)  Patient Saturations on Room Air at Rest = 93%  Patient Saturations on Room Air while Ambulating = 96%    History of Present Illness  83 yo female admitted wtih HF. Hx of fall, HF, COPD, DM, CKD, osteopenia, dementia, pSVT, CVA, falls, chronic LE edema, gout  Clinical Impression  On eval, pt was Supv-CGA level assist for mobility. She walked ~75 feet with a RW. Cues provided as needed during session. She tolerated activity well. Will recommend HHPT f/u. Will plan to follow during this hospital stay.  O2: 93% on RA during session.       If plan is discharge home, recommend the following: A little help with walking and/or transfers;A little help with bathing/dressing/bathroom;Assistance with cooking/housework;Assist for transportation;Help with stairs or ramp for entrance   Can travel by private vehicle        Equipment Recommendations None recommended by PT  Recommendations for Other Services       Functional Status Assessment Patient has had a recent decline in their functional status and demonstrates the ability to make significant improvements in function in a reasonable and predictable amount of time.     Precautions / Restrictions Precautions Precautions: Fall Precaution/Restrictions Comments: incontinent Restrictions Weight Bearing Restrictions Per Provider Order: No      Mobility  Bed Mobility               General bed mobility comments: oob in recliner    Transfers Overall transfer level: Needs assistance Equipment used: Rolling walker (2 wheels) Transfers: Sit to/from Stand Sit to Stand: Contact Guard Assist           General transfer comment: cues for safety.    Ambulation/Gait Ambulation/Gait assistance:  Supervision Gait Distance (Feet): 75 Feet Assistive device: Rolling walker (2 wheels) Gait Pattern/deviations: Step-through pattern, Decreased stride length       General Gait Details: supv for safety. O2 96% on RA. pt deneid dizziness, dyspnea. able to converse while ambulating.  Stairs            Wheelchair Mobility     Tilt Bed    Modified Rankin (Stroke Patients Only)       Balance Overall balance assessment: Needs assistance         Standing balance support: During functional activity, Reliant on assistive device for balance Standing balance-Leahy Scale: Fair                               Pertinent Vitals/Pain Pain Assessment Pain Assessment: No/denies pain    Home Living Family/patient expects to be discharged to:: Private residence Living Arrangements: Alone Available Help at Discharge: Personal care attendant;Other (Comment);Available 24 hours/day;Family;Available PRN/intermittently Type of Home: House Home Access: Stairs to enter Entrance Stairs-Rails: Left Entrance Stairs-Number of Steps: 3   Home Layout: One level Home Equipment: Agricultural consultant (2 wheels);Shower seat;Hand held shower head;Grab bars - toilet;Grab bars - tub/shower Additional Comments: per chart review, pt has 24/7 home care. (info taken from previous admission)    Prior Function Prior Level of Function : History of Falls (last six months)             Mobility Comments: mod ind with RW. ADLs Comments: Indep with ADLs, but asks for supervision during showers d/t  fear of falling. Relies on support for transportation. Bubble packs for her medications.     Extremity/Trunk Assessment   Upper Extremity Assessment Upper Extremity Assessment: Generalized weakness    Lower Extremity Assessment Lower Extremity Assessment: Generalized weakness    Cervical / Trunk Assessment Cervical / Trunk Assessment: Kyphotic  Communication   Communication Communication:  Impaired Factors Affecting Communication: Hearing impaired    Cognition Arousal: Alert Behavior During Therapy: WFL for tasks assessed/performed   PT - Cognitive impairments: History of cognitive impairments                                 Cueing Cueing Techniques: Verbal cues     General Comments      Exercises     Assessment/Plan    PT Assessment Patient needs continued PT services  PT Problem List Decreased activity tolerance;Decreased mobility;Decreased cognition;Decreased balance       PT Treatment Interventions DME instruction;Gait training;Therapeutic activities;Therapeutic exercise;Balance training;Functional mobility training;Patient/family education    PT Goals (Current goals can be found in the Care Plan section)  Acute Rehab PT Goals Patient Stated Goal: none stated PT Goal Formulation: With patient Time For Goal Achievement: 05/16/24 Potential to Achieve Goals: Good    Frequency Min 3X/week     Co-evaluation               AM-PAC PT "6 Clicks" Mobility  Outcome Measure Help needed turning from your back to your side while in a flat bed without using bedrails?: A Little Help needed moving from lying on your back to sitting on the side of a flat bed without using bedrails?: A Little Help needed moving to and from a bed to a chair (including a wheelchair)?: A Little Help needed standing up from a chair using your arms (e.g., wheelchair or bedside chair)?: A Little Help needed to walk in hospital room?: A Little Help needed climbing 3-5 steps with a railing? : A Little 6 Click Score: 18    End of Session   Activity Tolerance: Patient tolerated treatment well Patient left: in chair;with call bell/phone within reach (son in hallway on call)   PT Visit Diagnosis: Difficulty in walking, not elsewhere classified (R26.2)    Time: 1610-9604 PT Time Calculation (min) (ACUTE ONLY): 15 min   Charges:   PT Evaluation $PT Eval Low  Complexity: 1 Low   PT General Charges $$ ACUTE PT VISIT: 1 Visit          Tanda Falter, PT Acute Rehabilitation  Office: [phone]

## 2024-05-02 NOTE — Plan of Care (Signed)

## 2024-05-02 NOTE — Care Management Obs Status (Signed)
 MEDICARE OBSERVATION STATUS NOTIFICATION   Patient Details  Name: Christina Sellers MRN: [id number] Date of Birth: 10-16-1941   Medicare Observation Status Notification Given:  Yes    Ruben Corolla, RN 05/02/2024, 2:35 PM

## 2024-05-02 NOTE — TOC Initial Note (Addendum)
 Transition of Care [facility]) - Initial/Assessment Note    Patient Details  Name: Christina Sellers MRN: [id number] Date of Birth: 07/25/1941  Transition of Care [facility]) CM/SW Contact:    Ruben Corolla, RN Phone Number: 05/02/2024, 9:50 AM  Clinical Narrative:  spoke to [name] about d/c plans-d/c plan home. Has rw;private duty aide 24/7. Has own transport home.  Used [facility] in past will use again if needed.                 Barriers to Discharge: Continued Medical Work up   Patient Goals and CMS Choice Patient states their goals for this hospitalization and ongoing recovery are:: Home CMS Medicare.gov Compare Post Acute Care list provided to:: Patient Represenative (must comment) ([name]) Choice offered to / list presented to : Adult Children [place] ownership interest in [facility].provided to:: Adult Children    Expected Discharge Plan and Services   Discharge Planning Services: CM Consult Post Acute Care Choice: Resumption of Svcs/PTA Provider (rw; private duty aide 24/7) Living arrangements for the past 2 months: Single Family Home                                      Prior Living Arrangements/Services Living arrangements for the past 2 months: Single Family Home Lives with:: Adult Children   Do you feel safe going back to the place where you live?: Yes          Current home services: DME (rw;private duty aide 24/7)    Activities of Daily Living   ADL Screening (condition at time of admission) Independently performs ADLs?: Yes (appropriate for developmental age) Is the patient deaf or have difficulty hearing?: No Does the patient have difficulty seeing, even when wearing glasses/contacts?: No Does the patient have difficulty concentrating, remembering, or making decisions?: Yes  Permission Sought/Granted Permission sought to share information with : Case Manager Permission granted to share information with : Yes, Verbal Permission  Granted  Share Information with NAME: Case manager           Emotional Assessment              Admission diagnosis:  Acute on chronic congestive heart failure, unspecified heart failure type (HCC) [I50.9] Acute on chronic heart failure with preserved ejection fraction (HFpEF) (HCC) [I50.33] Patient Active Problem List   Diagnosis Date Noted   Acute on chronic heart failure with preserved ejection fraction (HFpEF) (HCC) 05/01/2024   Moderate late onset Alzheimer's dementia without behavioral disturbance, psychotic disturbance, mood disturbance, or anxiety (HCC) 05/01/2024   Advance care planning 01/30/2024   Chest wall trauma 01/29/2024   Pneumothorax 01/29/2024   Rib fractures 01/29/2024   Paroxysmal SVT (supraventricular tachycardia) (HCC) 06/30/2023   Bilateral carotid artery stenosis 01/21/2023   Abnormal finding of blood chemistry, unspecified 05/13/2021   Acquired hallux valgus 05/13/2021   Age-related osteoporosis without current pathological fracture 05/13/2021   Allergic rhinitis 05/13/2021   Anxiety disorder 05/13/2021   Apnea 05/13/2021   Avascular necrosis of bone of hip (HCC) 05/13/2021   Benign paroxysmal positional vertigo 05/13/2021   Bilateral tinnitus 05/13/2021   Bronchospasm 05/13/2021   Chronic kidney disease, stage 3b (HCC) 05/13/2021   Chronic obstructive pulmonary disease, unspecified (HCC) 05/13/2021   Constipation 05/13/2021   Costochondritis 05/13/2021   Disequilibrium 05/13/2021   Dysphagia 05/13/2021   DOE (dyspnea on exertion) 05/13/2021   Elevated blood-pressure reading, without diagnosis of  hypertension 05/13/2021   Fall 05/13/2021   Fatigue 05/13/2021   Gastro-esophageal reflux disease without esophagitis 05/13/2021   Generalized hyperhidrosis 05/13/2021   Gout 05/13/2021   Hair loss disorder 05/13/2021   Hematochezia 05/13/2021   Hemorrhage of colon due to diverticulosis 05/13/2021   Hypersomnia 05/13/2021   Indigestion 05/13/2021    Labyrinthitis of both ears 05/13/2021   Left flank pain 05/13/2021   Leukocytosis 05/13/2021   Lichen planus 05/13/2021   Localized swelling, mass and lump, neck 05/13/2021   Major depression, single episode 05/13/2021   Memory impairment 05/13/2021   Myalgia 05/13/2021   Night sweats 05/13/2021   Noninflammatory disorder of vagina 05/13/2021   Obstructive sleep apnea syndrome 05/13/2021   Osteoarthritis of knee 05/13/2021   Other long term (current) drug therapy 05/13/2021   Pharyngeal dysphagia 05/13/2021   Sciatica 05/13/2021   Sjogren's syndrome (HCC) 05/13/2021   Sleep disorder 05/13/2021   Status post hip replacement 05/13/2021   Superficial phlebitis 05/13/2021   Tear film insufficiency 05/13/2021   Urinary tract infectious disease 05/13/2021   Urticaria 05/13/2021   Ventral hernia without obstruction or gangrene 05/13/2021   Vitamin D  deficiency 05/13/2021   H/O total hip arthroplasty, right 03/30/2021   Osteoarthritis of right hip 03/25/2021   Orthostatic hypotension 02/27/2021   IBS (irritable bowel syndrome) 02/11/2021   Hyponatremia 02/11/2021   Acute cystitis without hematuria    Chronic back pain    E-coli UTI 02/01/2021   Dehydration with hyponatremia 02/01/2021   Chronic heart failure with preserved ejection fraction (HCC) 02/01/2021   Essential hypertension 01/21/2021   Hyperlipidemia associated with type 2 diabetes mellitus (HCC) 01/21/2021   Aortic atherosclerosis (HCC) 01/21/2021   Chronic low back pain 10/05/2019   Type 2 diabetes mellitus with hyperglycemia, without long-term current use of insulin  (HCC) 02/21/2019   Hypothyroidism 02/21/2019   Pain of left hip joint 12/08/2018   Dermatochalasis of both eyelids 07/04/2018   Viral labyrinthitis 01/13/2017   Acquired spondylolisthesis 04/30/2014   Degeneration of lumbar intervertebral disc 04/30/2014   Osteopenia 11/22/2013   Labial fusion    PCP:  Tena Feeling, MD Pharmacy:   [facility] [place] [facility] [place], [place] - [address] [place] [address] Phone: [phone] Fax: [phone]  [facility] - [place], [place] - [address] [place] [address] Phone: [phone] Fax: [phone]     Social Drivers of Health (SDOH) Social History: SDOH Screenings   Food Insecurity: No Food Insecurity (05/01/2024)  Housing: Low Risk  (05/01/2024)  Transportation Needs: No Transportation Needs (05/01/2024)  Utilities: Not At Risk (05/01/2024)  Alcohol Screen: Low Risk  (07/15/2023)  Financial Resource Strain: Low Risk  (07/15/2023)  Physical Activity: Inactive (07/15/2023)  Social Connections: Patient Unable To Answer (05/02/2024)  Tobacco Use: Medium Risk (05/01/2024)   SDOH Interventions:     Readmission Risk Interventions     No data to display

## 2024-05-02 NOTE — Progress Notes (Signed)
 Heart Failure Navigator Progress Note  Assessed for Heart & Vascular TOC clinic readiness.  Patient does not meet criteria due to per MD note patient with history of Alzheimer's Dementia. No HF TOC. .   Navigator will sign off at this time.   Randie Bustle, BSN, Scientist, clinical (histocompatibility and immunogenetics) Only

## 2024-05-03 DIAGNOSIS — I5033 Acute on chronic diastolic (congestive) heart failure: Secondary | ICD-10-CM | POA: Diagnosis not present

## 2024-05-03 LAB — BASIC METABOLIC PANEL WITH GFR
Anion gap: 8 (ref 5–15)
BUN: 25 mg/dL — ABNORMAL HIGH (ref 8–23)
CO2: 19 mmol/L — ABNORMAL LOW (ref 22–32)
Calcium: 8.8 mg/dL — ABNORMAL LOW (ref 8.9–10.3)
Chloride: 111 mmol/L (ref 98–111)
Creatinine, Ser: 1.46 mg/dL — ABNORMAL HIGH (ref 0.44–1.00)
GFR, Estimated: 35 mL/min — ABNORMAL LOW (ref >60)
Glucose, Bld: 96 mg/dL (ref 70–99)
Potassium: 3.9 mmol/L (ref 3.5–5.1)
Sodium: 138 mmol/L (ref 135–145)

## 2024-05-03 LAB — CBC
HCT: 28 % — ABNORMAL LOW (ref 36.0–46.0)
Hemoglobin: 9.1 g/dL — ABNORMAL LOW (ref 12.0–15.0)
MCH: 32.3 pg (ref 26.0–34.0)
MCHC: 32.5 g/dL (ref 30.0–36.0)
MCV: 99.3 fL (ref 80.0–100.0)
Platelets: 139 10*3/uL — ABNORMAL LOW (ref 150–400)
RBC: 2.82 MIL/uL — ABNORMAL LOW (ref 3.87–5.11)
RDW: 16.5 % — ABNORMAL HIGH (ref 11.5–15.5)
WBC: 8.3 10*3/uL (ref 4.0–10.5)
nRBC: 0 % (ref 0.0–0.2)

## 2024-05-03 MED ORDER — ONDANSETRON HCL 4 MG PO TABS: 4.0000 mg | ORAL_TABLET | Freq: Four times a day (QID) | ORAL | 0 refills | Status: DC | PRN

## 2024-05-03 MED ORDER — AMLODIPINE BESYLATE 2.5 MG PO TABS: 7.5000 mg | ORAL_TABLET | Freq: Two times a day (BID) | ORAL | 1 refills | Status: DC

## 2024-05-03 MED ORDER — FUROSEMIDE 20 MG PO TABS
20.0000 mg | ORAL_TABLET | Freq: Every day | ORAL | Status: DC
  Administered 2024-05-03: 20 mg via ORAL
  Filled 2024-05-03: qty 1

## 2024-05-03 NOTE — TOC Transition Note (Signed)
 Transition of Care [facility] - Discharge Note   Patient Details  Name: Christina Sellers MRN: [id number] Date of Birth: 09/09/1941  Transition [facility]) CM/SW Contact:  Ruben Corolla, RN Phone Number: 05/03/2024, 10:46 AM   Clinical Narrative: [facility] for HHPT. Has own transport home.      Final next level of care: Home w Home Health Services Barriers to Discharge: No Barriers Identified   Patient Goals and CMS Choice Patient states their goals for this hospitalization and ongoing recovery are:: Home CMS Medicare.gov Compare Post Acute Care list provided to:: Patient Represenative (must comment) ([name]) Choice offered to / list presented to : Patient [place] ownership interest in [facility].provided to:: Patient    Discharge Placement                       Discharge Plan and Services Additional resources added to the After Visit Summary for     Discharge Planning Services: CM Consult Post Acute Care Choice: Home Health                    HH Arranged: PT [facility] Agency: [facility] Date [facility] Agency Contacted: 05/03/24 Time HH Agency Contacted: [phone] Representative spoke with at [facility] Agency: Senaida Dama  Social Drivers of Health (SDOH) Interventions SDOH Screenings   Food Insecurity: No Food Insecurity (05/01/2024)  Housing: Low Risk  (05/01/2024)  Transportation Needs: No Transportation Needs (05/01/2024)  Utilities: Not At Risk (05/01/2024)  Alcohol Screen: Low Risk  (07/15/2023)  Financial Resource Strain: Low Risk  (07/15/2023)  Physical Activity: Inactive (07/15/2023)  Social Connections: Patient Unable To Answer (05/02/2024)  Tobacco Use: Medium Risk (05/01/2024)     Readmission Risk Interventions     No data to display

## 2024-05-03 NOTE — Plan of Care (Signed)
   Problem: Safety: Goal: Ability to remain free from injury will improve Outcome: Progressing   Problem: Skin Integrity: Goal: Risk for impaired skin integrity will decrease Outcome: Progressing

## 2024-05-03 NOTE — Discharge Summary (Signed)
 Physician Discharge Summary  Christina Sellers JYN:[id number] DOB: 12/28/1940 DOA: 05/01/2024  PCP: Tena Feeling, MD  Admit date: 05/01/2024 Discharge date: 05/03/2024  Admitted From:  Discharge disposition: Home    Recommendations for Outpatient Follow-Up:   Daily weights-see discharge instructions BMP 1 week-adjust Lasix  as needed Would continue to encourage family to consider palliative care consultation and CODE STATUS with goals of care discussion  Discharge Diagnosis:   Principal Problem:   Acute on chronic heart failure with preserved ejection fraction (HFpEF) (HCC) Active Problems:   Chronic kidney disease, stage 3b (HCC)   Hypothyroidism   Essential hypertension   Moderate late onset Alzheimer's dementia without behavioral disturbance, psychotic disturbance, mood disturbance, or anxiety (HCC)    Discharge Condition: Improved.  Diet recommendation: Low sodium, heart healthy  Wound care: None.  Code status: Full.   History of Present Illness:   Christina Sellers is a 83 y.o. female with medical history significant for Alzheimer's dementia, chronic HFpEF (EF 60-65%), moderate TR, pSVT, CKD stage IIIb, history of CVA, HTN, HLD, hypothyroidism who presented to the ED for evaluation of shortness of breath.   Patient reports developing new shortness of breath yesterday.  Dyspnea occurs with activity.  She says she is not having much dyspnea when resting.  She denies cough, chest pain, nausea, vomiting, abdominal pain.  She says that she has some chronic swelling to her lower extremities which was slightly increased from baseline.  She saw her cardiologist, Dr. Paulita Boss, on 5/22 who recommended increase her in her home Lasix  20 mg daily to 40 mg due to increasing lower extremity swelling.  Patient had not taken increased dose of the Lasix  until today.   ED Course  Labs/Imaging on admission: I have personally reviewed following labs and imaging studies.    Initial vitals showed BP 166/75, pulse 83, RR 18, temp 97.4 F, SpO2 96% on room air.   Labs show WBC 10.2, hemoglobin 10.3, platelets 148, sodium 134, potassium 3.5, bicarb 16, BUN 31, creatinine 1.46, serum glucose 146, BNP 418.5.   2 view chest x-ray showed cardiomegaly with small right greater than left pleural effusions.  Small right basilar opacity likely due to atelectasis.   Patient was given IV Lasix  40 mg.  The hospitalist service was consulted to admit.   Hospital Course by Problem:   Acute on chronic HFpEF: Presenting with DOE, elevated BNP, increasing lower extremity edema, small bilateral pleural effusions on CXR.  TTE on 01/17/2024 showed EF 60-65%, G1DD, moderate TR. - Given IV Lasix  with good diuresis and have been able to wean her off O2-resume p.o. Lasix  with close outpatient follow-up - Previously on Jardiance and spironolactone , looks like these have been held the last 3 months due to history of AKI (to be addressed at outpatient cards visit) -Daily weights   CKD stage IIIb: -trend Cr outpatient and adjust Lasix  as needed   Hypertension: - amlodipine -- increase dose based on cards note due to needed BP   Hypothyroidism: - Synthroid .   Hyperlipidemia: - rosuvastatin .   Alzheimer's dementia: Mentating well and conversing appropriately.  Continue Namenda .  Delirium and fall precautions.   Hypokalemia/hypomagnesemia -repleted    Medical Consultants:      Discharge Exam:    Vitals:   05/03/24 0532 05/03/24 0803 05/03/24 1030 05/03/24 1252  BP: (!) 176/62 (!) 170/67  (!) 153/64  Pulse: 82 77  78  Resp: 18   16  Temp: 98.7 F (37.1 C) 98.3 F (36.8  C)  98.3 F (36.8 C)  TempSrc: Oral Oral  Oral  SpO2: 92% 96% 94%   Weight: 50.1 kg     Height:        General exam: Appears calm and comfortable.   The results of significant diagnostics from this hospitalization (including imaging, microbiology, ancillary and laboratory) are listed below for  reference.     Procedures and Diagnostic Studies:   DG Chest 2 View Result Date: 05/01/2024 CLINICAL DATA:  Shortness of breath EXAM: CHEST - 2 VIEW COMPARISON:  05/01/2024, 02/02/2024 non FINDINGS: Cardiomegaly with small right greater than left pleural effusions. Similar right basilar opacity. Multiple right-sided rib fractures. IMPRESSION: Cardiomegaly with small right greater than left pleural effusions. Similar right basilar opacity which may be due to atelectasis or pneumonia. Electronically Signed   By: Esmeralda Hedge M.D.   On: 05/01/2024 21:35     Labs:   Basic Metabolic Panel: Recent Labs  Lab 05/01/24 2001 05/02/24 0511 05/02/24 1708 05/03/24 0452  NA 134* 138 138 138  K 3.5 2.8* 3.7 3.9  CL 107 111 110 111  CO2 16* 18* 18* 19*  GLUCOSE 146* 98 104* 96  BUN 31* 29* 27* 25*  CREATININE 1.46* 1.74* 1.55* 1.46*  CALCIUM  9.4 8.7* 9.0 8.8*  MG  --  1.7  --   --    GFR Estimated Creatinine Clearance: 20.2 mL/min (A) (by C-G formula based on SCr of 1.46 mg/dL (H)). Liver Function Tests: No results for input(s): "AST", "ALT", "ALKPHOS", "BILITOT", "PROT", "ALBUMIN " in the last 168 hours. No results for input(s): "LIPASE", "AMYLASE" in the last 168 hours. No results for input(s): "AMMONIA" in the last 168 hours. Coagulation profile No results for input(s): "INR", "PROTIME" in the last 168 hours.  CBC: Recent Labs  Lab 05/01/24 2001 05/02/24 0511 05/03/24 0452  WBC 10.2 6.3 8.3  HGB 10.3* 8.7* 9.1*  HCT 31.7* 26.0* 28.0*  MCV 96.4 95.9 99.3  PLT 148* 108* 139*   Cardiac Enzymes: No results for input(s): "CKTOTAL", "CKMB", "CKMBINDEX", "TROPONINI" in the last 168 hours. BNP: Invalid input(s): "POCBNP" CBG: No results for input(s): "GLUCAP" in the last 168 hours. D-Dimer No results for input(s): "DDIMER" in the last 72 hours. Hgb A1c No results for input(s): "HGBA1C" in the last 72 hours. Lipid Profile No results for input(s): "CHOL", "HDL", "LDLCALC",  "TRIG", "CHOLHDL", "LDLDIRECT" in the last 72 hours. Thyroid  function studies No results for input(s): "TSH", "T4TOTAL", "T3FREE", "THYROIDAB" in the last 72 hours.  Invalid input(s): "FREET3" Anemia work up No results for input(s): "VITAMINB12", "FOLATE", "FERRITIN", "TIBC", "IRON", "RETICCTPCT" in the last 72 hours. Microbiology No results found for this or any previous visit (from the past 240 hours).   Discharge Instructions:   Discharge Instructions     (HEART FAILURE PATIENTS) Call MD:  Anytime you have any of the following symptoms: 1) 3 pound weight gain in 24 hours or 5 pounds in 1 week 2) shortness of breath, with or without a dry hacking cough 3) swelling in the hands, feet or stomach 4) if you have to sleep on extra pillows at night in order to breathe.   Complete by: As directed    Diet - low sodium heart healthy   Complete by: As directed    Discharge instructions   Complete by: As directed    Would recommend continuing to speak with PCP/patient and family regarding CODE STATUS and/or palliative care consultation to follow-up outpatient for continued goals of care and recommendations  for DNR CODE STATUS in light of dementia   Heart Failure patients record your daily weight using the same scale at the same time of day   Complete by: As directed    Increase activity slowly   Complete by: As directed    STOP any activity that causes chest pain, shortness of breath, dizziness, sweating, or exessive weakness   Complete by: As directed       Allergies as of 05/03/2024       Reactions   Tape Other (See Comments)   SKIN IS VERY THIN- TAPE TEARS VERY EASILY!!   Celexa [citalopram Hydrobromide] Other (See Comments)   Nightmares   Fluconazole Other (See Comments)   Unknown reaction   Green Tea (camellia Sinensis) Other (See Comments)   mouth ulcers   Green Tea Leaf Ext Other (See Comments)   Mouth ulcers   Hydralazine  Hcl Other (See Comments)   easy bruising   Kenalog   [triamcinolone ] Other (See Comments)   Spinal injection caused sweats   Levaquin [levofloxacin] Hives   Losartan Potassium-hctz Other (See Comments)   Night sweats/ fatigue   Magnesium  Citrate Other (See Comments)   Methocarbamol  Other (See Comments)   500 mg is too much- can tolerate a lesser dose   Nitrofurantoin  Macrocrystal Other (See Comments)   Possible vertigo   Scopolamine Other (See Comments)   Memory change   Sulfa Antibiotics Other (See Comments)   Reaction not recalled by the patient   Tea Other (See Comments)   Not available   Tramadol     Makes pt feel loopy   Trimethoprim Hives        Medication List     PAUSE taking these medications    Jardiance 10 MG Tabs tablet Wait to take this until your doctor or other care provider tells you to start again. Generic drug: empagliflozin Take 10 mg by mouth daily.   spironolactone  25 MG tablet Wait to take this until your doctor or other care provider tells you to start again. Commonly known as: ALDACTONE  Take 1 tablet (25 mg total) by mouth daily.       STOP taking these medications    Cerefolin 05-06-49-5 MG Tabs       TAKE these medications    acetaminophen  650 MG CR tablet Commonly known as: TYLENOL  Take 650-1,300 mg by mouth every 8 (eight) hours as needed for pain.   amLODipine  2.5 MG tablet Commonly known as: NORVASC  Take 3 tablets (7.5 mg total) by mouth 2 (two) times daily. What changed:  medication strength how much to take   escitalopram  5 MG tablet Commonly known as: LEXAPRO  Take 5 mg by mouth daily.   folic acid  1 MG tablet Commonly known as: FOLVITE  Take 1 mg by mouth daily.   furosemide  20 MG tablet Commonly known as: LASIX  Take 1 tablet (20 mg total) by mouth daily.   levothyroxine  75 MCG tablet Commonly known as: SYNTHROID  Take 75 mcg by mouth every morning.   memantine  10 MG tablet Commonly known as: NAMENDA  TAKE ONE TABLET BY MOUTH TWICE DAILY   ondansetron  4 MG  tablet Commonly known as: ZOFRAN  Take 1 tablet (4 mg total) by mouth every 6 (six) hours as needed for nausea.   pantoprazole  40 MG tablet Commonly known as: PROTONIX  Take 40 mg by mouth 2 (two) times daily.   potassium chloride  SA 20 MEQ tablet Commonly known as: KLOR-CON  M Take 20 mEq by mouth 2 (two) times daily.   Refresh  1.4-0.6 % Soln Generic drug: Polyvinyl Alcohol-Povidone PF Place 1 Application into both eyes as needed (Dry eye).   Restasis  0.05 % ophthalmic emulsion Generic drug: cycloSPORINE  Place 1 drop into both eyes 2 (two) times daily.   rosuvastatin  5 MG tablet Commonly known as: CRESTOR  Take 5 mg by mouth daily.   sucralfate  1 g tablet Commonly known as: CARAFATE  Take 1 g by mouth 2 (two) times daily.          Time coordinating discharge: 45 min  Signed:  Enrigue Harvard DO  [facility] 05/03/2024, 1:33 PM

## 2024-05-04 ENCOUNTER — Other Ambulatory Visit: Payer: Self-pay

## 2024-05-04 ENCOUNTER — Emergency Department (HOSPITAL_COMMUNITY)
Admission: EM | Admit: 2024-05-04 | Discharge: 2024-05-04 | Disposition: A | Discharge status: Home / Self Care | Attending: Emergency Medicine | Admitting: Emergency Medicine

## 2024-05-04 ENCOUNTER — Emergency Department (HOSPITAL_COMMUNITY)

## 2024-05-04 DIAGNOSIS — E039 Hypothyroidism, unspecified: Secondary | ICD-10-CM | POA: Insufficient documentation

## 2024-05-04 DIAGNOSIS — N189 Chronic kidney disease, unspecified: Secondary | ICD-10-CM | POA: Insufficient documentation

## 2024-05-04 DIAGNOSIS — I509 Heart failure, unspecified: Secondary | ICD-10-CM | POA: Diagnosis not present

## 2024-05-04 DIAGNOSIS — J90 Pleural effusion, not elsewhere classified: Secondary | ICD-10-CM | POA: Insufficient documentation

## 2024-05-04 DIAGNOSIS — R0902 Hypoxemia: Secondary | ICD-10-CM

## 2024-05-04 DIAGNOSIS — R0989 Other specified symptoms and signs involving the circulatory and respiratory systems: Secondary | ICD-10-CM | POA: Diagnosis not present

## 2024-05-04 DIAGNOSIS — R0602 Shortness of breath: Secondary | ICD-10-CM | POA: Diagnosis not present

## 2024-05-04 DIAGNOSIS — G301 Alzheimer's disease with late onset: Secondary | ICD-10-CM | POA: Insufficient documentation

## 2024-05-04 DIAGNOSIS — J9601 Acute respiratory failure with hypoxia: Secondary | ICD-10-CM | POA: Diagnosis not present

## 2024-05-04 DIAGNOSIS — I517 Cardiomegaly: Secondary | ICD-10-CM | POA: Diagnosis not present

## 2024-05-04 LAB — CBC
HCT: 30.6 % — ABNORMAL LOW (ref 36.0–46.0)
Hemoglobin: 10.1 g/dL — ABNORMAL LOW (ref 12.0–15.0)
MCH: 32.5 pg (ref 26.0–34.0)
MCHC: 33 g/dL (ref 30.0–36.0)
MCV: 98.4 fL (ref 80.0–100.0)
Platelets: 143 10*3/uL — ABNORMAL LOW (ref 150–400)
RBC: 3.11 MIL/uL — ABNORMAL LOW (ref 3.87–5.11)
RDW: 15.9 % — ABNORMAL HIGH (ref 11.5–15.5)
WBC: 8.5 10*3/uL (ref 4.0–10.5)
nRBC: 0 % (ref 0.0–0.2)

## 2024-05-04 LAB — BASIC METABOLIC PANEL WITH GFR
Anion gap: 8 (ref 5–15)
BUN: 22 mg/dL (ref 8–23)
CO2: 19 mmol/L — ABNORMAL LOW (ref 22–32)
Calcium: 9 mg/dL (ref 8.9–10.3)
Chloride: 108 mmol/L (ref 98–111)
Creatinine, Ser: 1.66 mg/dL — ABNORMAL HIGH (ref 0.44–1.00)
GFR, Estimated: 30 mL/min — ABNORMAL LOW (ref >60)
Glucose, Bld: 104 mg/dL — ABNORMAL HIGH (ref 70–99)
Potassium: 3.6 mmol/L (ref 3.5–5.1)
Sodium: 135 mmol/L (ref 135–145)

## 2024-05-04 LAB — BRAIN NATRIURETIC PEPTIDE: B Natriuretic Peptide: 545.3 pg/mL — ABNORMAL HIGH (ref 0.0–100.0)

## 2024-05-04 NOTE — ED Provider Notes (Signed)
 [place] EMERGENCY DEPARTMENT AT [facility] Provider Note   CSN: [id number] Arrival date & time: 05/04/24  0940     History  Chief Complaint  Patient presents with   Shortness of Breath    Pt was discharged yesterday from inpatient, has o2 sensor at home read 82%, pt is sob at this time, has R eye infection. Son states he asked for oxygen tank at home but was denied.     Christina Sellers is a 83 y.o. female with a past medical history of heart failure, chronic kidney disease, hypothyroidism, late onset Alzheimer's who was discharged yesterday from the hospital for acute on chronic heart failure.  She had her Lasix  increased.  At home her home health worker noted that her pulse ox was 82% after she had walked to the bathroom and her son brought her back in.  She was also complaining of increased shortness of breath.  Review of recent admission and previous x-ray done 2 days ago shows bilateral right greater than left pleural effusions.  She does not have any increased peripheral edema.  She does not have oxygen at home.   Shortness of Breath      Home Medications Prior to Admission medications   Medication Sig Start Date End Date Taking? Authorizing Provider  pantoprazole  (PROTONIX ) 40 MG tablet Take 40 mg by mouth 2 (two) times daily. 01/24/24  Yes [provider]  sucralfate  (CARAFATE ) 1 g tablet Take 1 g by mouth 2 (two) times daily.   Yes [provider]  acetaminophen  (TYLENOL ) 650 MG CR tablet Take 650-1,300 mg by mouth every 8 (eight) hours as needed for pain.    [provider]  amLODipine  (NORVASC ) 2.5 MG tablet Take 3 tablets (7.5 mg total) by mouth 2 (two) times daily. 05/03/24   Vann, Jessica U, DO  empagliflozin (JARDIANCE) 10 MG TABS tablet Take 10 mg by mouth daily. Patient not taking: Reported on 05/02/2024    [provider]  escitalopram  (LEXAPRO ) 5 MG tablet Take 5 mg by mouth daily.    [provider]  folic  acid (FOLVITE ) 1 MG tablet Take 1 mg by mouth daily. 05/01/24   [provider]  furosemide  (LASIX ) 20 MG tablet Take 1 tablet (20 mg total) by mouth daily. 04/26/24   Jann Melody, MD  levothyroxine  (SYNTHROID ) 75 MCG tablet Take 75 mcg by mouth every morning. 07/15/21   [provider]  memantine  (NAMENDA ) 10 MG tablet TAKE ONE TABLET BY MOUTH TWICE DAILY 03/14/24   Sethi, Pramod S, MD  ondansetron  (ZOFRAN ) 4 MG tablet Take 1 tablet (4 mg total) by mouth every 6 (six) hours as needed for nausea. 05/03/24   Vann, Jessica U, DO  Polyvinyl Alcohol-Povidone PF (REFRESH) 1.4-0.6 % SOLN Place 1 Application into both eyes as needed (Dry eye).    [provider]  potassium chloride  SA (KLOR-CON  M) 20 MEQ tablet Take 20 mEq by mouth 2 (two) times daily. 05/01/24   [provider]  RESTASIS  0.05 % ophthalmic emulsion Place 1 drop into both eyes 2 (two) times daily.    [provider]  rosuvastatin  (CRESTOR ) 5 MG tablet Take 5 mg by mouth daily.    [provider]  spironolactone  (ALDACTONE ) 25 MG tablet Take 1 tablet (25 mg total) by mouth daily. Patient not taking: Reported on 05/02/2024 07/06/23   Jann Melody, MD      Allergies    Tape, Celexa [citalopram hydrobromide], Fluconazole, Marrie Sizer  tea (camellia sinensis), Green tea leaf ext, Hydralazine  hcl, Kenalog  [triamcinolone ], Levaquin [levofloxacin], Losartan potassium-hctz, Magnesium  citrate, Methocarbamol , Nitrofurantoin  macrocrystal, Scopolamine, Sulfa antibiotics, Tea, Tramadol , and Trimethoprim    Review of Systems   Review of Systems  Respiratory:  Positive for shortness of breath.     Physical Exam Updated Vital Signs BP (!) 164/67   Pulse 76   Temp 98 F (36.7 C) (Oral)   Resp 15   SpO2 97%  Physical Exam Vitals and nursing note reviewed.  Constitutional:      General: She is not in acute distress.    Appearance: She is well-developed. She is not diaphoretic.  HENT:      Head: Normocephalic and atraumatic.     Right Ear: External ear normal.     Left Ear: External ear normal.     Nose: Nose normal.     Mouth/Throat:     Mouth: Mucous membranes are moist.  Eyes:     General: No scleral icterus.    Conjunctiva/sclera: Conjunctivae normal.  Cardiovascular:     Rate and Rhythm: Normal rate and regular rhythm.     Heart sounds: Normal heart sounds. No murmur heard.    No friction rub. No gallop.  Pulmonary:     Effort: Pulmonary effort is normal. No respiratory distress.     Breath sounds: Examination of the right-lower field reveals decreased breath sounds. Examination of the left-lower field reveals decreased breath sounds. Decreased breath sounds present.  Abdominal:     General: Bowel sounds are normal. There is no distension.     Palpations: Abdomen is soft. There is no mass.     Tenderness: There is no abdominal tenderness. There is no guarding.  Musculoskeletal:     Cervical back: Normal range of motion.     Right lower leg: Edema present.     Left lower leg: Edema present.  Skin:    General: Skin is warm and dry.  Neurological:     Mental Status: She is alert and oriented to person, place, and time.  Psychiatric:        Behavior: Behavior normal.     ED Results / Procedures / Treatments   Labs (all labs ordered are listed, but only abnormal results are displayed) Labs Reviewed  BASIC METABOLIC PANEL WITH GFR - Abnormal; Notable for the following components:      Result Value   CO2 19 (*)    Glucose, Bld 104 (*)    Creatinine, Ser 1.66 (*)    GFR, Estimated 30 (*)    All other components within normal limits  BRAIN NATRIURETIC PEPTIDE - Abnormal; Notable for the following components:   B Natriuretic Peptide 545.3 (*)    All other components within normal limits  CBC - Abnormal; Notable for the following components:   RBC 3.11 (*)    Hemoglobin 10.1 (*)    HCT 30.6 (*)    RDW 15.9 (*)    Platelets 143 (*)    All other  components within normal limits    EKG EKG Interpretation Date/Time:  Friday May 04 2024 10:46:21 EDT Ventricular Rate:  81 PR Interval:  224 QRS Duration:  98 QT Interval:  376 QTC Calculation: 437 R Axis:   -21  Text Interpretation: Sinus rhythm Prolonged PR interval Borderline left axis deviation Abnormal T, consider ischemia, lateral leads Confirmed by Eve Hinders [phone]) on 05/06/2024 6:19:04 AM  Radiology DG Chest 2 View Result Date: 05/04/2024 CLINICAL DATA:  Shortness of  breath EXAM: CHEST - 2 VIEW COMPARISON:  Chest x-ray performed May 01, 2024 FINDINGS: Enlarged heart with prominence of the central interstitium as well as small bilateral pleural effusions, right greater than left. No pneumothorax. IMPRESSION: 1. Enlarged heart with central congestion. 2. Small bilateral pleural effusions, similar accounting for variation in technique. Electronically Signed   By: Reagan Camera M.D.   On: 05/04/2024 12:41    Procedures .Critical Care  Performed by: Tama Fails, PA-C Authorized by: Tama Fails, PA-C   Critical care provider statement:    Critical care time (minutes):  30   Critical care time was exclusive of:  Separately billable procedures and treating other patients   Critical care was necessary to treat or prevent imminent or life-threatening deterioration of the following conditions:  Respiratory failure (hypoxic respiratory failure)   Critical care was time spent personally by me on the following activities:  Development of treatment plan with patient or surrogate, discussions with consultants, evaluation of patient's response to treatment, examination of patient, ordering and review of laboratory studies, ordering and review of radiographic studies, ordering and performing treatments and interventions, pulse oximetry, re-evaluation of patient's condition, review of old charts and obtaining history from patient or surrogate     Medications Ordered in  ED Medications - No data to display  ED Course/ Medical Decision Making/ A&P Clinical Course as of 05/06/24 0955  Fri May 04, 2024  1117 Patient brought back to the room and she transition from the wheelchair to the bed.  She was placed on pulse ox evaluation immediately and she had to have oxygen saturation of 88% with a good Plath.  Patient placed on 2 L via nasal cannula.  She will need reevaluation. [AH]  1238 Creatinine(!): 1.66 [AH]  1324 This is an 83 yo female with a history congestive heart failure, failure to thrive, presenting from home in the company of her son with concern for generalized weakness and for general checkup.  The patient was found to be hypoxic at home with her finger pulse ox into the low 80s.  At baseline her son reports she has very minimal exercise, typically is laying in bed or in a recliner all day, and is assisted only to the bathroom.  Patient had recently been discharged from the hospital and the patient and her family are trying to prioritize avoiding hospitalization and maximizing time at home, with consideration potentially of future hospice.  On exam the patient is frail appearing but appears comfortable.  At rest she is 95% O2 but with minimal ambulation she drops to 88%.  Labs are relatively stable from recent check this week with some very minor increase in BNP.  Chest x-ray shows continued small to moderate right-sided pleural effusion.  Given the patient and her family's wishes to avoid hospitalization, we have discussed with social worker or case manager assistance with providing home oxygen.  We may build to do this either in the emergency department today or have this shipped to the house early this week.  I do think the patient to be stable for discharge home with her family, as they are able to monitor her oxygen and breathing at home comfortably. [MT]    Clinical Course User Index [AH] Oluwasemilore Pascuzzi, PA-C [MT] Gordon Latus, Janalyn Me, MD                                  Medical Decision Making  This patient presents to the ED for concern of sob and hypoxia, this involves an extensive number of treatment options, and is a complaint that carries with it a high risk of complications and morbidity.  The emergent differential diagnosis for shortness of breath includes, but is not limited to, Pulmonary edema, bronchoconstriction, Pneumonia, Pulmonary embolism, Pneumotherax/ Hemothorax, Dysrythmia, ACS.     Co morbidities:       has a past medical history of Abnormal Pap smear of vagina, Allergic sinusitis, Anxiety, Atherosclerotic peripheral vascular disease (HCC), Carotid artery stenosis, Cellulitis, COPD with asthma (HCC), Depression, Diabetes mellitus type 2, uncomplicated (HCC), DJD (degenerative joint disease), Elevated cholesterol, Elevated MCV, GERD (gastroesophageal reflux disease), Gout, Hearing difficulty, History of recurrent UTIs, Hypertension, Hypothyroidism, IBS (irritable bowel syndrome), Insomnia, Labial fusion, Osteoarthritis of left knee, Osteopenia, Recurrent vaginitis, Sjogren's syndrome (HCC), Syncope (10/12/2013), Tendonitis, Thyroid  disease, Transfusion history, and Vitamin D  deficiency (05/13/2021).   Social Determinants of Health:       SDOH Screenings Food Insecurity: No Food Insecurity (05/01/2024) Housing: Low Risk  (05/01/2024) Transportation Needs: No Transportation Needs (05/01/2024) Utilities: Not At Risk (05/01/2024) Alcohol Screen: Low Risk  (07/15/2023) Financial Resource Strain: Low Risk  (07/15/2023) Physical Activity: Inactive (07/15/2023) Social Connections: Patient Unable To Answer (05/02/2024) Tobacco Use: Medium Risk (05/01/2024)   Additional history:  {Additional history obtained from Son at bedside Records  reviewed including previous imaging and discharge summaries  Lab Tests:  I Ordered, and personally interpreted labs.  The pertinent results include:    Hgb at baseline, 10.1 BNP 543  Mild renal  insufficency  Imaging Studies:  I ordered imaging studies including cxr I independently visualized and interpreted imaging which showed BL pelural effusions, unchanged I agree with the radiologist interpretation      Test Considered:         Critical Interventions:       Oxygen supplementation  Consultations Obtained: TOC consultation for Bedside 02   Problem List / ED Course:      Pleural effusion  (primary encounter diagnosis)  Acute respiratory failure with hypoxia (HCC)     MDM: Patient here for sob and hypoxia. Discharged yesterday from hospital and family is actively seeking to keep this patient at home if possible. Patient has 24/7 in home care and is not very active at baseline. Given her family's shared wishes to keep her at home after yesterday's discharge, it is reasonable to send her home with oxygen. Patient will need pcp follow up. She has BL pleural effusions that are likely the cause.  Patient safe for discharge on home 02.       Amount and/or Complexity of Data Reviewed Labs: ordered. Decision-making details documented in ED Course. Radiology: ordered and independent interpretation performed.  Risk Risk Details: .SATURATION QUALIFICATIONS: (This note is used to comply with regulatory documentation for home oxygen)  Patient Saturations on Room Air at Rest = 87%  Patient Saturations on Room Air while Ambulating = 84%  Patient Saturations on 2 Liters of oxygen while Ambulating = 93%  Please briefly explain why patient needs home oxygen: Hypoxia at rest and ambulation without supplemental oxygen.             Final Clinical Impression(s) / ED Diagnoses Final diagnoses:  Pleural effusion  Acute respiratory failure with hypoxia [facility])    Rx / DC Orders ED Discharge Orders     None         Tama Fails, PA-C 05/06/24 1610    Jerald Molly  J, MD 05/06/24 1020

## 2024-05-04 NOTE — ED Notes (Addendum)
.  SATURATION QUALIFICATIONS: (This note is used to comply with regulatory documentation for home oxygen)  Patient Saturations on Room Air at Rest = 87%  Patient Saturations on Room Air while Ambulating = 84%  Patient Saturations on 2 Liters of oxygen while Ambulating = 93%  Please briefly explain why patient needs home oxygen: Hypoxia at rest and ambulation without supplemental oxygen.

## 2024-05-04 NOTE — Discharge Instructions (Addendum)
 Contact a health care provider if: The amount of time that you are able to do mild exercise decreases or does not improve with time. You have a fever. Get help right away if: You are short of breath. You develop chest pain. You develop a new cough. These symptoms may be an emergency. Get help right away. Call 911. Do not wait to see if the symptoms will go away. Do not drive yourself to the hospital.

## 2024-05-05 DIAGNOSIS — G4700 Insomnia, unspecified: Secondary | ICD-10-CM | POA: Diagnosis not present

## 2024-05-05 DIAGNOSIS — M779 Enthesopathy, unspecified: Secondary | ICD-10-CM | POA: Diagnosis not present

## 2024-05-05 DIAGNOSIS — M3500 Sicca syndrome, unspecified: Secondary | ICD-10-CM | POA: Diagnosis not present

## 2024-05-05 DIAGNOSIS — R1313 Dysphagia, pharyngeal phase: Secondary | ICD-10-CM | POA: Diagnosis not present

## 2024-05-05 DIAGNOSIS — E1151 Type 2 diabetes mellitus with diabetic peripheral angiopathy without gangrene: Secondary | ICD-10-CM | POA: Diagnosis not present

## 2024-05-05 DIAGNOSIS — M800AXD Age-related osteoporosis with current pathological fracture, other site, subsequent encounter for fracture with routine healing: Secondary | ICD-10-CM | POA: Diagnosis not present

## 2024-05-05 DIAGNOSIS — H9193 Unspecified hearing loss, bilateral: Secondary | ICD-10-CM | POA: Diagnosis not present

## 2024-05-05 DIAGNOSIS — J939 Pneumothorax, unspecified: Secondary | ICD-10-CM | POA: Diagnosis not present

## 2024-05-05 DIAGNOSIS — N183 Chronic kidney disease, stage 3 unspecified: Secondary | ICD-10-CM | POA: Diagnosis not present

## 2024-05-05 DIAGNOSIS — E1159 Type 2 diabetes mellitus with other circulatory complications: Secondary | ICD-10-CM | POA: Diagnosis not present

## 2024-05-05 DIAGNOSIS — I152 Hypertension secondary to endocrine disorders: Secondary | ICD-10-CM | POA: Diagnosis not present

## 2024-05-05 DIAGNOSIS — E039 Hypothyroidism, unspecified: Secondary | ICD-10-CM | POA: Diagnosis not present

## 2024-05-05 DIAGNOSIS — I471 Supraventricular tachycardia, unspecified: Secondary | ICD-10-CM | POA: Diagnosis not present

## 2024-05-05 DIAGNOSIS — K589 Irritable bowel syndrome without diarrhea: Secondary | ICD-10-CM | POA: Diagnosis not present

## 2024-05-05 DIAGNOSIS — K219 Gastro-esophageal reflux disease without esophagitis: Secondary | ICD-10-CM | POA: Diagnosis not present

## 2024-05-05 DIAGNOSIS — E1169 Type 2 diabetes mellitus with other specified complication: Secondary | ICD-10-CM | POA: Diagnosis not present

## 2024-05-05 DIAGNOSIS — M170 Bilateral primary osteoarthritis of knee: Secondary | ICD-10-CM | POA: Diagnosis not present

## 2024-05-05 DIAGNOSIS — I700 Atherosclerosis of aorta: Secondary | ICD-10-CM | POA: Diagnosis not present

## 2024-05-05 DIAGNOSIS — E785 Hyperlipidemia, unspecified: Secondary | ICD-10-CM | POA: Diagnosis not present

## 2024-05-05 DIAGNOSIS — J449 Chronic obstructive pulmonary disease, unspecified: Secondary | ICD-10-CM | POA: Diagnosis not present

## 2024-05-05 DIAGNOSIS — E1122 Type 2 diabetes mellitus with diabetic chronic kidney disease: Secondary | ICD-10-CM | POA: Diagnosis not present

## 2024-05-10 ENCOUNTER — Telehealth: Payer: Self-pay | Admitting: Neurology

## 2024-05-10 DIAGNOSIS — E1122 Type 2 diabetes mellitus with diabetic chronic kidney disease: Secondary | ICD-10-CM | POA: Diagnosis not present

## 2024-05-10 DIAGNOSIS — I152 Hypertension secondary to endocrine disorders: Secondary | ICD-10-CM | POA: Diagnosis not present

## 2024-05-10 DIAGNOSIS — G4700 Insomnia, unspecified: Secondary | ICD-10-CM | POA: Diagnosis not present

## 2024-05-10 DIAGNOSIS — M3500 Sicca syndrome, unspecified: Secondary | ICD-10-CM | POA: Diagnosis not present

## 2024-05-10 DIAGNOSIS — M170 Bilateral primary osteoarthritis of knee: Secondary | ICD-10-CM | POA: Diagnosis not present

## 2024-05-10 DIAGNOSIS — J939 Pneumothorax, unspecified: Secondary | ICD-10-CM | POA: Diagnosis not present

## 2024-05-10 DIAGNOSIS — M779 Enthesopathy, unspecified: Secondary | ICD-10-CM | POA: Diagnosis not present

## 2024-05-10 DIAGNOSIS — E1169 Type 2 diabetes mellitus with other specified complication: Secondary | ICD-10-CM | POA: Diagnosis not present

## 2024-05-10 DIAGNOSIS — N183 Chronic kidney disease, stage 3 unspecified: Secondary | ICD-10-CM | POA: Diagnosis not present

## 2024-05-10 DIAGNOSIS — H9193 Unspecified hearing loss, bilateral: Secondary | ICD-10-CM | POA: Diagnosis not present

## 2024-05-10 DIAGNOSIS — M800AXD Age-related osteoporosis with current pathological fracture, other site, subsequent encounter for fracture with routine healing: Secondary | ICD-10-CM | POA: Diagnosis not present

## 2024-05-10 DIAGNOSIS — E039 Hypothyroidism, unspecified: Secondary | ICD-10-CM | POA: Diagnosis not present

## 2024-05-10 DIAGNOSIS — I700 Atherosclerosis of aorta: Secondary | ICD-10-CM | POA: Diagnosis not present

## 2024-05-10 DIAGNOSIS — E1151 Type 2 diabetes mellitus with diabetic peripheral angiopathy without gangrene: Secondary | ICD-10-CM | POA: Diagnosis not present

## 2024-05-10 DIAGNOSIS — K589 Irritable bowel syndrome without diarrhea: Secondary | ICD-10-CM | POA: Diagnosis not present

## 2024-05-10 DIAGNOSIS — J449 Chronic obstructive pulmonary disease, unspecified: Secondary | ICD-10-CM | POA: Diagnosis not present

## 2024-05-10 DIAGNOSIS — R1313 Dysphagia, pharyngeal phase: Secondary | ICD-10-CM | POA: Diagnosis not present

## 2024-05-10 DIAGNOSIS — E785 Hyperlipidemia, unspecified: Secondary | ICD-10-CM | POA: Diagnosis not present

## 2024-05-10 DIAGNOSIS — E1159 Type 2 diabetes mellitus with other circulatory complications: Secondary | ICD-10-CM | POA: Diagnosis not present

## 2024-05-10 DIAGNOSIS — K219 Gastro-esophageal reflux disease without esophagitis: Secondary | ICD-10-CM | POA: Diagnosis not present

## 2024-05-10 DIAGNOSIS — I471 Supraventricular tachycardia, unspecified: Secondary | ICD-10-CM | POA: Diagnosis not present

## 2024-05-10 NOTE — Telephone Encounter (Signed)
 [facility] Called to verify pt address and phone no.

## 2024-05-16 DIAGNOSIS — R269 Unspecified abnormalities of gait and mobility: Secondary | ICD-10-CM | POA: Diagnosis not present

## 2024-05-16 DIAGNOSIS — I5033 Acute on chronic diastolic (congestive) heart failure: Secondary | ICD-10-CM | POA: Diagnosis not present

## 2024-05-16 DIAGNOSIS — N184 Chronic kidney disease, stage 4 (severe): Secondary | ICD-10-CM | POA: Diagnosis not present

## 2024-05-18 DIAGNOSIS — M779 Enthesopathy, unspecified: Secondary | ICD-10-CM | POA: Diagnosis not present

## 2024-05-18 DIAGNOSIS — M3500 Sicca syndrome, unspecified: Secondary | ICD-10-CM | POA: Diagnosis not present

## 2024-05-18 DIAGNOSIS — J449 Chronic obstructive pulmonary disease, unspecified: Secondary | ICD-10-CM | POA: Diagnosis not present

## 2024-05-18 DIAGNOSIS — G4700 Insomnia, unspecified: Secondary | ICD-10-CM | POA: Diagnosis not present

## 2024-05-18 DIAGNOSIS — E1169 Type 2 diabetes mellitus with other specified complication: Secondary | ICD-10-CM | POA: Diagnosis not present

## 2024-05-18 DIAGNOSIS — E1159 Type 2 diabetes mellitus with other circulatory complications: Secondary | ICD-10-CM | POA: Diagnosis not present

## 2024-05-18 DIAGNOSIS — K589 Irritable bowel syndrome without diarrhea: Secondary | ICD-10-CM | POA: Diagnosis not present

## 2024-05-18 DIAGNOSIS — H9193 Unspecified hearing loss, bilateral: Secondary | ICD-10-CM | POA: Diagnosis not present

## 2024-05-18 DIAGNOSIS — M800AXD Age-related osteoporosis with current pathological fracture, other site, subsequent encounter for fracture with routine healing: Secondary | ICD-10-CM | POA: Diagnosis not present

## 2024-05-18 DIAGNOSIS — J939 Pneumothorax, unspecified: Secondary | ICD-10-CM | POA: Diagnosis not present

## 2024-05-18 DIAGNOSIS — M170 Bilateral primary osteoarthritis of knee: Secondary | ICD-10-CM | POA: Diagnosis not present

## 2024-05-18 DIAGNOSIS — I700 Atherosclerosis of aorta: Secondary | ICD-10-CM | POA: Diagnosis not present

## 2024-05-18 DIAGNOSIS — E039 Hypothyroidism, unspecified: Secondary | ICD-10-CM | POA: Diagnosis not present

## 2024-05-18 DIAGNOSIS — I471 Supraventricular tachycardia, unspecified: Secondary | ICD-10-CM | POA: Diagnosis not present

## 2024-05-18 DIAGNOSIS — N183 Chronic kidney disease, stage 3 unspecified: Secondary | ICD-10-CM | POA: Diagnosis not present

## 2024-05-18 DIAGNOSIS — R1313 Dysphagia, pharyngeal phase: Secondary | ICD-10-CM | POA: Diagnosis not present

## 2024-05-18 DIAGNOSIS — E1151 Type 2 diabetes mellitus with diabetic peripheral angiopathy without gangrene: Secondary | ICD-10-CM | POA: Diagnosis not present

## 2024-05-18 DIAGNOSIS — K219 Gastro-esophageal reflux disease without esophagitis: Secondary | ICD-10-CM | POA: Diagnosis not present

## 2024-05-18 DIAGNOSIS — E785 Hyperlipidemia, unspecified: Secondary | ICD-10-CM | POA: Diagnosis not present

## 2024-05-18 DIAGNOSIS — I152 Hypertension secondary to endocrine disorders: Secondary | ICD-10-CM | POA: Diagnosis not present

## 2024-05-18 DIAGNOSIS — E1122 Type 2 diabetes mellitus with diabetic chronic kidney disease: Secondary | ICD-10-CM | POA: Diagnosis not present

## 2024-05-22 DIAGNOSIS — M779 Enthesopathy, unspecified: Secondary | ICD-10-CM | POA: Diagnosis not present

## 2024-05-22 DIAGNOSIS — E1169 Type 2 diabetes mellitus with other specified complication: Secondary | ICD-10-CM | POA: Diagnosis not present

## 2024-05-22 DIAGNOSIS — J449 Chronic obstructive pulmonary disease, unspecified: Secondary | ICD-10-CM | POA: Diagnosis not present

## 2024-05-22 DIAGNOSIS — M800AXD Age-related osteoporosis with current pathological fracture, other site, subsequent encounter for fracture with routine healing: Secondary | ICD-10-CM | POA: Diagnosis not present

## 2024-05-22 DIAGNOSIS — N183 Chronic kidney disease, stage 3 unspecified: Secondary | ICD-10-CM | POA: Diagnosis not present

## 2024-05-22 DIAGNOSIS — E785 Hyperlipidemia, unspecified: Secondary | ICD-10-CM | POA: Diagnosis not present

## 2024-05-22 DIAGNOSIS — I700 Atherosclerosis of aorta: Secondary | ICD-10-CM | POA: Diagnosis not present

## 2024-05-22 DIAGNOSIS — E1122 Type 2 diabetes mellitus with diabetic chronic kidney disease: Secondary | ICD-10-CM | POA: Diagnosis not present

## 2024-05-22 DIAGNOSIS — H9193 Unspecified hearing loss, bilateral: Secondary | ICD-10-CM | POA: Diagnosis not present

## 2024-05-22 DIAGNOSIS — E1159 Type 2 diabetes mellitus with other circulatory complications: Secondary | ICD-10-CM | POA: Diagnosis not present

## 2024-05-22 DIAGNOSIS — E1151 Type 2 diabetes mellitus with diabetic peripheral angiopathy without gangrene: Secondary | ICD-10-CM | POA: Diagnosis not present

## 2024-05-22 DIAGNOSIS — J939 Pneumothorax, unspecified: Secondary | ICD-10-CM | POA: Diagnosis not present

## 2024-05-22 DIAGNOSIS — G4700 Insomnia, unspecified: Secondary | ICD-10-CM | POA: Diagnosis not present

## 2024-05-22 DIAGNOSIS — M170 Bilateral primary osteoarthritis of knee: Secondary | ICD-10-CM | POA: Diagnosis not present

## 2024-05-22 DIAGNOSIS — K219 Gastro-esophageal reflux disease without esophagitis: Secondary | ICD-10-CM | POA: Diagnosis not present

## 2024-05-22 DIAGNOSIS — I152 Hypertension secondary to endocrine disorders: Secondary | ICD-10-CM | POA: Diagnosis not present

## 2024-05-22 DIAGNOSIS — M3500 Sicca syndrome, unspecified: Secondary | ICD-10-CM | POA: Diagnosis not present

## 2024-05-22 DIAGNOSIS — E039 Hypothyroidism, unspecified: Secondary | ICD-10-CM | POA: Diagnosis not present

## 2024-05-22 DIAGNOSIS — R1313 Dysphagia, pharyngeal phase: Secondary | ICD-10-CM | POA: Diagnosis not present

## 2024-05-22 DIAGNOSIS — I471 Supraventricular tachycardia, unspecified: Secondary | ICD-10-CM | POA: Diagnosis not present

## 2024-05-22 DIAGNOSIS — K589 Irritable bowel syndrome without diarrhea: Secondary | ICD-10-CM | POA: Diagnosis not present

## 2024-05-30 ENCOUNTER — Telehealth: Payer: Self-pay | Admitting: Neurology

## 2024-05-30 DIAGNOSIS — R7989 Other specified abnormal findings of blood chemistry: Secondary | ICD-10-CM

## 2024-05-30 NOTE — Telephone Encounter (Signed)
 Pt daughter called in regards to medication  and wanted to know which medication should pt stop . Because  a delivery for medication was sent to home  and daughter was concern . Also If medication is to be continued Pt daughter was told there was a coupon  to help pay for mothers medication and would like to know more  about it .

## 2024-05-30 NOTE — Telephone Encounter (Signed)
 I discussed with Dr Rosemarie and then called the patient's daughter back. We discussed that Dr Rosemarie would really like the patient to try the Cerefolin NAC for 3 months. If her homocysteine decreases that does prove need. 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 Patient to stop the folic acid .  Unfortunately it is pricey but the other similar meds are also brands and are expensive as well. If pt unable to afford or refuses to take the Cerefolin NAC then we can double the Folic acid  dose. Daughter understood and said the pharmacy delivered a 3 month supply for about $150. They will try that. She would like the homocysteine level to be rechecked at PCP office as pt goes there frequently for other labs. Dr Rosemarie is ok with this as long as we get the results. Patient's daughter was very appreciative and her questions were answered.  I called Nella, nurse with Dr Dwight and LVM asking explaining request for patient to have Homocysteine level drawn there at end of September. Left office number for her to call back.

## 2024-05-31 DIAGNOSIS — M779 Enthesopathy, unspecified: Secondary | ICD-10-CM | POA: Diagnosis not present

## 2024-05-31 DIAGNOSIS — J449 Chronic obstructive pulmonary disease, unspecified: Secondary | ICD-10-CM | POA: Diagnosis not present

## 2024-05-31 DIAGNOSIS — H9193 Unspecified hearing loss, bilateral: Secondary | ICD-10-CM | POA: Diagnosis not present

## 2024-05-31 DIAGNOSIS — E1122 Type 2 diabetes mellitus with diabetic chronic kidney disease: Secondary | ICD-10-CM | POA: Diagnosis not present

## 2024-05-31 DIAGNOSIS — E1159 Type 2 diabetes mellitus with other circulatory complications: Secondary | ICD-10-CM | POA: Diagnosis not present

## 2024-05-31 DIAGNOSIS — G4700 Insomnia, unspecified: Secondary | ICD-10-CM | POA: Diagnosis not present

## 2024-05-31 DIAGNOSIS — N183 Chronic kidney disease, stage 3 unspecified: Secondary | ICD-10-CM | POA: Diagnosis not present

## 2024-05-31 DIAGNOSIS — M170 Bilateral primary osteoarthritis of knee: Secondary | ICD-10-CM | POA: Diagnosis not present

## 2024-05-31 DIAGNOSIS — E1151 Type 2 diabetes mellitus with diabetic peripheral angiopathy without gangrene: Secondary | ICD-10-CM | POA: Diagnosis not present

## 2024-05-31 DIAGNOSIS — K219 Gastro-esophageal reflux disease without esophagitis: Secondary | ICD-10-CM | POA: Diagnosis not present

## 2024-05-31 DIAGNOSIS — M3500 Sicca syndrome, unspecified: Secondary | ICD-10-CM | POA: Diagnosis not present

## 2024-05-31 DIAGNOSIS — I471 Supraventricular tachycardia, unspecified: Secondary | ICD-10-CM | POA: Diagnosis not present

## 2024-05-31 DIAGNOSIS — K589 Irritable bowel syndrome without diarrhea: Secondary | ICD-10-CM | POA: Diagnosis not present

## 2024-05-31 DIAGNOSIS — E785 Hyperlipidemia, unspecified: Secondary | ICD-10-CM | POA: Diagnosis not present

## 2024-05-31 DIAGNOSIS — R1313 Dysphagia, pharyngeal phase: Secondary | ICD-10-CM | POA: Diagnosis not present

## 2024-05-31 DIAGNOSIS — I152 Hypertension secondary to endocrine disorders: Secondary | ICD-10-CM | POA: Diagnosis not present

## 2024-05-31 DIAGNOSIS — M800AXD Age-related osteoporosis with current pathological fracture, other site, subsequent encounter for fracture with routine healing: Secondary | ICD-10-CM | POA: Diagnosis not present

## 2024-05-31 DIAGNOSIS — I700 Atherosclerosis of aorta: Secondary | ICD-10-CM | POA: Diagnosis not present

## 2024-05-31 DIAGNOSIS — J939 Pneumothorax, unspecified: Secondary | ICD-10-CM | POA: Diagnosis not present

## 2024-05-31 DIAGNOSIS — E039 Hypothyroidism, unspecified: Secondary | ICD-10-CM | POA: Diagnosis not present

## 2024-05-31 DIAGNOSIS — E1169 Type 2 diabetes mellitus with other specified complication: Secondary | ICD-10-CM | POA: Diagnosis not present

## 2024-05-31 NOTE — Telephone Encounter (Signed)
 Spoke w/Pt daughter in regards to Pt PCP office stating will not be able to provide homocysteine lab draw. Scheduled Pt for lab draw at [facility] for 09/04/24 at 1:00 PM. Dtr voiced thanks for call and assistance.

## 2024-05-31 NOTE — Telephone Encounter (Signed)
 Nella with [facility] (Dr. Dwight PCP) called regarding requested Homocysteine level to be drawn in Sept - Dr. Dwight stated their office will not be able to draw that lab.

## 2024-05-31 NOTE — Addendum Note (Signed)
 Addended by: Morenike Cuff K on: 05/31/2024 03:29 PM   Modules accepted: Orders

## 2024-06-04 DIAGNOSIS — I5032 Chronic diastolic (congestive) heart failure: Secondary | ICD-10-CM | POA: Diagnosis not present

## 2024-06-04 DIAGNOSIS — N183 Chronic kidney disease, stage 3 unspecified: Secondary | ICD-10-CM | POA: Diagnosis not present

## 2024-06-04 DIAGNOSIS — T148XXA Other injury of unspecified body region, initial encounter: Secondary | ICD-10-CM | POA: Diagnosis not present

## 2024-06-04 DIAGNOSIS — M810 Age-related osteoporosis without current pathological fracture: Secondary | ICD-10-CM | POA: Diagnosis not present

## 2024-06-06 ENCOUNTER — Telehealth: Payer: Self-pay | Admitting: Pharmacist

## 2024-06-06 ENCOUNTER — Encounter (HOSPITAL_BASED_OUTPATIENT_CLINIC_OR_DEPARTMENT_OTHER): Payer: Self-pay | Admitting: Family

## 2024-06-06 ENCOUNTER — Ambulatory Visit (HOSPITAL_BASED_OUTPATIENT_CLINIC_OR_DEPARTMENT_OTHER): Payer: Medicare Other | Admitting: Family

## 2024-06-06 VITALS — BP 137/40 | HR 72 | Ht 59.0 in | Wt 119.2 lb

## 2024-06-06 DIAGNOSIS — Z79899 Other long term (current) drug therapy: Secondary | ICD-10-CM

## 2024-06-06 DIAGNOSIS — I5033 Acute on chronic diastolic (congestive) heart failure: Secondary | ICD-10-CM

## 2024-06-06 DIAGNOSIS — R5381 Other malaise: Secondary | ICD-10-CM | POA: Diagnosis not present

## 2024-06-06 DIAGNOSIS — I10 Essential (primary) hypertension: Secondary | ICD-10-CM | POA: Diagnosis not present

## 2024-06-06 MED ORDER — FUROSCIX 80 MG/10ML ~~LOC~~ CTKT: 10.0000 mL | CARTRIDGE | Freq: Every day | SUBCUTANEOUS | 0 refills | Status: DC

## 2024-06-06 NOTE — Telephone Encounter (Signed)
 Dispensing 2 furoscix  80mg /103ml per Rec[facility] Finder

## 2024-06-06 NOTE — Progress Notes (Signed)
 Advanced Hypertension Clinic Assessment:    Date:  06/11/2024   ID:  Christina Sellers, DOB January 26, 1941, MRN [id number]  PCP:  Dwight Trula SQUIBB, MD  Cardiologist:  Stanly DELENA Leavens, MD  Nephrologist:  Referring MD: Dwight Trula SQUIBB, MD   CC: Hypertension  History of Present Illness:    Christina Sellers is a 83 y.o. female with a hx of hypertension, HFpEF, diabetes, aortic atherosclerosis, PAC, SVT carotid stenosis, CKD, HLD here to follow up in the Advanced Hypertension Clinic.   Prior cardiac testing includes: Sleep study approx 2020 with no sleep apnea. Renal duplex 03/2020 with no significant RAS. Echocardiogram 5//23 LVEF 60 to 65%, gr1DD, RV normal, LA moderately dilated. Carotid duplex 01/2023 right ICA 40-59% stenosed, left ICA 1 to 39% stenosis. CT abdomen 03/2023 with normal adrenal gland. Monitor 05/2023 predominantly normal sinus rhythm average heart rate 69 bpm, 1 4 beat run of NSVT and one 11 beat run of slow SVT with artifact.  Triggered and diary events associated with NSR. Of noite, adrenal insufficiency previously ruled out with normal ACTH.   Seen by Dr. Arnetha 06/30/2023 recommended to establish with Hypertension Clinic for consideration of research trial vs RDN due to multiple prior intolerances and poor control of BP causing diastolic heart failure exacerbations.   Initial Advanced Hypertension Clinic visit 07/15/23. Clonidine  changed from 0.2mg  QHS to 0.1mg  patch due to concern for rebound hypertension. Given information on renal denervation, but was hesitant regarding procedures. Due to snoring, daytime somnolence, sleep study ordered but not completed. She was given 30 day course of Lexapro  5mg  as had previously tolerated due to anxiety and encouraged to follow up with PCP.   At visit 09/22/2023 she was no longer using clonidine  patch, unclear why or when discontinue, however BP at home routinely 130s over 60s.  She was recommended to continue current regimen p.o.  clonidine  0.1 mg twice daily, Lasix  20 mg daily, spironolactone  25 mg daily, amlodipine  5 mg daily.  Echo 01/17/2024 normal LVEF 60 to 65%, no RWMA, grade 1 diastolic dysfunction, RV normal, mildly elevated PASP, moderate TR.  Followed by Dr. Rosemarie of neurology due to progressive memory and cognitive impairment likely due to Alzheimer's dementia and now on Namenda . History of Present Illness Christina Sellers is an 83 year old female with chronic heart failure who presents with worsening leg swelling and shortness of breath. Son present in clinic and daughter present via phone. They are very involved in her care.   Over the past three weeks, she has experienced worsening bilateral leg swelling and shortness of breath. Fluid leakage (weeping) from her legs began on Saturday night,Her family started wrapping her legs on Sunday. PCP adjusted Lasix  to 40mg  BID x 2 weeks, returned to 40mg  daily with repeat edema, now back on 40mg  BID. Previously only on 20mg  daily. Weight up 8 lbs by our clinic scale.  Her weight has increased to 119 pounds, the highest in eight months. Blood work from June 11 showed a BNP level of 447. She has not taken her potassium pill today. Shortness of breath improved with increased Lasix  dosage but not resolved. No chest pain. Does note persistent LE edema but no longer weeping.   Previous antihypertensives: HCTZ-near-syncope CCB-near-syncope (high-dose) Hydralazine -leg swelling and bruising Losartan-hydrochlorothiazide  - night sweats/fatigue Valsartan - unclear why stopped, last documented 2012  Past Medical History:  Diagnosis Date   Abnormal Pap smear of vagina    Allergic sinusitis    Anxiety    Atherosclerotic peripheral  vascular disease (HCC)    Carotid artery stenosis    Cellulitis    COPD with asthma (HCC)    not an issue now   Depression    Diabetes mellitus type 2, uncomplicated (HCC)    DJD (degenerative joint disease)    Elevated cholesterol    Elevated  MCV    secondary to alchol   GERD (gastroesophageal reflux disease)    not an issue now.   Gout    Hearing difficulty    bilateral hearing aids   History of recurrent UTIs    Hypertension    loss weight'off meds now   Hypothyroidism    IBS (irritable bowel syndrome)    Insomnia    Labial fusion    Osteoarthritis of left knee    Osteopenia    Recurrent vaginitis    Sjogren's syndrome (HCC)    Syncope 10/12/2013   secondary to dehydration and possible hypoglycemia   Tendonitis    Thyroid  disease    Transfusion history    age 19 anemia   Vitamin D  deficiency 05/13/2021    Past Surgical History:  Procedure Laterality Date   CATARACT EXTRACTION, BILATERAL Bilateral    CHOLECYSTECTOMY     open   COLONOSCOPY WITH PROPOFOL  N/A 03/23/2016   Procedure: COLONOSCOPY WITH PROPOFOL ;  Surgeon: Gladis MARLA Louder, MD;  Location: [facility];  Service: Endoscopy;  Laterality: N/A;   EYE SURGERY     FOOT SURGERY Left    retained hardware   KNEE SURGERY Left    scope    TONSILLECTOMY     TOTAL HIP ARTHROPLASTY Right 03/30/2021   Procedure: RIGHTTOTAL HIP ARTHROPLASTY ANTERIOR APPROACH;  Surgeon: Liam Lerner, MD;  Location: [facility];  Service: Orthopedics;  Laterality: Right;    Current Medications: Current Meds  Medication Sig   acetaminophen  (TYLENOL ) 650 MG CR tablet Take 650-1,300 mg by mouth every 8 (eight) hours as needed for pain.   amLODipine  (NORVASC ) 2.5 MG tablet Take 3 tablets (7.5 mg total) by mouth 2 (two) times daily.   [Paused] empagliflozin (JARDIANCE) 10 MG TABS tablet Take 10 mg by mouth daily.   escitalopram  (LEXAPRO ) 5 MG tablet Take 5 mg by mouth daily.   folic acid  (FOLVITE ) 1 MG tablet Take 1 mg by mouth daily.   furosemide  (LASIX ) 20 MG tablet Take 1 tablet (20 mg total) by mouth daily.   levothyroxine  (SYNTHROID ) 75 MCG tablet Take 75 mcg by mouth every morning.   memantine  (NAMENDA ) 10 MG tablet TAKE ONE TABLET BY MOUTH TWICE DAILY   ondansetron  (ZOFRAN )  4 MG tablet Take 1 tablet (4 mg total) by mouth every 6 (six) hours as needed for nausea.   pantoprazole  (PROTONIX ) 40 MG tablet Take 40 mg by mouth 2 (two) times daily.   Polyvinyl Alcohol-Povidone PF (REFRESH) 1.4-0.6 % SOLN Place 1 Application into both eyes as needed (Dry eye).   potassium chloride  SA (KLOR-CON  M) 20 MEQ tablet Take 20 mEq by mouth 2 (two) times daily.   RESTASIS  0.05 % ophthalmic emulsion Place 1 drop into both eyes 2 (two) times daily.   rosuvastatin  (CRESTOR ) 5 MG tablet Take 5 mg by mouth daily.   [Paused] spironolactone  (ALDACTONE ) 25 MG tablet Take 1 tablet (25 mg total) by mouth daily.   sucralfate  (CARAFATE ) 1 g tablet Take 1 g by mouth 2 (two) times daily.     Allergies:   Tape, Celexa [citalopram hydrobromide], Fluconazole, Green tea (camellia sinensis), Green tea leaf ext, Hydralazine  hcl,  Kenalog  [triamcinolone ], Levaquin [levofloxacin], Losartan potassium-hctz, Magnesium  citrate, Methocarbamol , Nitrofurantoin  macrocrystal, Scopolamine, Sulfa antibiotics, Tea, Tramadol , and Trimethoprim   Social History   Socioeconomic History   Marital status: Widowed    Spouse name: Not on file   Number of children: Not on file   Years of education: Not on file   Highest education level: Not on file  Occupational History   Not on file  Tobacco Use   Smoking status: Former    Current packs/day: 0.00    Types: Cigarettes    Quit date: 02/21/1982    Years since quitting: 42.3   Smokeless tobacco: Never  Vaping Use   Vaping status: Never Used  Substance and Sexual Activity   Alcohol use: No   Drug use: No   Sexual activity: Never    Birth control/protection: Post-menopausal  Other Topics Concern   Not on file  Social History Narrative   Not on file   Social Drivers of Health   Financial Resource Strain: Low Risk  (07/15/2023)   Overall Financial Resource Strain (CARDIA)    Difficulty of Paying Living Expenses: Not hard at all  Food Insecurity: No Food  Insecurity (05/01/2024)   Hunger Vital Sign    Worried About Running Out of Food in the Last Year: Never true    Ran Out of Food in the Last Year: Never true  Transportation Needs: No Transportation Needs (05/01/2024)   PRAPARE - Administrator, Civil Service (Medical): No    Lack of Transportation (Non-Medical): No  Physical Activity: Inactive (07/15/2023)   Exercise Vital Sign    Days of Exercise per Week: 0 days    Minutes of Exercise per Session: 0 min  Stress: Not on file  Social Connections: Patient Unable To Answer (05/02/2024)   Social Connection and Isolation Panel    Frequency of Communication with Friends and Family: Patient unable to answer    Frequency of Social Gatherings with Friends and Family: Patient unable to answer    Attends Religious Services: Patient unable to answer    Active Member of Clubs or Organizations: Patient unable to answer    Attends Banker Meetings: Patient unable to answer    Marital Status: Patient unable to answer     Family History: The patient's family history includes Breast cancer in her sister; Cancer in her brother and father; Diabetes in her maternal aunt.  ROS:   Please see the history of present illness.     All other systems reviewed and are negative.  EKGs/Labs/Other Studies Reviewed:         Recent Labs: 06/30/2023: NT-Pro BNP 1,397 01/30/2024: TSH 3.081 02/03/2024: ALT 30 05/02/2024: Magnesium  1.7 05/04/2024: B Natriuretic Peptide 545.3; BUN 22; Creatinine, Ser 1.66; Hemoglobin 10.1; Platelets 143; Potassium 3.6; Sodium 135   Recent Lipid Panel    Component Value Date/Time   CHOL 149 08/26/2011 0400   TRIG 195 (H) 08/26/2011 0400   HDL 37 (L) 08/26/2011 0400   CHOLHDL 4.0 08/26/2011 0400   VLDL 39 08/26/2011 0400   LDLCALC 73 08/26/2011 0400   LDLDIRECT 65 07/15/2023 1321    Physical Exam:   VS:  BP (!) 137/40   Pulse 72   Ht 4' 11 (1.499 m)   Wt 119 lb 3.2 oz (54.1 kg)   SpO2 92%   BMI  24.08 kg/m  , BMI Body mass index is 24.08 kg/m. GENERAL:  Well appearing HEENT: Pupils equal round and reactive, fundi not visualized,  oral mucosa unremarkable NECK:  No jugular venous distention, waveform within normal limits, carotid upstroke brisk and symmetric, no bruits, no thyromegaly LYMPHATICS:  No cervical adenopathy LUNGS:  Clear to auscultation bilaterally HEART:  RRR.  PMI not displaced or sustained,S1 and S2 within normal limits, no S3, no S4, no clicks, no rubs, no murmurs ABD:  Flat, positive bowel sounds normal in frequency in pitch, no bruits, no rebound, no guarding, no midline pulsatile mass, no hepatomegaly, no splenomegaly EXT:  2 plus pulses throughout, bilateral LE 2+ edema, no cyanosis no clubbing SKIN:  No rashes no nodules NEURO:  Cranial nerves II through XII grossly intact, motor grossly intact throughout PSYCH:  Cognitively intact, oriented to person place and time. Family assists with history due to cognitive deficits.    ASSESSMENT/PLAN:    HTN - mildly hypertensive in setting of volume overload. Adjust regimen, as below. Her prior intolerance to Losartan/Valsartan is unclear. Could consider Entresto in future if needed for dual HTN/HFpEF benefit.  Previously declined RDN (procedure on hold at this time). Given progression in Alzheimer's low suspicion family would elect to proceed with any procedures. Sleep study ordered previously but not completed. As BP reasonably controlled at home, would defer further testing. With her Alzheimer's, anticipate CPAP would be difficult.  HFpEF / TR - Follows with Dr. Santo. Weight up 8 lbs from approximate dry with with recent LE edema with weeping, increased dyspnea. Some improvement with increase dose Lasix  by PCP but not resolved. High risk for admission with decompensated HFpEF.  Rx Furoscix  (SQ furosemide ) 80mg  daily x 2 days and hold PO Lasix  Will Rx additional Furoscix  to be kept at home and used PRN as  directed by cardiology to hopefully prevent hospitalization After 2 days, return to Lasix  40mg  daily Phone call Monday to asses volume status and reponse to Furoscix  BMET, BNP, CBC today bAdditional GDMT Jardiance 10 mg daily,spironolactone  25 mg daily.  Defer beta-blocker due to baseline bradycardia.  Low sodium diet, fluid restriction <2L, and daily weights encouraged.  Educated to contact our office for weight gain of 2 lbs overnight or 5 lbs in one week.   Physical deconditioning / Medication management - Refer to [facility] nursing for additional home resources. If symptoms continue to progress, consider referral to palliative care at follow up   Anxiety - Continue Lexapro  5mg  daily.   Aortic atherosclerosis / Hx of TIA / HLD, LDL goal <70 -  Continue Rosuvastatin  5mg  daily. Stable with no anginal symptoms. No indication for ischemic evaluation.    Screening for Secondary Hypertension:     07/15/2023   10:55 AM  Causes  Renovascular HTN Screened     - Comments 2021 renal duplex no stenosis  Sleep Apnea Screened     - Comments sleep study approx 2020 no OSA  Thyroid  Disease Screened     - Comments 12/2022 normal TSH  Hyperaldosteronism Not Screened     - Comments low suspicion benefit in testing as already on Spironolactone  for HFpEF  Pheochromocytoma Screened     - Comments 01/2023 CT abd with normal adrenal gland    Relevant Labs/Studies:    Latest Ref Rng & Units 05/04/2024   12:00 PM 05/03/2024    4:52 AM 05/02/2024    5:08 PM  Basic Labs  Sodium 135 - 145 mmol/L 135  138  138   Potassium 3.5 - 5.1 mmol/L 3.6  3.9  3.7   Creatinine 0.44 - 1.00 mg/dL 8.33  8.53  8.44  Latest Ref Rng & Units 01/30/2024   12:39 PM 11/29/2023   12:09 PM  Thyroid    TSH 0.350 - 4.500 uIU/mL 3.081  6.880                    Disposition:    FU with Dr. Arnetha or APP in 2-3 weeks    Medication Adjustments/Labs and Tests Ordered: Current medicines are reviewed at length with the  patient today.  Concerns regarding medicines are outlined above.  Orders Placed This Encounter  Procedures   Basic metabolic panel with GFR   Brain natriuretic peptide   CBC   No orders of the defined types were placed in this encounter.    Signed, Reche GORMAN Finder, NP  06/11/2024 8:59 AM    [facility]

## 2024-06-06 NOTE — Patient Instructions (Addendum)
 Medication Instructions:   Thursday: Furoscix  subcutaneous (NO oral Lasix )  Friday:Furoscix  subcutaneous (NO oral Lasix )  Saturday through Monday: Lasix  40mg  daily (one in bubble pack + one 20mg  tablet)  Tuesday: TBD based on phone call Monday   Lab Work: Your physician recommends that you return for lab work today: BMET, BNP, CBC  Follow-Up: Your next appointment:   2-3 week(s)  Provider:   Stanly DELENA Leavens, MD or Reche GORMAN Finder, NP or Rosaline Bane, NP    Other Instructions We will call [name] Monday morning to reassess symptoms

## 2024-06-11 ENCOUNTER — Telehealth (HOSPITAL_BASED_OUTPATIENT_CLINIC_OR_DEPARTMENT_OTHER): Payer: Self-pay | Admitting: Family

## 2024-06-11 ENCOUNTER — Telehealth: Payer: Self-pay | Admitting: Family

## 2024-06-11 NOTE — Telephone Encounter (Signed)
 Called [name] (Miss Rolin's daughter) to check in after  two doses of Furoscix  (SQ Lasix ) this weekend and Lasix  40mg  PO daily Saturday, Sunday, and Monday.  Left VM requesting call back.  For nursing team when she calls back: How is weight (if checking)? How is LE edema? How is dyspnea?   Jameya Pontiff S Undray Allman, NP

## 2024-06-11 NOTE — Telephone Encounter (Signed)
 Spoke with daughter regarding medications  Will discuss further with [name] W NP

## 2024-06-11 NOTE — Telephone Encounter (Signed)
 Spoke with daughter regarding patient  Swelling is significantly improved, breathing better Unable to get lab work when she was here for appointment  She is very hard to get up and get going, does not want to get out of bed  When asked the daughter if she thinks she is feeling better overall, stated hard to tell with her dementia but clinically appears better.  Had diarrhea about 20 times over the last week, 2 doses of Imodium  Explained to daughter importance of obtaining labs secondary to diuretics and diarrhea  Daughter will try to get her in over next couple of days  Advised will forward to [name] for review

## 2024-06-11 NOTE — Telephone Encounter (Signed)
 As swelling improved, recommend Lasix  20mg  and 40mg  every other day. Would they be comfortable with home health nursing to see if they could do the labs? If so, will place referral. Would still recommend she come for labs if she can but if [facility] can get out sooner might be easier.  Margurette Brener S Aleaya Latona, NP

## 2024-06-11 NOTE — Telephone Encounter (Signed)
 Daughter called back in separate encounter.

## 2024-06-11 NOTE — Telephone Encounter (Signed)
 Pt c/o medication issue:  1. Name of Medication:  Furosemide  (FUROSCIX ) 80 MG/10ML CTKT   2. How are you currently taking this medication (dosage and times per day)? N/A  3. Are you having a reaction (difficulty breathing--STAT)? N/A  4. What is your medication issue? Reena with [employer] is calling stating they need the patients last OV note and labs in order to submit PA for medication. She is requesting this be faxed to [phone]. Please advise.

## 2024-06-11 NOTE — Telephone Encounter (Signed)
 Pt daughter called in stating pt has been unable to get labs drawn due to unable to walk because of swelling. She asked is there any alternatives like having a nurse come out to pt house. Please advise.

## 2024-06-11 NOTE — Telephone Encounter (Signed)
 Faxed office note and labs as requested, confirmation received  Advised Christina Sellers to call back if they do not receive

## 2024-06-12 ENCOUNTER — Other Ambulatory Visit

## 2024-06-12 DIAGNOSIS — I5033 Acute on chronic diastolic (congestive) heart failure: Secondary | ICD-10-CM | POA: Diagnosis not present

## 2024-06-12 NOTE — Telephone Encounter (Signed)
 Spoke with daughter and advised to start Lasix  20 mg daily alternating with 40 mg daily Continue to weigh daily, call office if weight gain of 3 lbs in 24 hour or 5 pounds in a week.  Daughter asked what if it is weekend and has weight gain. Advised ok to take Lasix  40 mg daily x 2 days  Had labs today   Daughter would like to proceed with Home Health nursing   Patient has been scheduled for follow up

## 2024-06-12 NOTE — Telephone Encounter (Signed)
 Agree with recommendations as provided. HH referral placed to [facility].   Jolisa Intriago S Benjamim Harnish, NP

## 2024-06-12 NOTE — Telephone Encounter (Signed)
 Daughter is returning call to either Reche Finder, NP or Erma, Charity fundraiser.

## 2024-06-13 LAB — BASIC METABOLIC PANEL WITH GFR
BUN/Creatinine Ratio: 11 — ABNORMAL LOW (ref 12–28)
BUN: 19 mg/dL (ref 8–27)
CO2: 20 mmol/L (ref 20–29)
Calcium: 8.9 mg/dL (ref 8.7–10.3)
Chloride: 105 mmol/L (ref 96–106)
Creatinine, Ser: 1.77 mg/dL — ABNORMAL HIGH (ref 0.57–1.00)
Glucose: 127 mg/dL — ABNORMAL HIGH (ref 70–99)
Potassium: 3.5 mmol/L (ref 3.5–5.2)
Sodium: 143 mmol/L (ref 134–144)
eGFR: 28 mL/min/1.73 — ABNORMAL LOW (ref >59)

## 2024-06-13 LAB — CBC
Hematocrit: 30.4 % — ABNORMAL LOW (ref 34.0–46.6)
Hemoglobin: 9.6 g/dL — ABNORMAL LOW (ref 11.1–15.9)
MCH: 31 pg (ref 26.6–33.0)
MCHC: 31.6 g/dL (ref 31.5–35.7)
MCV: 98 fL — ABNORMAL HIGH (ref 79–97)
Platelets: 149 x10E3/uL — ABNORMAL LOW (ref 150–450)
RBC: 3.1 x10E6/uL — ABNORMAL LOW (ref 3.77–5.28)
RDW: 14.2 % (ref 11.7–15.4)
WBC: 6 x10E3/uL (ref 3.4–10.8)

## 2024-06-13 LAB — BRAIN NATRIURETIC PEPTIDE: BNP: 417.1 pg/mL — ABNORMAL HIGH (ref 0.0–100.0)

## 2024-06-14 ENCOUNTER — Ambulatory Visit (HOSPITAL_BASED_OUTPATIENT_CLINIC_OR_DEPARTMENT_OTHER): Payer: Self-pay | Admitting: Family

## 2024-06-14 DIAGNOSIS — I6523 Occlusion and stenosis of bilateral carotid arteries: Secondary | ICD-10-CM | POA: Diagnosis not present

## 2024-06-14 DIAGNOSIS — E876 Hypokalemia: Secondary | ICD-10-CM | POA: Diagnosis not present

## 2024-06-14 DIAGNOSIS — I130 Hypertensive heart and chronic kidney disease with heart failure and stage 1 through stage 4 chronic kidney disease, or unspecified chronic kidney disease: Secondary | ICD-10-CM | POA: Diagnosis not present

## 2024-06-14 DIAGNOSIS — Z8673 Personal history of transient ischemic attack (TIA), and cerebral infarction without residual deficits: Secondary | ICD-10-CM | POA: Diagnosis not present

## 2024-06-14 DIAGNOSIS — E039 Hypothyroidism, unspecified: Secondary | ICD-10-CM | POA: Diagnosis not present

## 2024-06-14 DIAGNOSIS — M109 Gout, unspecified: Secondary | ICD-10-CM | POA: Diagnosis not present

## 2024-06-14 DIAGNOSIS — I471 Supraventricular tachycardia, unspecified: Secondary | ICD-10-CM | POA: Diagnosis not present

## 2024-06-14 DIAGNOSIS — J4489 Other specified chronic obstructive pulmonary disease: Secondary | ICD-10-CM | POA: Diagnosis not present

## 2024-06-14 DIAGNOSIS — Z87891 Personal history of nicotine dependence: Secondary | ICD-10-CM | POA: Diagnosis not present

## 2024-06-14 DIAGNOSIS — I5033 Acute on chronic diastolic (congestive) heart failure: Secondary | ICD-10-CM | POA: Diagnosis not present

## 2024-06-14 DIAGNOSIS — E1169 Type 2 diabetes mellitus with other specified complication: Secondary | ICD-10-CM | POA: Diagnosis not present

## 2024-06-14 DIAGNOSIS — N1832 Chronic kidney disease, stage 3b: Secondary | ICD-10-CM | POA: Diagnosis not present

## 2024-06-14 DIAGNOSIS — E785 Hyperlipidemia, unspecified: Secondary | ICD-10-CM | POA: Diagnosis not present

## 2024-06-14 DIAGNOSIS — I700 Atherosclerosis of aorta: Secondary | ICD-10-CM | POA: Diagnosis not present

## 2024-06-14 DIAGNOSIS — E1122 Type 2 diabetes mellitus with diabetic chronic kidney disease: Secondary | ICD-10-CM | POA: Diagnosis not present

## 2024-06-14 DIAGNOSIS — Z556 Problems related to health literacy: Secondary | ICD-10-CM | POA: Diagnosis not present

## 2024-06-14 DIAGNOSIS — G301 Alzheimer's disease with late onset: Secondary | ICD-10-CM | POA: Diagnosis not present

## 2024-06-15 ENCOUNTER — Encounter (HOSPITAL_BASED_OUTPATIENT_CLINIC_OR_DEPARTMENT_OTHER): Payer: Self-pay | Admitting: Family

## 2024-06-15 ENCOUNTER — Telehealth: Payer: Self-pay | Admitting: Family

## 2024-06-15 NOTE — Telephone Encounter (Signed)
 Christina Sellers, verbalized understanding

## 2024-06-15 NOTE — Telephone Encounter (Signed)
 Pt c/o medication issue:  1. Name of Medication:  Lasix  20 MG  2. How are you currently taking this medication (dosage and times per day)?  Alternating 20 MG one day, then 40 MG the following day.   3. Are you having a reaction (difficulty breathing--STAT)?   4. What is your medication issue?  Daughter has concerns with medication changes. Patient lost 8 lbs in 5 days. She would like a call back to discuss.  7/06: 113.8 lbs 7/08: 112.3 lbs 7/09: 110.1 lbs 7/10: 108.5 lbs 7/11: 105.6 lbs

## 2024-06-15 NOTE — Telephone Encounter (Signed)
 She did have significant volume overload based on physical exam and labs. The weight she is losing is the fluid weight.  Based on our scale, she had about 8 pounds of excess weight from fluid on board.    Let's have her return to just Lasix  20mg  once per day.If she has a weight of 108 lbs or higher, she should take an extra Lasix  20mg  that day.   She will take just the Lasix  in her pill pack. If she has a day her weight is 108 lbs or higher, she will need to take an extra Lasix  20mg . She has a separate supply of extra if she needs it.   If she notes her weight continues to down trend or if she is having to take the extra Lasix  for 3 days in a row, please call and let us  know.  Lindie Roberson S Amogh Komatsu, NP

## 2024-06-15 NOTE — Telephone Encounter (Signed)
 Will forward to [name] for review

## 2024-06-18 ENCOUNTER — Other Ambulatory Visit: Payer: Self-pay | Admitting: Neurology

## 2024-06-19 ENCOUNTER — Telehealth: Payer: Self-pay | Admitting: Family

## 2024-06-19 DIAGNOSIS — M109 Gout, unspecified: Secondary | ICD-10-CM | POA: Diagnosis not present

## 2024-06-19 DIAGNOSIS — I471 Supraventricular tachycardia, unspecified: Secondary | ICD-10-CM | POA: Diagnosis not present

## 2024-06-19 DIAGNOSIS — G301 Alzheimer's disease with late onset: Secondary | ICD-10-CM | POA: Diagnosis not present

## 2024-06-19 DIAGNOSIS — E039 Hypothyroidism, unspecified: Secondary | ICD-10-CM | POA: Diagnosis not present

## 2024-06-19 DIAGNOSIS — Z556 Problems related to health literacy: Secondary | ICD-10-CM | POA: Diagnosis not present

## 2024-06-19 DIAGNOSIS — Z8673 Personal history of transient ischemic attack (TIA), and cerebral infarction without residual deficits: Secondary | ICD-10-CM | POA: Diagnosis not present

## 2024-06-19 DIAGNOSIS — I130 Hypertensive heart and chronic kidney disease with heart failure and stage 1 through stage 4 chronic kidney disease, or unspecified chronic kidney disease: Secondary | ICD-10-CM | POA: Diagnosis not present

## 2024-06-19 DIAGNOSIS — E1169 Type 2 diabetes mellitus with other specified complication: Secondary | ICD-10-CM | POA: Diagnosis not present

## 2024-06-19 DIAGNOSIS — E876 Hypokalemia: Secondary | ICD-10-CM | POA: Diagnosis not present

## 2024-06-19 DIAGNOSIS — Z87891 Personal history of nicotine dependence: Secondary | ICD-10-CM | POA: Diagnosis not present

## 2024-06-19 DIAGNOSIS — I5033 Acute on chronic diastolic (congestive) heart failure: Secondary | ICD-10-CM | POA: Diagnosis not present

## 2024-06-19 DIAGNOSIS — I700 Atherosclerosis of aorta: Secondary | ICD-10-CM | POA: Diagnosis not present

## 2024-06-19 DIAGNOSIS — I6523 Occlusion and stenosis of bilateral carotid arteries: Secondary | ICD-10-CM | POA: Diagnosis not present

## 2024-06-19 DIAGNOSIS — E1122 Type 2 diabetes mellitus with diabetic chronic kidney disease: Secondary | ICD-10-CM | POA: Diagnosis not present

## 2024-06-19 DIAGNOSIS — E785 Hyperlipidemia, unspecified: Secondary | ICD-10-CM | POA: Diagnosis not present

## 2024-06-19 DIAGNOSIS — J4489 Other specified chronic obstructive pulmonary disease: Secondary | ICD-10-CM | POA: Diagnosis not present

## 2024-06-19 DIAGNOSIS — N1832 Chronic kidney disease, stage 3b: Secondary | ICD-10-CM | POA: Diagnosis not present

## 2024-06-19 NOTE — Telephone Encounter (Signed)
 Per Reche ORN NP ok to get pro BNP, advised Lauraine

## 2024-06-19 NOTE — Telephone Encounter (Signed)
 Christina Sellers with [facility] called in stating they have order from [facility], NP for pt to have labs done this week. She states one of the order (BNP), she cannot do but she can do pro BNP, she asked if this was okay. She states her VM is confidential so you can leave a VM with information if not answer. Please advise.

## 2024-06-20 ENCOUNTER — Telehealth: Payer: Self-pay | Admitting: Family

## 2024-06-20 NOTE — Telephone Encounter (Signed)
 Discussed with Dr Lonni   Will have patient hold her Lasix  for few days and call back with update Advised daughter, verbalized understanding  Patients weight this am 98 lbs, down 10 pounds since 7/10  HHN coming tomorrow for labs   Will forward to Reche ORN NP for review

## 2024-06-20 NOTE — Telephone Encounter (Addendum)
 Not eating or drinking much  Didn't get out of bed last 2 days until 5 pm  BP 132/76 HR 67 O2-96%  Goes from bed to sofa, doesn't do anything  Lot of sleeping and very weak  Fell on Sunday, didn't hit floor family caught her   Will discuss with DOD since Reche ORN NP out of office

## 2024-06-20 NOTE — Telephone Encounter (Signed)
 Pt's daughter states that they were advised to call office if weight gain. Daughter reports weight loss of 8lb from 7/10-7/14. Ave 2lbs per day Currently on 20mg  daily BP 132/76 HR 67 O2-96%

## 2024-06-21 NOTE — Telephone Encounter (Signed)
 Agree with recommendation to hold Lasix . May use as needed for weight gain of 2lbs overnight or 5 lbs in one week.    Reche Finder NP    Called and tried to leave a message with the daughter but her voicemail box is full and not taking messages.

## 2024-06-21 NOTE — Telephone Encounter (Signed)
 Spoke with the pts daughter [name] (on [place]) and endorsed to her that per Reche Finder NP, the pt can hold lasix  and take this as needed for weight gain of 2 lbs overnight or 5 lbs in one week.   [name] verbalized understanding and agrees with this plan.

## 2024-06-22 DIAGNOSIS — I509 Heart failure, unspecified: Secondary | ICD-10-CM | POA: Diagnosis not present

## 2024-06-25 NOTE — Telephone Encounter (Signed)
 Will forward to Ronn Melena NP for review

## 2024-06-25 NOTE — Telephone Encounter (Signed)
 As she is no longer taking diuretic I cannot explain weight loss. Would recommend follow up with primary care for monitoring of thyroid  function or other causes of weight loss. Some individuals with dementia end up needing a dietary stimulant.   Jemarcus Dougal S Mertha Clyatt, NP

## 2024-06-25 NOTE — Telephone Encounter (Signed)
 Last week it was 100.7 and today it is 96.1, calling with conern with how fast she is losing weight. And still withholding lasix . Please advise

## 2024-06-26 ENCOUNTER — Telehealth: Payer: Self-pay | Admitting: Internal Medicine

## 2024-06-26 DIAGNOSIS — I700 Atherosclerosis of aorta: Secondary | ICD-10-CM | POA: Diagnosis not present

## 2024-06-26 DIAGNOSIS — E039 Hypothyroidism, unspecified: Secondary | ICD-10-CM | POA: Diagnosis not present

## 2024-06-26 DIAGNOSIS — Z8673 Personal history of transient ischemic attack (TIA), and cerebral infarction without residual deficits: Secondary | ICD-10-CM | POA: Diagnosis not present

## 2024-06-26 DIAGNOSIS — I471 Supraventricular tachycardia, unspecified: Secondary | ICD-10-CM | POA: Diagnosis not present

## 2024-06-26 DIAGNOSIS — Z87891 Personal history of nicotine dependence: Secondary | ICD-10-CM | POA: Diagnosis not present

## 2024-06-26 DIAGNOSIS — G301 Alzheimer's disease with late onset: Secondary | ICD-10-CM | POA: Diagnosis not present

## 2024-06-26 DIAGNOSIS — E785 Hyperlipidemia, unspecified: Secondary | ICD-10-CM | POA: Diagnosis not present

## 2024-06-26 DIAGNOSIS — E1122 Type 2 diabetes mellitus with diabetic chronic kidney disease: Secondary | ICD-10-CM | POA: Diagnosis not present

## 2024-06-26 DIAGNOSIS — E876 Hypokalemia: Secondary | ICD-10-CM | POA: Diagnosis not present

## 2024-06-26 DIAGNOSIS — J4489 Other specified chronic obstructive pulmonary disease: Secondary | ICD-10-CM | POA: Diagnosis not present

## 2024-06-26 DIAGNOSIS — I5033 Acute on chronic diastolic (congestive) heart failure: Secondary | ICD-10-CM | POA: Diagnosis not present

## 2024-06-26 DIAGNOSIS — Z556 Problems related to health literacy: Secondary | ICD-10-CM | POA: Diagnosis not present

## 2024-06-26 DIAGNOSIS — I130 Hypertensive heart and chronic kidney disease with heart failure and stage 1 through stage 4 chronic kidney disease, or unspecified chronic kidney disease: Secondary | ICD-10-CM | POA: Diagnosis not present

## 2024-06-26 DIAGNOSIS — M109 Gout, unspecified: Secondary | ICD-10-CM | POA: Diagnosis not present

## 2024-06-26 DIAGNOSIS — E1169 Type 2 diabetes mellitus with other specified complication: Secondary | ICD-10-CM | POA: Diagnosis not present

## 2024-06-26 DIAGNOSIS — N1832 Chronic kidney disease, stage 3b: Secondary | ICD-10-CM | POA: Diagnosis not present

## 2024-06-26 DIAGNOSIS — I6523 Occlusion and stenosis of bilateral carotid arteries: Secondary | ICD-10-CM | POA: Diagnosis not present

## 2024-06-26 NOTE — Telephone Encounter (Signed)
 Caller Teddi) reports patient has been losing weight.  Caller stated patient weighed 94 pounds on Sunday (7/20) and today patient weighted 110 pounds on the weekend of 7/12-13.  Caller stated patient has also been feeling very weak and wants advice on next steps.

## 2024-06-26 NOTE — Telephone Encounter (Signed)
 Santo Stanly LABOR, MD  Fredirick Newell NOVAK, LPN Thanks for reaching out.  I think you are right.  Christina Sellers dementia progressed a lot- it has been a rapid escalation where I knew there was a clear change when she did not remember any of the side effects that kept her from being adherrent to her medications.  Now either her son or one of her daughters are providing most of her case.  I share your concern that this is not her dementia is not cardiac in origin but that she truly might be in the end stages of her neurologic disease.  Is she seeing Palliative Care?  I am also not in this Thursday or Friday, though I am coming in at 4AM to do a structural CT.  If I have any availability next week I'd be happy to see her to close the loop.  Thanks, MAC       Previous Messages    ----- Message ----- From: Fredirick Newell NOVAK, LPN Sent: 2/83/7974   6:53 PM EDT To: Stanly LABOR Santo, MD  Hello Dr Santo,  I have spoken with the daughter Christina Sellers few times regarding Christina Sellers. She seems to really be declining with weight loss, not eating or drinking much, sleeping a lot and weakness. The daughter had mentioned last week not knowing if she was just nearing the end of life. Per daughter her PCP feels should be following up with cards and her problems are HF related. Has f/u with [facility] 7/25, HHN going out to get labs tomorrow. Unsure if her dementia playing a part in all of this, discussed with Dr Lonni and she thought I should let you know. Christina Sellers is out today and tomorrow.  Newell  Advised daughter of recommendations Changed follow up with Reche to Dr Santo Blades aware of date, time, and location

## 2024-06-27 DIAGNOSIS — I130 Hypertensive heart and chronic kidney disease with heart failure and stage 1 through stage 4 chronic kidney disease, or unspecified chronic kidney disease: Secondary | ICD-10-CM | POA: Diagnosis not present

## 2024-06-27 DIAGNOSIS — I700 Atherosclerosis of aorta: Secondary | ICD-10-CM | POA: Diagnosis not present

## 2024-06-27 DIAGNOSIS — E1122 Type 2 diabetes mellitus with diabetic chronic kidney disease: Secondary | ICD-10-CM | POA: Diagnosis not present

## 2024-06-27 DIAGNOSIS — E039 Hypothyroidism, unspecified: Secondary | ICD-10-CM | POA: Diagnosis not present

## 2024-06-27 DIAGNOSIS — M109 Gout, unspecified: Secondary | ICD-10-CM | POA: Diagnosis not present

## 2024-06-27 DIAGNOSIS — I6523 Occlusion and stenosis of bilateral carotid arteries: Secondary | ICD-10-CM | POA: Diagnosis not present

## 2024-06-27 DIAGNOSIS — Z556 Problems related to health literacy: Secondary | ICD-10-CM | POA: Diagnosis not present

## 2024-06-27 DIAGNOSIS — I5033 Acute on chronic diastolic (congestive) heart failure: Secondary | ICD-10-CM | POA: Diagnosis not present

## 2024-06-27 DIAGNOSIS — N1832 Chronic kidney disease, stage 3b: Secondary | ICD-10-CM | POA: Diagnosis not present

## 2024-06-27 DIAGNOSIS — E876 Hypokalemia: Secondary | ICD-10-CM | POA: Diagnosis not present

## 2024-06-27 DIAGNOSIS — J4489 Other specified chronic obstructive pulmonary disease: Secondary | ICD-10-CM | POA: Diagnosis not present

## 2024-06-27 DIAGNOSIS — E785 Hyperlipidemia, unspecified: Secondary | ICD-10-CM | POA: Diagnosis not present

## 2024-06-27 DIAGNOSIS — Z87891 Personal history of nicotine dependence: Secondary | ICD-10-CM | POA: Diagnosis not present

## 2024-06-27 DIAGNOSIS — I471 Supraventricular tachycardia, unspecified: Secondary | ICD-10-CM | POA: Diagnosis not present

## 2024-06-27 DIAGNOSIS — Z8673 Personal history of transient ischemic attack (TIA), and cerebral infarction without residual deficits: Secondary | ICD-10-CM | POA: Diagnosis not present

## 2024-06-27 DIAGNOSIS — E1169 Type 2 diabetes mellitus with other specified complication: Secondary | ICD-10-CM | POA: Diagnosis not present

## 2024-06-27 NOTE — Telephone Encounter (Signed)
 Attempted to return call. No answer, unable to leave a message due to machine being full.

## 2024-06-29 ENCOUNTER — Ambulatory Visit (HOSPITAL_BASED_OUTPATIENT_CLINIC_OR_DEPARTMENT_OTHER): Admitting: Family

## 2024-06-29 ENCOUNTER — Ambulatory Visit: Admitting: Internal Medicine

## 2024-06-29 NOTE — Telephone Encounter (Signed)
 Pt is scheduled to be seen today in the office for further evaluation.

## 2024-07-02 ENCOUNTER — Telehealth: Payer: Self-pay

## 2024-07-02 NOTE — Telephone Encounter (Signed)
 Received lab report BNP [id number] lab drawn 06/30/24 ordered by [facility].   Spoke with pt son Sherida.  Reports pt is receiving Palliative Care at this time.  Pt wears 2L O2 at all times.  Mobility is limited.   Denies leg swelling and SOB.  Reports pt is no longer taking furosemide .  Son can't recall which provider discontinued medication.   Son expresses goal is to keep mother comfortable.  Wants to know if canceled OV needs to be rescheduled.  Advised if the goal of care is comfort then there is no need for an OV.  If pt needs treatment then we can definitely see her in office.  Advised son to contact our office with further questions or concerns.   Discussed with Dr. Santo.

## 2024-07-04 ENCOUNTER — Other Ambulatory Visit: Payer: Self-pay

## 2024-07-04 ENCOUNTER — Telehealth: Payer: Self-pay | Admitting: Internal Medicine

## 2024-07-04 ENCOUNTER — Encounter (HOSPITAL_COMMUNITY): Payer: Self-pay

## 2024-07-04 ENCOUNTER — Emergency Department (HOSPITAL_COMMUNITY)

## 2024-07-04 ENCOUNTER — Emergency Department (HOSPITAL_COMMUNITY)
Admission: EM | Admit: 2024-07-04 | Discharge: 2024-07-04 | Disposition: A | Discharge status: Home / Self Care | Attending: Emergency Medicine | Admitting: Emergency Medicine

## 2024-07-04 DIAGNOSIS — J4489 Other specified chronic obstructive pulmonary disease: Secondary | ICD-10-CM | POA: Diagnosis not present

## 2024-07-04 DIAGNOSIS — N189 Chronic kidney disease, unspecified: Secondary | ICD-10-CM | POA: Insufficient documentation

## 2024-07-04 DIAGNOSIS — I130 Hypertensive heart and chronic kidney disease with heart failure and stage 1 through stage 4 chronic kidney disease, or unspecified chronic kidney disease: Secondary | ICD-10-CM | POA: Diagnosis not present

## 2024-07-04 DIAGNOSIS — F0390 Unspecified dementia without behavioral disturbance: Secondary | ICD-10-CM | POA: Diagnosis not present

## 2024-07-04 DIAGNOSIS — E876 Hypokalemia: Secondary | ICD-10-CM | POA: Diagnosis not present

## 2024-07-04 DIAGNOSIS — N390 Urinary tract infection, site not specified: Secondary | ICD-10-CM | POA: Insufficient documentation

## 2024-07-04 DIAGNOSIS — Z7984 Long term (current) use of oral hypoglycemic drugs: Secondary | ICD-10-CM | POA: Insufficient documentation

## 2024-07-04 DIAGNOSIS — E039 Hypothyroidism, unspecified: Secondary | ICD-10-CM | POA: Diagnosis not present

## 2024-07-04 DIAGNOSIS — R519 Headache, unspecified: Secondary | ICD-10-CM | POA: Diagnosis not present

## 2024-07-04 DIAGNOSIS — E785 Hyperlipidemia, unspecified: Secondary | ICD-10-CM | POA: Diagnosis not present

## 2024-07-04 DIAGNOSIS — E119 Type 2 diabetes mellitus without complications: Secondary | ICD-10-CM | POA: Diagnosis not present

## 2024-07-04 DIAGNOSIS — E1169 Type 2 diabetes mellitus with other specified complication: Secondary | ICD-10-CM | POA: Diagnosis not present

## 2024-07-04 DIAGNOSIS — Z79899 Other long term (current) drug therapy: Secondary | ICD-10-CM | POA: Insufficient documentation

## 2024-07-04 DIAGNOSIS — G301 Alzheimer's disease with late onset: Secondary | ICD-10-CM | POA: Diagnosis not present

## 2024-07-04 DIAGNOSIS — R6889 Other general symptoms and signs: Secondary | ICD-10-CM | POA: Diagnosis not present

## 2024-07-04 DIAGNOSIS — Z743 Need for continuous supervision: Secondary | ICD-10-CM | POA: Diagnosis not present

## 2024-07-04 DIAGNOSIS — I10 Essential (primary) hypertension: Secondary | ICD-10-CM

## 2024-07-04 DIAGNOSIS — I509 Heart failure, unspecified: Secondary | ICD-10-CM | POA: Insufficient documentation

## 2024-07-04 DIAGNOSIS — I6782 Cerebral ischemia: Secondary | ICD-10-CM | POA: Diagnosis not present

## 2024-07-04 DIAGNOSIS — G4489 Other headache syndrome: Secondary | ICD-10-CM | POA: Diagnosis not present

## 2024-07-04 HISTORY — DX: Alzheimer's disease with late onset: G30.1

## 2024-07-04 LAB — CBC WITH DIFFERENTIAL/PLATELET
Abs Immature Granulocytes: 0.03 K/uL (ref 0.00–0.07)
Basophils Absolute: 0.1 K/uL (ref 0.0–0.1)
Basophils Relative: 1 %
Eosinophils Absolute: 0.1 K/uL (ref 0.0–0.5)
Eosinophils Relative: 2 %
HCT: 34.1 % — ABNORMAL LOW (ref 36.0–46.0)
Hemoglobin: 11.1 g/dL — ABNORMAL LOW (ref 12.0–15.0)
Immature Granulocytes: 0 %
Lymphocytes Relative: 17 %
Lymphs Abs: 1.2 K/uL (ref 0.7–4.0)
MCH: 32.3 pg (ref 26.0–34.0)
MCHC: 32.6 g/dL (ref 30.0–36.0)
MCV: 99.1 fL (ref 80.0–100.0)
Monocytes Absolute: 0.8 K/uL (ref 0.1–1.0)
Monocytes Relative: 11 %
Neutro Abs: 5.2 K/uL (ref 1.7–7.7)
Neutrophils Relative %: 69 %
Platelets: 151 K/uL (ref 150–400)
RBC: 3.44 MIL/uL — ABNORMAL LOW (ref 3.87–5.11)
RDW: 15.2 % (ref 11.5–15.5)
WBC: 7.4 K/uL (ref 4.0–10.5)
nRBC: 0 % (ref 0.0–0.2)

## 2024-07-04 LAB — URINALYSIS, W/ REFLEX TO CULTURE (INFECTION SUSPECTED)
Bacteria, UA: NONE SEEN
Bilirubin Urine: NEGATIVE
Glucose, UA: >=500 mg/dL — AB
Hgb urine dipstick: NEGATIVE
Ketones, ur: NEGATIVE mg/dL
Nitrite: NEGATIVE
Protein, ur: >=300 mg/dL — AB
Specific Gravity, Urine: 1.018 (ref 1.005–1.030)
pH: 6 (ref 5.0–8.0)

## 2024-07-04 LAB — COMPREHENSIVE METABOLIC PANEL WITH GFR
ALT: 17 U/L (ref 0–44)
AST: 30 U/L (ref 15–41)
Albumin: 3.3 g/dL — ABNORMAL LOW (ref 3.5–5.0)
Alkaline Phosphatase: 71 U/L (ref 38–126)
Anion gap: 11 (ref 5–15)
BUN: 39 mg/dL — ABNORMAL HIGH (ref 8–23)
CO2: 18 mmol/L — ABNORMAL LOW (ref 22–32)
Calcium: 9.9 mg/dL (ref 8.9–10.3)
Chloride: 112 mmol/L — ABNORMAL HIGH (ref 98–111)
Creatinine, Ser: 1.76 mg/dL — ABNORMAL HIGH (ref 0.44–1.00)
GFR, Estimated: 28 mL/min — ABNORMAL LOW (ref >60)
Glucose, Bld: 140 mg/dL — ABNORMAL HIGH (ref 70–99)
Potassium: 4.1 mmol/L (ref 3.5–5.1)
Sodium: 141 mmol/L (ref 135–145)
Total Bilirubin: 0.5 mg/dL (ref 0.0–1.2)
Total Protein: 6.5 g/dL (ref 6.5–8.1)

## 2024-07-04 MED ORDER — CEPHALEXIN 500 MG PO CAPS
500.0000 mg | ORAL_CAPSULE | Freq: Once | ORAL | Status: AC
  Administered 2024-07-04: 500 mg via ORAL
  Filled 2024-07-04: qty 1

## 2024-07-04 MED ORDER — AMLODIPINE BESYLATE 5 MG PO TABS
5.0000 mg | ORAL_TABLET | Freq: Once | ORAL | Status: AC
  Administered 2024-07-04: 5 mg via ORAL
  Filled 2024-07-04: qty 1

## 2024-07-04 MED ORDER — CLONIDINE HCL 0.1 MG PO TABS
0.1000 mg | ORAL_TABLET | Freq: Once | ORAL | Status: AC
  Administered 2024-07-04: 0.1 mg via ORAL
  Filled 2024-07-04: qty 1

## 2024-07-04 MED ORDER — CEPHALEXIN 500 MG PO CAPS
500.0000 mg | ORAL_CAPSULE | Freq: Two times a day (BID) | ORAL | 0 refills | Status: AC
Start: 2024-07-04 — End: 2024-07-11

## 2024-07-04 MED ORDER — LABETALOL HCL 5 MG/ML IV SOLN
10.0000 mg | Freq: Once | INTRAVENOUS | Status: AC
  Administered 2024-07-04: 10 mg via INTRAVENOUS
  Filled 2024-07-04: qty 4

## 2024-07-04 MED ORDER — CLONIDINE HCL 0.1 MG PO TABS: 0.1000 mg | ORAL_TABLET | Freq: Two times a day (BID) | ORAL | 0 refills | Status: AC

## 2024-07-04 MED ORDER — AMLODIPINE BESYLATE 5 MG PO TABS: 5.0000 mg | ORAL_TABLET | Freq: Every day | ORAL | 0 refills | Status: AC

## 2024-07-04 NOTE — Telephone Encounter (Signed)
 Physical Therapist for pt called in reports BP 209/98 currently.  Advised with a BP this high recommend ED visit if family agrees.   When asked if pt is taking prescribed amlodipine  family member in back ground reports amlodipine  was D/C by PCP.  Pt no longer taking this medication.  Again advised of ED visit BP dangerously high. Family is agreeable to this plan.

## 2024-07-04 NOTE — ED Triage Notes (Signed)
 BIBA from home for hypertension, with a headache. Reportedly was taken off her BP meds a month ago. Pt has hx of dementia, chronic O2 use. 230/98 BP 86 HR 97% on 2 lpm

## 2024-07-04 NOTE — ED Notes (Signed)
 Patient transported to CT

## 2024-07-04 NOTE — ED Provider Notes (Signed)
 [place] EMERGENCY DEPARTMENT AT [facility] Provider Note   CSN: [id number] Arrival date & time: 07/04/24  1801     Patient presents with: Hypertension   Christina Sellers is a 83 y.o. female.  {Add pertinent medical, surgical, social history, OB history to HPI:32947} HPI      83 year old female with a history of COPD on 2 L of oxygen at home, hypercholesterolemia, type 2 diabetes, depression, hypothyroidism, hypertension, Sjogren's syndrome, CHF, CKD, dementia who presents with concern for elevated blood pressures and headache.  Physical therapy was seeing patient today and found her blood pressure was 209/98.  Primary care physician had discontinued her amlodipine .   Past Medical History:  Diagnosis Date   Abnormal Pap smear of vagina    Allergic sinusitis    Anxiety    Atherosclerotic peripheral vascular disease (HCC)    Carotid artery stenosis    Cellulitis    COPD with asthma (HCC)    not an issue now   Depression    Diabetes mellitus type 2, uncomplicated (HCC)    DJD (degenerative joint disease)    Elevated cholesterol    Elevated MCV    secondary to alchol   GERD (gastroesophageal reflux disease)    not an issue now.   Gout    Hearing difficulty    bilateral hearing aids   History of recurrent UTIs    Hypertension    loss weight'off meds now   Hypothyroidism    IBS (irritable bowel syndrome)    Insomnia    Labial fusion    Osteoarthritis of left knee    Osteopenia    Recurrent vaginitis    Sjogren's syndrome (HCC)    Syncope 10/12/2013   secondary to dehydration and possible hypoglycemia   Tendonitis    Thyroid  disease    Transfusion history    age 44 anemia   Vitamin D  deficiency 05/13/2021     Prior to Admission medications   Medication Sig Start Date End Date Taking? Authorizing Provider  acetaminophen  (TYLENOL ) 650 MG CR tablet Take 650-1,300 mg by mouth every 8 (eight) hours as needed for pain.    [provider]   amLODipine  (NORVASC ) 2.5 MG tablet Take 3 tablets (7.5 mg total) by mouth 2 (two) times daily. 05/03/24   Vann, Jessica U, DO  empagliflozin (JARDIANCE) 10 MG TABS tablet Take 10 mg by mouth daily.    [provider]  escitalopram  (LEXAPRO ) 5 MG tablet Take 5 mg by mouth daily.    [provider]  folic acid  (FOLVITE ) 1 MG tablet Take 1 mg by mouth daily. 05/01/24   [provider]  Furosemide  (FUROSCIX ) 80 MG/10ML CTKT Inject 10 mLs into the skin daily. 06/06/24   Vannie Reche RAMAN, NP  furosemide  (LASIX ) 20 MG tablet Take 1 tablet (20 mg total) by mouth daily. 04/26/24   Santo Stanly LABOR, MD  levothyroxine  (SYNTHROID ) 75 MCG tablet Take 75 mcg by mouth every morning. 07/15/21   [provider]  memantine  (NAMENDA ) 10 MG tablet TAKE ONE TABLET BY MOUTH TWICE DAILY 06/18/24   Sethi, Pramod S, MD  ondansetron  (ZOFRAN ) 4 MG tablet Take 1 tablet (4 mg total) by mouth every 6 (six) hours as needed for nausea. 05/03/24   Vann, Jessica U, DO  pantoprazole  (PROTONIX ) 40 MG tablet Take 40 mg by mouth 2 (two) times daily. 01/24/24   [provider]  Polyvinyl Alcohol-Povidone PF (REFRESH) 1.4-0.6 % SOLN Place 1 Application into both eyes as  needed (Dry eye).    [provider]  potassium chloride  SA (KLOR-CON  M) 20 MEQ tablet Take 20 mEq by mouth 2 (two) times daily. 05/01/24   [provider]  RESTASIS  0.05 % ophthalmic emulsion Place 1 drop into both eyes 2 (two) times daily.    [provider]  rosuvastatin  (CRESTOR ) 5 MG tablet Take 5 mg by mouth daily.    [provider]  spironolactone  (ALDACTONE ) 25 MG tablet Take 1 tablet (25 mg total) by mouth daily. 07/06/23   Chandrasekhar, Stanly LABOR, MD  sucralfate  (CARAFATE ) 1 g tablet Take 1 g by mouth 2 (two) times daily.    [provider]    Allergies: Tape, Celexa [citalopram hydrobromide], Fluconazole, Green tea (camellia sinensis), Green tea leaf ext, Hydralazine  hcl,  Kenalog  [triamcinolone ], Levaquin [levofloxacin], Losartan potassium-hctz, Magnesium  citrate, Methocarbamol , Nitrofurantoin  macrocrystal, Scopolamine, Sulfa antibiotics, Tea, Tramadol , and Trimethoprim    Review of Systems  Updated Vital Signs There were no vitals taken for this visit.  Physical Exam  (all labs ordered are listed, but only abnormal results are displayed) Labs Reviewed - No data to display  EKG: None  Radiology: No results found.  {Document cardiac monitor, telemetry assessment procedure when appropriate:32947} Procedures   Medications Ordered in the ED - No data to display    {Click here for ABCD2, HEART and other calculators REFRESH Note before signing:1}                              Medical Decision Making  ***  {Document critical care time when appropriate  Document review of labs and clinical decision tools ie CHADS2VASC2, etc  Document your independent review of radiology images and any outside records  Document your discussion with family members, caretakers and with consultants  Document social determinants of health affecting pt's care  Document your decision making why or why not admission, treatments were needed:32947:::1}   Final diagnoses:  None    ED Discharge Orders     None

## 2024-07-04 NOTE — ED Notes (Addendum)
 Pt has been changed into clean brief and is now on purewick

## 2024-07-04 NOTE — Telephone Encounter (Signed)
 Calling to say that patient BP 209/98. Seeing need to call ambulance for the patient. Please advise

## 2024-07-04 NOTE — ED Notes (Signed)
 This tech attempted to remove pulse ox from finger after RN and pt yelled noooo and swat at this techs hand. Pt son requested it to be removed he was informed his mother hit my hand so it was not comfortable trying again. Her son removed it. Pt son then noticed the lead stickers on pt leg and asked for those to be removed. Pt son was informed that due to how she reacted to removing the pulse ox, this tech doesn't feel comfortable. This tech was able to find another co-worker to remover the sticker from the pt body.

## 2024-07-06 DIAGNOSIS — E1122 Type 2 diabetes mellitus with diabetic chronic kidney disease: Secondary | ICD-10-CM | POA: Diagnosis not present

## 2024-07-06 DIAGNOSIS — I130 Hypertensive heart and chronic kidney disease with heart failure and stage 1 through stage 4 chronic kidney disease, or unspecified chronic kidney disease: Secondary | ICD-10-CM | POA: Diagnosis not present

## 2024-07-06 DIAGNOSIS — I5032 Chronic diastolic (congestive) heart failure: Secondary | ICD-10-CM | POA: Diagnosis not present

## 2024-07-06 DIAGNOSIS — E876 Hypokalemia: Secondary | ICD-10-CM | POA: Diagnosis not present

## 2024-07-06 DIAGNOSIS — E039 Hypothyroidism, unspecified: Secondary | ICD-10-CM | POA: Diagnosis not present

## 2024-07-06 DIAGNOSIS — Z7189 Other specified counseling: Secondary | ICD-10-CM | POA: Diagnosis not present

## 2024-07-06 DIAGNOSIS — J9611 Chronic respiratory failure with hypoxia: Secondary | ICD-10-CM | POA: Diagnosis not present

## 2024-07-06 DIAGNOSIS — I471 Supraventricular tachycardia, unspecified: Secondary | ICD-10-CM | POA: Diagnosis not present

## 2024-07-06 DIAGNOSIS — E785 Hyperlipidemia, unspecified: Secondary | ICD-10-CM | POA: Diagnosis not present

## 2024-07-06 DIAGNOSIS — I700 Atherosclerosis of aorta: Secondary | ICD-10-CM | POA: Diagnosis not present

## 2024-07-06 DIAGNOSIS — Z8673 Personal history of transient ischemic attack (TIA), and cerebral infarction without residual deficits: Secondary | ICD-10-CM | POA: Diagnosis not present

## 2024-07-06 DIAGNOSIS — I5033 Acute on chronic diastolic (congestive) heart failure: Secondary | ICD-10-CM | POA: Diagnosis not present

## 2024-07-06 DIAGNOSIS — Z87891 Personal history of nicotine dependence: Secondary | ICD-10-CM | POA: Diagnosis not present

## 2024-07-06 DIAGNOSIS — E1169 Type 2 diabetes mellitus with other specified complication: Secondary | ICD-10-CM | POA: Diagnosis not present

## 2024-07-06 DIAGNOSIS — I6523 Occlusion and stenosis of bilateral carotid arteries: Secondary | ICD-10-CM | POA: Diagnosis not present

## 2024-07-06 DIAGNOSIS — Z556 Problems related to health literacy: Secondary | ICD-10-CM | POA: Diagnosis not present

## 2024-07-06 DIAGNOSIS — M109 Gout, unspecified: Secondary | ICD-10-CM | POA: Diagnosis not present

## 2024-07-06 DIAGNOSIS — G309 Alzheimer's disease, unspecified: Secondary | ICD-10-CM | POA: Diagnosis not present

## 2024-07-06 DIAGNOSIS — G301 Alzheimer's disease with late onset: Secondary | ICD-10-CM | POA: Diagnosis not present

## 2024-07-06 DIAGNOSIS — J4489 Other specified chronic obstructive pulmonary disease: Secondary | ICD-10-CM | POA: Diagnosis not present

## 2024-07-06 DIAGNOSIS — N1832 Chronic kidney disease, stage 3b: Secondary | ICD-10-CM | POA: Diagnosis not present

## 2024-07-06 LAB — URINE CULTURE

## 2024-09-04 ENCOUNTER — Other Ambulatory Visit

## 2024-09-27 ENCOUNTER — Other Ambulatory Visit (HOSPITAL_COMMUNITY): Payer: Self-pay

## 2024-10-10 NOTE — Telephone Encounter (Signed)
 Dr. Rosemarie- [facility] called. They are saying Cerefolin will no longer be available to be dispensed. Rx has to be for Cerefolin NAC specifically. Current rx on file only Cerefolin. Did you want to send in Cerefolin NAC? I do not see where she had homocysteine levels checked after you placed lab order 05/31/24. Did you want this checked first?  [facility] [place], [place] - [address] Phone: [phone]  Fax: [phone]

## 2024-10-15 MED ORDER — CEREFOLIN NAC 6-90.314-2-600 MG PO TABS: ORAL_TABLET | ORAL | 3 refills | Status: DC

## 2024-10-15 NOTE — Telephone Encounter (Signed)
 Order placed.  Sent to [facility].

## 2024-10-15 NOTE — Addendum Note (Signed)
 Addended by: NEYSA NENA RAMAN on: 10/15/2024 08:36 AM   Modules accepted: Orders

## 2024-10-30 NOTE — Telephone Encounter (Signed)
 I mailed pt mychart message that was not read regarding the cerefolin NAC sent to [facility].

## 2024-11-07 ENCOUNTER — Other Ambulatory Visit (HOSPITAL_COMMUNITY): Payer: Self-pay

## 2024-11-15 ENCOUNTER — Telehealth: Payer: Self-pay | Admitting: Internal Medicine

## 2024-11-15 NOTE — Telephone Encounter (Signed)
 Reached out to patient for recall. Pts daughter [name] wanted office to know pt is in hospice care and will not need f/u. Recall deleted.

## 2024-12-28 ENCOUNTER — Other Ambulatory Visit (HOSPITAL_COMMUNITY): Payer: Self-pay

## 2025-01-06 DEATH — deceased

## 2025-02-18 ENCOUNTER — Ambulatory Visit: Admitting: Neurology
